# Patient Record
Sex: Male | Born: 1935 | Race: White | Hispanic: No | Marital: Married | Smoking: Former smoker
Health system: Southern US, Community
[De-identification: ages and names within clinical notes are randomized; demographics above are authoritative.]

## PROBLEM LIST (undated history)

## (undated) DIAGNOSIS — I509 Heart failure, unspecified: Secondary | ICD-10-CM

## (undated) DIAGNOSIS — Z860100 Personal history of colon polyps, unspecified: Secondary | ICD-10-CM

## (undated) DIAGNOSIS — Z87442 Personal history of urinary calculi: Secondary | ICD-10-CM

## (undated) DIAGNOSIS — C4490 Unspecified malignant neoplasm of skin, unspecified: Secondary | ICD-10-CM

## (undated) DIAGNOSIS — E785 Hyperlipidemia, unspecified: Secondary | ICD-10-CM

## (undated) DIAGNOSIS — N189 Chronic kidney disease, unspecified: Secondary | ICD-10-CM

## (undated) DIAGNOSIS — I10 Essential (primary) hypertension: Secondary | ICD-10-CM

## (undated) DIAGNOSIS — I639 Cerebral infarction, unspecified: Secondary | ICD-10-CM

## (undated) DIAGNOSIS — I5030 Unspecified diastolic (congestive) heart failure: Secondary | ICD-10-CM

## (undated) DIAGNOSIS — K219 Gastro-esophageal reflux disease without esophagitis: Secondary | ICD-10-CM

## (undated) DIAGNOSIS — Z8673 Personal history of transient ischemic attack (TIA), and cerebral infarction without residual deficits: Secondary | ICD-10-CM

## (undated) DIAGNOSIS — K5792 Diverticulitis of intestine, part unspecified, without perforation or abscess without bleeding: Secondary | ICD-10-CM

## (undated) DIAGNOSIS — C679 Malignant neoplasm of bladder, unspecified: Secondary | ICD-10-CM

## (undated) DIAGNOSIS — I739 Peripheral vascular disease, unspecified: Secondary | ICD-10-CM

## (undated) DIAGNOSIS — I2510 Atherosclerotic heart disease of native coronary artery without angina pectoris: Secondary | ICD-10-CM

## (undated) DIAGNOSIS — D649 Anemia, unspecified: Secondary | ICD-10-CM

## (undated) DIAGNOSIS — N529 Male erectile dysfunction, unspecified: Secondary | ICD-10-CM

## (undated) HISTORY — DX: Gastro-esophageal reflux disease without esophagitis: K21.9

## (undated) HISTORY — DX: Atherosclerotic heart disease of native coronary artery without angina pectoris: I25.10

## (undated) HISTORY — DX: Male erectile dysfunction, unspecified: N52.9

## (undated) HISTORY — PX: TRANSURETHRAL RESECTION OF PROSTATE: SHX73

## (undated) HISTORY — DX: Personal history of colon polyps, unspecified: Z86.0100

## (undated) HISTORY — DX: Chronic kidney disease, unspecified: N18.9

## (undated) HISTORY — DX: Personal history of colonic polyps: Z86.010

## (undated) HISTORY — DX: Essential (primary) hypertension: I10

## (undated) HISTORY — DX: Personal history of transient ischemic attack (TIA), and cerebral infarction without residual deficits: Z86.73

## (undated) HISTORY — PX: APPENDECTOMY: SHX54

## (undated) HISTORY — DX: Hyperlipidemia, unspecified: E78.5

## (undated) HISTORY — DX: Unspecified diastolic (congestive) heart failure: I50.30

## (undated) HISTORY — PX: OTHER SURGICAL HISTORY: SHX169

## (undated) HISTORY — DX: Peripheral vascular disease, unspecified: I73.9

## (undated) HISTORY — DX: Diverticulitis of intestine, part unspecified, without perforation or abscess without bleeding: K57.92

## (undated) HISTORY — PX: SKIN CANCER EXCISION: SHX779

---

## 1999-11-29 ENCOUNTER — Encounter: Admission: RE | Admit: 1999-11-29 | Discharge: 1999-11-29 | Payer: Self-pay | Admitting: *Deleted

## 1999-11-29 ENCOUNTER — Encounter: Payer: Self-pay | Admitting: *Deleted

## 2000-06-28 ENCOUNTER — Encounter: Admission: RE | Admit: 2000-06-28 | Discharge: 2000-06-28 | Payer: Self-pay | Admitting: *Deleted

## 2000-06-28 ENCOUNTER — Encounter: Payer: Self-pay | Admitting: *Deleted

## 2000-08-30 ENCOUNTER — Ambulatory Visit (HOSPITAL_COMMUNITY): Admission: RE | Admit: 2000-08-30 | Discharge: 2000-08-30 | Payer: Self-pay | Admitting: Gastroenterology

## 2001-11-19 ENCOUNTER — Encounter: Payer: Self-pay | Admitting: *Deleted

## 2001-11-19 ENCOUNTER — Encounter: Admission: RE | Admit: 2001-11-19 | Discharge: 2001-11-19 | Payer: Self-pay | Admitting: *Deleted

## 2003-09-25 ENCOUNTER — Emergency Department (HOSPITAL_COMMUNITY): Admission: EM | Admit: 2003-09-25 | Discharge: 2003-09-26 | Payer: Self-pay | Admitting: Emergency Medicine

## 2003-12-06 ENCOUNTER — Emergency Department (HOSPITAL_COMMUNITY): Admission: EM | Admit: 2003-12-06 | Discharge: 2003-12-06 | Payer: Self-pay | Admitting: Emergency Medicine

## 2003-12-15 ENCOUNTER — Inpatient Hospital Stay (HOSPITAL_COMMUNITY): Admission: RE | Admit: 2003-12-15 | Discharge: 2003-12-16 | Payer: Self-pay | Admitting: Urology

## 2005-10-06 ENCOUNTER — Emergency Department (HOSPITAL_COMMUNITY): Admission: EM | Admit: 2005-10-06 | Discharge: 2005-10-06 | Payer: Self-pay | Admitting: Emergency Medicine

## 2005-10-12 ENCOUNTER — Encounter: Admission: RE | Admit: 2005-10-12 | Discharge: 2005-10-12 | Payer: Self-pay | Admitting: Gastroenterology

## 2005-10-15 ENCOUNTER — Emergency Department (HOSPITAL_COMMUNITY): Admission: EM | Admit: 2005-10-15 | Discharge: 2005-10-15 | Payer: Self-pay | Admitting: Emergency Medicine

## 2005-12-28 ENCOUNTER — Encounter: Admission: RE | Admit: 2005-12-28 | Discharge: 2005-12-28 | Payer: Self-pay | Admitting: Internal Medicine

## 2010-07-31 ENCOUNTER — Encounter: Payer: Self-pay | Admitting: Gastroenterology

## 2013-08-08 DIAGNOSIS — Z6830 Body mass index (BMI) 30.0-30.9, adult: Secondary | ICD-10-CM | POA: Diagnosis not present

## 2013-08-08 DIAGNOSIS — Z23 Encounter for immunization: Secondary | ICD-10-CM | POA: Diagnosis not present

## 2013-08-08 DIAGNOSIS — F411 Generalized anxiety disorder: Secondary | ICD-10-CM | POA: Diagnosis not present

## 2013-08-08 DIAGNOSIS — I10 Essential (primary) hypertension: Secondary | ICD-10-CM | POA: Diagnosis not present

## 2013-08-29 DIAGNOSIS — Z789 Other specified health status: Secondary | ICD-10-CM | POA: Diagnosis not present

## 2013-08-29 DIAGNOSIS — I10 Essential (primary) hypertension: Secondary | ICD-10-CM | POA: Diagnosis not present

## 2013-08-29 DIAGNOSIS — Z6830 Body mass index (BMI) 30.0-30.9, adult: Secondary | ICD-10-CM | POA: Diagnosis not present

## 2013-10-09 DIAGNOSIS — H2510 Age-related nuclear cataract, unspecified eye: Secondary | ICD-10-CM | POA: Diagnosis not present

## 2013-10-09 DIAGNOSIS — H35349 Macular cyst, hole, or pseudohole, unspecified eye: Secondary | ICD-10-CM | POA: Diagnosis not present

## 2013-10-09 DIAGNOSIS — Z961 Presence of intraocular lens: Secondary | ICD-10-CM | POA: Diagnosis not present

## 2013-10-15 DIAGNOSIS — H2510 Age-related nuclear cataract, unspecified eye: Secondary | ICD-10-CM | POA: Diagnosis not present

## 2013-10-16 DIAGNOSIS — Z6830 Body mass index (BMI) 30.0-30.9, adult: Secondary | ICD-10-CM | POA: Diagnosis not present

## 2013-10-16 DIAGNOSIS — R109 Unspecified abdominal pain: Secondary | ICD-10-CM | POA: Diagnosis not present

## 2013-10-16 DIAGNOSIS — Z789 Other specified health status: Secondary | ICD-10-CM | POA: Diagnosis not present

## 2013-10-22 DIAGNOSIS — H2510 Age-related nuclear cataract, unspecified eye: Secondary | ICD-10-CM | POA: Diagnosis not present

## 2013-11-25 DIAGNOSIS — H6980 Other specified disorders of Eustachian tube, unspecified ear: Secondary | ICD-10-CM | POA: Diagnosis not present

## 2013-11-25 DIAGNOSIS — H908 Mixed conductive and sensorineural hearing loss, unspecified: Secondary | ICD-10-CM | POA: Diagnosis not present

## 2013-11-25 DIAGNOSIS — H902 Conductive hearing loss, unspecified: Secondary | ICD-10-CM | POA: Diagnosis not present

## 2013-11-25 DIAGNOSIS — H6120 Impacted cerumen, unspecified ear: Secondary | ICD-10-CM | POA: Diagnosis not present

## 2013-11-25 DIAGNOSIS — H9120 Sudden idiopathic hearing loss, unspecified ear: Secondary | ICD-10-CM | POA: Diagnosis not present

## 2013-11-25 DIAGNOSIS — H93299 Other abnormal auditory perceptions, unspecified ear: Secondary | ICD-10-CM | POA: Diagnosis not present

## 2013-11-25 DIAGNOSIS — H6500 Acute serous otitis media, unspecified ear: Secondary | ICD-10-CM | POA: Diagnosis not present

## 2013-11-28 ENCOUNTER — Emergency Department (HOSPITAL_COMMUNITY)
Admission: EM | Admit: 2013-11-28 | Discharge: 2013-11-28 | Disposition: A | Discharge status: Home / Self Care | Payer: Medicare Other | Attending: Emergency Medicine | Admitting: Emergency Medicine

## 2013-11-28 ENCOUNTER — Emergency Department (HOSPITAL_COMMUNITY): Payer: Medicare Other

## 2013-11-28 ENCOUNTER — Encounter (HOSPITAL_COMMUNITY): Payer: Self-pay | Admitting: Emergency Medicine

## 2013-11-28 DIAGNOSIS — Z8639 Personal history of other endocrine, nutritional and metabolic disease: Secondary | ICD-10-CM | POA: Insufficient documentation

## 2013-11-28 DIAGNOSIS — R109 Unspecified abdominal pain: Secondary | ICD-10-CM | POA: Diagnosis not present

## 2013-11-28 DIAGNOSIS — R52 Pain, unspecified: Secondary | ICD-10-CM | POA: Diagnosis not present

## 2013-11-28 DIAGNOSIS — F17200 Nicotine dependence, unspecified, uncomplicated: Secondary | ICD-10-CM | POA: Insufficient documentation

## 2013-11-28 DIAGNOSIS — G8929 Other chronic pain: Secondary | ICD-10-CM | POA: Diagnosis not present

## 2013-11-28 DIAGNOSIS — M549 Dorsalgia, unspecified: Secondary | ICD-10-CM

## 2013-11-28 DIAGNOSIS — Z87442 Personal history of urinary calculi: Secondary | ICD-10-CM | POA: Insufficient documentation

## 2013-11-28 DIAGNOSIS — Z8719 Personal history of other diseases of the digestive system: Secondary | ICD-10-CM | POA: Insufficient documentation

## 2013-11-28 DIAGNOSIS — N281 Cyst of kidney, acquired: Secondary | ICD-10-CM | POA: Diagnosis not present

## 2013-11-28 DIAGNOSIS — Z862 Personal history of diseases of the blood and blood-forming organs and certain disorders involving the immune mechanism: Secondary | ICD-10-CM | POA: Insufficient documentation

## 2013-11-28 LAB — URINALYSIS, ROUTINE W REFLEX MICROSCOPIC
Bilirubin Urine: NEGATIVE
Glucose, UA: NEGATIVE mg/dL
Hgb urine dipstick: NEGATIVE
Ketones, ur: NEGATIVE mg/dL
Leukocytes, UA: NEGATIVE
Nitrite: NEGATIVE
Protein, ur: 30 mg/dL — AB
Specific Gravity, Urine: 1.016 (ref 1.005–1.030)
Urobilinogen, UA: 0.2 mg/dL (ref 0.0–1.0)
pH: 5 (ref 5.0–8.0)

## 2013-11-28 LAB — COMPREHENSIVE METABOLIC PANEL
ALT: 18 U/L (ref 0–53)
AST: 27 U/L (ref 0–37)
Albumin: 4 g/dL (ref 3.5–5.2)
Alkaline Phosphatase: 55 U/L (ref 39–117)
BUN: 28 mg/dL — ABNORMAL HIGH (ref 6–23)
CO2: 23 mEq/L (ref 19–32)
Calcium: 9.4 mg/dL (ref 8.4–10.5)
Chloride: 98 mEq/L (ref 96–112)
Creatinine, Ser: 1.15 mg/dL (ref 0.50–1.35)
GFR calc Af Amer: 69 mL/min — ABNORMAL LOW (ref >90)
GFR calc non Af Amer: 60 mL/min — ABNORMAL LOW (ref >90)
Glucose, Bld: 157 mg/dL — ABNORMAL HIGH (ref 70–99)
Potassium: 4.4 mEq/L (ref 3.7–5.3)
Sodium: 137 mEq/L (ref 137–147)
Total Bilirubin: 0.3 mg/dL (ref 0.3–1.2)
Total Protein: 7.3 g/dL (ref 6.0–8.3)

## 2013-11-28 LAB — CBC WITH DIFFERENTIAL/PLATELET
Basophils Absolute: 0 10*3/uL (ref 0.0–0.1)
Basophils Relative: 0 % (ref 0–1)
Eosinophils Absolute: 0 10*3/uL (ref 0.0–0.7)
Eosinophils Relative: 0 % (ref 0–5)
HCT: 34.3 % — ABNORMAL LOW (ref 39.0–52.0)
Hemoglobin: 11.7 g/dL — ABNORMAL LOW (ref 13.0–17.0)
Lymphocytes Relative: 12 % (ref 12–46)
Lymphs Abs: 1.1 10*3/uL (ref 0.7–4.0)
MCH: 33.1 pg (ref 26.0–34.0)
MCHC: 34.1 g/dL (ref 30.0–36.0)
MCV: 96.9 fL (ref 78.0–100.0)
Monocytes Absolute: 0.2 10*3/uL (ref 0.1–1.0)
Monocytes Relative: 2 % — ABNORMAL LOW (ref 3–12)
Neutro Abs: 8.3 10*3/uL — ABNORMAL HIGH (ref 1.7–7.7)
Neutrophils Relative %: 86 % — ABNORMAL HIGH (ref 43–77)
Platelets: 224 10*3/uL (ref 150–400)
RBC: 3.54 MIL/uL — ABNORMAL LOW (ref 4.22–5.81)
RDW: 13.3 % (ref 11.5–15.5)
WBC: 9.7 10*3/uL (ref 4.0–10.5)

## 2013-11-28 LAB — URINE MICROSCOPIC-ADD ON

## 2013-11-28 LAB — I-STAT TROPONIN, ED: Troponin i, poc: 0.01 ng/mL (ref 0.00–0.08)

## 2013-11-28 MED ORDER — MORPHINE SULFATE 4 MG/ML IJ SOLN
6.0000 mg | INTRAMUSCULAR | Status: DC | PRN
Start: 2013-11-28 — End: 2013-11-29
  Administered 2013-11-28: 6 mg via INTRAVENOUS
  Filled 2013-11-28: qty 2

## 2013-11-28 NOTE — ED Notes (Signed)
Pt reports that he developed mid-back pain one hour before arrival. Pt also complaints of intermittent right sided abdominal pain that also developed after the back pain. Pt denies shortness of breath, nausea, emesis, dizziness, or lightheadedness. Pt is A/O x4 and in NAD.

## 2013-11-28 NOTE — ED Notes (Signed)
EKG given to EDP,Allen,MD., for review. 

## 2013-11-28 NOTE — ED Provider Notes (Signed)
CSN: [id number]     Arrival date & time 11/28/13  1932 History   First MD Initiated Contact with Patient 11/28/13 2006     Chief Complaint  Patient presents with  . Back Pain  . Abdominal Pain      HPI Patient reports developing acute mid left back pain approximately one hour before arrival.  His family reports that he stood up immediately from the table and became pale.  He also reported some intermittent left-sided abdominal pain that developed after the back pain.  Denies nausea vomiting.  No chest pain or shortness of breath.  No lightheadedness.  Patient reports the pain resolved on arrival of emergency department.  He does have a history of kidney stones.  No history of AAA   Past Medical History  Diagnosis Date  . HLD (hyperlipidemia)   . Acid reflux   . Hiatal hernia    Past Surgical History  Procedure Laterality Date  . Cataract surgery     Family History  Problem Relation Age of Onset  . Stroke Father 34  . Unexplained death Mother 22    Unknown causes   History  Substance Use Topics  . Smoking status: Current Some Day Smoker    Types: Cigars  . Smokeless tobacco: Not on file  . Alcohol Use: Yes     Comment: ocassionally    Review of Systems  All other systems reviewed and are negative.     Allergies  Review of patient's allergies indicates no known allergies.  Home Medications   Prior to Admission medications   Medication Sig Start Date End Date Taking? Authorizing Provider  atropine 1 % ophthalmic solution  10/19/13   Historical Provider, MD  clidinium-chlordiazePOXIDE (LIBRAX) 5-2.5 MG per capsule  11/18/13   Historical Provider, MD  lisinopril (PRINIVIL,ZESTRIL) 20 MG tablet  11/02/13   Historical Provider, MD  ofloxacin (OCUFLOX) 0.3 % ophthalmic solution  10/19/13   Historical Provider, MD  predniSONE (DELTASONE) 10 MG tablet  11/25/13   Historical Provider, MD  PROLENSA 0.07 % SOLN  10/20/13   Historical Provider, MD   BP 135/66  Pulse 57   Temp(Src) 97.9 F (36.6 C) (Oral)  Resp 13  SpO2 96% Physical Exam  Nursing note and vitals reviewed. Constitutional: He is oriented to person, place, and time. He appears well-developed and well-nourished.  HENT:  Head: Normocephalic and atraumatic.  Eyes: EOM are normal.  Neck: Normal range of motion.  Cardiovascular: Normal rate, regular rhythm, normal heart sounds and intact distal pulses.   Pulmonary/Chest: Effort normal and breath sounds normal. No respiratory distress.  Abdominal: Soft. He exhibits no distension. There is no tenderness.  Musculoskeletal: Normal range of motion.  Neurological: He is alert and oriented to person, place, and time.  Skin: Skin is warm and dry.  Psychiatric: He has a normal mood and affect. Judgment normal.    ED Course  Procedures (including critical care time)  EMERGENCY DEPARTMENT [place] ABD/AORTA EXAM Study: Limited Ultrasound of the Abdominal Aorta. INDICATIONS:Abdominal pain and Back pain Indication: Multiple views of the abdominal aorta are obtained from the diaphragmatic hiatus to the aortic bifurcation in transverse and sagittal planes with a multi- Frequency probe. PERFORMED BY: Myself IMAGES ARCHIVED?: Yes FINDINGS: Maximum aortic dimensions are 2.7cm LIMITATIONS:  none INTERPRETATION:  No abdominal aortic aneurysm COMMENT:    Labs Review Labs Reviewed  CBC WITH DIFFERENTIAL - Abnormal; Notable for the following:    RBC 3.54 (*)    Hemoglobin 11.7 (*)  HCT 34.3 (*)    Neutrophils Relative % 86 (*)    Neutro Abs 8.3 (*)    Monocytes Relative 2 (*)    All other components within normal limits  COMPREHENSIVE METABOLIC PANEL - Abnormal; Notable for the following:    Glucose, Bld 157 (*)    BUN 28 (*)    GFR calc non Af Amer 60 (*)    GFR calc Af Amer 69 (*)    All other components within normal limits  URINALYSIS, ROUTINE W REFLEX MICROSCOPIC - Abnormal; Notable for the following:    Protein, ur 30 (*)    All other  components within normal limits  URINE MICROSCOPIC-ADD ON  I-STAT TROPOININ, ED    Imaging Review Ct Abdomen Pelvis Wo Contrast  11/28/2013   CLINICAL DATA:  Back pain. Left flank pain. History of kidney stones.  EXAM: CT ABDOMEN AND PELVIS WITHOUT CONTRAST  TECHNIQUE: Multidetector CT imaging of the abdomen and pelvis was performed following the standard protocol without IV contrast.  COMPARISON:  CT abdomen and pelvis with contrast 10/12/2005  FINDINGS: Lung bases: Coronary artery atherosclerotic calcifications. Borderline cardiomegaly. Mild bibasilar atelectasis. No evidence for pleural or pericardial effusion.  There is mild atrophy of the right kidney. Negative for stone or hydronephrosis on the right. Mild nonspecific perinephric stranding on the right appears similar prior exam.  No evidence of atrophy of the left kidney. Small exophytic cyst midpole left kidney appears similar to prior exam. Peripherally calcified cyst extending eccentrically from the lower pole the left kidney has significantly decreased in size since the CT of 2007. Currently it measures 4.2 cm greatest diameter. Previously, in 2007, it measured 8.4 cm in greatest diameter. Negative forearm stone or hydronephrosis on the left.  Both ureters are normal in caliber. Negative for ureteral stone or periureteric stranding. The urinary bladder is not very distended at the time imaging and is normal. Normal size prostate gland.  Noncontrast appearance of the liver, gallbladder, spleen, adrenal glands, and pancreas is within normal limits.  Stomach and small bowel loops are normal. Moderate amount of stool in the colon. No evidence of ball thickening or obstruction.  Heavy atherosclerotic calcification of the abdominal aorta and iliac vasculature without aneurysm. There is atherosclerotic calcification scattered in the superior mesenteric artery. Atherosclerotic calcification of the origin of both renal arteries.  The mesenteries appear  normal. Negative for lymphadenopathy, free fluid, or free air.  There is disc height narrowing with posterior disc bulge at L5-S1. Vertebral bodies are normal in height and alignment. No acute or suspicious bony abnormality.  IMPRESSION: 1. Negative for urinary tract stone disease or  obstruction. 2. Left renal cyst. Calcified lower pole renal cyst has significantly decreased in size since prior study of 2007. 3. Extensive atherosclerosis without aneurysm. 4. No definite acute findings identified in the abdomen or pelvis. 5. Degenerative disc disease at L5-S1.   Electronically Signed   By: Curlene Dolphin M.D.   On: 11/28/2013 21:14  I personally reviewed the imaging tests through PACS system I reviewed available ER/hospitalization records through the EMR    EKG Interpretation   Date/Time:  Friday Nov 28 2013 19:40:35 EDT Ventricular Rate:  73 PR Interval:  175 QRS Duration: 103 QT Interval:  393 QTC Calculation: 433 R Axis:   38 Text Interpretation:  Sinus rhythm Atrial premature complex Minimal ST  elevation, inferior leads Baseline wander in lead(s) V2 No significant  change was found Confirmed by Ahnesti Townsend  MD, Thia Olesen ([id number]) on  11/28/2013  8:26:43 PM      MDM   Final diagnoses:  Acute back pain    10:07 PM Patient feels much better at this time. Discharge home in good condition.  His wife does report that he did a bunch of yard work today.  His may represented acute muscle spasm that resolved on arrival to emergency department.  Also may represented recently passed left ureteral stone prior to CT imaging.  Patient feels better.  No complaints at this time.  Repeat abdominal exam is benign.  Discharge home in good condition.    Hoy Morn, MD 11/28/13 2207

## 2013-12-16 DIAGNOSIS — H908 Mixed conductive and sensorineural hearing loss, unspecified: Secondary | ICD-10-CM | POA: Diagnosis not present

## 2013-12-16 DIAGNOSIS — H6500 Acute serous otitis media, unspecified ear: Secondary | ICD-10-CM | POA: Diagnosis not present

## 2013-12-25 DIAGNOSIS — H6520 Chronic serous otitis media, unspecified ear: Secondary | ICD-10-CM | POA: Diagnosis not present

## 2014-01-06 ENCOUNTER — Other Ambulatory Visit: Payer: Self-pay | Admitting: Dermatology

## 2014-01-06 DIAGNOSIS — L821 Other seborrheic keratosis: Secondary | ICD-10-CM | POA: Diagnosis not present

## 2014-01-06 DIAGNOSIS — Z85828 Personal history of other malignant neoplasm of skin: Secondary | ICD-10-CM | POA: Diagnosis not present

## 2014-01-06 DIAGNOSIS — D2330 Other benign neoplasm of skin of unspecified part of face: Secondary | ICD-10-CM | POA: Diagnosis not present

## 2014-01-06 DIAGNOSIS — L570 Actinic keratosis: Secondary | ICD-10-CM | POA: Diagnosis not present

## 2014-01-19 DIAGNOSIS — H9120 Sudden idiopathic hearing loss, unspecified ear: Secondary | ICD-10-CM | POA: Diagnosis not present

## 2014-01-19 DIAGNOSIS — H6520 Chronic serous otitis media, unspecified ear: Secondary | ICD-10-CM | POA: Diagnosis not present

## 2014-01-19 DIAGNOSIS — H905 Unspecified sensorineural hearing loss: Secondary | ICD-10-CM | POA: Diagnosis not present

## 2014-01-19 DIAGNOSIS — H903 Sensorineural hearing loss, bilateral: Secondary | ICD-10-CM | POA: Diagnosis not present

## 2014-01-19 DIAGNOSIS — H908 Mixed conductive and sensorineural hearing loss, unspecified: Secondary | ICD-10-CM | POA: Diagnosis not present

## 2014-02-23 DIAGNOSIS — Z961 Presence of intraocular lens: Secondary | ICD-10-CM | POA: Diagnosis not present

## 2014-04-01 DIAGNOSIS — Z23 Encounter for immunization: Secondary | ICD-10-CM | POA: Diagnosis not present

## 2014-07-07 DIAGNOSIS — L570 Actinic keratosis: Secondary | ICD-10-CM | POA: Diagnosis not present

## 2014-07-07 DIAGNOSIS — D1801 Hemangioma of skin and subcutaneous tissue: Secondary | ICD-10-CM | POA: Diagnosis not present

## 2014-07-07 DIAGNOSIS — L821 Other seborrheic keratosis: Secondary | ICD-10-CM | POA: Diagnosis not present

## 2014-07-07 DIAGNOSIS — D225 Melanocytic nevi of trunk: Secondary | ICD-10-CM | POA: Diagnosis not present

## 2014-07-07 DIAGNOSIS — Z85828 Personal history of other malignant neoplasm of skin: Secondary | ICD-10-CM | POA: Diagnosis not present

## 2014-07-14 DIAGNOSIS — H6981 Other specified disorders of Eustachian tube, right ear: Secondary | ICD-10-CM | POA: Diagnosis not present

## 2014-07-14 DIAGNOSIS — H905 Unspecified sensorineural hearing loss: Secondary | ICD-10-CM | POA: Diagnosis not present

## 2014-07-17 DIAGNOSIS — E785 Hyperlipidemia, unspecified: Secondary | ICD-10-CM | POA: Diagnosis not present

## 2014-07-17 DIAGNOSIS — Z6830 Body mass index (BMI) 30.0-30.9, adult: Secondary | ICD-10-CM | POA: Diagnosis not present

## 2014-07-17 DIAGNOSIS — I10 Essential (primary) hypertension: Secondary | ICD-10-CM | POA: Diagnosis not present

## 2014-11-19 DIAGNOSIS — H6121 Impacted cerumen, right ear: Secondary | ICD-10-CM | POA: Diagnosis not present

## 2014-11-19 DIAGNOSIS — H905 Unspecified sensorineural hearing loss: Secondary | ICD-10-CM | POA: Diagnosis not present

## 2015-03-22 DIAGNOSIS — Z961 Presence of intraocular lens: Secondary | ICD-10-CM | POA: Diagnosis not present

## 2015-03-22 DIAGNOSIS — H35342 Macular cyst, hole, or pseudohole, left eye: Secondary | ICD-10-CM | POA: Diagnosis not present

## 2015-04-20 ENCOUNTER — Ambulatory Visit (INDEPENDENT_AMBULATORY_CARE_PROVIDER_SITE_OTHER): Payer: Medicare Other | Admitting: Family

## 2015-04-20 ENCOUNTER — Other Ambulatory Visit (INDEPENDENT_AMBULATORY_CARE_PROVIDER_SITE_OTHER): Payer: Medicare Other

## 2015-04-20 ENCOUNTER — Encounter: Payer: Self-pay | Admitting: Family

## 2015-04-20 VITALS — BP 142/88 | HR 61 | Temp 97.6°F | Resp 18 | Ht 64.0 in | Wt 166.4 lb

## 2015-04-20 DIAGNOSIS — I10 Essential (primary) hypertension: Secondary | ICD-10-CM | POA: Diagnosis not present

## 2015-04-20 DIAGNOSIS — Z0000 Encounter for general adult medical examination without abnormal findings: Secondary | ICD-10-CM | POA: Diagnosis not present

## 2015-04-20 DIAGNOSIS — N529 Male erectile dysfunction, unspecified: Secondary | ICD-10-CM | POA: Diagnosis not present

## 2015-04-20 HISTORY — DX: Male erectile dysfunction, unspecified: N52.9

## 2015-04-20 HISTORY — DX: Essential (primary) hypertension: I10

## 2015-04-20 LAB — CBC
HCT: 39.3 % (ref 39.0–52.0)
Hemoglobin: 13.3 g/dL (ref 13.0–17.0)
MCHC: 33.7 g/dL (ref 30.0–36.0)
MCV: 97.8 fl (ref 78.0–100.0)
Platelets: 209 10*3/uL (ref 150.0–400.0)
RBC: 4.02 Mil/uL — ABNORMAL LOW (ref 4.22–5.81)
RDW: 14 % (ref 11.5–15.5)
WBC: 6.7 10*3/uL (ref 4.0–10.5)

## 2015-04-20 LAB — TSH: TSH: 1.96 u[IU]/mL (ref 0.35–4.50)

## 2015-04-20 MED ORDER — TADALAFIL 5 MG PO TABS: 5.0000 mg | ORAL_TABLET | Freq: Every day | ORAL | Status: DC | PRN

## 2015-04-20 NOTE — Assessment & Plan Note (Signed)
Previously diagnosed with erectile dysfunction and maintained on Cialis. Requests a refill of Cialis.

## 2015-04-20 NOTE — Progress Notes (Signed)
Pre visit review using our clinic review tool, if applicable. No additional management support is needed unless otherwise documented below in the visit note. 

## 2015-04-20 NOTE — Assessment & Plan Note (Addendum)
Reviewed and updated patient's medical, surgical, family and social history. Medications and allergies were also reviewed. Basic screenings for depression, activities of daily living, hearing, cognition and safety were performed. Provider list was updated and health plan was provided to the patient.   1) Anticipatory Guidance: Discussed importance of wearing a seatbelt while driving and not texting while driving; changing batteries in smoke detector at least once annually; wearing suntan lotion when outside; eating a balanced and moderate diet; getting physical activity at least 30 minutes per day.  2) Immunizations / Screenings / Labs:  Flu shot updated today. Due for shingles, Prevnar, and Pneumovax. Developed treatment plan to provide all immunizations per patient request. Start with Prevnar. All other immunizations are up-to-date per  recommendations. Obtain CBC, CMET, Lipid profile and TSH.   Overall well exam with cardiovascular risk ractors as identified. Recommend increasing physical activity to 30 minutes most days of the week. Hypertension is adequately controlled with current regimen and below goal of 150/90. Follow up prevention exam in 1 year. Follow up office visit pending blood work.

## 2015-04-20 NOTE — Patient Instructions (Signed)
Thank you for choosing [facility].  Summary/Instructions:  Please schedule an appointment for a nurse visit for your Prevnar (pneumonia) vaccination.  Your prescription(s) have been submitted to your pharmacy or been printed and provided for you. Please take as directed and contact our office if you believe you are having problem(s) with the medication(s) or have any questions.  Please stop by the lab on the basement level of the building for your blood work. Your results will be released to [place] (or called to you) after review, usually within 72 hours after test completion. If any changes need to be made, you will be notified at that same time.  Health Maintenance  Topic Date Due  . TETANUS/TDAP  04/26/1955  . ZOSTAVAX  04/25/1996  . PNA vac Low Risk Adult (1 of 2 - PCV13) 04/25/2001  . INFLUENZA VACCINE  02/08/2015    Health Maintenance, Male A healthy lifestyle and preventative care can promote health and wellness.  Maintain regular health, dental, and eye exams.  Eat a healthy diet. Foods like vegetables, fruits, whole grains, low-fat dairy products, and lean protein foods contain the nutrients you need and are low in calories. Decrease your intake of foods high in solid fats, added sugars, and salt. Get information about a proper diet from your health care provider, if necessary.  Regular physical exercise is one of the most important things you can do for your health. Most adults should get at least 150 minutes of moderate-intensity exercise (any activity that increases your heart rate and causes you to sweat) each week. In addition, most adults need muscle-strengthening exercises on 2 or more days a week.   Maintain a healthy weight. The body mass index (BMI) is a screening tool to identify possible weight problems. It provides an estimate of body fat based on height and weight. Your health care provider can find your BMI and can help you achieve or maintain a healthy  weight. For males 20 years and older:  A BMI below 18.5 is considered underweight.  A BMI of 18.5 to 24.9 is normal.  A BMI of 25 to 29.9 is considered overweight.  A BMI of 30 and above is considered obese.  Maintain normal blood lipids and cholesterol by exercising and minimizing your intake of saturated fat. Eat a balanced diet with plenty of fruits and vegetables. Blood tests for lipids and cholesterol should begin at age 63 and be repeated every 5 years. If your lipid or cholesterol levels are high, you are over age 70, or you are at high risk for heart disease, you may need your cholesterol levels checked more frequently.Ongoing high lipid and cholesterol levels should be treated with medicines if diet and exercise are not working.  If you smoke, find out from your health care provider how to quit. If you do not use tobacco, do not start.  Lung cancer screening is recommended for adults aged 48-80 years who are at high risk for developing lung cancer because of a history of smoking. A yearly low-dose CT scan of the lungs is recommended for people who have at least a 30-pack-year history of smoking and are current smokers or have quit within the past 15 years. A pack year of smoking is smoking an average of 1 pack of cigarettes a day for 1 year (for example, a 30-pack-year history of smoking could mean smoking 1 pack a day for 30 years or 2 packs a day for 15 years). Yearly screening should continue until the  smoker has stopped smoking for at least 15 years. Yearly screening should be stopped for people who develop a health problem that would prevent them from having lung cancer treatment.  If you choose to drink alcohol, do not have more than 2 drinks per day. One drink is considered to be 12 oz (360 mL) of beer, 5 oz (150 mL) of wine, or 1.5 oz (45 mL) of liquor.  Avoid the use of street drugs. Do not share needles with anyone. Ask for help if you need support or instructions about stopping  the use of drugs.  High blood pressure causes heart disease and increases the risk of stroke. High blood pressure is more likely to develop in:  People who have blood pressure in the end of the normal range (100-139/85-89 mm Hg).  People who are overweight or obese.  People who are African American.  If you are 93-76 years of age, have your blood pressure checked every 3-5 years. If you are 62 years of age or older, have your blood pressure checked every year. You should have your blood pressure measured twice--once when you are at a hospital or clinic, and once when you are not at a hospital or clinic. Record the average of the two measurements. To check your blood pressure when you are not at a hospital or clinic, you can use:  An automated blood pressure machine at a pharmacy.  A home blood pressure monitor.  If you are 58-75 years old, ask your health care provider if you should take aspirin to prevent heart disease.  Diabetes screening involves taking a blood sample to check your fasting blood sugar level. This should be done once every 3 years after age 13 if you are at a normal weight and without risk factors for diabetes. Testing should be considered at a younger age or be carried out more frequently if you are overweight and have at least 1 risk factor for diabetes.  Colorectal cancer can be detected and often prevented. Most routine colorectal cancer screening begins at the age of 14 and continues through age 20. However, your health care provider may recommend screening at an earlier age if you have risk factors for colon cancer. On a yearly basis, your health care provider may provide home test kits to check for hidden blood in the stool. A small camera at the end of a tube may be used to directly examine the colon (sigmoidoscopy or colonoscopy) to detect the earliest forms of colorectal cancer. Talk to your health care provider about this at age 45 when routine screening begins. A  direct exam of the colon should be repeated every 5-10 years through age 65, unless early forms of precancerous polyps or small growths are found.  People who are at an increased risk for hepatitis B should be screened for this virus. You are considered at high risk for hepatitis B if:  You were born in a country where hepatitis B occurs often. Talk with your health care provider about which countries are considered high risk.  Your parents were born in a high-risk country and you have not received a shot to protect against hepatitis B (hepatitis B vaccine).  You have HIV or AIDS.  You use needles to inject street drugs.  You live with, or have sex with, someone who has hepatitis B.  You are a man who has sex with other men (MSM).  You get hemodialysis treatment.  You take certain medicines for conditions like  cancer, organ transplantation, and autoimmune conditions.  Hepatitis C blood testing is recommended for all people born from [birth year] through 1963 and any individual with known risk factors for hepatitis C.  Healthy men should no longer receive prostate-specific antigen (PSA) blood tests as part of routine cancer screening. Talk to your health care provider about prostate cancer screening.  Testicular cancer screening is not recommended for adolescents or adult males who have no symptoms. Screening includes self-exam, a health care provider exam, and other screening tests. Consult with your health care provider about any symptoms you have or any concerns you have about testicular cancer.  Practice safe sex. Use condoms and avoid high-risk sexual practices to reduce the spread of sexually transmitted infections (STIs).  You should be screened for STIs, including gonorrhea and chlamydia if:  You are sexually active and are younger than 24 years.  You are older than 24 years, and your health care provider tells you that you are at risk for this type of infection.  Your sexual  activity has changed since you were last screened, and you are at an increased risk for chlamydia or gonorrhea. Ask your health care provider if you are at risk.  If you are at risk of being infected with HIV, it is recommended that you take a prescription medicine daily to prevent HIV infection. This is called pre-exposure prophylaxis (PrEP). You are considered at risk if:  You are a man who has sex with other men (MSM).  You are a heterosexual man who is sexually active with multiple partners.  You take drugs by injection.  You are sexually active with a partner who has HIV.  Talk with your health care provider about whether you are at high risk of being infected with HIV. If you choose to begin PrEP, you should first be tested for HIV. You should then be tested every 3 months for as long as you are taking PrEP.  Use sunscreen. Apply sunscreen liberally and repeatedly throughout the day. You should seek shade when your shadow is shorter than you. Protect yourself by wearing long sleeves, pants, a wide-brimmed hat, and sunglasses year round whenever you are outdoors.  Tell your health care provider of new moles or changes in moles, especially if there is a change in shape or color. Also, tell your health care provider if a mole is larger than the size of a pencil eraser.  A one-time screening for abdominal aortic aneurysm (AAA) and surgical repair of large AAAs by ultrasound is recommended for men aged 63-75 years who are current or former smokers.  Stay current with your vaccines (immunizations).   This information is not intended to replace advice given to you by your health care provider. Make sure you discuss any questions you have with your health care provider.   Document Released: 12/23/2007 Document Revised: 07/17/2014 Document Reviewed: 11/21/2010 Elsevier Interactive Patient Education [employer].

## 2015-04-20 NOTE — Progress Notes (Signed)
Subjective:    Patient ID: Danny [name], male    DOB: Nov 16, 1933, 81 y.o.   MRN: [id number]  Chief Complaint  Patient presents with  . Establish Care    CPE     HPI:  Danny [name] is a 81 y.o. male who presents today for an annual wellness visit.   1) Health Maintenance -   Diet - Averages about 1-3 meals per day consisting of fruits, vegetables, seldom red meat, chicken, [place], and occasional milk. 1-2 cups of caffeine daily.  Exercise - Yard work and house work. No structured exercise.   2) Preventative Exams / Immunizations:  Dental -- Dentures / about 10 years ago  Vision -- Up to date   Health Maintenance  Topic Date Due  . TETANUS/TDAP  04/26/1955  . ZOSTAVAX  04/25/1996  . PNA vac Low Risk Adult (1 of 2 - PCV13) 04/25/2001  . INFLUENZA VACCINE  02/08/2015  Will schedule Prevnar/Shingles    There is no immunization history on file for this patient.   RISK FACTORS  Tobacco History  Smoking status  . Former Smoker  . Types: Cigars  Smokeless tobacco  . Never Used     Cardiac risk factors: advanced age (older than 51 for men, 60 for women), hypertension, male gender and sedentary lifestyle.  Depression Screen  Q1: Over the past two weeks, have you felt down, depressed or hopeless? No  Q2: Over the past two weeks, have you felt little interest or pleasure in doing things? No  Have you lost interest or pleasure in daily life? No  Do you often feel hopeless? No  Do you cry easily over simple problems? No  Activities of Daily Living In your present state of health, do you have any difficulty performing the following activities?:  Driving? No Managing money?  No Feeding yourself? No Getting from bed to chair? No Climbing a flight of stairs? No Preparing food and eating?: No Bathing or showering? No Getting dressed: No Getting to the toilet? No Using the toilet: No Moving around from place to place: No In the past year have you fallen or  had a near fall?:No   Home Safety Has smoke detector and wears seat belts. No firearms. No excess sun exposure. Are there smokers in your home (other than you)?  No Do you feel safe at home?  Yes  Hearing Difficulties: No Do you often ask people to speak up or repeat themselves? No Do you experience ringing or noises in your ears? No  Do you have difficulty understanding soft or whispered voices? No    Cognitive Testing  Alert? Yes   Normal Appearance? Yes  Oriented to person? Yes  Place? Yes   Time? Yes  Recall of three objects?  Yes  Can perform simple calculations? Yes  Displays appropriate judgment? Yes  Can read the correct time from a watch face? Yes  Do you feel that you have a problem with memory? No  Do you often misplace items? No   Advanced Directives have been discussed with the patient? Yes  Current Physicians/Providers and Suppliers  1. Terri Piedra, FNP - Internal medicine  Indicate any recent Medical Services you may have received from other than [facility] providers in the past year (date may be approximate).  All answers were reviewed with the patient and necessary referrals were made:  Mauricio Po, [place]   04/20/2015    No Known Allergies   Outpatient Prescriptions Prior to Visit  Medication Sig Dispense Refill  . clidinium-chlordiazePOXIDE (LIBRAX) 5-2.5 MG per capsule Take 1 capsule by mouth 3 (three) times daily before meals.     Marland Kitchen lisinopril (PRINIVIL,ZESTRIL) 20 MG tablet Take 20 mg by mouth daily.     . predniSONE (DELTASONE) 10 MG tablet Take 10-60 mg by mouth See admin instructions. Prednisone dose pack 60 for five days then 50mg  for five days ; 40mg  for five days; 30mg  for five days ; 20mg  for five days; 10 mg for five days     No facility-administered medications prior to visit.     Past Medical History  Diagnosis Date  . HLD (hyperlipidemia)   . Hypertension   . Kidney stones   . History of colon polyps   . Diverticulitis   . Acid  reflux     hiatal hernia     Past Surgical History  Procedure Laterality Date  . Cataract surgery       Family History  Problem Relation Age of Onset  . Stroke Father 28  . Unexplained death Mother 39    Unknown causes     Social History   Social History  . Marital Status: Married    Spouse Name: N/A  . Number of Children: 1  . Years of Education: 12   Occupational History  . Retired    Social History Main Topics  . Smoking status: Former Smoker    Types: Cigars  . Smokeless tobacco: Never Used  . Alcohol Use: No  . Drug Use: No  . Sexual Activity: Not on file   Other Topics Concern  . Not on file   Social History Narrative     Review of Systems  Constitutional: Denies fever, chills, fatigue, or significant weight gain/loss. HENT: Head: Denies headache or neck pain Ears: Denies changes in hearing, ringing in ears, earache, drainage Nose: Denies discharge, stuffiness, itching, nosebleed, sinus pain Throat: Denies sore throat, hoarseness, dry mouth, sores, thrush Eyes: Denies loss/changes in vision, pain, redness, blurry/double vision, flashing lights Cardiovascular: Denies chest pain/discomfort, tightness, palpitations, shortness of breath with activity, difficulty lying down, swelling, sudden awakening with shortness of breath Respiratory: Denies shortness of breath, cough, sputum production, wheezing Gastrointestinal: Denies dysphasia, heartburn, change in appetite, nausea, change in bowel habits, rectal bleeding, constipation, diarrhea, yellow skin or eyes Genitourinary: Denies frequency, urgency, burning/pain, blood in urine, incontinence, change in urinary strength. Musculoskeletal: Denies muscle/joint pain, stiffness, back pain, redness or swelling of joints, trauma Skin: Denies rashes, lumps, itching, dryness, color changes, or hair/nail changes Neurological: Denies dizziness, fainting, seizures, weakness, numbness, tingling, tremor Psychiatric -  Denies nervousness, stress, depression or memory loss Endocrine: Denies heat or cold intolerance, sweating, frequent urination, excessive thirst, changes in appetite Hematologic: Denies ease of bruising or bleeding    Objective:     BP 142/88 mmHg  Pulse 61  Temp(Src) 97.6 F (36.4 C) (Oral)  Resp 18  Ht 5\' 4"  (1.626 m)  Wt 166 lb 6.4 oz (75.479 kg)  BMI 28.55 kg/m2  SpO2 97% Nursing note and vital signs reviewed.  Physical Exam  Constitutional: He is oriented to person, place, and time. He appears well-developed and well-nourished. No distress.  Cardiovascular: Normal rate, regular rhythm, normal heart sounds and intact distal pulses.   Pulmonary/Chest: Effort normal and breath sounds normal.  Neurological: He is alert and oriented to person, place, and time.  Skin: Skin is warm and dry.  Psychiatric: He has a normal mood and affect. His behavior is normal. Judgment  and thought content normal.       Assessment & Plan:   During the course of the visit the patient was educated and counseled about appropriate screening and preventive services including:    Pneumococcal vaccine   Influenza vaccine  Td vaccine  Prostate cancer screening  Colorectal cancer screening  Diabetes screening  Glaucoma screening  Nutrition counseling   Diet review for nutrition referral? Yes ____  Not Indicated _X___   Patient Instructions (the written plan) was given to the patient.  Medicare Attestation I have personally reviewed: The patient's medical and social history Their use of alcohol, tobacco or illicit drugs Their current medications and supplements The patient's functional ability including ADLs,fall risks, home safety risks, cognitive, and hearing and visual impairment Diet and physical activities Evidence for depression or mood disorders  The patient's weight, height, BMI,  have been recorded in the chart.  I have made referrals, counseling, and provided education to  the patient based on review of the above and I have provided the patient with a written personalized care plan for preventive services.     Mauricio Po, [place]   04/20/2015    Problem List Items Addressed This Visit      Genitourinary   Erectile dysfunction    Previously diagnosed with erectile dysfunction and maintained on Cialis. Requests a refill of Cialis.       Relevant Medications   tadalafil (CIALIS) 5 MG tablet     Other   Medicare annual wellness visit, subsequent - Primary    Reviewed and updated patient's medical, surgical, family and social history. Medications and allergies were also reviewed. Basic screenings for depression, activities of daily living, hearing, cognition and safety were performed. Provider list was updated and health plan was provided to the patient.   1) Anticipatory Guidance: Discussed importance of wearing a seatbelt while driving and not texting while driving; changing batteries in smoke detector at least once annually; wearing suntan lotion when outside; eating a balanced and moderate diet; getting physical activity at least 30 minutes per day.  2) Immunizations / Screenings / Labs:  Flu shot updated today. Due for shingles, Prevnar, and Pneumovax. Developed treatment plan to provide all immunizations per patient request. Start with Prevnar. All other immunizations are up-to-date per  recommendations. Obtain CBC, CMET, Lipid profile and TSH.   Overall well exam with cardiovascular risk ractors as identified. Recommend increasing physical activity to 30 minutes most days of the week. Hypertension is adequately controlled with current regimen and below goal of 150/90. Follow up prevention exam in 1 year. Follow up office visit pending blood work.         Other Visit Diagnoses    Essential hypertension        Relevant Medications    aspirin 81 MG tablet    tadalafil (CIALIS) 5 MG tablet    Other Relevant Orders    Comprehensive metabolic panel      CBC    Lipid panel    TSH

## 2015-04-21 ENCOUNTER — Telehealth: Payer: Self-pay | Admitting: Family

## 2015-04-21 LAB — LIPID PANEL
Cholesterol: 298 mg/dL — ABNORMAL HIGH (ref 0–200)
HDL: 47.5 mg/dL (ref >39.00)
LDL Cholesterol: 221 mg/dL — ABNORMAL HIGH (ref 0–99)
NonHDL: 250.99
Total CHOL/HDL Ratio: 6
Triglycerides: 149 mg/dL (ref 0.0–149.0)
VLDL: 29.8 mg/dL (ref 0.0–40.0)

## 2015-04-21 LAB — COMPREHENSIVE METABOLIC PANEL
ALT: 12 U/L (ref 0–53)
AST: 19 U/L (ref 0–37)
Albumin: 4.2 g/dL (ref 3.5–5.2)
Alkaline Phosphatase: 60 U/L (ref 39–117)
BUN: 15 mg/dL (ref 6–23)
CO2: 30 mEq/L (ref 19–32)
Calcium: 9.7 mg/dL (ref 8.4–10.5)
Chloride: 103 mEq/L (ref 96–112)
Creatinine, Ser: 0.91 mg/dL (ref 0.40–1.50)
GFR: 85.42 mL/min (ref >60.00)
Glucose, Bld: 95 mg/dL (ref 70–99)
Potassium: 4.9 mEq/L (ref 3.5–5.1)
Sodium: 139 mEq/L (ref 135–145)
Total Bilirubin: 0.6 mg/dL (ref 0.2–1.2)
Total Protein: 7.3 g/dL (ref 6.0–8.3)

## 2015-04-21 NOTE — Telephone Encounter (Signed)
Please inform patient that his blood work shows that his white/red blood cells, kidney function, liver function, electrolytes, and thyroid are all within the normal ranges. His cholesterol levels were significantly elevated and is recommended that he start cholesterol medication. With his agreement I will send in Lipitor to his pharmacy. If he experiences muscle pain with this medication then we will need to make changes.

## 2015-04-22 NOTE — Telephone Encounter (Signed)
Would like to be back on cholesterol medication can not take lipitor. Had muscle pain. Would like to see if you could send something reasonably priced. Please advise

## 2015-04-23 NOTE — Telephone Encounter (Signed)
Please have the patient check his drug formulary for options that are covered and approved by his insurance.

## 2015-05-04 ENCOUNTER — Ambulatory Visit: Payer: Medicare Other

## 2015-05-04 DIAGNOSIS — Z23 Encounter for immunization: Secondary | ICD-10-CM

## 2015-05-06 NOTE — Telephone Encounter (Signed)
There is a generic crestor now. Are you interested in the pt trying that?

## 2015-05-10 NOTE — Telephone Encounter (Signed)
What is covered on his formulary as he as had reactions to statins in the past.

## 2015-05-27 NOTE — Progress Notes (Signed)
Cardiology Office Note   Date:  05/28/2015   ID:  Danny [name], DOB [birth year], MRN [id number]  PCP:  Danny Po, FNP  Cardiologist:   Thayer Headings, MD   Chief Complaint  Patient presents with  . Hypertension   Problem List  1. HTN 2. Hyperlipidemia    History of Present Illness: Danny [name] is a 81 y.o. male who presents to re-establish care. I saw him many years ago . Has a hx of HTN and hyperlipidema.  Mows yards, does lots of yard work .    Past Medical History  Diagnosis Date  . HLD (hyperlipidemia)   . Hypertension   . Kidney stones   . History of colon polyps   . Diverticulitis   . Acid reflux     hiatal hernia    Past Surgical History  Procedure Laterality Date  . Cataract surgery       Current Outpatient Prescriptions  Medication Sig Dispense Refill  . aspirin 81 MG tablet Take 81 mg by mouth daily.    . clidinium-chlordiazePOXIDE (LIBRAX) 5-2.5 MG per capsule Take 1 capsule by mouth 3 (three) times daily before meals.     . L-ARGININE [name] Take 850 mg by mouth daily.     . Milk Thistle 500 MG CAPS Take 500 mg by mouth daily.     . tadalafil (CIALIS) 5 MG tablet Take 1 tablet (5 mg total) by mouth daily as needed for erectile dysfunction. 5 tablet 0  . Turmeric 450 MG CAPS Take 720 mg by mouth daily.     Marland Kitchen zoster vaccine live, PF, (ZOSTAVAX) 55374 UNT/0.65ML injection Inject as directed once.    Marland Kitchen losartan (COZAAR) 100 MG tablet Take 1 tablet (100 mg total) by mouth daily. 31 tablet 11  . rosuvastatin (CRESTOR) 10 MG tablet Take 1 tablet (10 mg total) by mouth daily. 31 tablet 11   No current facility-administered medications for this visit.    Allergies:   Lipitor    Social History:  The patient  reports that he has quit smoking. His smoking use included Cigars. He has never used smokeless tobacco. He reports that he does not drink alcohol or use illicit drugs.   Family History:  The patient's family history includes Stroke (age  of onset: 51) in his father; Unexplained death (age of onset: 51) in his mother.    ROS:  Please see the history of present illness.    Review of Systems: Constitutional:  denies fever, chills, diaphoresis, appetite change and fatigue.  HEENT: denies photophobia, eye pain, redness, hearing loss, ear pain, congestion, sore throat, rhinorrhea, sneezing, neck pain, neck stiffness and tinnitus.  Respiratory: denies SOB, DOE, cough, chest tightness, and wheezing.  Cardiovascular: denies chest pain, palpitations and leg swelling.  Gastrointestinal: denies nausea, vomiting, abdominal pain, diarrhea, constipation, blood in stool.  Genitourinary: denies dysuria, urgency, frequency, hematuria, flank pain and difficulty urinating.  Musculoskeletal: denies  myalgias, back pain, joint swelling, arthralgias and gait problem.   Skin: denies pallor, rash and wound.  Neurological: denies dizziness, seizures, syncope, weakness, light-headedness, numbness and headaches.   Hematological: denies adenopathy, easy bruising, personal or family bleeding history.  Psychiatric/ Behavioral: denies suicidal ideation, mood changes, confusion, nervousness, sleep disturbance and agitation.       All other systems are reviewed and negative.    PHYSICAL EXAM: VS:  BP 164/82 mmHg  Pulse 65  Ht 5' 4"  (1.626 m)  Wt 170 lb 9.6 oz (77.384 kg)  BMI 29.27 kg/m2 , BMI Body mass index is 29.27 kg/(m^2). GEN: Well nourished, well developed, in no acute distress HEENT: normal Neck: no JVD, carotid bruits, or masses Cardiac: RRR; no murmurs, rubs, or gallops,no edema  Respiratory:  clear to auscultation bilaterally, normal work of breathing GI: soft, nontender, nondistended, + BS MS: no deformity or atrophy Skin: warm and dry, no rash Neuro:  Strength and sensation are intact Psych: normal   EKG:  EKG is ordered today. The ekg ordered today demonstrates NSR at 65.  Normal ECG   Recent Labs: 04/20/2015: ALT 12; BUN  15; Creatinine, Ser 0.91; Hemoglobin 13.3; Platelets 209.0; Potassium 4.9; Sodium 139; TSH 1.96    Lipid Panel    Component Value Date/Time   CHOL 298* 04/20/2015 1529   TRIG 149.0 04/20/2015 1529   HDL 47.50 04/20/2015 1529   CHOLHDL 6 04/20/2015 1529   VLDL 29.8 04/20/2015 1529   LDLCALC 221* 04/20/2015 1529      Wt Readings from Last 3 Encounters:  05/28/15 170 lb 9.6 oz (77.384 kg)  04/20/15 166 lb 6.4 oz (75.479 kg)      Other studies Reviewed: Additional studies/ records that were reviewed today include: . Review of the above records demonstrates:    ASSESSMENT AND PLAN:  1.  Essential Hypertension:  BPis a little bit high. He's on lisinopril 20 g a day. We will stop the lisinopril and start him on losartan 100 mg a day. We will check a basic metabolic profile in 3 weeks. I've advised him to watch his salt intake.  2.   Hyperlipidemia:   Will restart Crestor .  We'll check fasting labs in 3 months.  I will see him again in 3 months.  Current medicines are reviewed at length with the patient today.  The patient does not have concerns regarding medicines.  The following changes have been made:  no change  Labs/ tests ordered today include:   Orders Placed This Encounter  Procedures  . Basic Metabolic Panel (BMET)  . Comp Met (CMET)  . Lipid Profile  . EKG 12-Lead     Disposition:   FU with me in 3 months      Nahser, Wonda Cheng, MD  05/28/2015 4:17 PM    [facility] [place], [place], [place]  [address] Phone: [phone]; Fax: [phone]   [facility]  [address] [place], [place]  [address] [phone]   Fax [phone]

## 2015-05-28 ENCOUNTER — Ambulatory Visit (INDEPENDENT_AMBULATORY_CARE_PROVIDER_SITE_OTHER): Payer: Medicare Other | Admitting: Cardiovascular Disease

## 2015-05-28 ENCOUNTER — Encounter: Payer: Self-pay | Admitting: Cardiovascular Disease

## 2015-05-28 VITALS — BP 164/82 | HR 65 | Ht 64.0 in | Wt 170.6 lb

## 2015-05-28 DIAGNOSIS — E785 Hyperlipidemia, unspecified: Secondary | ICD-10-CM | POA: Insufficient documentation

## 2015-05-28 DIAGNOSIS — I10 Essential (primary) hypertension: Secondary | ICD-10-CM

## 2015-05-28 HISTORY — DX: Hyperlipidemia, unspecified: E78.5

## 2015-05-28 MED ORDER — ROSUVASTATIN CALCIUM 10 MG PO TABS: 10.0000 mg | ORAL_TABLET | Freq: Every day | ORAL | Status: DC

## 2015-05-28 MED ORDER — LOSARTAN POTASSIUM 100 MG PO TABS: 100.0000 mg | ORAL_TABLET | Freq: Every day | ORAL | Status: DC

## 2015-05-28 NOTE — Patient Instructions (Signed)
Medication Instructions:  STOP Lisinopril  START Losartan 100 mg once daily START Crestor 10 mg once daily   Labwork: Your physician recommends that you return for lab work in: 3 weeks for basic metabolic panel  Your physician recommends that you return for lab work in: 3 months on the day of or a few days before your office visit with Dr. Acie Fredrickson.  You will need to FAST for this appointment - nothing to eat or drink after midnight the night before except water.    Testing/Procedures: None Ordered   Follow-Up: Your physician recommends that you schedule a follow-up appointment in: 3 months   If you need a refill on your cardiac medications before your next appointment, please call your pharmacy.   Thank you for choosing [facility]! Christen Bame, RN [phone]

## 2015-06-18 ENCOUNTER — Other Ambulatory Visit (INDEPENDENT_AMBULATORY_CARE_PROVIDER_SITE_OTHER): Payer: Medicare Other | Admitting: *Deleted

## 2015-06-18 DIAGNOSIS — I10 Essential (primary) hypertension: Secondary | ICD-10-CM

## 2015-06-18 DIAGNOSIS — E785 Hyperlipidemia, unspecified: Secondary | ICD-10-CM | POA: Diagnosis not present

## 2015-06-18 LAB — COMPREHENSIVE METABOLIC PANEL
ALT: 16 U/L (ref 9–46)
AST: 21 U/L (ref 10–35)
Albumin: 4.1 g/dL (ref 3.6–5.1)
Alkaline Phosphatase: 65 U/L (ref 40–115)
BUN: 16 mg/dL (ref 7–25)
CO2: 28 mmol/L (ref 20–31)
Calcium: 9.5 mg/dL (ref 8.6–10.3)
Chloride: 103 mmol/L (ref 98–110)
Creat: 0.93 mg/dL (ref 0.70–1.18)
Glucose, Bld: 67 mg/dL (ref 65–99)
Potassium: 4.7 mmol/L (ref 3.5–5.3)
Sodium: 140 mmol/L (ref 135–146)
Total Bilirubin: 0.5 mg/dL (ref 0.2–1.2)
Total Protein: 7.3 g/dL (ref 6.1–8.1)

## 2015-06-18 LAB — BASIC METABOLIC PANEL
BUN: 16 mg/dL (ref 7–25)
CO2: 28 mmol/L (ref 20–31)
Calcium: 9.5 mg/dL (ref 8.6–10.3)
Chloride: 103 mmol/L (ref 98–110)
Creat: 0.93 mg/dL (ref 0.70–1.18)
Glucose, Bld: 67 mg/dL (ref 65–99)
Potassium: 4.7 mmol/L (ref 3.5–5.3)
Sodium: 140 mmol/L (ref 135–146)

## 2015-06-18 LAB — LIPID PANEL
Cholesterol: 188 mg/dL (ref 125–200)
HDL: 57 mg/dL (ref >=40)
LDL Cholesterol: 115 mg/dL (ref <130)
Total CHOL/HDL Ratio: 3.3 Ratio (ref <=5.0)
Triglycerides: 79 mg/dL (ref <150)
VLDL: 16 mg/dL (ref <30)

## 2015-06-21 ENCOUNTER — Other Ambulatory Visit: Payer: Self-pay | Admitting: Nurse Practitioner

## 2015-06-21 DIAGNOSIS — E785 Hyperlipidemia, unspecified: Secondary | ICD-10-CM

## 2015-06-23 ENCOUNTER — Telehealth: Payer: Self-pay

## 2015-06-23 NOTE — Telephone Encounter (Signed)
Left Voice Mail for pt to call back.   RE: Flu Vaccine for 2016  

## 2015-09-02 ENCOUNTER — Other Ambulatory Visit: Payer: Medicare Other

## 2015-09-03 ENCOUNTER — Other Ambulatory Visit: Payer: Medicare Other

## 2015-09-06 ENCOUNTER — Ambulatory Visit: Payer: Medicare Other | Admitting: Cardiovascular Disease

## 2015-09-14 ENCOUNTER — Other Ambulatory Visit (INDEPENDENT_AMBULATORY_CARE_PROVIDER_SITE_OTHER): Payer: Medicare Other | Admitting: *Deleted

## 2015-09-14 DIAGNOSIS — E785 Hyperlipidemia, unspecified: Secondary | ICD-10-CM

## 2015-09-14 LAB — LIPID PANEL
Cholesterol: 295 mg/dL — ABNORMAL HIGH (ref 125–200)
HDL: 52 mg/dL (ref >=40)
LDL Cholesterol: 218 mg/dL — ABNORMAL HIGH (ref <130)
Total CHOL/HDL Ratio: 5.7 Ratio — ABNORMAL HIGH (ref <=5.0)
Triglycerides: 127 mg/dL (ref <150)
VLDL: 25 mg/dL (ref <30)

## 2015-09-14 LAB — COMPREHENSIVE METABOLIC PANEL
ALT: 15 U/L (ref 9–46)
AST: 19 U/L (ref 10–35)
Albumin: 3.9 g/dL (ref 3.6–5.1)
Alkaline Phosphatase: 63 U/L (ref 40–115)
BUN: 14 mg/dL (ref 7–25)
CO2: 29 mmol/L (ref 20–31)
Calcium: 9.2 mg/dL (ref 8.6–10.3)
Chloride: 104 mmol/L (ref 98–110)
Creat: 0.95 mg/dL (ref 0.70–1.18)
Glucose, Bld: 97 mg/dL (ref 65–99)
Potassium: 4.5 mmol/L (ref 3.5–5.3)
Sodium: 139 mmol/L (ref 135–146)
Total Bilirubin: 0.6 mg/dL (ref 0.2–1.2)
Total Protein: 6.3 g/dL (ref 6.1–8.1)

## 2015-09-17 ENCOUNTER — Ambulatory Visit (INDEPENDENT_AMBULATORY_CARE_PROVIDER_SITE_OTHER): Payer: Medicare Other | Admitting: Cardiovascular Disease

## 2015-09-17 ENCOUNTER — Encounter: Payer: Self-pay | Admitting: Cardiovascular Disease

## 2015-09-17 VITALS — BP 145/90 | HR 70 | Ht 64.0 in | Wt 176.6 lb

## 2015-09-17 DIAGNOSIS — E785 Hyperlipidemia, unspecified: Secondary | ICD-10-CM | POA: Diagnosis not present

## 2015-09-17 DIAGNOSIS — I10 Essential (primary) hypertension: Secondary | ICD-10-CM

## 2015-09-17 MED ORDER — HYDROCHLOROTHIAZIDE 25 MG PO TABS: 25.0000 mg | ORAL_TABLET | Freq: Every day | ORAL | Status: DC

## 2015-09-17 MED ORDER — POTASSIUM CHLORIDE ER 10 MEQ PO TBCR: 10.0000 meq | EXTENDED_RELEASE_TABLET | Freq: Every day | ORAL | Status: DC

## 2015-09-17 NOTE — Progress Notes (Signed)
Cardiology Office Note   Date:  09/17/2015   ID:  Danny [name], DOB 06/03/1934, MRN [id number]  PCP:  Mauricio Po, FNP  Cardiologist:   Thayer Headings, MD   Chief Complaint  Patient presents with  . Hypertension   Problem List  1. HTN 2. Hyperlipidemia    History of Present Illness: Danny [name] is a 82 y.o. male who presents to re-establish care. I saw him many years ago . Has a hx of HTN and hyperlipidema.  Mows yards, does lots of yard work .   September 17, 2015:  BP has been running high. Does not eat salt. Hb has been falling - was 12.8   Past Medical History  Diagnosis Date  . HLD (hyperlipidemia)   . Hypertension   . Kidney stones   . History of colon polyps   . Diverticulitis   . Acid reflux     hiatal hernia    Past Surgical History  Procedure Laterality Date  . Cataract surgery       Current Outpatient Prescriptions  Medication Sig Dispense Refill  . aspirin 81 MG tablet Take 81 mg by mouth daily.    . clidinium-chlordiazePOXIDE (LIBRAX) 5-2.5 MG per capsule Take 1 capsule by mouth 3 (three) times daily before meals.     . L-ARGININE PO Take 850 mg by mouth daily.     Marland Kitchen losartan (COZAAR) 100 MG tablet Take 1 tablet (100 mg total) by mouth daily. 31 tablet 11  . Milk Thistle 500 MG CAPS Take 500 mg by mouth daily.     . rosuvastatin (CRESTOR) 10 MG tablet Take 1 tablet (10 mg total) by mouth daily. 31 tablet 11  . tadalafil (CIALIS) 5 MG tablet Take 1 tablet (5 mg total) by mouth daily as needed for erectile dysfunction. 5 tablet 0  . Turmeric 450 MG CAPS Take 720 mg by mouth daily.     Marland Kitchen zoster vaccine live, PF, (ZOSTAVAX) 28413 UNT/0.65ML injection Inject as directed once.     No current facility-administered medications for this visit.    Allergies:   Lipitor    Social History:  The patient  reports that he has quit smoking. His smoking use included Cigars. He has never used smokeless tobacco. He reports that he does not drink  alcohol or use illicit drugs.   Family History:  The patient's family history includes Stroke (age of onset: 80) in his father; Unexplained death (age of onset: 73) in his mother.    ROS:  Please see the history of present illness.    Review of Systems: Constitutional:  denies fever, chills, diaphoresis, appetite change and fatigue.  HEENT: denies photophobia, eye pain, redness, hearing loss, ear pain, congestion, sore throat, rhinorrhea, sneezing, neck pain, neck stiffness and tinnitus.  Respiratory: denies SOB, DOE, cough, chest tightness, and wheezing.  Cardiovascular: denies chest pain, palpitations and leg swelling.  Gastrointestinal: denies nausea, vomiting, abdominal pain, diarrhea, constipation, blood in stool.  Genitourinary: denies dysuria, urgency, frequency, hematuria, flank pain and difficulty urinating.  Musculoskeletal: denies  myalgias, back pain, joint swelling, arthralgias and gait problem.   Skin: denies pallor, rash and wound.  Neurological: denies dizziness, seizures, syncope, weakness, light-headedness, numbness and headaches.   Hematological: denies adenopathy, easy bruising, personal or family bleeding history.  Psychiatric/ Behavioral: denies suicidal ideation, mood changes, confusion, nervousness, sleep disturbance and agitation.       All other systems are reviewed and negative.    PHYSICAL EXAM: VS:  BP 145/90 mmHg  Pulse 70  Ht 5\' 4"  (1.626 m)  Wt 176 lb 9.6 oz (80.105 kg)  BMI 30.30 kg/m2 , BMI Body mass index is 30.3 kg/(m^2). GEN: Well nourished, well developed, in no acute distress HEENT: normal Neck: no JVD, carotid bruits, or masses Cardiac: RRR; no murmurs, rubs, or gallops,no edema  Respiratory:  clear to auscultation bilaterally, normal work of breathing GI: soft, nontender, nondistended, + BS MS: no deformity or atrophy Skin: warm and dry, no rash Neuro:  Strength and sensation are intact Psych: normal   EKG:  EKG is ordered  today. The ekg ordered today demonstrates NSR at 65.  Normal ECG   Recent Labs: 04/20/2015: Hemoglobin 13.3; Platelets 209.0; TSH 1.96 09/14/2015: ALT 15; BUN 14; Creat 0.95; Potassium 4.5; Sodium 139    Lipid Panel    Component Value Date/Time   CHOL 295* 09/14/2015 1039   TRIG 127 09/14/2015 1039   HDL 52 09/14/2015 1039   CHOLHDL 5.7* 09/14/2015 1039   VLDL 25 09/14/2015 1039   LDLCALC 218* 09/14/2015 1039      Wt Readings from Last 3 Encounters:  09/17/15 176 lb 9.6 oz (80.105 kg)  05/28/15 170 lb 9.6 oz (77.384 kg)  04/20/15 166 lb 6.4 oz (75.479 kg)      Other studies Reviewed: Additional studies/ records that were reviewed today include: . Review of the above records demonstrates:    ASSESSMENT AND PLAN:  1.  Essential Hypertension:  BPis a little bit high.  Will add HCTZ 25 a day and Kdur 10 meq a day . Continue losartan for now   2.   Hyperlipidemia:   Will restart Crestor .  We'll check fasting labs in 3 months.  I will see him again in 6 months.  Current medicines are reviewed at length with the patient today.  The patient does not have concerns regarding medicines.  The following changes have been made:  no change  Labs/ tests ordered today include:   No orders of the defined types were placed in this encounter.     Disposition:   FU with me in 6 months      Dreama Kuna, Wonda Cheng, MD  09/17/2015 3:48 PM    [facility] [place], [place], [place]  [address] Phone: [phone]; Fax: [phone]   [facility]  [address] [place], [place]  [address] [phone]   Fax [phone]

## 2015-09-17 NOTE — Patient Instructions (Addendum)
Medication Instructions:  INCREASE Crestor to 10 mg once daily START Kdur (potassium) 10 meq once daily START HCTZ (hydrochlorothiazide) 25 mg once daily   Labwork: Your physician recommends that you return for lab work in: 3 weeks for basic metabolic panel  Your physician recommends that you return for lab work in: 3 months  You will need to FAST for this appointment - nothing to eat or drink after midnight the night before except water.   Testing/Procedures: None Ordered   Follow-Up: Your physician wants you to follow-up in: 6 months with Dr. Acie Fredrickson.  You will receive a reminder letter in the mail two months in advance. If you don't receive a letter, please call our office to schedule the follow-up appointment.    If you need a refill on your cardiac medications before your next appointment, please call your pharmacy.   Thank you for choosing [facility]! Christen Bame, RN [phone]

## 2015-09-24 ENCOUNTER — Telehealth: Payer: Self-pay

## 2015-09-24 NOTE — Telephone Encounter (Signed)
LVM for pt to call back as soon as possible.   RE: Flu Vaccine 2016-2017

## 2015-10-08 ENCOUNTER — Other Ambulatory Visit (INDEPENDENT_AMBULATORY_CARE_PROVIDER_SITE_OTHER): Payer: Medicare Other | Admitting: *Deleted

## 2015-10-08 DIAGNOSIS — I10 Essential (primary) hypertension: Secondary | ICD-10-CM | POA: Diagnosis not present

## 2015-10-08 LAB — BASIC METABOLIC PANEL
BUN: 23 mg/dL (ref 7–25)
CO2: 27 mmol/L (ref 20–31)
Calcium: 8.7 mg/dL (ref 8.6–10.3)
Chloride: 101 mmol/L (ref 98–110)
Creat: 1.25 mg/dL — ABNORMAL HIGH (ref 0.70–1.18)
Glucose, Bld: 114 mg/dL — ABNORMAL HIGH (ref 65–99)
Potassium: 4.1 mmol/L (ref 3.5–5.3)
Sodium: 137 mmol/L (ref 135–146)

## 2015-12-20 ENCOUNTER — Other Ambulatory Visit (INDEPENDENT_AMBULATORY_CARE_PROVIDER_SITE_OTHER): Payer: Medicare Other | Admitting: *Deleted

## 2015-12-20 DIAGNOSIS — E785 Hyperlipidemia, unspecified: Secondary | ICD-10-CM | POA: Diagnosis not present

## 2015-12-20 LAB — LIPID PANEL
Cholesterol: 186 mg/dL (ref 125–200)
HDL: 52 mg/dL (ref >=40)
LDL Cholesterol: 111 mg/dL (ref <130)
Total CHOL/HDL Ratio: 3.6 Ratio (ref <=5.0)
Triglycerides: 113 mg/dL (ref <150)
VLDL: 23 mg/dL (ref <30)

## 2015-12-20 LAB — COMPREHENSIVE METABOLIC PANEL
ALT: 12 U/L (ref 9–46)
AST: 18 U/L (ref 10–35)
Albumin: 4.2 g/dL (ref 3.6–5.1)
Alkaline Phosphatase: 59 U/L (ref 40–115)
BUN: 24 mg/dL (ref 7–25)
CO2: 24 mmol/L (ref 20–31)
Calcium: 9.4 mg/dL (ref 8.6–10.3)
Chloride: 104 mmol/L (ref 98–110)
Creat: 1.22 mg/dL — ABNORMAL HIGH (ref 0.70–1.18)
Glucose, Bld: 93 mg/dL (ref 65–99)
Potassium: 4.3 mmol/L (ref 3.5–5.3)
Sodium: 139 mmol/L (ref 135–146)
Total Bilirubin: 0.6 mg/dL (ref 0.2–1.2)
Total Protein: 6.7 g/dL (ref 6.1–8.1)

## 2016-03-14 ENCOUNTER — Encounter: Payer: Self-pay | Admitting: Cardiovascular Disease

## 2016-03-28 ENCOUNTER — Encounter: Payer: Self-pay | Admitting: Cardiovascular Disease

## 2016-03-28 ENCOUNTER — Ambulatory Visit (INDEPENDENT_AMBULATORY_CARE_PROVIDER_SITE_OTHER): Payer: Medicare Other | Admitting: Cardiovascular Disease

## 2016-03-28 VITALS — BP 130/70 | HR 66 | Ht 64.0 in | Wt 170.8 lb

## 2016-03-28 DIAGNOSIS — I10 Essential (primary) hypertension: Secondary | ICD-10-CM

## 2016-03-28 DIAGNOSIS — E785 Hyperlipidemia, unspecified: Secondary | ICD-10-CM | POA: Diagnosis not present

## 2016-03-28 NOTE — Progress Notes (Signed)
Cardiology Office Note   Date:  03/28/2016   ID:  Danny [name], DOB 07/25/1933, MRN [id number]  PCP:  Mauricio Po, FNP  Cardiologist:   Mertie Moores, MD   Chief Complaint  Patient presents with  . Hypertension   Problem List  1. HTN 2. Hyperlipidemia    History of Present Illness: Danny [name] is a 82 y.o. male who presents to re-establish care. I saw him many years ago . Has a hx of HTN and hyperlipidema.  Mows yards, does lots of yard work .   September 17, 2015:  BP has been running high. Does not eat salt. Hb has been falling - was 12.8  Sept. 19, 2017:  Staying active.  BP and HR are well controlled . Hx of HTN and hyperlipidemia .  Still anemic.     Past Medical History:  Diagnosis Date  . Acid reflux    hiatal hernia  . Diverticulitis   . History of colon polyps   . HLD (hyperlipidemia)   . Hypertension   . Kidney stones     Past Surgical History:  Procedure Laterality Date  . cataract surgery       Current Outpatient Prescriptions  Medication Sig Dispense Refill  . aspirin 81 MG tablet Take 81 mg by mouth daily.    . clidinium-chlordiazePOXIDE (LIBRAX) 5-2.5 MG per capsule Take 1 capsule by mouth 3 (three) times daily before meals.     . hydrochlorothiazide (HYDRODIURIL) 25 MG tablet Take 1 tablet (25 mg total) by mouth daily. 30 tablet 11  . L-ARGININE PO Take 850 mg by mouth daily.     Marland Kitchen losartan (COZAAR) 100 MG tablet Take 1 tablet (100 mg total) by mouth daily. 31 tablet 11  . Milk Thistle 500 MG CAPS Take 500 mg by mouth daily.     . potassium chloride (K-DUR) 10 MEQ tablet Take 1 tablet (10 mEq total) by mouth daily. 30 tablet 11  . rosuvastatin (CRESTOR) 10 MG tablet Take 1 tablet (10 mg total) by mouth daily. 31 tablet 11  . tadalafil (CIALIS) 5 MG tablet Take 1 tablet (5 mg total) by mouth daily as needed for erectile dysfunction. 5 tablet 0  . Turmeric 450 MG CAPS Take 720 mg by mouth daily.     Marland Kitchen zoster vaccine live, PF,  (ZOSTAVAX) 29562 UNT/0.65ML injection Inject as directed once.     No current facility-administered medications for this visit.     Allergies:   Lipitor [atorvastatin]    Social History:  The patient  reports that he has quit smoking. His smoking use included Cigars. He has never used smokeless tobacco. He reports that he does not drink alcohol or use drugs.   Family History:  The patient's family history includes Stroke (age of onset: 79) in his father; Unexplained death (age of onset: 55) in his mother.    ROS:  Please see the history of present illness.    Review of Systems: Constitutional:  denies fever, chills, diaphoresis, appetite change and fatigue.  HEENT: denies photophobia, eye pain, redness, hearing loss, ear pain, congestion, sore throat, rhinorrhea, sneezing, neck pain, neck stiffness and tinnitus.  Respiratory: denies SOB, DOE, cough, chest tightness, and wheezing.  Cardiovascular: denies chest pain, palpitations and leg swelling.  Gastrointestinal: denies nausea, vomiting, abdominal pain, diarrhea, constipation, blood in stool.  Genitourinary: denies dysuria, urgency, frequency, hematuria, flank pain and difficulty urinating.  Musculoskeletal: denies  myalgias, back pain, joint swelling, arthralgias and gait problem.  Skin: denies pallor, rash and wound.  Neurological: denies dizziness, seizures, syncope, weakness, light-headedness, numbness and headaches.   Hematological: denies adenopathy, easy bruising, personal or family bleeding history.  Psychiatric/ Behavioral: denies suicidal ideation, mood changes, confusion, nervousness, sleep disturbance and agitation.       All other systems are reviewed and negative.    PHYSICAL EXAM: VS:  BP 130/70 (BP Location: Left Arm, Patient Position: Sitting, Cuff Size: Normal)   Pulse 66   Ht 5\' 4"  (1.626 m)   Wt 170 lb 12.8 oz (77.5 kg)   BMI 29.32 kg/m  , BMI Body mass index is 29.32 kg/m. GEN: Well nourished, well  developed, in no acute distress  HEENT: normal  Neck: no JVD, carotid bruits, or masses Cardiac: RRR; no murmurs, rubs, or gallops,no edema  Respiratory:  clear to auscultation bilaterally, normal work of breathing GI: soft, nontender, nondistended, + BS MS: no deformity or atrophy  Skin: warm and dry, no rash Neuro:  Strength and sensation are intact Psych: normal   EKG:  EKG is ordered today. The ekg ordered today demonstrates NSR at 66.  PACs.  Otherwise  Normal ECG   Recent Labs: 04/20/2015: Hemoglobin 13.3; Platelets 209.0; TSH 1.96 12/20/2015: ALT 12; BUN 24; Creat 1.22; Potassium 4.3; Sodium 139    Lipid Panel    Component Value Date/Time   CHOL 186 12/20/2015 1008   TRIG 113 12/20/2015 1008   HDL 52 12/20/2015 1008   CHOLHDL 3.6 12/20/2015 1008   VLDL 23 12/20/2015 1008   LDLCALC 111 12/20/2015 1008      Wt Readings from Last 3 Encounters:  03/28/16 170 lb 12.8 oz (77.5 kg)  09/17/15 176 lb 9.6 oz (80.1 kg)  05/28/15 170 lb 9.6 oz (77.4 kg)      Other studies Reviewed: Additional studies/ records that were reviewed today include: . Review of the above records demonstrates:    ASSESSMENT AND PLAN:  1.  Essential Hypertension:  BP is well controlled Continue meds.   2.   Hyperlipidemia:   Much better.      I will see him again in 6 months.  Current medicines are reviewed at length with the patient today.  The patient does not have concerns regarding medicines.  The following changes have been made:  no change  Labs/ tests ordered today include:   No orders of the defined types were placed in this encounter.  Disposition:   FU with me in 6 months      Mertie Moores, MD  03/28/2016 2:16 PM    [facility] [place], [place], [place]  [address] Phone: [phone]; Fax: [phone]   [facility]  [address] [place], [place]  [address] [phone]   Fax [phone]

## 2016-03-28 NOTE — Patient Instructions (Addendum)
Medication Instructions:  Your physician recommends that you continue on your current medications as directed. Please refer to the Current Medication list given to you today.   Labwork: Your physician recommends that you return for lab work in: 1 year on the day of or a few days before your office visit with Dr. Nahser.  You will need to FAST for this appointment - nothing to eat or drink after midnight the night before except water.   Testing/Procedures: None Ordered   Follow-Up: Your physician wants you to follow-up in: 1 year with Dr. Nahser.  You will receive a reminder letter in the mail two months in advance. If you don't receive a letter, please call our office to schedule the follow-up appointment.   If you need a refill on your cardiac medications before your next appointment, please call your pharmacy.   Thank you for choosing [facility]! Kelyn Koskela, RN [phone]    

## 2016-04-04 ENCOUNTER — Ambulatory Visit: Payer: Medicare Other

## 2016-04-04 DIAGNOSIS — Z23 Encounter for immunization: Secondary | ICD-10-CM | POA: Diagnosis not present

## 2016-04-19 DIAGNOSIS — L821 Other seborrheic keratosis: Secondary | ICD-10-CM | POA: Diagnosis not present

## 2016-04-19 DIAGNOSIS — Z85828 Personal history of other malignant neoplasm of skin: Secondary | ICD-10-CM | POA: Diagnosis not present

## 2016-04-19 DIAGNOSIS — L570 Actinic keratosis: Secondary | ICD-10-CM | POA: Diagnosis not present

## 2016-04-19 DIAGNOSIS — D1801 Hemangioma of skin and subcutaneous tissue: Secondary | ICD-10-CM | POA: Diagnosis not present

## 2016-06-05 ENCOUNTER — Other Ambulatory Visit: Payer: Self-pay | Admitting: Cardiovascular Disease

## 2016-08-09 DIAGNOSIS — Z85828 Personal history of other malignant neoplasm of skin: Secondary | ICD-10-CM | POA: Diagnosis not present

## 2016-08-09 DIAGNOSIS — L570 Actinic keratosis: Secondary | ICD-10-CM | POA: Diagnosis not present

## 2016-08-09 DIAGNOSIS — C44329 Squamous cell carcinoma of skin of other parts of face: Secondary | ICD-10-CM | POA: Diagnosis not present

## 2016-08-09 DIAGNOSIS — D044 Carcinoma in situ of skin of scalp and neck: Secondary | ICD-10-CM | POA: Diagnosis not present

## 2016-08-09 DIAGNOSIS — L821 Other seborrheic keratosis: Secondary | ICD-10-CM | POA: Diagnosis not present

## 2016-10-19 ENCOUNTER — Encounter: Payer: Self-pay | Admitting: Family

## 2016-10-19 ENCOUNTER — Ambulatory Visit (INDEPENDENT_AMBULATORY_CARE_PROVIDER_SITE_OTHER): Payer: Medicare Other | Admitting: Family

## 2016-10-19 DIAGNOSIS — R1011 Right upper quadrant pain: Secondary | ICD-10-CM | POA: Insufficient documentation

## 2016-10-19 MED ORDER — CILIDINIUM-CHLORDIAZEPOXIDE 2.5-5 MG PO CAPS: 1.0000 | ORAL_CAPSULE | Freq: Three times a day (TID) | ORAL | 0 refills | Status: DC

## 2016-10-19 NOTE — Progress Notes (Signed)
Subjective:    Patient ID: Danny [name], male    DOB: [birth year], 83 y.o.   MRN: [id number]  Chief Complaint  Patient presents with  . stomach issues    thinks he had a gallbladder attack a few days ago, gets a pain in his side and it is still tender to the touch    HPI:  Danny [name] is a 83 y.o. male who  has a past medical history of Acid reflux; Diverticulitis; History of colon polyps; HLD (hyperlipidemia); Hypertension; and Kidney stones. and presents today for an acute office visit.  This is a new problem. Associated symptom of pain located in his right upper quadrant has has been going on for about 4 days. Previously maintained on Librax which he has not taken in a couple of years. Denies nausea, vomiting, constipation, diarrhea, or blood in stool. Described as sore. Has improved since initial onset. Eating and drinking well.  Allergies  Allergen Reactions  . Lipitor [Atorvastatin]     Muscle soreness      Outpatient Medications Prior to Visit  Medication Sig Dispense Refill  . aspirin 81 MG tablet Take 81 mg by mouth daily.    . hydrochlorothiazide (HYDRODIURIL) 25 MG tablet Take 1 tablet (25 mg total) by mouth daily. 30 tablet 11  . L-ARGININE PO Take 850 mg by mouth daily.     Marland Kitchen losartan (COZAAR) 100 MG tablet TAKE ONE TABLET BY MOUTH ONCE DAILY 90 tablet 3  . tadalafil (CIALIS) 5 MG tablet Take 1 tablet (5 mg total) by mouth daily as needed for erectile dysfunction. 5 tablet 0  . zoster vaccine live, PF, (ZOSTAVAX) 53299 UNT/0.65ML injection Inject as directed once.    . clidinium-chlordiazePOXIDE (LIBRAX) 5-2.5 MG per capsule Take 1 capsule by mouth 3 (three) times daily before meals.     . Milk Thistle 500 MG CAPS Take 500 mg by mouth daily.     . potassium chloride (K-DUR) 10 MEQ tablet Take 1 tablet (10 mEq total) by mouth daily. 30 tablet 11  . rosuvastatin (CRESTOR) 10 MG tablet Take 1 tablet (10 mg total) by mouth daily. 31 tablet 11  . Turmeric 450 MG  CAPS Take 720 mg by mouth daily.      No facility-administered medications prior to visit.       Past Surgical History:  Procedure Laterality Date  . cataract surgery        Past Medical History:  Diagnosis Date  . Acid reflux    hiatal hernia  . Diverticulitis   . History of colon polyps   . HLD (hyperlipidemia)   . Hypertension   . Kidney stones       Review of Systems  Constitutional: Negative for chills and fever.  Respiratory: Negative for chest tightness and shortness of breath.   Cardiovascular: Negative for chest pain.  Gastrointestinal: Positive for abdominal pain. Negative for abdominal distention, blood in stool, constipation, diarrhea, nausea, rectal pain and vomiting.      Objective:    BP (!) 142/80 (BP Location: Left Arm, Patient Position: Sitting, Cuff Size: Normal)   Pulse 73   Temp 98.3 F (36.8 C) (Oral)   Resp 16   Ht 5\' 4"  (1.626 m)   Wt 170 lb 12.8 oz (77.5 kg)   SpO2 98%   BMI 29.32 kg/m  Nursing note and vital signs reviewed.  Physical Exam  Constitutional: He is oriented to person, place, and time. He appears well-developed and well-nourished.  No distress.  Cardiovascular: Normal rate, regular rhythm, normal heart sounds and intact distal pulses.   Pulmonary/Chest: Effort normal and breath sounds normal.  Abdominal: Soft. Normal appearance and bowel sounds are normal. There is no hepatosplenomegaly. There is tenderness in the right upper quadrant and epigastric area. There is no rigidity, no rebound, no guarding, no tenderness at McBurney's point and negative Murphy's sign.  Neurological: He is alert and oriented to person, place, and time.  Skin: Skin is warm and dry.  Psychiatric: He has a normal mood and affect. His behavior is normal. Judgment and thought content normal.       Assessment & Plan:   Problem List Items Addressed This Visit      Other   RUQ pain    New onset right upper quadrant pain with concern for  cholecystitis. Obtain ultrasound. Continue current dosage of Librax pending ultrasound results. Additional treatment pending imaging results.       Relevant Orders   US Abdomen Limited RUQ       I have discontinued Mr. Brune Turmeric, Milk Thistle, rosuvastatin, and potassium chloride. I have also changed his clidinium-chlordiazePOXIDE. Additionally, I am having him maintain his aspirin, L-ARGININE PO, tadalafil, zoster vaccine live (PF), hydrochlorothiazide, losartan, vitamin E, and Probiotic Product (PROBIOTIC ADVANCED PO).   Meds ordered this encounter  Medications  . vitamin E (VITAMIN E) 400 UNIT capsule    Sig: Take 400 Units by mouth daily.  . Probiotic Product (PROBIOTIC ADVANCED PO)    Sig: Take by mouth.  . clidinium-chlordiazePOXIDE (LIBRAX) 5-2.5 MG capsule    Sig: Take 1 capsule by mouth 3 (three) times daily before meals.    Dispense:  60 capsule    Refill:  0    Order Specific Question:   Supervising Provider    Answer:   Pricilla Holm A [[id number]]     Follow-up: Return if symptoms worsen or fail to improve.  Mauricio Po, FNP

## 2016-10-19 NOTE — Assessment & Plan Note (Signed)
New onset right upper quadrant pain with concern for cholecystitis. Obtain ultrasound. Continue current dosage of Librax pending ultrasound results. Additional treatment pending imaging results.

## 2016-10-19 NOTE — Patient Instructions (Addendum)
Thank you for choosing [facility].  SUMMARY AND INSTRUCTIONS:  They will call to schedule your appointment for your ultrasound.  Start the Librax as needed.  Medication:  Your prescription(s) have been submitted to your pharmacy or been printed and provided for you. Please take as directed and contact our office if you believe you are having problem(s) with the medication(s) or have any questions.  Follow up:  If your symptoms worsen or fail to improve, please contact our office for further instruction, or in case of emergency go directly to the emergency room at the closest medical facility.     Cholecystitis Cholecystitis is swelling and irritation (inflammation) of the gallbladder. The gallbladder is an organ that is shaped like a pear. It is under the liver on the right side of the body. This condition is often caused by gallstones. You doctor may do tests to see how your gallbladder works. These tests may include:  Imaging tests, such as:  An ultrasound.  MRI.  Tests that check how your liver works. This condition needs treatment. Follow these instructions at home: Home care will depend on your treatment. In general:  Take over-the-counter and prescription medicines only as told by your doctor.  If you were prescribed an antibiotic medicine, take it as told by your doctor. Do not stop taking the antibiotic even if you start to feel better.  Follow instructions from your doctor about what to eat or drink. When you are allowed to eat, avoid eating or drinking anything that causes your symptoms to start.  Keep all follow-up visits as told by your doctor. This is important. Contact a doctor if:  You have pain and your medicine does not help.  You have a fever. Get help right away if:  Your pain moves to:  Another part of your belly (abdomen).  Your back.  Your symptoms do not go away.  You have new symptoms. This information is not intended to replace  advice given to you by your health care provider. Make sure you discuss any questions you have with your health care provider. Document Released: 06/15/2011 Document Revised: 12/02/2015 Document Reviewed: 10/07/2014 Elsevier Interactive Patient Education  2017 [employer].

## 2016-10-23 ENCOUNTER — Other Ambulatory Visit: Payer: Self-pay | Admitting: Cardiovascular Disease

## 2016-10-26 ENCOUNTER — Ambulatory Visit
Admission: RE | Admit: 2016-10-26 | Discharge: 2016-10-26 | Disposition: A | Discharge status: Home / Self Care | Payer: Medicare Other | Source: Ambulatory Visit | Attending: Family | Admitting: Family

## 2016-10-26 DIAGNOSIS — R1011 Right upper quadrant pain: Secondary | ICD-10-CM | POA: Diagnosis not present

## 2016-12-05 ENCOUNTER — Other Ambulatory Visit: Payer: Self-pay | Admitting: Family

## 2016-12-06 DIAGNOSIS — Z85828 Personal history of other malignant neoplasm of skin: Secondary | ICD-10-CM | POA: Diagnosis not present

## 2016-12-06 DIAGNOSIS — C44622 Squamous cell carcinoma of skin of right upper limb, including shoulder: Secondary | ICD-10-CM | POA: Diagnosis not present

## 2016-12-06 DIAGNOSIS — L820 Inflamed seborrheic keratosis: Secondary | ICD-10-CM | POA: Diagnosis not present

## 2016-12-06 DIAGNOSIS — L97811 Non-pressure chronic ulcer of other part of right lower leg limited to breakdown of skin: Secondary | ICD-10-CM | POA: Diagnosis not present

## 2017-01-26 ENCOUNTER — Other Ambulatory Visit: Payer: Self-pay

## 2017-03-28 DIAGNOSIS — Z23 Encounter for immunization: Secondary | ICD-10-CM | POA: Diagnosis not present

## 2017-04-06 ENCOUNTER — Other Ambulatory Visit: Payer: Self-pay | Admitting: Family

## 2017-05-07 ENCOUNTER — Encounter: Payer: Self-pay | Admitting: *Deleted

## 2017-05-21 ENCOUNTER — Ambulatory Visit (INDEPENDENT_AMBULATORY_CARE_PROVIDER_SITE_OTHER): Payer: Medicare Other | Admitting: Cardiovascular Disease

## 2017-05-21 ENCOUNTER — Encounter: Payer: Self-pay | Admitting: Cardiovascular Disease

## 2017-05-21 VITALS — BP 182/84 | HR 51 | Ht 64.0 in | Wt 171.0 lb

## 2017-05-21 DIAGNOSIS — I10 Essential (primary) hypertension: Secondary | ICD-10-CM

## 2017-05-21 MED ORDER — HYDROCHLOROTHIAZIDE 25 MG PO TABS: 25.0000 mg | ORAL_TABLET | Freq: Every day | ORAL | 3 refills | Status: DC

## 2017-05-21 MED ORDER — POTASSIUM CHLORIDE ER 10 MEQ PO TBCR: 10.0000 meq | EXTENDED_RELEASE_TABLET | Freq: Every day | ORAL | 3 refills | Status: DC

## 2017-05-21 NOTE — Progress Notes (Signed)
Cardiology Office Note   Date:  05/21/2017   ID:  Danny [name], DOB 06-06-1934, MRN [id number]  PCP:  Golden Circle, FNP  Cardiologist:   Mertie Moores, MD   Chief Complaint  Patient presents with  . Hyperlipidemia   Problem List  1. HTN 2. Hyperlipidemia    History of Present Illness: Danny [name] is a 83 y.o. male who presents to re-establish care. I saw him many years ago . Has a hx of HTN and hyperlipidema.  Mows yards, does lots of yard work .   September 17, 2015:  BP has been running high. Does not eat salt. Hb has been falling - was 12.8  Sept. 19, 2017:  Staying active.  BP and HR are well controlled . Hx of HTN and hyperlipidemia .  Still anemic.    March 21, 2017: BP is elevated .  Has not had his HCTZ for the past month or so  Still eating salty foods.    Past Medical History:  Diagnosis Date  . Acid reflux    hiatal hernia  . Diverticulitis   . History of colon polyps   . HLD (hyperlipidemia)   . Hypertension   . Kidney stones     Past Surgical History:  Procedure Laterality Date  . cataract surgery       Current Outpatient Medications  Medication Sig Dispense Refill  . aspirin 81 MG tablet Take 81 mg by mouth daily.    . clidinium-chlordiazePOXIDE (LIBRAX) 5-2.5 MG capsule Take 1 capsule as needed by mouth (FOR STOMACH CRAMPS).    Marland Kitchen L-ARGININE PO Take 850 mg by mouth daily.     Marland Kitchen losartan (COZAAR) 100 MG tablet TAKE ONE TABLET BY MOUTH ONCE DAILY 90 tablet 3   No current facility-administered medications for this visit.     Allergies:   Lipitor [atorvastatin]    Social History:  The patient  reports that he has quit smoking. His smoking use included cigars. he has never used smokeless tobacco. He reports that he does not drink alcohol or use drugs.   Family History:  The patient's family history includes Stroke (age of onset: 47) in his father; Sudden death (age of onset: 75) in his mother.    ROS:  Please see the  history of present illness.      All other systems are reviewed and negative.   Physical Exam: Blood pressure (!) 182/84, pulse (!) 51, height 5\' 4"  (1.626 m), weight 171 lb (77.6 kg), SpO2 97 %.  GEN:  Well nourished, well developed in no acute distress HEENT: Normal NECK: No JVD; No carotid bruits LYMPHATICS: No lymphadenopathy CARDIAC: RR, no murmurs, rubs, gallops RESPIRATORY:  Clear to auscultation without rales, wheezing or rhonchi  ABDOMEN: Soft, non-tender, non-distended MUSCULOSKELETAL:  No edema; No deformity  SKIN: Warm and dry NEUROLOGIC:  Alert and oriented x 3   EKG:  EKG is ordered today. May 21, 2017: Normal sinus rhythm with premature atrial contractions.  Possible anteroseptal myocardial infarction  Recent Labs: No results found for requested labs within last 8760 hours.    Lipid Panel    Component Value Date/Time   CHOL 186 12/20/2015 1008   TRIG 113 12/20/2015 1008   HDL 52 12/20/2015 1008   CHOLHDL 3.6 12/20/2015 1008   VLDL 23 12/20/2015 1008   LDLCALC 111 12/20/2015 1008      Wt Readings from Last 3 Encounters:  05/21/17 171 lb (77.6 kg)  10/19/16 170 lb  12.8 oz (77.5 kg)  03/28/16 170 lb 12.8 oz (77.5 kg)      Other studies Reviewed: Additional studies/ records that were reviewed today include: . Review of the above records demonstrates:    ASSESSMENT AND PLAN:  1.  Essential Hypertension:    BP is elevated .  He has been out of his HCTZ for months.  He also has been eating salty foods.  We will restart his HCTZ 25 mg a day and add potassium chloride 10 mg a day.  We will have him come back to the hypertension clinic in 3 weeks for a visit and basic metabolic profile.  2.   Hyperlipidemia:   Much better.      I will see him again in 6 months.  Current medicines are reviewed at length with the patient today.  The patient does not have concerns regarding medicines.  The following changes have been made:  no change  Labs/ tests  ordered today include:   No orders of the defined types were placed in this encounter.  Disposition:   FU with me in 6 months      Mertie Moores, MD  05/21/2017 3:53 PM    [facility] [address], [place], [place]  [address] Phone: [phone]; Fax: [phone]   [facility]  [address] [place], [place]  [address] [phone]   Fax [phone]

## 2017-05-21 NOTE — Patient Instructions (Signed)
Medication Instructions:  Your physician has recommended you make the following change in your medication:   RESTART HCTZ (hydrochlorothiazide) 25 mg once daily RESTART Kdur (potassium supplement) 10 meq once daily   Labwork: Your physician recommends that you return for lab work in: 3 weeks for basic metabolic panel   Testing/Procedures: None Ordered   Follow-Up: Your physician recommends that you schedule a follow-up appointment in: Hypertension Clinic for medication management   If you need a refill on your cardiac medications before your next appointment, please call your pharmacy.   Thank you for choosing [facility]! Christen Bame, RN [phone]

## 2017-05-29 ENCOUNTER — Other Ambulatory Visit: Payer: Self-pay | Admitting: Cardiovascular Disease

## 2017-06-11 ENCOUNTER — Ambulatory Visit (INDEPENDENT_AMBULATORY_CARE_PROVIDER_SITE_OTHER): Payer: Medicare Other | Admitting: Pharmacist

## 2017-06-11 ENCOUNTER — Other Ambulatory Visit: Payer: Medicare Other | Admitting: *Deleted

## 2017-06-11 VITALS — BP 142/78 | HR 66

## 2017-06-11 DIAGNOSIS — I10 Essential (primary) hypertension: Secondary | ICD-10-CM | POA: Diagnosis not present

## 2017-06-11 NOTE — Progress Notes (Signed)
I agree with the assessment and plan as documented above.   Thank you, Lelan Pons. [facility], [place]  [address], [place], [place] [address]  Phone: [phone]; Fax: [phone] 06/11/2017 4:19 PM

## 2017-06-11 NOTE — Progress Notes (Signed)
Patient ID: ARCHIBALD MARCHETTA                 DOB: Dec 20, 1933                      MRN: [id number]     HPI: Adar Rase Bubel is a 83 y.o. male referred by Dr. Acie Fredrickson to HTN clinic. PMH is significant for HTN, HLD, and diverticulitis. At last OV on 05/21/17, pt BP was elevated at 182/84, HR 51 and it was reported that he had been out of HCTZ for months and had dietary indiscretion. HCTZ 25mg  daily was refilled with K-DUR 29mEq daily, and losartan continued.  Patient arrives to clinic in good spirits today. Denies headaches, dizziness, lightheadedness, or falls. Reports no issues since restarting HCTZ. Their only daughter lives with them, she is going through a divorce and suffering from depression, his wife was also sick recently so he has had some additional stress. Patient would prefer trying to decrease salt and caffeine intake, increase walking, and monitor BP at home rather than adding an additional medication at this time.  Current HTN meds: losartan 100mg  daily, HCTZ 25mg  daily Previously tried: lisinopril 20mg  daily (cough)  BP goal: < 130/80 mmHg  Family History: Stroke (age 42) in father. Sudden death (age 59) and HTN in mother.   Social History: Quit smoking cigars, quit in 2015. Denies smokeless tobacco, alcohol, or other illicit drugs.  Diet:  Eats salmon and chicken, beans and broccoli, greens. Has decreased red meat. If they eat vegetables from a can they always get low sodium. Denies adding salt to food. Drinks: one 16oz cup of caffeinated coffee in the morning, 1-2 12oz glasses of tea per day  Exercise: Mows yards, does a lot of yard work, and [employer] daily.  Home BP readings: Doesn't check his BP at home.   Wt Readings from Last 3 Encounters:  05/21/17 171 lb (77.6 kg)  10/19/16 170 lb 12.8 oz (77.5 kg)  03/28/16 170 lb 12.8 oz (77.5 kg)   BP Readings from Last 3 Encounters:  05/21/17 (!) 182/84  10/19/16 (!) 142/80  03/28/16 130/70   Pulse Readings from Last 3  Encounters:  05/21/17 (!) 51  10/19/16 73  03/28/16 66    Renal function: CrCl cannot be calculated (Patient's most recent lab result is older than the maximum 21 days allowed.).  Past Medical History:  Diagnosis Date  . Acid reflux    hiatal hernia  . Diverticulitis   . History of colon polyps   . HLD (hyperlipidemia)   . Hypertension   . Kidney stones     Current Outpatient Medications on File Prior to Visit  Medication Sig Dispense Refill  . aspirin 81 MG tablet Take 81 mg by mouth daily.    . clidinium-chlordiazePOXIDE (LIBRAX) 5-2.5 MG capsule Take 1 capsule as needed by mouth (FOR STOMACH CRAMPS).    . hydrochlorothiazide (HYDRODIURIL) 25 MG tablet Take 1 tablet (25 mg total) daily by mouth. 90 tablet 3  . L-ARGININE PO Take 850 mg by mouth daily.     Marland Kitchen losartan (COZAAR) 100 MG tablet Take one (1) tablet (100 mg) by mouth daily. 90 tablet 3  . potassium chloride (K-DUR) 10 MEQ tablet Take 1 tablet (10 mEq total) daily by mouth. 90 tablet 3   No current facility-administered medications on file prior to visit.     Allergies  Allergen Reactions  . Lipitor [Atorvastatin]     Muscle  soreness     Assessment/Plan:  1. Hypertension - Patient's BP is 142/78 today in clinic, which is above his goal of < 130/80 mmHg. Patient would prefer to try diet and exercise rather than starting a new medication today. Encouraged decreasing salt and caffeine intake and walking more. Asked patient to check BP daily and bring in a log to next visit. Will consider adding amlodipine 5mg  daily if his BP isn't improved at that time. Recheck BMET today. Follow up in 4 weeks in HTN clinic.  Patient seen with Levonne Lapping, PharmD Candidate

## 2017-06-11 NOTE — Patient Instructions (Addendum)
It was great to see you today!  Continue taking losartan and HCTZ.  Start taking your blood pressure at home once a day and keep a log to bring in at your next visit.  Pick up some decaf coffee and tea, and try using Mrs. DASH to season your food.   Start walking more if possible.  Follow up in 4 weeks.

## 2017-06-12 ENCOUNTER — Ambulatory Visit: Payer: Medicare Other

## 2017-06-12 ENCOUNTER — Other Ambulatory Visit: Payer: Medicare Other

## 2017-06-12 LAB — BASIC METABOLIC PANEL
BUN/Creatinine Ratio: 25 — ABNORMAL HIGH (ref 10–24)
BUN: 26 mg/dL (ref 8–27)
CO2: 28 mmol/L (ref 20–29)
Calcium: 8.9 mg/dL (ref 8.6–10.2)
Chloride: 101 mmol/L (ref 96–106)
Creatinine, Ser: 1.04 mg/dL (ref 0.76–1.27)
GFR calc Af Amer: 77 mL/min/{1.73_m2} (ref >59)
GFR calc non Af Amer: 67 mL/min/{1.73_m2} (ref >59)
Glucose: 83 mg/dL (ref 65–99)
Potassium: 4.3 mmol/L (ref 3.5–5.2)
Sodium: 141 mmol/L (ref 134–144)

## 2017-07-12 ENCOUNTER — Encounter: Payer: Self-pay | Admitting: Pharmacist

## 2017-07-12 ENCOUNTER — Ambulatory Visit (INDEPENDENT_AMBULATORY_CARE_PROVIDER_SITE_OTHER): Payer: Medicare Other | Admitting: Pharmacist

## 2017-07-12 VITALS — BP 138/76 | HR 55

## 2017-07-12 DIAGNOSIS — I10 Essential (primary) hypertension: Secondary | ICD-10-CM

## 2017-07-12 NOTE — Progress Notes (Signed)
Patient ID: Danny [name]                 DOB: [birth year]                      MRN: [id number]     HPI: Danny [name] is a 84 y.o. male patient of Dr. Acie Fredrickson who presents today for hypertension follow up. PMH significant for HTN, HLD, and diverticulitis. Patient previously found to be noncompliant with HCTZ and this was restarted. At his last visit in HTN clinic he deferred additional medication changes and preferred to work on diet and exercise.   He presents today for follow up. He reports that his wife has been monitoring his diet and he has decreased his caffeine intake. His home pressures have improved since his most recent visit in HTN.    Current HTN meds: losartan 100mg  daily, HCTZ 25mg  daily Previously tried: lisinopril 20mg  daily (cough)  BP goal: < 130/80 mmHg  Family History: Stroke (age 76) in father. Sudden death (age 60) and HTN in mother.   Social History: Quit smoking cigars, quit in 2015. Denies smokeless tobacco, alcohol, or other illicit drugs.  Diet:  Eats salmon and chicken, beans and broccoli, greens. Has decreased red meat. If they eat vegetables from a can they always get low sodium. Denies adding salt to food. Drinks: one 16oz cup of caffeinated coffee in the morning, 1-2 12oz glasses of tea per day  Exercise: Mows yards, does a lot of yard work, and [employer] daily.  Home BP readings: 108/55, 133/71, 112/67, 124/66, 123/65, 111/60, 129/68, 122/62, 131/69  Wt Readings from Last 3 Encounters:  05/21/17 171 lb (77.6 kg)  10/19/16 170 lb 12.8 oz (77.5 kg)  03/28/16 170 lb 12.8 oz (77.5 kg)   BP Readings from Last 3 Encounters:  07/12/17 138/76  06/11/17 (!) 142/78  05/21/17 (!) 182/84   Pulse Readings from Last 3 Encounters:  07/12/17 (!) 55  06/11/17 66  05/21/17 (!) 51    Renal function: CrCl cannot be calculated (Patient's most recent lab result is older than the maximum 21 days allowed.).  Past Medical History:  Diagnosis Date  .  Acid reflux    hiatal hernia  . Diverticulitis   . History of colon polyps   . HLD (hyperlipidemia)   . Hypertension   . Kidney stones     Current Outpatient Medications on File Prior to Visit  Medication Sig Dispense Refill  . aspirin 81 MG tablet Take 81 mg by mouth daily.    . clidinium-chlordiazePOXIDE (LIBRAX) 5-2.5 MG capsule Take 1 capsule as needed by mouth (FOR STOMACH CRAMPS).    . hydrochlorothiazide (HYDRODIURIL) 25 MG tablet Take 1 tablet (25 mg total) daily by mouth. 90 tablet 3  . L-ARGININE PO Take 850 mg by mouth daily.     Marland Kitchen losartan (COZAAR) 100 MG tablet Take one (1) tablet (100 mg) by mouth daily. 90 tablet 3  . potassium chloride (K-DUR) 10 MEQ tablet Take 1 tablet (10 mEq total) daily by mouth. 90 tablet 3   No current facility-administered medications on file prior to visit.     Allergies  Allergen Reactions  . Lipitor [Atorvastatin]     Muscle soreness    Blood pressure 138/76, pulse (!) 55.   Assessment/Plan: Hypertension: BP is elevated today but measures appropriately with home cuff which shows pressures at goal. No changes with medications. Continue to monitor and  Follow up with  Dr. Acie Fredrickson as scheduled and HTN clinic as needed.    Thank you, Lelan Pons. Patterson Hammersmith, [facility]  07/12/2017 4:58 PM

## 2017-07-12 NOTE — Patient Instructions (Signed)
Thank you for coming in today! Congratulations on your diet and exercise!  Your blood pressure is controlled outside of the office. If you notice it running higher than 130/80 consistently call our office.   Follow up with Dr. Acie Fredrickson as scheduled.

## 2017-10-14 NOTE — Progress Notes (Signed)
Cardiology Office Note:    Date:  10/15/2017   ID:  Danny [name], DOB [birth year], MRN [id number]  PCP:  Patient, No Pcp Per  Cardiologist:  Mertie Moores, MD  Referring MD: Golden Circle, FNP   Chief Complaint  Patient presents with  . Follow-up    DISCUSS ABOUT POSSIBLE PAD    History of Present Illness:    Danny [name] is a 84 y.o. male with a past medical history significant for hypertension, hyperlipidemia, diverticulitis.  Danny [name] has been followed in our hypertension clinic.  The patient has been working on lifestyle changes including diet and exercise.  He is currently treated with losartan and hydrochlorothiazide.  He was previously on lisinopril but was discontinued due to cough.  Blood pressure goal is <130/80.   NP from his insurance company performed a home visit and noted some lower extremity varicosities and decreased pulses with some abnormal "circulation test" measurements. Danny [name] is here today for evaluation for possible PAD with his wife. The patient is very active working out in the yard with no exertional symptoms. He denies any leg pain with activity, altered sensation or tissue disturbances like sores. He has occ leg cramps especially at night. He does have thick dry toenails and says he has had toenail fungus for years. The NP recommended he see a podiatrist and he has plans to do so. His risk factors for PAD include smoking cigars, 1-2 per day for 40 years, having quit 3 years ago, exposure to second hand smoke from his wife and hyperlipidemia. His LDL has been over 200 in the past. Danny [name] has taken atorvastatin in the past but did not tolerate it due to muscle aches. He was on Crestor in 2016-2017, but stopped taking it because he was afraid of adverse effects. States has been taking garlic, but has been out for a couple of weeks. He denies having any problems on the Crestor 10 mg. On exam of the lower extremities there is no tissue loss, skin is pink and  warm, pedal and posterior tibial pulses are faint, sensation is intact.   Home BP's 120's/60's. Has stopping adding salt to his food. Drinks 1/2 caffeine coffee, 2 cups per day.   Has had costochondritis in the last week after some heavy lifting, is improving with ibuprofen. Was seen by GI MD. Currently not having any chest discomfort, dyspnea, orthopnea, lightheadedness or activity limiting.    Past Medical History:  Diagnosis Date  . Acid reflux    hiatal hernia  . Diverticulitis   . History of colon polyps   . HLD (hyperlipidemia)   . Hypertension   . Kidney stones     Past Surgical History:  Procedure Laterality Date  . cataract surgery      Current Medications: Current Meds  Medication Sig  . aspirin 81 MG tablet Take 81 mg by mouth daily.  . clidinium-chlordiazePOXIDE (LIBRAX) 5-2.5 MG capsule Take 1 capsule as needed by mouth (FOR STOMACH CRAMPS).  . hydrochlorothiazide (HYDRODIURIL) 25 MG tablet Take 1 tablet (25 mg total) daily by mouth.  . L-ARGININE PO Take 850 mg by mouth daily.   Marland Kitchen losartan (COZAAR) 100 MG tablet Take one (1) tablet (100 mg) by mouth daily.  . potassium chloride (K-DUR) 10 MEQ tablet Take 1 tablet (10 mEq total) daily by mouth.     Allergies:   Lipitor [atorvastatin]   Social History   Socioeconomic History  . Marital status: Married  Spouse name: Not on file  . Number of children: 1  . Years of education: 28  . Highest education level: Not on file  Occupational History  . Occupation: Retired  Scientific laboratory technician  . Financial resource strain: Not on file  . Food insecurity:    Worry: Not on file    Inability: Not on file  . Transportation needs:    Medical: Not on file    Non-medical: Not on file  Tobacco Use  . Smoking status: Former Smoker    Types: Cigars  . Smokeless tobacco: Never Used  Substance and Sexual Activity  . Alcohol use: No  . Drug use: No  . Sexual activity: Not on file  Lifestyle  . Physical activity:    Days  per week: Not on file    Minutes per session: Not on file  . Stress: Not on file  Relationships  . Social connections:    Talks on phone: Not on file    Gets together: Not on file    Attends religious service: Not on file    Active member of club or organization: Not on file    Attends meetings of clubs or organizations: Not on file    Relationship status: Not on file  Other Topics Concern  . Not on file  Social History Narrative  . Not on file     Family History: The patient's family history includes Stroke (age of onset: 13) in his father; Sudden death (age of onset: 26) in his mother. ROS:   Please see the history of present illness.     All other systems reviewed and are negative.  EKGs/Labs/Other Studies Reviewed:    The following studies were reviewed today:  Remote normal stress test in 2007    PAD Screen 10/15/2017  Previous PAD dx? No  Previous surgical procedure? No  Pain with walking? No  Feet/toe relief with dangling? Yes  Painful, non-healing ulcers? No  Extremities discolored? No     EKG:  EKG is not ordered today.    Recent Labs: 06/11/2017: BUN 26; Creatinine, Ser 1.04; Potassium 4.3; Sodium 141   Recent Lipid Panel    Component Value Date/Time   CHOL 186 12/20/2015 1008   TRIG 113 12/20/2015 1008   HDL 52 12/20/2015 1008   CHOLHDL 3.6 12/20/2015 1008   VLDL 23 12/20/2015 1008   LDLCALC 111 12/20/2015 1008    Physical Exam:    VS:  BP (!) 148/80   Pulse 72   Ht 5\' 4"  (1.626 m)   Wt 170 lb (77.1 kg)   BMI 29.18 kg/m     Wt Readings from Last 3 Encounters:  10/15/17 170 lb (77.1 kg)  05/21/17 171 lb (77.6 kg)  10/19/16 170 lb 12.8 oz (77.5 kg)     Physical Exam  Constitutional: He is oriented to person, place, and time. He appears well-developed and well-nourished. No distress.  HENT:  Head: Normocephalic and atraumatic.  Neck: Normal range of motion. Neck supple. No JVD present.  Cardiovascular: Normal rate, regular rhythm and  normal heart sounds. Exam reveals no gallop and no friction rub.  No murmur heard. Pulses:      Dorsalis pedis pulses are 1+ on the right side, and 1+ on the left side.       Posterior tibial pulses are 1+ on the right side, and 1+ on the left side.  Pulmonary/Chest: Effort normal. No respiratory distress. He has no wheezes. He has rales.  Abdominal:  Soft. Bowel sounds are normal. He exhibits no distension. There is no tenderness.  Musculoskeletal: Normal range of motion. He exhibits no edema, tenderness or deformity.  Neurological: He is alert and oriented to person, place, and time.  Skin: Skin is warm and dry.  Psychiatric: He has a normal mood and affect. His behavior is normal. Thought content normal.    ASSESSMENT:    1. PVD (peripheral vascular disease) ([place])   2. Essential hypertension   3. Mixed hyperlipidemia    PLAN:    In order of problems listed above:   PVD: noted some abnormal pressures in the lower legs by home NP. No claudication or tissue alteration. Risk factors include prior long history of smoking cigars, HTN and hyperlipidemia, untreated. Will check ABI's. Discussed with pt and his wife that recommended treatment is statin, low dose aspirin, exercise, heart healthy diet, BP and cholesterol treatment. He has been reluctant to take statin, but now agrees to go back on the dose that he tolerated last- Crestor 10 mg. .   Essential hypertension: Goal BP <130/80.  Treated with losartan and hydrochlorothiazide. BP mildly elevated today, has not taken his meds yet. Home BP average 120's/60's. Continue current therapy. Update renal function and electrolytes. Low sodium, heart healthy diet.  Advise low impact- moderate intensity exercise on most days of the week.   Hyperlipidemia: LDL has been over 200 in the past. Hx of muscle aches with atorvastatin. Took crestor in 2016-2017 and tolerated well.  Last LDL in 12/2015 was 111, near goal of <100. Currently not taking any statin  as he was wary of side effects. He was using garlic, but has not taken that in a few weeks. After discussion of effects of high LDL on vascular health he agrees to go back on the Crestor 10 mg daily that he tolerated in the past. Will check lipids today and in 8 weeks to assess therapy response.    Medication Adjustments/Labs and Tests Ordered: Current medicines are reviewed at length with the patient today.  Concerns regarding medicines are outlined above. Labs and tests ordered and medication changes are outlined in the patient instructions below:  Patient Instructions  Medication Instructions:  1. START CRESTOR 10 MG DAILY; RX HAS BEEN SENT IN   Labwork: 1. TODAY BMET, LIPIDS  2. IN 8 WEEKS December 10, 2017 YOU WILL NEED FASTING LIPID AND LIVER PANEL DUE TO NEW START OF CRESTOR ; NOTHING TO EAT AFTER MIDNIGHT THE NIGHT BEFORE LAB WORK; MORNING MEDICATIONS WITH WATER IS FINE   Testing/Procedures: Your physician has requested that you have a lower extremity arterial exercise duplex WITH ABI'S. During this test, exercise and ultrasound are used to evaluate arterial blood flow in the legs. Allow one hour for this exam. There are no restrictions or special instructions.   Follow-Up: Your physician wants you to follow-up in: [place].  You will receive a reminder letter in the mail two months in advance. If you don't receive a letter, please call our office to schedule the follow-up appointment.   Any Other Special Instructions Will Be Listed Below (If Applicable).     If you need a refill on your cardiac medications before your next appointment, please call your pharmacy.           DASH Eating Plan DASH stands for "Dietary Approaches to Stop Hypertension." The DASH eating plan is a healthy eating plan that has been shown to reduce high blood pressure (hypertension). It may  also reduce your risk for type 2 diabetes, heart disease, and stroke. The DASH eating plan may  also help with weight loss. What are tips for following this plan? General guidelines  Avoid eating more than 2,300 mg (milligrams) of salt (sodium) a day. If you have hypertension, you may need to reduce your sodium intake to 1,500 mg a day.  Limit alcohol intake to no more than 1 drink a day for nonpregnant women and 2 drinks a day for men. One drink equals 12 oz of beer, 5 oz of wine, or 1 oz of hard liquor.  Work with your health care provider to maintain a healthy body weight or to lose weight. Ask what an ideal weight is for you.  Get at least 30 minutes of exercise that causes your heart to beat faster (aerobic exercise) most days of the week. Activities may include walking, swimming, or biking.  Work with your health care provider or diet and nutrition specialist (dietitian) to adjust your eating plan to your individual calorie needs. Reading food labels  Check food labels for the amount of sodium per serving. Choose foods with less than 5 percent of the Daily Value of sodium. Generally, foods with less than 300 mg of sodium per serving fit into this eating plan.  To find whole grains, look for the word "whole" as the first word in the ingredient list. Shopping  Buy products labeled as "low-sodium" or "no salt added."  Buy fresh foods. Avoid canned foods and premade or frozen meals. Cooking  Avoid adding salt when cooking. Use salt-free seasonings or herbs instead of table salt or sea salt. Check with your health care provider or pharmacist before using salt substitutes.  Do not fry foods. Cook foods using healthy methods such as baking, boiling, grilling, and broiling instead.  Cook with heart-healthy oils, such as olive, canola, soybean, or sunflower oil. Meal planning   Eat a balanced diet that includes: ? 5 or more servings of fruits and vegetables each day. At each meal, try to fill half of your plate with fruits and vegetables. ? Up to 6-8 servings of whole grains  each day. ? Less than 6 oz of lean meat, poultry, or fish each day. A 3-oz serving of meat is about the same size as a deck of cards. One egg equals 1 oz. ? 2 servings of low-fat dairy each day. ? A serving of nuts, seeds, or beans 5 times each week. ? Heart-healthy fats. Healthy fats called Omega-3 fatty acids are found in foods such as flaxseeds and coldwater fish, like sardines, salmon, and mackerel.  Limit how much you eat of the following: ? Canned or prepackaged foods. ? Food that is high in trans fat, such as fried foods. ? Food that is high in saturated fat, such as fatty meat. ? Sweets, desserts, sugary drinks, and other foods with added sugar. ? Full-fat dairy products.  Do not salt foods before eating.  Try to eat at least 2 vegetarian meals each week.  Eat more home-cooked food and less restaurant, buffet, and fast food.  When eating at a restaurant, ask that your food be prepared with less salt or no salt, if possible. What foods are recommended? The items listed may not be a complete list. Talk with your dietitian about what dietary choices are best for you. Grains Whole-grain or whole-wheat bread. Whole-grain or whole-wheat pasta. Brown rice. [place]. Bulgur. Whole-grain and low-sodium cereals. Pita bread. Low-fat, low-sodium crackers. Whole-wheat flour  tortillas. Vegetables Fresh or frozen vegetables (raw, steamed, roasted, or grilled). Low-sodium or reduced-sodium tomato and vegetable juice. Low-sodium or reduced-sodium tomato sauce and tomato paste. Low-sodium or reduced-sodium canned vegetables. Fruits All fresh, dried, or frozen fruit. Canned fruit in natural juice (without added sugar). Meat and other protein foods Skinless chicken or [place]. Ground chicken or [place]. Pork with fat trimmed off. Fish and seafood. Egg whites. Dried beans, peas, or lentils. Unsalted nuts, nut butters, and seeds. Unsalted canned beans. Lean cuts of beef with fat trimmed off.  Low-sodium, lean deli meat. Dairy Low-fat (1%) or fat-free (skim) milk. Fat-free, low-fat, or reduced-fat cheeses. Nonfat, low-sodium ricotta or cottage cheese. Low-fat or nonfat yogurt. Low-fat, low-sodium cheese. Fats and oils Soft margarine without trans fats. Vegetable oil. Low-fat, reduced-fat, or light mayonnaise and salad dressings (reduced-sodium). Canola, safflower, olive, soybean, and sunflower oils. Avocado. Seasoning and other foods Herbs. Spices. Seasoning mixes without salt. Unsalted popcorn and pretzels. Fat-free sweets. What foods are not recommended? The items listed may not be a complete list. Talk with your dietitian about what dietary choices are best for you. Grains Baked goods made with fat, such as croissants, muffins, or some breads. Dry pasta or rice meal packs. Vegetables Creamed or fried vegetables. Vegetables in a cheese sauce. Regular canned vegetables (not low-sodium or reduced-sodium). Regular canned tomato sauce and paste (not low-sodium or reduced-sodium). Regular tomato and vegetable juice (not low-sodium or reduced-sodium). Angie Fava. Olives. Fruits Canned fruit in a light or heavy syrup. Fried fruit. Fruit in cream or butter sauce. Meat and other protein foods Fatty cuts of meat. Ribs. Fried meat. Berniece Salines. Sausage. Bologna and other processed lunch meats. Salami. Fatback. Hotdogs. Bratwurst. Salted nuts and seeds. Canned beans with added salt. Canned or smoked fish. Whole eggs or egg yolks. Chicken or [place] with skin. Dairy Whole or 2% milk, cream, and half-and-half. Whole or full-fat cream cheese. Whole-fat or sweetened yogurt. Full-fat cheese. Nondairy creamers. Whipped toppings. Processed cheese and cheese spreads. Fats and oils Butter. Stick margarine. Lard. Shortening. [name]. Bacon fat. Tropical oils, such as coconut, palm kernel, or palm oil. Seasoning and other foods Salted popcorn and pretzels. Onion salt, garlic salt, seasoned salt, table salt, and sea  salt. Worcestershire sauce. Tartar sauce. Barbecue sauce. Teriyaki sauce. Soy sauce, including reduced-sodium. Steak sauce. Canned and packaged gravies. Fish sauce. Oyster sauce. Cocktail sauce. Horseradish that you find on the shelf. Ketchup. Mustard. Meat flavorings and tenderizers. Bouillon cubes. Hot sauce and Tabasco sauce. Premade or packaged marinades. Premade or packaged taco seasonings. Relishes. Regular salad dressings. Where to find more information:  National Heart, Lung, and [place]: https://wilson-eaton.com/  American Heart Association: www.heart.org Summary  The DASH eating plan is a healthy eating plan that has been shown to reduce high blood pressure (hypertension). It may also reduce your risk for type 2 diabetes, heart disease, and stroke.  With the DASH eating plan, you should limit salt (sodium) intake to 2,300 mg a day. If you have hypertension, you may need to reduce your sodium intake to 1,500 mg a day.  When on the DASH eating plan, aim to eat more fresh fruits and vegetables, whole grains, lean proteins, low-fat dairy, and heart-healthy fats.  Work with your health care provider or diet and nutrition specialist (dietitian) to adjust your eating plan to your individual calorie needs. This information is not intended to replace advice given to you by your health care provider. Make sure you discuss any questions you have with your health care provider.  Document Released: 06/15/2011 Document Revised: 06/19/2016 Document Reviewed: 06/19/2016 Elsevier Interactive Patient Education  2018 [place].  Peripheral Vascular Disease Peripheral vascular disease (PVD) is a disease of the blood vessels that are not part of your heart and brain. A simple term for PVD is poor circulation. In most cases, PVD narrows the blood vessels that carry blood from your heart to the rest of your body. This can result in a decreased supply of blood to your arms, legs, and internal organs, like  your stomach or kidneys. However, it most often affects a person's lower legs and feet. There are two types of PVD.  Organic PVD. This is the more common type. It is caused by damage to the structure of blood vessels.  Functional PVD. This is caused by conditions that make blood vessels contract and tighten (spasm).  Without treatment, PVD tends to get worse over time. PVD can also lead to acute ischemic limb. This is when an arm or limb suddenly has trouble getting enough blood. This is a medical emergency. What are the causes? Each type of PVD has many different causes. The most common cause of PVD is buildup of a fatty material (plaque) inside of your arteries (atherosclerosis). Small amounts of plaque can break off from the walls of the blood vessels and become lodged in a smaller artery. This blocks blood flow and can cause acute ischemic limb. Other common causes of PVD include:  Blood clots that form inside of blood vessels.  Injuries to blood vessels.  Diseases that cause inflammation of blood vessels or cause blood vessel spasms.  Health behaviors and health history that increase your risk of developing PVD.  What increases the risk? You may have a greater risk of PVD if you:  Have a family history of PVD.  Have certain medical conditions, including: ? High cholesterol. ? Diabetes. ? High blood pressure (hypertension). ? Coronary heart disease. ? Past problems with blood clots. ? Past injury, such as burns or a broken bone. These may have damaged blood vessels in your limbs. ? Buerger disease. This is caused by inflamed blood vessels in your hands and feet. ? Some forms of arthritis. ? Rare birth defects that affect the arteries in your legs.  Use tobacco.  Do not get enough exercise.  Are obese.  Are age 26 or older.  What are the signs or symptoms? PVD may cause many different symptoms. Your symptoms depend on what part of your body is not getting enough  blood. Some common signs and symptoms include:  Cramps in your lower legs. This may be a symptom of poor leg circulation (claudication).  Pain and weakness in your legs while you are physically active that goes away when you rest (intermittent claudication).  Leg pain when at rest.  Leg numbness, tingling, or weakness.  Coldness in a leg or foot, especially when compared with the other leg.  Skin or hair changes. These can include: ? Hair loss. ? Shiny skin. ? Pale or bluish skin. ? Thick toenails.  Inability to get or maintain an erection (erectile dysfunction).  People with PVD are more prone to developing ulcers and sores on their toes, feet, or legs. These may take longer than normal to heal. How is this diagnosed? Your health care provider may diagnose PVD from your signs and symptoms. The health care provider will also do a physical exam. You may have tests to find out what is causing your PVD and determine its severity. Tests  may include:  Blood pressure recordings from your arms and legs and measurements of the strength of your pulses (pulse volume recordings).  Imaging studies using sound waves to take pictures of the blood flow through your blood vessels (Doppler ultrasound).  Injecting a dye into your blood vessels before having imaging studies using: ? X-rays (angiogram or arteriogram). ? Computer-generated X-rays (CT angiogram). ? A powerful electromagnetic field and a computer (magnetic resonance angiogram or MRA).  How is this treated? Treatment for PVD depends on the cause of your condition and the severity of your symptoms. It also depends on your age. Underlying causes need to be treated and controlled. These include long-lasting (chronic) conditions, such as diabetes, high cholesterol, and high blood pressure. You may need to first try making lifestyle changes and taking medicines. Surgery may be needed if these do not work. Lifestyle changes may  include:  Quitting smoking.  Exercising regularly.  Following a low-fat, low-cholesterol diet.  Medicines may include:  Blood thinners to prevent blood clots.  Medicines to improve blood flow.  Medicines to improve your blood cholesterol levels.  Surgical procedures may include:  A procedure that uses an inflated balloon to open a blocked artery and improve blood flow (angioplasty).  A procedure to put in a tube (stent) to keep a blocked artery open (stent implant).  Surgery to reroute blood flow around a blocked artery (peripheral bypass surgery).  Surgery to remove dead tissue from an infected wound on the affected limb.  Amputation. This is surgical removal of the affected limb. This may be necessary in cases of acute ischemic limb that are not improved through medical or surgical treatments.  Follow these instructions at home:  Take medicines only as directed by your health care provider.  Do not use any tobacco products, including cigarettes, chewing tobacco, or electronic cigarettes. If you need help quitting, ask your health care provider.  Lose weight if you are overweight, and maintain a healthy weight as directed by your health care provider.  Eat a diet that is low in fat and cholesterol. If you need help, ask your health care provider.  Exercise regularly. Ask your health care provider to suggest some good activities for you.  Use compression stockings or other mechanical devices as directed by your health care provider.  Take good care of your feet. ? Wear comfortable shoes that fit well. ? Check your feet often for any cuts or sores. Contact a health care provider if:  You have cramps in your legs while walking.  You have leg pain when you are at rest.  You have coldness in a leg or foot.  Your skin changes.  You have erectile dysfunction.  You have cuts or sores on your feet that are not healing. Get help right away if:  Your arm or leg turns  cold and blue.  Your arms or legs become red, warm, swollen, painful, or numb.  You have chest pain or trouble breathing.  You suddenly have weakness in your face, arm, or leg.  You become very confused or lose the ability to speak.  You suddenly have a very bad headache or lose your vision. This information is not intended to replace advice given to you by your health care provider. Make sure you discuss any questions you have with your health care provider. Document Released: 08/03/2004 Document Revised: 12/02/2015 Document Reviewed: 12/04/2013 Elsevier Interactive Patient Education  2017 [place], [place], [place]  10/15/2017  11:49 AM    [facility]

## 2017-10-15 ENCOUNTER — Ambulatory Visit (INDEPENDENT_AMBULATORY_CARE_PROVIDER_SITE_OTHER): Payer: Medicare Other | Admitting: Cardiology

## 2017-10-15 ENCOUNTER — Encounter: Payer: Self-pay | Admitting: Cardiology

## 2017-10-15 VITALS — BP 148/80 | HR 72 | Ht 64.0 in | Wt 170.0 lb

## 2017-10-15 DIAGNOSIS — I739 Peripheral vascular disease, unspecified: Secondary | ICD-10-CM

## 2017-10-15 DIAGNOSIS — I10 Essential (primary) hypertension: Secondary | ICD-10-CM

## 2017-10-15 DIAGNOSIS — E782 Mixed hyperlipidemia: Secondary | ICD-10-CM

## 2017-10-15 LAB — BASIC METABOLIC PANEL
BUN/Creatinine Ratio: 21 (ref 10–24)
BUN: 24 mg/dL (ref 8–27)
CO2: 24 mmol/L (ref 20–29)
Calcium: 9.4 mg/dL (ref 8.6–10.2)
Chloride: 102 mmol/L (ref 96–106)
Creatinine, Ser: 1.12 mg/dL (ref 0.76–1.27)
GFR calc Af Amer: 71 mL/min/{1.73_m2} (ref >59)
GFR calc non Af Amer: 61 mL/min/{1.73_m2} (ref >59)
Glucose: 82 mg/dL (ref 65–99)
Potassium: 4.2 mmol/L (ref 3.5–5.2)
Sodium: 138 mmol/L (ref 134–144)

## 2017-10-15 LAB — LIPID PANEL
Chol/HDL Ratio: 6.3 ratio — ABNORMAL HIGH (ref 0.0–5.0)
Cholesterol, Total: 279 mg/dL — ABNORMAL HIGH (ref 100–199)
HDL: 44 mg/dL (ref >39)
LDL Calculated: 208 mg/dL — ABNORMAL HIGH (ref 0–99)
Triglycerides: 136 mg/dL (ref 0–149)
VLDL Cholesterol Cal: 27 mg/dL (ref 5–40)

## 2017-10-15 MED ORDER — ROSUVASTATIN CALCIUM 10 MG PO TABS: 10.0000 mg | ORAL_TABLET | Freq: Every day | ORAL | 3 refills | Status: DC

## 2017-10-15 NOTE — Patient Instructions (Addendum)
Medication Instructions:  1. START CRESTOR 10 MG DAILY; RX HAS BEEN SENT IN   Labwork: 1. TODAY BMET, LIPIDS  2. IN 8 WEEKS December 10, 2017 YOU WILL NEED FASTING LIPID AND LIVER PANEL DUE TO NEW START OF CRESTOR ; NOTHING TO EAT AFTER MIDNIGHT THE NIGHT BEFORE LAB WORK; MORNING MEDICATIONS WITH WATER IS FINE   Testing/Procedures: Your physician has requested that you have a lower extremity arterial exercise duplex WITH ABI'S. During this test, exercise and ultrasound are used to evaluate arterial blood flow in the legs. Allow one hour for this exam. There are no restrictions or special instructions.   Follow-Up: Your physician wants you to follow-up in: [place] Ac[place] Fredrickson.  You will receive a reminder letter in the mail two months in advance. If you don't receive a letter, please call our office to schedule the follow-up appointment.   Any Other Special Instructions Will Be Listed Below (If Applicable).     If you need a refill on your cardiac medications before your next appointment, please call your pharmacy.           DASH Eating Plan DASH stands for "Dietary Approaches to Stop Hypertension." The DASH eating plan is a healthy eating plan that has been shown to reduce high blood pressure (hypertension). It may also reduce your risk for type 2 diabetes, heart disease, and stroke. The DASH eating plan may also help with weight loss. What are tips for following this plan? General guidelines  Avoid eating more than 2,300 mg (milligrams) of salt (sodium) a day. If you have hypertension, you may need to reduce your sodium intake to 1,500 mg a day.  Limit alcohol intake to no more than 1 drink a day for nonpregnant women and 2 drinks a day for men. One drink equals 12 oz of beer, 5 oz of wine, or 1 oz of hard liquor.  Work with your health care provider to maintain a healthy body weight or to lose weight. Ask what an ideal weight is for you.  Get at least 30 minutes of  exercise that causes your heart to beat faster (aerobic exercise) most days of the week. Activities may include walking, swimming, or biking.  Work with your health care provider or diet and nutrition specialist (dietitian) to adjust your eating plan to your individual calorie needs. Reading food labels  Check food labels for the amount of sodium per serving. Choose foods with less than 5 percent of the Daily Value of sodium. Generally, foods with less than 300 mg of sodium per serving fit into this eating plan.  To find whole grains, look for the word "whole" as the first word in the ingredient list. Shopping  Buy products labeled as "low-sodium" or "no salt added."  Buy fresh foods. Avoid canned foods and premade or frozen meals. Cooking  Avoid adding salt when cooking. Use salt-free seasonings or herbs instead of table salt or sea salt. Check with your health care provider or pharmacist before using salt substitutes.  Do not fry foods. Cook foods using healthy methods such as baking, boiling, grilling, and broiling instead.  Cook with heart-healthy oils, such as olive, canola, soybean, or sunflower oil. Meal planning   Eat a balanced diet that includes: ? 5 or more servings of fruits and vegetables each day. At each meal, try to fill half of your plate with fruits and vegetables. ? Up to 6-8 servings of whole grains each day. ? Less than 6 oz  of lean meat, poultry, or fish each day. A 3-oz serving of meat is about the same size as a deck of cards. One egg equals 1 oz. ? 2 servings of low-fat dairy each day. ? A serving of nuts, seeds, or beans 5 times each week. ? Heart-healthy fats. Healthy fats called Omega-3 fatty acids are found in foods such as flaxseeds and coldwater fish, like sardines, salmon, and mackerel.  Limit how much you eat of the following: ? Canned or prepackaged foods. ? Food that is high in trans fat, such as fried foods. ? Food that is high in saturated fat,  such as fatty meat. ? Sweets, desserts, sugary drinks, and other foods with added sugar. ? Full-fat dairy products.  Do not salt foods before eating.  Try to eat at least 2 vegetarian meals each week.  Eat more home-cooked food and less restaurant, buffet, and fast food.  When eating at a restaurant, ask that your food be prepared with less salt or no salt, if possible. What foods are recommended? The items listed may not be a complete list. Talk with your dietitian about what dietary choices are best for you. Grains Whole-grain or whole-wheat bread. Whole-grain or whole-wheat pasta. Brown rice. [place] Morrow. Bulgur. Whole-grain and low-sodium cereals. Pita bread. Low-fat, low-sodium crackers. Whole-wheat flour tortillas. Vegetables Fresh or frozen vegetables (raw, steamed, roasted, or grilled). Low-sodium or reduced-sodium tomato and vegetable juice. Low-sodium or reduced-sodium tomato sauce and tomato paste. Low-sodium or reduced-sodium canned vegetables. Fruits All fresh, dried, or frozen fruit. Canned fruit in natural juice (without added sugar). Meat and other protein foods Skinless chicken or [place]. Ground chicken or [place]. Pork with fat trimmed off. Fish and seafood. Egg whites. Dried beans, peas, or lentils. Unsalted nuts, nut butters, and seeds. Unsalted canned beans. Lean cuts of beef with fat trimmed off. Low-sodium, lean deli meat. Dairy Low-fat (1%) or fat-free (skim) milk. Fat-free, low-fat, or reduced-fat cheeses. Nonfat, low-sodium ricotta or cottage cheese. Low-fat or nonfat yogurt. Low-fat, low-sodium cheese. Fats and oils Soft margarine without trans fats. Vegetable oil. Low-fat, reduced-fat, or light mayonnaise and salad dressings (reduced-sodium). Canola, safflower, olive, soybean, and sunflower oils. Avocado. Seasoning and other foods Herbs. Spices. Seasoning mixes without salt. Unsalted popcorn and pretzels. Fat-free sweets. What foods are not recommended? The  items listed may not be a complete list. Talk with your dietitian about what dietary choices are best for you. Grains Baked goods made with fat, such as croissants, muffins, or some breads. Dry pasta or rice meal packs. Vegetables Creamed or fried vegetables. Vegetables in a cheese sauce. Regular canned vegetables (not low-sodium or reduced-sodium). Regular canned tomato sauce and paste (not low-sodium or reduced-sodium). Regular tomato and vegetable juice (not low-sodium or reduced-sodium). Angie Fava. Olives. Fruits Canned fruit in a light or heavy syrup. Fried fruit. Fruit in cream or butter sauce. Meat and other protein foods Fatty cuts of meat. Ribs. Fried meat. Berniece Salines. Sausage. Bologna and other processed lunch meats. Salami. Fatback. Hotdogs. Bratwurst. Salted nuts and seeds. Canned beans with added salt. Canned or smoked fish. Whole eggs or egg yolks. Chicken or [place] with skin. Dairy Whole or 2% milk, cream, and half-and-half. Whole or full-fat cream cheese. Whole-fat or sweetened yogurt. Full-fat cheese. Nondairy creamers. Whipped toppings. Processed cheese and cheese spreads. Fats and oils Butter. Stick margarine. Lard. Shortening. Ghee. Bacon fat. Tropical oils, such as coconut, palm kernel, or palm oil. Seasoning and other foods Salted popcorn and pretzels. Onion salt, garlic salt, seasoned salt,  table salt, and sea salt. Worcestershire sauce. Tartar sauce. Barbecue sauce. Teriyaki sauce. Soy sauce, including reduced-sodium. Steak sauce. Canned and packaged gravies. Fish sauce. Oyster sauce. Cocktail sauce. Horseradish that you find on the shelf. Ketchup. Mustard. Meat flavorings and tenderizers. Bouillon cubes. Hot sauce and Tabasco sauce. Premade or packaged marinades. Premade or packaged taco seasonings. Relishes. Regular salad dressings. Where to find more information:  National Heart, Lung, and [place]: https://wilson-eaton.com/  American Heart Association:  www.heart.org Summary  The DASH eating plan is a healthy eating plan that has been shown to reduce high blood pressure (hypertension). It may also reduce your risk for type 2 diabetes, heart disease, and stroke.  With the DASH eating plan, you should limit salt (sodium) intake to 2,300 mg a day. If you have hypertension, you may need to reduce your sodium intake to 1,500 mg a day.  When on the DASH eating plan, aim to eat more fresh fruits and vegetables, whole grains, lean proteins, low-fat dairy, and heart-healthy fats.  Work with your health care provider or diet and nutrition specialist (dietitian) to adjust your eating plan to your individual calorie needs. This information is not intended to replace advice given to you by your health care provider. Make sure you discuss any questions you have with your health care provider. Document Released: 06/15/2011 Document Revised: 06/19/2016 Document Reviewed: 06/19/2016 Elsevier Interactive Patient Education  2018 [place].  Peripheral Vascular Disease Peripheral vascular disease (PVD) is a disease of the blood vessels that are not part of your heart and brain. A simple term for PVD is poor circulation. In most cases, PVD narrows the blood vessels that carry blood from your heart to the rest of your body. This can result in a decreased supply of blood to your arms, legs, and internal organs, like your stomach or kidneys. However, it most often affects a person's lower legs and feet. There are two types of PVD.  Organic PVD. This is the more common type. It is caused by damage to the structure of blood vessels.  Functional PVD. This is caused by conditions that make blood vessels contract and tighten (spasm).  Without treatment, PVD tends to get worse over time. PVD can also lead to acute ischemic limb. This is when an arm or limb suddenly has trouble getting enough blood. This is a medical emergency. What are the causes? Each type of PVD  has many different causes. The most common cause of PVD is buildup of a fatty material (plaque) inside of your arteries (atherosclerosis). Small amounts of plaque can break off from the walls of the blood vessels and become lodged in a smaller artery. This blocks blood flow and can cause acute ischemic limb. Other common causes of PVD include:  Blood clots that form inside of blood vessels.  Injuries to blood vessels.  Diseases that cause inflammation of blood vessels or cause blood vessel spasms.  Health behaviors and health history that increase your risk of developing PVD.  What increases the risk? You may have a greater risk of PVD if you:  Have a family history of PVD.  Have certain medical conditions, including: ? High cholesterol. ? Diabetes. ? High blood pressure (hypertension). ? Coronary heart disease. ? Past problems with blood clots. ? Past injury, such as burns or a broken bone. These may have damaged blood vessels in your limbs. ? Buerger disease. This is caused by inflamed blood vessels in your hands and feet. ? Some forms of arthritis. ?  Rare birth defects that affect the arteries in your legs.  Use tobacco.  Do not get enough exercise.  Are obese.  Are age 12 or older.  What are the signs or symptoms? PVD may cause many different symptoms. Your symptoms depend on what part of your body is not getting enough blood. Some common signs and symptoms include:  Cramps in your lower legs. This may be a symptom of poor leg circulation (claudication).  Pain and weakness in your legs while you are physically active that goes away when you rest (intermittent claudication).  Leg pain when at rest.  Leg numbness, tingling, or weakness.  Coldness in a leg or foot, especially when compared with the other leg.  Skin or hair changes. These can include: ? Hair loss. ? Shiny skin. ? Pale or bluish skin. ? Thick toenails.  Inability to get or maintain an erection  (erectile dysfunction).  People with PVD are more prone to developing ulcers and sores on their toes, feet, or legs. These may take longer than normal to heal. How is this diagnosed? Your health care provider may diagnose PVD from your signs and symptoms. The health care provider will also do a physical exam. You may have tests to find out what is causing your PVD and determine its severity. Tests may include:  Blood pressure recordings from your arms and legs and measurements of the strength of your pulses (pulse volume recordings).  Imaging studies using sound waves to take pictures of the blood flow through your blood vessels (Doppler ultrasound).  Injecting a dye into your blood vessels before having imaging studies using: ? X-rays (angiogram or arteriogram). ? Computer-generated X-rays (CT angiogram). ? A powerful electromagnetic field and a computer (magnetic resonance angiogram or MRA).  How is this treated? Treatment for PVD depends on the cause of your condition and the severity of your symptoms. It also depends on your age. Underlying causes need to be treated and controlled. These include long-lasting (chronic) conditions, such as diabetes, high cholesterol, and high blood pressure. You may need to first try making lifestyle changes and taking medicines. Surgery may be needed if these do not work. Lifestyle changes may include:  Quitting smoking.  Exercising regularly.  Following a low-fat, low-cholesterol diet.  Medicines may include:  Blood thinners to prevent blood clots.  Medicines to improve blood flow.  Medicines to improve your blood cholesterol levels.  Surgical procedures may include:  A procedure that uses an inflated balloon to open a blocked artery and improve blood flow (angioplasty).  A procedure to put in a tube (stent) to keep a blocked artery open (stent implant).  Surgery to reroute blood flow around a blocked artery (peripheral bypass  surgery).  Surgery to remove dead tissue from an infected wound on the affected limb.  Amputation. This is surgical removal of the affected limb. This may be necessary in cases of acute ischemic limb that are not improved through medical or surgical treatments.  Follow these instructions at home:  Take medicines only as directed by your health care provider.  Do not use any tobacco products, including cigarettes, chewing tobacco, or electronic cigarettes. If you need help quitting, ask your health care provider.  Lose weight if you are overweight, and maintain a healthy weight as directed by your health care provider.  Eat a diet that is low in fat and cholesterol. If you need help, ask your health care provider.  Exercise regularly. Ask your health care provider to suggest  some good activities for you.  Use compression stockings or other mechanical devices as directed by your health care provider.  Take good care of your feet. ? Wear comfortable shoes that fit well. ? Check your feet often for any cuts or sores. Contact a health care provider if:  You have cramps in your legs while walking.  You have leg pain when you are at rest.  You have coldness in a leg or foot.  Your skin changes.  You have erectile dysfunction.  You have cuts or sores on your feet that are not healing. Get help right away if:  Your arm or leg turns cold and blue.  Your arms or legs become red, warm, swollen, painful, or numb.  You have chest pain or trouble breathing.  You suddenly have weakness in your face, arm, or leg.  You become very confused or lose the ability to speak.  You suddenly have a very bad headache or lose your vision. This information is not intended to replace advice given to you by your health care provider. Make sure you discuss any questions you have with your health care provider. Document Released: 08/03/2004 Document Revised: 12/02/2015 Document Reviewed:  12/04/2013 Elsevier Interactive Patient Education  2017 [employer].

## 2017-10-16 ENCOUNTER — Other Ambulatory Visit: Payer: Self-pay | Admitting: Cardiology

## 2017-10-16 DIAGNOSIS — I739 Peripheral vascular disease, unspecified: Secondary | ICD-10-CM

## 2017-10-19 ENCOUNTER — Other Ambulatory Visit: Payer: Self-pay | Admitting: Family

## 2017-10-25 ENCOUNTER — Ambulatory Visit (HOSPITAL_COMMUNITY)
Admission: RE | Admit: 2017-10-25 | Discharge: 2017-10-25 | Disposition: A | Discharge status: Home / Self Care | Payer: Medicare Other | Source: Ambulatory Visit | Attending: Cardiovascular Disease | Admitting: Cardiovascular Disease

## 2017-10-25 DIAGNOSIS — E785 Hyperlipidemia, unspecified: Secondary | ICD-10-CM | POA: Diagnosis not present

## 2017-10-25 DIAGNOSIS — I739 Peripheral vascular disease, unspecified: Secondary | ICD-10-CM | POA: Diagnosis present

## 2017-10-25 DIAGNOSIS — I10 Essential (primary) hypertension: Secondary | ICD-10-CM | POA: Diagnosis not present

## 2017-10-25 DIAGNOSIS — Z87891 Personal history of nicotine dependence: Secondary | ICD-10-CM | POA: Diagnosis not present

## 2017-12-10 ENCOUNTER — Other Ambulatory Visit: Payer: Medicare Other | Admitting: *Deleted

## 2017-12-10 ENCOUNTER — Encounter (INDEPENDENT_AMBULATORY_CARE_PROVIDER_SITE_OTHER): Payer: Self-pay

## 2017-12-10 DIAGNOSIS — I10 Essential (primary) hypertension: Secondary | ICD-10-CM

## 2017-12-10 DIAGNOSIS — E782 Mixed hyperlipidemia: Secondary | ICD-10-CM

## 2017-12-10 LAB — HEPATIC FUNCTION PANEL
ALT: 13 IU/L (ref 0–44)
AST: 17 IU/L (ref 0–40)
Albumin: 4.3 g/dL (ref 3.5–4.7)
Alkaline Phosphatase: 73 IU/L (ref 39–117)
Bilirubin Total: 0.3 mg/dL (ref 0.0–1.2)
Bilirubin, Direct: 0.1 mg/dL (ref 0.00–0.40)
Total Protein: 6.7 g/dL (ref 6.0–8.5)

## 2017-12-10 LAB — LIPID PANEL
Chol/HDL Ratio: 3.2 ratio (ref 0.0–5.0)
Cholesterol, Total: 155 mg/dL (ref 100–199)
HDL: 48 mg/dL (ref >39)
LDL Calculated: 92 mg/dL (ref 0–99)
Triglycerides: 77 mg/dL (ref 0–149)
VLDL Cholesterol Cal: 15 mg/dL (ref 5–40)

## 2018-03-19 ENCOUNTER — Encounter: Payer: Self-pay | Admitting: Cardiovascular Disease

## 2018-03-19 ENCOUNTER — Ambulatory Visit: Payer: Medicare Other | Admitting: Cardiovascular Disease

## 2018-03-19 VITALS — BP 136/74 | HR 63 | Ht 64.0 in | Wt 167.4 lb

## 2018-03-19 DIAGNOSIS — I10 Essential (primary) hypertension: Secondary | ICD-10-CM

## 2018-03-19 DIAGNOSIS — E782 Mixed hyperlipidemia: Secondary | ICD-10-CM | POA: Diagnosis not present

## 2018-03-19 NOTE — Patient Instructions (Signed)
Medication Instructions:  Your physician recommends that you continue on your current medications as directed. Please refer to the Current Medication list given to you today.   Labwork: Your physician recommends that you return for lab work in: 6 months on the day of or a few days before your office visit with Dr. Acie Fredrickson.  You will need to FAST for this appointment - nothing to eat or drink after midnight the night before except water.   Testing/Procedures: None Ordered   Follow-Up: Your physician wants you to follow-up in: 6 months with Pecolia Ades, NP or another member of Dr. Elmarie Shiley team. Dennis Bast will receive a reminder letter in the mail two months in advance. If you don't receive a letter, please call our office to schedule the follow-up appointment.   If you need a refill on your cardiac medications before your next appointment, please call your pharmacy.   Thank you for choosing [facility]! Christen Bame, RN [phone]

## 2018-03-19 NOTE — Progress Notes (Signed)
Cardiology Office Note   Date:  03/19/2018   ID:  Danny [name], DOB 07/01/1934, MRN [id number]  PCP:  Patient, No Pcp Per  Cardiologist:   Mertie Moores, MD   Chief Complaint  Patient presents with  . Hypertension   Problem List  1. HTN 2. Hyperlipidemia    Danny [name] is a 84 y.o. male who presents to re-establish care. I saw him many years ago . Has a hx of HTN and hyperlipidema.  Mows yards, does lots of yard work .   September 17, 2015:  BP has been running high. Does not eat salt. Hb has been falling - was 12.8  Sept. 19, 2017:  Staying active.  BP and HR are well controlled . Hx of HTN and hyperlipidemia .  Still anemic.    March 21, 2017: BP is elevated .  Has not had his HCTZ for the past month or so  Still eating salty foods.    March 19, 2018: Patient is doing much better.  He is taking all his medications.  He has been trying to avoid eating so much salt.  Past Medical History:  Diagnosis Date  . Acid reflux    hiatal hernia  . Diverticulitis   . Erectile dysfunction 04/20/2015  . Essential hypertension 04/20/2015  . History of colon polyps   . HLD (hyperlipidemia)   . Hyperlipidemia 05/28/2015  . Hypertension   . Kidney stones   . RUQ pain 10/19/2016    Past Surgical History:  Procedure Laterality Date  . cataract surgery       Current Outpatient Medications  Medication Sig Dispense Refill  . aspirin 81 MG tablet Take 81 mg by mouth daily.    . hydrochlorothiazide (HYDRODIURIL) 25 MG tablet Take 1 tablet (25 mg total) daily by mouth. 90 tablet 3  . L-ARGININE PO Take 900 mg by mouth daily.     Marland Kitchen losartan (COZAAR) 100 MG tablet Take one (1) tablet (100 mg) by mouth daily. 90 tablet 3  . potassium chloride (K-DUR) 10 MEQ tablet Take 1 tablet (10 mEq total) daily by mouth. 90 tablet 3  . rosuvastatin (CRESTOR) 10 MG tablet Take 1 tablet (10 mg total) by mouth daily. 90 tablet 3   No current facility-administered medications  for this visit.     Allergies:   Lipitor [atorvastatin]    Social History:  The patient  reports that he has quit smoking. His smoking use included cigars. He has never used smokeless tobacco. He reports that he does not drink alcohol or use drugs.   Family History:  The patient's family history includes Stroke (age of onset: 41) in his father; Sudden death (age of onset: 52) in his mother.    ROS:  Please see the history of present illness.      All other systems are reviewed and negative.   Physical Exam: Blood pressure 136/74, pulse 63, height 5\' 4"  (1.626 m), weight 167 lb 6.4 oz (75.9 kg), SpO2 96 %.  GEN:  Well nourished, well developed in no acute distress HEENT: Normal NECK: No JVD; No carotid bruits LYMPHATICS: No lymphadenopathy CARDIAC: RR, no murmurs, rubs, gallops RESPIRATORY:  Clear to auscultation without rales, wheezing or rhonchi  ABDOMEN: Soft, non-tender, non-distended MUSCULOSKELETAL:  No edema; No deformity  SKIN: Warm and dry NEUROLOGIC:  Alert and oriented x 3   EKG:  Sept. 10, 2019  :  NSR at 63.   Occasional PACs   Recent Labs:  10/15/2017: BUN 24; Creatinine, Ser 1.12; Potassium 4.2; Sodium 138 12/10/2017: ALT 13    Lipid Panel    Component Value Date/Time   CHOL 155 12/10/2017 0847   TRIG 77 12/10/2017 0847   HDL 48 12/10/2017 0847   CHOLHDL 3.2 12/10/2017 0847   CHOLHDL 3.6 12/20/2015 1008   VLDL 23 12/20/2015 1008   LDLCALC 92 12/10/2017 0847      Wt Readings from Last 3 Encounters:  03/19/18 167 lb 6.4 oz (75.9 kg)  10/15/17 170 lb (77.1 kg)  05/21/17 171 lb (77.6 kg)      Other studies Reviewed: Additional studies/ records that were reviewed today include: . Review of the above records demonstrates:    ASSESSMENT AND PLAN:  1.  Essential Hypertension:     BP is well controlled .  2.   Hyperlipidemia:    Last lipid levels look good    Current medicines are reviewed at length with the patient today.  The patient does not  have concerns regarding medicines.  The following changes have been made:  no change  Labs/ tests ordered today include:   No orders of the defined types were placed in this encounter.  Disposition:   FU with Danny [name] in 6 months .  I'll see him in 1 year      Mertie Moores, MD  03/19/2018 11:28 AM    [address], [place], [place]  [address] Phone: [phone]; Fax: [phone]   [facility]  [address] [place], [place]  [address] [phone]   Fax [phone]

## 2018-05-29 ENCOUNTER — Other Ambulatory Visit: Payer: Self-pay | Admitting: Cardiovascular Disease

## 2018-06-12 ENCOUNTER — Ambulatory Visit: Payer: Medicare Other | Admitting: Cardiovascular Disease

## 2018-07-18 ENCOUNTER — Encounter: Payer: Self-pay | Admitting: Family Medicine

## 2018-07-23 ENCOUNTER — Encounter: Payer: Self-pay | Admitting: Family Medicine

## 2018-07-23 ENCOUNTER — Ambulatory Visit: Payer: Medicare Other | Admitting: Family Medicine

## 2018-07-23 VITALS — BP 150/70 | HR 62 | Resp 17 | Ht 64.0 in | Wt 160.8 lb

## 2018-07-23 DIAGNOSIS — E782 Mixed hyperlipidemia: Secondary | ICD-10-CM

## 2018-07-23 DIAGNOSIS — I10 Essential (primary) hypertension: Secondary | ICD-10-CM

## 2018-07-23 DIAGNOSIS — D649 Anemia, unspecified: Secondary | ICD-10-CM

## 2018-07-23 DIAGNOSIS — R55 Syncope and collapse: Secondary | ICD-10-CM | POA: Diagnosis not present

## 2018-07-23 DIAGNOSIS — Z131 Encounter for screening for diabetes mellitus: Secondary | ICD-10-CM

## 2018-07-23 DIAGNOSIS — Z7689 Persons encountering health services in other specified circumstances: Secondary | ICD-10-CM

## 2018-07-23 NOTE — Progress Notes (Signed)
Danny [name], is a 85 y.o. male  DJT:[id number]  ZES:[id number]  DOB - [birth year]  CC:  Chief Complaint  Patient presents with  . Establish Care  . Hypertension  . Hyperlipidemia  . Medication Management    wants to discuss restarting Librix for IBS       HPI: Danny [name] is a 85 y.o. male is here today to establish care.   Danny [name] has Medicare annual wellness visit, subsequent; Erectile dysfunction; Essential hypertension; Hyperlipidemia; and RUQ pain on their problem list.   Patient is accompanied today by his wife.  Syncopal episode  Patient and wife report that patient sustained a syncopal episode over 1 month ago while at home and they did not seek medical attention. Patient reports that morning upon awakening and ambulating he felt a little off balance. He subsequently went to the restroom to have a bowel movement and passed out with loss of consciousness for approximately 2 minutes.  His wife was able to assist him in regaining consciousness and ambulating.  She reports that during the process of awakening patient he was mildly confused.  He remained on bed rest for approximately 3 to 4 days after this episode without seeking any medical care.  Patient has longstanding history hypertension he is a former smoker.  Has no known history of diabetes.  He has no prior episodes of syncope.  He has never had a stroke or MI in the past.  Considers himself overall healthy and very active.  He denies any acute onset of weakness or dizziness since this episode occurred.   Hypertension and  Hyperlipidemia Patient suffers from hyperlipidemia and hypertension.  He is closely followed by cardiologist, Dr. Acie Fredrickson for several years.  In review of cardiology notes patient's blood pressure has remained at goal of <130/80.  He is physically active. Compliant with medication and reports adherence to a low-sodium diet. Last lipid panel collected in June 2019 was completely normal with statin therapy.   He reports he is compliant with medications.  Denies any chest pain, lower extremity swelling or medication intolerance.  Overdue health maintenance   TDAP and Pneumonia, wife reported receipt at a local pharmacy.   Current medications: Current Outpatient Medications:  .  aspirin 81 MG tablet, Take 81 mg by mouth daily., Disp: , Rfl:  .  hydrochlorothiazide (HYDRODIURIL) 25 MG tablet, TAKE 1 TABLET BY MOUTH ONCE DAILY, Disp: 90 tablet, Rfl: 2 .  L-ARGININE PO, Take 900 mg by mouth daily. , Disp: , Rfl:  .  losartan (COZAAR) 100 MG tablet, TAKE 1 TABLET BY MOUTH ONCE DAILY, Disp: 90 tablet, Rfl: 2 .  potassium chloride (K-DUR) 10 MEQ tablet, TAKE 1 TABLET BY MOUTH ONCE DAILY, Disp: 90 tablet, Rfl: 2 .  rosuvastatin (CRESTOR) 10 MG tablet, Take 1 tablet (10 mg total) by mouth daily., Disp: 90 tablet, Rfl: 3   Pertinent family medical history: family history includes Stroke (age of onset: 57) in his father; Sudden death (age of onset: 74) in his mother.   Allergies  Allergen Reactions  . Lipitor [Atorvastatin]     Muscle soreness    Social History   Socioeconomic History  . Marital status: Married    Spouse name: Not on file  . Number of children: 1  . Years of education: 62  . Highest education level: Not on file  Occupational History  . Occupation: Retired  Scientific laboratory technician  . Financial resource strain: Not on file  . Food insecurity:  Worry: Not on file    Inability: Not on file  . Transportation needs:    Medical: Not on file    Non-medical: Not on file  Tobacco Use  . Smoking status: Former Smoker    Types: Cigars  . Smokeless tobacco: Never Used  Substance and Sexual Activity  . Alcohol use: No  . Drug use: No  . Sexual activity: Not on file  Lifestyle  . Physical activity:    Days per week: Not on file    Minutes per session: Not on file  . Stress: Not on file  Relationships  . Social connections:    Talks on phone: Not on file    Gets together: Not on file     Attends religious service: Not on file    Active member of club or organization: Not on file    Attends meetings of clubs or organizations: Not on file    Relationship status: Not on file  . Intimate partner violence:    Fear of current or ex partner: Not on file    Emotionally abused: Not on file    Physically abused: Not on file    Forced sexual activity: Not on file  Other Topics Concern  . Not on file  Social History Narrative  . Not on file    Review of Systems: Pertinent negatives listed in HPI Objective:   Vitals:   07/23/18 0902  BP: (!) 150/70  Pulse: 62  Resp: 17  SpO2: 98%    BP Readings from Last 3 Encounters:  07/23/18 (!) 150/70  03/19/18 136/74  10/15/17 (!) 148/80    Filed Weights   07/23/18 0902  Weight: 160 lb 12.8 oz (72.9 kg)      Physical Exam: Constitutional: Patient appears well-developed and well-nourished. No distress. HENT: Normocephalic, atraumatic, External right and left ear normal. Oropharynx is clear and moist.  Eyes: Conjunctivae and EOM are normal. PERRLA, no scleral icterus. Neck: Normal ROM. Neck supple. No JVD. No tracheal deviation. No thyromegaly. CVS: RRR, S1/S2 +, no murmurs, no gallops, no carotid bruit.  Pulmonary: Effort and breath sounds normal, no stridor, rhonchi, wheezes, rales.  Abdominal: Soft. BS +, no distension, tenderness, rebound or guarding.  Musculoskeletal: Normal range of motion. No edema and no tenderness.  Neuro: Alert. Normal muscle tone coordination. Normal gait. BUE and BLE strength 5/5. Bilateral hand grips symmetrical. No cranial nerve deficit. Skin: Skin is warm and dry. No rash noted. Not diaphoretic. No erythema. No pallor. Psychiatric: Normal mood and affect. Behavior, judgment, thought content normal.  Lab Results (prior encounters)  Lab Results  Component Value Date   WBC 6.7 04/20/2015   HGB 13.3 04/20/2015   HCT 39.3 04/20/2015   MCV 97.8 04/20/2015   PLT 209.0 04/20/2015   Lab  Results  Component Value Date   CREATININE 1.12 10/15/2017   BUN 24 10/15/2017   NA 138 10/15/2017   K 4.2 10/15/2017   CL 102 10/15/2017   CO2 24 10/15/2017    No results found for: HGBA1C     Component Value Date/Time   CHOL 155 12/10/2017 0847   TRIG 77 12/10/2017 0847   HDL 48 12/10/2017 0847   CHOLHDL 3.2 12/10/2017 0847   CHOLHDL 3.6 12/20/2015 1008   VLDL 23 12/20/2015 1008   LDLCALC 92 12/10/2017 0847        Assessment and plan:  1. Encounter to establish care 2. Essential hypertension, elevated today, however he is fasting and has not  taken medication today.  We have discussed target BP range and blood pressure goal. I have advised patient to check BP regularly and to call us back or report to clinic if the numbers are consistently higher than 140/90. We discussed the importance of compliance with medical therapy and DASH diet recommended, consequences of uncontrolled hypertension discussed.  No changes with current medications. Continue current BP medications Checking-TSH  3. Mixed hyperlipidemia Repeating fasting  Lipid panel  4. Screening for diabetes mellitus - Hemoglobin A1c  5. Low hemoglobin - CBC with Differential - Iron, TIBC and Ferritin Panel  6. Syncope, unspecified syncope type Uncertain of etiology of syncopal episode. Suspect possible TIA and or possible vasovagal episode. Given age and risks factors for cerebrovascular disease I will refer her emergently to neurology for further work-up and evaluation. - Ambulatory referral to Neurology, urgent    Orders Placed This Encounter  Procedures  . Lipid panel    Order Specific Question:   Has the patient fasted?    Answer:   No  . Comprehensive metabolic panel    Order Specific Question:   Has the patient fasted?    Answer:   No  . TSH  . Hemoglobin A1c  . CBC with Differential  . Iron, TIBC and Ferritin Panel  . Ambulatory referral to Neurology    Referral Priority:   Urgent    Referral  Type:   Consultation    Referral Reason:   Specialty Services Required    Requested Specialty:   Neurology    Number of Visits Requested:   1    A total of 35 minutes spent, greater than 50 % of this time was spent counseling, reviewing extensive medical history and notes from EMR and coordination of care.   Return in about 3 months (around 10/22/2018) for 6 months chronic conditions-sooner if needed .     The patient was given clear instructions to go to ER or return to medical center if symptoms don't improve, worsen or new problems develop. The patient verbalized understanding. The patient was advised  to call and obtain lab results if they haven't heard anything from out office within 7-10 business days.  Molli Barrows, FNP Primary Care at [facility] [address] [place] [address] [phone]: [phone]    This note has been created with Dragon speech recognition software and Engineer, materials. Any transcriptional errors are unintentional.

## 2018-07-23 NOTE — Patient Instructions (Addendum)
Thank you for choosing Primary Care at [facility] to be your medical home!    Danny [name] was seen by Molli Barrows, FNP today.   Joya Martyr Klingbeil's primary care provider is Scot Jun, FNP.   For the best care possible, you should try to see Molli Barrows, FNP-C whenever you come to the clinic.   We look forward to seeing you again soon!  If you have any questions about your visit today, please call us at [phone] or feel free to reach your primary care provider via [facility].     You are being referred to neurology for further work-up and evaluation of previous episode of fainting at home.  If this occurs again please call [phone] immediately and go immediately to the emergency department for further evaluation.  No changes to medications.  You will be notified of your lab results once they are received.    Syncope Syncope is when you pass out (faint) for a short time. It is caused by a sudden decrease in blood flow to the brain. Signs that you may be about to pass out include:  Feeling dizzy or light-headed.  Feeling sick to your stomach (nauseous).  Seeing all white or all black.  Having cold, clammy skin. If you pass out, get help right away. Call your local emergency services (911 in the [place] Do not drive yourself to the hospital. Follow these instructions at home: Watch for any changes in your symptoms. Take these actions to stay safe and help with your symptoms: Lifestyle  Do not drive, use machinery, or play sports until your doctor says it is okay.  Do not drink alcohol.  Do not use any products that contain nicotine or tobacco, such as cigarettes and e-cigarettes. If you need help quitting, ask your doctor.  Drink enough fluid to keep your pee (urine) pale yellow. General instructions  Take over-the-counter and prescription medicines only as told by your doctor.  If you are taking blood pressure or heart medicine, sit up and stand up slowly.  Spend a few minutes getting ready to sit and then stand. This can help you feel less dizzy.  Have someone stay with you until you feel stable.  If you start to feel like you might pass out, lie down right away and raise (elevate) your feet above the level of your heart. Breathe deeply and steadily. Wait until all of the symptoms are gone.  Keep all follow-up visits as told by your doctor. This is important. Get help right away if:  You have a very bad headache.  You pass out once or more than once.  You have pain in your chest, belly, or back.  You have a very fast or uneven heartbeat (palpitations).  It hurts to breathe.  You are bleeding from your mouth or your bottom (rectum).  You have black or tarry poop (stool).  You have jerky movements that you cannot control (seizure).  You are confused.  You have trouble walking.  You are very weak.  You have vision problems. These symptoms may be an emergency. Do not wait to see if the symptoms will go away. Get medical help right away. Call your local emergency services (911 in the [place] Do not drive yourself to the hospital. Summary  Syncope is when you pass out (faint) for a short time. It is caused by a sudden decrease in blood flow to the brain.  Signs that you may be about to faint include  feeling dizzy, light-headed, or sick to your stomach, seeing all white or all black, or having cold, clammy skin.  If you start to feel like you might pass out, lie down right away and raise (elevate) your feet above the level of your heart. Breathe deeply and steadily. Wait until all of the symptoms are gone. This information is not intended to replace advice given to you by your health care provider. Make sure you discuss any questions you have with your health care provider. Document Released: 12/13/2007 Document Revised: 08/08/2017 Document Reviewed: 08/08/2017 Elsevier Interactive Patient Education  2019 [employer].

## 2018-07-24 LAB — CBC WITH DIFFERENTIAL/PLATELET
Basophils Absolute: 0 10*3/uL (ref 0.0–0.2)
Basos: 1 %
EOS (ABSOLUTE): 0.1 10*3/uL (ref 0.0–0.4)
Eos: 3 %
Hematocrit: 32.1 % — ABNORMAL LOW (ref 37.5–51.0)
Hemoglobin: 11.4 g/dL — ABNORMAL LOW (ref 13.0–17.7)
Immature Grans (Abs): 0 10*3/uL (ref 0.0–0.1)
Immature Granulocytes: 0 %
Lymphocytes Absolute: 1.6 10*3/uL (ref 0.7–3.1)
Lymphs: 28 %
MCH: 34 pg — ABNORMAL HIGH (ref 26.6–33.0)
MCHC: 35.5 g/dL (ref 31.5–35.7)
MCV: 96 fL (ref 79–97)
Monocytes Absolute: 0.6 10*3/uL (ref 0.1–0.9)
Monocytes: 11 %
Neutrophils Absolute: 3.2 10*3/uL (ref 1.4–7.0)
Neutrophils: 57 %
Platelets: 184 10*3/uL (ref 150–450)
RBC: 3.35 x10E6/uL — ABNORMAL LOW (ref 4.14–5.80)
RDW: 12.7 % (ref 11.6–15.4)
WBC: 5.6 10*3/uL (ref 3.4–10.8)

## 2018-07-24 LAB — LIPID PANEL
Chol/HDL Ratio: 2.6 ratio (ref 0.0–5.0)
Cholesterol, Total: 156 mg/dL (ref 100–199)
HDL: 60 mg/dL (ref >39)
LDL Calculated: 80 mg/dL (ref 0–99)
Triglycerides: 82 mg/dL (ref 0–149)
VLDL Cholesterol Cal: 16 mg/dL (ref 5–40)

## 2018-07-24 LAB — COMPREHENSIVE METABOLIC PANEL
ALT: 13 IU/L (ref 0–44)
AST: 18 IU/L (ref 0–40)
Albumin/Globulin Ratio: 1.7 (ref 1.2–2.2)
Albumin: 4.2 g/dL (ref 3.5–4.7)
Alkaline Phosphatase: 66 IU/L (ref 39–117)
BUN/Creatinine Ratio: 17 (ref 10–24)
BUN: 21 mg/dL (ref 8–27)
Bilirubin Total: 0.4 mg/dL (ref 0.0–1.2)
CO2: 23 mmol/L (ref 20–29)
Calcium: 9.3 mg/dL (ref 8.6–10.2)
Chloride: 103 mmol/L (ref 96–106)
Creatinine, Ser: 1.22 mg/dL (ref 0.76–1.27)
GFR calc Af Amer: 63 mL/min/{1.73_m2} (ref >59)
GFR calc non Af Amer: 55 mL/min/{1.73_m2} — ABNORMAL LOW (ref >59)
Globulin, Total: 2.5 g/dL (ref 1.5–4.5)
Glucose: 82 mg/dL (ref 65–99)
Potassium: 4.6 mmol/L (ref 3.5–5.2)
Sodium: 140 mmol/L (ref 134–144)
Total Protein: 6.7 g/dL (ref 6.0–8.5)

## 2018-07-24 LAB — HEMOGLOBIN A1C
Est. average glucose Bld gHb Est-mCnc: 111 mg/dL
Hgb A1c MFr Bld: 5.5 % (ref 4.8–5.6)

## 2018-07-24 LAB — IRON,TIBC AND FERRITIN PANEL
Ferritin: 219 ng/mL (ref 30–400)
Iron Saturation: 33 % (ref 15–55)
Iron: 100 ug/dL (ref 38–169)
Total Iron Binding Capacity: 300 ug/dL (ref 250–450)
UIBC: 200 ug/dL (ref 111–343)

## 2018-07-24 LAB — TSH: TSH: 3.19 u[IU]/mL (ref 0.450–4.500)

## 2018-07-25 ENCOUNTER — Ambulatory Visit: Payer: Medicare Other | Admitting: Neurology

## 2018-07-25 ENCOUNTER — Encounter: Payer: Self-pay | Admitting: Neurology

## 2018-07-25 VITALS — Ht 64.0 in | Wt 161.5 lb

## 2018-07-25 DIAGNOSIS — R55 Syncope and collapse: Secondary | ICD-10-CM

## 2018-07-25 DIAGNOSIS — R404 Transient alteration of awareness: Secondary | ICD-10-CM

## 2018-07-25 DIAGNOSIS — I10 Essential (primary) hypertension: Secondary | ICD-10-CM | POA: Diagnosis not present

## 2018-07-25 NOTE — Progress Notes (Signed)
[facility]  PATIENT: Danny [name] DOB: Feb 28, 1934  REFERRING DOCTOR OR PCP: Molli Barrows, FNP SOURCE: Patient, wife, notes from primary care  _________________________________   HISTORICAL  CHIEF COMPLAINT:  Chief Complaint  Patient presents with  . New Patient (Initial Visit)    RM 39 with wife. Internal referral for syncope. Had one episode where he was going to the bathroom and he passed out. Does not remember anything. Wife found him laying on the floor. Denies straining to use the bathroom. Stool was loose per wife. Denies any dizziness with changes in positition. Has not had any more episodes.     HISTORY OF PRESENT ILLNESS:  I had the pleasure of seeing your patient, Danny [name], at [facility] for neurologic consultation.  He is an 85 year old man with an episode of unresponsiveness.  On Decemebr 21, 2019 he was watching TV and needed to use the bathroom.   He felt poor while walking to the bedroom and laid down. He was not lightheaded.    However, he needed to use the bathroom to defecate so he got up to go to the commode.   He sat down and had diarrhea.   Next thing he knows he was on the floor.Marland Kitchen   He does not know if he fell and then became unresponsive or if he became unresponsive and then fell.   He was found by his wife who heard the fall (but no preceding sound) on the floor and he had a contusion near the vertex of the head.    He was completely unresponsive about 5 minutes and then was groggy 15-20 minutes more.   She was able to get him cleaned off in the shower and he went to bed.     He felt better by that time and rested but did not sleep.      About 12 years ago, he had an episode of syncope where he was working.   Of note he was feeling sick and had taken Sudafed,  He lost consciousness when he stood and quickly got back to baseline,    He went to ED after he was told he was dehydrated and received IV fluids.    He has  no episodes of presyncope or syncope since until the current episode.    He feels completely baseline currently (after an additional day of feeling more tired).       He is fairly healthy with essential hypertension and elevated cholesterol.    He has never had an MI or other serious medical issue.     REVIEW OF SYSTEMS: Constitutional: No fevers, chills, sweats, or change in appetite Eyes: No visual changes, double vision, eye pain Ear, nose and throat: No hearing loss, ear pain, nasal congestion, sore throat Cardiovascular: No chest pain, palpitations Respiratory: No shortness of breath at rest or with exertion.   No wheezes GastrointestinaI: No nausea, vomiting, diarrhea, abdominal pain, fecal incontinence Genitourinary: No dysuria, urinary retention or frequency.  No nocturia. Musculoskeletal: No neck pain, back pain Integumentary: No rash, pruritus, skin lesions Neurological: as above Psychiatric: No depression at this time.  No anxiety Endocrine: No palpitations, diaphoresis, change in appetite, change in weigh or increased thirst Hematologic/Lymphatic: No anemia, purpura, petechiae. Allergic/Immunologic: No itchy/runny eyes, nasal congestion, recent allergic reactions, rashes  ALLERGIES: Allergies  Allergen Reactions  . Lipitor [Atorvastatin]     Muscle soreness    HOME MEDICATIONS:  Current Outpatient Medications:  .  aspirin 81  MG tablet, Take 81 mg by mouth daily., Disp: , Rfl:  .  hydrochlorothiazide (HYDRODIURIL) 25 MG tablet, TAKE 1 TABLET BY MOUTH ONCE DAILY, Disp: 90 tablet, Rfl: 2 .  L-ARGININE PO, Take 900 mg by mouth daily. , Disp: , Rfl:  .  losartan (COZAAR) 100 MG tablet, TAKE 1 TABLET BY MOUTH ONCE DAILY, Disp: 90 tablet, Rfl: 2 .  potassium chloride (K-DUR) 10 MEQ tablet, TAKE 1 TABLET BY MOUTH ONCE DAILY, Disp: 90 tablet, Rfl: 2 .  rosuvastatin (CRESTOR) 10 MG tablet, Take 1 tablet (10 mg total) by mouth daily., Disp: 90 tablet, Rfl: 3  PAST MEDICAL  HISTORY: Past Medical History:  Diagnosis Date  . Acid reflux    hiatal hernia  . Diverticulitis   . Erectile dysfunction 04/20/2015  . Essential hypertension 04/20/2015  . History of colon polyps   . Hyperlipidemia 05/28/2015    PAST SURGICAL HISTORY: Past Surgical History:  Procedure Laterality Date  . APPENDECTOMY    . cataract surgery      FAMILY HISTORY: Family History  Problem Relation Age of Onset  . Stroke Father 70  . Sudden death Mother 7       unknown cause    SOCIAL HISTORY:  Social History   Socioeconomic History  . Marital status: Married    Spouse name: Not on file  . Number of children: 1  . Years of education: 12  . Highest education level: Not on file  Occupational History  . Occupation: Retired  Scientific laboratory technician  . Financial resource strain: Not on file  . Food insecurity:    Worry: Not on file    Inability: Not on file  . Transportation needs:    Medical: Not on file    Non-medical: Not on file  Tobacco Use  . Smoking status: Former Smoker    Types: Cigars  . Smokeless tobacco: Never Used  Substance and Sexual Activity  . Alcohol use: No  . Drug use: No  . Sexual activity: Not on file  Lifestyle  . Physical activity:    Days per week: Not on file    Minutes per session: Not on file  . Stress: Not on file  Relationships  . Social connections:    Talks on phone: Not on file    Gets together: Not on file    Attends religious service: Not on file    Active member of club or organization: Not on file    Attends meetings of clubs or organizations: Not on file    Relationship status: Not on file  . Intimate partner violence:    Fear of current or ex partner: Not on file    Emotionally abused: Not on file    Physically abused: Not on file    Forced sexual activity: Not on file  Other Topics Concern  . Not on file  Social History Narrative   Lives with wife    Caffeine use: 1-2 cups coffee in the am and 1 glass tea qpm      PHYSICAL EXAM  Vitals:   07/25/18 1134  SpO2: 99%  Weight: 161 lb 8 oz (73.3 kg)  Height: 5\' 4"  (1.626 m)    Body mass index is 27.72 kg/m.   General: The patient is well-developed and well-nourished and in no acute distress  Eyes:  Funduscopic exam shows normal optic discs and retinal vessels.  Neck: The neck is supple, no carotid bruits are noted.  The neck is nontender.  Cardiovascular: The heart has a regular rate and rhythm with a normal S1 and S2. There were no murmurs, gallops or rubs. Lungs are clear to auscultation.  Skin: Extremities are without significant edema.  Musculoskeletal:  Back is nontender  Neurologic Exam  Mental status: The patient is alert and oriented x 3 at the time of the examination. The patient has apparent normal recent and remote memory, with an apparently normal attention span and concentration ability.   Speech is normal.  Cranial nerves: Extraocular movements are full. Pupils are equal, round, and reactive to light and accomodation.  Visual fields are full.  Facial symmetry is present. There is good facial sensation to soft touch bilaterally.Facial strength is normal.  Trapezius and sternocleidomastoid strength is normal. No dysarthria is noted.  The tongue is midline, and the patient has symmetric elevation of the soft palate. No obvious hearing deficits are noted.  Motor:  Muscle bulk is normal.   Tone is normal. Strength is  5 / 5 in all 4 extremities.   Sensory: Sensory testing is intact to pinprick, soft touch and vibration sensation in all 4 extremities.  Coordination: Cerebellar testing reveals good finger-nose-finger and heel-to-shin bilaterally.  Gait and station: Station is normal.   Gait is normal. Tandem gait is normal. Romberg is negative.   Reflexes: Deep tendon reflexes are symmetric and normal bilaterally.   Plantar responses are flexor.    DIAGNOSTIC DATA (LABS, IMAGING, TESTING) - I reviewed patient records, labs,  notes, testing and imaging myself where available.  Lab Results  Component Value Date   WBC 5.6 07/23/2018   HGB 11.4 (L) 07/23/2018   HCT 32.1 (L) 07/23/2018   MCV 96 07/23/2018   PLT 184 07/23/2018      Component Value Date/Time   NA 140 07/23/2018 1005   K 4.6 07/23/2018 1005   CL 103 07/23/2018 1005   CO2 23 07/23/2018 1005   GLUCOSE 82 07/23/2018 1005   GLUCOSE 93 12/20/2015 1008   BUN 21 07/23/2018 1005   CREATININE 1.22 07/23/2018 1005   CREATININE 1.22 (H) 12/20/2015 1008   CALCIUM 9.3 07/23/2018 1005   PROT 6.7 07/23/2018 1005   ALBUMIN 4.2 07/23/2018 1005   AST 18 07/23/2018 1005   ALT 13 07/23/2018 1005   ALKPHOS 66 07/23/2018 1005   BILITOT 0.4 07/23/2018 1005   GFRNONAA 55 (L) 07/23/2018 1005   GFRAA 63 07/23/2018 1005   Lab Results  Component Value Date   CHOL 156 07/23/2018   HDL 60 07/23/2018   LDLCALC 80 07/23/2018   TRIG 82 07/23/2018   CHOLHDL 2.6 07/23/2018   Lab Results  Component Value Date   HGBA1C 5.5 07/23/2018   No results found for: VITAMINB12 Lab Results  Component Value Date   TSH 3.190 07/23/2018       ASSESSMENT AND PLAN  Transient alteration of awareness - Plan: EEG adult  Syncope, unspecified syncope type - Plan: EEG adult  Essential hypertension   In summary, Mr. Paci is an 85 year old man with episode of unresponsiveness.  From the history, it is uncertain where there he fell first and then had unresponsiveness or if the unresponsiveness preceded his fall.  Of note, he had felt ill at the time and had diarrhea.  We will check an EEG to determine if there is any epileptiform activity and treat if abnormal.  Additionally we will check an MRI of the brain of abnormal.  If another episode occurs, consider MRI of the brain without contrast,  carotid ultrasound and echocardiogram to further evaluate.  He will return to see me as needed and contact us if another episode occurs.  We will let her know the results of the  EEG.  Thank you for asking me to see Mr. Skalski.  Please let me know if I can be of further assistance with him or other patients in the future.   Teren Franckowiak A. Felecia Shelling, MD, [facility] 12/30/6331, 54:56 AM Certified in Neurology, Clinical Neurophysiology, Sleep Medicine, Pain Medicine and Neuroimaging  [facility] [address], [place], [place] [address] [phone]

## 2018-07-26 NOTE — Progress Notes (Signed)
Patient notified of results & recommendations. Expressed understanding.  Made nurse visit for BP check on 08/06/2018. Will make the 3 month follow up appointment with PCP at checkout.

## 2018-08-06 ENCOUNTER — Ambulatory Visit: Payer: Medicare Other

## 2018-08-13 ENCOUNTER — Ambulatory Visit: Payer: Medicare Other

## 2018-09-02 ENCOUNTER — Other Ambulatory Visit: Payer: Medicare Other

## 2018-09-16 ENCOUNTER — Encounter: Payer: Self-pay | Admitting: Cardiovascular Disease

## 2018-09-16 ENCOUNTER — Ambulatory Visit (INDEPENDENT_AMBULATORY_CARE_PROVIDER_SITE_OTHER): Payer: Medicare Other | Admitting: Cardiovascular Disease

## 2018-09-16 VITALS — BP 124/68 | HR 68 | Ht 64.0 in | Wt 161.0 lb

## 2018-09-16 DIAGNOSIS — I10 Essential (primary) hypertension: Secondary | ICD-10-CM | POA: Diagnosis not present

## 2018-09-16 DIAGNOSIS — E782 Mixed hyperlipidemia: Secondary | ICD-10-CM

## 2018-09-16 DIAGNOSIS — R55 Syncope and collapse: Secondary | ICD-10-CM

## 2018-09-16 NOTE — Patient Instructions (Signed)
Medication Instructions:  Your physician recommends that you continue on your current medications as directed. Please refer to the Current Medication list given to you today.  If you need a refill on your cardiac medications before your next appointment, please call your pharmacy.    Lab work: None Ordered    Testing/Procedures: Your physician has requested that you have an echocardiogram. Echocardiography is a painless test that uses sound waves to create images of your heart. It provides your doctor with information about the size and shape of your heart and how well your heart's chambers and valves are working. This procedure takes approximately one hour. There are no restrictions for this procedure.    Follow-Up: At [facility], you and your health needs are our priority.  As part of our continuing mission to provide you with exceptional heart care, we have created designated Provider Care Teams.  These Care Teams include your primary Cardiologist (physician) and Advanced Practice Providers (APPs -  Physician Assistants and Nurse Practitioners) who all work together to provide you with the care you need, when you need it. You will need a follow up appointment in:  1 years.  Please call our office 2 months in advance to schedule this appointment.  You may see Philip Nahser, MD or one of the following Advanced Practice Providers on your designated Care Team: Scott Weaver, PA-C Vin Bhagat, PA-C . Janine Hammond, NP   

## 2018-09-16 NOTE — Progress Notes (Signed)
Cardiology Office Note   Date:  09/16/2018   ID:  JERAN HILTZ, DOB [birth year], MRN [id number]  PCP:  Scot Jun, FNP  Cardiologist:   Mertie Moores, MD   No chief complaint on file.  Problem List  1. HTN 2. Hyperlipidemia    Danny [name] is a 85 y.o. male who presents to re-establish care. I saw him many years ago . Has a hx of HTN and hyperlipidema.  Mows yards, does lots of yard work .   September 17, 2015:  BP has been running high. Does not eat salt. Hb has been falling - was 12.8  Sept. 19, 2017:  Staying active.  BP and HR are well controlled . Hx of HTN and hyperlipidemia .  Still anemic.    March 21, 2017: BP is elevated .  Has not had his HCTZ for the past month or so  Still eating salty foods.    March 19, 2018: Patient is doing much better.  He is taking all his medications.  He has been trying to avoid eating so much salt.  September 16, 2018: Danny [name] is seen again for follow up of his HTN and hyperlipidemia. Takes his meds regularly .  In Dec. 2019, he was sitting on the commode and had a presyncopal / syncope episode  The episode was associated with an episode of diarrhea .  Does not sound cardiac  Has been exercising regularly since that tmie  Labs from primary MD Chol = 156 HDL = 60 LDL = 82 Trigs = 82   Past Medical History:  Diagnosis Date  . Acid reflux    hiatal hernia  . Diverticulitis   . Erectile dysfunction 04/20/2015  . Essential hypertension 04/20/2015  . History of colon polyps   . Hyperlipidemia 05/28/2015    Past Surgical History:  Procedure Laterality Date  . APPENDECTOMY    . cataract surgery       Current Outpatient Medications  Medication Sig Dispense Refill  . aspirin 81 MG tablet Take 81 mg by mouth daily.    . hydrochlorothiazide (HYDRODIURIL) 25 MG tablet TAKE 1 TABLET BY MOUTH ONCE DAILY 90 tablet 2  . L-ARGININE PO Take 900 mg by mouth daily.     Marland Kitchen losartan (COZAAR) 100 MG tablet TAKE 1  TABLET BY MOUTH ONCE DAILY 90 tablet 2  . potassium chloride (K-DUR) 10 MEQ tablet TAKE 1 TABLET BY MOUTH ONCE DAILY 90 tablet 2  . rosuvastatin (CRESTOR) 10 MG tablet Take 1 tablet (10 mg total) by mouth daily. 90 tablet 3   No current facility-administered medications for this visit.     Allergies:   Lipitor [atorvastatin]    Social History:  The patient  reports that he has quit smoking. His smoking use included cigars. He has never used smokeless tobacco. He reports that he does not drink alcohol or use drugs.   Family History:  The patient's family history includes Stroke (age of onset: 60) in his father; Sudden death (age of onset: 53) in his mother.    ROS:  Please see the history of present illness.     Physical Exam: Blood pressure 124/68, pulse 68, height 5\' 4"  (1.626 m), weight 161 lb (73 kg), SpO2 97 %.  GEN:  Elderly man,  NAD  HEENT: Normal NECK: No JVD; No carotid bruits LYMPHATICS: No lymphadenopathy CARDIAC: RRR , no murmurs, rubs, gallops RESPIRATORY:  Clear to auscultation without rales, wheezing or rhonchi  ABDOMEN: Soft,  non-tender, non-distended MUSCULOSKELETAL:  No edema; No deformity  SKIN: Warm and dry NEUROLOGIC:  Alert and oriented x 3   EKG:     Recent Labs: 07/23/2018: ALT 13; BUN 21; Creatinine, Ser 1.22; Hemoglobin 11.4; Platelets 184; Potassium 4.6; Sodium 140; TSH 3.190    Lipid Panel    Component Value Date/Time   CHOL 156 07/23/2018 1005   TRIG 82 07/23/2018 1005   HDL 60 07/23/2018 1005   CHOLHDL 2.6 07/23/2018 1005   CHOLHDL 3.6 12/20/2015 1008   VLDL 23 12/20/2015 1008   LDLCALC 80 07/23/2018 1005      Wt Readings from Last 3 Encounters:  09/16/18 161 lb (73 kg)  07/25/18 161 lb 8 oz (73.3 kg)  07/23/18 160 lb 12.8 oz (72.9 kg)      Other studies Reviewed: Additional studies/ records that were reviewed today include: . Review of the above records demonstrates:    ASSESSMENT AND PLAN:  1.  Syncope:   His episode of  syncope sounds like it was related to his diarrhea and vagal stimulation.  He has had hypertension for years.  We do not have a recent echocardiogram.  We will get an echocardiogram for further assessment of his left ventricular function-especially in the setting of syncope. Will see him in 1 year,  Sooner if needed.   2.  Essential Hypertension:       Blood pressure is well controlled.  3.   Hyperlipidemia:       Lipids are well controlled.   Current medicines are reviewed at length with the patient today.  The patient does not have concerns regarding medicines.  The following changes have been made:  no change  Labs/ tests ordered today include:   Orders Placed This Encounter  Procedures  . ECHOCARDIOGRAM COMPLETE   Disposition:      Mertie Moores, MD  09/16/2018 3:00 PM    [address], [place], [place]  [address] Phone: [phone]; Fax: [phone]   [facility]  [address] [place], [place]  [address] [phone]   Fax [phone]

## 2018-09-24 ENCOUNTER — Other Ambulatory Visit: Payer: Self-pay

## 2018-09-24 ENCOUNTER — Ambulatory Visit (HOSPITAL_COMMUNITY): Payer: Medicare Other | Attending: Cardiovascular Disease

## 2018-09-24 DIAGNOSIS — R55 Syncope and collapse: Secondary | ICD-10-CM | POA: Diagnosis present

## 2018-10-16 ENCOUNTER — Other Ambulatory Visit: Payer: Self-pay

## 2018-10-16 ENCOUNTER — Ambulatory Visit: Payer: Medicare Other | Admitting: Neurology

## 2018-10-16 DIAGNOSIS — R55 Syncope and collapse: Secondary | ICD-10-CM

## 2018-10-16 DIAGNOSIS — R404 Transient alteration of awareness: Secondary | ICD-10-CM

## 2018-10-16 NOTE — Progress Notes (Signed)
   [facility]  EEG (ELECTROENCEPHALOGRAM) REPORT   STUDY DATE: 10/16/2018  PATIENT NAME: Danny [name] DOB: January 30, 1934 MRN: [id number]  ORDERING CLINICIAN: Danisa Kopec A. Felecia Shelling, MD. PhD   TECHNOLOGIST: Silas [place], RPSGT TECHNIQUE: Electroencephalogram was recorded utilizing standard 10-20 system of lead placement and reformatted into average and bipolar montages.  RECORDING TIME: 22 min 21 sec  CLINICAL INFORMATION: 85 yo man with syncopal episodes and transient alteration of awareness   FINDINGS: A digital EEG was performed while the patient was awake and drowsy. While awake and most alert there was a 10 hz posterior dominant rhythm. Voltages and frequencies were symmetric.  There were no focal, lateralizing, epileptiform activity or seizures seen.  Photic stimulation had a normal driving response. Hyperventilation and recovery did not change the underlying rhythms. EKG channel shows NSR.  He became drowsy and briefly entered stage 1 sleep.  IMPRESSION: This is a normal EEG while the patient was awake and asleep   INTERPRETING PHYSICIAN:   Lastacia Solum A. Felecia Shelling, MD, PhD, [facility] Certified in Neurology, Clinical Neurophysiology, Sleep Medicine, Pain Medicine and Neuroimaging  [facility] [address], [place], [place] [address] [phone]

## 2018-10-17 ENCOUNTER — Telehealth: Payer: Self-pay | Admitting: *Deleted

## 2018-10-17 ENCOUNTER — Encounter: Payer: Self-pay | Admitting: *Deleted

## 2018-10-17 NOTE — Telephone Encounter (Signed)
-----   Message from Britt Bottom, MD sent at 10/16/2018  6:01 PM EDT ----- Please let the patient know that the EEG is normal.

## 2018-10-17 NOTE — Telephone Encounter (Signed)
I tried calling pt but went to automated message stating they were not accepting calls at this time. Checked DPR, pt wrote "text only" on preferred contact. Unable to text pt. I tried daughter on [place] but automated message states number does not work. I will mail pt letter about results. I included how he can sign up for mychart as well.

## 2019-01-22 ENCOUNTER — Telehealth: Payer: Self-pay | Admitting: *Deleted

## 2019-01-22 NOTE — Telephone Encounter (Signed)
Error

## 2019-01-23 ENCOUNTER — Ambulatory Visit: Payer: Medicare Other | Admitting: Neurology

## 2019-01-23 ENCOUNTER — Ambulatory Visit (INDEPENDENT_AMBULATORY_CARE_PROVIDER_SITE_OTHER): Payer: Medicare Other | Admitting: Neurology

## 2019-01-23 ENCOUNTER — Other Ambulatory Visit: Payer: Self-pay

## 2019-01-23 VITALS — BP 162/76 | HR 61 | Temp 98.2°F | Ht 64.0 in | Wt 161.5 lb

## 2019-01-23 DIAGNOSIS — R55 Syncope and collapse: Secondary | ICD-10-CM

## 2019-01-23 NOTE — Progress Notes (Signed)
[facility]  PATIENT: Danny [name] DOB: [birth year]  REFERRING DOCTOR OR PCP: Molli Barrows, FNP SOURCE: Patient, wife, notes from primary care  _________________________________   HISTORICAL  CHIEF COMPLAINT:  Chief Complaint  Patient presents with  . Follow-up    RM 12, alone. Last seen 07/25/18. Here to f/u on previous episode of syncope. EEG was normal. No further episodes of syncope since last seen.     HISTORY OF PRESENT ILLNESS:  Since the last visit, he denies any more episodes of syncope.   He had one episode December 2019 and another many years ago.    Of note, the day he had the syncope last year, he was having GI issues with diarrhea and felt he was probably dehydrated.  The event occurred as she was standing up because of the need to defecate.  He had an EEG 10/16/2018 which was normal.  He denies postural lightheadedness upon standing.  He denies any difficulty with his gait.  He has not had any falls.  He denies any new medical issues.   From 07/25/2018: I had the pleasure of seeing your patient, Danny [name], at [facility] for neurologic consultation.  He is an 85 year old man with an episode of unresponsiveness.  On Decemebr 21, 2019 he was watching TV and needed to use the bathroom.   He felt poor while walking to the bedroom and laid down. He was not lightheaded.    However, he needed to use the bathroom to defecate so he got up to go to the commode.   He sat down and had diarrhea.   Next thing he knows he was on the floor.Marland Kitchen   He does not know if he fell and then became unresponsive or if he became unresponsive and then fell.   He was found by his wife who heard the fall (but no preceding sound) on the floor and he had a contusion near the vertex of the head.    He was completely unresponsive about 5 minutes and then was groggy 15-20 minutes more.   She was able to get him cleaned off in the shower and he went to bed.      He felt better by that time and rested but did not sleep.      About 12 years ago, he had an episode of syncope where he was working.   Of note he was feeling sick and had taken Sudafed,  He lost consciousness when he stood and quickly got back to baseline,    He went to ED after he was told he was dehydrated and received IV fluids.    He has no episodes of presyncope or syncope since until the current episode.    He feels completely baseline currently (after an additional day of feeling more tired).       He is fairly healthy with essential hypertension and elevated cholesterol.    He has never had an MI or other serious medical issue.     REVIEW OF SYSTEMS: Constitutional: No fevers, chills, sweats, or change in appetite Eyes: No visual changes, double vision, eye pain Ear, nose and throat: No hearing loss, ear pain, nasal congestion, sore throat Cardiovascular: No chest pain, palpitations Respiratory: No shortness of breath at rest or with exertion.   No wheezes GastrointestinaI: No nausea, vomiting, diarrhea, abdominal pain, fecal incontinence Genitourinary: No dysuria, urinary retention or frequency.  No nocturia. Musculoskeletal: No neck pain, back pain Integumentary: No rash, pruritus,  skin lesions Neurological: as above Psychiatric: No depression at this time.  No anxiety Endocrine: No palpitations, diaphoresis, change in appetite, change in weigh or increased thirst Hematologic/Lymphatic: No anemia, purpura, petechiae. Allergic/Immunologic: No itchy/runny eyes, nasal congestion, recent allergic reactions, rashes  ALLERGIES: Allergies  Allergen Reactions  . Lipitor [Atorvastatin]     Muscle soreness    HOME MEDICATIONS:  Current Outpatient Medications:  .  aspirin 81 MG tablet, Take 81 mg by mouth daily., Disp: , Rfl:  .  hydrochlorothiazide (HYDRODIURIL) 25 MG tablet, TAKE 1 TABLET BY MOUTH ONCE DAILY, Disp: 90 tablet, Rfl: 2 .  L-ARGININE PO, Take 900 mg by mouth  daily. , Disp: , Rfl:  .  losartan (COZAAR) 100 MG tablet, TAKE 1 TABLET BY MOUTH ONCE DAILY, Disp: 90 tablet, Rfl: 2 .  potassium chloride (K-DUR) 10 MEQ tablet, TAKE 1 TABLET BY MOUTH ONCE DAILY, Disp: 90 tablet, Rfl: 2 .  rosuvastatin (CRESTOR) 10 MG tablet, Take 1 tablet (10 mg total) by mouth daily., Disp: 90 tablet, Rfl: 3  PAST MEDICAL HISTORY: Past Medical History:  Diagnosis Date  . Acid reflux    hiatal hernia  . Diverticulitis   . Erectile dysfunction 04/20/2015  . Essential hypertension 04/20/2015  . History of colon polyps   . Hyperlipidemia 05/28/2015    PAST SURGICAL HISTORY: Past Surgical History:  Procedure Laterality Date  . APPENDECTOMY    . cataract surgery      FAMILY HISTORY: Family History  Problem Relation Age of Onset  . Stroke Father 62  . Sudden death Mother 50       unknown cause    SOCIAL HISTORY:  Social History   Socioeconomic History  . Marital status: Married    Spouse name: Not on file  . Number of children: 1  . Years of education: 54  . Highest education level: Not on file  Occupational History  . Occupation: Retired  Scientific laboratory technician  . Financial resource strain: Not on file  . Food insecurity    Worry: Not on file    Inability: Not on file  . Transportation needs    Medical: Not on file    Non-medical: Not on file  Tobacco Use  . Smoking status: Former Smoker    Types: Cigars  . Smokeless tobacco: Never Used  Substance and Sexual Activity  . Alcohol use: No  . Drug use: No  . Sexual activity: Not on file  Lifestyle  . Physical activity    Days per week: Not on file    Minutes per session: Not on file  . Stress: Not on file  Relationships  . Social Herbalist on phone: Not on file    Gets together: Not on file    Attends religious service: Not on file    Active member of club or organization: Not on file    Attends meetings of clubs or organizations: Not on file    Relationship status: Not on file  .  Intimate partner violence    Fear of current or ex partner: Not on file    Emotionally abused: Not on file    Physically abused: Not on file    Forced sexual activity: Not on file  Other Topics Concern  . Not on file  Social History Narrative   Lives with wife    Caffeine use: 1-2 cups coffee in the am and 1 glass tea qpm     PHYSICAL EXAM  Vitals:  01/23/19 1057  BP: (!) 162/76  Pulse: 61  Temp: 98.2 F (36.8 C)  Weight: 161 lb 8 oz (73.3 kg)  Height: 5\' 4"  (1.626 m)    Body mass index is 27.72 kg/m.   General: The patient is well-developed and well-nourished and in no acute distress  Neck: The neck is supple, no carotid bruits are noted.  The neck is nontender.  Cardiovascular: The heart has a regular rate and rhythm with a normal S1 and S2. There were no murmurs, gallops or rubs.   Neurologic Exam  Mental status: The patient is alert and oriented x 3 at the time of the examination. The patient has apparent normal recent and remote memory, with an apparently normal attention span and concentration ability.   Speech is normal.  Cranial nerves: Extraocular movements are full.  There is good facial sensation to soft touch bilaterally.Facial strength is normal.  Trapezius and sternocleidomastoid strength is normal. No dysarthria is noted.   No obvious hearing deficits are noted.  Motor:  Muscle bulk is normal.   Tone is normal. Strength is  5 / 5 in all 4 extremities.   Sensory: Sensory testing is intact to touch x 4  Coordination: Cerebellar testing reveals good finger-nose-finger and heel-to-shin bilaterally.  Gait and station: Station is normal.   Gait is normal for age. Tandem gait is normal for age. Romberg is negative.   Reflexes: Deep tendon reflexes are symmetric and normal bilaterally.   Plantar responses are flexor.    DIAGNOSTIC DATA (LABS, IMAGING, TESTING) - I reviewed patient records, labs, notes, testing and imaging myself where available.  Lab  Results  Component Value Date   WBC 5.6 07/23/2018   HGB 11.4 (L) 07/23/2018   HCT 32.1 (L) 07/23/2018   MCV 96 07/23/2018   PLT 184 07/23/2018      Component Value Date/Time   NA 140 07/23/2018 1005   K 4.6 07/23/2018 1005   CL 103 07/23/2018 1005   CO2 23 07/23/2018 1005   GLUCOSE 82 07/23/2018 1005   GLUCOSE 93 12/20/2015 1008   BUN 21 07/23/2018 1005   CREATININE 1.22 07/23/2018 1005   CREATININE 1.22 (H) 12/20/2015 1008   CALCIUM 9.3 07/23/2018 1005   PROT 6.7 07/23/2018 1005   ALBUMIN 4.2 07/23/2018 1005   AST 18 07/23/2018 1005   ALT 13 07/23/2018 1005   ALKPHOS 66 07/23/2018 1005   BILITOT 0.4 07/23/2018 1005   GFRNONAA 55 (L) 07/23/2018 1005   GFRAA 63 07/23/2018 1005   Lab Results  Component Value Date   CHOL 156 07/23/2018   HDL 60 07/23/2018   LDLCALC 80 07/23/2018   TRIG 82 07/23/2018   CHOLHDL 2.6 07/23/2018   Lab Results  Component Value Date   HGBA1C 5.5 07/23/2018   No results found for: [id number] Lab Results  Component Value Date   TSH 3.190 07/23/2018       ASSESSMENT AND PLAN    1. Syncope, unspecified syncope type     1.   He has not had any more symptoms and his EEG was normal.  Most likely the episode of syncope late last year was due to a vasovagal event combined with dehydration. 2.  He will follow-up as needed if he has new or worsening symptoms.  He is advised to call if he has another spell of syncope without a good explanation.  Eva Griffo A. Felecia Shelling, MD, [facility] 9/79/8921, 19:41 AM Certified in Neurology, Clinical Neurophysiology, Sleep Medicine, Pain Medicine and  Neuroimaging  [facility] [address], [place], [place] [address] [phone]

## 2019-03-08 ENCOUNTER — Other Ambulatory Visit: Payer: Self-pay | Admitting: Cardiovascular Disease

## 2019-07-18 ENCOUNTER — Telehealth: Payer: Self-pay

## 2019-07-18 NOTE — Telephone Encounter (Signed)
Copied from [place] [id number]. Topic: Appointment Scheduling - Scheduling Inquiry for Clinic >> Jul 18, 2019  4:37 PM Sheran Luz wrote: Patient's wife [name] Basta calling to inquire if patient could potentially see Dr. Sharlet Salina as a PCP. They are aware Dr. Sharlet Salina is not accepting new patients right now.

## 2019-07-22 ENCOUNTER — Other Ambulatory Visit: Payer: Self-pay

## 2019-07-22 ENCOUNTER — Ambulatory Visit
Admission: EM | Admit: 2019-07-22 | Discharge: 2019-07-22 | Disposition: A | Discharge status: Home / Self Care | Payer: Medicare PPO | Attending: Emergency Medicine | Admitting: Emergency Medicine

## 2019-07-22 ENCOUNTER — Encounter: Payer: Self-pay | Admitting: Emergency Medicine

## 2019-07-22 DIAGNOSIS — M545 Low back pain, unspecified: Secondary | ICD-10-CM

## 2019-07-22 DIAGNOSIS — I10 Essential (primary) hypertension: Secondary | ICD-10-CM | POA: Diagnosis not present

## 2019-07-22 LAB — POCT URINALYSIS DIP (MANUAL ENTRY)
Bilirubin, UA: NEGATIVE
Blood, UA: NEGATIVE
Glucose, UA: NEGATIVE mg/dL
Ketones, POC UA: NEGATIVE mg/dL
Leukocytes, UA: NEGATIVE
Nitrite, UA: NEGATIVE
Protein Ur, POC: 100 mg/dL — AB
Spec Grav, UA: 1.015 (ref 1.010–1.025)
Urobilinogen, UA: 0.2 E.U./dL
pH, UA: 6 (ref 5.0–8.0)

## 2019-07-22 MED ORDER — KETOROLAC TROMETHAMINE 30 MG/ML IJ SOLN
30.0000 mg | Freq: Once | INTRAMUSCULAR | Status: AC
  Administered 2019-07-22: 30 mg via INTRAMUSCULAR

## 2019-07-22 NOTE — ED Provider Notes (Signed)
[facility]    CSN: [id number] Arrival date & time: 07/22/19  1410      History   Chief Complaint Chief Complaint  Patient presents with  . Back Pain    HPI Danny [name] is a 86 y.o. male with history of hypertension, renal calculi presenting for bilateral low back pain for the last 3 to 4 days.  States is intermittent in nature: Unknown exacerbating, alleviating factors.  Patient denying active pain today.  Denies trauma, fall.  No fever, saddle anesthesia, change in bowel or bladder habit.  Wanting to make sure "it is not my kidneys ".  Tried Tylenol with minimal relief of pain.   Past Medical History:  Diagnosis Date  . Acid reflux    hiatal hernia  . Diverticulitis   . Erectile dysfunction 04/20/2015  . Essential hypertension 04/20/2015  . History of colon polyps   . Hyperlipidemia 05/28/2015    Patient Active Problem List   Diagnosis Date Noted  . Transient alteration of awareness 07/25/2018  . Syncope 07/25/2018  . RUQ pain 10/19/2016  . Hyperlipidemia 05/28/2015  . Medicare annual wellness visit, subsequent 04/20/2015  . Erectile dysfunction 04/20/2015  . Essential hypertension 04/20/2015    Past Surgical History:  Procedure Laterality Date  . APPENDECTOMY    . cataract surgery         Home Medications    Prior to Admission medications   Medication Sig Start Date End Date Taking? Authorizing Provider  aspirin 81 MG tablet Take 81 mg by mouth daily.    [provider]  hydrochlorothiazide (HYDRODIURIL) 25 MG tablet Take 1 tablet by mouth once daily 03/10/19   Nahser, Wonda Cheng, MD  L-ARGININE PO Take 900 mg by mouth daily.     [provider]  losartan (COZAAR) 100 MG tablet Take 1 tablet by mouth once daily 03/10/19   Nahser, Wonda Cheng, MD  potassium chloride (K-DUR) 10 MEQ tablet Take 1 tablet by mouth once daily 03/10/19   Nahser, Wonda Cheng, MD  rosuvastatin (CRESTOR) 10 MG tablet Take 1 tablet (10 mg total) by mouth  daily. 10/15/17 09/16/18  Daune Perch, NP    Family History Family History  Problem Relation Age of Onset  . Stroke Father 74  . Sudden death Mother 69       unknown cause    Social History Social History   Tobacco Use  . Smoking status: Former Smoker    Types: Cigars  . Smokeless tobacco: Never Used  Substance Use Topics  . Alcohol use: No  . Drug use: No     Allergies   Lipitor [atorvastatin]   Review of Systems As per HPI   Physical Exam Triage Vital Signs ED Triage Vitals [07/22/19 1427]  Enc Vitals Group     BP (!) 175/70     Pulse Rate 81     Resp 16     Temp 97.8 F (36.6 C)     Temp Source Temporal     SpO2 97 %     Weight      Height      Head Circumference      Peak Flow      Pain Score 0     Pain Loc      Pain Edu?      Excl. in [place]?    No data found.  Updated Vital Signs BP (!) 175/70 (BP Location: Left Arm)   Pulse 81   Temp 97.8  F (36.6 C) (Temporal)   Resp 16   SpO2 97%   Visual Acuity Right Eye Distance:   Left Eye Distance:   Bilateral Distance:    Right Eye Near:   Left Eye Near:    Bilateral Near:     Physical Exam Constitutional:      General: He is not in acute distress.    Appearance: He is not ill-appearing or diaphoretic.  HENT:     Head: Normocephalic and atraumatic.  Eyes:     General: No scleral icterus.    Conjunctiva/sclera: Conjunctivae normal.     Pupils: Pupils are equal, round, and reactive to light.  Cardiovascular:     Rate and Rhythm: Normal rate.  Pulmonary:     Effort: Pulmonary effort is normal. No respiratory distress.     Breath sounds: No wheezing.  Abdominal:     General: Bowel sounds are normal.     Tenderness: There is no abdominal tenderness. There is no right CVA tenderness or left CVA tenderness.  Musculoskeletal:        General: Normal range of motion.     Right lower leg: No edema.     Left lower leg: No edema.     Comments: Diffuse right sided low back pain sparing spinous  process, PSIS.  No fluctuance, mass, warmth appreciated.  Skin:    Capillary Refill: Capillary refill takes less than 2 seconds.     Coloration: Skin is not jaundiced or pale.     Findings: No rash.  Neurological:     General: No focal deficit present.     Mental Status: He is alert and oriented to person, place, and time.      UC Treatments / Results  Labs (all labs ordered are listed, but only abnormal results are displayed) Labs Reviewed  POCT URINALYSIS DIP (MANUAL ENTRY) - Abnormal; Notable for the following components:      Result Value   Protein Ur, POC =100 (*)    All other components within normal limits    EKG   Radiology No results found.  Procedures Procedures (including critical care time)  Medications Ordered in UC Medications  ketorolac (TORADOL) 30 MG/ML injection 30 mg (30 mg Intramuscular Given 07/22/19 1615)    Initial Impression / Assessment and Plan / UC Course  I have reviewed the triage vital signs and the nursing notes.  Pertinent labs & imaging results that were available during my care of the patient were reviewed by me and considered in my medical decision making (see chart for details).     Patient afebrile, appears well in office today.  No urinary symptoms, abdominal pain.  Only pain reproduced on exam is right low lumbar.  Patient denies sciatica.  Urine dipstick done in office that patient was aggressive showing protein: Discussed this could be related to renal calculi.  Given Toradol shot in office which he tolerated well.  Will increase oral hydration, monitor urine output and follow-up with PCP as needed.  Return precautions discussed, patient verbalized understanding and is agreeable to plan. Final Clinical Impressions(s) / UC Diagnoses   Final diagnoses:  Acute right-sided low back pain without sciatica     Discharge Instructions     We gave you a shot today for pain. Important drink plenty of water. No sign of infection in  your urine at this time. Important to follow-up with your PCP if symptoms worsen/persist.    ED Prescriptions    None  PDMP not reviewed this encounter.   Hall[place]Potvin, [place], [place] 07/22/19 2105

## 2019-07-22 NOTE — Discharge Instructions (Addendum)
We gave you a shot today for pain. Important drink plenty of water. No sign of infection in your urine at this time. Important to follow-up with your PCP if symptoms worsen/persist.

## 2019-07-22 NOTE — ED Triage Notes (Addendum)
Pt presents to [facility] for assessment of bilateral lower back pain x 3-4 days, intermittent in nature, denies pain at this time.

## 2019-07-22 NOTE — ED Notes (Signed)
Patient able to ambulate independently  

## 2019-08-04 DIAGNOSIS — L570 Actinic keratosis: Secondary | ICD-10-CM | POA: Diagnosis not present

## 2019-08-04 DIAGNOSIS — L814 Other melanin hyperpigmentation: Secondary | ICD-10-CM | POA: Diagnosis not present

## 2019-08-04 DIAGNOSIS — L821 Other seborrheic keratosis: Secondary | ICD-10-CM | POA: Diagnosis not present

## 2019-08-04 DIAGNOSIS — D1801 Hemangioma of skin and subcutaneous tissue: Secondary | ICD-10-CM | POA: Diagnosis not present

## 2019-08-04 DIAGNOSIS — D225 Melanocytic nevi of trunk: Secondary | ICD-10-CM | POA: Diagnosis not present

## 2019-08-04 DIAGNOSIS — Z85828 Personal history of other malignant neoplasm of skin: Secondary | ICD-10-CM | POA: Diagnosis not present

## 2019-08-04 DIAGNOSIS — D485 Neoplasm of uncertain behavior of skin: Secondary | ICD-10-CM | POA: Diagnosis not present

## 2019-09-12 ENCOUNTER — Other Ambulatory Visit: Payer: Self-pay | Admitting: Cardiovascular Disease

## 2019-09-15 ENCOUNTER — Encounter: Payer: Self-pay | Admitting: Cardiovascular Disease

## 2019-09-15 NOTE — Progress Notes (Signed)
Cardiology Office Note   Date:  09/16/2019   ID:  Danny [name], DOB 1934-06-19, MRN [id number]  PCP:  Scot Jun, FNP  Cardiologist:   Mertie Moores, MD   Chief Complaint  Patient presents with  . Loss of Consciousness  . Hyperlipidemia   Problem List  1. HTN 2. Hyperlipidemia    Danny [name] is a 86 y.o. male who presents to re-establish care. I saw him many years ago . Has a hx of HTN and hyperlipidema.  Mows yards, does lots of yard work .   September 17, 2015:  BP has been running high. Does not eat salt. Hb has been falling - was 12.8  Sept. 19, 2017:  Staying active.  BP and HR are well controlled . Hx of HTN and hyperlipidemia .  Still anemic.    March 21, 2017: BP is elevated .  Has not had his HCTZ for the past month or so  Still eating salty foods.    March 19, 2018: Patient is doing much better.  He is taking all his medications.  He has been trying to avoid eating so much salt.  September 16, 2018: Danny [name] is seen again for follow up of his HTN and hyperlipidemia. Takes his meds regularly .  In Dec. 2019, he was sitting on the commode and had a presyncopal / syncope episode  The episode was associated with an episode of diarrhea .  Does not sound cardiac  Has been exercising regularly since that tmie  Labs from primary MD Chol = 156 HDL = 60 LDL = 82 Trigs = 82   September 16, 2019 Danny [name] is seen for follow up of his syncope / vasovagal syncope  No cp or dyspnea.  No recent syncope . Stays active.    Working out daily ,  Exercises. , is currently paitnting the living room .  VS looks  Past Medical History:  Diagnosis Date  . Acid reflux    hiatal hernia  . Diverticulitis   . Erectile dysfunction 04/20/2015  . Essential hypertension 04/20/2015  . History of colon polyps   . Hyperlipidemia 05/28/2015    Past Surgical History:  Procedure Laterality Date  . APPENDECTOMY    . cataract surgery       Current Outpatient  Medications  Medication Sig Dispense Refill  . aspirin 81 MG tablet Take 81 mg by mouth daily.    . hydrochlorothiazide (HYDRODIURIL) 25 MG tablet Take 1 tablet by mouth once daily 90 tablet 0  . L-ARGININE PO Take 900 mg by mouth daily.     Marland Kitchen losartan (COZAAR) 100 MG tablet Take 1 tablet by mouth once daily 90 tablet 0  . potassium chloride (KLOR-CON) 10 MEQ tablet Take 1 tablet by mouth once daily 90 tablet 0  . rosuvastatin (CRESTOR) 10 MG tablet Take 1 tablet (10 mg total) by mouth daily. 90 tablet 3   No current facility-administered medications for this visit.    Allergies:   Lipitor [atorvastatin]    Social History:  The patient  reports that he has quit smoking. His smoking use included cigars. He has never used smokeless tobacco. He reports that he does not drink alcohol or use drugs.   Family History:  The patient's family history includes Stroke (age of onset: 65) in his father; Sudden death (age of onset: 39) in his mother.    ROS:  Please see the history of present illness.    Physical Exam: Blood  pressure 126/62, pulse 68, height 5\' 4"  (1.626 m), weight 161 lb (73 kg), SpO2 97 %.  GEN:   Elderly male,  NAD  HEENT: Normal NECK: No JVD; No carotid bruits LYMPHATICS: No lymphadenopathy CARDIAC: RRR , soft systolic murmur  RESPIRATORY:  Clear to auscultation without rales, wheezing or rhonchi  ABDOMEN: Soft, non-tender, non-distended MUSCULOSKELETAL:  No edema; No deformity  SKIN: Warm and dry NEUROLOGIC:  Alert and oriented x 3   EKG:   September 16, 2019: Normal sinus rhythm at 68.  No ST or T wave changes.  Recent Labs: No results found for requested labs within last 8760 hours.    Lipid Panel    Component Value Date/Time   CHOL 156 07/23/2018 1005   TRIG 82 07/23/2018 1005   HDL 60 07/23/2018 1005   CHOLHDL 2.6 07/23/2018 1005   CHOLHDL 3.6 12/20/2015 1008   VLDL 23 12/20/2015 1008   LDLCALC 80 07/23/2018 1005      Wt Readings from Last 3 Encounters:   09/16/19 161 lb (73 kg)  01/23/19 161 lb 8 oz (73.3 kg)  09/16/18 161 lb (73 kg)      Other studies Reviewed: Additional studies/ records that were reviewed today include: . Review of the above records demonstrates:    ASSESSMENT AND PLAN:  1.  Syncope:   He is not had any recurrent episodes of syncope.  We will continue to follow.  2.  Essential Hypertension:       Blood pressure looks great.  Continue current medications.  3.   Hyperlipidemia:       He is on rosuvastatin.  Will check lipids, liver enzymes, basic metabolic profile today.  Current medicines are reviewed at length with the patient today.  The patient does not have concerns regarding medicines.  The following changes have been made:  no change  Labs/ tests ordered today include:   Orders Placed This Encounter  Procedures  . Lipid Profile  . Basic Metabolic Panel (BMET)  . Hepatic function panel  . EKG 12-Lead   Disposition:   I will see him again in 1 year.   Mertie Moores, MD  09/16/2019 5:39 PM    [facility] [place], [place],   [address] Phone: [phone]; Fax: [phone]

## 2019-09-16 ENCOUNTER — Ambulatory Visit: Payer: Medicare PPO | Admitting: Cardiovascular Disease

## 2019-09-16 ENCOUNTER — Encounter (INDEPENDENT_AMBULATORY_CARE_PROVIDER_SITE_OTHER): Payer: Self-pay

## 2019-09-16 ENCOUNTER — Encounter: Payer: Self-pay | Admitting: Cardiovascular Disease

## 2019-09-16 ENCOUNTER — Other Ambulatory Visit: Payer: Self-pay

## 2019-09-16 VITALS — BP 126/62 | HR 68 | Ht 64.0 in | Wt 161.0 lb

## 2019-09-16 DIAGNOSIS — E782 Mixed hyperlipidemia: Secondary | ICD-10-CM | POA: Diagnosis not present

## 2019-09-16 DIAGNOSIS — I10 Essential (primary) hypertension: Secondary | ICD-10-CM

## 2019-09-16 DIAGNOSIS — I739 Peripheral vascular disease, unspecified: Secondary | ICD-10-CM | POA: Diagnosis not present

## 2019-09-16 NOTE — Patient Instructions (Signed)
Medication Instructions:  Your physician recommends that you continue on your current medications as directed. Please refer to the Current Medication list given to you today.  *If you need a refill on your cardiac medications before your next appointment, please call your pharmacy*   Lab Work: TODAY - cholesterol, liver panel, basic metabolic panel If you have labs (blood work) drawn today and your tests are completely normal, you will receive your results only by: . MyChart Message (if you have MyChart) OR . A paper copy in the mail If you have any lab test that is abnormal or we need to change your treatment, we will call you to review the results.    Testing/Procedures: None Ordered   Follow-Up: At [facility], you and your health needs are our priority.  As part of our continuing mission to provide you with exceptional heart care, we have created designated Provider Care Teams.  These Care Teams include your primary Cardiologist (physician) and Advanced Practice Providers (APPs -  Physician Assistants and Nurse Practitioners) who all work together to provide you with the care you need, when you need it.  We recommend signing up for the patient portal called "MyChart".  Sign up information is provided on this After Visit Summary.  MyChart is used to connect with patients for Virtual Visits (Telemedicine).  Patients are able to view lab/test results, encounter notes, upcoming appointments, etc.  Non-urgent messages can be sent to your provider as well.   To learn more about what you can do with MyChart, go to [email or web address].    Your next appointment:   1 year(s)  The format for your next appointment:   In Person  Provider:   You may see Philip Nahser, MD or one of the following Advanced Practice Providers on your designated Care Team:    Scott Weaver, PA-C  Vin Bhagat, PA-C  Janine Hammond, NP     

## 2019-09-17 LAB — BASIC METABOLIC PANEL
BUN/Creatinine Ratio: 17 (ref 10–24)
BUN: 22 mg/dL (ref 8–27)
CO2: 24 mmol/L (ref 20–29)
Calcium: 9 mg/dL (ref 8.6–10.2)
Chloride: 102 mmol/L (ref 96–106)
Creatinine, Ser: 1.32 mg/dL — ABNORMAL HIGH (ref 0.76–1.27)
GFR calc Af Amer: 57 mL/min/{1.73_m2} — ABNORMAL LOW (ref >59)
GFR calc non Af Amer: 50 mL/min/{1.73_m2} — ABNORMAL LOW (ref >59)
Glucose: 98 mg/dL (ref 65–99)
Potassium: 4.9 mmol/L (ref 3.5–5.2)
Sodium: 139 mmol/L (ref 134–144)

## 2019-09-17 LAB — LIPID PANEL
Chol/HDL Ratio: 5.2 ratio — ABNORMAL HIGH (ref 0.0–5.0)
Cholesterol, Total: 250 mg/dL — ABNORMAL HIGH (ref 100–199)
HDL: 48 mg/dL (ref >39)
LDL Chol Calc (NIH): 171 mg/dL — ABNORMAL HIGH (ref 0–99)
Triglycerides: 166 mg/dL — ABNORMAL HIGH (ref 0–149)
VLDL Cholesterol Cal: 31 mg/dL (ref 5–40)

## 2019-09-17 LAB — HEPATIC FUNCTION PANEL
ALT: 13 IU/L (ref 0–44)
AST: 20 IU/L (ref 0–40)
Albumin: 4.2 g/dL (ref 3.6–4.6)
Alkaline Phosphatase: 83 IU/L (ref 39–117)
Bilirubin Total: 0.4 mg/dL (ref 0.0–1.2)
Bilirubin, Direct: 0.11 mg/dL (ref 0.00–0.40)
Total Protein: 6.3 g/dL (ref 6.0–8.5)

## 2019-09-25 ENCOUNTER — Other Ambulatory Visit: Payer: Self-pay | Admitting: Nurse Practitioner

## 2019-09-25 MED ORDER — ROSUVASTATIN CALCIUM 10 MG PO TABS: 10.0000 mg | ORAL_TABLET | Freq: Every day | ORAL | 3 refills | Status: DC

## 2019-11-10 ENCOUNTER — Encounter: Payer: Self-pay | Admitting: Internal Medicine

## 2019-11-10 ENCOUNTER — Telehealth: Payer: Self-pay | Admitting: General Practice

## 2019-11-10 ENCOUNTER — Ambulatory Visit: Payer: Medicare PPO | Admitting: Internal Medicine

## 2019-11-10 VITALS — BP 118/62 | HR 70 | Temp 98.2°F | Ht 64.0 in | Wt 162.0 lb

## 2019-11-10 DIAGNOSIS — R1011 Right upper quadrant pain: Secondary | ICD-10-CM

## 2019-11-10 DIAGNOSIS — Z86010 Personal history of colonic polyps: Secondary | ICD-10-CM | POA: Diagnosis not present

## 2019-11-10 DIAGNOSIS — B029 Zoster without complications: Secondary | ICD-10-CM

## 2019-11-10 DIAGNOSIS — Z8719 Personal history of other diseases of the digestive system: Secondary | ICD-10-CM | POA: Diagnosis not present

## 2019-11-10 MED ORDER — VALACYCLOVIR HCL 1 G PO TABS: 1000.0000 mg | ORAL_TABLET | Freq: Two times a day (BID) | ORAL | 0 refills | Status: DC

## 2019-11-10 NOTE — Telephone Encounter (Signed)
   Daughter Linus Orn calling on behalf of patient. Patient would like to become a patient of Dr Sharlet Salina, his wife [name] Basta is a current patient  Please advise

## 2019-11-10 NOTE — Progress Notes (Signed)
Patient ID: Danny [name], male   DOB: Jan 26, 1934, 86 y.o.   MRN: [id number] HPI: Danny [name] is an 86 yo male with PMH of HTN, GERD, hx of colon polyps, hx of diverticulosis and prior diverticultitis who is seen to evaluate right upper quadrant pain which is now resolved but now left upper side pain.  He is here alone today.  He has GI history remote with Dr. Collene Mares prior to his living in [place].  He saw Dr. Juanell Fairly in [place] and had an upper endoscopy and colonoscopy last in 2014.  He recalls 1 or 2 prior colon polyps throughout the years.  He does not recall if he was told that he would need a surveillance colonoscopy.  Prior upper endoscopy revealed a small hiatal hernia.  He reports that late last week he developed right upper quadrant pain.  He was concerned that this may have been diverticulitis however he had 2 subsequent bowel movements on Friday and Saturday last week and the pain has completely resolved.  However in the last 24 to 36 hours he has developed severe pain in the left axillary line.  He reports this pain has been severe and it hurts when you touch his skin.  No rash.  His wife applied an over-the-counter cream which was very painful.  Does not hurt to take a deep breath.  No anterior chest pain.  No nausea or vomiting.  No fever or chills.  Bowel movements have been regular without blood or melena.  Good appetite.  Stable weight.  No dysphagia.  He has remote history of heartburn but none in many years.  He takes honey every morning which he has found to be helpful to prevent his reflux.  Past Medical History:  Diagnosis Date  . Acid reflux    hiatal hernia  . Diverticulitis   . Erectile dysfunction 04/20/2015  . Essential hypertension 04/20/2015  . History of colon polyps   . Hyperlipidemia 05/28/2015    Past Surgical History:  Procedure Laterality Date  . APPENDECTOMY    . cataract surgery      Outpatient Medications Prior to Visit  Medication Sig Dispense Refill  .  aspirin 81 MG tablet Take 81 mg by mouth daily.    . hydrochlorothiazide (HYDRODIURIL) 25 MG tablet Take 1 tablet by mouth once daily 90 tablet 0  . losartan (COZAAR) 100 MG tablet Take 1 tablet by mouth once daily 90 tablet 0  . potassium chloride (KLOR-CON) 10 MEQ tablet Take 1 tablet by mouth once daily 90 tablet 0  . rosuvastatin (CRESTOR) 10 MG tablet Take 1 tablet (10 mg total) by mouth daily. 90 tablet 3  . L-ARGININE PO Take 900 mg by mouth daily.      No facility-administered medications prior to visit.    Allergies  Allergen Reactions  . Lipitor [Atorvastatin]     Muscle soreness    Family History  Problem Relation Age of Onset  . Stroke Father 48  . Sudden death Mother 36       unknown cause    Social History   Tobacco Use  . Smoking status: Former Smoker    Types: Cigars  . Smokeless tobacco: Never Used  Substance Use Topics  . Alcohol use: No  . Drug use: No    ROS: As per history of present illness, otherwise negative  BP 118/62   Pulse 70   Temp 98.2 F (36.8 C)   Ht 5\' 4"  (1.626 m)  Wt 162 lb (73.5 kg)   BMI 27.81 kg/m  Gen: awake, alert, NAD HEENT: anicteric, op clear CV: RRR, no mrg Pulm: CTA b/l, in the left mid axillary line I did not not see a rash nor on the back, the skin is very tender to touch without obvious skin lesion Abd: soft, NT/ND, +BS throughout Ext: no c/c/e Neuro: nonfocal   RELEVANT LABS AND IMAGING: CBC    Component Value Date/Time   WBC 5.6 07/23/2018 1005   WBC 6.7 04/20/2015 1529   RBC 3.35 (L) 07/23/2018 1005   RBC 4.02 (L) 04/20/2015 1529   HGB 11.4 (L) 07/23/2018 1005   HCT 32.1 (L) 07/23/2018 1005   PLT 184 07/23/2018 1005   MCV 96 07/23/2018 1005   MCH 34.0 (H) 07/23/2018 1005   MCH 33.1 11/28/2013 1940   MCHC 35.5 07/23/2018 1005   MCHC 33.7 04/20/2015 1529   RDW 12.7 07/23/2018 1005   LYMPHSABS 1.6 07/23/2018 1005   MONOABS 0.2 11/28/2013 1940   EOSABS 0.1 07/23/2018 1005   BASOSABS 0.0  07/23/2018 1005    CMP     Component Value Date/Time   NA 139 09/16/2019 1614   K 4.9 09/16/2019 1614   CL 102 09/16/2019 1614   CO2 24 09/16/2019 1614   GLUCOSE 98 09/16/2019 1614   GLUCOSE 93 12/20/2015 1008   BUN 22 09/16/2019 1614   CREATININE 1.32 (H) 09/16/2019 1614   CREATININE 1.22 (H) 12/20/2015 1008   CALCIUM 9.0 09/16/2019 1614   PROT 6.3 09/16/2019 1614   ALBUMIN 4.2 09/16/2019 1614   AST 20 09/16/2019 1614   ALT 13 09/16/2019 1614   ALKPHOS 83 09/16/2019 1614   BILITOT 0.4 09/16/2019 1614   GFRNONAA 50 (L) 09/16/2019 1614   GFRAA 57 (L) 09/16/2019 1614   US ABDOMEN LIMITED - RIGHT UPPER QUADRANT   COMPARISON:  None.   FINDINGS: Gallbladder:   No gallstones or wall thickening visualized. No sonographic Murphy sign noted by sonographer. 1.9 mm, within normal limits   Common bile duct:   Diameter: 4.9 mm, within normal limits   Liver:   No focal lesion identified. Within normal limits in parenchymal echogenicity.   IMPRESSION: Negative right upper quadrant ultrasound.     Electronically Signed   By: San Morelle M.D.   On: 10/26/2016 14:23  ASSESSMENT/PLAN: 86 yo male with PMH of HTN, GERD, hx of colon polyps, hx of diverticulosis and prior diverticultitis who is seen to evaluate right upper quadrant pain which is now resolved but now left upper side pain.  1. RUQ pain --started last week and he was given an appointment in short order.  The right upper quadrant pain has completely resolved and he is not tender on exam.  However, if it recurs I asked that he notify me and we would proceed with a CT scan of the abdomen pelvis.  2.  Left chest wall pain in the mid axillary line without rash/shingles --most concerning for early onset shingles without rash at this point.  I am going to treat empirically with valacyclovir 1 g twice daily x7 days.  I have dose reduced from 3 g daily to 2 g daily based on GFR of approximately 50 on recent metabolic  panel.  Also asked that he contact his primary care, he reports he will be establishing with Dr. Sharlet Salina.  3.  GERD --history of without current GERD symptoms  4.  History of colon polyps --remote.  Last colonoscopy 2014.  He  will likely not require repeat colonoscopy for screening or surveillance.  He will follow-up as needed but let me know if he has recurrent abdominal pain      [id number], [address], [place],  [place] [address]

## 2019-11-10 NOTE — Patient Instructions (Signed)
We have sent the following medications to your pharmacy for you to pick up at your convenience: Valtrex 1000 mg twice daily x 7 days  Please contact your primary care physician for an appointment regarding your side pain; ? Shingles.  If your right upper quadrant pain comes back, let us know.  If you are age 84 or older, your body mass index should be between 23-30. Your Body mass index is 27.81 kg/m. If this is out of the aforementioned range listed, please consider follow up with your Primary Care Provider.  If you are age 50 or younger, your body mass index should be between 19-25. Your Body mass index is 27.81 kg/m. If this is out of the aformentioned range listed, please consider follow up with your Primary Care Provider.

## 2019-11-13 NOTE — Telephone Encounter (Signed)
Fine, no more than 1 new patient per day 

## 2019-11-20 ENCOUNTER — Ambulatory Visit: Payer: Medicare PPO | Admitting: Internal Medicine

## 2019-11-20 ENCOUNTER — Other Ambulatory Visit: Payer: Self-pay

## 2019-11-20 ENCOUNTER — Encounter: Payer: Self-pay | Admitting: Internal Medicine

## 2019-11-20 VITALS — BP 148/76 | HR 52 | Temp 98.2°F | Ht 64.0 in | Wt 161.2 lb

## 2019-11-20 DIAGNOSIS — E782 Mixed hyperlipidemia: Secondary | ICD-10-CM

## 2019-11-20 DIAGNOSIS — I10 Essential (primary) hypertension: Secondary | ICD-10-CM

## 2019-11-20 DIAGNOSIS — N1831 Chronic kidney disease, stage 3a: Secondary | ICD-10-CM

## 2019-11-20 NOTE — Progress Notes (Signed)
   Subjective:   Patient ID: Danny [name], male    DOB: Dec 08, 1933, 86 y.o.   MRN: [id number]  HPI The patient is an 86 YO man coming in new for continuation of care including high blood pressure (taking losartan and hctz, denies side effects, denies headaches or chest pains) and cholesterol (taking crestor, denies side effects, denies stroke or chest pain symptoms) and CKD stage 3 (with recent GFR 50, stable from last 2-3 years, has HTN but not DM, well controlled at this time). Very active and still working part time. Exercises daily.   PMH, [facility], social history reviewed and updated  Review of Systems  Constitutional: Negative.   HENT: Negative.   Eyes: Negative.   Respiratory: Negative for cough, chest tightness and shortness of breath.   Cardiovascular: Negative for chest pain, palpitations and leg swelling.  Gastrointestinal: Negative for abdominal distention, abdominal pain, constipation, diarrhea, nausea and vomiting.  Musculoskeletal: Negative.   Skin: Negative.   Neurological: Negative.   Psychiatric/Behavioral: Negative.     Objective:  Physical Exam Constitutional:      Appearance: He is well-developed.  HENT:     Head: Normocephalic and atraumatic.  Cardiovascular:     Rate and Rhythm: Normal rate and regular rhythm.  Pulmonary:     Effort: Pulmonary effort is normal. No respiratory distress.     Breath sounds: Normal breath sounds. No wheezing or rales.  Abdominal:     General: Bowel sounds are normal. There is no distension.     Palpations: Abdomen is soft.     Tenderness: There is no abdominal tenderness. There is no rebound.  Musculoskeletal:     Cervical back: Normal range of motion.  Skin:    General: Skin is warm and dry.  Neurological:     Mental Status: He is alert and oriented to person, place, and time.     Coordination: Coordination normal.     Vitals:   11/20/19 1458  BP: (!) 148/76  Pulse: (!) 52  Temp: 98.2 F (36.8 C)  SpO2: 100%    Weight: 161 lb 3.2 oz (73.1 kg)  Height: 5\' 4"  (1.626 m)    This visit occurred during the SARS-CoV-2 public health emergency.  Safety protocols were in place, including screening questions prior to the visit, additional usage of staff PPE, and extensive cleaning of exam room while observing appropriate contact time as indicated for disinfecting solutions.   Assessment & Plan:

## 2019-11-20 NOTE — Patient Instructions (Signed)
We do not needs labs today.

## 2019-11-21 DIAGNOSIS — N183 Chronic kidney disease, stage 3 unspecified: Secondary | ICD-10-CM | POA: Insufficient documentation

## 2019-11-21 DIAGNOSIS — N1831 Chronic kidney disease, stage 3a: Secondary | ICD-10-CM | POA: Insufficient documentation

## 2019-11-21 NOTE — Assessment & Plan Note (Signed)
Taking crestor and last lipids at goal. No change today.

## 2019-11-21 NOTE — Assessment & Plan Note (Signed)
BP at goal on losartan/hctz. Recent BMP consistent with prior.

## 2019-11-21 NOTE — Assessment & Plan Note (Signed)
GFR stable over the last 3-4 years of review. BP at goal and no DM. We did talk about the etiology of this likely and monitoring yearly for now. More often if changing.

## 2019-12-19 ENCOUNTER — Other Ambulatory Visit: Payer: Self-pay | Admitting: Cardiovascular Disease

## 2020-05-03 DIAGNOSIS — H524 Presbyopia: Secondary | ICD-10-CM | POA: Diagnosis not present

## 2020-05-03 DIAGNOSIS — Z961 Presence of intraocular lens: Secondary | ICD-10-CM | POA: Diagnosis not present

## 2020-07-30 DIAGNOSIS — D1801 Hemangioma of skin and subcutaneous tissue: Secondary | ICD-10-CM | POA: Diagnosis not present

## 2020-07-30 DIAGNOSIS — C4441 Basal cell carcinoma of skin of scalp and neck: Secondary | ICD-10-CM | POA: Diagnosis not present

## 2020-07-30 DIAGNOSIS — L821 Other seborrheic keratosis: Secondary | ICD-10-CM | POA: Diagnosis not present

## 2020-07-30 DIAGNOSIS — L814 Other melanin hyperpigmentation: Secondary | ICD-10-CM | POA: Diagnosis not present

## 2020-07-30 DIAGNOSIS — Z85828 Personal history of other malignant neoplasm of skin: Secondary | ICD-10-CM | POA: Diagnosis not present

## 2020-07-30 DIAGNOSIS — L570 Actinic keratosis: Secondary | ICD-10-CM | POA: Diagnosis not present

## 2020-09-25 ENCOUNTER — Other Ambulatory Visit: Payer: Self-pay | Admitting: Cardiovascular Disease

## 2020-09-29 ENCOUNTER — Telehealth: Payer: Self-pay | Admitting: Internal Medicine

## 2020-09-29 NOTE — Telephone Encounter (Signed)
Called pt to schedule AWV with [facility]. No answer and patients voice mail was not set up to LVM.

## 2020-09-30 ENCOUNTER — Other Ambulatory Visit (INDEPENDENT_AMBULATORY_CARE_PROVIDER_SITE_OTHER): Payer: Medicare PPO

## 2020-09-30 ENCOUNTER — Ambulatory Visit: Payer: Medicare PPO | Admitting: Gastroenterology

## 2020-09-30 ENCOUNTER — Encounter: Payer: Self-pay | Admitting: Gastroenterology

## 2020-09-30 VITALS — BP 136/70 | HR 72 | Ht 63.5 in | Wt 153.2 lb

## 2020-09-30 DIAGNOSIS — R634 Abnormal weight loss: Secondary | ICD-10-CM

## 2020-09-30 DIAGNOSIS — R1084 Generalized abdominal pain: Secondary | ICD-10-CM | POA: Diagnosis not present

## 2020-09-30 LAB — BASIC METABOLIC PANEL
BUN: 26 mg/dL — ABNORMAL HIGH (ref 6–23)
CO2: 28 mEq/L (ref 19–32)
Calcium: 9.8 mg/dL (ref 8.4–10.5)
Chloride: 100 mEq/L (ref 96–112)
Creatinine, Ser: 1.36 mg/dL (ref 0.40–1.50)
GFR: 47.81 mL/min — ABNORMAL LOW (ref >60.00)
Glucose, Bld: 90 mg/dL (ref 70–99)
Potassium: 4.5 mEq/L (ref 3.5–5.1)
Sodium: 136 mEq/L (ref 135–145)

## 2020-09-30 MED ORDER — HYOSCYAMINE SULFATE 0.125 MG SL SUBL: 0.1250 mg | SUBLINGUAL_TABLET | Freq: Three times a day (TID) | SUBLINGUAL | 1 refills | Status: DC | PRN

## 2020-09-30 NOTE — Patient Instructions (Signed)
If you are age 85 or older, your body mass index should be between 23-30. Your Body mass index is 26.72 kg/m. If this is out of the aforementioned range listed, please consider follow up with your Primary Care Provider.  Your provider has requested that you go to the basement level for lab work before leaving today. Press "B" on the elevator. The lab is located at the first door on the left as you exit the elevator.  You have been scheduled for a CT scan of the abdomen and pelvis at [facility] ([address]---this is in the same building as [employer]).   You are scheduled on _______ at ______. You should arrive 15 minutes prior to your appointment time for registration. Please follow the written instructions below on the day of your exam:  WARNING: IF YOU ARE ALLERGIC TO IODINE/X-RAY DYE, PLEASE NOTIFY RADIOLOGY IMMEDIATELY AT [phone]! YOU WILL BE GIVEN A 13 HOUR PREMEDICATION PREP.  1) Do not eat or drink anything after ________ (4 hours prior to your test) 2) You have been given 2 bottles of oral contrast to drink. The solution may taste better if refrigerated, but do NOT add ice or any other liquid to this solution. Shake well before drinking.    Drink 1 bottle of contrast @ _______ (2 hours prior to your exam)  Drink 1 bottle of contrast @ _______ (1 hour prior to your exam)  You may take any medications as prescribed with a small amount of water, if necessary. If you take any of the following medications: METFORMIN, GLUCOPHAGE, GLUCOVANCE, AVANDAMET, RIOMET, FORTAMET, [place] MET, JANUMET, GLUMETZA or METAGLIP, you MAY be asked to HOLD this medication 48 hours AFTER the exam.  The purpose of you drinking the oral contrast is to aid in the visualization of your intestinal tract. The contrast solution may cause some diarrhea. Depending on your individual set of symptoms, you may also receive an intravenous injection of x-ray contrast/dye. Plan on being at  [facility] for 30 minutes or longer, depending on the type of exam you are having performed.  This test typically takes 30-45 minutes to complete.  If you have any questions regarding your exam or if you need to reschedule, you may call the CT department at [phone] between the hours of 8:00 am and 5:00 pm, Monday-Friday.  ___________________________________________________________  Due to recent changes in healthcare laws, you may see the results of your imaging and laboratory studies on MyChart before your provider has had a chance to review them.  We understand that in some cases there may be results that are confusing or concerning to you. Not all laboratory results come back in the same time frame and the provider may be waiting for multiple results in order to interpret others.  Please give [place] 48 hours in order for your provider to thoroughly review all the results before contacting the office for clarification of your results.   We have sent the following medications to your pharmacy for you to pick up at your convenience:  START: Levsin 0.156m take one tablet every 8 hours as needed.  Thank you for entrusting me with your care and [facility]  JAlonza Bogus PA

## 2020-09-30 NOTE — Progress Notes (Signed)
09/30/2020 Danny [name] [id number] 1933/12/29   HISTORY OF PRESENT ILLNESS: This is an 87 year old male is a patient Dr. Vena Rua.  He was last seen here in May 2021 for complaints of upper abdominal pain.  At the time he had seen Dr. Hilarie Fredrickson he said his pains had resolved, but Dr. Hilarie Fredrickson stated if it recurred then we would plan for CT scan.  The patient states that he has been having pains intermittently in his upper abdomen.  Sometimes sharp at times.  He would like to get it checked out make sure everything is okay.  He says his wife is in the hospital right now after breaking her hip and will have to go to rehab so he needs to make sure that he is healthy.  He tells me that he was on Librax for years in the past.  Tells me he does not like to take medications unless necessary.  He says he moves his bowels regularly without issues.  Denies any rectal bleeding.  Maybe has occasional very minimal nausea, but no vomiting.  His weight is down 9 pounds since his last visit here in May 2021.   Past Medical History:  Diagnosis Date  . Acid reflux    hiatal hernia  . Diverticulitis   . Erectile dysfunction 04/20/2015  . Essential hypertension 04/20/2015  . History of colon polyps   . Hyperlipidemia 05/28/2015   Past Surgical History:  Procedure Laterality Date  . APPENDECTOMY    . cataract surgery      reports that he has quit smoking. His smoking use included cigars. He has never used smokeless tobacco. He reports that he does not drink alcohol and does not use drugs. family history includes Stroke (age of onset: 62) in his father; Sudden death (age of onset: 59) in his mother. Allergies  Allergen Reactions  . Lipitor [Atorvastatin]     Muscle soreness      Outpatient Encounter Medications as of 09/30/2020  Medication Sig  . hydrochlorothiazide (HYDRODIURIL) 25 MG tablet Take 1 tablet by mouth once daily  . losartan (COZAAR) 100 MG tablet Take 1 tablet by mouth once daily  .  potassium chloride (KLOR-CON) 10 MEQ tablet Take 1 tablet by mouth once daily  . [DISCONTINUED] aspirin 81 MG tablet Take 81 mg by mouth daily.  . [DISCONTINUED] rosuvastatin (CRESTOR) 10 MG tablet Take 1 tablet (10 mg total) by mouth daily.   No facility-administered encounter medications on file as of 09/30/2020.     REVIEW OF SYSTEMS  : All other systems reviewed and negative except where noted in the History of Present Illness.   PHYSICAL EXAM: BP 136/70 (BP Location: Left Arm, Patient Position: Sitting, Cuff Size: Normal)   Pulse 72   Ht 5' 3.5" (1.613 m) Comment: height measured without shoes  Wt 153 lb 4 oz (69.5 kg)   BMI 26.72 kg/m  General: Well developed white male in no acute distress Head: Normocephalic and atraumatic Eyes:  Sclerae anicteric, conjunctiva pink. Ears: Normal auditory acuity Lungs: Clear throughout to auscultation; no W/R/R. Heart: Regular rate and rhythm; no M/R/G. Abdomen: Soft, non-distended.  BS present.  Mild upper abdominal TTP. Musculoskeletal: Symmetrical with no gross deformities  Skin: No lesions on visible extremities Extremities: No edema  Neurological: Alert oriented x 4, grossly non-focal Psychological:  Alert and cooperative. Normal mood and affect  ASSESSMENT AND PLAN: *87 year old male with complaints of upper abdominal pain.  He had similar complaints in May 2021  and Dr. Hilarie Fredrickson suggested CT scan if symptoms continue.  It sounds like he has a history of IBS and had previously taken Librax in the past.  His pains are intermittent, but very sharp at times.  He has lost 9 pounds since his last visit here 10 months ago.  We will plan for CT scan of the abdomen and pelvis with contrast.  We will check a BMP today.  Instead of Librax we can try Levsin as needed.  Prescription sent to pharmacy.   CC:  Hoyt Koch, *

## 2020-10-01 NOTE — Progress Notes (Signed)
Addendum: Reviewed and agree with assessment and management plan. Amish Mintzer M, MD  

## 2020-10-07 ENCOUNTER — Other Ambulatory Visit: Payer: Self-pay

## 2020-10-07 ENCOUNTER — Ambulatory Visit (INDEPENDENT_AMBULATORY_CARE_PROVIDER_SITE_OTHER)
Admission: RE | Admit: 2020-10-07 | Discharge: 2020-10-07 | Disposition: A | Discharge status: Home / Self Care | Payer: Medicare PPO | Source: Ambulatory Visit | Attending: Gastroenterology | Admitting: Gastroenterology

## 2020-10-07 DIAGNOSIS — R634 Abnormal weight loss: Secondary | ICD-10-CM | POA: Diagnosis not present

## 2020-10-07 DIAGNOSIS — R1084 Generalized abdominal pain: Secondary | ICD-10-CM

## 2020-10-07 DIAGNOSIS — R109 Unspecified abdominal pain: Secondary | ICD-10-CM | POA: Diagnosis not present

## 2020-10-07 MED ORDER — IOHEXOL 300 MG/ML  SOLN
80.0000 mL | Freq: Once | INTRAMUSCULAR | Status: AC | PRN
  Administered 2020-10-07: 80 mL via INTRAVENOUS

## 2020-10-24 ENCOUNTER — Encounter: Payer: Self-pay | Admitting: Cardiovascular Disease

## 2020-10-24 NOTE — Progress Notes (Signed)
Cardiology Office Note   Date:  10/25/2020   ID:  Danny [name], DOB 01-17-1934, MRN [id number]  PCP:  Hoyt Koch, MD  Cardiologist:   Mertie Moores, MD   Chief Complaint  Patient presents with  . Hypertension  . Hyperlipidemia   Problem List  1. HTN 2. Hyperlipidemia    Danny [name] is a 87 y.o. male who presents to re-establish care. I saw him many years ago . Has a hx of HTN and hyperlipidema.  Mows yards, does lots of yard work .   September 17, 2015:  BP has been running high. Does not eat salt. Hb has been falling - was 12.8  Sept. 19, 2017:  Staying active.  BP and HR are well controlled . Hx of HTN and hyperlipidemia .  Still anemic.    March 21, 2017: BP is elevated .  Has not had his HCTZ for the past month or so  Still eating salty foods.    March 19, 2018: Patient is doing much better.  He is taking all his medications.  He has been trying to avoid eating so much salt.  September 16, 2018: Danny [name] is seen again for follow up of his HTN and hyperlipidemia. Takes his meds regularly .  In Dec. 2019, he was sitting on the commode and had a presyncopal / syncope episode  The episode was associated with an episode of diarrhea .  Does not sound cardiac  Has been exercising regularly since that tmie  Labs from primary MD Chol = 156 HDL = 60 LDL = 82 Trigs = 82   September 16, 2019 Danny [name] is seen for follow up of his syncope / vasovagal syncope  No cp or dyspnea.  No recent syncope . Stays active.    Working out daily ,  Exercises. , is currently paitnting the living room .  VS looks  October 25, 2020  Danny [name] is seen for follow up of his syncope / vasovagle syncope . Stressed out currently . Wife fell and broke her hip.   Spent some time at [employer] , is not at home  Has not been measuring his BP at home   Has mild AS    Past Medical History:  Diagnosis Date  . Acid reflux    hiatal hernia  . Diverticulitis   . Erectile  dysfunction 04/20/2015  . Essential hypertension 04/20/2015  . History of colon polyps   . Hyperlipidemia 05/28/2015    Past Surgical History:  Procedure Laterality Date  . APPENDECTOMY    . cataract surgery       Current Outpatient Medications  Medication Sig Dispense Refill  . hydrochlorothiazide (HYDRODIURIL) 25 MG tablet Take 1 tablet by mouth once daily 90 tablet 0  . hyoscyamine (LEVSIN SL) 0.125 MG SL tablet Place 1 tablet (0.125 mg total) under the tongue every 8 (eight) hours as needed for cramping. 30 tablet 1  . losartan (COZAAR) 100 MG tablet Take 1 tablet by mouth once daily 90 tablet 0  . potassium chloride (KLOR-CON) 10 MEQ tablet Take 1 tablet by mouth once daily 90 tablet 0   No current facility-administered medications for this visit.    Allergies:   Lipitor [atorvastatin]    Social History:  The patient  reports that he has quit smoking. His smoking use included cigars. He has never used smokeless tobacco. He reports that he does not drink alcohol and does not use drugs.   Family History:  The  patient's family history includes Stroke (age of onset: 51) in his father; Sudden death (age of onset: 1) in his mother.    ROS:  Please see the history of present illness.    Physical Exam: Blood pressure 120/80, pulse (!) 59, height 5\' 4"  (1.626 m), weight 155 lb 9.6 oz (70.6 kg), SpO2 98 %.  GEN:  Well nourished, well developed in no acute distress HEENT: Normal NECK: No JVD; No carotid bruits LYMPHATICS: No lymphadenopathy CARDIAC: RRR,  2/6 systolic murmur  RESPIRATORY:  Clear to auscultation without rales, wheezing or rhonchi  ABDOMEN: Soft, non-tender, non-distended MUSCULOSKELETAL:  No edema; No deformity  SKIN: Warm and dry NEUROLOGIC:  Alert and oriented x 3   EKG:    October 26, 2018: Sinus bradycardia at 59.  He has frequent premature atrial contractions.  No ST or T wave changes.   Recent Labs: 09/30/2020: BUN 26; Creatinine, Ser 1.36; Potassium  4.5; Sodium 136    Lipid Panel    Component Value Date/Time   CHOL 250 (H) 09/16/2019 1614   TRIG 166 (H) 09/16/2019 1614   HDL 48 09/16/2019 1614   CHOLHDL 5.2 (H) 09/16/2019 1614   CHOLHDL 3.6 12/20/2015 1008   VLDL 23 12/20/2015 1008   LDLCALC 171 (H) 09/16/2019 1614      Wt Readings from Last 3 Encounters:  10/25/20 155 lb 9.6 oz (70.6 kg)  09/30/20 153 lb 4 oz (69.5 kg)  11/20/19 161 lb 3.2 oz (73.1 kg)      Other studies Reviewed: Additional studies/ records that were reviewed today include: . Review of the above records demonstrates:    ASSESSMENT AND PLAN:  1.  Syncope:    No further episodes of syncope   2.  Essential Hypertension:        Bp is well controlled.   Cont current meds.  BMP today     3.   Hyperlipidemia:        Check lipids, ALT, BMP today   He developed muscle soreness with atorvastatin     Current medicines are reviewed at length with the patient today.  The patient does not have concerns regarding medicines.  The following changes have been made:  no change  Labs/ tests ordered today include:   Orders Placed This Encounter  Procedures  . Basic metabolic panel  . ALT  . Lipid panel   Disposition:   I will see him again in 1 year.   Mertie Moores, MD  10/25/2020 10:57 AM    [address], [place], [place]  [address] Phone: [phone]; Fax: [phone]

## 2020-10-25 ENCOUNTER — Ambulatory Visit: Payer: Medicare PPO | Admitting: Cardiovascular Disease

## 2020-10-25 ENCOUNTER — Other Ambulatory Visit: Payer: Self-pay

## 2020-10-25 ENCOUNTER — Encounter: Payer: Self-pay | Admitting: Cardiovascular Disease

## 2020-10-25 VITALS — BP 120/80 | HR 59 | Ht 64.0 in | Wt 155.6 lb

## 2020-10-25 DIAGNOSIS — I10 Essential (primary) hypertension: Secondary | ICD-10-CM

## 2020-10-25 LAB — LIPID PANEL
Chol/HDL Ratio: 4.2 ratio (ref 0.0–5.0)
Cholesterol, Total: 245 mg/dL — ABNORMAL HIGH (ref 100–199)
HDL: 58 mg/dL (ref >39)
LDL Chol Calc (NIH): 170 mg/dL — ABNORMAL HIGH (ref 0–99)
Triglycerides: 95 mg/dL (ref 0–149)
VLDL Cholesterol Cal: 17 mg/dL (ref 5–40)

## 2020-10-25 LAB — BASIC METABOLIC PANEL
BUN/Creatinine Ratio: 19 (ref 10–24)
BUN: 23 mg/dL (ref 8–27)
CO2: 24 mmol/L (ref 20–29)
Calcium: 9.7 mg/dL (ref 8.6–10.2)
Chloride: 101 mmol/L (ref 96–106)
Creatinine, Ser: 1.18 mg/dL (ref 0.76–1.27)
Glucose: 90 mg/dL (ref 65–99)
Potassium: 4.5 mmol/L (ref 3.5–5.2)
Sodium: 138 mmol/L (ref 134–144)
eGFR: 61 mL/min/{1.73_m2} (ref >59)

## 2020-10-25 LAB — ALT: ALT: 11 IU/L (ref 0–44)

## 2020-10-25 NOTE — Addendum Note (Signed)
Addended by: Carylon Perches on: 10/25/2020 02:37 PM   Modules accepted: Orders

## 2020-10-25 NOTE — Patient Instructions (Addendum)
Medication Instructions:  Your physician recommends that you continue on your current medications as directed. Please refer to the Current Medication list given to you today.  *If you need a refill on your cardiac medications before your next appointment, please call your pharmacy*   Lab Work: TODAY:BMET,Lipids, ALT If you have labs (blood work) drawn today and your tests are completely normal, you will receive your results only by: Marland Kitchen MyChart Message (if you have MyChart) OR . A paper copy in the mail If you have any lab test that is abnormal or we need to change your treatment, we will call you to review the results.   Testing/Procedures: none   Follow-Up: At [facility], you and your health needs are our priority.  As part of our continuing mission to provide you with exceptional heart care, we have created designated Provider Care Teams.  These Care Teams include your primary Cardiologist (physician) and Advanced Practice Providers (APPs -  Physician Assistants and Nurse Practitioners) who all work together to provide you with the care you need, when you need it.  We recommend signing up for the patient portal called "MyChart".  Sign up information is provided on this After Visit Summary.  MyChart is used to connect with patients for Virtual Visits (Telemedicine).  Patients are able to view lab/test results, encounter notes, upcoming appointments, etc.  Non-urgent messages can be sent to your provider as well.   To learn more about what you can do with MyChart, go to [email or web address].    Your next appointment:   1 year(s)  The format for your next appointment:   In Person  Provider:   You may see Mertie Moores, MD or one of the following Advanced Practice Providers on your designated Care Team:    [name], PA-C  [place], [place]

## 2020-10-27 ENCOUNTER — Telehealth: Payer: Self-pay

## 2020-10-27 DIAGNOSIS — E782 Mixed hyperlipidemia: Secondary | ICD-10-CM

## 2020-10-27 MED ORDER — EZETIMIBE 10 MG PO TABS: 10.0000 mg | ORAL_TABLET | Freq: Every day | ORAL | 3 refills | Status: DC

## 2020-10-27 NOTE — Telephone Encounter (Signed)
-----   Message from Thayer Headings, MD sent at 10/26/2020  3:14 PM EDT ----- He is intolerant to statins ( lipitor) Lets start Zetia 10 mg a day  Check lipids, ALT in 3 months

## 2020-10-27 NOTE — Telephone Encounter (Signed)
Orders entered for new start of Zetia. Lab appt scheduled for 7/20.

## 2020-12-29 ENCOUNTER — Other Ambulatory Visit: Payer: Self-pay | Admitting: Cardiovascular Disease

## 2021-01-26 ENCOUNTER — Other Ambulatory Visit: Payer: Medicare PPO

## 2021-01-26 ENCOUNTER — Other Ambulatory Visit: Payer: Self-pay

## 2021-01-26 ENCOUNTER — Encounter (INDEPENDENT_AMBULATORY_CARE_PROVIDER_SITE_OTHER): Payer: Self-pay

## 2021-01-26 DIAGNOSIS — E782 Mixed hyperlipidemia: Secondary | ICD-10-CM

## 2021-01-26 LAB — LIPID PANEL
Chol/HDL Ratio: 4 ratio (ref 0.0–5.0)
Cholesterol, Total: 219 mg/dL — ABNORMAL HIGH (ref 100–199)
HDL: 55 mg/dL (ref >39)
LDL Chol Calc (NIH): 150 mg/dL — ABNORMAL HIGH (ref 0–99)
Triglycerides: 78 mg/dL (ref 0–149)
VLDL Cholesterol Cal: 14 mg/dL (ref 5–40)

## 2021-01-26 LAB — ALT: ALT: 10 IU/L (ref 0–44)

## 2021-01-27 DIAGNOSIS — Z85828 Personal history of other malignant neoplasm of skin: Secondary | ICD-10-CM | POA: Diagnosis not present

## 2021-01-27 DIAGNOSIS — B353 Tinea pedis: Secondary | ICD-10-CM | POA: Diagnosis not present

## 2021-01-27 DIAGNOSIS — L570 Actinic keratosis: Secondary | ICD-10-CM | POA: Diagnosis not present

## 2021-01-27 DIAGNOSIS — L821 Other seborrheic keratosis: Secondary | ICD-10-CM | POA: Diagnosis not present

## 2021-01-27 DIAGNOSIS — L814 Other melanin hyperpigmentation: Secondary | ICD-10-CM | POA: Diagnosis not present

## 2021-01-27 DIAGNOSIS — D1801 Hemangioma of skin and subcutaneous tissue: Secondary | ICD-10-CM | POA: Diagnosis not present

## 2021-01-27 DIAGNOSIS — D044 Carcinoma in situ of skin of scalp and neck: Secondary | ICD-10-CM | POA: Diagnosis not present

## 2021-02-28 DIAGNOSIS — D044 Carcinoma in situ of skin of scalp and neck: Secondary | ICD-10-CM | POA: Diagnosis not present

## 2021-02-28 DIAGNOSIS — Z85828 Personal history of other malignant neoplasm of skin: Secondary | ICD-10-CM | POA: Diagnosis not present

## 2021-05-03 DIAGNOSIS — H524 Presbyopia: Secondary | ICD-10-CM | POA: Diagnosis not present

## 2021-05-03 DIAGNOSIS — Z961 Presence of intraocular lens: Secondary | ICD-10-CM | POA: Diagnosis not present

## 2021-07-26 DIAGNOSIS — D692 Other nonthrombocytopenic purpura: Secondary | ICD-10-CM | POA: Diagnosis not present

## 2021-07-26 DIAGNOSIS — L821 Other seborrheic keratosis: Secondary | ICD-10-CM | POA: Diagnosis not present

## 2021-07-26 DIAGNOSIS — L814 Other melanin hyperpigmentation: Secondary | ICD-10-CM | POA: Diagnosis not present

## 2021-07-26 DIAGNOSIS — R4020 Unspecified coma: Secondary | ICD-10-CM | POA: Diagnosis not present

## 2021-07-26 DIAGNOSIS — R569 Unspecified convulsions: Secondary | ICD-10-CM | POA: Diagnosis not present

## 2021-07-26 DIAGNOSIS — C44629 Squamous cell carcinoma of skin of left upper limb, including shoulder: Secondary | ICD-10-CM | POA: Diagnosis not present

## 2021-07-26 DIAGNOSIS — L570 Actinic keratosis: Secondary | ICD-10-CM | POA: Diagnosis not present

## 2021-07-26 DIAGNOSIS — I213 ST elevation (STEMI) myocardial infarction of unspecified site: Secondary | ICD-10-CM | POA: Diagnosis not present

## 2021-07-26 DIAGNOSIS — R410 Disorientation, unspecified: Secondary | ICD-10-CM | POA: Diagnosis not present

## 2021-07-26 DIAGNOSIS — D485 Neoplasm of uncertain behavior of skin: Secondary | ICD-10-CM | POA: Diagnosis not present

## 2021-07-26 DIAGNOSIS — Z85828 Personal history of other malignant neoplasm of skin: Secondary | ICD-10-CM | POA: Diagnosis not present

## 2021-07-26 DIAGNOSIS — R404 Transient alteration of awareness: Secondary | ICD-10-CM | POA: Diagnosis not present

## 2021-07-27 ENCOUNTER — Encounter (HOSPITAL_COMMUNITY)
Admission: EM | Disposition: A | Discharge status: Home Health | Payer: Self-pay | Source: Home / Self Care | Attending: Surgery

## 2021-07-27 ENCOUNTER — Inpatient Hospital Stay (HOSPITAL_COMMUNITY)
Admission: EM | Admit: 2021-07-27 | Discharge: 2021-08-10 | DRG: 234 | Disposition: A | Discharge status: Home Health | Payer: Medicare PPO | Attending: Surgery | Admitting: Surgery

## 2021-07-27 ENCOUNTER — Other Ambulatory Visit: Payer: Self-pay

## 2021-07-27 ENCOUNTER — Emergency Department (HOSPITAL_COMMUNITY): Payer: Medicare PPO

## 2021-07-27 ENCOUNTER — Encounter (HOSPITAL_COMMUNITY): Payer: Self-pay | Admitting: *Deleted

## 2021-07-27 DIAGNOSIS — K219 Gastro-esophageal reflux disease without esophagitis: Secondary | ICD-10-CM | POA: Diagnosis present

## 2021-07-27 DIAGNOSIS — I2510 Atherosclerotic heart disease of native coronary artery without angina pectoris: Secondary | ICD-10-CM | POA: Diagnosis not present

## 2021-07-27 DIAGNOSIS — I350 Nonrheumatic aortic (valve) stenosis: Secondary | ICD-10-CM | POA: Diagnosis not present

## 2021-07-27 DIAGNOSIS — J9811 Atelectasis: Secondary | ICD-10-CM | POA: Diagnosis not present

## 2021-07-27 DIAGNOSIS — Z09 Encounter for follow-up examination after completed treatment for conditions other than malignant neoplasm: Secondary | ICD-10-CM

## 2021-07-27 DIAGNOSIS — I249 Acute ischemic heart disease, unspecified: Secondary | ICD-10-CM | POA: Diagnosis not present

## 2021-07-27 DIAGNOSIS — S90822A Blister (nonthermal), left foot, initial encounter: Secondary | ICD-10-CM | POA: Diagnosis present

## 2021-07-27 DIAGNOSIS — Z823 Family history of stroke: Secondary | ICD-10-CM | POA: Diagnosis not present

## 2021-07-27 DIAGNOSIS — N1831 Chronic kidney disease, stage 3a: Secondary | ICD-10-CM | POA: Diagnosis present

## 2021-07-27 DIAGNOSIS — I10 Essential (primary) hypertension: Secondary | ICD-10-CM | POA: Diagnosis not present

## 2021-07-27 DIAGNOSIS — E538 Deficiency of other specified B group vitamins: Secondary | ICD-10-CM | POA: Diagnosis present

## 2021-07-27 DIAGNOSIS — E162 Hypoglycemia, unspecified: Secondary | ICD-10-CM | POA: Diagnosis not present

## 2021-07-27 DIAGNOSIS — I252 Old myocardial infarction: Secondary | ICD-10-CM | POA: Diagnosis not present

## 2021-07-27 DIAGNOSIS — D539 Nutritional anemia, unspecified: Secondary | ICD-10-CM | POA: Diagnosis present

## 2021-07-27 DIAGNOSIS — N183 Chronic kidney disease, stage 3 unspecified: Secondary | ICD-10-CM | POA: Diagnosis present

## 2021-07-27 DIAGNOSIS — D62 Acute posthemorrhagic anemia: Secondary | ICD-10-CM | POA: Diagnosis not present

## 2021-07-27 DIAGNOSIS — E785 Hyperlipidemia, unspecified: Secondary | ICD-10-CM | POA: Diagnosis present

## 2021-07-27 DIAGNOSIS — I2119 ST elevation (STEMI) myocardial infarction involving other coronary artery of inferior wall: Secondary | ICD-10-CM

## 2021-07-27 DIAGNOSIS — Z888 Allergy status to other drugs, medicaments and biological substances status: Secondary | ICD-10-CM

## 2021-07-27 DIAGNOSIS — R0602 Shortness of breath: Secondary | ICD-10-CM | POA: Diagnosis not present

## 2021-07-27 DIAGNOSIS — Z8719 Personal history of other diseases of the digestive system: Secondary | ICD-10-CM

## 2021-07-27 DIAGNOSIS — J939 Pneumothorax, unspecified: Secondary | ICD-10-CM | POA: Diagnosis not present

## 2021-07-27 DIAGNOSIS — R4182 Altered mental status, unspecified: Secondary | ICD-10-CM | POA: Diagnosis not present

## 2021-07-27 DIAGNOSIS — K30 Functional dyspepsia: Secondary | ICD-10-CM | POA: Diagnosis present

## 2021-07-27 DIAGNOSIS — R079 Chest pain, unspecified: Secondary | ICD-10-CM | POA: Diagnosis not present

## 2021-07-27 DIAGNOSIS — Z951 Presence of aortocoronary bypass graft: Secondary | ICD-10-CM

## 2021-07-27 DIAGNOSIS — I6501 Occlusion and stenosis of right vertebral artery: Secondary | ICD-10-CM | POA: Diagnosis not present

## 2021-07-27 DIAGNOSIS — D509 Iron deficiency anemia, unspecified: Secondary | ICD-10-CM | POA: Diagnosis present

## 2021-07-27 DIAGNOSIS — Z20822 Contact with and (suspected) exposure to covid-19: Secondary | ICD-10-CM | POA: Diagnosis present

## 2021-07-27 DIAGNOSIS — R569 Unspecified convulsions: Secondary | ICD-10-CM | POA: Diagnosis not present

## 2021-07-27 DIAGNOSIS — I214 Non-ST elevation (NSTEMI) myocardial infarction: Principal | ICD-10-CM | POA: Diagnosis present

## 2021-07-27 DIAGNOSIS — E871 Hypo-osmolality and hyponatremia: Secondary | ICD-10-CM | POA: Diagnosis not present

## 2021-07-27 DIAGNOSIS — Z87891 Personal history of nicotine dependence: Secondary | ICD-10-CM | POA: Diagnosis not present

## 2021-07-27 DIAGNOSIS — D531 Other megaloblastic anemias, not elsewhere classified: Secondary | ICD-10-CM | POA: Diagnosis not present

## 2021-07-27 DIAGNOSIS — I352 Nonrheumatic aortic (valve) stenosis with insufficiency: Secondary | ICD-10-CM | POA: Diagnosis not present

## 2021-07-27 DIAGNOSIS — I083 Combined rheumatic disorders of mitral, aortic and tricuspid valves: Secondary | ICD-10-CM | POA: Diagnosis present

## 2021-07-27 DIAGNOSIS — Z79899 Other long term (current) drug therapy: Secondary | ICD-10-CM

## 2021-07-27 DIAGNOSIS — Z7982 Long term (current) use of aspirin: Secondary | ICD-10-CM

## 2021-07-27 DIAGNOSIS — M7989 Other specified soft tissue disorders: Secondary | ICD-10-CM | POA: Diagnosis not present

## 2021-07-27 DIAGNOSIS — I213 ST elevation (STEMI) myocardial infarction of unspecified site: Secondary | ICD-10-CM | POA: Insufficient documentation

## 2021-07-27 DIAGNOSIS — I309 Acute pericarditis, unspecified: Secondary | ICD-10-CM | POA: Diagnosis not present

## 2021-07-27 DIAGNOSIS — I959 Hypotension, unspecified: Secondary | ICD-10-CM | POA: Diagnosis present

## 2021-07-27 DIAGNOSIS — I517 Cardiomegaly: Secondary | ICD-10-CM | POA: Diagnosis not present

## 2021-07-27 DIAGNOSIS — Z01810 Encounter for preprocedural cardiovascular examination: Secondary | ICD-10-CM | POA: Diagnosis not present

## 2021-07-27 DIAGNOSIS — I6523 Occlusion and stenosis of bilateral carotid arteries: Secondary | ICD-10-CM | POA: Diagnosis not present

## 2021-07-27 DIAGNOSIS — J90 Pleural effusion, not elsewhere classified: Secondary | ICD-10-CM | POA: Diagnosis not present

## 2021-07-27 DIAGNOSIS — I129 Hypertensive chronic kidney disease with stage 1 through stage 4 chronic kidney disease, or unspecified chronic kidney disease: Secondary | ICD-10-CM | POA: Diagnosis present

## 2021-07-27 DIAGNOSIS — I454 Nonspecific intraventricular block: Secondary | ICD-10-CM | POA: Diagnosis present

## 2021-07-27 DIAGNOSIS — Z8673 Personal history of transient ischemic attack (TIA), and cerebral infarction without residual deficits: Secondary | ICD-10-CM | POA: Diagnosis not present

## 2021-07-27 DIAGNOSIS — D469 Myelodysplastic syndrome, unspecified: Secondary | ICD-10-CM | POA: Clinically undetermined

## 2021-07-27 DIAGNOSIS — D696 Thrombocytopenia, unspecified: Secondary | ICD-10-CM | POA: Clinically undetermined

## 2021-07-27 DIAGNOSIS — R451 Restlessness and agitation: Secondary | ICD-10-CM | POA: Diagnosis present

## 2021-07-27 DIAGNOSIS — I6622 Occlusion and stenosis of left posterior cerebral artery: Secondary | ICD-10-CM | POA: Diagnosis not present

## 2021-07-27 HISTORY — PX: LEFT HEART CATH AND CORONARY ANGIOGRAPHY: CATH118249

## 2021-07-27 LAB — COMPREHENSIVE METABOLIC PANEL
ALT: 17 U/L (ref 0–44)
AST: 22 U/L (ref 15–41)
Albumin: 3.2 g/dL — ABNORMAL LOW (ref 3.5–5.0)
Alkaline Phosphatase: 50 U/L (ref 38–126)
Anion gap: 9 (ref 5–15)
BUN: 28 mg/dL — ABNORMAL HIGH (ref 8–23)
CO2: 19 mmol/L — ABNORMAL LOW (ref 22–32)
Calcium: 8 mg/dL — ABNORMAL LOW (ref 8.9–10.3)
Chloride: 107 mmol/L (ref 98–111)
Creatinine, Ser: 1.29 mg/dL — ABNORMAL HIGH (ref 0.61–1.24)
GFR, Estimated: 54 mL/min — ABNORMAL LOW (ref >60)
Glucose, Bld: 117 mg/dL — ABNORMAL HIGH (ref 70–99)
Potassium: 3.8 mmol/L (ref 3.5–5.1)
Sodium: 135 mmol/L (ref 135–145)
Total Bilirubin: 0.3 mg/dL (ref 0.3–1.2)
Total Protein: 5.6 g/dL — ABNORMAL LOW (ref 6.5–8.1)

## 2021-07-27 LAB — TSH: TSH: 1.029 u[IU]/mL (ref 0.350–4.500)

## 2021-07-27 LAB — I-STAT CHEM 8, ED
BUN: 26 mg/dL — ABNORMAL HIGH (ref 8–23)
Calcium, Ion: 1.08 mmol/L — ABNORMAL LOW (ref 1.15–1.40)
Chloride: 105 mmol/L (ref 98–111)
Creatinine, Ser: 1.2 mg/dL (ref 0.61–1.24)
Glucose, Bld: 114 mg/dL — ABNORMAL HIGH (ref 70–99)
HCT: 29 % — ABNORMAL LOW (ref 39.0–52.0)
Hemoglobin: 9.9 g/dL — ABNORMAL LOW (ref 13.0–17.0)
Potassium: 3.8 mmol/L (ref 3.5–5.1)
Sodium: 138 mmol/L (ref 135–145)
TCO2: 20 mmol/L — ABNORMAL LOW (ref 22–32)

## 2021-07-27 LAB — IRON AND TIBC
Iron: 109 ug/dL (ref 45–182)
Saturation Ratios: 38 % (ref 17.9–39.5)
TIBC: 287 ug/dL (ref 250–450)
UIBC: 178 ug/dL

## 2021-07-27 LAB — URINALYSIS, ROUTINE W REFLEX MICROSCOPIC
Bilirubin Urine: NEGATIVE
Glucose, UA: NEGATIVE mg/dL
Ketones, ur: NEGATIVE mg/dL
Leukocytes,Ua: NEGATIVE
Nitrite: NEGATIVE
Protein, ur: NEGATIVE mg/dL
Specific Gravity, Urine: 1.01 (ref 1.005–1.030)
pH: 6.5 (ref 5.0–8.0)

## 2021-07-27 LAB — CBC WITH DIFFERENTIAL/PLATELET
Abs Immature Granulocytes: 0.03 10*3/uL (ref 0.00–0.07)
Basophils Absolute: 0 10*3/uL (ref 0.0–0.1)
Basophils Relative: 0 %
Eosinophils Absolute: 0.2 10*3/uL (ref 0.0–0.5)
Eosinophils Relative: 3 %
HCT: 28.9 % — ABNORMAL LOW (ref 39.0–52.0)
Hemoglobin: 9.8 g/dL — ABNORMAL LOW (ref 13.0–17.0)
Immature Granulocytes: 1 %
Lymphocytes Relative: 31 %
Lymphs Abs: 1.7 10*3/uL (ref 0.7–4.0)
MCH: 34.1 pg — ABNORMAL HIGH (ref 26.0–34.0)
MCHC: 33.9 g/dL (ref 30.0–36.0)
MCV: 100.7 fL — ABNORMAL HIGH (ref 80.0–100.0)
Monocytes Absolute: 0.6 10*3/uL (ref 0.1–1.0)
Monocytes Relative: 10 %
Neutro Abs: 3 10*3/uL (ref 1.7–7.7)
Neutrophils Relative %: 55 %
Platelets: 144 10*3/uL — ABNORMAL LOW (ref 150–400)
RBC: 2.87 MIL/uL — ABNORMAL LOW (ref 4.22–5.81)
RDW: 13.6 % (ref 11.5–15.5)
WBC: 5.4 10*3/uL (ref 4.0–10.5)
nRBC: 0 % (ref 0.0–0.2)

## 2021-07-27 LAB — URINALYSIS, MICROSCOPIC (REFLEX): Squamous Epithelial / HPF: NONE SEEN (ref 0–5)

## 2021-07-27 LAB — LIPID PANEL
Cholesterol: 174 mg/dL (ref 0–200)
HDL: 58 mg/dL (ref >40)
LDL Cholesterol: 105 mg/dL — ABNORMAL HIGH (ref 0–99)
Total CHOL/HDL Ratio: 3 RATIO
Triglycerides: 57 mg/dL (ref <150)
VLDL: 11 mg/dL (ref 0–40)

## 2021-07-27 LAB — CARDIAC CATHETERIZATION: Cath EF Quantitative: 40 %

## 2021-07-27 LAB — VITAMIN B12: Vitamin B-12: 201 pg/mL (ref 180–914)

## 2021-07-27 LAB — PROTIME-INR
INR: 1 (ref 0.8–1.2)
Prothrombin Time: 13.5 seconds (ref 11.4–15.2)

## 2021-07-27 LAB — RETICULOCYTES
Immature Retic Fract: 14.7 % (ref 2.3–15.9)
RBC.: 2.73 MIL/uL — ABNORMAL LOW (ref 4.22–5.81)
Retic Count, Absolute: 36 10*3/uL (ref 19.0–186.0)
Retic Ct Pct: 1.3 % (ref 0.4–3.1)

## 2021-07-27 LAB — HEMOGLOBIN A1C
Hgb A1c MFr Bld: 5.5 % (ref 4.8–5.6)
Mean Plasma Glucose: 111.15 mg/dL

## 2021-07-27 LAB — FOLATE: Folate: 16.9 ng/mL (ref >5.9)

## 2021-07-27 LAB — FERRITIN: Ferritin: 135 ng/mL (ref 24–336)

## 2021-07-27 LAB — BRAIN NATRIURETIC PEPTIDE: B Natriuretic Peptide: 51.3 pg/mL (ref 0.0–100.0)

## 2021-07-27 LAB — POC OCCULT BLOOD, ED: Fecal Occult Bld: NEGATIVE

## 2021-07-27 LAB — MAGNESIUM: Magnesium: 1.6 mg/dL — ABNORMAL LOW (ref 1.7–2.4)

## 2021-07-27 LAB — APTT: aPTT: 26 seconds (ref 24–36)

## 2021-07-27 LAB — TROPONIN I (HIGH SENSITIVITY)
Troponin I (High Sensitivity): 9 ng/L (ref <18)
Troponin I (High Sensitivity): 61 ng/L — ABNORMAL HIGH (ref <18)
Troponin I (High Sensitivity): 993 ng/L (ref <18)
Troponin I (High Sensitivity): 5799 ng/L (ref <18)

## 2021-07-27 LAB — RESP PANEL BY RT-PCR (FLU A&B, COVID) ARPGX2
Influenza A by PCR: NEGATIVE
Influenza B by PCR: NEGATIVE
SARS Coronavirus 2 by RT PCR: NEGATIVE

## 2021-07-27 SURGERY — LEFT HEART CATH AND CORONARY ANGIOGRAPHY
Anesthesia: LOCAL

## 2021-07-27 MED ORDER — SODIUM CHLORIDE 0.9 % IV SOLN: 250.0000 mL | INTRAVENOUS | Status: DC | PRN

## 2021-07-27 MED ORDER — VERAPAMIL HCL 2.5 MG/ML IV SOLN: INTRAVENOUS | Status: DC | PRN

## 2021-07-27 MED ORDER — ASPIRIN EC 81 MG PO TBEC
81.0000 mg | DELAYED_RELEASE_TABLET | Freq: Every day | ORAL | Status: DC
  Administered 2021-07-28 – 2021-07-31 (×4): 81 mg via ORAL
  Filled 2021-07-27 (×4): qty 1

## 2021-07-27 MED ORDER — SODIUM CHLORIDE 0.9 % IV SOLN: INTRAVENOUS | Status: DC

## 2021-07-27 MED ORDER — ASPIRIN 81 MG PO CHEW: 324.0000 mg | CHEWABLE_TABLET | ORAL | Status: DC

## 2021-07-27 MED ORDER — MAGNESIUM SULFATE 2 GM/50ML IV SOLN
2.0000 g | Freq: Once | INTRAVENOUS | Status: AC
  Administered 2021-07-27: 2 g via INTRAVENOUS
  Filled 2021-07-27: qty 50

## 2021-07-27 MED ORDER — SODIUM CHLORIDE 0.9% FLUSH: 3.0000 mL | Freq: Two times a day (BID) | INTRAVENOUS | Status: DC

## 2021-07-27 MED ORDER — IOHEXOL 350 MG/ML SOLN
75.0000 mL | Freq: Once | INTRAVENOUS | Status: AC | PRN
  Administered 2021-07-27: 75 mL via INTRAVENOUS

## 2021-07-27 MED ORDER — ROSUVASTATIN CALCIUM 20 MG PO TABS
20.0000 mg | ORAL_TABLET | Freq: Every day | ORAL | Status: DC
  Administered 2021-07-27 – 2021-08-10 (×14): 20 mg via ORAL
  Filled 2021-07-27 (×14): qty 1

## 2021-07-27 MED ORDER — ONDANSETRON HCL 4 MG/2ML IJ SOLN: 4.0000 mg | Freq: Four times a day (QID) | INTRAMUSCULAR | Status: DC | PRN

## 2021-07-27 MED ORDER — LIDOCAINE HCL (PF) 1 % IJ SOLN
INTRAMUSCULAR | Status: AC
  Filled 2021-07-27: qty 30

## 2021-07-27 MED ORDER — NITROGLYCERIN 1 MG/10 ML FOR IR/CATH LAB
INTRA_ARTERIAL | Status: AC
  Filled 2021-07-27: qty 10

## 2021-07-27 MED ORDER — SODIUM CHLORIDE 0.9 % IV SOLN: INTRAVENOUS | Status: AC

## 2021-07-27 MED ORDER — LACTATED RINGERS IV BOLUS
1000.0000 mL | Freq: Once | INTRAVENOUS | Status: AC
  Administered 2021-07-27: 1000 mL via INTRAVENOUS

## 2021-07-27 MED ORDER — SODIUM CHLORIDE 0.9 % IV BOLUS (SEPSIS)
1000.0000 mL | Freq: Once | INTRAVENOUS | Status: AC
  Administered 2021-07-27: 1000 mL via INTRAVENOUS

## 2021-07-27 MED ORDER — SODIUM CHLORIDE 0.9 % IV SOLN: 1000.0000 mL | INTRAVENOUS | Status: DC

## 2021-07-27 MED ORDER — ACETAMINOPHEN 325 MG PO TABS: 650.0000 mg | ORAL_TABLET | ORAL | Status: DC | PRN

## 2021-07-27 MED ORDER — HEPARIN BOLUS VIA INFUSION
3500.0000 [IU] | Freq: Once | INTRAVENOUS | Status: AC
Start: 2021-07-27 — End: 2021-07-27
  Administered 2021-07-27: 3500 [IU] via INTRAVENOUS
  Filled 2021-07-27: qty 3500

## 2021-07-27 MED ORDER — NITROGLYCERIN 0.4 MG SL SUBL: 0.4000 mg | SUBLINGUAL_TABLET | SUBLINGUAL | Status: DC | PRN

## 2021-07-27 MED ORDER — ASPIRIN 300 MG RE SUPP: 300.0000 mg | RECTAL | Status: DC

## 2021-07-27 MED ORDER — HEPARIN (PORCINE) 25000 UT/250ML-% IV SOLN
800.0000 [IU]/h | INTRAVENOUS | Status: DC
  Administered 2021-07-27: 800 [IU]/h via INTRAVENOUS
  Filled 2021-07-27: qty 250

## 2021-07-27 MED ORDER — HEPARIN SODIUM (PORCINE) 1000 UNIT/ML IJ SOLN
INTRAMUSCULAR | Status: AC
  Filled 2021-07-27: qty 10

## 2021-07-27 MED ORDER — HEPARIN SODIUM (PORCINE) 1000 UNIT/ML IJ SOLN
INTRAMUSCULAR | Status: DC | PRN
  Administered 2021-07-27: 4000 [IU] via INTRAVENOUS

## 2021-07-27 MED ORDER — HEPARIN (PORCINE) IN NACL 1000-0.9 UT/500ML-% IV SOLN
INTRAVENOUS | Status: AC
  Filled 2021-07-27: qty 1000

## 2021-07-27 MED ORDER — ASPIRIN 81 MG PO CHEW
324.0000 mg | CHEWABLE_TABLET | Freq: Once | ORAL | Status: AC
  Administered 2021-07-27: 324 mg via ORAL
  Filled 2021-07-27: qty 4

## 2021-07-27 MED ORDER — SODIUM CHLORIDE 0.9% FLUSH: 3.0000 mL | INTRAVENOUS | Status: DC | PRN

## 2021-07-27 MED ORDER — LIDOCAINE HCL (PF) 1 % IJ SOLN
INTRAMUSCULAR | Status: DC | PRN
  Administered 2021-07-27: 2 mL

## 2021-07-27 MED ORDER — IOHEXOL 350 MG/ML SOLN
INTRAVENOUS | Status: DC | PRN
  Administered 2021-07-27: 60 mL

## 2021-07-27 MED ORDER — HEPARIN (PORCINE) IN NACL 1000-0.9 UT/500ML-% IV SOLN
INTRAVENOUS | Status: DC | PRN
  Administered 2021-07-27 (×2): 500 mL

## 2021-07-27 MED ORDER — VERAPAMIL HCL 2.5 MG/ML IV SOLN
INTRAVENOUS | Status: AC
  Filled 2021-07-27: qty 2

## 2021-07-27 MED ORDER — SODIUM CHLORIDE 0.9% FLUSH
3.0000 mL | Freq: Two times a day (BID) | INTRAVENOUS | Status: DC
  Administered 2021-07-27 – 2021-07-31 (×9): 3 mL via INTRAVENOUS

## 2021-07-27 MED ORDER — HEPARIN (PORCINE) 25000 UT/250ML-% IV SOLN
1150.0000 [IU]/h | INTRAVENOUS | Status: DC
  Administered 2021-07-27: 20:00:00 800 [IU]/h via INTRAVENOUS
  Administered 2021-07-29: 850 [IU]/h via INTRAVENOUS
  Administered 2021-07-30: 950 [IU]/h via INTRAVENOUS
  Administered 2021-07-31: 1150 [IU]/h via INTRAVENOUS
  Filled 2021-07-27 (×4): qty 250

## 2021-07-27 SURGICAL SUPPLY — 12 items
CATH INFINITI 5 FR JL3.5 (CATHETERS) ×1 IMPLANT
CATH INFINITI 5FR ANG PIGTAIL (CATHETERS) ×1 IMPLANT
CATH INFINITI 5FR JK (CATHETERS) ×1 IMPLANT
CATH INFINITI JR4 5F (CATHETERS) ×1 IMPLANT
DEVICE RAD COMP TR BAND LRG (VASCULAR PRODUCTS) ×1 IMPLANT
GLIDESHEATH SLEND SS 6F .021 (SHEATH) ×1 IMPLANT
GUIDEWIRE INQWIRE 1.5J.035X260 (WIRE) IMPLANT
INQWIRE 1.5J .035X260CM (WIRE) ×2
KIT HEART LEFT (KITS) ×2 IMPLANT
PACK CARDIAC CATHETERIZATION (CUSTOM PROCEDURE TRAY) ×2 IMPLANT
TRANSDUCER W/STOPCOCK (MISCELLANEOUS) ×2 IMPLANT
TUBING CIL FLEX 10 FLL-RA (TUBING) ×2 IMPLANT

## 2021-07-27 NOTE — H&P (Signed)
History and Physical    [name] [name]:[id number] DOB: 1934/04/07 DOA: 07/27/2021  PCP: Hoyt Koch, MD  Patient coming from: Home  Chief Complaint: Danny [name]  HPI: Danny [name] is a 88 y.o. male with medical history significant of hypertension comes in after being found unresponsive and shaking by his wife.  Patient came in via EMS and was called in as a code STEMI.  Patient was noted to be hypotensive with systolics in the 03J and 00X and bradycardic.  He became combative in the ambulance was given Versed and IV fluids.  There was a period of time in the emergency department where he was not to his baseline.  All the history pertaining to arrival is obtained from medical records in the ED.  At this time he is alert and oriented and back to his normal self.  He does not recall the events that occurred leading up to his emergency room visit.  He does report he has not had any recent illnesses.  He denies any chest pain prior to the event or now.  He denies any shortness of breath.  He denies any recent illnesses no fevers no chills no dyspnea on exertion no lower extremity swelling no nausea vomiting or diarrhea.  No recent COVID or influenza infections.  He does report he has been under a lot of stress recently due to his wife having illness.  Code STEMI was canceled by cardiology service as per patient did not have chest pain and initial troponin was negative.  His repeat troponin is over 900 cardiology has been consulted.  He has no current focal neurological symptoms.  There is no other etiology explanation of his symptoms.  He denies any current weakness or focal neurological deficits.  He is never had a stroke or seizure in the past.   Review of Systems: As per HPI otherwise 10 point review of systems negative.   Past Medical History:  Diagnosis Date   Acid reflux    hiatal hernia   Diverticulitis    Erectile dysfunction 04/20/2015   Essential hypertension 04/20/2015    History of colon polyps    Hyperlipidemia 05/28/2015    Past Surgical History:  Procedure Laterality Date   APPENDECTOMY     cataract surgery       reports that he has quit smoking. His smoking use included cigars. He has never used smokeless tobacco. He reports that he does not drink alcohol and does not use drugs.  Allergies  Allergen Reactions   Lipitor [Atorvastatin]     Muscle soreness    Family History  Problem Relation Age of Onset   Stroke Father 69   Sudden death Mother 10       unknown cause    Prior to Admission medications   Medication Sig Start Date End Date Taking? Authorizing Provider  aspirin EC 81 MG tablet Take 81 mg by mouth daily. Swallow whole.   Yes [provider]  hydrochlorothiazide (HYDRODIURIL) 25 MG tablet Take 1 tablet by mouth once daily 12/29/20  Yes Nahser, Wonda Cheng, MD  losartan (COZAAR) 100 MG tablet Take 1 tablet by mouth once daily 12/29/20  Yes Nahser, Wonda Cheng, MD  potassium chloride (KLOR-CON) 10 MEQ tablet Take 1 tablet by mouth once daily 12/29/20  Yes Nahser, Wonda Cheng, MD  triamcinolone ointment (KENALOG) 0.1 % Apply 1 application topically 2 (two) times daily.   Yes [provider]  ezetimibe (ZETIA) 10 MG tablet Take 1 tablet (  10 mg total) by mouth daily. Patient not taking: Reported on 07/27/2021 10/27/20   Nahser, Wonda Cheng, MD  hyoscyamine (LEVSIN SL) 0.125 MG SL tablet Place 1 tablet (0.125 mg total) under the tongue every 8 (eight) hours as needed for cramping. Patient not taking: Reported on 07/27/2021 09/30/20   Loralie Champagne, PA-C    Physical Exam: Vitals:   07/27/21 0605 07/27/21 0630 07/27/21 0645 07/27/21 0700  BP: (!) 81/44 98/60 (!) 104/56   Pulse: (!) 41 75 67   Resp: 13 14 12    Temp:      TempSrc:      SpO2: 99% 97% 96%   Weight:    68 kg  Height:    5\' 4"  (1.626 m)      Constitutional: NAD, calm, comfortable Vitals:   07/27/21 0605 07/27/21 0630 07/27/21 0645 07/27/21 0700  BP: (!) 81/44  98/60 (!) 104/56   Pulse: (!) 41 75 67   Resp: 13 14 12    Temp:      TempSrc:      SpO2: 99% 97% 96%   Weight:    68 kg  Height:    5\' 4"  (1.626 m)   Eyes: PERRL, lids and conjunctivae normal ENMT: Mucous membranes are moist. Posterior pharynx clear of any exudate or lesions.Normal dentition.  Neck: normal, supple, no masses, no thyromegaly Respiratory: clear to auscultation bilaterally, no wheezing, no crackles. Normal respiratory effort. No accessory muscle use.  Cardiovascular: Regular rate and rhythm, no murmurs / rubs / gallops. No extremity edema. 2+ pedal pulses. No carotid bruits.  Abdomen: no tenderness, no masses palpated. No hepatosplenomegaly. Bowel sounds positive.  Musculoskeletal: no clubbing / cyanosis. No joint deformity upper and lower extremities. Good ROM, no contractures. Normal muscle tone.  Skin: no rashes, lesions, ulcers. No induration Neurologic: CN 2-12 grossly intact. Sensation intact, DTR normal. Strength 5/5 in all 4.  Psychiatric: Normal judgment and insight. Alert and oriented x 3. Normal mood.    Labs on Admission: I have personally reviewed following labs and imaging studies  CBC: Recent Labs  Lab 07/27/21 0025 07/27/21 0028  WBC 5.4  --   NEUTROABS 3.0  --   HGB 9.8* 9.9*  HCT 28.9* 29.0*  MCV 100.7*  --   PLT 144*  --    Basic Metabolic Panel: Recent Labs  Lab 07/27/21 0025 07/27/21 0028  NA 135 138  K 3.8 3.8  CL 107 105  CO2 19*  --   GLUCOSE 117* 114*  BUN 28* 26*  CREATININE 1.29* 1.20  CALCIUM 8.0*  --    GFR: Estimated Creatinine Clearance: 37.7 mL/min (by C-G formula based on SCr of 1.2 mg/dL). Liver Function Tests: Recent Labs  Lab 07/27/21 0025  AST 22  ALT 17  ALKPHOS 50  BILITOT 0.3  PROT 5.6*  ALBUMIN 3.2*   No results for input(s): LIPASE, AMYLASE in the last 168 hours. No results for input(s): AMMONIA in the last 168 hours. Coagulation Profile: Recent Labs  Lab 07/27/21 0025  INR 1.0   Cardiac  Enzymes: No results for input(s): CKTOTAL, CKMB, CKMBINDEX, TROPONINI in the last 168 hours. BNP (last 3 results) No results for input(s): PROBNP in the last 8760 hours. HbA1C: Recent Labs    07/27/21 0025  HGBA1C 5.5   CBG: No results for input(s): GLUCAP in the last 168 hours. Lipid Profile: Recent Labs    07/27/21 0025  CHOL 174  HDL 58  LDLCALC 105*  TRIG 57  CHOLHDL 3.0   Thyroid Function Tests: No results for input(s): TSH, T4TOTAL, FREET4, T3FREE, THYROIDAB in the last 72 hours. Anemia Panel: No results for input(s): VITAMINB12, FOLATE, FERRITIN, TIBC, IRON, RETICCTPCT in the last 72 hours. Urine analysis:    Component Value Date/Time   COLORURINE YELLOW 07/27/2021 0436   APPEARANCEUR CLEAR 07/27/2021 0436   LABSPEC 1.010 07/27/2021 0436   PHURINE 6.5 07/27/2021 0436   GLUCOSEU NEGATIVE 07/27/2021 0436   HGBUR TRACE (A) 07/27/2021 0436   BILIRUBINUR NEGATIVE 07/27/2021 0436   BILIRUBINUR negative 07/22/2019 1602   KETONESUR NEGATIVE 07/27/2021 0436   PROTEINUR NEGATIVE 07/27/2021 0436   UROBILINOGEN 0.2 07/22/2019 1602   UROBILINOGEN 0.2 11/28/2013 2032   NITRITE NEGATIVE 07/27/2021 0436   LEUKOCYTESUR NEGATIVE 07/27/2021 0436   Sepsis Labs: !!!!!!!!!!!!!!!!!!!!!!!!!!!!!!!!!!!!!!!!!!!! @LABRCNTIP (procalcitonin:4,lacticidven:4) ) Recent Results (from the past 240 hour(s))  Resp Panel by RT-PCR (Flu A&B, Covid) Nasopharyngeal Swab     Status: None   Collection Time: 07/27/21 12:25 AM   Specimen: Nasopharyngeal Swab; Nasopharyngeal(NP) swabs in vial transport medium  Result Value Ref Range Status   SARS Coronavirus 2 by RT PCR NEGATIVE NEGATIVE Final    Comment: (NOTE) SARS-CoV-2 target nucleic acids are NOT DETECTED.  The SARS-CoV-2 RNA is generally detectable in upper respiratory specimens during the acute phase of infection. The lowest concentration of SARS-CoV-2 viral copies this assay can detect is 138 copies/mL. A negative result does not preclude  SARS-Cov-2 infection and should not be used as the sole basis for treatment or other patient management decisions. A negative result may occur with  improper specimen collection/handling, submission of specimen other than nasopharyngeal swab, presence of viral mutation(s) within the areas targeted by this assay, and inadequate number of viral copies(<138 copies/mL). A negative result must be combined with clinical observations, patient history, and epidemiological information. The expected result is Negative.  Fact Sheet for Patients:  EntrepreneurPulse.com.au  Fact Sheet for Healthcare Providers:  IncredibleEmployment.be  This test is no t yet approved or cleared by the [place] FDA and  has been authorized for detection and/or diagnosis of SARS-CoV-2 by FDA under an Emergency Use Authorization (EUA). This EUA will remain  in effect (meaning this test can be used) for the duration of the COVID-19 declaration under Section 564(b)(1) of the Act, 21 U.S.C.section 360bbb-3(b)(1), unless the authorization is terminated  or revoked sooner.       Influenza A by PCR NEGATIVE NEGATIVE Final   Influenza B by PCR NEGATIVE NEGATIVE Final    Comment: (NOTE) The Xpert Xpress SARS-CoV-2/FLU/RSV plus assay is intended as an aid in the diagnosis of influenza from Nasopharyngeal swab specimens and should not be used as a sole basis for treatment. Nasal washings and aspirates are unacceptable for Xpert Xpress SARS-CoV-2/FLU/RSV testing.  Fact Sheet for Patients: EntrepreneurPulse.com.au  Fact Sheet for Healthcare Providers: IncredibleEmployment.be  This test is not yet approved or cleared by the [place] FDA and has been authorized for detection and/or diagnosis of SARS-CoV-2 by FDA under an Emergency Use Authorization (EUA). This EUA will remain in effect (meaning this test can be used) for the duration of  the COVID-19 declaration under Section 564(b)(1) of the Act, 21 U.S.C. section 360bbb-3(b)(1), unless the authorization is terminated or revoked.  Performed at [facility], [address] [place], [place] [address]      Radiological Exams on Admission: CT ANGIO HEAD NECK W WO CM  Result Date: 07/27/2021 CLINICAL DATA:  Initial evaluation for acute altered mental status.  EXAM: CT ANGIOGRAPHY HEAD AND NECK TECHNIQUE: Multidetector CT imaging of the head and neck was performed using the standard protocol during bolus administration of intravenous contrast. Multiplanar CT image reconstructions and MIPs were obtained to evaluate the vascular anatomy. Carotid stenosis measurements (when applicable) are obtained utilizing NASCET criteria, using the distal internal carotid diameter as the denominator. RADIATION DOSE REDUCTION: This exam was performed according to the departmental dose-optimization program which includes automated exposure control, adjustment of the mA and/or kV according to patient size and/or use of iterative reconstruction technique. CONTRAST:  23mL OMNIPAQUE IOHEXOL 350 MG/ML SOLN COMPARISON:  Head CT from earlier the same day. FINDINGS: CTA NECK FINDINGS Aortic arch: Visualized aortic arch normal caliber with normal branch pattern. Moderate atheromatous change about the arch itself. No hemodynamically significant stenosis about the origin the great vessels. Right carotid system: Right CCA patent from its origin to the bifurcation without stenosis. Scattered mixed plaque about the right carotid bulb/proximal right ICA without hemodynamically significant stenosis. Right ICA patent distally without stenosis or dissection. Left carotid system: Left CCA patent from its origin to the bifurcation without stenosis. Bulky calcified plaque about the left carotid bulb/proximal left ICA without hemodynamically significant stenosis. Left ICA patent distally without stenosis or dissection.  Vertebral arteries: Both vertebral arteries arise from the subclavian arteries. Atheromatous change at the origin of the right vertebral artery with no more than mild stenosis. Vertebral arteries otherwise patent distally without stenosis or dissection. Skeleton: No discrete or worrisome osseous lesions. Mild-to-moderate spondylosis at C3-4 through C6-7. Patient is edentulous. Other neck: No other acute soft tissue abnormality within the neck. Upper chest: Visualized upper chest demonstrates no acute finding. Review of the MIP images confirms the above findings CTA HEAD FINDINGS Anterior circulation: Petrous segments patent bilaterally. Scattered atheromatous change within the carotid siphons without hemodynamically significant stenosis. A1 segments patent bilaterally. Normal anterior communicating artery complex. Anterior cerebral arteries patent without stenosis. No M1 stenosis or occlusion. Normal MCA bifurcations. Distal MCA branches well perfused and symmetric. Posterior circulation: Both V4 segments patent without stenosis. Both PICA origins patent and normal. Basilar patent to its distal aspect without stenosis. Superior cerebellar arteries patent bilaterally. Both PCAs primarily supplied via the basilar. Atheromatous change throughout the PCAs bilaterally with associated moderate left P2 stenosis (series 12, image 25). PCAs remain patent to their distal aspects. Venous sinuses: Grossly patent allowing for timing the contrast bolus. Anatomic variants: None significant.  No aneurysm. Review of the MIP images confirms the above findings IMPRESSION: 1. Negative CTA for large vessel occlusion. 2. Moderate atheromatous change about the carotid bifurcations and carotid siphons without hemodynamically significant stenosis. 3. Moderate left P2 stenosis. Electronically Signed   By: Jeannine Boga M.D.   On: 07/27/2021 02:36   CT Head Wo Contrast  Result Date: 07/27/2021 CLINICAL DATA:  Mental status change,  unknown cause EXAM: CT HEAD WITHOUT CONTRAST TECHNIQUE: Contiguous axial images were obtained from the base of the skull through the vertex without intravenous contrast. RADIATION DOSE REDUCTION: This exam was performed according to the departmental dose-optimization program which includes automated exposure control, adjustment of the mA and/or kV according to patient size and/or use of iterative reconstruction technique. COMPARISON:  None. BRAIN: BRAIN Cerebral ventricle sizes are concordant with the degree of cerebral volume loss. Patchy and confluent areas of decreased attenuation are noted throughout the deep and periventricular white matter of the cerebral hemispheres bilaterally, compatible with chronic microvascular ischemic disease. No evidence of large-territorial acute infarction. No parenchymal hemorrhage. No  mass lesion. No extra-axial collection. No mass effect or midline shift. No hydrocephalus. Basilar cisterns are patent. Vascular: No hyperdense vessel. Atherosclerotic calcifications are present within the cavernous internal carotid and vertebral arteries. Skull: No acute fracture or focal lesion. Sinuses/Orbits: Paranasal sinuses and mastoid air cells are clear. Bilateral lens replacement. Otherwise the orbits are unremarkable. Other: None. IMPRESSION: No acute intracranial abnormality in a patient with diffuse atrophy and chronic microvascular ischemic changes. Electronically Signed   By: Iven Finn M.D.   On: 07/27/2021 01:40   MR BRAIN WO CONTRAST  Result Date: 07/27/2021 CLINICAL DATA:  88 year old male with unexplained altered mental status. EXAM: MRI HEAD WITHOUT CONTRAST TECHNIQUE: Multiplanar, multiecho pulse sequences of the brain and surrounding structures were obtained without intravenous contrast. COMPARISON:  CT head, CTA head and neck earlier today. FINDINGS: Brain: No restricted diffusion to suggest acute infarction. No midline shift, mass effect, evidence of mass lesion,  ventriculomegaly, extra-axial collection or acute intracranial hemorrhage. Cervicomedullary junction and pituitary are within normal limits. Patchy and confluent widespread bilateral cerebral white matter T2 and FLAIR hyperintensity. No cortical encephalomalacia. No chronic cerebral blood products identified. Chronic lacunar infarct lateral left thalamus. But the other deep gray matter nuclei are within normal limits for age. Brainstem and cerebellum within normal limits for age. Vascular: Major intracranial vascular flow voids are preserved. Skull and upper cervical spine: Negative. Sinuses/Orbits: Postoperative changes to both globes. Otherwise negative orbits. Trace paranasal sinus mucosal thickening. Other: Mastoids are clear. Visible internal auditory structures appear normal. Scalp and face appear negative. IMPRESSION: 1. No acute intracranial abnormality. 2. 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 Electronically Signed   By: Genevie Ann M.D.   On: 07/27/2021 05:29   DG Chest Port 1 View  Result Date: 07/27/2021 CLINICAL DATA:  Chest pain. EXAM: PORTABLE CHEST 1 VIEW COMPARISON:  Chest radiograph dated 10/06/2005 FINDINGS: No focal consolidation, pleural effusion, or pneumothorax. Top-normal cardiac size. Atherosclerotic calcification of the aorta. No acute osseous pathology. IMPRESSION: No active disease. Electronically Signed   By: Anner Crete M.D.   On: 07/27/2021 00:59    EKG: Independently reviewed.  Initial EKG normal sinus rhythm with ST segment elevation in inferior leads.  Repeat EKG pending  Assessment/Plan  88 year old male with an unresponsive episode with "shaking" appearance of unclear etiology with possible STEMI  Principal Problem:    Seizure-like activity (HCC)-unclear if cardiac or etiology of seizure like activity.  MRI shows no acute intracranial  abnormality but a chronic lacunar infarct in the left thalamus.  Second troponin has bumped over 900.  His EKG changes however did resolve after IV fluid resuscitation.  Suspect his episode was likely cardiac in origin.  Cardiology has been formally consulted.  Patient currently on heparin drip.  Active Problems:    Essential hypertension-hold all blood pressure medication at this time in the setting of hypotension on arrival and bradycardia.    CKD (chronic kidney disease) stage 3, GFR 30-59 ml/min (HCC)-stable at baseline  Possible STEMI (ST elevation myocardial infarction) (HCC)-repeat EKG right now.  Cardiology has been consulted placed on heparin drip.  Will likely go for left heart cath.  Blood pressure much improved now.  Hypotensive and bradycardic on arrival.  Check fasting lipid panel.  Check hemoglobin A1c.  Asymptomatic with no chest pain and no cardiac symptoms at all.  Second troponin over 900.  Await final cardiology recommendations.  Aspirin ordered.    Further recommendations pending overall hospital course   DVT prophylaxis: On heparin drip Code Status: Full Family Communication: Pastor at bedside Disposition Plan: 1 to 3 days Consults called: Cardiology and neurology Admission status: Observation   Elon Eoff A MD [facility]  If 7PM-7AM, please contact night-coverage [email or web address] Password [facility]  07/27/2021, 10:17 AM

## 2021-07-27 NOTE — ED Notes (Signed)
Patient transported to MRI 

## 2021-07-27 NOTE — ED Provider Notes (Signed)
[facility] EMERGENCY DEPARTMENT Provider Note  CSN: [id number] Arrival date & time: 07/27/21 0018  Chief Complaint(s) Code STEMI  HPI Danny [name] is a 88 y.o. male with a past medical history listed below including hypertension who presents to the emergency department by EMS as a code STEMI noted to have inferior ST segment elevation with reciprocal changes.  EMS was called out for episode of unresponsiveness where patient had generalized shaking for less than a minute and prolonged unresponsiveness.  Upon EMS arrival patient was unresponsive.  Noted to be hypotensive with systolics in the 37C and 58I.  Patient also bradycardic.  In route patient became agitated and combative requiring Versed.  He was given IV fluids.  Activated in route.  Remainder of history, ROS, and physical exam limited due to patient's condition (unresponsiveness). Additional information was obtained from EMS and family.   Level V Caveat.  I spoke with family who reports patient has not been complaining of any chest pain or shortness of breath.  Denies any recent fevers or infections.  No coughing or congestion.  No nausea or vomiting.  No diarrhea. HPI  Past Medical History Past Medical History:  Diagnosis Date   Acid reflux    hiatal hernia   Diverticulitis    Erectile dysfunction 04/20/2015   Essential hypertension 04/20/2015   History of colon polyps    Hyperlipidemia 05/28/2015   Patient Active Problem List   Diagnosis Date Noted   Seizure-like activity ([place]) 07/27/2021   Generalized abdominal pain 09/30/2020   Loss of weight 09/30/2020   CKD (chronic kidney disease) stage 3, GFR 30-59 ml/min (HCC) 11/21/2019   Transient alteration of awareness 07/25/2018   Syncope 07/25/2018   Hyperlipidemia 05/28/2015   Medicare annual wellness visit, subsequent 04/20/2015   Erectile dysfunction 04/20/2015   Essential hypertension 04/20/2015   Home Medication(s) Prior to Admission  medications   Medication Sig Start Date End Date Taking? Authorizing Provider  aspirin EC 81 MG tablet Take 81 mg by mouth daily. Swallow whole.   Yes [provider]  hydrochlorothiazide (HYDRODIURIL) 25 MG tablet Take 1 tablet by mouth once daily 12/29/20  Yes Nahser, Wonda Cheng, MD  losartan (COZAAR) 100 MG tablet Take 1 tablet by mouth once daily 12/29/20  Yes Nahser, Wonda Cheng, MD  potassium chloride (KLOR-CON) 10 MEQ tablet Take 1 tablet by mouth once daily 12/29/20  Yes Nahser, Wonda Cheng, MD  triamcinolone ointment (KENALOG) 0.1 % Apply 1 application topically 2 (two) times daily.   Yes [provider]  ezetimibe (ZETIA) 10 MG tablet Take 1 tablet (10 mg total) by mouth daily. Patient not taking: Reported on 07/27/2021 10/27/20   Nahser, Wonda Cheng, MD  hyoscyamine (LEVSIN SL) 0.125 MG SL tablet Place 1 tablet (0.125 mg total) under the tongue every 8 (eight) hours as needed for cramping. Patient not taking: Reported on 07/27/2021 09/30/20   Loralie Champagne, PA-C  Allergies Lipitor [atorvastatin]  Review of Systems Review of Systems As noted in HPI  Physical Exam Vital Signs  I have reviewed the triage vital signs BP (!) 104/56    Pulse 67    Temp (!) 96.5 F (35.8 C) (Axillary)    Resp 12    Ht 5\' 4"  (1.626 m)    Wt 68 kg    SpO2 96%    BMI 25.75 kg/m   Physical Exam Vitals reviewed.  Constitutional:      General: He is not in acute distress.    Appearance: He is well-developed. He is not diaphoretic.  HENT:     Head: Normocephalic and atraumatic.     Nose: Nose normal.  Eyes:     General: No scleral icterus.       Right eye: No discharge.        Left eye: No discharge.     Conjunctiva/sclera: Conjunctivae normal.     Pupils: Pupils are equal, round, and reactive to light.  Cardiovascular:     Rate and Rhythm: Normal rate and regular  rhythm.     Heart sounds: No murmur heard.   No friction rub. No gallop.  Pulmonary:     Effort: Pulmonary effort is normal. No respiratory distress.     Breath sounds: Normal breath sounds. No stridor. No rales.  Abdominal:     General: There is no distension.     Palpations: Abdomen is soft.     Tenderness: There is no abdominal tenderness.  Musculoskeletal:        General: No tenderness.     Cervical back: Normal range of motion and neck supple.  Skin:    General: Skin is warm and dry.     Findings: No erythema or rash.  Neurological:     Mental Status: He is lethargic and disoriented.     Comments: Intermittently follows commands and moving all extremities with good strength    ED Results and Treatments Labs (all labs ordered are listed, but only abnormal results are displayed) Labs Reviewed  CBC WITH DIFFERENTIAL/PLATELET - Abnormal; Notable for the following components:      Result Value   RBC 2.87 (*)    Hemoglobin 9.8 (*)    HCT 28.9 (*)    MCV 100.7 (*)    MCH 34.1 (*)    Platelets 144 (*)    All other components within normal limits  COMPREHENSIVE METABOLIC PANEL - Abnormal; Notable for the following components:   CO2 19 (*)    Glucose, Bld 117 (*)    BUN 28 (*)    Creatinine, Ser 1.29 (*)    Calcium 8.0 (*)    Total Protein 5.6 (*)    Albumin 3.2 (*)    GFR, Estimated 54 (*)    All other components within normal limits  LIPID PANEL - Abnormal; Notable for the following components:   LDL Cholesterol 105 (*)    All other components within normal limits  URINALYSIS, ROUTINE W REFLEX MICROSCOPIC - Abnormal; Notable for the following components:   Hgb urine dipstick TRACE (*)    All other components within normal limits  URINALYSIS, MICROSCOPIC (REFLEX) - Abnormal; Notable for the following components:   Bacteria, UA RARE (*)    All other components within normal limits  I-STAT CHEM 8, ED - Abnormal; Notable for the following components:   BUN 26 (*)     Glucose, Bld 114 (*)    Calcium, Ion 1.08 (*)  TCO2 20 (*)    Hemoglobin 9.9 (*)    HCT 29.0 (*)    All other components within normal limits  TROPONIN I (HIGH SENSITIVITY) - Abnormal; Notable for the following components:   Troponin I (High Sensitivity) 61 (*)    All other components within normal limits  TROPONIN I (HIGH SENSITIVITY) - Abnormal; Notable for the following components:   Troponin I (High Sensitivity) 993 (*)    All other components within normal limits  RESP PANEL BY RT-PCR (FLU A&B, COVID) ARPGX2  HEMOGLOBIN A1C  PROTIME-INR  APTT  BRAIN NATRIURETIC PEPTIDE  HEPARIN LEVEL (UNFRACTIONATED)  POC OCCULT BLOOD, ED  TROPONIN I (HIGH SENSITIVITY)  TROPONIN I (HIGH SENSITIVITY)                                                                                                                         EKG  EKG Interpretation  Date/Time:  Wednesday July 27 2021 00:24:59 EST Ventricular Rate:  63 PR Interval:  211 QRS Duration: 103 QT Interval:  439 QTC Calculation: 432 R Axis:   42 Text Interpretation: Sinus rhythm Supraventricular bigeminy Minimal ST elevation, inferior leads Confirmed by Addison Lank [phone]) on 07/27/2021 3:09:20 AM       Radiology CT ANGIO HEAD NECK W WO CM  Result Date: 07/27/2021 CLINICAL DATA:  Initial evaluation for acute altered mental status. EXAM: CT ANGIOGRAPHY HEAD AND NECK TECHNIQUE: Multidetector CT imaging of the head and neck was performed using the standard protocol during bolus administration of intravenous contrast. Multiplanar CT image reconstructions and MIPs were obtained to evaluate the vascular anatomy. Carotid stenosis measurements (when applicable) are obtained utilizing NASCET criteria, using the distal internal carotid diameter as the denominator. RADIATION DOSE REDUCTION: This exam was performed according to the departmental dose-optimization program which includes automated exposure control, adjustment of the mA and/or kV  according to patient size and/or use of iterative reconstruction technique. CONTRAST:  2mL OMNIPAQUE IOHEXOL 350 MG/ML SOLN COMPARISON:  Head CT from earlier the same day. FINDINGS: CTA NECK FINDINGS Aortic arch: Visualized aortic arch normal caliber with normal branch pattern. Moderate atheromatous change about the arch itself. No hemodynamically significant stenosis about the origin the great vessels. Right carotid system: Right CCA patent from its origin to the bifurcation without stenosis. Scattered mixed plaque about the right carotid bulb/proximal right ICA without hemodynamically significant stenosis. Right ICA patent distally without stenosis or dissection. Left carotid system: Left CCA patent from its origin to the bifurcation without stenosis. Bulky calcified plaque about the left carotid bulb/proximal left ICA without hemodynamically significant stenosis. Left ICA patent distally without stenosis or dissection. Vertebral arteries: Both vertebral arteries arise from the subclavian arteries. Atheromatous change at the origin of the right vertebral artery with no more than mild stenosis. Vertebral arteries otherwise patent distally without stenosis or dissection. Skeleton: No discrete or worrisome osseous lesions. Mild-to-moderate spondylosis at C3-4 through C6-7. Patient is edentulous. Other neck: No other acute soft tissue abnormality within  the neck. Upper chest: Visualized upper chest demonstrates no acute finding. Review of the MIP images confirms the above findings CTA HEAD FINDINGS Anterior circulation: Petrous segments patent bilaterally. Scattered atheromatous change within the carotid siphons without hemodynamically significant stenosis. A1 segments patent bilaterally. Normal anterior communicating artery complex. Anterior cerebral arteries patent without stenosis. No M1 stenosis or occlusion. Normal MCA bifurcations. Distal MCA branches well perfused and symmetric. Posterior circulation: Both V4  segments patent without stenosis. Both PICA origins patent and normal. Basilar patent to its distal aspect without stenosis. Superior cerebellar arteries patent bilaterally. Both PCAs primarily supplied via the basilar. Atheromatous change throughout the PCAs bilaterally with associated moderate left P2 stenosis (series 12, image 25). PCAs remain patent to their distal aspects. Venous sinuses: Grossly patent allowing for timing the contrast bolus. Anatomic variants: None significant.  No aneurysm. Review of the MIP images confirms the above findings IMPRESSION: 1. Negative CTA for large vessel occlusion. 2. Moderate atheromatous change about the carotid bifurcations and carotid siphons without hemodynamically significant stenosis. 3. Moderate left P2 stenosis. Electronically Signed   By: Jeannine Boga M.D.   On: 07/27/2021 02:36   CT Head Wo Contrast  Result Date: 07/27/2021 CLINICAL DATA:  Mental status change, unknown cause EXAM: CT HEAD WITHOUT CONTRAST TECHNIQUE: Contiguous axial images were obtained from the base of the skull through the vertex without intravenous contrast. RADIATION DOSE REDUCTION: This exam was performed according to the departmental dose-optimization program which includes automated exposure control, adjustment of the mA and/or kV according to patient size and/or use of iterative reconstruction technique. COMPARISON:  None. BRAIN: BRAIN Cerebral ventricle sizes are concordant with the degree of cerebral volume loss. Patchy and confluent areas of decreased attenuation are noted throughout the deep and periventricular white matter of the cerebral hemispheres bilaterally, compatible with chronic microvascular ischemic disease. No evidence of large-territorial acute infarction. No parenchymal hemorrhage. No mass lesion. No extra-axial collection. No mass effect or midline shift. No hydrocephalus. Basilar cisterns are patent. Vascular: No hyperdense vessel. Atherosclerotic  calcifications are present within the cavernous internal carotid and vertebral arteries. Skull: No acute fracture or focal lesion. Sinuses/Orbits: Paranasal sinuses and mastoid air cells are clear. Bilateral lens replacement. Otherwise the orbits are unremarkable. Other: None. IMPRESSION: No acute intracranial abnormality in a patient with diffuse atrophy and chronic microvascular ischemic changes. Electronically Signed   By: Iven Finn M.D.   On: 07/27/2021 01:40   MR BRAIN WO CONTRAST  Result Date: 07/27/2021 CLINICAL DATA:  88 year old male with unexplained altered mental status. EXAM: MRI HEAD WITHOUT CONTRAST TECHNIQUE: Multiplanar, multiecho pulse sequences of the brain and surrounding structures were obtained without intravenous contrast. COMPARISON:  CT head, CTA head and neck earlier today. FINDINGS: Brain: No restricted diffusion to suggest acute infarction. No midline shift, mass effect, evidence of mass lesion, ventriculomegaly, extra-axial collection or acute intracranial hemorrhage. Cervicomedullary junction and pituitary are within normal limits. Patchy and confluent widespread bilateral cerebral white matter T2 and FLAIR hyperintensity. No cortical encephalomalacia. No chronic cerebral blood products identified. Chronic lacunar infarct lateral left thalamus. But the other deep gray matter nuclei are within normal limits for age. Brainstem and cerebellum within normal limits for age. Vascular: Major intracranial vascular flow voids are preserved. Skull and upper cervical spine: Negative. Sinuses/Orbits: Postoperative changes to both globes. Otherwise negative orbits. Trace paranasal sinus mucosal thickening. Other: Mastoids are clear. Visible internal auditory structures appear normal. Scalp and face appear negative. IMPRESSION: 1. No acute intracranial abnormality. 2. Chronic lacunar infarct  in the left thalamus, and advanced cerebral white matter signal changes which are nonspecific but  most likely also small vessel disease related. Electronically Signed   By: Genevie Ann M.D.   On: 07/27/2021 05:29   DG Chest Port 1 View  Result Date: 07/27/2021 CLINICAL DATA:  Chest pain. EXAM: PORTABLE CHEST 1 VIEW COMPARISON:  Chest radiograph dated 10/06/2005 FINDINGS: No focal consolidation, pleural effusion, or pneumothorax. Top-normal cardiac size. Atherosclerotic calcification of the aorta. No acute osseous pathology. IMPRESSION: No active disease. Electronically Signed   By: Anner Crete M.D.   On: 07/27/2021 00:59    Pertinent labs & imaging results that were available during my care of the patient were reviewed by me and considered in my medical decision making (see MDM for details).  Medications Ordered in ED Medications  0.9 %  sodium chloride infusion ( Intravenous New Bag/Given 07/27/21 0745)  sodium chloride 0.9 % bolus 1,000 mL (0 mLs Intravenous Stopped 07/27/21 0559)    Followed by  0.9 %  sodium chloride infusion (1,000 mLs Intravenous Not Given 07/27/21 0602)  heparin ADULT infusion 100 units/mL (25000 units/291mL) (800 Units/hr Intravenous New Bag/Given 07/27/21 0758)  iohexol (OMNIPAQUE) 350 MG/ML injection 75 mL (75 mLs Intravenous Contrast Given 07/27/21 0127)  lactated ringers bolus 1,000 mL (0 mLs Intravenous Stopped 07/27/21 0700)  aspirin chewable tablet 324 mg (324 mg Oral Given 07/27/21 0754)  heparin bolus via infusion 3,500 Units (3,500 Units Intravenous Bolus from Bag 07/27/21 0758)                                                                                                                                     Procedures .1-3 Lead EKG Interpretation Performed by: Fatima Blank, MD Authorized by: Fatima Blank, MD     Interpretation: normal     ECG rate:  62   ECG rate assessment: normal     Rhythm: sinus rhythm     Ectopy: none     Conduction: normal   .Critical Care Performed by: Fatima Blank, MD Authorized by: Fatima Blank, MD   Critical care provider statement:    Critical care time (minutes):  80   Critical care time was exclusive of:  Separately billable procedures and treating other patients   Critical care was necessary to treat or prevent imminent or life-threatening deterioration of the following conditions:  Cardiac failure   Critical care was time spent personally by me on the following activities:  Development of treatment plan with patient or surrogate, discussions with consultants, evaluation of patient's response to treatment, examination of patient, obtaining history from patient or surrogate, review of old charts, re-evaluation of patient's condition, pulse oximetry, ordering and review of radiographic studies, ordering and review of laboratory studies and ordering and performing treatments and interventions   Care discussed with: admitting provider    (including critical care time)  Medical Decision Making /  ED Course        Unresponsiveness  Activated as a code STEMI with inferior changes History is concerning for syncope versus seizure versus stroke. Will need to assess for the above as well as aortic dissection. Cardiology at bedside and evaluated the patient.  Code STEMI was canceled based on history.  We will still follow along to rule out ACS.  Work-up ordered to assess concerns above.  Labs and imaging independently interpreted by me and noted below: EKG showed improved ST changes.  Still slightly elevated. Initial troponin was negative.  Second troponin was slightly elevated and felt to be related to demand ischemia from hypotensive episode. Third troponin elevated after patient has been admitted to the hospitalist service and cardiology has signed off. CBC without leukocytosis.  Notable for anemia with a hemoglobin of 9.8.  Hemoccult was negative. No significant electrolyte derangements. Mild AKI. No evidence of infection. On my read of the chest x-ray, there was no  evidence suggestive of pneumonia, pneumothorax, pneumomediastinum, pulmonary edema concerning for new or exacerbation of heart failure, abnormal contour of the mediastinum to suggest dissection, and no evidence of acute injuries. CT head negative for ICH. CTA of the head and neck negative for any evidence of aortic dissection or large vessel occlusion.  Radiology confirmed. Patient sent for MRI which revealed no evidence of acute stroke with a revealed remote lacunar stroke seen by radiology. No source of infection noted  Management: Patient provided with IV fluids. He was monitored and returned to baseline mental status. After troponins were elevated patient was given aspirin and started on heparin drip. Cardiology was reconsulted. Patient was admitted to the hospital service  Reassessment: Patient returned to baseline mental status denying any acute complaints specifically denying of any chest pain or shortness of breath.   Final Clinical Impression(s) / ED Diagnoses Final diagnoses:  Acute coronary syndrome with high troponin ([place])           This chart was dictated using voice recognition software.  Despite best efforts to proofread,  errors can occur which can change the documentation meaning.    Fatima Blank, MD 07/27/21 [id number]

## 2021-07-27 NOTE — H&P (View-Only) (Signed)
Cardiology Consultation:   Patient ID: Danny [name] MRN: [id number]; DOB: 28-Jul-1933  Admit date: 07/27/2021 Date of Consult: 07/27/2021  PCP:  Hoyt Koch, MD   [facility] Providers Cardiologist:  Mertie Moores, MD        Patient Profile:   Danny [name] is a 88 y.o. male with a hx of HTN, HLD, no hx of CAD  who is being seen 07/27/2021 for the evaluation of elevated troponins at the request of Dr. Myna Hidalgo.  History of Present Illness:   Danny [name] with no prior CAD and neg nuc for ischemia, and echo 09/2018 with normal LV function at 50-55% and mild AS.  Had been doing well.  Today presented to ER by EMS after becoming unresponsive and with shaking while sitting and EMS called.  His wife tells me it was a lot of shaking and he would not respond.  On EMS  arrival he was confused and combative.   EKG with ST elevation in III and Code STEMI was called but at that time neg troponin and no chest pain so was cancelled.   Now with increase of hs troponin to 5000.  Pt has been under a lot of stress, no hx of seizures. No recent infections.   Denies any blood in stools. Unaware of anemia.  Has not seen PCP in over 1 year.  He denis chest pain at anytime.  None leading up to event today.   Na 138, K+ 3.8 glucose 114, BUN 26 Cr 1.20 inoized ca+ 1.20 BNP 51 Hs troponin 9, 61, 993, 5,799 Tchol 174, HDL 58, LDL 105 TG 57  Hgb 9.8  Hct 28.9 plts 144 A1c 5.5 Flu neg.   EKG:  The EKG was personally reviewed and demonstrates:  SR at 63 with ST elevation inf leads.  Telemetry:  Telemetry was personally reviewed and demonstrates:  SR with PVC  CT of head no hemorrhage  MRI of brainwith no acute intracranial abnormality, Chronic lacunar infarct in the left thalamus, and advanced cerebral white matter signal changes which are nonspecific but most likely also small vessel disease related.  CT angio of head IMPRESSION: 1. Negative CTA for large vessel occlusion. 2. Moderate atheromatous  change about the carotid bifurcations and carotid siphons without hemodynamically significant stenosis. 3. Moderate left P2 stenosis.  PCXR no active disease.   BP 101/54 P 69 R 7 to 14 afebrile Resting comfortably no pain.     Past Medical History:  Diagnosis Date   Acid reflux    hiatal hernia   Diverticulitis    Erectile dysfunction 04/20/2015   Essential hypertension 04/20/2015   History of colon polyps    Hyperlipidemia 05/28/2015    Past Surgical History:  Procedure Laterality Date   APPENDECTOMY     cataract surgery       Home Medications:  Prior to Admission medications   Medication Sig Start Date End Date Taking? Authorizing Provider  aspirin EC 81 MG tablet Take 81 mg by mouth daily. Swallow whole.   Yes [provider]  hydrochlorothiazide (HYDRODIURIL) 25 MG tablet Take 1 tablet by mouth once daily 12/29/20  Yes Nahser, Wonda Cheng, MD  losartan (COZAAR) 100 MG tablet Take 1 tablet by mouth once daily 12/29/20  Yes Nahser, Wonda Cheng, MD  potassium chloride (KLOR-CON) 10 MEQ tablet Take 1 tablet by mouth once daily 12/29/20  Yes Nahser, Wonda Cheng, MD  triamcinolone ointment (KENALOG) 0.1 % Apply 1 application topically 2 (two) times daily.  Yes [provider]  ezetimibe (ZETIA) 10 MG tablet Take 1 tablet (10 mg total) by mouth daily. Patient not taking: Reported on 07/27/2021 10/27/20   Nahser, Wonda Cheng, MD  hyoscyamine (LEVSIN SL) 0.125 MG SL tablet Place 1 tablet (0.125 mg total) under the tongue every 8 (eight) hours as needed for cramping. Patient not taking: Reported on 07/27/2021 09/30/20   Loralie Champagne, PA-C    Inpatient Medications: Scheduled Meds:  [START ON 07/28/2021] aspirin EC  81 mg Oral Daily   Continuous Infusions:  sodium chloride 20 mL/hr at 07/27/21 0745   heparin 800 Units/hr (07/27/21 0758)   magnesium sulfate bolus IVPB     PRN Meds: acetaminophen, nitroGLYCERIN, ondansetron (ZOFRAN) IV  Allergies:    Allergies  Allergen  Reactions   Lipitor [Atorvastatin]     Muscle soreness    Social History:   Social History   Socioeconomic History   Marital status: Married    Spouse name: Not on file   Number of children: 1   Years of education: 12   Highest education level: Not on file  Occupational History   Occupation: Retired  Tobacco Use   Smoking status: Former    Types: Cigars   Smokeless tobacco: Never  Scientific laboratory technician Use: Never used  Substance and Sexual Activity   Alcohol use: No   Drug use: No   Sexual activity: Not on file  Other Topics Concern   Not on file  Social History Narrative   Lives with wife    Caffeine use: 1-2 cups coffee in the am and 1 glass tea qpm   Social Determinants of Health   Financial Resource Strain: Not on file  Food Insecurity: Not on file  Transportation Needs: Not on file  Physical Activity: Not on file  Stress: Not on file  Social Connections: Not on file  Intimate Partner Violence: Not on file    Family History:    Family History  Problem Relation Age of Onset   Stroke Father 46   Sudden death Mother 40       unknown cause     ROS:  Please see the history of present illness.  General:no colds or fevers, no weight changes Skin:no rashes or ulcers HEENT:no blurred vision, no congestion CV:see HPI PUL:see HPI GI:no diarrhea constipation or melena, no indigestion GU:no hematuria, no dysuria MS:no joint pain, no claudication Neuro:no syncope, no lightheadedness Endo:no diabetes, no thyroid disease  All other ROS reviewed and negative.     Physical Exam/Data:   Vitals:   07/27/21 0700 07/27/21 0800 07/27/21 0900 07/27/21 1000  BP: (!) 99/59 (!) 96/50 (!) 95/54 (!) 103/57  Pulse: 66 61 71 69  Resp: 14 16 12 14   Temp:      TempSrc:      SpO2: 97% 99% 93% 94%  Weight: 68 kg     Height: 5\' 4"  (1.626 m)       Intake/Output Summary (Last 24 hours) at 07/27/2021 1233 Last data filed at 07/27/2021 0700 Gross per 24 hour  Intake 965.7  ml  Output --  Net 965.7 ml   Last 3 Weights 07/27/2021 10/25/2020 09/30/2020  Weight (lbs) 150 lb 155 lb 9.6 oz 153 lb 4 oz  Weight (kg) 68.04 kg 70.58 kg 69.514 kg     Body mass index is 25.75 kg/m.  General:  Well nourished, well developed, in no acute distress HEENT: normal Neck: no JVD Vascular: No carotid bruits; Distal  pulses 2+ bilaterally Cardiac:  normal S1, S2; RRR; no murmur gallup rub or click  Lungs:  clear to auscultation bilaterally, no wheezing, rhonchi or rales  Abd: soft, nontender, no hepatomegaly  Ext: no edema Musculoskeletal:  No deformities, BUE and BLE strength normal and equal Skin: warm and dry  Neuro:  alert and oriented X 3  no focal abnormalities noted Psych:  Normal affect    Relevant CV Studies: TTE 09/24/18  IMPRESSIONS     1. The left ventricle has low normal systolic function, with an ejection  fraction of 50-55%. The cavity size was normal. Left ventricular diastolic  parameters were normal.   2. Distal septal hypokinesis.   3. The right ventricle has normal systolic function. The cavity was  normal. There is no increase in right ventricular wall thickness.   4. Left atrial size was mildly dilated.   5. Moderate thickening of the mitral valve leaflet. Moderate  calcification of the mitral valve leaflet. There is mild mitral annular  calcification present.   6. The aortic valve is tricuspid Moderate thickening of the aortic valve  Moderate calcification of the aortic valve. Aortic valve regurgitation is  mild by color flow Doppler.   7. Peak gradient 10 mmHg AVA 2.3 cm2.   FINDINGS   Left Ventricle: The left ventricle has low normal systolic function, with  an ejection fraction of 50-55%. The cavity size was normal. There is no  increase in left ventricular wall thickness. Left ventricular diastolic  parameters were normal. Distal  septal hypokinesis.  Right Ventricle: The right ventricle has normal systolic function. The  cavity was  normal. There is no increase in right ventricular wall  thickness.  Left Atrium: left atrial size was mildly dilated  Right Atrium: right atrial size was normal in size. Right atrial pressure  is estimated at 3 mmHg.  Interatrial Septum: No atrial level shunt detected by color flow Doppler.  Pericardium: There is no evidence of pericardial effusion.  Mitral Valve: The mitral valve is normal in structure. Moderate thickening  of the mitral valve leaflet. Moderate calcification of the mitral valve  leaflet. There is mild mitral annular calcification present. Mitral valve  regurgitation is trivial by color   flow Doppler.  Tricuspid Valve: The tricuspid valve is normal in structure. Tricuspid  valve regurgitation is mild by color flow Doppler.  Aortic Valve: The aortic valve is tricuspid Moderate thickening of the  aortic valve Moderate calcification of the aortic valve. Aortic valve  regurgitation is mild by color flow Doppler. There is no evidence of  aortic valve stenosis. Peak gradient 10 mmHg   AVA 2.3 cm2.  Pulmonic Valve: The pulmonic valve was grossly normal. Pulmonic valve  regurgitation is mild by color flow Doppler.  Venous: The inferior vena cava is normal in size with greater than 50%  respiratory variability.  Laboratory Data:  High Sensitivity Troponin:   Recent Labs  Lab 07/27/21 0025 07/27/21 0225 07/27/21 0530 07/27/21 0928  TROPONINIHS 9 61* 993* 5,799*     Chemistry Recent Labs  Lab 07/27/21 0025 07/27/21 0028 07/27/21 1004  NA 135 138  --   K 3.8 3.8  --   CL 107 105  --   CO2 19*  --   --   GLUCOSE 117* 114*  --   BUN 28* 26*  --   CREATININE 1.29* 1.20  --   CALCIUM 8.0*  --   --   MG  --   --  1.6*  GFRNONAA 54*  --   --   ANIONGAP 9  --   --     Recent Labs  Lab 07/27/21 0025  PROT 5.6*  ALBUMIN 3.2*  AST 22  ALT 17  ALKPHOS 50  BILITOT 0.3   Lipids  Recent Labs  Lab 07/27/21 0025  CHOL 174  TRIG 57  HDL 58  LDLCALC 105*   CHOLHDL 3.0    Hematology Recent Labs  Lab 07/27/21 0025 07/27/21 0028  WBC 5.4  --   RBC 2.87*  --   HGB 9.8* 9.9*  HCT 28.9* 29.0*  MCV 100.7*  --   MCH 34.1*  --   MCHC 33.9  --   RDW 13.6  --   PLT 144*  --    Thyroid No results for input(s): TSH, FREET4 in the last 168 hours.  BNP Recent Labs  Lab 07/27/21 0026  BNP 51.3    DDimer No results for input(s): DDIMER in the last 168 hours.   Radiology/Studies:  CT ANGIO HEAD NECK W WO CM  Result Date: 07/27/2021 CLINICAL DATA:  Initial evaluation for acute altered mental status. EXAM: CT ANGIOGRAPHY HEAD AND NECK TECHNIQUE: Multidetector CT imaging of the head and neck was performed using the standard protocol during bolus administration of intravenous contrast. Multiplanar CT image reconstructions and MIPs were obtained to evaluate the vascular anatomy. Carotid stenosis measurements (when applicable) are obtained utilizing NASCET criteria, using the distal internal carotid diameter as the denominator. RADIATION DOSE REDUCTION: This exam was performed according to the departmental dose-optimization program which includes automated exposure control, adjustment of the mA and/or kV according to patient size and/or use of iterative reconstruction technique. CONTRAST:  59mL OMNIPAQUE IOHEXOL 350 MG/ML SOLN COMPARISON:  Head CT from earlier the same day. FINDINGS: CTA NECK FINDINGS Aortic arch: Visualized aortic arch normal caliber with normal branch pattern. Moderate atheromatous change about the arch itself. No hemodynamically significant stenosis about the origin the great vessels. Right carotid system: Right CCA patent from its origin to the bifurcation without stenosis. Scattered mixed plaque about the right carotid bulb/proximal right ICA without hemodynamically significant stenosis. Right ICA patent distally without stenosis or dissection. Left carotid system: Left CCA patent from its origin to the bifurcation without stenosis. Bulky  calcified plaque about the left carotid bulb/proximal left ICA without hemodynamically significant stenosis. Left ICA patent distally without stenosis or dissection. Vertebral arteries: Both vertebral arteries arise from the subclavian arteries. Atheromatous change at the origin of the right vertebral artery with no more than mild stenosis. Vertebral arteries otherwise patent distally without stenosis or dissection. Skeleton: No discrete or worrisome osseous lesions. Mild-to-moderate spondylosis at C3-4 through C6-7. Patient is edentulous. Other neck: No other acute soft tissue abnormality within the neck. Upper chest: Visualized upper chest demonstrates no acute finding. Review of the MIP images confirms the above findings CTA HEAD FINDINGS Anterior circulation: Petrous segments patent bilaterally. Scattered atheromatous change within the carotid siphons without hemodynamically significant stenosis. A1 segments patent bilaterally. Normal anterior communicating artery complex. Anterior cerebral arteries patent without stenosis. No M1 stenosis or occlusion. Normal MCA bifurcations. Distal MCA branches well perfused and symmetric. Posterior circulation: Both V4 segments patent without stenosis. Both PICA origins patent and normal. Basilar patent to its distal aspect without stenosis. Superior cerebellar arteries patent bilaterally. Both PCAs primarily supplied via the basilar. Atheromatous change throughout the PCAs bilaterally with associated moderate left P2 stenosis (series 12, image 25). PCAs remain patent to their distal aspects.  Venous sinuses: Grossly patent allowing for timing the contrast bolus. Anatomic variants: None significant.  No aneurysm. Review of the MIP images confirms the above findings IMPRESSION: 1. Negative CTA for large vessel occlusion. 2. Moderate atheromatous change about the carotid bifurcations and carotid siphons without hemodynamically significant stenosis. 3. Moderate left P2 stenosis.  Electronically Signed   By: Jeannine Boga M.D.   On: 07/27/2021 02:36   CT Head Wo Contrast  Result Date: 07/27/2021 CLINICAL DATA:  Mental status change, unknown cause EXAM: CT HEAD WITHOUT CONTRAST TECHNIQUE: Contiguous axial images were obtained from the base of the skull through the vertex without intravenous contrast. RADIATION DOSE REDUCTION: This exam was performed according to the departmental dose-optimization program which includes automated exposure control, adjustment of the mA and/or kV according to patient size and/or use of iterative reconstruction technique. COMPARISON:  None. BRAIN: BRAIN Cerebral ventricle sizes are concordant with the degree of cerebral volume loss. Patchy and confluent areas of decreased attenuation are noted throughout the deep and periventricular white matter of the cerebral hemispheres bilaterally, compatible with chronic microvascular ischemic disease. No evidence of large-territorial acute infarction. No parenchymal hemorrhage. No mass lesion. No extra-axial collection. No mass effect or midline shift. No hydrocephalus. Basilar cisterns are patent. Vascular: No hyperdense vessel. Atherosclerotic calcifications are present within the cavernous internal carotid and vertebral arteries. Skull: No acute fracture or focal lesion. Sinuses/Orbits: Paranasal sinuses and mastoid air cells are clear. Bilateral lens replacement. Otherwise the orbits are unremarkable. Other: None. IMPRESSION: No acute intracranial abnormality in a patient with diffuse atrophy and chronic microvascular ischemic changes. Electronically Signed   By: Iven Finn M.D.   On: 07/27/2021 01:40   MR BRAIN WO CONTRAST  Result Date: 07/27/2021 CLINICAL DATA:  88 year old male with unexplained altered mental status. EXAM: MRI HEAD WITHOUT CONTRAST TECHNIQUE: Multiplanar, multiecho pulse sequences of the brain and surrounding structures were obtained without intravenous contrast. COMPARISON:  CT  head, CTA head and neck earlier today. FINDINGS: Brain: No restricted diffusion to suggest acute infarction. No midline shift, mass effect, evidence of mass lesion, ventriculomegaly, extra-axial collection or acute intracranial hemorrhage. Cervicomedullary junction and pituitary are within normal limits. Patchy and confluent widespread bilateral cerebral white matter T2 and FLAIR hyperintensity. No cortical encephalomalacia. No chronic cerebral blood products identified. Chronic lacunar infarct lateral left thalamus. But the other deep gray matter nuclei are within normal limits for age. Brainstem and cerebellum within normal limits for age. Vascular: Major intracranial vascular flow voids are preserved. Skull and upper cervical spine: Negative. Sinuses/Orbits: Postoperative changes to both globes. Otherwise negative orbits. Trace paranasal sinus mucosal thickening. Other: Mastoids are clear. Visible internal auditory structures appear normal. Scalp and face appear negative. IMPRESSION: 1. No acute intracranial abnormality. 2. 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 Electronically Signed   By: Genevie Ann M.D.   On: 07/27/2021 05:29   DG Chest Port 1 View  Result Date: 07/27/2021 CLINICAL DATA:  Chest pain. EXAM: PORTABLE CHEST 1 VIEW COMPARISON:  Chest radiograph dated 10/06/2005 FINDINGS: No focal consolidation, pleural effusion, or pneumothorax. Top-normal cardiac size. Atherosclerotic calcification of the aorta. No acute osseous pathology. IMPRESSION: No active disease. Electronically Signed   By: Anner Crete M.D.   On: 07/27/2021 00:59     Assessment and Plan:   NSTEMI on IV heparin.  Will need cardiac cath to eval with abnormal EKG as well.  Continue ASA -rec'd dose here possible BB though HR low at times. Will defer to MD  will check TSH as well. A1C is normal.  The  patient understands that risks included but are not limited to stroke (1 in 1000), death (1 in 1000), kidney failure [usually temporary] (1 in 500), bleeding (1 in 200), allergic reaction [possibly serious] (1 in 200).  Possible seizure vs syncope alone CT of head and MRI without acute issues - no CVA will need EEG and Neuro consult.  Wife notes he was shaking pretty hard with episode HTN on lower end of spectrum currently  holding HCTZ and home cozaar.  HLD on zetia alone did not tolerate lipitor, will add crestor for now, LDL 105 Anemia in 2020 hgb  was 11.4,  stool heme neg. Would recommend anemia panel.  No active bleeding per pt Hypomagnesium will give 2 gm IV    Risk Assessment/Risk Scores:     TIMI Risk Score for Unstable Angina or Non-ST Elevation MI:   The patient's TIMI risk score is 5, which indicates a 26% risk of all cause mortality, new or recurrent myocardial infarction or need for urgent revascularization in the next 14 days.          For questions or updates, please contact [place] Please consult [email or web address] for contact info under    Signed, Cecilie Kicks, NP  07/27/2021 12:33 PM  Danny [name] is an 88 year old delightful Caucasian male patient of Dr. Elmarie Shiley with history of hypertension hyperlipidemia.  He was admitted last night after a shaking episode that appeared to be a neurologic event although his MRI of his head was negative.  An EKG was performed that showed mild inferior ST segment elevation and a STEMI was initially activated but later called off.  Patient was asymptomatic.  He is had no chest pain.  His troponins however did go up to 5000.  He is on IV heparin.  His exam is benign.  Given his EKG changes and positive troponin (currently non-STEMI) I favor an invasive approach and have discussed coronary angiography with him which he agrees to. The patient understands that risks included but are not limited to stroke (1 in 1000), death (1 in 58), kidney  failure [usually temporary] (1 in 500), bleeding (1 in 200), allergic reaction [possibly serious] (1 in 200).  The patient understands and agrees to proceed     Agree with note written by Cecilie Kicks RNP Quay Burow 07/27/2021 1:35 PM

## 2021-07-27 NOTE — Progress Notes (Signed)
°  Transition of Care (TOC) Screening Note   Patient Details  Name: Danny [name] Date of Birth: 08/10/1933   [facility]) CM/SW Contact:    Milas Gain, [place] Phone Number: 07/27/2021, 5:19 PM    Transition of Care Department [facility]) has reviewed patient and no TOC needs have been identified at this time. We will continue to monitor patient advancement through interdisciplinary progression rounds. If new patient transition needs arise, please place a TOC consult.

## 2021-07-27 NOTE — ED Triage Notes (Signed)
Pt from home by EMS for seizures. EMS called by wife for shaking, pt unresponsive on arrival then combative, 5mg  IM versed given for agitation. Bilateral 18g IVs placed. 649ml NS given for Bp 90/40

## 2021-07-27 NOTE — Interval H&P Note (Signed)
Cath Lab Visit (complete for each Cath Lab visit)  Clinical Evaluation Leading to the Procedure:   ACS: Yes.   ( NSTEMI)  Non-ACS:  n/a     History and Physical Interval Note:  07/27/2021 2:15 PM  Danny [name]  has presented today for surgery, with the diagnosis of elevated trop, abnormal EKG.  The various methods of treatment have been discussed with the patient and family. After consideration of risks, benefits and other options for treatment, the patient has consented to  Procedure(s): LEFT HEART CATH AND CORONARY ANGIOGRAPHY (N/A) as a surgical intervention.  The patient's history has been reviewed, patient examined, no change in status, stable for surgery.  I have reviewed the patient's chart and labs.  Questions were answered to the patient's satisfaction.     Kathlyn [place]

## 2021-07-27 NOTE — Consult Note (Addendum)
[address]anny [name] [address]       [place] [address]             [phone]        Danny [name] [place] Medical Record #[id number] Date of Birth: 1934-04-20  Referring: No ref. provider found Primary Care: Danny Koch, [name] Primary Cardiologist:Danny Nahser, [name]  Chief Complaint:    Chief Complaint  Patient presents with   Code STEMI    History of Present Illness:    The patient is an 88 year old male who presented to the ER today as [name] code STEMI.  He has [name] history of hypertension, hyperlipidemia but no previous history of CAD.  He had [name] previous negative nuclear scan for ischemia and echocardiogram in March 2020 with normal LV function and mild aortic stenosis.  He has been doing well up until today when EMS was called as he became unresponsive with possible seizure-like activity.  The shaking lasted less than [name] minute but the unresponsiveness lasted longer.  Upon arrival of EMS he was confused and combative.  He was given Versed which calmed him.  He was initially noted also to be hypotensive and bradycardic.  ST segment elevation was noted on EKG and [name] code STEMI was called.  He had no chest pain and initial troponin was negative so it was canceled.  Increased high-sensitivity's troponin's continued and peak thus far has been 5799.  EKG in the ER did show inferior lead ischemic changes.  He has subsequently been taken to the Cath Lab where he was found to have severe three-vessel coronary artery disease with the culprit lesion probably RCA.  His EF is noted to be 40 to 45% with normal left ventricular end-diastolic pressures.  There was evidence of basal to mid inferior wall hypokinesis.  The patient does state that he has been under [name] lot of stress recently.  He has no previous history of seizures or CVA.    [name] CT scan of the head showed no hemorrhage.  An MRI of the brain showed no acute intracranial abnormality.  There was [name] chronic lacunar infarct in the left thalamus  and advanced cerebral white matter signal changes which are nonspecific but most likely also are small vessel disease related.  [name] CT angio of the head was negative for large vessel occlusion.  Please see full reports below.  Cardiology feels best revascularization option is surgical revascularization and we are asked to see the patient for consideration of CABG.  If deemed too high risk PCI is an option.  His mental status and neurological exam has normalized upon medical stabilization.  He does not recall the events leading up to the emergency room visit.  He has not had any recent illnesses.  He has not had any recent COVID or influenza infections.He did note he had some dark stool this am and has been found to be moderately anemic.  He is fairly active in general and says he walks [name] lot as well as lifts weights.  He also takes care of his wife who is ill and has significant physical impairments.    Current Activity/ Functional Status: Patient is independent with mobility/ambulation, transfers, ADL's, IADL's.   Zubrod Score: At the time of surgery this patients most appropriate activity status/level should be described as: [x]     0    Normal activity, no symptoms []     1    Restricted in physical strenuous activity but ambulatory, able  to do out light work []     2    Ambulatory and capable of self care, unable to do work activities, up and about                 more than 50%  Of the time                            []     3    Only limited self care, in bed greater than 50% of waking hours []     4    Completely disabled, no self care, confined to bed or chair []     5    Moribund  Past Medical History:  Diagnosis Date   Acid reflux    hiatal hernia   Diverticulitis    Erectile dysfunction 04/20/2015   Essential hypertension 04/20/2015   History of colon polyps    Hyperlipidemia 05/28/2015    Past Surgical History:  Procedure Laterality Date   APPENDECTOMY     cataract surgery       Social History   Tobacco Use  Smoking Status Former   Types: Cigars  Smokeless Tobacco Never    Social History   Substance and Sexual Activity  Alcohol Use No     Allergies  Allergen Reactions   Lipitor [Atorvastatin]     Muscle soreness    Current Facility-Administered Medications  Medication Dose Route Frequency Provider Last Rate Last Admin   0.9 %  sodium chloride infusion   Intravenous Continuous Danny [name], Danny Sessions, [name] 20 mL/hr at 07/27/21 0745 New Bag at 07/27/21 0745   [MAR Hold] acetaminophen (TYLENOL) tablet 650 mg  650 mg Oral Q4H PRN Danny [name]       [MAR Hold] aspirin EC tablet 81 mg  81 mg Oral Daily Danny [name]       Heparin (Porcine) in NaCl 1000-0.9 UT/500ML-% SOLN    PRN Danny [name]   500 mL at 07/27/21 1421   heparin ADULT infusion 100 units/mL (25000 units/253mL)  800 Units/hr Intravenous Continuous Danny [name] 8 mL/hr at 07/27/21 0758 800 Units/hr at 07/27/21 0758   heparin sodium (porcine) injection    PRN Danny [name]   4,000 Units at 07/27/21 1431   iohexol (OMNIPAQUE) 350 MG/ML injection    PRN Danny [name]   60 mL at 07/27/21 1454   lidocaine (PF) (XYLOCAINE) 1 % injection    PRN Danny [name]   2 mL at 07/27/21 1430   [MAR Hold] magnesium sulfate IVPB 2 g 50 mL  2 g Intravenous Once Danny [name]       [MAR Hold] nitroGLYCERIN (NITROSTAT) SL tablet 0.4 mg  0.4 mg Sublingual Q5 Min x 3 PRN Danny [name]       [MAR Hold] ondansetron Danny [name]) injection 4 mg  4 mg Intravenous Q6H PRN Danny [name]       Radial Cocktail/Verapamil only    PRN Danny [name]   Given at 07/27/21 1430   [MAR Hold] rosuvastatin (CRESTOR) tablet 20 mg  20 mg Oral Daily Danny [name]       [facility] Hold] sodium chloride flush (NS) 0.9 % injection 3 mL  3 mL Intravenous Q12H Danny [name]        Medications Prior to Admission  Medication Sig Dispense Refill Last  Dose   aspirin  EC 81 MG tablet Take 81 mg by mouth daily. Swallow whole.   07/26/2021   hydrochlorothiazide (HYDRODIURIL) 25 MG tablet Take 1 tablet by mouth once daily 90 tablet 3 07/26/2021   losartan (COZAAR) 100 MG tablet Take 1 tablet by mouth once daily 90 tablet 3 07/26/2021   potassium chloride (KLOR-CON) 10 MEQ tablet Take 1 tablet by mouth once daily 90 tablet 3 07/26/2021   triamcinolone ointment (KENALOG) 0.1 % Apply 1 application topically 2 (two) times daily.   07/26/2021   ezetimibe (ZETIA) 10 MG tablet Take 1 tablet (10 mg total) by mouth daily. (Patient not taking: Reported on 07/27/2021) 90 tablet 3 Not Taking   hyoscyamine (LEVSIN SL) 0.125 MG SL tablet Place 1 tablet (0.125 mg total) under the tongue every 8 (eight) hours as needed for cramping. (Patient not taking: Reported on 07/27/2021) 30 tablet 1 Not Taking    Family History  Problem Relation Age of Onset   Stroke Father 69   Sudden death Mother 15       unknown cause     Review of Systems:   Review of Systems  Constitutional:  Positive for malaise/fatigue and weight loss.  HENT: Negative.    Respiratory: Negative.    Cardiovascular: Negative.   Gastrointestinal:  Positive for melena. Negative for abdominal pain, blood in stool, constipation, diarrhea, nausea and vomiting.       + hiatal hernia and diverticulitis  Genitourinary:  Positive for frequency and urgency.       Previous TURP  Musculoskeletal: Negative.  Negative for falls.  Skin:        Multiple previous skin cancers  Neurological:  Positive for focal weakness, loss of consciousness and weakness. Negative for dizziness, tingling, tremors, sensory change and headaches.       Difficulty with left hand coordination and strength, for some time now Uncertain if todays event was [name] seizure  Endo/Heme/Allergies:  Negative for environmental allergies and polydipsia. Bruises/bleeds easily.  Psychiatric/Behavioral:  Negative for depression, hallucinations, memory loss, substance  abuse and suicidal ideas. The patient has insomnia. The patient is not nervous/anxious.          Physical Exam: BP (!) 94/49    Pulse 62    Temp (!) 96.5 F (35.8 C) (Axillary)    Resp 16    Ht 5\' 4"  (1.626 m)    Wt 68 kg    SpO2 98%    BMI 25.75 kg/m    General appearance: alert, cooperative, and no distress Head: Normocephalic, without obvious abnormality, atraumatic Neck: no adenopathy, no JVD, supple, symmetrical, trachea midline, thyroid not enlarged, symmetric, no tenderness/mass/nodules, and soft right transmitted murmur or bruit Lymph nodes: Cervical, supraclavicular, and axillary nodes normal. Resp: clear to auscultation bilaterally Back: symmetric, no curvature. ROM normal. No CVA tenderness. Cardio: regular rate and rhythm, S1, S2 normal, and soft systolic murmur  GI: soft, non-tender; bowel sounds normal; no masses,  no organomegaly Extremities: extremities normal, atraumatic, no cyanosis or edema Neurologic: Grossly normal except decreased strength and coordination with left hand DP/PT pulses absent   Diagnostic Studies & Laboratory data:     Recent Radiology Findings:   CT ANGIO HEAD NECK W WO CM  Result Date: 07/27/2021 CLINICAL DATA:  Initial evaluation for acute altered mental status. EXAM: CT ANGIOGRAPHY HEAD AND NECK TECHNIQUE: Multidetector CT imaging of the head and neck was performed using the standard protocol during bolus administration of intravenous contrast. Multiplanar CT image reconstructions  and MIPs were obtained to evaluate the vascular anatomy. Carotid stenosis measurements (when applicable) are obtained utilizing NASCET criteria, using the distal internal carotid diameter as the denominator. RADIATION DOSE REDUCTION: This exam was performed according to the departmental dose-optimization program which includes automated exposure control, adjustment of the mA and/or kV according to patient size and/or use of iterative reconstruction technique. CONTRAST:   31mL OMNIPAQUE IOHEXOL 350 MG/ML SOLN COMPARISON:  Head CT from earlier the same day. FINDINGS: CTA NECK FINDINGS Aortic arch: Visualized aortic arch normal caliber with normal branch pattern. Moderate atheromatous change about the arch itself. No hemodynamically significant stenosis about the origin the great vessels. Right carotid system: Right CCA patent from its origin to the bifurcation without stenosis. Scattered mixed plaque about the right carotid bulb/proximal right ICA without hemodynamically significant stenosis. Right ICA patent distally without stenosis or dissection. Left carotid system: Left CCA patent from its origin to the bifurcation without stenosis. Bulky calcified plaque about the left carotid bulb/proximal left ICA without hemodynamically significant stenosis. Left ICA patent distally without stenosis or dissection. Vertebral arteries: Both vertebral arteries arise from the subclavian arteries. Atheromatous change at the origin of the right vertebral artery with no more than mild stenosis. Vertebral arteries otherwise patent distally without stenosis or dissection. Skeleton: No discrete or worrisome osseous lesions. Mild-to-moderate spondylosis at C3-4 through C6-7. Patient is edentulous. Other neck: No other acute soft tissue abnormality within the neck. Upper chest: Visualized upper chest demonstrates no acute finding. Review of the MIP images confirms the above findings CTA HEAD FINDINGS Anterior circulation: Petrous segments patent bilaterally. Scattered atheromatous change within the carotid siphons without hemodynamically significant stenosis. A1 segments patent bilaterally. Normal anterior communicating artery complex. Anterior cerebral arteries patent without stenosis. No M1 stenosis or occlusion. Normal MCA bifurcations. Distal MCA branches well perfused and symmetric. Posterior circulation: Both V4 segments patent without stenosis. Both PICA origins patent and normal. Basilar patent  to its distal aspect without stenosis. Superior cerebellar arteries patent bilaterally. Both PCAs primarily supplied via the basilar. Atheromatous change throughout the PCAs bilaterally with associated moderate left P2 stenosis (series 12, image 25). PCAs remain patent to their distal aspects. Venous sinuses: Grossly patent allowing for timing the contrast bolus. Anatomic variants: None significant.  No aneurysm. Review of the MIP images confirms the above findings IMPRESSION: 1. Negative CTA for large vessel occlusion. 2. Moderate atheromatous change about the carotid bifurcations and carotid siphons without hemodynamically significant stenosis. 3. Moderate left P2 stenosis. Electronically Signed   By: Jeannine Boga M.D.   On: 07/27/2021 02:36   CT Head Wo Contrast  Result Date: 07/27/2021 CLINICAL DATA:  Mental status change, unknown cause EXAM: CT HEAD WITHOUT CONTRAST TECHNIQUE: Contiguous axial images were obtained from the base of the skull through the vertex without intravenous contrast. RADIATION DOSE REDUCTION: This exam was performed according to the departmental dose-optimization program which includes automated exposure control, adjustment of the mA and/or kV according to patient size and/or use of iterative reconstruction technique. COMPARISON:  None. BRAIN: BRAIN Cerebral ventricle sizes are concordant with the degree of cerebral volume loss. Patchy and confluent areas of decreased attenuation are noted throughout the deep and periventricular white matter of the cerebral hemispheres bilaterally, compatible with chronic microvascular ischemic disease. No evidence of large-territorial acute infarction. No parenchymal hemorrhage. No mass lesion. No extra-axial collection. No mass effect or midline shift. No hydrocephalus. Basilar cisterns are patent. Vascular: No hyperdense vessel. Atherosclerotic calcifications are present within the cavernous internal carotid and  vertebral arteries. Skull: No  acute fracture or focal lesion. Sinuses/Orbits: Paranasal sinuses and mastoid air cells are clear. Bilateral lens replacement. Otherwise the orbits are unremarkable. Other: None. IMPRESSION: No acute intracranial abnormality in [name] patient with diffuse atrophy and chronic microvascular ischemic changes. Electronically Signed   By: Iven Finn M.D.   On: 07/27/2021 01:40   MR BRAIN WO CONTRAST  Result Date: 07/27/2021 CLINICAL DATA:  88 year old male with unexplained altered mental status. EXAM: MRI HEAD WITHOUT CONTRAST TECHNIQUE: Multiplanar, multiecho pulse sequences of the brain and surrounding structures were obtained without intravenous contrast. COMPARISON:  CT head, CTA head and neck earlier today. FINDINGS: Brain: No restricted diffusion to suggest acute infarction. No midline shift, mass effect, evidence of mass lesion, ventriculomegaly, extra-axial collection or acute intracranial hemorrhage. Cervicomedullary junction and pituitary are within normal limits. Patchy and confluent widespread bilateral cerebral white matter T2 and FLAIR hyperintensity. No cortical encephalomalacia. No chronic cerebral blood products identified. Chronic lacunar infarct lateral left thalamus. But the other deep gray matter nuclei are within normal limits for age. Brainstem and cerebellum within normal limits for age. Vascular: Major intracranial vascular flow voids are preserved. Skull and upper cervical spine: Negative. Sinuses/Orbits: Postoperative changes to both globes. Otherwise negative orbits. Trace paranasal sinus mucosal thickening. Other: Mastoids are clear. Visible internal auditory structures appear normal. Scalp and face appear negative. IMPRESSION: 1. No acute intracranial abnormality. 2. 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 Electronically Signed   By: Genevie Ann M.D.   On:  07/27/2021 05:29   CARDIAC CATHETERIZATION  Result Date: 07/27/2021   Prox RCA lesion is 100% stenosed.   1st Mrg lesion is 90% stenosed.   Mid Cx to Dist Cx lesion is 90% stenosed.   1st Diag lesion is 95% stenosed.   2nd Diag-1 lesion is 80% stenosed.   2nd Diag-2 lesion is 85% stenosed.   Mid LAD lesion is 85% stenosed.   There is mild left ventricular systolic dysfunction.   LV end diastolic pressure is normal. 1.  Moderately calcified coronary arteries with severe three-vessel coronary artery disease.  The culprit for myocardial infarction is likely occluded proximal right coronary artery.  The onset of occlusion occlusion is likely more than 12 hours with no chest pain. 2.  Mildly reduced LV systolic function with an EF of 40 to 45% with normal left ventricular end-diastolic pressure.  There is evidence of basal to mid inferior wall hypokinesis. Recommendations: Given diffuse three-vessel coronary artery disease, I think the best option for revascularization is CABG. Resume heparin in 4 hours at 7 PM.   DG Chest Port 1 View  Result Date: 07/27/2021 CLINICAL DATA:  Chest pain. EXAM: PORTABLE CHEST 1 VIEW COMPARISON:  Chest radiograph dated 10/06/2005 FINDINGS: No focal consolidation, pleural effusion, or pneumothorax. Top-normal cardiac size. Atherosclerotic calcification of the aorta. No acute osseous pathology. IMPRESSION: No active disease. Electronically Signed   By: Anner Crete M.D.   On: 07/27/2021 00:59     I have independently reviewed the above radiologic studies and discussed with the patient   Recent Lab Findings: Lab Results  Component Value Date   WBC 5.4 07/27/2021   HGB 9.9 (L) 07/27/2021   HCT 29.0 (L) 07/27/2021   PLT 144 (L) 07/27/2021   GLUCOSE 114 (H) 07/27/2021   CHOL 174 07/27/2021   TRIG 57 07/27/2021   HDL 58 07/27/2021  [place] 105 (H) 07/27/2021   ALT 17 07/27/2021   AST 22 07/27/2021   NA 138 07/27/2021   K 3.8 07/27/2021   CL 105 07/27/2021    CREATININE 1.20 07/27/2021   BUN 26 (H) 07/27/2021   CO2 19 (L) 07/27/2021   TSH 3.190 07/23/2018   INR 1.0 07/27/2021   HGBA1C 5.5 07/27/2021      Assessment / Plan: Severe multivessel coronary artery disease with reduced left ventricular function in the setting of non-STEMI. GERD Diverticulitis Hypertension Hyperlipidemia History of colon polyps  The patient described melenic stools that he noticed this morning.  Is also noted to have an macrocytic anemia.  This could be [name] B12 or folate deficiency.  Previous iron studies done in January 2020 were in the normal range.  He has had some degree of anemia in the past.  With history of colon polyps may need to consider GI evaluation prior to proceeding with cardiac surgery.     Plan; surgeon to review patient and studies.  He would certainly benefit from CABG but age is somewhat of [name] risk factor in this setting.   I  spent 30 minutes counseling the patient face to face   John Giovanni, PA-C  07/27/2021 3:29 PM   Chart reviewed, patient examined, agree with above. This 88 year old gentleman was admitted with [name] non-ST segment elevation MI.  His initial troponin was negative but subsequent high-sensitivity troponin was [phone].  He never had any chest pain or shortness of breath but presented with [name] severe shaking episode that appeared to be of neurologic origin since he did not remember what happened.  MRI of the head showed no acute changes.  Cardiac catheterization shows severe three-vessel coronary disease with high-grade stenoses and every single-vessel.  Left ventricular systolic function appears normal.  2D echo is pending.  He does have [name] history of mild aortic stenosis by prior echo in 2020.  His mental status appears to be back to baseline.  The etiology of his symptoms at presentation are unclear but he certainly could have had an arrhythmic event with hypotension and seizure-like activity.  He denies any prior history of chest  pain, shortness of breath, or fatigue.  I agree that coronary bypass graft surgery is the best treatment for his coronary disease.  Despite being 88 years old he is in overall good condition and has been active up to this point.  He reported having some dark stools this morning and had [name] hemoglobin of 9.8 on admission.  His last hemoglobin in the computer records was 11.4 in January 2020.  This is probably another good reason not to have him on dual antiplatelet therapy. I discussed the operative procedure with the patient including alternatives, benefits and risks; including but not limited to bleeding, blood transfusion, infection, stroke, myocardial infarction, graft failure, heart block requiring [name] permanent pacemaker, organ dysfunction, and death.  Braydyn Schultes Mcavoy understands and agrees to proceed.  We will schedule surgery for Monday next week.  Gaye Pollack, [name]

## 2021-07-27 NOTE — Plan of Care (Addendum)
Arrived as field-activated CODE STEMI after becoming unconscious at home and exhibiting 30 seconds of possible seizure-like activity. Given Ativan 5mg  on their arrival due to agitation subsequently and sedated on arrival to the ED. He became hypotensive, and during that time ECG performed by EMS showed borderline inferior ST elevation with reciprocal changes. Hypotension improved with IVF and inferior ST segment changes subsequently resolved. Some residual nonspecific changes remain in aVL. Prior to this even he was in his normal state of health and has not experienced any chest pain. Initial troponin was negative, confirming suspicion that transient ECG changes do not represent true myocardial injury. ECGs and case review by Dr. Tamala Julian, interventional cardiology, who agrees. Would not treat as ACS for now. Can repeat troponin and consult if elevated or the patient begins to have chest pain. Evaluate for underlying etiology of presenting symptoms.

## 2021-07-27 NOTE — Progress Notes (Addendum)
[place] for heparin Indication: chest pain/ACS  Heparin Dosing Weight: 68 kg  Labs: Recent Labs    07/27/21 0025 07/27/21 0028 07/27/21 0225 07/27/21 0530  HGB 9.8* 9.9*  --   --   HCT 28.9* 29.0*  --   --   PLT 144*  --   --   --   APTT 26  --   --   --   LABPROT 13.5  --   --   --   INR 1.0  --   --   --   CREATININE 1.29* 1.20  --   --   TROPONINIHS 9  --  61* 993*    CrCl cannot be calculated (Unknown ideal weight.).  Assessment: 85 yom presenting with CP, elevated and increasing high-sensitivity troponin. Pharmacy consulted to dose heparin. Patient is not on anticoagulation PTA. Hg 9.9, plt 144. No active bleed issues documented.  Goal of Therapy:  Heparin level 0.3-0.7 units/ml Monitor platelets by anticoagulation protocol: Yes   Plan:  Heparin 3500 unit bolus Start heparin at 800 units/hr 6hr heparin level Monitor daily CBC, s/sx bleeding F/u Cardiology plans   Arturo Morton, PharmD, BCPS Please check AMION for all [place] contact numbers Clinical Pharmacist 07/27/2021 7:39 AM

## 2021-07-27 NOTE — ED Notes (Signed)
Assisted to bed pan to have BM. Denies chest pain. Denies shortness of breath. VSS

## 2021-07-27 NOTE — Consult Note (Addendum)
Cardiology Consultation:   Patient ID: Danny [name] MRN: [id number]; DOB: 19-Mar-1934  Admit date: 07/27/2021 Date of Consult: 07/27/2021  PCP:  Hoyt Koch, MD   [facility] Providers Cardiologist:  Mertie Moores, MD        Patient Profile:   Danny [name] is a 88 y.o. male with a hx of HTN, HLD, no hx of CAD  who is being seen 07/27/2021 for the evaluation of elevated troponins at the request of Dr. Myna Hidalgo.  History of Present Illness:   Danny [name] with no prior CAD and neg nuc for ischemia, and echo 09/2018 with normal LV function at 50-55% and mild AS.  Had been doing well.  Today presented to ER by EMS after becoming unresponsive and with shaking while sitting and EMS called.  His wife tells me it was a lot of shaking and he would not respond.  On EMS  arrival he was confused and combative.   EKG with ST elevation in III and Code STEMI was called but at that time neg troponin and no chest pain so was cancelled.   Now with increase of hs troponin to 5000.  Pt has been under a lot of stress, no hx of seizures. No recent infections.   Denies any blood in stools. Unaware of anemia.  Has not seen PCP in over 1 year.  He denis chest pain at anytime.  None leading up to event today.   Na 138, K+ 3.8 glucose 114, BUN 26 Cr 1.20 inoized ca+ 1.20 BNP 51 Hs troponin 9, 61, 993, 5,799 Tchol 174, HDL 58, LDL 105 TG 57  Hgb 9.8  Hct 28.9 plts 144 A1c 5.5 Flu neg.   EKG:  The EKG was personally reviewed and demonstrates:  SR at 63 with ST elevation inf leads.  Telemetry:  Telemetry was personally reviewed and demonstrates:  SR with PVC  CT of head no hemorrhage  MRI of brainwith no acute intracranial abnormality, Chronic lacunar infarct in the left thalamus, and advanced cerebral white matter signal changes which are nonspecific but most likely also small vessel disease related.  CT angio of head IMPRESSION: 1. Negative CTA for large vessel occlusion. 2. Moderate atheromatous  change about the carotid bifurcations and carotid siphons without hemodynamically significant stenosis. 3. Moderate left P2 stenosis.  PCXR no active disease.   BP 101/54 P 69 R 7 to 14 afebrile Resting comfortably no pain.     Past Medical History:  Diagnosis Date   Acid reflux    hiatal hernia   Diverticulitis    Erectile dysfunction 04/20/2015   Essential hypertension 04/20/2015   History of colon polyps    Hyperlipidemia 05/28/2015    Past Surgical History:  Procedure Laterality Date   APPENDECTOMY     cataract surgery       Home Medications:  Prior to Admission medications   Medication Sig Start Date End Date Taking? Authorizing Provider  aspirin EC 81 MG tablet Take 81 mg by mouth daily. Swallow whole.   Yes [provider]  hydrochlorothiazide (HYDRODIURIL) 25 MG tablet Take 1 tablet by mouth once daily 12/29/20  Yes Nahser, Wonda Cheng, MD  losartan (COZAAR) 100 MG tablet Take 1 tablet by mouth once daily 12/29/20  Yes Nahser, Wonda Cheng, MD  potassium chloride (KLOR-CON) 10 MEQ tablet Take 1 tablet by mouth once daily 12/29/20  Yes Nahser, Wonda Cheng, MD  triamcinolone ointment (KENALOG) 0.1 % Apply 1 application topically 2 (two) times daily.  Yes [provider]  ezetimibe (ZETIA) 10 MG tablet Take 1 tablet (10 mg total) by mouth daily. Patient not taking: Reported on 07/27/2021 10/27/20   Nahser, Wonda Cheng, MD  hyoscyamine (LEVSIN SL) 0.125 MG SL tablet Place 1 tablet (0.125 mg total) under the tongue every 8 (eight) hours as needed for cramping. Patient not taking: Reported on 07/27/2021 09/30/20   Loralie Champagne, PA-C    Inpatient Medications: Scheduled Meds:  [START ON 07/28/2021] aspirin EC  81 mg Oral Daily   Continuous Infusions:  sodium chloride 20 mL/hr at 07/27/21 0745   heparin 800 Units/hr (07/27/21 0758)   magnesium sulfate bolus IVPB     PRN Meds: acetaminophen, nitroGLYCERIN, ondansetron (ZOFRAN) IV  Allergies:    Allergies  Allergen  Reactions   Lipitor [Atorvastatin]     Muscle soreness    Social History:   Social History   Socioeconomic History   Marital status: Married    Spouse name: Not on file   Number of children: 1   Years of education: 12   Highest education level: Not on file  Occupational History   Occupation: Retired  Tobacco Use   Smoking status: Former    Types: Cigars   Smokeless tobacco: Never  Scientific laboratory technician Use: Never used  Substance and Sexual Activity   Alcohol use: No   Drug use: No   Sexual activity: Not on file  Other Topics Concern   Not on file  Social History Narrative   Lives with wife    Caffeine use: 1-2 cups coffee in the am and 1 glass tea qpm   Social Determinants of Health   Financial Resource Strain: Not on file  Food Insecurity: Not on file  Transportation Needs: Not on file  Physical Activity: Not on file  Stress: Not on file  Social Connections: Not on file  Intimate Partner Violence: Not on file    Family History:    Family History  Problem Relation Age of Onset   Stroke Father 60   Sudden death Mother 22       unknown cause     ROS:  Please see the history of present illness.  General:no colds or fevers, no weight changes Skin:no rashes or ulcers HEENT:no blurred vision, no congestion CV:see HPI PUL:see HPI GI:no diarrhea constipation or melena, no indigestion GU:no hematuria, no dysuria MS:no joint pain, no claudication Neuro:no syncope, no lightheadedness Endo:no diabetes, no thyroid disease  All other ROS reviewed and negative.     Physical Exam/Data:   Vitals:   07/27/21 0700 07/27/21 0800 07/27/21 0900 07/27/21 1000  BP: (!) 99/59 (!) 96/50 (!) 95/54 (!) 103/57  Pulse: 66 61 71 69  Resp: 14 16 12 14   Temp:      TempSrc:      SpO2: 97% 99% 93% 94%  Weight: 68 kg     Height: 5\' 4"  (1.626 m)       Intake/Output Summary (Last 24 hours) at 07/27/2021 1233 Last data filed at 07/27/2021 0700 Gross per 24 hour  Intake 965.7  ml  Output --  Net 965.7 ml   Last 3 Weights 07/27/2021 10/25/2020 09/30/2020  Weight (lbs) 150 lb 155 lb 9.6 oz 153 lb 4 oz  Weight (kg) 68.04 kg 70.58 kg 69.514 kg     Body mass index is 25.75 kg/m.  General:  Well nourished, well developed, in no acute distress HEENT: normal Neck: no JVD Vascular: No carotid bruits; Distal  pulses 2+ bilaterally Cardiac:  normal S1, S2; RRR; no murmur gallup rub or click  Lungs:  clear to auscultation bilaterally, no wheezing, rhonchi or rales  Abd: soft, nontender, no hepatomegaly  Ext: no edema Musculoskeletal:  No deformities, BUE and BLE strength normal and equal Skin: warm and dry  Neuro:  alert and oriented X 3  no focal abnormalities noted Psych:  Normal affect    Relevant CV Studies: TTE 09/24/18  IMPRESSIONS     1. The left ventricle has low normal systolic function, with an ejection  fraction of 50-55%. The cavity size was normal. Left ventricular diastolic  parameters were normal.   2. Distal septal hypokinesis.   3. The right ventricle has normal systolic function. The cavity was  normal. There is no increase in right ventricular wall thickness.   4. Left atrial size was mildly dilated.   5. Moderate thickening of the mitral valve leaflet. Moderate  calcification of the mitral valve leaflet. There is mild mitral annular  calcification present.   6. The aortic valve is tricuspid Moderate thickening of the aortic valve  Moderate calcification of the aortic valve. Aortic valve regurgitation is  mild by color flow Doppler.   7. Peak gradient 10 mmHg AVA 2.3 cm2.   FINDINGS   Left Ventricle: The left ventricle has low normal systolic function, with  an ejection fraction of 50-55%. The cavity size was normal. There is no  increase in left ventricular wall thickness. Left ventricular diastolic  parameters were normal. Distal  septal hypokinesis.  Right Ventricle: The right ventricle has normal systolic function. The  cavity was  normal. There is no increase in right ventricular wall  thickness.  Left Atrium: left atrial size was mildly dilated  Right Atrium: right atrial size was normal in size. Right atrial pressure  is estimated at 3 mmHg.  Interatrial Septum: No atrial level shunt detected by color flow Doppler.  Pericardium: There is no evidence of pericardial effusion.  Mitral Valve: The mitral valve is normal in structure. Moderate thickening  of the mitral valve leaflet. Moderate calcification of the mitral valve  leaflet. There is mild mitral annular calcification present. Mitral valve  regurgitation is trivial by color   flow Doppler.  Tricuspid Valve: The tricuspid valve is normal in structure. Tricuspid  valve regurgitation is mild by color flow Doppler.  Aortic Valve: The aortic valve is tricuspid Moderate thickening of the  aortic valve Moderate calcification of the aortic valve. Aortic valve  regurgitation is mild by color flow Doppler. There is no evidence of  aortic valve stenosis. Peak gradient 10 mmHg   AVA 2.3 cm2.  Pulmonic Valve: The pulmonic valve was grossly normal. Pulmonic valve  regurgitation is mild by color flow Doppler.  Venous: The inferior vena cava is normal in size with greater than 50%  respiratory variability.  Laboratory Data:  High Sensitivity Troponin:   Recent Labs  Lab 07/27/21 0025 07/27/21 0225 07/27/21 0530 07/27/21 0928  TROPONINIHS 9 61* 993* 5,799*     Chemistry Recent Labs  Lab 07/27/21 0025 07/27/21 0028 07/27/21 1004  NA 135 138  --   K 3.8 3.8  --   CL 107 105  --   CO2 19*  --   --   GLUCOSE 117* 114*  --   BUN 28* 26*  --   CREATININE 1.29* 1.20  --   CALCIUM 8.0*  --   --   MG  --   --  1.6*  GFRNONAA 54*  --   --   ANIONGAP 9  --   --     Recent Labs  Lab 07/27/21 0025  PROT 5.6*  ALBUMIN 3.2*  AST 22  ALT 17  ALKPHOS 50  BILITOT 0.3   Lipids  Recent Labs  Lab 07/27/21 0025  CHOL 174  TRIG 57  HDL 58  LDLCALC 105*   CHOLHDL 3.0    Hematology Recent Labs  Lab 07/27/21 0025 07/27/21 0028  WBC 5.4  --   RBC 2.87*  --   HGB 9.8* 9.9*  HCT 28.9* 29.0*  MCV 100.7*  --   MCH 34.1*  --   MCHC 33.9  --   RDW 13.6  --   PLT 144*  --    Thyroid No results for input(s): TSH, FREET4 in the last 168 hours.  BNP Recent Labs  Lab 07/27/21 0026  BNP 51.3    DDimer No results for input(s): DDIMER in the last 168 hours.   Radiology/Studies:  CT ANGIO HEAD NECK W WO CM  Result Date: 07/27/2021 CLINICAL DATA:  Initial evaluation for acute altered mental status. EXAM: CT ANGIOGRAPHY HEAD AND NECK TECHNIQUE: Multidetector CT imaging of the head and neck was performed using the standard protocol during bolus administration of intravenous contrast. Multiplanar CT image reconstructions and MIPs were obtained to evaluate the vascular anatomy. Carotid stenosis measurements (when applicable) are obtained utilizing NASCET criteria, using the distal internal carotid diameter as the denominator. RADIATION DOSE REDUCTION: This exam was performed according to the departmental dose-optimization program which includes automated exposure control, adjustment of the mA and/or kV according to patient size and/or use of iterative reconstruction technique. CONTRAST:  81mL OMNIPAQUE IOHEXOL 350 MG/ML SOLN COMPARISON:  Head CT from earlier the same day. FINDINGS: CTA NECK FINDINGS Aortic arch: Visualized aortic arch normal caliber with normal branch pattern. Moderate atheromatous change about the arch itself. No hemodynamically significant stenosis about the origin the great vessels. Right carotid system: Right CCA patent from its origin to the bifurcation without stenosis. Scattered mixed plaque about the right carotid bulb/proximal right ICA without hemodynamically significant stenosis. Right ICA patent distally without stenosis or dissection. Left carotid system: Left CCA patent from its origin to the bifurcation without stenosis. Bulky  calcified plaque about the left carotid bulb/proximal left ICA without hemodynamically significant stenosis. Left ICA patent distally without stenosis or dissection. Vertebral arteries: Both vertebral arteries arise from the subclavian arteries. Atheromatous change at the origin of the right vertebral artery with no more than mild stenosis. Vertebral arteries otherwise patent distally without stenosis or dissection. Skeleton: No discrete or worrisome osseous lesions. Mild-to-moderate spondylosis at C3-4 through C6-7. Patient is edentulous. Other neck: No other acute soft tissue abnormality within the neck. Upper chest: Visualized upper chest demonstrates no acute finding. Review of the MIP images confirms the above findings CTA HEAD FINDINGS Anterior circulation: Petrous segments patent bilaterally. Scattered atheromatous change within the carotid siphons without hemodynamically significant stenosis. A1 segments patent bilaterally. Normal anterior communicating artery complex. Anterior cerebral arteries patent without stenosis. No M1 stenosis or occlusion. Normal MCA bifurcations. Distal MCA branches well perfused and symmetric. Posterior circulation: Both V4 segments patent without stenosis. Both PICA origins patent and normal. Basilar patent to its distal aspect without stenosis. Superior cerebellar arteries patent bilaterally. Both PCAs primarily supplied via the basilar. Atheromatous change throughout the PCAs bilaterally with associated moderate left P2 stenosis (series 12, image 25). PCAs remain patent to their distal aspects.  Venous sinuses: Grossly patent allowing for timing the contrast bolus. Anatomic variants: None significant.  No aneurysm. Review of the MIP images confirms the above findings IMPRESSION: 1. Negative CTA for large vessel occlusion. 2. Moderate atheromatous change about the carotid bifurcations and carotid siphons without hemodynamically significant stenosis. 3. Moderate left P2 stenosis.  Electronically Signed   By: Jeannine Boga M.D.   On: 07/27/2021 02:36   CT Head Wo Contrast  Result Date: 07/27/2021 CLINICAL DATA:  Mental status change, unknown cause EXAM: CT HEAD WITHOUT CONTRAST TECHNIQUE: Contiguous axial images were obtained from the base of the skull through the vertex without intravenous contrast. RADIATION DOSE REDUCTION: This exam was performed according to the departmental dose-optimization program which includes automated exposure control, adjustment of the mA and/or kV according to patient size and/or use of iterative reconstruction technique. COMPARISON:  None. BRAIN: BRAIN Cerebral ventricle sizes are concordant with the degree of cerebral volume loss. Patchy and confluent areas of decreased attenuation are noted throughout the deep and periventricular white matter of the cerebral hemispheres bilaterally, compatible with chronic microvascular ischemic disease. No evidence of large-territorial acute infarction. No parenchymal hemorrhage. No mass lesion. No extra-axial collection. No mass effect or midline shift. No hydrocephalus. Basilar cisterns are patent. Vascular: No hyperdense vessel. Atherosclerotic calcifications are present within the cavernous internal carotid and vertebral arteries. Skull: No acute fracture or focal lesion. Sinuses/Orbits: Paranasal sinuses and mastoid air cells are clear. Bilateral lens replacement. Otherwise the orbits are unremarkable. Other: None. IMPRESSION: No acute intracranial abnormality in a patient with diffuse atrophy and chronic microvascular ischemic changes. Electronically Signed   By: Iven Finn M.D.   On: 07/27/2021 01:40   MR BRAIN WO CONTRAST  Result Date: 07/27/2021 CLINICAL DATA:  88 year old male with unexplained altered mental status. EXAM: MRI HEAD WITHOUT CONTRAST TECHNIQUE: Multiplanar, multiecho pulse sequences of the brain and surrounding structures were obtained without intravenous contrast. COMPARISON:  CT  head, CTA head and neck earlier today. FINDINGS: Brain: No restricted diffusion to suggest acute infarction. No midline shift, mass effect, evidence of mass lesion, ventriculomegaly, extra-axial collection or acute intracranial hemorrhage. Cervicomedullary junction and pituitary are within normal limits. Patchy and confluent widespread bilateral cerebral white matter T2 and FLAIR hyperintensity. No cortical encephalomalacia. No chronic cerebral blood products identified. Chronic lacunar infarct lateral left thalamus. But the other deep gray matter nuclei are within normal limits for age. Brainstem and cerebellum within normal limits for age. Vascular: Major intracranial vascular flow voids are preserved. Skull and upper cervical spine: Negative. Sinuses/Orbits: Postoperative changes to both globes. Otherwise negative orbits. Trace paranasal sinus mucosal thickening. Other: Mastoids are clear. Visible internal auditory structures appear normal. Scalp and face appear negative. IMPRESSION: 1. No acute intracranial abnormality. 2. 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 Electronically Signed   By: Genevie Ann M.D.   On: 07/27/2021 05:29   DG Chest Port 1 View  Result Date: 07/27/2021 CLINICAL DATA:  Chest pain. EXAM: PORTABLE CHEST 1 VIEW COMPARISON:  Chest radiograph dated 10/06/2005 FINDINGS: No focal consolidation, pleural effusion, or pneumothorax. Top-normal cardiac size. Atherosclerotic calcification of the aorta. No acute osseous pathology. IMPRESSION: No active disease. Electronically Signed   By: Anner Crete M.D.   On: 07/27/2021 00:59     Assessment and Plan:   NSTEMI on IV heparin.  Will need cardiac cath to eval with abnormal EKG as well.  Continue ASA -rec'd dose here possible BB though HR low at times. Will defer to MD  will check TSH as well. A1C is normal.  The  patient understands that risks included but are not limited to stroke (1 in 1000), death (1 in 1000), kidney failure [usually temporary] (1 in 500), bleeding (1 in 200), allergic reaction [possibly serious] (1 in 200).  Possible seizure vs syncope alone CT of head and MRI without acute issues - no CVA will need EEG and Neuro consult.  Wife notes he was shaking pretty hard with episode HTN on lower end of spectrum currently  holding HCTZ and home cozaar.  HLD on zetia alone did not tolerate lipitor, will add crestor for now, LDL 105 Anemia in 2020 hgb  was 11.4,  stool heme neg. Would recommend anemia panel.  No active bleeding per pt Hypomagnesium will give 2 gm IV    Risk Assessment/Risk Scores:     TIMI Risk Score for Unstable Angina or Non-ST Elevation MI:   The patient's TIMI risk score is 5, which indicates a 26% risk of all cause mortality, new or recurrent myocardial infarction or need for urgent revascularization in the next 14 days.          For questions or updates, please contact [place] Please consult [email or web address] for contact info under    Signed, Cecilie Kicks, NP  07/27/2021 12:33 PM  Danny [name] is an 88 year old delightful Caucasian male patient of Dr. Elmarie Shiley with history of hypertension hyperlipidemia.  He was admitted last night after a shaking episode that appeared to be a neurologic event although his MRI of his head was negative.  An EKG was performed that showed mild inferior ST segment elevation and a STEMI was initially activated but later called off.  Patient was asymptomatic.  He is had no chest pain.  His troponins however did go up to 5000.  He is on IV heparin.  His exam is benign.  Given his EKG changes and positive troponin (currently non-STEMI) I favor an invasive approach and have discussed coronary angiography with him which he agrees to. The patient understands that risks included but are not limited to stroke (1 in 1000), death (1 in 41), kidney  failure [usually temporary] (1 in 500), bleeding (1 in 200), allergic reaction [possibly serious] (1 in 200).  The patient understands and agrees to proceed     Agree with note written by Cecilie Kicks RNP Danny [name] 07/27/2021 1:35 PM

## 2021-07-27 NOTE — Progress Notes (Addendum)
ANTICOAGULATION CONSULT NOTE  Pharmacy Consult for heparin Indication: chest pain/ACS  Heparin Dosing Weight: 68 kg  Labs: Recent Labs    07/27/21 0025 07/27/21 0028 07/27/21 0225 07/27/21 0530 07/27/21 0928  HGB 9.8* 9.9*  --   --   --   HCT 28.9* 29.0*  --   --   --   PLT 144*  --   --   --   --   APTT 26  --   --   --   --   LABPROT 13.5  --   --   --   --   INR 1.0  --   --   --   --   CREATININE 1.29* 1.20  --   --   --   TROPONINIHS 9  --  61* 993* 5,799*     Estimated Creatinine Clearance: 37.7 mL/min (by C-G formula based on SCr of 1.2 mg/dL).  Assessment: 35 yom presenting with CP, elevated and increasing high-sensitivity troponin. Pharmacy consulted to dose heparin. Patient is not on anticoagulation PTA. Hg 9.9, plt 144.   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 Okay for pharmacy to restart heparin 4 hours after cath. No s/sx of bleeding.   Goal of Therapy:  Heparin level 0.3-0.7 units/ml Monitor platelets by anticoagulation protocol: Yes   Plan:  Restart heparin infusion at 800 units/hr at 1900 Order 8 hr heparin level after restart  Monitor daily CBC, s/sx bleeding F/u CABG evaluation  Antonietta Jewel, PharmD, [place] Clinical Pharmacist  Phone: [phone] 07/27/2021 4:57 PM  Please check AMION for all [place] phone numbers After 10:00 PM, call [place] [phone]

## 2021-07-28 ENCOUNTER — Encounter (HOSPITAL_COMMUNITY): Payer: Self-pay | Admitting: Cardiovascular Disease

## 2021-07-28 ENCOUNTER — Inpatient Hospital Stay (HOSPITAL_COMMUNITY): Payer: Medicare PPO

## 2021-07-28 DIAGNOSIS — D696 Thrombocytopenia, unspecified: Secondary | ICD-10-CM | POA: Clinically undetermined

## 2021-07-28 DIAGNOSIS — Z01810 Encounter for preprocedural cardiovascular examination: Secondary | ICD-10-CM | POA: Diagnosis not present

## 2021-07-28 DIAGNOSIS — N1831 Chronic kidney disease, stage 3a: Secondary | ICD-10-CM

## 2021-07-28 DIAGNOSIS — I214 Non-ST elevation (NSTEMI) myocardial infarction: Secondary | ICD-10-CM

## 2021-07-28 DIAGNOSIS — I249 Acute ischemic heart disease, unspecified: Secondary | ICD-10-CM

## 2021-07-28 DIAGNOSIS — D539 Nutritional anemia, unspecified: Secondary | ICD-10-CM | POA: Diagnosis present

## 2021-07-28 DIAGNOSIS — D509 Iron deficiency anemia, unspecified: Secondary | ICD-10-CM | POA: Diagnosis present

## 2021-07-28 DIAGNOSIS — D469 Myelodysplastic syndrome, unspecified: Secondary | ICD-10-CM | POA: Clinically undetermined

## 2021-07-28 LAB — ECHOCARDIOGRAM COMPLETE
AR max vel: 2.13 cm2
AV Area VTI: 2.2 cm2
AV Area mean vel: 2.14 cm2
AV Mean grad: 9 mmHg
AV Peak grad: 17.7 mmHg
Ao pk vel: 2.11 m/s
Area-P 1/2: 4.04 cm2
Height: 64 in
P 1/2 time: 697 msec
S' Lateral: 2.8 cm
Weight: 2440 oz

## 2021-07-28 LAB — BASIC METABOLIC PANEL
Anion gap: 7 (ref 5–15)
BUN: 28 mg/dL — ABNORMAL HIGH (ref 8–23)
CO2: 21 mmol/L — ABNORMAL LOW (ref 22–32)
Calcium: 8.5 mg/dL — ABNORMAL LOW (ref 8.9–10.3)
Chloride: 108 mmol/L (ref 98–111)
Creatinine, Ser: 1.3 mg/dL — ABNORMAL HIGH (ref 0.61–1.24)
GFR, Estimated: 54 mL/min — ABNORMAL LOW (ref >60)
Glucose, Bld: 115 mg/dL — ABNORMAL HIGH (ref 70–99)
Potassium: 4 mmol/L (ref 3.5–5.1)
Sodium: 136 mmol/L (ref 135–145)

## 2021-07-28 LAB — CBC
HCT: 27.5 % — ABNORMAL LOW (ref 39.0–52.0)
Hemoglobin: 9.3 g/dL — ABNORMAL LOW (ref 13.0–17.0)
MCH: 34.2 pg — ABNORMAL HIGH (ref 26.0–34.0)
MCHC: 33.8 g/dL (ref 30.0–36.0)
MCV: 101.1 fL — ABNORMAL HIGH (ref 80.0–100.0)
Platelets: 121 10*3/uL — ABNORMAL LOW (ref 150–400)
RBC: 2.72 MIL/uL — ABNORMAL LOW (ref 4.22–5.81)
RDW: 14 % (ref 11.5–15.5)
WBC: 7.1 10*3/uL (ref 4.0–10.5)
nRBC: 0.3 % — ABNORMAL HIGH (ref 0.0–0.2)

## 2021-07-28 LAB — LIPID PANEL
Cholesterol: 162 mg/dL (ref 0–200)
HDL: 57 mg/dL (ref >40)
LDL Cholesterol: 94 mg/dL (ref 0–99)
Total CHOL/HDL Ratio: 2.8 RATIO
Triglycerides: 54 mg/dL (ref <150)
VLDL: 11 mg/dL (ref 0–40)

## 2021-07-28 LAB — HEPARIN LEVEL (UNFRACTIONATED)
Heparin Unfractionated: 0.28 IU/mL — ABNORMAL LOW (ref 0.30–0.70)
Heparin Unfractionated: 0.38 IU/mL (ref 0.30–0.70)

## 2021-07-28 LAB — GLUCOSE, CAPILLARY: Glucose-Capillary: 162 mg/dL — ABNORMAL HIGH (ref 70–99)

## 2021-07-28 MED ORDER — VITAMIN B-12 1000 MCG PO TABS
1000.0000 ug | ORAL_TABLET | Freq: Every day | ORAL | Status: DC
  Administered 2021-07-28 – 2021-07-31 (×4): 1000 ug via ORAL
  Filled 2021-07-28 (×4): qty 1

## 2021-07-28 NOTE — Progress Notes (Signed)
CARDIAC REHAB PHASE I   PRE:  Rate/Rhythm: 46 SR  BP:  Supine:   Sitting: 104/57  Standing:    SaO2:   MODE:  Ambulation: 340 ft   POST:  Rate/Rhythm: 82 SR  BP:  Supine:   Sitting: 141/64  Standing:    SaO2:  Per-operative education completed in preparation for CABG 08/07/2021.  Patient able to demonstrate IS usage able to reach 1041ml.  Also completed sternal precautions and "staying in the tube".  Able to demonstrate getting OOB and chair demonstrating not using his arms to push or pull.  Ambulated with minimal assistance x 1, he is slightly unsteady when first getting up and used the IV pump to balance the first few steps.  Encouraged to ambulate 3 times daily with staff prior to CABG to prevent deconditioning.   1010-1100 Liliane Channel RN, BSN 07/28/2021 10:51 AM

## 2021-07-28 NOTE — Progress Notes (Signed)
ANTICOAGULATION CONSULT NOTE  Pharmacy Consult for heparin Indication: chest pain/ACS  Heparin Dosing Weight: 68 kg  Labs: Recent Labs    07/27/21 0025 07/27/21 0028 07/27/21 0225 07/27/21 0530 07/27/21 0928 07/28/21 0301 07/28/21 1219  HGB 9.8* 9.9*  --   --   --  9.3*  --   HCT 28.9* 29.0*  --   --   --  27.5*  --   PLT 144*  --   --   --   --  121*  --   APTT 26  --   --   --   --   --   --   LABPROT 13.5  --   --   --   --   --   --   INR 1.0  --   --   --   --   --   --   HEPARINUNFRC  --   --   --   --   --  0.28* 0.38  CREATININE 1.29* 1.20  --   --   --  1.30*  --   TROPONINIHS 9  --  61* 993* 5,799*  --   --     Estimated Creatinine Clearance: 34.8 mL/min (A) (by C-G formula based on SCr of 1.3 mg/dL (H)).  Assessment: 63 yom presenting with CP, elevated and increasing high-sensitivity troponin. Pharmacy consulted to dose heparin. Patient is not on anticoagulation PTA.   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 Restarted IV heparin post cath. Currently on heparin infusion at 850 units/hr. Heparin level is therapeutic at 0.38. No s/s of bleeding noted per RN.  Goal of Therapy:  Heparin level 0.3-0.7 units/ml Monitor platelets by anticoagulation protocol: Yes   Plan:  Continue heparin infusion at 850 units/hr  Monitor daily CBC, s/sx bleeding  Erin Hearing PharmD., BCPS Clinical Pharmacist 07/28/2021 1:25 PM

## 2021-07-28 NOTE — Assessment & Plan Note (Addendum)
Initially there was concern for STEMI.  Cardiology was consulted.  Not thought to be a STEMI.  However patient did have significant rise in troponin.  Subsequently thought to be a non-STEMI.   Underwent cardiac catheterization which showed triple-vessel disease. Cardiothoracic surgery was consulted.  Plan is for CABG on Monday.   Patient is on heparin infusion.  Patient is on aspirin and statin.   Defer further cardiac medications to cardiology who is also following.  Has not been placed on beta-blockers or ACE inhibitor due to low blood pressures. LDL noted to be 94.   Echocardiogram shows normal systolic function without regional wall motion abnormalities. Cardiac status is stable.  Denies any chest pain

## 2021-07-28 NOTE — Progress Notes (Signed)
Echocardiogram 2D Echocardiogram has been performed.  Jefferey Pica 07/28/2021, 10:24 AM

## 2021-07-28 NOTE — Progress Notes (Addendum)
[facility] PROGRESS NOTE   Danny [name] WGN:[id number] DOB: Oct 19, 1933 DOA: 07/27/2021  1 DOS: the patient was seen and examined on 07/28/2021  PCP: Hoyt Koch, MD  Brief History and Hospital Course:  88 y.o. male with medical history significant of hypertension comes in after being found unresponsive and shaking by his wife.  Patient came in via EMS and was called in as a code STEMI.  Patient was noted to be hypotensive with systolics in the 78I and 69G and bradycardic.  He became combative in the ambulance was given Versed and IV fluids.  Subsequently seen by cardiology.  Code STEMI was canceled.  Repeat troponin was noted to be significantly elevated.  Underwent cardiac catheterization which revealed triple-vessel disease.  Plan is for CABG on Monday.   Consultants: Cardiology.  Cardiothoracic surgery  Procedures:  Cardiac catheterization.   Transthoracic echocardiogram.    Subjective: Patient denies any chest pain shortness of breath this morning.  Anxious about his upcoming bypass surgery.    Assessment/Plan:  * Seizure-like activity [facility]) Patient apparently had some kind of shaking episode in the morning.  No focal neurological deficits noted.  MRI did not show any acute stroke.  Symptomatology not consistent with seizure activity.  Could have had perhaps a syncopal or near syncopal episode since he mentioned that it happened once he got out of bed.  Could have had a orthostatic drop in his blood pressure. Monitor for recurrence.  Seems to be stable currently.  NSTEMI (non-ST elevated myocardial infarction) ([place])- (present on admission) Initially there was concern for STEMI.  Cardiology was consulted.  Not thought to be a STEMI.  However patient did have significant rise in troponin.  Subsequently thought to be a non-STEMI.  Underwent cardiac catheterization which showed triple-vessel disease. Cardiothoracic surgery was consulted.  Plan is for CABG on  Monday.  Patient is on heparin infusion.  Patient is on aspirin and statin.  Defer further cardiac medications to cardiology who is also following.  Patient remains chest pain-free currently. LDL noted to be 94.   Echocardiogram has been ordered for today.  Essential hypertension- (present on admission) Blood pressure is borderline low.  Continue to monitor.  He remains asymptomatic.  Noted to be on HCTZ and losartan at home which is currently on hold.  CKD (chronic kidney disease) stage 3, GFR 30-59 ml/min (HCC)- (present on admission) Renal function close to baseline.  Avoid nephrotoxic agents.  Monitor urine output.  Macrocytic anemia- (present on admission) Hemoglobin noted to be low.  Stool for occult blood was negative. Anemia panel does not show any clear-cut deficiency but B12 level is noted to be on the low side.  Start supplementing.  No other reason for anemia is determined at this time.  TSH is normal.  Thrombocytopenia (HCC) Continue to monitor while on heparin.       DVT Prophylaxis: On IV heparin Code Status: Full code Family Communication: Discussed with the patient Disposition Plan: To be determined  Status is: Inpatient  Remains inpatient appropriate because: NSTEMI.  Need for bypass surgery         Medications: Scheduled:  aspirin EC  81 mg Oral Daily   rosuvastatin  20 mg Oral Daily   sodium chloride flush  3 mL Intravenous Q12H   Continuous:  sodium chloride 20 mL/hr at 07/27/21 1304   sodium chloride     heparin 850 Units/hr (07/28/21 0440)   EXB:MWUXLK chloride, acetaminophen, nitroGLYCERIN, ondansetron (ZOFRAN) IV, sodium chloride  flush  Antibiotics: Anti-infectives (From admission, onward)    None       Objective:  Vital Signs  Vitals:   07/27/21 1929 07/28/21 0118 07/28/21 0433 07/28/21 0438  BP: (!) 107/57 (!) 104/57  (!) 103/58  Pulse: 67 67  65  Resp: 16 16  16   Temp: 98.7 F (37.1 C) 98.5 F (36.9 C)  98.3 F (36.8 C)   TempSrc: Oral Oral  Oral  SpO2: 97% 99%  97%  Weight:   69.2 kg   Height:        Intake/Output Summary (Last 24 hours) at 07/28/2021 0942 Last data filed at 07/28/2021 0422 Gross per 24 hour  Intake 280.67 ml  Output --  Net 280.67 ml   Filed Weights   07/27/21 0700 07/28/21 0433  Weight: 68 kg 69.2 kg    General appearance: Awake alert.  In no distress Resp: Clear to auscultation bilaterally.  Normal effort Cardio: S1-S2 is normal regular.  No S3-S4.  No rubs murmurs or bruit GI: Abdomen is soft.  Nontender nondistended.  Bowel sounds are present normal.  No masses organomegaly Extremities: No edema.  Full range of motion of lower extremities. Neurologic: Alert and oriented x3.  No focal neurological deficits.    Lab Results:  Data Reviewed: I have personally reviewed labs and imaging study reports  CBC: Recent Labs  Lab 07/27/21 0025 07/27/21 0028 07/28/21 0301  WBC 5.4  --  7.1  NEUTROABS 3.0  --   --   HGB 9.8* 9.9* 9.3*  HCT 28.9* 29.0* 27.5*  MCV 100.7*  --  101.1*  PLT 144*  --  121*    Basic Metabolic Panel: Recent Labs  Lab 07/27/21 0025 07/27/21 0028 07/27/21 1004 07/28/21 0301  NA 135 138  --  136  K 3.8 3.8  --  4.0  CL 107 105  --  108  CO2 19*  --   --  21*  GLUCOSE 117* 114*  --  115*  BUN 28* 26*  --  28*  CREATININE 1.29* 1.20  --  1.30*  CALCIUM 8.0*  --   --  8.5*  MG  --   --  1.6*  --     GFR: Estimated Creatinine Clearance: 34.8 mL/min (A) (by C-G formula based on SCr of 1.3 mg/dL (H)).  Liver Function Tests: Recent Labs  Lab 07/27/21 0025  AST 22  ALT 17  ALKPHOS 50  BILITOT 0.3  PROT 5.6*  ALBUMIN 3.2*     Coagulation Profile: Recent Labs  Lab 07/27/21 0025  INR 1.0     HbA1C: Recent Labs    07/27/21 0025  HGBA1C 5.5      Lipid Profile: Recent Labs    07/27/21 0025 07/28/21 0301  CHOL 174 162  HDL 58 57  LDLCALC 105* 94  TRIG 57 54  CHOLHDL 3.0 2.8    Thyroid Function Tests: Recent Labs     07/27/21 1824  TSH 1.029    Anemia Panel: Recent Labs    07/27/21 1824  VITAMINB12 201  FOLATE 16.9  FERRITIN 135  TIBC 287  IRON 109  RETICCTPCT 1.3    Recent Results (from the past 240 hour(s))  Resp Panel by RT-PCR (Flu A&B, Covid) Nasopharyngeal Swab     Status: None   Collection Time: 07/27/21 12:25 AM   Specimen: Nasopharyngeal Swab; Nasopharyngeal(NP) swabs in vial transport medium  Result Value Ref Range Status   SARS Coronavirus 2 by RT PCR  NEGATIVE NEGATIVE Final    Comment: (NOTE) SARS-CoV-2 target nucleic acids are NOT DETECTED.  The SARS-CoV-2 RNA is generally detectable in upper respiratory specimens during the acute phase of infection. The lowest concentration of SARS-CoV-2 viral copies this assay can detect is 138 copies/mL. A negative result does not preclude SARS-Cov-2 infection and should not be used as the sole basis for treatment or other patient management decisions. A negative result may occur with  improper specimen collection/handling, submission of specimen other than nasopharyngeal swab, presence of viral mutation(s) within the areas targeted by this assay, and inadequate number of viral copies(<138 copies/mL). A negative result must be combined with clinical observations, patient history, and epidemiological information. The expected result is Negative.  Fact Sheet for Patients:  EntrepreneurPulse.com.au  Fact Sheet for Healthcare Providers:  IncredibleEmployment.be  This test is no t yet approved or cleared by the [place] FDA and  has been authorized for detection and/or diagnosis of SARS-CoV-2 by FDA under an Emergency Use Authorization (EUA). This EUA will remain  in effect (meaning this test can be used) for the duration of the COVID-19 declaration under Section 564(b)(1) of the Act, 21 U.S.C.section 360bbb-3(b)(1), unless the authorization is terminated  or revoked sooner.        Influenza A by PCR NEGATIVE NEGATIVE Final   Influenza B by PCR NEGATIVE NEGATIVE Final    Comment: (NOTE) The Xpert Xpress SARS-CoV-2/FLU/RSV plus assay is intended as an aid in the diagnosis of influenza from Nasopharyngeal swab specimens and should not be used as a sole basis for treatment. Nasal washings and aspirates are unacceptable for Xpert Xpress SARS-CoV-2/FLU/RSV testing.  Fact Sheet for Patients: EntrepreneurPulse.com.au  Fact Sheet for Healthcare Providers: IncredibleEmployment.be  This test is not yet approved or cleared by the [place] FDA and has been authorized for detection and/or diagnosis of SARS-CoV-2 by FDA under an Emergency Use Authorization (EUA). This EUA will remain in effect (meaning this test can be used) for the duration of the COVID-19 declaration under Section 564(b)(1) of the Act, 21 U.S.C. section 360bbb-3(b)(1), unless the authorization is terminated or revoked.  Performed at [facility], [place] [address], [place], [place] [address]       Radiology Studies: CT ANGIO HEAD NECK W WO CM  Result Date: 07/27/2021 CLINICAL DATA:  Initial evaluation for acute altered mental status. EXAM: CT ANGIOGRAPHY HEAD AND NECK TECHNIQUE: Multidetector CT imaging of the head and neck was performed using the standard protocol during bolus administration of intravenous contrast. Multiplanar CT image reconstructions and MIPs were obtained to evaluate the vascular anatomy. Carotid stenosis measurements (when applicable) are obtained utilizing NASCET criteria, using the distal internal carotid diameter as the denominator. RADIATION DOSE REDUCTION: This exam was performed according to the departmental dose-optimization program which includes automated exposure control, adjustment of the mA and/or kV according to patient size and/or use of iterative reconstruction technique. CONTRAST:  56mL OMNIPAQUE IOHEXOL 350 MG/ML SOLN  COMPARISON:  Head CT from earlier the same day. FINDINGS: CTA NECK FINDINGS Aortic arch: Visualized aortic arch normal caliber with normal branch pattern. Moderate atheromatous change about the arch itself. No hemodynamically significant stenosis about the origin the great vessels. Right carotid system: Right CCA patent from its origin to the bifurcation without stenosis. Scattered mixed plaque about the right carotid bulb/proximal right ICA without hemodynamically significant stenosis. Right ICA patent distally without stenosis or dissection. Left carotid system: Left CCA patent from its origin to the bifurcation without stenosis. Bulky calcified  plaque about the left carotid bulb/proximal left ICA without hemodynamically significant stenosis. Left ICA patent distally without stenosis or dissection. Vertebral arteries: Both vertebral arteries arise from the subclavian arteries. Atheromatous change at the origin of the right vertebral artery with no more than mild stenosis. Vertebral arteries otherwise patent distally without stenosis or dissection. Skeleton: No discrete or worrisome osseous lesions. Mild-to-moderate spondylosis at C3-4 through C6-7. Patient is edentulous. Other neck: No other acute soft tissue abnormality within the neck. Upper chest: Visualized upper chest demonstrates no acute finding. Review of the MIP images confirms the above findings CTA HEAD FINDINGS Anterior circulation: Petrous segments patent bilaterally. Scattered atheromatous change within the carotid siphons without hemodynamically significant stenosis. A1 segments patent bilaterally. Normal anterior communicating artery complex. Anterior cerebral arteries patent without stenosis. No M1 stenosis or occlusion. Normal MCA bifurcations. Distal MCA branches well perfused and symmetric. Posterior circulation: Both V4 segments patent without stenosis. Both PICA origins patent and normal. Basilar patent to its distal aspect without stenosis.  Superior cerebellar arteries patent bilaterally. Both PCAs primarily supplied via the basilar. Atheromatous change throughout the PCAs bilaterally with associated moderate left P2 stenosis (series 12, image 25). PCAs remain patent to their distal aspects. Venous sinuses: Grossly patent allowing for timing the contrast bolus. Anatomic variants: None significant.  No aneurysm. Review of the MIP images confirms the above findings IMPRESSION: 1. Negative CTA for large vessel occlusion. 2. Moderate atheromatous change about the carotid bifurcations and carotid siphons without hemodynamically significant stenosis. 3. Moderate left P2 stenosis. Electronically Signed   By: Jeannine Boga M.D.   On: 07/27/2021 02:36   CT Head Wo Contrast  Result Date: 07/27/2021 CLINICAL DATA:  Mental status change, unknown cause EXAM: CT HEAD WITHOUT CONTRAST TECHNIQUE: Contiguous axial images were obtained from the base of the skull through the vertex without intravenous contrast. RADIATION DOSE REDUCTION: This exam was performed according to the departmental dose-optimization program which includes automated exposure control, adjustment of the mA and/or kV according to patient size and/or use of iterative reconstruction technique. COMPARISON:  None. BRAIN: BRAIN Cerebral ventricle sizes are concordant with the degree of cerebral volume loss. Patchy and confluent areas of decreased attenuation are noted throughout the deep and periventricular white matter of the cerebral hemispheres bilaterally, compatible with chronic microvascular ischemic disease. No evidence of large-territorial acute infarction. No parenchymal hemorrhage. No mass lesion. No extra-axial collection. No mass effect or midline shift. No hydrocephalus. Basilar cisterns are patent. Vascular: No hyperdense vessel. Atherosclerotic calcifications are present within the cavernous internal carotid and vertebral arteries. Skull: No acute fracture or focal lesion.  Sinuses/Orbits: Paranasal sinuses and mastoid air cells are clear. Bilateral lens replacement. Otherwise the orbits are unremarkable. Other: None. IMPRESSION: No acute intracranial abnormality in a patient with diffuse atrophy and chronic microvascular ischemic changes. Electronically Signed   By: Iven Finn M.D.   On: 07/27/2021 01:40   MR BRAIN WO CONTRAST  Result Date: 07/27/2021 CLINICAL DATA:  88 year old male with unexplained altered mental status. EXAM: MRI HEAD WITHOUT CONTRAST TECHNIQUE: Multiplanar, multiecho pulse sequences of the brain and surrounding structures were obtained without intravenous contrast. COMPARISON:  CT head, CTA head and neck earlier today. FINDINGS: Brain: No restricted diffusion to suggest acute infarction. No midline shift, mass effect, evidence of mass lesion, ventriculomegaly, extra-axial collection or acute intracranial hemorrhage. Cervicomedullary junction and pituitary are within normal limits. Patchy and confluent widespread bilateral cerebral white matter T2 and FLAIR hyperintensity. No cortical encephalomalacia. No chronic cerebral blood products identified.  Chronic lacunar infarct lateral left thalamus. But the other deep gray matter nuclei are within normal limits for age. Brainstem and cerebellum within normal limits for age. Vascular: Major intracranial vascular flow voids are preserved. Skull and upper cervical spine: Negative. Sinuses/Orbits: Postoperative changes to both globes. Otherwise negative orbits. Trace paranasal sinus mucosal thickening. Other: Mastoids are clear. Visible internal auditory structures appear normal. Scalp and face appear negative. IMPRESSION: 1. No acute intracranial abnormality. 2. 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 Electronically Signed   By: Genevie Ann M.D.   On: 07/27/2021 05:29   CARDIAC  CATHETERIZATION  Addendum Date: 07/27/2021     Prox RCA lesion is 100% stenosed.   1st Mrg lesion is 90% stenosed.   Mid Cx to Dist Cx lesion is 90% stenosed.   1st Diag lesion is 95% stenosed.   2nd Diag-1 lesion is 80% stenosed.   2nd Diag-2 lesion is 85% stenosed.   Mid LAD lesion is 85% stenosed.   There is mild left ventricular systolic dysfunction.   LV end diastolic pressure is normal. 1.  Moderately calcified coronary arteries with severe three-vessel coronary artery disease.  The culprit for myocardial infarction is likely occluded proximal right coronary artery.  The onset of occlusion occlusion is likely more than 12 hours with no chest pain. 2.  Mildly reduced LV systolic function with an EF of 40 to 45% with normal left ventricular end-diastolic pressure.  There is evidence of basal to mid inferior wall hypokinesis. Recommendations: Given diffuse three-vessel coronary artery disease, I think the best option for revascularization is CABG. Resume heparin in 4 hours at 7 PM. Multivessel PCI to the LAD, OM1 and left circumflex is also possible if the patient is deemed to be too high risk for CABG.  Result Date: 07/27/2021   Prox RCA lesion is 100% stenosed.   1st Mrg lesion is 90% stenosed.   Mid Cx to Dist Cx lesion is 90% stenosed.   1st Diag lesion is 95% stenosed.   2nd Diag-1 lesion is 80% stenosed.   2nd Diag-2 lesion is 85% stenosed.   Mid LAD lesion is 85% stenosed.   There is mild left ventricular systolic dysfunction.   LV end diastolic pressure is normal. 1.  Moderately calcified coronary arteries with severe three-vessel coronary artery disease.  The culprit for myocardial infarction is likely occluded proximal right coronary artery.  The onset of occlusion occlusion is likely more than 12 hours with no chest pain. 2.  Mildly reduced LV systolic function with an EF of 40 to 45% with normal left ventricular end-diastolic pressure.  There is evidence of basal to mid inferior wall hypokinesis.  Recommendations: Given diffuse three-vessel coronary artery disease, I think the best option for revascularization is CABG. Resume heparin in 4 hours at 7 PM.   DG Chest Port 1 View  Result Date: 07/27/2021 CLINICAL DATA:  Chest pain. EXAM: PORTABLE CHEST 1 VIEW COMPARISON:  Chest radiograph dated 10/06/2005 FINDINGS: No focal consolidation, pleural effusion, or pneumothorax. Top-normal cardiac size. Atherosclerotic calcification of the aorta. No acute osseous pathology. IMPRESSION: No active disease. Electronically Signed   By: Anner Crete M.D.   On: 07/27/2021 00:59       LOS: 1 day   [facility] Pager on [email or web address]  07/28/2021, 9:42 AM

## 2021-07-28 NOTE — Assessment & Plan Note (Signed)
Hemoglobin noted to be low.  Stool for occult blood was negative. Anemia panel does not show any clear-cut deficiency but B12 level is noted to be on the low side.  Start supplementing.  No other reason for anemia is determined at this time.  TSH is normal.

## 2021-07-28 NOTE — Assessment & Plan Note (Addendum)
Continue to monitor while on heparin.  Counts are stable.

## 2021-07-28 NOTE — Assessment & Plan Note (Addendum)
Hemoglobin noted to be low.  Stool for occult blood was negative. Anemia panel does not show any clear-cut deficiency but B12 level is noted to be on the low side.  Started on B12 supplementation.   No other reason for anemia is determined at this time.  TSH is normal. Recommend following CBC in the outpatient setting.  May benefit from referral to hematology at some point in the future. Hemoglobin noted to be slightly lower today compared to yesterday.  No overt bleeding has been noted.  Likely he will need blood transfusion perioperatively.  Will defer this to the surgeons.  Monitor for signs of bleeding.

## 2021-07-28 NOTE — Progress Notes (Signed)
Pre-CABG study completed.  ° °Please see CV Proc for preliminary results.  ° °Jemar Paulsen, RDMS, RVT ° °

## 2021-07-28 NOTE — Assessment & Plan Note (Addendum)
Blood pressure has been borderline low.  Holding his antihypertensives.   Was on HCTZ and losartan prior to admission.

## 2021-07-28 NOTE — Progress Notes (Signed)
Progress Note  Patient Name: Danny [name] Date of Encounter: 07/28/2021  [facility] Cardiologist: Mertie Moores, MD   Subjective   Postop day #1 cardiac catheterization revealing three-vessel disease requiring CABG.  He is pain-free on IV heparin this morning.  Inpatient Medications    Scheduled Meds:  aspirin EC  81 mg Oral Daily   rosuvastatin  20 mg Oral Daily   sodium chloride flush  3 mL Intravenous Q12H   Continuous Infusions:  sodium chloride 20 mL/hr at 07/27/21 1304   sodium chloride     heparin 850 Units/hr (07/28/21 0440)   PRN Meds: sodium chloride, acetaminophen, nitroGLYCERIN, ondansetron (ZOFRAN) IV, sodium chloride flush   Vital Signs    Vitals:   07/27/21 1929 07/28/21 0118 07/28/21 0433 07/28/21 0438  BP: (!) 107/57 (!) 104/57  (!) 103/58  Pulse: 67 67  65  Resp: 16 16  16   Temp: 98.7 F (37.1 C) 98.5 F (36.9 C)  98.3 F (36.8 C)  TempSrc: Oral Oral  Oral  SpO2: 97% 99%  97%  Weight:   69.2 kg   Height:        Intake/Output Summary (Last 24 hours) at 07/28/2021 0857 Last data filed at 07/28/2021 0422 Gross per 24 hour  Intake 280.67 ml  Output --  Net 280.67 ml   Last 3 Weights 07/28/2021 07/27/2021 10/25/2020  Weight (lbs) 152 lb 8 oz 150 lb 155 lb 9.6 oz  Weight (kg) 69.174 kg 68.04 kg 70.58 kg      Telemetry    Normal sinus rhythm- Personally Reviewed  ECG    Normal sinus rhythm at 67 with septal Q waves and subtle inferior ST segment elevation.- Personally Reviewed  Physical Exam   GEN: No acute distress.   Neck: No JVD Cardiac: RRR, no murmurs, rubs, or gallops.  Respiratory: Clear to auscultation bilaterally. GI: Soft, nontender, non-distended  MS: No edema; No deformity. Neuro:  Nonfocal  Psych: Normal affect   Labs    High Sensitivity Troponin:   Recent Labs  Lab 07/27/21 0025 07/27/21 0225 07/27/21 0530 07/27/21 0928  TROPONINIHS 9 61* 993* 5,799*     Chemistry Recent Labs  Lab 07/27/21 0025  07/27/21 0028 07/27/21 1004 07/28/21 0301  NA 135 138  --  136  K 3.8 3.8  --  4.0  CL 107 105  --  108  CO2 19*  --   --  21*  GLUCOSE 117* 114*  --  115*  BUN 28* 26*  --  28*  CREATININE 1.29* 1.20  --  1.30*  CALCIUM 8.0*  --   --  8.5*  MG  --   --  1.6*  --   PROT 5.6*  --   --   --   ALBUMIN 3.2*  --   --   --   AST 22  --   --   --   ALT 17  --   --   --   ALKPHOS 50  --   --   --   BILITOT 0.3  --   --   --   GFRNONAA 54*  --   --  54*  ANIONGAP 9  --   --  7    Lipids  Recent Labs  Lab 07/28/21 0301  CHOL 162  TRIG 54  HDL 57  LDLCALC 94  CHOLHDL 2.8    Hematology Recent Labs  Lab 07/27/21 0025 07/27/21 0028 07/27/21 1824 07/28/21 0301  WBC 5.4  --   --  7.1  RBC 2.87*  --  2.73* 2.72*  HGB 9.8* 9.9*  --  9.3*  HCT 28.9* 29.0*  --  27.5*  MCV 100.7*  --   --  101.1*  MCH 34.1*  --   --  34.2*  MCHC 33.9  --   --  33.8  RDW 13.6  --   --  14.0  PLT 144*  --   --  121*   Thyroid  Recent Labs  Lab 07/27/21 1824  TSH 1.029    BNP Recent Labs  Lab 07/27/21 0026  BNP 51.3    DDimer No results for input(s): DDIMER in the last 168 hours.   Radiology    CT ANGIO HEAD NECK W WO CM  Result Date: 07/27/2021 CLINICAL DATA:  Initial evaluation for acute altered mental status. EXAM: CT ANGIOGRAPHY HEAD AND NECK TECHNIQUE: Multidetector CT imaging of the head and neck was performed using the standard protocol during bolus administration of intravenous contrast. Multiplanar CT image reconstructions and MIPs were obtained to evaluate the vascular anatomy. Carotid stenosis measurements (when applicable) are obtained utilizing NASCET criteria, using the distal internal carotid diameter as the denominator. RADIATION DOSE REDUCTION: This exam was performed according to the departmental dose-optimization program which includes automated exposure control, adjustment of the mA and/or kV according to patient size and/or use of iterative reconstruction technique.  CONTRAST:  41mL OMNIPAQUE IOHEXOL 350 MG/ML SOLN COMPARISON:  Head CT from earlier the same day. FINDINGS: CTA NECK FINDINGS Aortic arch: Visualized aortic arch normal caliber with normal branch pattern. Moderate atheromatous change about the arch itself. No hemodynamically significant stenosis about the origin the great vessels. Right carotid system: Right CCA patent from its origin to the bifurcation without stenosis. Scattered mixed plaque about the right carotid bulb/proximal right ICA without hemodynamically significant stenosis. Right ICA patent distally without stenosis or dissection. Left carotid system: Left CCA patent from its origin to the bifurcation without stenosis. Bulky calcified plaque about the left carotid bulb/proximal left ICA without hemodynamically significant stenosis. Left ICA patent distally without stenosis or dissection. Vertebral arteries: Both vertebral arteries arise from the subclavian arteries. Atheromatous change at the origin of the right vertebral artery with no more than mild stenosis. Vertebral arteries otherwise patent distally without stenosis or dissection. Skeleton: No discrete or worrisome osseous lesions. Mild-to-moderate spondylosis at C3-4 through C6-7. Patient is edentulous. Other neck: No other acute soft tissue abnormality within the neck. Upper chest: Visualized upper chest demonstrates no acute finding. Review of the MIP images confirms the above findings CTA HEAD FINDINGS Anterior circulation: Petrous segments patent bilaterally. Scattered atheromatous change within the carotid siphons without hemodynamically significant stenosis. A1 segments patent bilaterally. Normal anterior communicating artery complex. Anterior cerebral arteries patent without stenosis. No M1 stenosis or occlusion. Normal MCA bifurcations. Distal MCA branches well perfused and symmetric. Posterior circulation: Both V4 segments patent without stenosis. Both PICA origins patent and normal.  Basilar patent to its distal aspect without stenosis. Superior cerebellar arteries patent bilaterally. Both PCAs primarily supplied via the basilar. Atheromatous change throughout the PCAs bilaterally with associated moderate left P2 stenosis (series 12, image 25). PCAs remain patent to their distal aspects. Venous sinuses: Grossly patent allowing for timing the contrast bolus. Anatomic variants: None significant.  No aneurysm. Review of the MIP images confirms the above findings IMPRESSION: 1. Negative CTA for large vessel occlusion. 2. Moderate atheromatous change about the carotid bifurcations and carotid siphons without hemodynamically  significant stenosis. 3. Moderate left P2 stenosis. Electronically Signed   By: Jeannine Boga M.D.   On: 07/27/2021 02:36   CT Head Wo Contrast  Result Date: 07/27/2021 CLINICAL DATA:  Mental status change, unknown cause EXAM: CT HEAD WITHOUT CONTRAST TECHNIQUE: Contiguous axial images were obtained from the base of the skull through the vertex without intravenous contrast. RADIATION DOSE REDUCTION: This exam was performed according to the departmental dose-optimization program which includes automated exposure control, adjustment of the mA and/or kV according to patient size and/or use of iterative reconstruction technique. COMPARISON:  None. BRAIN: BRAIN Cerebral ventricle sizes are concordant with the degree of cerebral volume loss. Patchy and confluent areas of decreased attenuation are noted throughout the deep and periventricular white matter of the cerebral hemispheres bilaterally, compatible with chronic microvascular ischemic disease. No evidence of large-territorial acute infarction. No parenchymal hemorrhage. No mass lesion. No extra-axial collection. No mass effect or midline shift. No hydrocephalus. Basilar cisterns are patent. Vascular: No hyperdense vessel. Atherosclerotic calcifications are present within the cavernous internal carotid and vertebral  arteries. Skull: No acute fracture or focal lesion. Sinuses/Orbits: Paranasal sinuses and mastoid air cells are clear. Bilateral lens replacement. Otherwise the orbits are unremarkable. Other: None. IMPRESSION: No acute intracranial abnormality in a patient with diffuse atrophy and chronic microvascular ischemic changes. Electronically Signed   By: Iven Finn M.D.   On: 07/27/2021 01:40   MR BRAIN WO CONTRAST  Result Date: 07/27/2021 CLINICAL DATA:  88 year old male with unexplained altered mental status. EXAM: MRI HEAD WITHOUT CONTRAST TECHNIQUE: Multiplanar, multiecho pulse sequences of the brain and surrounding structures were obtained without intravenous contrast. COMPARISON:  CT head, CTA head and neck earlier today. FINDINGS: Brain: No restricted diffusion to suggest acute infarction. No midline shift, mass effect, evidence of mass lesion, ventriculomegaly, extra-axial collection or acute intracranial hemorrhage. Cervicomedullary junction and pituitary are within normal limits. Patchy and confluent widespread bilateral cerebral white matter T2 and FLAIR hyperintensity. No cortical encephalomalacia. No chronic cerebral blood products identified. Chronic lacunar infarct lateral left thalamus. But the other deep gray matter nuclei are within normal limits for age. Brainstem and cerebellum within normal limits for age. Vascular: Major intracranial vascular flow voids are preserved. Skull and upper cervical spine: Negative. Sinuses/Orbits: Postoperative changes to both globes. Otherwise negative orbits. Trace paranasal sinus mucosal thickening. Other: Mastoids are clear. Visible internal auditory structures appear normal. Scalp and face appear negative. IMPRESSION: 1. No acute intracranial abnormality. 2. 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 Electronically Signed   By: Genevie Ann  M.D.   On: 07/27/2021 05:29   CARDIAC CATHETERIZATION  Addendum Date: 07/27/2021     Prox RCA lesion is 100% stenosed.   1st Mrg lesion is 90% stenosed.   Mid Cx to Dist Cx lesion is 90% stenosed.   1st Diag lesion is 95% stenosed.   2nd Diag-1 lesion is 80% stenosed.   2nd Diag-2 lesion is 85% stenosed.   Mid LAD lesion is 85% stenosed.   There is mild left ventricular systolic dysfunction.   LV end diastolic pressure is normal. 1.  Moderately calcified coronary arteries with severe three-vessel coronary artery disease.  The culprit for myocardial infarction is likely occluded proximal right coronary artery.  The onset of occlusion occlusion is likely more than 12 hours with no chest pain. 2.  Mildly reduced LV systolic function with an EF of  40 to 45% with normal left ventricular end-diastolic pressure.  There is evidence of basal to mid inferior wall hypokinesis. Recommendations: Given diffuse three-vessel coronary artery disease, I think the best option for revascularization is CABG. Resume heparin in 4 hours at 7 PM. Multivessel PCI to the LAD, OM1 and left circumflex is also possible if the patient is deemed to be too high risk for CABG.  Result Date: 07/27/2021   Prox RCA lesion is 100% stenosed.   1st Mrg lesion is 90% stenosed.   Mid Cx to Dist Cx lesion is 90% stenosed.   1st Diag lesion is 95% stenosed.   2nd Diag-1 lesion is 80% stenosed.   2nd Diag-2 lesion is 85% stenosed.   Mid LAD lesion is 85% stenosed.   There is mild left ventricular systolic dysfunction.   LV end diastolic pressure is normal. 1.  Moderately calcified coronary arteries with severe three-vessel coronary artery disease.  The culprit for myocardial infarction is likely occluded proximal right coronary artery.  The onset of occlusion occlusion is likely more than 12 hours with no chest pain. 2.  Mildly reduced LV systolic function with an EF of 40 to 45% with normal left ventricular end-diastolic pressure.  There is evidence of  basal to mid inferior wall hypokinesis. Recommendations: Given diffuse three-vessel coronary artery disease, I think the best option for revascularization is CABG. Resume heparin in 4 hours at 7 PM.   DG Chest Port 1 View  Result Date: 07/27/2021 CLINICAL DATA:  Chest pain. EXAM: PORTABLE CHEST 1 VIEW COMPARISON:  Chest radiograph dated 10/06/2005 FINDINGS: No focal consolidation, pleural effusion, or pneumothorax. Top-normal cardiac size. Atherosclerotic calcification of the aorta. No acute osseous pathology. IMPRESSION: No active disease. Electronically Signed   By: Anner Crete M.D.   On: 07/27/2021 00:59    Cardiac Studies   Cardiac catheterization (07/27/2021)    Prox RCA lesion is 100% stenosed.   1st Mrg lesion is 90% stenosed.   Mid Cx to Dist Cx lesion is 90% stenosed.   1st Diag lesion is 95% stenosed.   2nd Diag-1 lesion is 80% stenosed.   2nd Diag-2 lesion is 85% stenosed.   Mid LAD lesion is 85% stenosed.   There is mild left ventricular systolic dysfunction.   LV end diastolic pressure is normal.   1.  Moderately calcified coronary arteries with severe three-vessel coronary artery disease.  The culprit for myocardial infarction is likely occluded proximal right coronary artery.  The onset of occlusion occlusion is likely more than 12 hours with no chest pain. 2.  Mildly reduced LV systolic function with an EF of 40 to 45% with normal left ventricular end-diastolic pressure.  There is evidence of basal to mid inferior wall hypokinesis.   Recommendations: Given diffuse three-vessel coronary artery disease, I think the best option for revascularization is CABG. Resume heparin in 4 hours at 7 PM.  Coronary Diagrams  Diagnostic Dominance: Right Intervention   Patient Profile     Danny [name] is a 88 y.o. male with a hx of HTN, HLD, no hx of CAD  who is being seen 07/27/2021 for the evaluation of elevated troponins at the request of Dr. Myna Hidalgo.   Assessment & Plan     1: Non-STEMI-troponins went up to 6000.  Cardiac catheterization yesterday revealed an occluded RCA with left-to-right collaterals and high-grade circumflex, LAD and diagonal branch disease.  Best option for revascularization is CABG.  He was seen by Dr. Cyndia Bent, from [place],  who tentatively scheduled him for bypass surgery on Monday.  He remains on IV heparin pain-free.  2: Essential hypertension-currently not on any antihypertensive medications with blood pressures that are fairly soft.  3: Hyperlipidemia-on rosuvastatin      For questions or updates, please contact [place] Please consult [email or web address] for contact info under        Signed, Quay Burow, MD  07/28/2021, 8:57 AM

## 2021-07-28 NOTE — Assessment & Plan Note (Addendum)
Patient apparently had some kind of shaking episode on the morning of admission. No focal neurological deficits noted.  MRI did not show any acute stroke.  Symptomatology not consistent with seizure activity.   Could have had perhaps a syncopal or near syncopal episode since he mentioned that it happened once he got out of bed.  Could have had a orthostatic drop in his blood pressure. No further recurrence.  Continue to monitor.

## 2021-07-28 NOTE — Assessment & Plan Note (Signed)
Renal function close to baseline.  Avoid nephrotoxic agents.  Monitor urine output.

## 2021-07-28 NOTE — Hospital Course (Addendum)
Referring: No ref. provider found Primary Care: Hoyt Koch, MD Primary Cardiologist:Philip Nahser, MD   Chief Complaint:       Chief Complaint  Patient presents with   Code STEMI      History of Present Illness: At time of consultation   The patient is an 88 year old male who presented to the ER today as a code STEMI.  He has a history of hypertension, hyperlipidemia but no previous history of CAD.  He had a previous negative nuclear scan for ischemia and echocardiogram in March 2020 with normal LV function and mild aortic stenosis.  He has been doing well up until today when EMS was called as he became unresponsive with possible seizure-like activity.  The shaking lasted less than a minute but the unresponsiveness lasted longer.  Upon arrival of EMS he was confused and combative.  He was given Versed which calmed him.  He was initially noted also to be hypotensive and bradycardic.  ST segment elevation was noted on EKG and a code STEMI was called.  He had no chest pain and initial troponin was negative so it was canceled.  Increased high-sensitivity's troponin's continued and peak thus far has been 5799.  EKG in the ER did show inferior lead ischemic changes.  He has subsequently been taken to the Cath Lab where he was found to have severe three-vessel coronary artery disease with the culprit lesion probably RCA.  His EF is noted to be 40 to 45% with normal left ventricular end-diastolic pressures.  There was evidence of basal to mid inferior wall hypokinesis.  The patient does state that he has been under a lot of stress recently.  He has no previous history of seizures or CVA.    A CT scan of the head showed no hemorrhage.  An MRI of the brain showed no acute intracranial abnormality.  There was a chronic lacunar infarct in the left thalamus and advanced cerebral white matter signal changes which are nonspecific but most likely also are small vessel disease related.  A CT angio of the head  was negative for large vessel occlusion.  Please see full reports below.  Cardiology feels best revascularization option is surgical revascularization and we are asked to see the patient for consideration of CABG.  If deemed too high risk PCI is an option.  His mental status and neurological exam has normalized upon medical stabilization.  He does not recall the events leading up to the emergency room visit.  He has not had any recent illnesses.  He has not had any recent COVID or influenza infections.He did note he had some dark stool this am and has been found to be moderately anemic.  He is fairly active in general and says he walks a lot as well as lifts weights.  He also takes care of his wife who is ill and has significant physical impairments.  The patient and all relevant studies were reviewed by Dr. Cyndia Bent who agreed with recommendations to proceed with coronary artery surgical revascularization.  Hospital course: Following ongoing medical stabilization the patient was felt able to proceed, and on 08/01/2021 he was taken to the operating room and underwent coronary artery bypass grafting x3.  Surgery was performed by Dr. Cyndia Bent, tolerated well, and the patient was taken to the surgical intensive care unit in stable condition.   Postoperative hospital course:  The patient was extubated using standard post cardiac surgical protocols without difficulty on the evening of surgery.  He has  remained neurologically intact.  He is remained hemodynamically stable but initially does require some pressor support which was weaned over time.  He does have an expected acute blood loss anemia which is being monitored clinically.  Is not in the transfusion threshold.  Blood sugars have been under good control using standard measures. It is noted he did have some acute renal insufficiency felt most likely to be acute kidney injury due to perioperative hypotension.  He was started on renal dose dopamine for this for a  short time.  He had significant volume overload and peripheral edema but did respond well to diuretics over time with stabilization of his creatinine.   He was evaluated by PT/OT who recommended CIR at time of discharge.  However the patient declined this and requested home health services.  The patient was maintaining NSR with a moderate amount of ventricular ectopy His pacing wires were removed without difficulty.  He developed some left arm swelling and edema and venous duplex was obtained.  It was negative for DVT.  He did have some clot in a superficial cephalic vein.  He was placed on Keflex prophylactically and showed overall good improvement.  His incisions are all healing well without evidence of infection.  He was weaned off oxygen and maintaining good saturations.  He was tolerating diet.  He was tolerating routine cardiac rehab.  At the time of discharge he was felt to be quite stable.  Home health care arrangements have been made.

## 2021-07-28 NOTE — Progress Notes (Signed)
ANTICOAGULATION CONSULT NOTE  Pharmacy Consult for heparin Indication: chest pain/ACS  Heparin Dosing Weight: 68 kg  Labs: Recent Labs    07/27/21 0025 07/27/21 0028 07/27/21 0225 07/27/21 0530 07/27/21 0928 07/28/21 0301  HGB 9.8* 9.9*  --   --   --  9.3*  HCT 28.9* 29.0*  --   --   --  27.5*  PLT 144*  --   --   --   --  121*  APTT 26  --   --   --   --   --   LABPROT 13.5  --   --   --   --   --   INR 1.0  --   --   --   --   --   HEPARINUNFRC  --   --   --   --   --  0.28*  CREATININE 1.29* 1.20  --   --   --  1.30*  TROPONINIHS 9  --  61* 993* 5,799*  --      Estimated Creatinine Clearance: 34.8 mL/min (A) (by C-G formula based on SCr of 1.3 mg/dL (H)).  Assessment: 88 yom presenting with CP, elevated and increasing high-sensitivity troponin. Pharmacy consulted to dose heparin. Patient is not on anticoagulation PTA.   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 Restarted IV heparin post cath. Currently on heparin infusion at 800 units/hr. Initial HL is slightly subtherapeutic at 0.28. No s/s of bleeding noted per RN   Goal of Therapy:  Heparin level 0.3-0.7 units/ml Monitor platelets by anticoagulation protocol: Yes   Plan:  Increase heparin infusion to 850 units/hr  Order 8 hr heparin level  Monitor daily CBC, s/sx bleeding F/u CABG evaluation  Albertina Parr, PharmD., BCPS, BCCCP Clinical Pharmacist Please refer to [facility] for unit-specific pharmacist

## 2021-07-29 DIAGNOSIS — I2510 Atherosclerotic heart disease of native coronary artery without angina pectoris: Secondary | ICD-10-CM | POA: Diagnosis not present

## 2021-07-29 DIAGNOSIS — E538 Deficiency of other specified B group vitamins: Secondary | ICD-10-CM | POA: Diagnosis present

## 2021-07-29 DIAGNOSIS — I214 Non-ST elevation (NSTEMI) myocardial infarction: Secondary | ICD-10-CM | POA: Diagnosis not present

## 2021-07-29 LAB — BASIC METABOLIC PANEL
Anion gap: 7 (ref 5–15)
BUN: 33 mg/dL — ABNORMAL HIGH (ref 8–23)
CO2: 24 mmol/L (ref 22–32)
Calcium: 8.9 mg/dL (ref 8.9–10.3)
Chloride: 106 mmol/L (ref 98–111)
Creatinine, Ser: 1.22 mg/dL (ref 0.61–1.24)
GFR, Estimated: 58 mL/min — ABNORMAL LOW (ref >60)
Glucose, Bld: 115 mg/dL — ABNORMAL HIGH (ref 70–99)
Potassium: 4.9 mmol/L (ref 3.5–5.1)
Sodium: 137 mmol/L (ref 135–145)

## 2021-07-29 LAB — CBC
HCT: 27.8 % — ABNORMAL LOW (ref 39.0–52.0)
Hemoglobin: 9.4 g/dL — ABNORMAL LOW (ref 13.0–17.0)
MCH: 33.8 pg (ref 26.0–34.0)
MCHC: 33.8 g/dL (ref 30.0–36.0)
MCV: 100 fL (ref 80.0–100.0)
Platelets: 130 10*3/uL — ABNORMAL LOW (ref 150–400)
RBC: 2.78 MIL/uL — ABNORMAL LOW (ref 4.22–5.81)
RDW: 13.9 % (ref 11.5–15.5)
WBC: 9.9 10*3/uL (ref 4.0–10.5)
nRBC: 0 % (ref 0.0–0.2)

## 2021-07-29 LAB — HEPARIN LEVEL (UNFRACTIONATED): Heparin Unfractionated: 0.33 IU/mL (ref 0.30–0.70)

## 2021-07-29 LAB — MAGNESIUM: Magnesium: 1.9 mg/dL (ref 1.7–2.4)

## 2021-07-29 MED ORDER — INSULIN REGULAR(HUMAN) IN NACL 100-0.9 UT/100ML-% IV SOLN
INTRAVENOUS | Status: AC
  Administered 2021-08-01: .7 [IU]/h via INTRAVENOUS
  Filled 2021-07-29: qty 100

## 2021-07-29 MED ORDER — MILRINONE LACTATE IN DEXTROSE 20-5 MG/100ML-% IV SOLN
0.3000 ug/kg/min | INTRAVENOUS | Status: AC
  Administered 2021-08-01: .25 ug/kg/min via INTRAVENOUS
  Filled 2021-07-29: qty 100

## 2021-07-29 MED ORDER — POTASSIUM CHLORIDE 2 MEQ/ML IV SOLN
80.0000 meq | INTRAVENOUS | Status: DC
  Filled 2021-07-29: qty 40

## 2021-07-29 MED ORDER — TRANEXAMIC ACID (OHS) PUMP PRIME SOLUTION
2.0000 mg/kg | INTRAVENOUS | Status: DC
  Filled 2021-07-29: qty 1.38

## 2021-07-29 MED ORDER — PLASMA-LYTE A IV SOLN
INTRAVENOUS | Status: DC
  Filled 2021-07-29: qty 5

## 2021-07-29 MED ORDER — HEPARIN 30,000 UNITS/1000 ML (OHS) CELLSAVER SOLUTION
Status: DC
  Filled 2021-07-29: qty 1000

## 2021-07-29 MED ORDER — TRANEXAMIC ACID 1000 MG/10ML IV SOLN
1.5000 mg/kg/h | INTRAVENOUS | Status: AC
  Administered 2021-08-01: 1.5 mg/kg/h via INTRAVENOUS
  Filled 2021-07-29: qty 25

## 2021-07-29 MED ORDER — CEFAZOLIN SODIUM-DEXTROSE 2-4 GM/100ML-% IV SOLN
2.0000 g | INTRAVENOUS | Status: DC
  Filled 2021-07-29: qty 100

## 2021-07-29 MED ORDER — CEFAZOLIN SODIUM-DEXTROSE 2-4 GM/100ML-% IV SOLN
2.0000 g | INTRAVENOUS | Status: AC
  Administered 2021-08-01 (×2): 2 g via INTRAVENOUS
  Filled 2021-07-29: qty 100

## 2021-07-29 MED ORDER — TRANEXAMIC ACID (OHS) BOLUS VIA INFUSION
15.0000 mg/kg | INTRAVENOUS | Status: AC
  Administered 2021-08-01: 1035 mg via INTRAVENOUS
  Filled 2021-07-29: qty 1035

## 2021-07-29 MED ORDER — VANCOMYCIN HCL 1250 MG/250ML IV SOLN
1250.0000 mg | INTRAVENOUS | Status: AC
  Administered 2021-08-01: 1250 mg via INTRAVENOUS
  Filled 2021-07-29: qty 250

## 2021-07-29 MED ORDER — PHENYLEPHRINE HCL-NACL 20-0.9 MG/250ML-% IV SOLN
30.0000 ug/min | INTRAVENOUS | Status: AC
  Administered 2021-08-01: 75 ug/min via INTRAVENOUS
  Administered 2021-08-01: 60 ug/min via INTRAVENOUS
  Filled 2021-07-29: qty 250

## 2021-07-29 MED ORDER — DEXMEDETOMIDINE HCL IN NACL 400 MCG/100ML IV SOLN
0.1000 ug/kg/h | INTRAVENOUS | Status: AC
  Administered 2021-08-01: .2 ug/kg/h via INTRAVENOUS
  Filled 2021-07-29: qty 100

## 2021-07-29 MED ORDER — EPINEPHRINE HCL 5 MG/250ML IV SOLN IN NS
0.0000 ug/min | INTRAVENOUS | Status: AC
  Administered 2021-08-01: 5 ug/min via INTRAVENOUS
  Filled 2021-07-29: qty 250

## 2021-07-29 MED ORDER — NOREPINEPHRINE 4 MG/250ML-% IV SOLN
0.0000 ug/min | INTRAVENOUS | Status: AC
  Administered 2021-08-01: 2 ug/min via INTRAVENOUS
  Filled 2021-07-29: qty 250

## 2021-07-29 MED ORDER — NITROGLYCERIN IN D5W 200-5 MCG/ML-% IV SOLN
2.0000 ug/min | INTRAVENOUS | Status: DC
  Filled 2021-07-29: qty 250

## 2021-07-29 MED ORDER — MAGNESIUM SULFATE 50 % IJ SOLN
40.0000 meq | INTRAMUSCULAR | Status: DC
  Filled 2021-07-29: qty 9.85

## 2021-07-29 NOTE — TOC Progression Note (Signed)
Transition of Care [facility]) - Progression Note    Patient Details  Name: Danny [name] MRN: [id number] Date of Birth: May 01, 1934  [facility] CM/SW Contact  Zenon Mayo, RN Phone Number: 07/29/2021, 10:26 AM  Clinical Narrative:    Patient is from home with wife who is retired and will be there 24/7, she had hip surgery last year.  His daughter lives with them as well she 88 yo she is able to ast them at home she does not work.  He states he has walker, cane , and w/chair. Daughter takes him to [facility] apt[facility] sometimes and sometimes he drives himself.  Uses [facility] on [address] for medications.  He states he would like the [place] here to fill his medications if they can.If plan is to go home he will have transportation home via car with daughter.  Also NCM offered choice for [facility] services if needed he and wife did not have a preference but said to [facility].  NCM made referral to [facility] with [facility] for HHPT for after surgery.  TOC will continue to monitor patient needs.     Expected Discharge Plan: [place] Barriers to Discharge: Continued Medical Work up  Expected Discharge Plan and Services Expected Discharge Plan: [place] In-house Referral: NA Discharge Planning Services: CM Consult Post Acute Care Choice: [place] arrangements for the past 2 months: Single Family Home                   DME Agency: NA       HH Arranged: PT HH Agency: [place] Date Roy: 07/29/21 Time HH Agency Contacted: 1026 Representative spoke with at [place]: [place] ([place]) Interventions    Readmission Risk Interventions No flowsheet data found.

## 2021-07-29 NOTE — Progress Notes (Signed)
Mobility Specialist: Progress Note   07/29/21 1114  Mobility  Bed Position Chair  Activity Ambulated with assistance in hallway  Level of Assistance Contact guard assist, steadying assist  Assistive Device  (IV Pole)  Distance Ambulated (ft) 180 ft  Activity Response Tolerated well  $Mobility charge 1 Mobility   Post-Mobility: 83 HR  Pt presents with some unsteadiness upon standing but progressed better with distance, no c/o throughout. Pt back to recliner after walk with call bell and phone in reach.   [facility] Taira Knabe Mobility Specialist Mobility Specialist 4 [place]: [phone] Mobility Specialist 2 [place] and [place]: [phone]

## 2021-07-29 NOTE — Progress Notes (Addendum)
[facility] PROGRESS NOTE   Danny [name] LNL:[id number] DOB: Sep 09, 1933 DOA: 07/27/2021  2 DOS: the patient was seen and examined on 07/29/2021  PCP: Hoyt Koch, MD  Brief History and Hospital Course:  88 y.o. male with medical history significant of hypertension comes in after being found unresponsive and shaking by his wife.  Patient came in via EMS and was called in as a code STEMI.  Patient was noted to be hypotensive with systolics in the 40C and 14G and bradycardic.  He became combative in the ambulance was given Versed and IV fluids.  Subsequently seen by cardiology.  Code STEMI was canceled.  Repeat troponin was noted to be significantly elevated.  Underwent cardiac catheterization which revealed triple-vessel disease.  Plan is for CABG on Monday.   Consultants: Cardiology.  Cardiothoracic surgery  Procedures:  Cardiac catheterization.   Transthoracic echocardiogram.    Subjective: Patient denies any chest pain shortness of breath.  Slightly anxious about surgery.    Assessment/Plan:  * NSTEMI (non-ST elevated myocardial infarction) ([place])- (present on admission) Initially there was concern for STEMI.  Cardiology was consulted.  Not thought to be a STEMI.  However patient did have significant rise in troponin.  Subsequently thought to be a non-STEMI.   Underwent cardiac catheterization which showed triple-vessel disease. Cardiothoracic surgery was consulted.  Plan is for CABG on Monday.   Patient is on heparin infusion.  Patient is on aspirin and statin.   Defer further cardiac medications to cardiology who is also following.  Has not been placed on beta-blockers or ACE inhibitor due to low blood pressures. LDL noted to be 94.   Echocardiogram shows normal systolic function without regional wall motion abnormalities. Cardiac status is stable.  Denies any chest pain this morning.  Essential hypertension- (present on admission) Blood pressure has been  borderline low.  Holding his antihypertensives.  Was on HCTZ and losartan prior to admission.    CKD (chronic kidney disease) stage 3, GFR 30-59 ml/min (HCC)- (present on admission) Renal function close to baseline.  Avoid nephrotoxic agents.  Monitor urine output.  Macrocytic anemia- (present on admission) Hemoglobin noted to be low.  Stool for occult blood was negative. Anemia panel does not show any clear-cut deficiency but B12 level is noted to be on the low side.  Start supplementing.  No other reason for anemia is determined at this time.  TSH is normal.  Thrombocytopenia (HCC) Continue to monitor while on heparin.  Counts are stable.  Seizure-like activity [facility]) Patient apparently had some kind of shaking episode on the morning of admission. No focal neurological deficits noted.  MRI did not show any acute stroke.  Symptomatology not consistent with seizure activity.   Could have had perhaps a syncopal or near syncopal episode since he mentioned that it happened once he got out of bed.  Could have had a orthostatic drop in his blood pressure. No further recurrence.  Continue to monitor.       DVT Prophylaxis: On IV heparin Code Status: Full code Family Communication: Discussed with the patient Disposition Plan: To be determined  Status is: Inpatient  Remains inpatient appropriate because: NSTEMI.  Need for bypass surgery         Medications: Scheduled:  aspirin EC  81 mg Oral Daily   rosuvastatin  20 mg Oral Daily   sodium chloride flush  3 mL Intravenous Q12H   vitamin B-12  1,000 mcg Oral Daily   Continuous:  sodium chloride  20 mL/hr at 07/27/21 1304   sodium chloride     heparin 900 Units/hr (07/29/21 0853)   ZCH:YIFOYD chloride, acetaminophen, nitroGLYCERIN, ondansetron (ZOFRAN) IV, sodium chloride flush  Antibiotics: Anti-infectives (From admission, onward)    None       Objective:  Vital Signs  Vitals:   07/28/21 0438 07/28/21 1156 07/28/21  1947 07/29/21 0451  BP: (!) 103/58 117/60 (!) 108/57 (!) 151/64  Pulse: 65 61 67 73  Resp: 16 16 16    Temp: 98.3 F (36.8 C) 97.9 F (36.6 C) 98.5 F (36.9 C) 98.5 F (36.9 C)  TempSrc: Oral Oral Oral Oral  SpO2: 97%  100% 95%  Weight:    69 kg  Height:        Intake/Output Summary (Last 24 hours) at 07/29/2021 0939 Last data filed at 07/28/2021 1838 Gross per 24 hour  Intake 330 ml  Output 400 ml  Net -70 ml   Filed Weights   07/27/21 0700 07/28/21 0433 07/29/21 0451  Weight: 68 kg 69.2 kg 69 kg    General appearance: Awake alert.  In no distress Resp: Clear to auscultation bilaterally.  Normal effort Cardio: S1-S2 is normal regular.  No S3-S4.  No rubs murmurs or bruit GI: Abdomen is soft.  Nontender nondistended.  Bowel sounds are present normal.  No masses organomegaly Extremities: No edema.  Full range of motion of lower extremities. Neurologic: Alert and oriented x3.  No focal neurological deficits.     Lab Results:  Data Reviewed: I have personally reviewed labs and imaging study reports  CBC: Recent Labs  Lab 07/27/21 0025 07/27/21 0028 07/28/21 0301 07/29/21 0254  WBC 5.4  --  7.1 9.9  NEUTROABS 3.0  --   --   --   HGB 9.8* 9.9* 9.3* 9.4*  HCT 28.9* 29.0* 27.5* 27.8*  MCV 100.7*  --  101.1* 100.0  PLT 144*  --  121* 130*    Basic Metabolic Panel: Recent Labs  Lab 07/27/21 0025 07/27/21 0028 07/27/21 1004 07/28/21 0301 07/29/21 0254  NA 135 138  --  136 137  K 3.8 3.8  --  4.0 4.9  CL 107 105  --  108 106  CO2 19*  --   --  21* 24  GLUCOSE 117* 114*  --  115* 115*  BUN 28* 26*  --  28* 33*  CREATININE 1.29* 1.20  --  1.30* 1.22  CALCIUM 8.0*  --   --  8.5* 8.9  MG  --   --  1.6*  --  1.9    GFR: Estimated Creatinine Clearance: 37.1 mL/min (by C-G formula based on SCr of 1.22 mg/dL).  Liver Function Tests: Recent Labs  Lab 07/27/21 0025  AST 22  ALT 17  ALKPHOS 50  BILITOT 0.3  PROT 5.6*  ALBUMIN 3.2*     Coagulation  Profile: Recent Labs  Lab 07/27/21 0025  INR 1.0     HbA1C: Recent Labs    07/27/21 0025  HGBA1C 5.5      Lipid Profile: Recent Labs    07/27/21 0025 07/28/21 0301  CHOL 174 162  HDL 58 57  LDLCALC 105* 94  TRIG 57 54  CHOLHDL 3.0 2.8    Thyroid Function Tests: Recent Labs    07/27/21 1824  TSH 1.029    Anemia Panel: Recent Labs    07/27/21 1824  VITAMINB12 201  FOLATE 16.9  FERRITIN 135  TIBC 287  IRON 109  RETICCTPCT 1.3  Recent Results (from the past 240 hour(s))  Resp Panel by RT-PCR (Flu A&B, Covid) Nasopharyngeal Swab     Status: None   Collection Time: 07/27/21 12:25 AM   Specimen: Nasopharyngeal Swab; Nasopharyngeal(NP) swabs in vial transport medium  Result Value Ref Range Status   SARS Coronavirus 2 by RT PCR NEGATIVE NEGATIVE Final    Comment: (NOTE) SARS-CoV-2 target nucleic acids are NOT DETECTED.  The SARS-CoV-2 RNA is generally detectable in upper respiratory specimens during the acute phase of infection. The lowest concentration of SARS-CoV-2 viral copies this assay can detect is 138 copies/mL. A negative result does not preclude SARS-Cov-2 infection and should not be used as the sole basis for treatment or other patient management decisions. A negative result may occur with  improper specimen collection/handling, submission of specimen other than nasopharyngeal swab, presence of viral mutation(s) within the areas targeted by this assay, and inadequate number of viral copies(<138 copies/mL). A negative result must be combined with clinical observations, patient history, and epidemiological information. The expected result is Negative.  Fact Sheet for Patients:  EntrepreneurPulse.com.au  Fact Sheet for Healthcare Providers:  IncredibleEmployment.be  This test is no t yet approved or cleared by the [place] FDA and  has been authorized for detection and/or diagnosis of SARS-CoV-2  by FDA under an Emergency Use Authorization (EUA). This EUA will remain  in effect (meaning this test can be used) for the duration of the COVID-19 declaration under Section 564(b)(1) of the Act, 21 U.S.C.section 360bbb-3(b)(1), unless the authorization is terminated  or revoked sooner.       Influenza A by PCR NEGATIVE NEGATIVE Final   Influenza B by PCR NEGATIVE NEGATIVE Final    Comment: (NOTE) The Xpert Xpress SARS-CoV-2/FLU/RSV plus assay is intended as an aid in the diagnosis of influenza from Nasopharyngeal swab specimens and should not be used as a sole basis for treatment. Nasal washings and aspirates are unacceptable for Xpert Xpress SARS-CoV-2/FLU/RSV testing.  Fact Sheet for Patients: EntrepreneurPulse.com.au  Fact Sheet for Healthcare Providers: IncredibleEmployment.be  This test is not yet approved or cleared by the [place] FDA and has been authorized for detection and/or diagnosis of SARS-CoV-2 by FDA under an Emergency Use Authorization (EUA). This EUA will remain in effect (meaning this test can be used) for the duration of the COVID-19 declaration under Section 564(b)(1) of the Act, 21 U.S.C. section 360bbb-3(b)(1), unless the authorization is terminated or revoked.  Performed at [facility], [place] [address] [place], [place] [address]       Radiology Studies: CARDIAC CATHETERIZATION  Addendum Date: 07/27/2021     Prox RCA lesion is 100% stenosed.   1st Mrg lesion is 90% stenosed.   Mid Cx to Dist Cx lesion is 90% stenosed.   1st Diag lesion is 95% stenosed.   2nd Diag-1 lesion is 80% stenosed.   2nd Diag-2 lesion is 85% stenosed.   Mid LAD lesion is 85% stenosed.   There is mild left ventricular systolic dysfunction.   LV end diastolic pressure is normal. 1.  Moderately calcified coronary arteries with severe three-vessel coronary artery disease.  The culprit for myocardial infarction is likely occluded  proximal right coronary artery.  The onset of occlusion occlusion is likely more than 12 hours with no chest pain. 2.  Mildly reduced LV systolic function with an EF of 40 to 45% with normal left ventricular end-diastolic pressure.  There is evidence of basal to mid inferior wall hypokinesis. Recommendations: Given diffuse three-vessel  coronary artery disease, I think the best option for revascularization is CABG. Resume heparin in 4 hours at 7 PM. Multivessel PCI to the LAD, OM1 and left circumflex is also possible if the patient is deemed to be too high risk for CABG.  Result Date: 07/27/2021   Prox RCA lesion is 100% stenosed.   1st Mrg lesion is 90% stenosed.   Mid Cx to Dist Cx lesion is 90% stenosed.   1st Diag lesion is 95% stenosed.   2nd Diag-1 lesion is 80% stenosed.   2nd Diag-2 lesion is 85% stenosed.   Mid LAD lesion is 85% stenosed.   There is mild left ventricular systolic dysfunction.   LV end diastolic pressure is normal. 1.  Moderately calcified coronary arteries with severe three-vessel coronary artery disease.  The culprit for myocardial infarction is likely occluded proximal right coronary artery.  The onset of occlusion occlusion is likely more than 12 hours with no chest pain. 2.  Mildly reduced LV systolic function with an EF of 40 to 45% with normal left ventricular end-diastolic pressure.  There is evidence of basal to mid inferior wall hypokinesis. Recommendations: Given diffuse three-vessel coronary artery disease, I think the best option for revascularization is CABG. Resume heparin in 4 hours at 7 PM.   ECHOCARDIOGRAM COMPLETE  Result Date: 07/28/2021    ECHOCARDIOGRAM REPORT   Patient Name:   Danny [name] Date of Exam: 07/28/2021 Medical Rec #:  [id number]      Height:       64.0 in Accession #:    [id number]     Weight:       152.5 lb Date of Birth:  1934-06-25     BSA:          1.743 m Patient Age:    63 years       BP:           103/58 mmHg Patient Gender: M              HR:            66 bpm. Exam Location:  Inpatient Procedure: 2D Echo Indications:    Nstemi  History:        Patient has prior history of Echocardiogram examinations, most                 recent 09/24/2018. Risk Factors:Hypertension.  Sonographer:    Jefferey Pica Referring Phys: [id number] WNIOEVOJ A ARIDA  Sonographer Comments: Image acquisition challenging due to respiratory motion. IMPRESSIONS  1. Akinesis of the basal inferior wall with overall preserved LV function; calcified aortic valve with very mild AS (mean gradient 9 mmHg).  2. Left ventricular ejection fraction, by estimation, is 55 to 60%. The left ventricle has normal function. The left ventricle demonstrates regional wall motion abnormalities (see scoring diagram/findings for description). Left ventricular diastolic parameters are consistent with Grade I diastolic dysfunction (impaired relaxation).  3. Right ventricular systolic function is normal. The right ventricular size is normal. There is normal pulmonary artery systolic pressure.  4. The mitral valve is normal in structure. Trivial mitral valve regurgitation. No evidence of mitral stenosis.  5. The aortic valve is calcified. Aortic valve regurgitation is mild. No aortic stenosis is present.  6. Aortic dilatation noted. There is borderline dilatation of the aortic root, measuring 39 mm.  7. The inferior vena cava is normal in size with greater than 50% respiratory variability, suggesting right atrial pressure of 3 mmHg. Comparison(s): Prior images  unable to be directly viewed, comparison made by report only. FINDINGS  Left Ventricle: Left ventricular ejection fraction, by estimation, is 55 to 60%. The left ventricle has normal function. The left ventricle demonstrates regional wall motion abnormalities. The left ventricular internal cavity size was normal in size. There is no left ventricular hypertrophy. Left ventricular diastolic parameters are consistent with Grade I diastolic dysfunction (impaired  relaxation). Right Ventricle: The right ventricular size is normal. Right ventricular systolic function is normal. There is normal pulmonary artery systolic pressure. The tricuspid regurgitant velocity is 1.89 m/s, and with an assumed right atrial pressure of 15 mmHg, the estimated right ventricular systolic pressure is 57.3 mmHg. Left Atrium: Left atrial size was normal in size. Right Atrium: Right atrial size was normal in size. Pericardium: There is no evidence of pericardial effusion. Mitral Valve: The mitral valve is normal in structure. Mild mitral annular calcification. Trivial mitral valve regurgitation. No evidence of mitral valve stenosis. Tricuspid Valve: The tricuspid valve is normal in structure. Tricuspid valve regurgitation is mild . No evidence of tricuspid stenosis. Aortic Valve: The aortic valve is calcified. Aortic valve regurgitation is mild. Aortic regurgitation PHT measures 697 msec. No aortic stenosis is present. Aortic valve mean gradient measures 9.0 mmHg. Aortic valve peak gradient measures 17.7 mmHg. Aortic valve area, by VTI measures 2.20 cm. Pulmonic Valve: The pulmonic valve was normal in structure. Pulmonic valve regurgitation is not visualized. No evidence of pulmonic stenosis. Aorta: Aortic dilatation noted. There is borderline dilatation of the aortic root, measuring 39 mm. Venous: The inferior vena cava is normal in size with greater than 50% respiratory variability, suggesting right atrial pressure of 3 mmHg. IAS/Shunts: The interatrial septum is aneurysmal. No atrial level shunt detected by color flow Doppler. Additional Comments: Akinesis of the basal inferior wall with overall preserved LV function; calcified aortic valve with very mild AS (mean gradient 9 mmHg).  LEFT VENTRICLE PLAX 2D LVIDd:         4.50 cm   Diastology LVIDs:         2.80 cm   LV e' medial:    5.74 cm/s LV PW:         0.90 cm   LV E/e' medial:  8.9 LV IVS:        1.00 cm   LV e' lateral:   8.59 cm/s LVOT  diam:     2.00 cm   LV E/e' lateral: 5.9 LV SV:         102 LV SV Index:   59 LVOT Area:     3.14 cm  RIGHT VENTRICLE             IVC RV Basal diam:  2.50 cm     IVC diam: 2.20 cm RV S prime:     10.30 cm/s TAPSE (M-mode): 1.3 cm LEFT ATRIUM             Index        RIGHT ATRIUM           Index LA diam:        3.40 cm 1.95 cm/m   RA Area:     12.90 cm LA Vol (A2C):   48.8 ml 27.99 ml/m  RA Volume:   30.60 ml  17.55 ml/m LA Vol (A4C):   37.1 ml 21.28 ml/m LA Biplane Vol: 43.0 ml 24.66 ml/m  AORTIC VALVE  PULMONIC VALVE AV Area (Vmax):    2.13 cm      PV Vmax:       0.57 m/s AV Area (Vmean):   2.14 cm      PV Peak grad:  1.3 mmHg AV Area (VTI):     2.20 cm AV Vmax:           210.50 cm/s AV Vmean:          140.500 cm/s AV VTI:            0.465 m AV Peak Grad:      17.7 mmHg AV Mean Grad:      9.0 mmHg LVOT Vmax:         143.00 cm/s LVOT Vmean:        95.600 cm/s LVOT VTI:          0.325 m LVOT/AV VTI ratio: 0.70 AI PHT:            697 msec  AORTA Ao Root diam: 3.90 cm Ao Asc diam:  3.40 cm MITRAL VALVE                TRICUSPID VALVE MV Area (PHT): 4.04 cm     TR Peak grad:   14.3 mmHg MV Decel Time: 188 msec     TR Vmax:        189.00 cm/s MV E velocity: 51.00 cm/s MV A velocity: 102.00 cm/s  SHUNTS MV E/A ratio:  0.50         Systemic VTI:  0.32 m                             Systemic Diam: 2.00 cm Kirk Ruths MD Electronically signed by Kirk Ruths MD Signature Date/Time: 07/28/2021/1:07:43 PM    Final    VAS US DOPPLER PRE CABG  Result Date: 07/28/2021 PREOPERATIVE VASCULAR EVALUATION Patient Name:  Danny [name]  Date of Exam:   07/28/2021 Medical Rec #: [id number]       Accession #:    [id number] Date of Birth: 1934-05-08      Patient Gender: M Patient Age:   39 years Exam Location:  [facility] Procedure:      VAS US DOPPLER PRE CABG Referring Phys: Gilford Raid --------------------------------------------------------------------------------  Indications:       Pre-CABG. Risk Factors:     Hypertension, hyperlipidemia, past history of smoking, prior                   MI. Limitations:      Involuntary patient movement of both hands and feet Comparison Study: 10-25-2017 Lower extremity arterial studies showed mild                   arterial disease bilaterally. ABI 0.92/0.46 RT and 0.94/0.48                   LT. Performing Technologist: Darlin Coco RDMS RVT  Examination Guidelines: A complete evaluation includes B-mode imaging, spectral Doppler, color Doppler, and power Doppler as needed of all accessible portions of each vessel. Bilateral testing is considered an integral part of a complete examination. Limited examinations for reoccurring indications may be performed as noted.  Right Carotid Findings: +----------+--------+--------+--------+---------------------+------------------+             PSV cm/s EDV cm/s Stenosis Describe              Comments            +----------+--------+--------+--------+---------------------+------------------+  CCA Prox   60       14                                                          +----------+--------+--------+--------+---------------------+------------------+  CCA Distal 61       18                                      intimal thickening  +----------+--------+--------+--------+---------------------+------------------+  ICA Prox   130      47       40-59%   heterogenous,                                                                    hypoechoic and smooth                     +----------+--------+--------+--------+---------------------+------------------+  ICA Mid    129      50                                      tortuous            +----------+--------+--------+--------+---------------------+------------------+  ICA Distal 63       21                                      tortuous            +----------+--------+--------+--------+---------------------+------------------+  ECA        90       10                                                           +----------+--------+--------+--------+---------------------+------------------+ +----------+--------+-------+----------------+------------+             PSV cm/s EDV cms Describe         Arm Pressure  +----------+--------+-------+----------------+------------+  Subclavian 71               Multiphasic, WNL               +----------+--------+-------+----------------+------------+ +---------+--------+--+--------+--+---------+  Vertebral PSV cm/s 59 EDV cm/s 16 Antegrade  +---------+--------+--+--------+--+---------+ Left Carotid Findings: +----------+--------+--------+--------+--------+--------+             PSV cm/s EDV cm/s Stenosis Describe Comments  +----------+--------+--------+--------+--------+--------+  CCA Prox   73       21                                   +----------+--------+--------+--------+--------+--------+  CCA Distal 71       25                                   +----------+--------+--------+--------+--------+--------+  ICA Prox   94       33       1-39%    calcific           +----------+--------+--------+--------+--------+--------+  ICA Distal 76       26                                   +----------+--------+--------+--------+--------+--------+  ECA        65       11                                   +----------+--------+--------+--------+--------+--------+ +----------+--------+--------+----------------+------------+  Subclavian PSV cm/s EDV cm/s Describe         Arm Pressure  +----------+--------+--------+----------------+------------+             104               Multiphasic, WNL               +----------+--------+--------+----------------+------------+ +---------+--------+--+--------+--+---------+  Vertebral PSV cm/s 53 EDV cm/s 22 Antegrade  +---------+--------+--+--------+--+---------+  ABI Findings: +---------+------------------+-----+----------+--------+  Right     Rt Pressure (mmHg) Index Waveform   Comment   +---------+------------------+-----+----------+--------+   Brachial  124                      triphasic            +---------+------------------+-----+----------+--------+  PTA       66                 0.53  monophasic           +---------+------------------+-----+----------+--------+  DP        81                 0.65  biphasic             +---------+------------------+-----+----------+--------+  Great Toe 50                 0.40  Abnormal             +---------+------------------+-----+----------+--------+ +---------+------------------+-----+---------+-------+  Left      Lt Pressure (mmHg) Index Waveform  Comment  +---------+------------------+-----+---------+-------+  Brachial  124                      triphasic          +---------+------------------+-----+---------+-------+  PTA       92                 0.74  biphasic           +---------+------------------+-----+---------+-------+  DP        102                0.82  biphasic           +---------+------------------+-----+---------+-------+  Great Toe 80                 0.65  Abnormal           +---------+------------------+-----+---------+-------+ +-------+---------------+----------------+  ABI/TBI Today's ABI/TBI Previous ABI/TBI  +-------+---------------+----------------+  Right   0.65/0.40       0.92/0.46         +-------+---------------+----------------+  Left    0.82/0.65       0.94/0.48         +-------+---------------+----------------+  Right Doppler Findings: +--------+--------+-----+---------+--------+  Site     Pressure Index Doppler   Comments  +--------+--------+-----+---------+--------+  Brachial 124            triphasic           +--------+--------+-----+---------+--------+  Radial                  triphasic           +--------+--------+-----+---------+--------+  Ulnar                   triphasic           +--------+--------+-----+---------+--------+  Left Doppler Findings: +--------+--------+-----+---------+--------+  Site     Pressure Index Doppler   Comments   +--------+--------+-----+---------+--------+  Brachial 124            triphasic           +--------+--------+-----+---------+--------+  Radial                  triphasic           +--------+--------+-----+---------+--------+  Ulnar                   triphasic           +--------+--------+-----+---------+--------+  Summary: Right Carotid: Velocities in the right ICA are consistent with a 40-59%                stenosis. Left Carotid: Velocities in the left ICA are consistent with a 1-39% stenosis. Vertebrals:  Bilateral vertebral arteries demonstrate antegrade flow. Subclavians: Normal flow hemodynamics were seen in bilateral subclavian              arteries. Right ABI: Resting right ankle-brachial index indicates moderate right lower extremity arterial disease. The right toe-brachial index is abnormal. Right ABIs appear decreased as compared to previous examination on 10-25-2017. Left ABI: Resting left ankle-brachial index indicates mild left lower extremity arterial disease. The left toe-brachial index is abnormal. Left ABIs appear essentially unchanged as compared to previous examination on 10-25-2017. Right Upper Extremity: Doppler waveform obliterate with right radial compression. Doppler waveforms remain within normal limits with right ulnar compression. Left Upper Extremity: Doppler waveform obliterate with left radial compression. Doppler waveforms remain within normal limits with left ulnar compression.  Electronically signed by Orlie Pollen on 07/28/2021 at 6:59:27 PM.    Final        LOS: 2 days   [facility] Pager on [email or web address]  07/29/2021, 9:39 AM

## 2021-07-29 NOTE — Progress Notes (Signed)
ANTICOAGULATION CONSULT NOTE  Pharmacy Consult for heparin Indication: chest pain/ACS  Heparin Dosing Weight: 68 kg  Labs: Recent Labs    07/27/21 0025 07/27/21 0028 07/27/21 0225 07/27/21 0530 07/27/21 0928 07/28/21 0301 07/28/21 1219 07/29/21 0254  HGB 9.8* 9.9*  --   --   --  9.3*  --  9.4*  HCT 28.9* 29.0*  --   --   --  27.5*  --  27.8*  PLT 144*  --   --   --   --  121*  --  130*  APTT 26  --   --   --   --   --   --   --   LABPROT 13.5  --   --   --   --   --   --   --   INR 1.0  --   --   --   --   --   --   --   HEPARINUNFRC  --   --   --   --   --  0.28* 0.38 0.33  CREATININE 1.29* 1.20  --   --   --  1.30*  --  1.22  TROPONINIHS 9  --  61* 993* 5,799*  --   --   --      Estimated Creatinine Clearance: 37.1 mL/min (by C-G formula based on SCr of 1.22 mg/dL).  Assessment: Danny [name] presenting with CP, elevated and increasing high-sensitivity troponin. Pharmacy consulted to dose heparin. Patient is not on anticoagulation PTA.   Underwent cardiac cath, finding diffuse 3 vessel CAD, plan for CABG next week. Restarted IV heparin post cath. Currently on heparin at goal on 850 units/hr. Heparin level is therapeutic at 0.33. No s/s of bleeding noted per RN.  Goal of Therapy:  Heparin level 0.3-0.7 units/ml Monitor platelets by anticoagulation protocol: Yes   Plan:  Continue heparin infusion at 900 units/hr to keep within range Monitor daily CBC, s/sx bleeding  Erin Hearing PharmD., BCPS Clinical Pharmacist 07/29/2021 7:42 AM

## 2021-07-29 NOTE — H&P (View-Only) (Signed)
2 Days Post-Op Procedure(s) (LRB): LEFT HEART CATH AND CORONARY ANGIOGRAPHY (N/A) Subjective: No chest pain or shortness of breath. He has been ambulating.  Objective: Vital signs in last 24 hours: Temp:  [98.5 F (36.9 C)] 98.5 F (36.9 C) (01/20 0451) Pulse Rate:  [67-73] 73 (01/20 0451) Cardiac Rhythm: Normal sinus rhythm;Bundle branch block (01/20 0740) Resp:  [16] 16 (01/19 1947) BP: (108-151)/(57-64) 151/64 (01/20 0451) SpO2:  [95 %-100 %] 95 % (01/20 0451) Weight:  [72 kg] 69 kg (01/20 0451)  Hemodynamic parameters for last 24 hours:    Intake/Output from previous day: 01/19 0701 - 01/20 0700 In: 330 [P.O.:330] Out: 400 [Urine:400] Intake/Output this shift: No intake/output data recorded.  General appearance: alert and cooperative Neurologic: intact Heart: regular rate and rhythm, S1, S2 normal, 2/6 systolic murmur RSB. Lungs: clear to auscultation bilaterally Extremities: no edema  Lab Results: Recent Labs    07/28/21 0301 07/29/21 0254  WBC 7.1 9.9  HGB 9.3* 9.4*  HCT 27.5* 27.8*  PLT 121* 130*   BMET:  Recent Labs    07/28/21 0301 07/29/21 0254  NA 136 137  K 4.0 4.9  CL 108 106  CO2 21* 24  GLUCOSE 115* 115*  BUN 28* 33*  CREATININE 1.30* 1.22  CALCIUM 8.5* 8.9    PT/INR:  Recent Labs    07/27/21 0025  LABPROT 13.5  INR 1.0   ABG    Component Value Date/Time   TCO2 20 (L) 07/27/2021 0028   CBG (last 3)  Recent Labs    07/28/21 1608  GLUCAP 162*    Assessment/Plan:  Severe 3 vessel CAD. 2D echo shows mild AS with mean gradient 9 mm Hg and AVA 2 cm2, normal LVEF. Aortic valve does not require replacement at this time but will require continued follow up. Plan CABG on Monday am. He has no further questions.  LOS: 2 days    Gaye Pollack 07/29/2021

## 2021-07-29 NOTE — Care Management Important Message (Signed)
Important Message  Patient Details  Name: Danny [name] MRN: [id number] Date of Birth: 1934/01/08   Medicare Important Message Given:  Yes     Shelda Altes 07/29/2021, 10:30 AM

## 2021-07-29 NOTE — Progress Notes (Signed)
2 Days Post-Op Procedure(s) (LRB): LEFT HEART CATH AND CORONARY ANGIOGRAPHY (N/A) Subjective: No chest pain or shortness of breath. He has been ambulating.  Objective: Vital signs in last 24 hours: Temp:  [98.5 F (36.9 C)] 98.5 F (36.9 C) (01/20 0451) Pulse Rate:  [67-73] 73 (01/20 0451) Cardiac Rhythm: Normal sinus rhythm;Bundle branch block (01/20 0740) Resp:  [16] 16 (01/19 1947) BP: (108-151)/(57-64) 151/64 (01/20 0451) SpO2:  [95 %-100 %] 95 % (01/20 0451) Weight:  [50 kg] 69 kg (01/20 0451)  Hemodynamic parameters for last 24 hours:    Intake/Output from previous day: 01/19 0701 - 01/20 0700 In: 330 [P.O.:330] Out: 400 [Urine:400] Intake/Output this shift: No intake/output data recorded.  General appearance: alert and cooperative Neurologic: intact Heart: regular rate and rhythm, S1, S2 normal, 2/6 systolic murmur RSB. Lungs: clear to auscultation bilaterally Extremities: no edema  Lab Results: Recent Labs    07/28/21 0301 07/29/21 0254  WBC 7.1 9.9  HGB 9.3* 9.4*  HCT 27.5* 27.8*  PLT 121* 130*   BMET:  Recent Labs    07/28/21 0301 07/29/21 0254  NA 136 137  K 4.0 4.9  CL 108 106  CO2 21* 24  GLUCOSE 115* 115*  BUN 28* 33*  CREATININE 1.30* 1.22  CALCIUM 8.5* 8.9    PT/INR:  Recent Labs    07/27/21 0025  LABPROT 13.5  INR 1.0   ABG    Component Value Date/Time   TCO2 20 (L) 07/27/2021 0028   CBG (last 3)  Recent Labs    07/28/21 1608  GLUCAP 162*    Assessment/Plan:  Severe 3 vessel CAD. 2D echo shows mild AS with mean gradient 9 mm Hg and AVA 2 cm2, normal LVEF. Aortic valve does not require replacement at this time but will require continued follow up. Plan CABG on Monday am. He has no further questions.  LOS: 2 days    Gaye Pollack 07/29/2021

## 2021-07-29 NOTE — Progress Notes (Addendum)
Progress Note  Patient Name: Danny [name] Date of Encounter: 07/29/2021  [facility] Cardiologist: Mertie Moores, MD   Subjective   No complaints.   Inpatient Medications    Scheduled Meds:  aspirin EC  81 mg Oral Daily   rosuvastatin  20 mg Oral Daily   sodium chloride flush  3 mL Intravenous Q12H   vitamin B-12  1,000 mcg Oral Daily   Continuous Infusions:  sodium chloride 20 mL/hr at 07/27/21 1304   sodium chloride     heparin 900 Units/hr (07/29/21 0853)   PRN Meds: sodium chloride, acetaminophen, nitroGLYCERIN, ondansetron (ZOFRAN) IV, sodium chloride flush   Vital Signs    Vitals:   07/28/21 0438 07/28/21 1156 07/28/21 1947 07/29/21 0451  BP: (!) 103/58 117/60 (!) 108/57 (!) 151/64  Pulse: 65 61 67 73  Resp: 16 16 16    Temp: 98.3 F (36.8 C) 97.9 F (36.6 C) 98.5 F (36.9 C) 98.5 F (36.9 C)  TempSrc: Oral Oral Oral Oral  SpO2: 97%  100% 95%  Weight:    69 kg  Height:        Intake/Output Summary (Last 24 hours) at 07/29/2021 0926 Last data filed at 07/28/2021 1838 Gross per 24 hour  Intake 330 ml  Output 400 ml  Net -70 ml   Last 3 Weights 07/29/2021 07/28/2021 07/27/2021  Weight (lbs) 152 lb 3.2 oz 152 lb 8 oz 150 lb  Weight (kg) 69.037 kg 69.174 kg 68.04 kg      Telemetry    SR - Personally Reviewed  ECG    No new tracing  Physical Exam   GEN: No acute distress.   Neck: No JVD Cardiac: RRR, no murmurs, rubs, or gallops.  Respiratory: Clear to auscultation bilaterally. GI: Soft, nontender, non-distended  MS: No edema; No deformity. Neuro:  Nonfocal  Psych: Normal affect   Labs    High Sensitivity Troponin:   Recent Labs  Lab 07/27/21 0025 07/27/21 0225 07/27/21 0530 07/27/21 0928  TROPONINIHS 9 61* 993* 5,799*     Chemistry Recent Labs  Lab 07/27/21 0025 07/27/21 0028 07/27/21 1004 07/28/21 0301 07/29/21 0254  NA 135 138  --  136 137  K 3.8 3.8  --  4.0 4.9  CL 107 105  --  108 106  CO2 19*  --   --  21* 24   GLUCOSE 117* 114*  --  115* 115*  BUN 28* 26*  --  28* 33*  CREATININE 1.29* 1.20  --  1.30* 1.22  CALCIUM 8.0*  --   --  8.5* 8.9  MG  --   --  1.6*  --  1.9  PROT 5.6*  --   --   --   --   ALBUMIN 3.2*  --   --   --   --   AST 22  --   --   --   --   ALT 17  --   --   --   --   ALKPHOS 50  --   --   --   --   BILITOT 0.3  --   --   --   --   GFRNONAA 54*  --   --  54* 58*  ANIONGAP 9  --   --  7 7    Lipids  Recent Labs  Lab 07/28/21 0301  CHOL 162  TRIG 54  HDL 57  LDLCALC 94  CHOLHDL 2.8    Hematology  Recent Labs  Lab 07/27/21 0025 07/27/21 0028 07/27/21 1824 07/28/21 0301 07/29/21 0254  WBC 5.4  --   --  7.1 9.9  RBC 2.87*  --  2.73* 2.72* 2.78*  HGB 9.8* 9.9*  --  9.3* 9.4*  HCT 28.9* 29.0*  --  27.5* 27.8*  MCV 100.7*  --   --  101.1* 100.0  MCH 34.1*  --   --  34.2* 33.8  MCHC 33.9  --   --  33.8 33.8  RDW 13.6  --   --  14.0 13.9  PLT 144*  --   --  121* 130*   Thyroid  Recent Labs  Lab 07/27/21 1824  TSH 1.029    BNP Recent Labs  Lab 07/27/21 0026  BNP 51.3    DDimer No results for input(s): DDIMER in the last 168 hours.   Radiology    CARDIAC CATHETERIZATION  Addendum Date: 07/27/2021     Prox RCA lesion is 100% stenosed.   1st Mrg lesion is 90% stenosed.   Mid Cx to Dist Cx lesion is 90% stenosed.   1st Diag lesion is 95% stenosed.   2nd Diag-1 lesion is 80% stenosed.   2nd Diag-2 lesion is 85% stenosed.   Mid LAD lesion is 85% stenosed.   There is mild left ventricular systolic dysfunction.   LV end diastolic pressure is normal. 1.  Moderately calcified coronary arteries with severe three-vessel coronary artery disease.  The culprit for myocardial infarction is likely occluded proximal right coronary artery.  The onset of occlusion occlusion is likely more than 12 hours with no chest pain. 2.  Mildly reduced LV systolic function with an EF of 40 to 45% with normal left ventricular end-diastolic pressure.  There is evidence of basal to mid  inferior wall hypokinesis. Recommendations: Given diffuse three-vessel coronary artery disease, I think the best option for revascularization is CABG. Resume heparin in 4 hours at 7 PM. Multivessel PCI to the LAD, OM1 and left circumflex is also possible if the patient is deemed to be too high risk for CABG.  Result Date: 07/27/2021   Prox RCA lesion is 100% stenosed.   1st Mrg lesion is 90% stenosed.   Mid Cx to Dist Cx lesion is 90% stenosed.   1st Diag lesion is 95% stenosed.   2nd Diag-1 lesion is 80% stenosed.   2nd Diag-2 lesion is 85% stenosed.   Mid LAD lesion is 85% stenosed.   There is mild left ventricular systolic dysfunction.   LV end diastolic pressure is normal. 1.  Moderately calcified coronary arteries with severe three-vessel coronary artery disease.  The culprit for myocardial infarction is likely occluded proximal right coronary artery.  The onset of occlusion occlusion is likely more than 12 hours with no chest pain. 2.  Mildly reduced LV systolic function with an EF of 40 to 45% with normal left ventricular end-diastolic pressure.  There is evidence of basal to mid inferior wall hypokinesis. Recommendations: Given diffuse three-vessel coronary artery disease, I think the best option for revascularization is CABG. Resume heparin in 4 hours at 7 PM.   ECHOCARDIOGRAM COMPLETE  Result Date: 07/28/2021    ECHOCARDIOGRAM REPORT   Patient Name:   Danny [name] Date of Exam: 07/28/2021 Medical Rec #:  [id number]      Height:       64.0 in Accession #:    [id number]     Weight:       152.5 lb Date of  Birth:  February 10, 1934     BSA:          1.743 m Patient Age:    88 years       BP:           103/58 mmHg Patient Gender: M              HR:           66 bpm. Exam Location:  Inpatient Procedure: 2D Echo Indications:    Nstemi  History:        Patient has prior history of Echocardiogram examinations, most                 recent 09/24/2018. Risk Factors:Hypertension.  Sonographer:    Jefferey Pica  Referring Phys: [id number] KDTOIZTI A ARIDA  Sonographer Comments: Image acquisition challenging due to respiratory motion. IMPRESSIONS  1. Akinesis of the basal inferior wall with overall preserved LV function; calcified aortic valve with very mild AS (mean gradient 9 mmHg).  2. Left ventricular ejection fraction, by estimation, is 55 to 60%. The left ventricle has normal function. The left ventricle demonstrates regional wall motion abnormalities (see scoring diagram/findings for description). Left ventricular diastolic parameters are consistent with Grade I diastolic dysfunction (impaired relaxation).  3. Right ventricular systolic function is normal. The right ventricular size is normal. There is normal pulmonary artery systolic pressure.  4. The mitral valve is normal in structure. Trivial mitral valve regurgitation. No evidence of mitral stenosis.  5. The aortic valve is calcified. Aortic valve regurgitation is mild. No aortic stenosis is present.  6. Aortic dilatation noted. There is borderline dilatation of the aortic root, measuring 39 mm.  7. The inferior vena cava is normal in size with greater than 50% respiratory variability, suggesting right atrial pressure of 3 mmHg. Comparison(s): Prior images unable to be directly viewed, comparison made by report only. FINDINGS  Left Ventricle: Left ventricular ejection fraction, by estimation, is 55 to 60%. The left ventricle has normal function. The left ventricle demonstrates regional wall motion abnormalities. The left ventricular internal cavity size was normal in size. There is no left ventricular hypertrophy. Left ventricular diastolic parameters are consistent with Grade I diastolic dysfunction (impaired relaxation). Right Ventricle: The right ventricular size is normal. Right ventricular systolic function is normal. There is normal pulmonary artery systolic pressure. The tricuspid regurgitant velocity is 1.89 m/s, and with an assumed right atrial pressure of 15  mmHg, the estimated right ventricular systolic pressure is 45.8 mmHg. Left Atrium: Left atrial size was normal in size. Right Atrium: Right atrial size was normal in size. Pericardium: There is no evidence of pericardial effusion. Mitral Valve: The mitral valve is normal in structure. Mild mitral annular calcification. Trivial mitral valve regurgitation. No evidence of mitral valve stenosis. Tricuspid Valve: The tricuspid valve is normal in structure. Tricuspid valve regurgitation is mild . No evidence of tricuspid stenosis. Aortic Valve: The aortic valve is calcified. Aortic valve regurgitation is mild. Aortic regurgitation PHT measures 697 msec. No aortic stenosis is present. Aortic valve mean gradient measures 9.0 mmHg. Aortic valve peak gradient measures 17.7 mmHg. Aortic valve area, by VTI measures 2.20 cm. Pulmonic Valve: The pulmonic valve was normal in structure. Pulmonic valve regurgitation is not visualized. No evidence of pulmonic stenosis. Aorta: Aortic dilatation noted. There is borderline dilatation of the aortic root, measuring 39 mm. Venous: The inferior vena cava is normal in size with greater than 50% respiratory variability, suggesting right atrial pressure of 3 mmHg.  IAS/Shunts: The interatrial septum is aneurysmal. No atrial level shunt detected by color flow Doppler. Additional Comments: Akinesis of the basal inferior wall with overall preserved LV function; calcified aortic valve with very mild AS (mean gradient 9 mmHg).  LEFT VENTRICLE PLAX 2D LVIDd:         4.50 cm   Diastology LVIDs:         2.80 cm   LV e' medial:    5.74 cm/s LV PW:         0.90 cm   LV E/e' medial:  8.9 LV IVS:        1.00 cm   LV e' lateral:   8.59 cm/s LVOT diam:     2.00 cm   LV E/e' lateral: 5.9 LV SV:         102 LV SV Index:   59 LVOT Area:     3.14 cm  RIGHT VENTRICLE             IVC RV Basal diam:  2.50 cm     IVC diam: 2.20 cm RV S prime:     10.30 cm/s TAPSE (M-mode): 1.3 cm LEFT ATRIUM             Index         RIGHT ATRIUM           Index LA diam:        3.40 cm 1.95 cm/m   RA Area:     12.90 cm LA Vol (A2C):   48.8 ml 27.99 ml/m  RA Volume:   30.60 ml  17.55 ml/m LA Vol (A4C):   37.1 ml 21.28 ml/m LA Biplane Vol: 43.0 ml 24.66 ml/m  AORTIC VALVE                     PULMONIC VALVE AV Area (Vmax):    2.13 cm      PV Vmax:       0.57 m/s AV Area (Vmean):   2.14 cm      PV Peak grad:  1.3 mmHg AV Area (VTI):     2.20 cm AV Vmax:           210.50 cm/s AV Vmean:          140.500 cm/s AV VTI:            0.465 m AV Peak Grad:      17.7 mmHg AV Mean Grad:      9.0 mmHg LVOT Vmax:         143.00 cm/s LVOT Vmean:        95.600 cm/s LVOT VTI:          0.325 m LVOT/AV VTI ratio: 0.70 AI PHT:            697 msec  AORTA Ao Root diam: 3.90 cm Ao Asc diam:  3.40 cm MITRAL VALVE                TRICUSPID VALVE MV Area (PHT): 4.04 cm     TR Peak grad:   14.3 mmHg MV Decel Time: 188 msec     TR Vmax:        189.00 cm/s MV E velocity: 51.00 cm/s MV A velocity: 102.00 cm/s  SHUNTS MV E/A ratio:  0.50         Systemic VTI:  0.32 m  Systemic Diam: 2.00 cm Kirk Ruths MD Electronically signed by Kirk Ruths MD Signature Date/Time: 07/28/2021/1:07:43 PM    Final    VAS US DOPPLER PRE CABG  Result Date: 07/28/2021 PREOPERATIVE VASCULAR EVALUATION Patient Name:  Danny [name]  Date of Exam:   07/28/2021 Medical Rec #: [id number]       Accession #:    [id number] Date of Birth: [birth year]      Patient Gender: M Patient Age:   53 years Exam Location:  [facility] Procedure:      VAS US DOPPLER PRE CABG Referring Phys: Gilford Raid --------------------------------------------------------------------------------  Indications:      Pre-CABG. Risk Factors:     Hypertension, hyperlipidemia, past history of smoking, prior                   MI. Limitations:      Involuntary patient movement of both hands and feet Comparison Study: 10-25-2017 Lower extremity arterial studies showed mild                    arterial disease bilaterally. ABI 0.92/0.46 RT and 0.94/0.48                   LT. Performing Technologist: Darlin Coco RDMS RVT  Examination Guidelines: A complete evaluation includes B-mode imaging, spectral Doppler, color Doppler, and power Doppler as needed of all accessible portions of each vessel. Bilateral testing is considered an integral part of a complete examination. Limited examinations for reoccurring indications may be performed as noted.  Right Carotid Findings: +----------+--------+--------+--------+---------------------+------------------+             PSV cm/s EDV cm/s Stenosis Describe              Comments            +----------+--------+--------+--------+---------------------+------------------+  CCA Prox   60       14                                                          +----------+--------+--------+--------+---------------------+------------------+  CCA Distal 61       18                                      intimal thickening  +----------+--------+--------+--------+---------------------+------------------+  ICA Prox   130      47       40-59%   heterogenous,                                                                    hypoechoic and smooth                     +----------+--------+--------+--------+---------------------+------------------+  ICA Mid    129      50  tortuous            +----------+--------+--------+--------+---------------------+------------------+  ICA Distal 63       21                                      tortuous            +----------+--------+--------+--------+---------------------+------------------+  ECA        90       10                                                          +----------+--------+--------+--------+---------------------+------------------+ +----------+--------+-------+----------------+------------+             PSV cm/s EDV cms Describe         Arm Pressure   +----------+--------+-------+----------------+------------+  Subclavian 71               Multiphasic, WNL               +----------+--------+-------+----------------+------------+ +---------+--------+--+--------+--+---------+  Vertebral PSV cm/s 59 EDV cm/s 16 Antegrade  +---------+--------+--+--------+--+---------+ Left Carotid Findings: +----------+--------+--------+--------+--------+--------+             PSV cm/s EDV cm/s Stenosis Describe Comments  +----------+--------+--------+--------+--------+--------+  CCA Prox   73       21                                   +----------+--------+--------+--------+--------+--------+  CCA Distal 71       25                                   +----------+--------+--------+--------+--------+--------+  ICA Prox   94       33       1-39%    calcific           +----------+--------+--------+--------+--------+--------+  ICA Distal 76       26                                   +----------+--------+--------+--------+--------+--------+  ECA        65       11                                   +----------+--------+--------+--------+--------+--------+ +----------+--------+--------+----------------+------------+  Subclavian PSV cm/s EDV cm/s Describe         Arm Pressure  +----------+--------+--------+----------------+------------+             104               Multiphasic, WNL               +----------+--------+--------+----------------+------------+ +---------+--------+--+--------+--+---------+  Vertebral PSV cm/s 53 EDV cm/s 22 Antegrade  +---------+--------+--+--------+--+---------+  ABI Findings: +---------+------------------+-----+----------+--------+  Right     Rt Pressure (mmHg) Index Waveform   Comment   +---------+------------------+-----+----------+--------+  Brachial  124  triphasic            +---------+------------------+-----+----------+--------+  PTA       66                 0.53  monophasic            +---------+------------------+-----+----------+--------+  DP        81                 0.65  biphasic             +---------+------------------+-----+----------+--------+  Great Toe 50                 0.40  Abnormal             +---------+------------------+-----+----------+--------+ +---------+------------------+-----+---------+-------+  Left      Lt Pressure (mmHg) Index Waveform  Comment  +---------+------------------+-----+---------+-------+  Brachial  124                      triphasic          +---------+------------------+-----+---------+-------+  PTA       92                 0.74  biphasic           +---------+------------------+-----+---------+-------+  DP        102                0.82  biphasic           +---------+------------------+-----+---------+-------+  Great Toe 80                 0.65  Abnormal           +---------+------------------+-----+---------+-------+ +-------+---------------+----------------+  ABI/TBI Today's ABI/TBI Previous ABI/TBI  +-------+---------------+----------------+  Right   0.65/0.40       0.92/0.46         +-------+---------------+----------------+  Left    0.82/0.65       0.94/0.48         +-------+---------------+----------------+  Right Doppler Findings: +--------+--------+-----+---------+--------+  Site     Pressure Index Doppler   Comments  +--------+--------+-----+---------+--------+  Brachial 124            triphasic           +--------+--------+-----+---------+--------+  Radial                  triphasic           +--------+--------+-----+---------+--------+  Ulnar                   triphasic           +--------+--------+-----+---------+--------+  Left Doppler Findings: +--------+--------+-----+---------+--------+  Site     Pressure Index Doppler   Comments  +--------+--------+-----+---------+--------+  Brachial 124            triphasic           +--------+--------+-----+---------+--------+  Radial                  triphasic            +--------+--------+-----+---------+--------+  Ulnar                   triphasic           +--------+--------+-----+---------+--------+  Summary: Right Carotid: Velocities in the right ICA are consistent with a 40-59%                stenosis. Left Carotid: Velocities in the  left ICA are consistent with a 1-39% stenosis. Vertebrals:  Bilateral vertebral arteries demonstrate antegrade flow. Subclavians: Normal flow hemodynamics were seen in bilateral subclavian              arteries. Right ABI: Resting right ankle-brachial index indicates moderate right lower extremity arterial disease. The right toe-brachial index is abnormal. Right ABIs appear decreased as compared to previous examination on 10-25-2017. Left ABI: Resting left ankle-brachial index indicates mild left lower extremity arterial disease. The left toe-brachial index is abnormal. Left ABIs appear essentially unchanged as compared to previous examination on 10-25-2017. Right Upper Extremity: Doppler waveform obliterate with right radial compression. Doppler waveforms remain within normal limits with right ulnar compression. Left Upper Extremity: Doppler waveform obliterate with left radial compression. Doppler waveforms remain within normal limits with left ulnar compression.  Electronically signed by Orlie Pollen on 07/28/2021 at 6:59:27 PM.    Final     Cardiac Studies   Cardiac catheterization (07/27/2021)     Prox RCA lesion is 100% stenosed.   1st Mrg lesion is 90% stenosed.   Mid Cx to Dist Cx lesion is 90% stenosed.   1st Diag lesion is 95% stenosed.   2nd Diag-1 lesion is 80% stenosed.   2nd Diag-2 lesion is 85% stenosed.   Mid LAD lesion is 85% stenosed.   There is mild left ventricular systolic dysfunction.   LV end diastolic pressure is normal.   1.  Moderately calcified coronary arteries with severe three-vessel coronary artery disease.  The culprit for myocardial infarction is likely occluded proximal right coronary artery.  The  onset of occlusion occlusion is likely more than 12 hours with no chest pain. 2.  Mildly reduced LV systolic function with an EF of 40 to 45% with normal left ventricular end-diastolic pressure.  There is evidence of basal to mid inferior wall hypokinesis.   Recommendations: Given diffuse three-vessel coronary artery disease, I think the best option for revascularization is CABG. Resume heparin in 4 hours at 7 PM.   Coronary Diagrams   Diagnostic Dominance: Right   Echo: 07/28/2021  IMPRESSIONS     1. Akinesis of the basal inferior wall with overall preserved LV  function; calcified aortic valve with very mild AS (mean gradient 9 mmHg).   2. Left ventricular ejection fraction, by estimation, is 55 to 60%. The  left ventricle has normal function. The left ventricle demonstrates  regional wall motion abnormalities (see scoring diagram/findings for  description). Left ventricular diastolic  parameters are consistent with Grade I diastolic dysfunction (impaired  relaxation).   3. Right ventricular systolic function is normal. The right ventricular  size is normal. There is normal pulmonary artery systolic pressure.   4. The mitral valve is normal in structure. Trivial mitral valve  regurgitation. No evidence of mitral stenosis.   5. The aortic valve is calcified. Aortic valve regurgitation is mild. No  aortic stenosis is present.   6. Aortic dilatation noted. There is borderline dilatation of the aortic  root, measuring 39 mm.   7. The inferior vena cava is normal in size with greater than 50%  respiratory variability, suggesting right atrial pressure of 3 mmHg.   Comparison(s): Prior images unable to be directly viewed, comparison made  by report only.   FINDINGS   Left Ventricle: Left ventricular ejection fraction, by estimation, is 55  to 60%. The left ventricle has normal function. The left ventricle  demonstrates regional wall motion abnormalities. The left ventricular   internal cavity size  was normal in size.  There is no left ventricular hypertrophy. Left ventricular diastolic  parameters are consistent with Grade I diastolic dysfunction (impaired  relaxation).   Right Ventricle: The right ventricular size is normal. Right ventricular  systolic function is normal. There is normal pulmonary artery systolic  pressure. The tricuspid regurgitant velocity is 1.89 m/s, and with an  assumed right atrial pressure of 15  mmHg, the estimated right ventricular systolic pressure is 71.2 mmHg.   Left Atrium: Left atrial size was normal in size.   Right Atrium: Right atrial size was normal in size.   Pericardium: There is no evidence of pericardial effusion.   Mitral Valve: The mitral valve is normal in structure. Mild mitral annular  calcification. Trivial mitral valve regurgitation. No evidence of mitral  valve stenosis.   Tricuspid Valve: The tricuspid valve is normal in structure. Tricuspid  valve regurgitation is mild . No evidence of tricuspid stenosis.   Aortic Valve: The aortic valve is calcified. Aortic valve regurgitation is  mild. Aortic regurgitation PHT measures 697 msec. No aortic stenosis is  present. Aortic valve mean gradient measures 9.0 mmHg. Aortic valve peak  gradient measures 17.7 mmHg.  Aortic valve area, by VTI measures 2.20 cm.   Pulmonic Valve: The pulmonic valve was normal in structure. Pulmonic valve  regurgitation is not visualized. No evidence of pulmonic stenosis.   Aorta: Aortic dilatation noted. There is borderline dilatation of the  aortic root, measuring 39 mm.   Venous: The inferior vena cava is normal in size with greater than 50%  respiratory variability, suggesting right atrial pressure of 3 mmHg.   IAS/Shunts: The interatrial septum is aneurysmal. No atrial level shunt  detected by color flow Doppler.   Additional Comments: Akinesis of the basal inferior wall with overall  preserved LV function; calcified  aortic valve with very mild AS (mean  gradient 9 mmHg).   Patient Profile     88 y.o. male with a hx of HTN, HLD, no hx of CAD  who is being seen 07/27/2021 for the evaluation of elevated troponins at the request of Dr. Myna Hidalgo. Found to have multivessel CAD, now with plans for CABG.   Assessment & Plan    NSTEMI: hsTn peaked at 5799. Underwent cardiac cath noted above with 3v CAD. Seen by [facility] with plans for CABG on Monday. No recurrent chest pain.  -- remains on IV heparin, ASA, statin   HTN: blood pressures stable but soft at times.  -- ideally add low dose BB therapy, maybe later today pending readings   HLD: LDL 94 -- on Crestor 20mg  daily, hx of statin intolerance   Anemia: Hgb noted in the 9 range. FOBT negative. No reports of bleeding -- management per primary  Thrombocytopenia: platelets 121>>130 -- follow while on IV heparin  Seizure Activity: initially presented with shaking episode. MRI did not show acute CVA. Etiology unclear. No further episodes.    For questions or updates, please contact [place] Please consult [email or web address] for contact info under        Signed, Reino Bellis, NP  07/29/2021, 9:26 AM    Agree with note by Reino Bellis NP-C  Mr. Boudoin  was admitted with a non-STEMI.  Catheterization showed surgical disease.  He has been asymptomatic on IV heparin.  Plan is CABG on Monday.  Lorretta Harp, M.D., [place], [facility], [place] [address]. [place], [place]  [address]  [phone] 07/29/2021 12:58 PM

## 2021-07-30 DIAGNOSIS — I350 Nonrheumatic aortic (valve) stenosis: Secondary | ICD-10-CM

## 2021-07-30 LAB — HEPARIN LEVEL (UNFRACTIONATED)
Heparin Unfractionated: 0.25 IU/mL — ABNORMAL LOW (ref 0.30–0.70)
Heparin Unfractionated: 0.19 IU/mL — ABNORMAL LOW (ref 0.30–0.70)
Heparin Unfractionated: 0.2 IU/mL — ABNORMAL LOW (ref 0.30–0.70)

## 2021-07-30 LAB — CBC
HCT: 29 % — ABNORMAL LOW (ref 39.0–52.0)
Hemoglobin: 9.5 g/dL — ABNORMAL LOW (ref 13.0–17.0)
MCH: 32.8 pg (ref 26.0–34.0)
MCHC: 32.8 g/dL (ref 30.0–36.0)
MCV: 100 fL (ref 80.0–100.0)
Platelets: 128 10*3/uL — ABNORMAL LOW (ref 150–400)
RBC: 2.9 MIL/uL — ABNORMAL LOW (ref 4.22–5.81)
RDW: 13.8 % (ref 11.5–15.5)
WBC: 9 10*3/uL (ref 4.0–10.5)
nRBC: 0 % (ref 0.0–0.2)

## 2021-07-30 MED ORDER — DOCUSATE SODIUM 100 MG PO CAPS
100.0000 mg | ORAL_CAPSULE | Freq: Every day | ORAL | Status: DC | PRN
  Administered 2021-07-30: 100 mg via ORAL
  Filled 2021-07-30: qty 1

## 2021-07-30 NOTE — Progress Notes (Signed)
Progress Note  Patient Name: Danny [name] Date of Encounter: 07/30/2021  [facility] Cardiologist: Mertie Moores, MD   Subjective   No angina   Inpatient Medications    Scheduled Meds:  aspirin EC  81 mg Oral Daily   [START ON 08/01/2021] epinephrine  0-10 mcg/min Intravenous To OR   [START ON 08/01/2021] heparin-papaverine-plasmalyte irrigation   Irrigation To OR   [START ON 08/01/2021] insulin   Intravenous To OR   [START ON 08/01/2021] magnesium sulfate  40 mEq Other To OR   [START ON 08/01/2021] phenylephrine  30-200 mcg/min Intravenous To OR   [START ON 08/01/2021] potassium chloride  80 mEq Other To OR   rosuvastatin  20 mg Oral Daily   sodium chloride flush  3 mL Intravenous Q12H   [START ON 08/01/2021] tranexamic acid  15 mg/kg Intravenous To OR   [START ON 08/01/2021] tranexamic acid  2 mg/kg Intracatheter To OR   vitamin B-12  1,000 mcg Oral Daily   Continuous Infusions:  sodium chloride 20 mL/hr at 07/27/21 1304   sodium chloride     [START ON 08/01/2021]  ceFAZolin (ANCEF) IV     [START ON 08/01/2021]  ceFAZolin (ANCEF) IV     [START ON 08/01/2021] dexmedetomidine     [START ON 08/01/2021] heparin 30,000 units/NS 1000 mL solution for CELLSAVER     heparin 950 Units/hr (07/30/21 0935)   [START ON 08/01/2021] milrinone     [START ON 08/01/2021] nitroGLYCERIN     [START ON 08/01/2021] norepinephrine     [START ON 08/01/2021] tranexamic acid (CYKLOKAPRON) infusion ([facility])     [START ON 08/01/2021] vancomycin     PRN Meds: sodium chloride, acetaminophen, nitroGLYCERIN, ondansetron (ZOFRAN) IV, sodium chloride flush   Vital Signs    Vitals:   07/29/21 0451 07/29/21 1330 07/29/21 2040 07/30/21 0611  BP: (!) 151/64 (!) 103/57 116/69 125/70  Pulse: 73 69 74 72  Resp:  17 18 18   Temp: 98.5 F (36.9 C) 98.1 F (36.7 C) 97.9 F (36.6 C) 98.4 F (36.9 C)  TempSrc: Oral Oral Oral Oral  SpO2: 95% 97% 95% 97%  Weight: 69 kg   69.7 kg  Height:        Intake/Output  Summary (Last 24 hours) at 07/30/2021 0940 Last data filed at 07/30/2021 0900 Gross per 24 hour  Intake 849.72 ml  Output 325 ml  Net 524.72 ml   Last 3 Weights 07/30/2021 07/29/2021 07/28/2021  Weight (lbs) 153 lb 10.6 oz 152 lb 3.2 oz 152 lb 8 oz  Weight (kg) 69.7 kg 69.037 kg 69.174 kg      Telemetry    SR - Personally Reviewed  ECG    No new tracing  Physical Exam   Affect appropriate Healthy:  appears stated age HEENT: normal Neck supple with no adenopathy JVP normal no bruits no thyromegaly Lungs clear with no wheezing and good diaphragmatic motion Heart:  S1/S2 mild AS  murmur, no rub, gallop or click PMI normal Abdomen: benighn, BS positve, no tenderness, no AAA no bruit.  No HSM or HJR Distal pulses intact with no bruits No edema Neuro non-focal Skin warm and dry No muscular weakness   Labs    High Sensitivity Troponin:   Recent Labs  Lab 07/27/21 0025 07/27/21 0225 07/27/21 0530 07/27/21 0928  TROPONINIHS 9 61* 993* 5,799*     Chemistry Recent Labs  Lab 07/27/21 0025 07/27/21 0028 07/27/21 1004 07/28/21 0301 07/29/21 0254  NA 135 138  --  136 137  K 3.8 3.8  --  4.0 4.9  CL 107 105  --  108 106  CO2 19*  --   --  21* 24  GLUCOSE 117* 114*  --  115* 115*  BUN 28* 26*  --  28* 33*  CREATININE 1.29* 1.20  --  1.30* 1.22  CALCIUM 8.0*  --   --  8.5* 8.9  MG  --   --  1.6*  --  1.9  PROT 5.6*  --   --   --   --   ALBUMIN 3.2*  --   --   --   --   AST 22  --   --   --   --   ALT 17  --   --   --   --   ALKPHOS 50  --   --   --   --   BILITOT 0.3  --   --   --   --   GFRNONAA 54*  --   --  54* 58*  ANIONGAP 9  --   --  7 7    Lipids  Recent Labs  Lab 07/28/21 0301  CHOL 162  TRIG 54  HDL 57  LDLCALC 94  CHOLHDL 2.8    Hematology Recent Labs  Lab 07/28/21 0301 07/29/21 0254 07/30/21 0251  WBC 7.1 9.9 9.0  RBC 2.72* 2.78* 2.90*  HGB 9.3* 9.4* 9.5*  HCT 27.5* 27.8* 29.0*  MCV 101.1* 100.0 100.0  MCH 34.2* 33.8 32.8  MCHC  33.8 33.8 32.8  RDW 14.0 13.9 13.8  PLT 121* 130* 128*   Thyroid  Recent Labs  Lab 07/27/21 1824  TSH 1.029    BNP Recent Labs  Lab 07/27/21 0026  BNP 51.3    DDimer No results for input(s): DDIMER in the last 168 hours.   Radiology    ECHOCARDIOGRAM COMPLETE  Result Date: 07/28/2021    ECHOCARDIOGRAM REPORT   Patient Name:   HRISTOPHER MISSILDINE Date of Exam: 07/28/2021 Medical Rec #:  [id number]      Height:       64.0 in Accession #:    [id number]     Weight:       152.5 lb Date of Birth:  [birth year]     BSA:          1.743 m Patient Age:    88 years       BP:           103/58 mmHg Patient Gender: M              HR:           66 bpm. Exam Location:  Inpatient Procedure: 2D Echo Indications:    Nstemi  History:        Patient has prior history of Echocardiogram examinations, most                 recent 09/24/2018. Risk Factors:Hypertension.  Sonographer:    Jefferey Pica Referring Phys: [id number] IRSWNIOE A ARIDA  Sonographer Comments: Image acquisition challenging due to respiratory motion. IMPRESSIONS  1. Akinesis of the basal inferior wall with overall preserved LV function; calcified aortic valve with very mild AS (mean gradient 9 mmHg).  2. Left ventricular ejection fraction, by estimation, is 55 to 60%. The left ventricle has normal function. The left ventricle demonstrates regional wall motion abnormalities (see scoring diagram/findings for description). Left ventricular diastolic parameters are consistent with Grade  I diastolic dysfunction (impaired relaxation).  3. Right ventricular systolic function is normal. The right ventricular size is normal. There is normal pulmonary artery systolic pressure.  4. The mitral valve is normal in structure. Trivial mitral valve regurgitation. No evidence of mitral stenosis.  5. The aortic valve is calcified. Aortic valve regurgitation is mild. No aortic stenosis is present.  6. Aortic dilatation noted. There is borderline dilatation of the aortic root,  measuring 39 mm.  7. The inferior vena cava is normal in size with greater than 50% respiratory variability, suggesting right atrial pressure of 3 mmHg. Comparison(s): Prior images unable to be directly viewed, comparison made by report only. FINDINGS  Left Ventricle: Left ventricular ejection fraction, by estimation, is 55 to 60%. The left ventricle has normal function. The left ventricle demonstrates regional wall motion abnormalities. The left ventricular internal cavity size was normal in size. There is no left ventricular hypertrophy. Left ventricular diastolic parameters are consistent with Grade I diastolic dysfunction (impaired relaxation). Right Ventricle: The right ventricular size is normal. Right ventricular systolic function is normal. There is normal pulmonary artery systolic pressure. The tricuspid regurgitant velocity is 1.89 m/s, and with an assumed right atrial pressure of 15 mmHg, the estimated right ventricular systolic pressure is 10.2 mmHg. Left Atrium: Left atrial size was normal in size. Right Atrium: Right atrial size was normal in size. Pericardium: There is no evidence of pericardial effusion. Mitral Valve: The mitral valve is normal in structure. Mild mitral annular calcification. Trivial mitral valve regurgitation. No evidence of mitral valve stenosis. Tricuspid Valve: The tricuspid valve is normal in structure. Tricuspid valve regurgitation is mild . No evidence of tricuspid stenosis. Aortic Valve: The aortic valve is calcified. Aortic valve regurgitation is mild. Aortic regurgitation PHT measures 697 msec. No aortic stenosis is present. Aortic valve mean gradient measures 9.0 mmHg. Aortic valve peak gradient measures 17.7 mmHg. Aortic valve area, by VTI measures 2.20 cm. Pulmonic Valve: The pulmonic valve was normal in structure. Pulmonic valve regurgitation is not visualized. No evidence of pulmonic stenosis. Aorta: Aortic dilatation noted. There is borderline dilatation of the  aortic root, measuring 39 mm. Venous: The inferior vena cava is normal in size with greater than 50% respiratory variability, suggesting right atrial pressure of 3 mmHg. IAS/Shunts: The interatrial septum is aneurysmal. No atrial level shunt detected by color flow Doppler. Additional Comments: Akinesis of the basal inferior wall with overall preserved LV function; calcified aortic valve with very mild AS (mean gradient 9 mmHg).  LEFT VENTRICLE PLAX 2D LVIDd:         4.50 cm   Diastology LVIDs:         2.80 cm   LV e' medial:    5.74 cm/s LV PW:         0.90 cm   LV E/e' medial:  8.9 LV IVS:        1.00 cm   LV e' lateral:   8.59 cm/s LVOT diam:     2.00 cm   LV E/e' lateral: 5.9 LV SV:         102 LV SV Index:   59 LVOT Area:     3.14 cm  RIGHT VENTRICLE             IVC RV Basal diam:  2.50 cm     IVC diam: 2.20 cm RV S prime:     10.30 cm/s TAPSE (M-mode): 1.3 cm LEFT ATRIUM  Index        RIGHT ATRIUM           Index LA diam:        3.40 cm 1.95 cm/m   RA Area:     12.90 cm LA Vol (A2C):   48.8 ml 27.99 ml/m  RA Volume:   30.60 ml  17.55 ml/m LA Vol (A4C):   37.1 ml 21.28 ml/m LA Biplane Vol: 43.0 ml 24.66 ml/m  AORTIC VALVE                     PULMONIC VALVE AV Area (Vmax):    2.13 cm      PV Vmax:       0.57 m/s AV Area (Vmean):   2.14 cm      PV Peak grad:  1.3 mmHg AV Area (VTI):     2.20 cm AV Vmax:           210.50 cm/s AV Vmean:          140.500 cm/s AV VTI:            0.465 m AV Peak Grad:      17.7 mmHg AV Mean Grad:      9.0 mmHg LVOT Vmax:         143.00 cm/s LVOT Vmean:        95.600 cm/s LVOT VTI:          0.325 m LVOT/AV VTI ratio: 0.70 AI PHT:            697 msec  AORTA Ao Root diam: 3.90 cm Ao Asc diam:  3.40 cm MITRAL VALVE                TRICUSPID VALVE MV Area (PHT): 4.04 cm     TR Peak grad:   14.3 mmHg MV Decel Time: 188 msec     TR Vmax:        189.00 cm/s MV E velocity: 51.00 cm/s MV A velocity: 102.00 cm/s  SHUNTS MV E/A ratio:  0.50         Systemic VTI:  0.32 m                              Systemic Diam: 2.00 cm Kirk Ruths MD Electronically signed by Kirk Ruths MD Signature Date/Time: 07/28/2021/1:07:43 PM    Final    VAS US DOPPLER PRE CABG  Result Date: 07/28/2021 PREOPERATIVE VASCULAR EVALUATION Patient Name:  CASTEN FLOREN  Date of Exam:   07/28/2021 Medical Rec #: [id number]       Accession #:    [id number] Date of Birth: 1933-08-24      Patient Gender: M Patient Age:   15 years Exam Location:  [facility] Procedure:      VAS US DOPPLER PRE CABG Referring Phys: Gilford Raid --------------------------------------------------------------------------------  Indications:      Pre-CABG. Risk Factors:     Hypertension, hyperlipidemia, past history of smoking, prior                   MI. Limitations:      Involuntary patient movement of both hands and feet Comparison Study: 10-25-2017 Lower extremity arterial studies showed mild                   arterial disease bilaterally. ABI 0.92/0.46 RT and 0.94/0.48  LT. Performing Technologist: Darlin Coco RDMS RVT  Examination Guidelines: A complete evaluation includes B-mode imaging, spectral Doppler, color Doppler, and power Doppler as needed of all accessible portions of each vessel. Bilateral testing is considered an integral part of a complete examination. Limited examinations for reoccurring indications may be performed as noted.  Right Carotid Findings: +----------+--------+--------+--------+---------------------+------------------+             PSV cm/s EDV cm/s Stenosis Describe              Comments            +----------+--------+--------+--------+---------------------+------------------+  CCA Prox   60       14                                                          +----------+--------+--------+--------+---------------------+------------------+  CCA Distal 61       18                                      intimal thickening   +----------+--------+--------+--------+---------------------+------------------+  ICA Prox   130      47       40-59%   heterogenous,                                                                    hypoechoic and smooth                     +----------+--------+--------+--------+---------------------+------------------+  ICA Mid    129      50                                      tortuous            +----------+--------+--------+--------+---------------------+------------------+  ICA Distal 63       21                                      tortuous            +----------+--------+--------+--------+---------------------+------------------+  ECA        90       10                                                          +----------+--------+--------+--------+---------------------+------------------+ +----------+--------+-------+----------------+------------+             PSV cm/s EDV cms Describe         Arm Pressure  +----------+--------+-------+----------------+------------+  Subclavian 71               Multiphasic, WNL               +----------+--------+-------+----------------+------------+ +---------+--------+--+--------+--+---------+  Vertebral PSV cm/s 59 EDV cm/s 16 Antegrade  +---------+--------+--+--------+--+---------+ Left Carotid Findings: +----------+--------+--------+--------+--------+--------+             PSV cm/s EDV cm/s Stenosis Describe Comments  +----------+--------+--------+--------+--------+--------+  CCA Prox   73       21                                   +----------+--------+--------+--------+--------+--------+  CCA Distal 71       25                                   +----------+--------+--------+--------+--------+--------+  ICA Prox   94       33       1-39%    calcific           +----------+--------+--------+--------+--------+--------+  ICA Distal 76       26                                   +----------+--------+--------+--------+--------+--------+  ECA        65       11                                    +----------+--------+--------+--------+--------+--------+ +----------+--------+--------+----------------+------------+  Subclavian PSV cm/s EDV cm/s Describe         Arm Pressure  +----------+--------+--------+----------------+------------+             104               Multiphasic, WNL               +----------+--------+--------+----------------+------------+ +---------+--------+--+--------+--+---------+  Vertebral PSV cm/s 53 EDV cm/s 22 Antegrade  +---------+--------+--+--------+--+---------+  ABI Findings: +---------+------------------+-----+----------+--------+  Right     Rt Pressure (mmHg) Index Waveform   Comment   +---------+------------------+-----+----------+--------+  Brachial  124                      triphasic            +---------+------------------+-----+----------+--------+  PTA       66                 0.53  monophasic           +---------+------------------+-----+----------+--------+  DP        81                 0.65  biphasic             +---------+------------------+-----+----------+--------+  Great Toe 50                 0.40  Abnormal             +---------+------------------+-----+----------+--------+ +---------+------------------+-----+---------+-------+  Left      Lt Pressure (mmHg) Index Waveform  Comment  +---------+------------------+-----+---------+-------+  Brachial  124                      triphasic          +---------+------------------+-----+---------+-------+  PTA       92                 0.74  biphasic           +---------+------------------+-----+---------+-------+  DP        102                0.82  biphasic           +---------+------------------+-----+---------+-------+  Great Toe 80                 0.65  Abnormal           +---------+------------------+-----+---------+-------+ +-------+---------------+----------------+  ABI/TBI Today's ABI/TBI Previous ABI/TBI  +-------+---------------+----------------+  Right   0.65/0.40       0.92/0.46          +-------+---------------+----------------+  Left    0.82/0.65       0.94/0.48         +-------+---------------+----------------+  Right Doppler Findings: +--------+--------+-----+---------+--------+  Site     Pressure Index Doppler   Comments  +--------+--------+-----+---------+--------+  Brachial 124            triphasic           +--------+--------+-----+---------+--------+  Radial                  triphasic           +--------+--------+-----+---------+--------+  Ulnar                   triphasic           +--------+--------+-----+---------+--------+  Left Doppler Findings: +--------+--------+-----+---------+--------+  Site     Pressure Index Doppler   Comments  +--------+--------+-----+---------+--------+  Brachial 124            triphasic           +--------+--------+-----+---------+--------+  Radial                  triphasic           +--------+--------+-----+---------+--------+  Ulnar                   triphasic           +--------+--------+-----+---------+--------+  Summary: Right Carotid: Velocities in the right ICA are consistent with a 40-59%                stenosis. Left Carotid: Velocities in the left ICA are consistent with a 1-39% stenosis. Vertebrals:  Bilateral vertebral arteries demonstrate antegrade flow. Subclavians: Normal flow hemodynamics were seen in bilateral subclavian              arteries. Right ABI: Resting right ankle-brachial index indicates moderate right lower extremity arterial disease. The right toe-brachial index is abnormal. Right ABIs appear decreased as compared to previous examination on 10-25-2017. Left ABI: Resting left ankle-brachial index indicates mild left lower extremity arterial disease. The left toe-brachial index is abnormal. Left ABIs appear essentially unchanged as compared to previous examination on 10-25-2017. Right Upper Extremity: Doppler waveform obliterate with right radial compression. Doppler waveforms remain within normal limits with right ulnar  compression. Left Upper Extremity: Doppler waveform obliterate with left radial compression. Doppler waveforms remain within normal limits with left ulnar compression.  Electronically signed by Orlie Pollen on 07/28/2021 at 6:59:27 PM.    Final     Cardiac Studies   Cardiac catheterization (07/27/2021)     Prox RCA lesion is 100% stenosed.   1st Mrg lesion is 90% stenosed.   Mid Cx to Dist Cx lesion is 90% stenosed.   1st Diag lesion is 95% stenosed.   2nd Diag-1 lesion is 80% stenosed.   2nd Diag-2 lesion is 85% stenosed.   Mid LAD  lesion is 85% stenosed.   There is mild left ventricular systolic dysfunction.   LV end diastolic pressure is normal.   1.  Moderately calcified coronary arteries with severe three-vessel coronary artery disease.  The culprit for myocardial infarction is likely occluded proximal right coronary artery.  The onset of occlusion occlusion is likely more than 12 hours with no chest pain. 2.  Mildly reduced LV systolic function with an EF of 40 to 45% with normal left ventricular end-diastolic pressure.  There is evidence of basal to mid inferior wall hypokinesis.   Recommendations: Given diffuse three-vessel coronary artery disease, I think the best option for revascularization is CABG. Resume heparin in 4 hours at 7 PM.   Coronary Diagrams   Diagnostic Dominance: Right   Echo: 07/28/2021  IMPRESSIONS     1. Akinesis of the basal inferior wall with overall preserved LV  function; calcified aortic valve with very mild AS (mean gradient 9 mmHg).   2. Left ventricular ejection fraction, by estimation, is 55 to 60%. The  left ventricle has normal function. The left ventricle demonstrates  regional wall motion abnormalities (see scoring diagram/findings for  description). Left ventricular diastolic  parameters are consistent with Grade I diastolic dysfunction (impaired  relaxation).   3. Right ventricular systolic function is normal. The right ventricular   size is normal. There is normal pulmonary artery systolic pressure.   4. The mitral valve is normal in structure. Trivial mitral valve  regurgitation. No evidence of mitral stenosis.   5. The aortic valve is calcified. Aortic valve regurgitation is mild. No  aortic stenosis is present.   6. Aortic dilatation noted. There is borderline dilatation of the aortic  root, measuring 39 mm.   7. The inferior vena cava is normal in size with greater than 50%  respiratory variability, suggesting right atrial pressure of 3 mmHg.   Comparison(s): Prior images unable to be directly viewed, comparison made  by report only.   FINDINGS   Left Ventricle: Left ventricular ejection fraction, by estimation, is 55  to 60%. The left ventricle has normal function. The left ventricle  demonstrates regional wall motion abnormalities. The left ventricular  internal cavity size was normal in size.  There is no left ventricular hypertrophy. Left ventricular diastolic  parameters are consistent with Grade I diastolic dysfunction (impaired  relaxation).   Right Ventricle: The right ventricular size is normal. Right ventricular  systolic function is normal. There is normal pulmonary artery systolic  pressure. The tricuspid regurgitant velocity is 1.89 m/s, and with an  assumed right atrial pressure of 15  mmHg, the estimated right ventricular systolic pressure is 51.7 mmHg.   Left Atrium: Left atrial size was normal in size.   Right Atrium: Right atrial size was normal in size.   Pericardium: There is no evidence of pericardial effusion.   Mitral Valve: The mitral valve is normal in structure. Mild mitral annular  calcification. Trivial mitral valve regurgitation. No evidence of mitral  valve stenosis.   Tricuspid Valve: The tricuspid valve is normal in structure. Tricuspid  valve regurgitation is mild . No evidence of tricuspid stenosis.   Aortic Valve: The aortic valve is calcified. Aortic valve  regurgitation is  mild. Aortic regurgitation PHT measures 697 msec. No aortic stenosis is  present. Aortic valve mean gradient measures 9.0 mmHg. Aortic valve peak  gradient measures 17.7 mmHg.  Aortic valve area, by VTI measures 2.20 cm.   Pulmonic Valve: The pulmonic valve was normal in structure.  Pulmonic valve  regurgitation is not visualized. No evidence of pulmonic stenosis.   Aorta: Aortic dilatation noted. There is borderline dilatation of the  aortic root, measuring 39 mm.   Venous: The inferior vena cava is normal in size with greater than 50%  respiratory variability, suggesting right atrial pressure of 3 mmHg.   IAS/Shunts: The interatrial septum is aneurysmal. No atrial level shunt  detected by color flow Doppler.   Additional Comments: Akinesis of the basal inferior wall with overall  preserved LV function; calcified aortic valve with very mild AS (mean  gradient 9 mmHg).   Patient Profile     88 y.o. male with a hx of HTN, HLD, no hx of CAD  who is being seen 07/27/2021 for the evaluation of elevated troponins at the request of Dr. Myna Hidalgo. Found to have multivessel CAD, now with plans for CABG.   Assessment & Plan    NSTEMI: hsTn peaked at 5799. Underwent cardiac cath noted above with 3v CAD. CABG with Dr Cyndia Bent on Monday  HTN:  BP in normal range    HLD: LDL 94 -- on Crestor 20mg  daily   Anemia: Hct 29 FOB negative stable post cath will likely need transfusion post CABG   Thrombocytopenia: platelets 121>>128 -- follow while on IV heparin  Seizure Activity: initially presented with shaking episode. MRI did not show acute CVA. Etiology unclear. No further episodes.   Aortic Stenosis :  mild no plans for AVR given mean gradient 9 mmHg , age and TAVR option in future    For questions or updates, please contact [facility] Please consult [email or web address] for contact info under      Signed, Jenkins Rouge, MD  07/30/2021, 9:40 AM     07/30/2021 9:40 AM

## 2021-07-30 NOTE — Progress Notes (Signed)
ANTICOAGULATION CONSULT NOTE  Pharmacy Consult for heparin Indication: chest pain/ACS  Heparin Dosing Weight: 68 kg  Labs: Recent Labs    07/27/21 0530 07/27/21 0928 07/28/21 0301 07/28/21 0301 07/28/21 1219 07/29/21 0254 07/30/21 0251  HGB  --   --  9.3*   < >  --  9.4* 9.5*  HCT  --   --  27.5*  --   --  27.8* 29.0*  PLT  --   --  121*  --   --  130* 128*  HEPARINUNFRC  --   --  0.28*   < > 0.38 0.33 0.25*  CREATININE  --   --  1.30*  --   --  1.22  --   TROPONINIHS 993* 5,799*  --   --   --   --   --    < > = values in this interval not displayed.     Estimated Creatinine Clearance: 37.1 mL/min (by C-G formula based on SCr of 1.22 mg/dL).  Assessment: 57 yom presenting with CP, elevated and increasing high-sensitivity troponin. Pharmacy consulted to dose heparin. Patient is not on anticoagulation PTA.   Underwent cardiac cath, finding diffuse 3 vessel CAD, plan for CABG next week. Restarted IV heparin post cath. Heparin level his AM is subtherapeutic on heparin infusion at 900 units/hr. No s/s of bleeding noted per RN.  Goal of Therapy:  Heparin level 0.3-0.7 units/ml Monitor platelets by anticoagulation protocol: Yes   Plan:  Increase heparin infusion to 950 units/hr to keep within range F/u 8 hr HL  Monitor daily CBC, s/sx bleeding  Albertina Parr, PharmD., BCPS, BCCCP Clinical Pharmacist Please refer to [facility] for unit-specific pharmacist

## 2021-07-30 NOTE — Progress Notes (Addendum)
[facility] PROGRESS NOTE   Karen Kinnard Puzio MIW:[id number] DOB: [birth year] DOA: 07/27/2021  3 DOS: the patient was seen and examined on 07/30/2021  PCP: Hoyt Koch, MD  Brief History and Hospital Course:  88 y.o. male with medical history significant of hypertension comes in after being found unresponsive and shaking by his wife.  Patient came in via EMS and was called in as a code STEMI.  Patient was noted to be hypotensive with systolics in the 25O and 03B and bradycardic.  He became combative in the ambulance was given Versed and IV fluids.  Subsequently seen by cardiology.  Code STEMI was canceled.  Repeat troponin was noted to be significantly elevated.  Underwent cardiac catheterization which revealed triple-vessel disease.  Plan is for CABG on Monday.   Consultants: Cardiology.  Cardiothoracic surgery  Procedures:  Cardiac catheterization.   Transthoracic echocardiogram.    Subjective: Patient feels well.  Denies any chest pain shortness of breath.  No complaints offered.    Assessment/Plan:  * NSTEMI (non-ST elevated myocardial infarction) ([place])- (present on admission) Initially there was concern for STEMI.  Cardiology was consulted.  Not thought to be a STEMI.  However patient did have significant rise in troponin.  Subsequently thought to be a non-STEMI.   Underwent cardiac catheterization which showed triple-vessel disease. Cardiothoracic surgery was consulted.  Plan is for CABG on Monday.   Patient is on heparin infusion.  Patient is on aspirin and statin.   Defer further cardiac medications to cardiology who is also following.  Has not been placed on beta-blockers or ACE inhibitor due to low blood pressures. LDL noted to be 94.   Echocardiogram shows normal systolic function without regional wall motion abnormalities. Cardiac status is stable.  Essential hypertension- (present on admission) Blood pressure has been borderline low.  Holding his  antihypertensives.   Was on HCTZ and losartan prior to admission.    CKD (chronic kidney disease) stage 3, GFR 30-59 ml/min (HCC)- (present on admission) Renal function close to baseline.  Avoid nephrotoxic agents.  Monitor urine output.  B12 deficiency- (present on admission) Started on B12 supplementation as discussed under anemia  Macrocytic anemia- (present on admission) Hemoglobin noted to be low.  Stool for occult blood was negative. Anemia panel does not show any clear-cut deficiency but B12 level is noted to be on the low side.  Started on B12 supplementation.   No other reason for anemia is determined at this time.  TSH is normal. Recommend rechecking CBC in a few weeks.  May benefit from referral to hematology at some point in the future.  Thrombocytopenia ([place]) Continue to monitor while on heparin.  Counts are stable.  Seizure-like activity [facility]) Patient apparently had some kind of shaking episode on the morning of admission. No focal neurological deficits noted.  MRI did not show any acute stroke.  Symptomatology not consistent with seizure activity.   Could have had perhaps a syncopal or near syncopal episode since he mentioned that it happened once he got out of bed.  Could have had a orthostatic drop in his blood pressure. No further recurrence.  Continue to monitor.  Aortic stenosis No plans for aortic valve replacement at this time.       DVT Prophylaxis: On IV heparin Code Status: Full code Family Communication: Discussed with the patient Disposition Plan: To be determined  Status is: Inpatient  Remains inpatient appropriate because: NSTEMI.  Need for bypass surgery  Medications: Scheduled:  aspirin EC  81 mg Oral Daily   [START ON 08/01/2021] epinephrine  0-10 mcg/min Intravenous To OR   [START ON 08/01/2021] heparin-papaverine-plasmalyte irrigation   Irrigation To OR   [START ON 08/01/2021] insulin   Intravenous To OR   [START ON 08/01/2021]  magnesium sulfate  40 mEq Other To OR   [START ON 08/01/2021] phenylephrine  30-200 mcg/min Intravenous To OR   [START ON 08/01/2021] potassium chloride  80 mEq Other To OR   rosuvastatin  20 mg Oral Daily   sodium chloride flush  3 mL Intravenous Q12H   [START ON 08/01/2021] tranexamic acid  15 mg/kg Intravenous To OR   [START ON 08/01/2021] tranexamic acid  2 mg/kg Intracatheter To OR   vitamin B-12  1,000 mcg Oral Daily   Continuous:  sodium chloride 20 mL/hr at 07/27/21 1304   sodium chloride     [START ON 08/01/2021]  ceFAZolin (ANCEF) IV     [START ON 08/01/2021]  ceFAZolin (ANCEF) IV     [START ON 08/01/2021] dexmedetomidine     [START ON 08/01/2021] heparin 30,000 units/NS 1000 mL solution for CELLSAVER     heparin 950 Units/hr (07/30/21 0935)   [START ON 08/01/2021] milrinone     [START ON 08/01/2021] nitroGLYCERIN     [START ON 08/01/2021] norepinephrine     [START ON 08/01/2021] tranexamic acid (CYKLOKAPRON) infusion ([facility])     [START ON 08/01/2021] vancomycin     TFT:DDUKGU chloride, acetaminophen, nitroGLYCERIN, ondansetron (ZOFRAN) IV, sodium chloride flush  Antibiotics: Anti-infectives (From admission, onward)    Start     Dose/Rate Route Frequency Ordered Stop   08/01/21 0600  vancomycin (VANCOREADY) IVPB 1250 mg/250 mL        1,250 mg 166.7 mL/hr over 90 Minutes Intravenous To Surgery 07/29/21 1152 08/02/21 0600   08/01/21 0600  ceFAZolin (ANCEF) IVPB 2g/100 mL premix        2 g 200 mL/hr over 30 Minutes Intravenous To Surgery 07/29/21 1152 08/02/21 0600   08/01/21 0600  ceFAZolin (ANCEF) IVPB 2g/100 mL premix        2 g 200 mL/hr over 30 Minutes Intravenous To Surgery 07/29/21 1152 08/02/21 0600       Objective:  Vital Signs  Vitals:   07/29/21 0451 07/29/21 1330 07/29/21 2040 07/30/21 0611  BP: (!) 151/64 (!) 103/57 116/69 125/70  Pulse: 73 69 74 72  Resp:  17 18 18   Temp: 98.5 F (36.9 C) 98.1 F (36.7 C) 97.9 F (36.6 C) 98.4 F (36.9 C)  TempSrc: Oral  Oral Oral Oral  SpO2: 95% 97% 95% 97%  Weight: 69 kg   69.7 kg  Height:        Intake/Output Summary (Last 24 hours) at 07/30/2021 1021 Last data filed at 07/30/2021 0900 Gross per 24 hour  Intake 849.72 ml  Output 325 ml  Net 524.72 ml   Filed Weights   07/28/21 0433 07/29/21 0451 07/30/21 0611  Weight: 69.2 kg 69 kg 69.7 kg    General appearance: Awake alert.  In no distress Resp: Clear to auscultation bilaterally.  Normal effort Cardio: S1-S2 is normal regular.  No S3-S4.   GI: Abdomen is soft.  Nontender nondistended.  Bowel sounds are present normal.  No masses organomegaly Extremities: No edema.  Full range of motion of lower extremities. Neurologic: Alert and oriented x3.  No focal neurological deficits.     Lab Results:  Data Reviewed: I have personally reviewed labs and imaging  study reports  CBC: Recent Labs  Lab 07/27/21 0025 07/27/21 0028 07/28/21 0301 07/29/21 0254 07/30/21 0251  WBC 5.4  --  7.1 9.9 9.0  NEUTROABS 3.0  --   --   --   --   HGB 9.8* 9.9* 9.3* 9.4* 9.5*  HCT 28.9* 29.0* 27.5* 27.8* 29.0*  MCV 100.7*  --  101.1* 100.0 100.0  PLT 144*  --  121* 130* 128*    Basic Metabolic Panel: Recent Labs  Lab 07/27/21 0025 07/27/21 0028 07/27/21 1004 07/28/21 0301 07/29/21 0254  NA 135 138  --  136 137  K 3.8 3.8  --  4.0 4.9  CL 107 105  --  108 106  CO2 19*  --   --  21* 24  GLUCOSE 117* 114*  --  115* 115*  BUN 28* 26*  --  28* 33*  CREATININE 1.29* 1.20  --  1.30* 1.22  CALCIUM 8.0*  --   --  8.5* 8.9  MG  --   --  1.6*  --  1.9    GFR: Estimated Creatinine Clearance: 37.1 mL/min (by C-G formula based on SCr of 1.22 mg/dL).  Liver Function Tests: Recent Labs  Lab 07/27/21 0025  AST 22  ALT 17  ALKPHOS 50  BILITOT 0.3  PROT 5.6*  ALBUMIN 3.2*     Coagulation Profile: Recent Labs  Lab 07/27/21 0025  INR 1.0     HbA1C: No results for input(s): HGBA1C in the last 72 hours.     Lipid Profile: Recent Labs     07/28/21 0301  CHOL 162  HDL 57  LDLCALC 94  TRIG 54  CHOLHDL 2.8    Thyroid Function Tests: Recent Labs    07/27/21 1824  TSH 1.029    Anemia Panel: Recent Labs    07/27/21 1824  VITAMINB12 201  FOLATE 16.9  FERRITIN 135  TIBC 287  IRON 109  RETICCTPCT 1.3    Recent Results (from the past 240 hour(s))  Resp Panel by RT-PCR (Flu A&B, Covid) Nasopharyngeal Swab     Status: None   Collection Time: 07/27/21 12:25 AM   Specimen: Nasopharyngeal Swab; Nasopharyngeal(NP) swabs in vial transport medium  Result Value Ref Range Status   SARS Coronavirus 2 by RT PCR NEGATIVE NEGATIVE Final    Comment: (NOTE) SARS-CoV-2 target nucleic acids are NOT DETECTED.  The SARS-CoV-2 RNA is generally detectable in upper respiratory specimens during the acute phase of infection. The lowest concentration of SARS-CoV-2 viral copies this assay can detect is 138 copies/mL. A negative result does not preclude SARS-Cov-2 infection and should not be used as the sole basis for treatment or other patient management decisions. A negative result may occur with  improper specimen collection/handling, submission of specimen other than nasopharyngeal swab, presence of viral mutation(s) within the areas targeted by this assay, and inadequate number of viral copies(<138 copies/mL). A negative result must be combined with clinical observations, patient history, and epidemiological information. The expected result is Negative.  Fact Sheet for Patients:  EntrepreneurPulse.com.au  Fact Sheet for Healthcare Providers:  IncredibleEmployment.be  This test is no t yet approved or cleared by the [place] FDA and  has been authorized for detection and/or diagnosis of SARS-CoV-2 by FDA under an Emergency Use Authorization (EUA). This EUA will remain  in effect (meaning this test can be used) for the duration of the COVID-19 declaration under Section 564(b)(1) of the  Act, 21 U.S.C.section 360bbb-3(b)(1), unless the authorization is terminated  or revoked sooner.       Influenza A by PCR NEGATIVE NEGATIVE Final   Influenza B by PCR NEGATIVE NEGATIVE Final    Comment: (NOTE) The Xpert Xpress SARS-CoV-2/FLU/RSV plus assay is intended as an aid in the diagnosis of influenza from Nasopharyngeal swab specimens and should not be used as a sole basis for treatment. Nasal washings and aspirates are unacceptable for Xpert Xpress SARS-CoV-2/FLU/RSV testing.  Fact Sheet for Patients: EntrepreneurPulse.com.au  Fact Sheet for Healthcare Providers: IncredibleEmployment.be  This test is not yet approved or cleared by the [place] FDA and has been authorized for detection and/or diagnosis of SARS-CoV-2 by FDA under an Emergency Use Authorization (EUA). This EUA will remain in effect (meaning this test can be used) for the duration of the COVID-19 declaration under Section 564(b)(1) of the Act, 21 U.S.C. section 360bbb-3(b)(1), unless the authorization is terminated or revoked.  Performed at [facility], [place] [address] [place], [place] [address]       Radiology Studies: ECHOCARDIOGRAM COMPLETE  Result Date: 07/28/2021    ECHOCARDIOGRAM REPORT   Patient Name:   IFEANYICHUKWU WICKHAM Date of Exam: 07/28/2021 Medical Rec #:  [id number]      Height:       64.0 in Accession #:    [id number]     Weight:       152.5 lb Date of Birth:  11/27/1933     BSA:          1.743 m Patient Age:    69 years       BP:           103/58 mmHg Patient Gender: M              HR:           66 bpm. Exam Location:  Inpatient Procedure: 2D Echo Indications:    Nstemi  History:        Patient has prior history of Echocardiogram examinations, most                 recent 09/24/2018. Risk Factors:Hypertension.  Sonographer:    Jefferey Pica Referring Phys: [id number] A ARIDA  Sonographer Comments: Image acquisition challenging due to respiratory  motion. IMPRESSIONS  1. Akinesis of the basal inferior wall with overall preserved LV function; calcified aortic valve with very mild AS (mean gradient 9 mmHg).  2. Left ventricular ejection fraction, by estimation, is 55 to 60%. The left ventricle has normal function. The left ventricle demonstrates regional wall motion abnormalities (see scoring diagram/findings for description). Left ventricular diastolic parameters are consistent with Grade I diastolic dysfunction (impaired relaxation).  3. Right ventricular systolic function is normal. The right ventricular size is normal. There is normal pulmonary artery systolic pressure.  4. The mitral valve is normal in structure. Trivial mitral valve regurgitation. No evidence of mitral stenosis.  5. The aortic valve is calcified. Aortic valve regurgitation is mild. No aortic stenosis is present.  6. Aortic dilatation noted. There is borderline dilatation of the aortic root, measuring 39 mm.  7. The inferior vena cava is normal in size with greater than 50% respiratory variability, suggesting right atrial pressure of 3 mmHg. Comparison(s): Prior images unable to be directly viewed, comparison made by report only. FINDINGS  Left Ventricle: Left ventricular ejection fraction, by estimation, is 55 to 60%. The left ventricle has normal function. The left ventricle demonstrates regional wall motion abnormalities. The left ventricular internal cavity size was normal in  size. There is no left ventricular hypertrophy. Left ventricular diastolic parameters are consistent with Grade I diastolic dysfunction (impaired relaxation). Right Ventricle: The right ventricular size is normal. Right ventricular systolic function is normal. There is normal pulmonary artery systolic pressure. The tricuspid regurgitant velocity is 1.89 m/s, and with an assumed right atrial pressure of 15 mmHg, the estimated right ventricular systolic pressure is 62.2 mmHg. Left Atrium: Left atrial size was normal  in size. Right Atrium: Right atrial size was normal in size. Pericardium: There is no evidence of pericardial effusion. Mitral Valve: The mitral valve is normal in structure. Mild mitral annular calcification. Trivial mitral valve regurgitation. No evidence of mitral valve stenosis. Tricuspid Valve: The tricuspid valve is normal in structure. Tricuspid valve regurgitation is mild . No evidence of tricuspid stenosis. Aortic Valve: The aortic valve is calcified. Aortic valve regurgitation is mild. Aortic regurgitation PHT measures 697 msec. No aortic stenosis is present. Aortic valve mean gradient measures 9.0 mmHg. Aortic valve peak gradient measures 17.7 mmHg. Aortic valve area, by VTI measures 2.20 cm. Pulmonic Valve: The pulmonic valve was normal in structure. Pulmonic valve regurgitation is not visualized. No evidence of pulmonic stenosis. Aorta: Aortic dilatation noted. There is borderline dilatation of the aortic root, measuring 39 mm. Venous: The inferior vena cava is normal in size with greater than 50% respiratory variability, suggesting right atrial pressure of 3 mmHg. IAS/Shunts: The interatrial septum is aneurysmal. No atrial level shunt detected by color flow Doppler. Additional Comments: Akinesis of the basal inferior wall with overall preserved LV function; calcified aortic valve with very mild AS (mean gradient 9 mmHg).  LEFT VENTRICLE PLAX 2D LVIDd:         4.50 cm   Diastology LVIDs:         2.80 cm   LV e' medial:    5.74 cm/s LV PW:         0.90 cm   LV E/e' medial:  8.9 LV IVS:        1.00 cm   LV e' lateral:   8.59 cm/s LVOT diam:     2.00 cm   LV E/e' lateral: 5.9 LV SV:         102 LV SV Index:   59 LVOT Area:     3.14 cm  RIGHT VENTRICLE             IVC RV Basal diam:  2.50 cm     IVC diam: 2.20 cm RV S prime:     10.30 cm/s TAPSE (M-mode): 1.3 cm LEFT ATRIUM             Index        RIGHT ATRIUM           Index LA diam:        3.40 cm 1.95 cm/m   RA Area:     12.90 cm LA Vol (A2C):    48.8 ml 27.99 ml/m  RA Volume:   30.60 ml  17.55 ml/m LA Vol (A4C):   37.1 ml 21.28 ml/m LA Biplane Vol: 43.0 ml 24.66 ml/m  AORTIC VALVE                     PULMONIC VALVE AV Area (Vmax):    2.13 cm      PV Vmax:       0.57 m/s AV Area (Vmean):   2.14 cm      PV Peak grad:  1.3 mmHg AV  Area (VTI):     2.20 cm AV Vmax:           210.50 cm/s AV Vmean:          140.500 cm/s AV VTI:            0.465 m AV Peak Grad:      17.7 mmHg AV Mean Grad:      9.0 mmHg LVOT Vmax:         143.00 cm/s LVOT Vmean:        95.600 cm/s LVOT VTI:          0.325 m LVOT/AV VTI ratio: 0.70 AI PHT:            697 msec  AORTA Ao Root diam: 3.90 cm Ao Asc diam:  3.40 cm MITRAL VALVE                TRICUSPID VALVE MV Area (PHT): 4.04 cm     TR Peak grad:   14.3 mmHg MV Decel Time: 188 msec     TR Vmax:        189.00 cm/s MV E velocity: 51.00 cm/s MV A velocity: 102.00 cm/s  SHUNTS MV E/A ratio:  0.50         Systemic VTI:  0.32 m                             Systemic Diam: 2.00 cm Kirk Ruths MD Electronically signed by Kirk Ruths MD Signature Date/Time: 07/28/2021/1:07:43 PM    Final    VAS US DOPPLER PRE CABG  Result Date: 07/28/2021 PREOPERATIVE VASCULAR EVALUATION Patient Name:  Danny [name]  Date of Exam:   07/28/2021 Medical Rec #: [id number]       Accession #:    [id number] Date of Birth: 08-17-1933      Patient Gender: M Patient Age:   9 years Exam Location:  [facility] Procedure:      VAS US DOPPLER PRE CABG Referring Phys: Gilford Raid --------------------------------------------------------------------------------  Indications:      Pre-CABG. Risk Factors:     Hypertension, hyperlipidemia, past history of smoking, prior                   MI. Limitations:      Involuntary patient movement of both hands and feet Comparison Study: 10-25-2017 Lower extremity arterial studies showed mild                   arterial disease bilaterally. ABI 0.92/0.46 RT and 0.94/0.48                   LT. Performing Technologist:  Darlin Coco RDMS RVT  Examination Guidelines: A complete evaluation includes B-mode imaging, spectral Doppler, color Doppler, and power Doppler as needed of all accessible portions of each vessel. Bilateral testing is considered an integral part of a complete examination. Limited examinations for reoccurring indications may be performed as noted.  Right Carotid Findings: +----------+--------+--------+--------+---------------------+------------------+             PSV cm/s EDV cm/s Stenosis Describe              Comments            +----------+--------+--------+--------+---------------------+------------------+  CCA Prox   60       14                                                          +----------+--------+--------+--------+---------------------+------------------+  CCA Distal 61       18                                      intimal thickening  +----------+--------+--------+--------+---------------------+------------------+  ICA Prox   130      47       40-59%   heterogenous,                                                                    hypoechoic and smooth                     +----------+--------+--------+--------+---------------------+------------------+  ICA Mid    129      50                                      tortuous            +----------+--------+--------+--------+---------------------+------------------+  ICA Distal 63       21                                      tortuous            +----------+--------+--------+--------+---------------------+------------------+  ECA        90       10                                                          +----------+--------+--------+--------+---------------------+------------------+ +----------+--------+-------+----------------+------------+             PSV cm/s EDV cms Describe         Arm Pressure  +----------+--------+-------+----------------+------------+  Subclavian 71               Multiphasic, WNL                +----------+--------+-------+----------------+------------+ +---------+--------+--+--------+--+---------+  Vertebral PSV cm/s 59 EDV cm/s 16 Antegrade  +---------+--------+--+--------+--+---------+ Left Carotid Findings: +----------+--------+--------+--------+--------+--------+             PSV cm/s EDV cm/s Stenosis Describe Comments  +----------+--------+--------+--------+--------+--------+  CCA Prox   73       21                                   +----------+--------+--------+--------+--------+--------+  CCA Distal 71       25                                   +----------+--------+--------+--------+--------+--------+  ICA Prox   94       33       1-39%    calcific           +----------+--------+--------+--------+--------+--------+  ICA Distal 76  26                                   +----------+--------+--------+--------+--------+--------+  ECA        65       11                                   +----------+--------+--------+--------+--------+--------+ +----------+--------+--------+----------------+------------+  Subclavian PSV cm/s EDV cm/s Describe         Arm Pressure  +----------+--------+--------+----------------+------------+             104               Multiphasic, WNL               +----------+--------+--------+----------------+------------+ +---------+--------+--+--------+--+---------+  Vertebral PSV cm/s 53 EDV cm/s 22 Antegrade  +---------+--------+--+--------+--+---------+  ABI Findings: +---------+------------------+-----+----------+--------+  Right     Rt Pressure (mmHg) Index Waveform   Comment   +---------+------------------+-----+----------+--------+  Brachial  124                      triphasic            +---------+------------------+-----+----------+--------+  PTA       66                 0.53  monophasic           +---------+------------------+-----+----------+--------+  DP        81                 0.65  biphasic             +---------+------------------+-----+----------+--------+   Great Toe 50                 0.40  Abnormal             +---------+------------------+-----+----------+--------+ +---------+------------------+-----+---------+-------+  Left      Lt Pressure (mmHg) Index Waveform  Comment  +---------+------------------+-----+---------+-------+  Brachial  124                      triphasic          +---------+------------------+-----+---------+-------+  PTA       92                 0.74  biphasic           +---------+------------------+-----+---------+-------+  DP        102                0.82  biphasic           +---------+------------------+-----+---------+-------+  Great Toe 80                 0.65  Abnormal           +---------+------------------+-----+---------+-------+ +-------+---------------+----------------+  ABI/TBI Today's ABI/TBI Previous ABI/TBI  +-------+---------------+----------------+  Right   0.65/0.40       0.92/0.46         +-------+---------------+----------------+  Left    0.82/0.65       0.94/0.48         +-------+---------------+----------------+  Right Doppler Findings: +--------+--------+-----+---------+--------+  Site     Pressure Index Doppler   Comments  +--------+--------+-----+---------+--------+  Brachial 124            triphasic           +--------+--------+-----+---------+--------+  Radial                  triphasic           +--------+--------+-----+---------+--------+  Ulnar                   triphasic           +--------+--------+-----+---------+--------+  Left Doppler Findings: +--------+--------+-----+---------+--------+  Site     Pressure Index Doppler   Comments  +--------+--------+-----+---------+--------+  Brachial 124            triphasic           +--------+--------+-----+---------+--------+  Radial                  triphasic           +--------+--------+-----+---------+--------+  Ulnar                   triphasic           +--------+--------+-----+---------+--------+  Summary: Right Carotid: Velocities in the right ICA are consistent  with a 40-59%                stenosis. Left Carotid: Velocities in the left ICA are consistent with a 1-39% stenosis. Vertebrals:  Bilateral vertebral arteries demonstrate antegrade flow. Subclavians: Normal flow hemodynamics were seen in bilateral subclavian              arteries. Right ABI: Resting right ankle-brachial index indicates moderate right lower extremity arterial disease. The right toe-brachial index is abnormal. Right ABIs appear decreased as compared to previous examination on 10-25-2017. Left ABI: Resting left ankle-brachial index indicates mild left lower extremity arterial disease. The left toe-brachial index is abnormal. Left ABIs appear essentially unchanged as compared to previous examination on 10-25-2017. Right Upper Extremity: Doppler waveform obliterate with right radial compression. Doppler waveforms remain within normal limits with right ulnar compression. Left Upper Extremity: Doppler waveform obliterate with left radial compression. Doppler waveforms remain within normal limits with left ulnar compression.  Electronically signed by Orlie Pollen on 07/28/2021 at 6:59:27 PM.    Final        LOS: 3 days   [facility] Pager on [email or web address]  07/30/2021, 10:21 AM

## 2021-07-30 NOTE — Progress Notes (Signed)
CARDIAC REHAB PHASE I   PRE:  Rate/Rhythm: 75 SR  BP:  Sitting: 113/60      SaO2: 96 RA  MODE:  Ambulation: 200 ft   POST:  Rate/Rhythm: 99 SR  BP:  Sitting: 114/100      SaO2: 95 RA  Pt tolerated exercise well and amb 200 ft steadying himself with IV pole, and contact guard steadying assist. Pt denies CP, SOB, or dizziness throughout walk. Discussed sternal precautions and IS use with pt. Will continue to follow.  [place], MS, ACSM-CEP 07/30/2021 9:16 AM

## 2021-07-30 NOTE — Assessment & Plan Note (Signed)
No plans for aortic valve replacement at this time.

## 2021-07-30 NOTE — Progress Notes (Signed)
Mobility Specialist: Progress Note   07/30/21 1635  Mobility  Activity Ambulated with assistance in hallway  Level of Assistance Contact guard assist, steadying assist  Assistive Device  (IV Pole)  Distance Ambulated (ft) 300 ft  Activity Response Tolerated well  $Mobility charge 1 Mobility   Pre-Mobility: 80 HR Post-Mobility: 94 HR  Pt stiff/unsteady upon standing but progressed better with distance. Pt back to bed after walk with call bell and phone at his side. Bed alarm on.   [facility] Deniese Oberry Mobility Specialist Mobility Specialist [address] [place]: [phone] Mobility Specialist 2 [place]: [phone]

## 2021-07-30 NOTE — Progress Notes (Addendum)
ANTICOAGULATION CONSULT NOTE - Follow Up Consult  Pharmacy Consult for heparin Indication:  CAD awaiting CABG  Labs: Recent Labs    07/28/21 0301 07/28/21 1219 07/29/21 0254 07/30/21 0251 07/30/21 1131 07/30/21 2147  HGB 9.3*  --  9.4* 9.5*  --   --   HCT 27.5*  --  27.8* 29.0*  --   --   PLT 121*  --  130* 128*  --   --   HEPARINUNFRC 0.28*   < > 0.33 0.25* 0.19* 0.20*  CREATININE 1.30*  --  1.22  --   --   --    < > = values in this interval not displayed.    Assessment: 88yo male subtherapeutic on heparin with no change in level despite rate change; no infusion issues or signs of bleeding per RN.  Goal of Therapy:  Heparin level 0.3-0.7 units/ml   Plan:  Will increase heparin infusion by 2-3 units/kg/hr to 1150 units/hr and check level in 8 hours.    Wynona Neat, PharmD, BCPS  07/30/2021,11:34 PM

## 2021-07-30 NOTE — Plan of Care (Signed)
  Problem: Cardiovascular: Goal: Ability to achieve and maintain adequate cardiovascular perfusion will improve Outcome: Progressing Goal: Vascular access site(s) Level 0-1 will be maintained Outcome: Progressing   Problem: Clinical Measurements: Goal: Ability to maintain clinical measurements within normal limits will improve Outcome: Progressing Goal: Will remain free from infection Outcome: Progressing Goal: Diagnostic test results will improve Outcome: Progressing Goal: Respiratory complications will improve Outcome: Progressing Goal: Cardiovascular complication will be avoided Outcome: Progressing   

## 2021-07-30 NOTE — Progress Notes (Addendum)
ANTICOAGULATION CONSULT NOTE - Follow Up Consult  Pharmacy Consult for Heparin Indication: chest pain/ACS  Allergies  Allergen Reactions   Lipitor [Atorvastatin]     Muscle soreness    Patient Measurements: Height: 5\' 4"  (162.6 cm) Weight: 69.7 kg (153 lb 10.6 oz) IBW/kg (Calculated) : 59.2 Heparin Dosing Weight: 68 kg  Vital Signs: Temp: 98.4 F (36.9 C) (01/21 0611) Temp Source: Oral (01/21 0611) BP: 125/70 (01/21 0611) Pulse Rate: 72 (01/21 0611)  Labs: Recent Labs    07/28/21 0301 07/28/21 1219 07/29/21 0254 07/30/21 0251 07/30/21 1131  HGB 9.3*  --  9.4* 9.5*  --   HCT 27.5*  --  27.8* 29.0*  --   PLT 121*  --  130* 128*  --   HEPARINUNFRC 0.28*   < > 0.33 0.25* 0.19*  CREATININE 1.30*  --  1.22  --   --    < > = values in this interval not displayed.    Estimated Creatinine Clearance: 37.1 mL/min (by C-G formula based on SCr of 1.22 mg/dL).   Medications:  Scheduled:   aspirin EC  81 mg Oral Daily   [START ON 08/01/2021] epinephrine  0-10 mcg/min Intravenous To OR   [START ON 08/01/2021] heparin-papaverine-plasmalyte irrigation   Irrigation To OR   [START ON 08/01/2021] insulin   Intravenous To OR   [START ON 08/01/2021] magnesium sulfate  40 mEq Other To OR   [START ON 08/01/2021] phenylephrine  30-200 mcg/min Intravenous To OR   [START ON 08/01/2021] potassium chloride  80 mEq Other To OR   rosuvastatin  20 mg Oral Daily   sodium chloride flush  3 mL Intravenous Q12H   [START ON 08/01/2021] tranexamic acid  15 mg/kg Intravenous To OR   [START ON 08/01/2021] tranexamic acid  2 mg/kg Intracatheter To OR   vitamin B-12  1,000 mcg Oral Daily   Infusions:   sodium chloride 20 mL/hr at 07/27/21 1304   sodium chloride     [START ON 08/01/2021]  ceFAZolin (ANCEF) IV     [START ON 08/01/2021]  ceFAZolin (ANCEF) IV     [START ON 08/01/2021] dexmedetomidine     [START ON 08/01/2021] heparin 30,000 units/NS 1000 mL solution for CELLSAVER     heparin 950 Units/hr  (07/30/21 0935)   [START ON 08/01/2021] milrinone     [START ON 08/01/2021] nitroGLYCERIN     [START ON 08/01/2021] norepinephrine     [START ON 08/01/2021] tranexamic acid (CYKLOKAPRON) infusion ([facility])     [START ON 08/01/2021] vancomycin      Assessment: 88 yo M presenting with CP, elevated and increasing high-sensitivity troponin. Patient was not on anticoagulation PTA. Patient underwent cardiac cath on 1/18, finding diffuse 3-vessel CAD, plans noted for CABG next week. Pharmacy consulted for heparin dosing. Heparin restarted 4 hours post-cath.   Heparin level today is subtherapeutic at 0.19, on 950 units/hr. Hgb 9.5, plt 128. Per RN, patient's heparin line has beeped a few times when the patient is bending his arm, but has been infusing. No signs/symptoms of bleeding noted.  Given patient was previously therapeutic at a lower gtt rate and RN reported issues with line - no bolus, will increase gtt rate slightly and check another level in ~8 hrs.   Goal of Therapy:  Heparin level 0.3-0.7 units/ml Monitor platelets by anticoagulation protocol: Yes   Plan:  Increase heparin gtt to 1000 units/hr. Check ~8hr heparin level.  Daily CBC, heparin level. Monitor for signs/symptoms of bleeding.   Estill Bamberg  Maylene Roes, PharmD PGY1 Pharmacy Resident Phone [phone] 07/30/2021 12:47 PM   Please check AMION for all [place] phone numbers After 10:00 PM, call [place] [phone]

## 2021-07-30 NOTE — Assessment & Plan Note (Signed)
Started on B12 supplementation as discussed under anemia

## 2021-07-31 DIAGNOSIS — I350 Nonrheumatic aortic (valve) stenosis: Secondary | ICD-10-CM | POA: Diagnosis not present

## 2021-07-31 LAB — CBC
HCT: 25.1 % — ABNORMAL LOW (ref 39.0–52.0)
Hemoglobin: 8.5 g/dL — ABNORMAL LOW (ref 13.0–17.0)
MCH: 33.6 pg (ref 26.0–34.0)
MCHC: 33.9 g/dL (ref 30.0–36.0)
MCV: 99.2 fL (ref 80.0–100.0)
Platelets: 131 10*3/uL — ABNORMAL LOW (ref 150–400)
RBC: 2.53 MIL/uL — ABNORMAL LOW (ref 4.22–5.81)
RDW: 13.6 % (ref 11.5–15.5)
WBC: 8.3 10*3/uL (ref 4.0–10.5)
nRBC: 0 % (ref 0.0–0.2)

## 2021-07-31 LAB — MAGNESIUM: Magnesium: 1.7 mg/dL (ref 1.7–2.4)

## 2021-07-31 LAB — BASIC METABOLIC PANEL
Anion gap: 6 (ref 5–15)
BUN: 36 mg/dL — ABNORMAL HIGH (ref 8–23)
CO2: 20 mmol/L — ABNORMAL LOW (ref 22–32)
Calcium: 8.1 mg/dL — ABNORMAL LOW (ref 8.9–10.3)
Chloride: 106 mmol/L (ref 98–111)
Creatinine, Ser: 1.24 mg/dL (ref 0.61–1.24)
GFR, Estimated: 57 mL/min — ABNORMAL LOW (ref >60)
Glucose, Bld: 95 mg/dL (ref 70–99)
Potassium: 4 mmol/L (ref 3.5–5.1)
Sodium: 132 mmol/L — ABNORMAL LOW (ref 135–145)

## 2021-07-31 LAB — SURGICAL PCR SCREEN
MRSA, PCR: NEGATIVE
Staphylococcus aureus: NEGATIVE

## 2021-07-31 LAB — ABO/RH: ABO/RH(D): A POS

## 2021-07-31 LAB — HEPARIN LEVEL (UNFRACTIONATED): Heparin Unfractionated: 0.35 IU/mL (ref 0.30–0.70)

## 2021-07-31 MED ORDER — BISACODYL 5 MG PO TBEC: 5.0000 mg | DELAYED_RELEASE_TABLET | Freq: Once | ORAL | Status: DC

## 2021-07-31 MED ORDER — DIAZEPAM 2 MG PO TABS
2.0000 mg | ORAL_TABLET | Freq: Once | ORAL | Status: AC
  Administered 2021-08-01: 2 mg via ORAL
  Filled 2021-07-31: qty 1

## 2021-07-31 MED ORDER — BACITRACIN-NEOMYCIN-POLYMYXIN 400-5-5000 EX OINT
TOPICAL_OINTMENT | Freq: Every day | CUTANEOUS | Status: DC
  Administered 2021-08-01: 2 via TOPICAL
  Filled 2021-07-31: qty 1

## 2021-07-31 MED ORDER — CHLORHEXIDINE GLUCONATE CLOTH 2 % EX PADS
6.0000 | MEDICATED_PAD | Freq: Once | CUTANEOUS | Status: AC
  Administered 2021-08-01: 6 via TOPICAL

## 2021-07-31 MED ORDER — TEMAZEPAM 7.5 MG PO CAPS: 15.0000 mg | ORAL_CAPSULE | Freq: Once | ORAL | Status: DC | PRN

## 2021-07-31 MED ORDER — CHLORHEXIDINE GLUCONATE 0.12 % MT SOLN
15.0000 mL | Freq: Once | OROMUCOSAL | Status: AC
  Administered 2021-08-01: 15 mL via OROMUCOSAL
  Filled 2021-07-31: qty 15

## 2021-07-31 MED ORDER — CHLORHEXIDINE GLUCONATE CLOTH 2 % EX PADS
6.0000 | MEDICATED_PAD | Freq: Every day | CUTANEOUS | Status: DC
  Administered 2021-07-31: 6 via TOPICAL

## 2021-07-31 MED ORDER — BACITRACIN-NEOMYCIN-POLYMYXIN 400-5-5000 EX OINT: TOPICAL_OINTMENT | Freq: Every day | CUTANEOUS | Status: DC

## 2021-07-31 MED ORDER — MAGNESIUM SULFATE 2 GM/50ML IV SOLN
2.0000 g | Freq: Once | INTRAVENOUS | Status: AC
  Administered 2021-07-31: 2 g via INTRAVENOUS
  Filled 2021-07-31: qty 50

## 2021-07-31 MED ORDER — METOPROLOL TARTRATE 12.5 MG HALF TABLET
12.5000 mg | ORAL_TABLET | Freq: Once | ORAL | Status: AC
  Administered 2021-08-01: 12.5 mg via ORAL
  Filled 2021-07-31: qty 1

## 2021-07-31 MED ORDER — CHLORHEXIDINE GLUCONATE CLOTH 2 % EX PADS
6.0000 | MEDICATED_PAD | Freq: Once | CUTANEOUS | Status: AC
Start: 2021-07-31 — End: 2021-07-31
  Administered 2021-07-31: 6 via TOPICAL

## 2021-07-31 NOTE — Progress Notes (Addendum)
While on floor telemetry noted 4 second pause Review shows some sinus exit block brief Patient asymptomatic Now in NSR rates 70's as usual He is not on beta blocker or AV nodal drug  Not having chest pain Repeat BMET pending Last K was 4.9 and need to r/o higher level  While talking to nurse he had another 7 second pause Again asymptomatic and now NSR 70's  He has had some belching and given some OJ/prune juice  Nothing else He is not having chest pain   Place external pacer  Transfer to unit Stat ECG and BMET  Have notified Dr Roxan Hockey that patient Is more unstable today waiting on CABG  Jenkins Rouge MD [facility]

## 2021-07-31 NOTE — Progress Notes (Signed)
[facility] PROGRESS NOTE   Danny [name] XBM:[id number] DOB: January 02, 1934 DOA: 07/27/2021  4 DOS: the patient was seen and examined on 07/31/2021  PCP: Hoyt Koch, MD  Brief History and Hospital Course:  88 y.o. male with medical history significant of hypertension comes in after being found unresponsive and shaking by his wife.  Patient came in via EMS and was called in as a code STEMI.  Patient was noted to be hypotensive with systolics in the 02V and 25D and bradycardic.  He became combative in the ambulance was given Versed and IV fluids.  Subsequently seen by cardiology.  Code STEMI was canceled.  Repeat troponin was noted to be significantly elevated.  Underwent cardiac catheterization which revealed triple-vessel disease.  Plan is for CABG on Monday.   Consultants: Cardiology.  Cardiothoracic surgery  Procedures:  Cardiac catheterization.   Transthoracic echocardiogram.    Subjective: Patient feels well.  Has not had a bowel movement in a few days.  Denies any chest pain shortness of breath.  No bleeding episodes.      Assessment/Plan:  * NSTEMI (non-ST elevated myocardial infarction) ([place])- (present on admission) Initially there was concern for STEMI.  Cardiology was consulted.  Not thought to be a STEMI.  However patient did have significant rise in troponin.  Subsequently thought to be a non-STEMI.   Underwent cardiac catheterization which showed triple-vessel disease. Cardiothoracic surgery was consulted.  Plan is for CABG on Monday.   Patient is on heparin infusion.  Patient is on aspirin and statin.   Defer further cardiac medications to cardiology who is also following.  Has not been placed on beta-blockers or ACE inhibitor due to low blood pressures. LDL noted to be 94.   Echocardiogram shows normal systolic function without regional wall motion abnormalities. Cardiac status is stable.  Denies any chest pain  Essential hypertension- (present on  admission) Blood pressure has been borderline low.  Holding his antihypertensives.   Was on HCTZ and losartan prior to admission.    CKD (chronic kidney disease) stage 3, GFR 30-59 ml/min (HCC)- (present on admission) Renal function close to baseline.  Avoid nephrotoxic agents.  Monitor urine output.  B12 deficiency- (present on admission) Started on B12 supplementation as discussed under anemia  Macrocytic anemia- (present on admission) Hemoglobin noted to be low.  Stool for occult blood was negative. Anemia panel does not show any clear-cut deficiency but B12 level is noted to be on the low side.  Started on B12 supplementation.   No other reason for anemia is determined at this time.  TSH is normal. Recommend following CBC in the outpatient setting.  May benefit from referral to hematology at some point in the future. Hemoglobin noted to be slightly lower today compared to yesterday.  No overt bleeding has been noted.  Likely he will need blood transfusion perioperatively.  Will defer this to the surgeons.  Monitor for signs of bleeding.  Thrombocytopenia ([place]) Continue to monitor while on heparin.  Counts are stable.  Seizure-like activity [facility]) Patient apparently had some kind of shaking episode on the morning of admission. No focal neurological deficits noted.  MRI did not show any acute stroke.  Symptomatology not consistent with seizure activity.   Could have had perhaps a syncopal or near syncopal episode since he mentioned that it happened once he got out of bed.  Could have had a orthostatic drop in his blood pressure. No further recurrence.  Continue to monitor.  Aortic stenosis  No plans for aortic valve replacement at this time.       DVT Prophylaxis: On IV heparin Code Status: Full code Family Communication: Discussed with the patient Disposition Plan: To be determined  Status is: Inpatient  Remains inpatient appropriate because: NSTEMI.  Need for bypass  surgery         Medications: Scheduled:  aspirin EC  81 mg Oral Daily   [START ON 08/01/2021] epinephrine  0-10 mcg/min Intravenous To OR   [START ON 08/01/2021] heparin-papaverine-plasmalyte irrigation   Irrigation To OR   [START ON 08/01/2021] insulin   Intravenous To OR   [START ON 08/01/2021] magnesium sulfate  40 mEq Other To OR   [START ON 08/01/2021] phenylephrine  30-200 mcg/min Intravenous To OR   [START ON 08/01/2021] potassium chloride  80 mEq Other To OR   rosuvastatin  20 mg Oral Daily   sodium chloride flush  3 mL Intravenous Q12H   [START ON 08/01/2021] tranexamic acid  15 mg/kg Intravenous To OR   [START ON 08/01/2021] tranexamic acid  2 mg/kg Intracatheter To OR   vitamin B-12  1,000 mcg Oral Daily   Continuous:  sodium chloride 20 mL/hr at 07/27/21 1304   sodium chloride     [START ON 08/01/2021]  ceFAZolin (ANCEF) IV     [START ON 08/01/2021]  ceFAZolin (ANCEF) IV     [START ON 08/01/2021] dexmedetomidine     [START ON 08/01/2021] heparin 30,000 units/NS 1000 mL solution for CELLSAVER     heparin 1,150 Units/hr (07/31/21 0747)   [START ON 08/01/2021] milrinone     [START ON 08/01/2021] nitroGLYCERIN     [START ON 08/01/2021] norepinephrine     [START ON 08/01/2021] tranexamic acid (CYKLOKAPRON) infusion ([facility])     [START ON 08/01/2021] vancomycin     ZOX:WRUEAV chloride, acetaminophen, docusate sodium, nitroGLYCERIN, ondansetron (ZOFRAN) IV, sodium chloride flush  Antibiotics: Anti-infectives (From admission, onward)    Start     Dose/Rate Route Frequency Ordered Stop   08/01/21 0600  vancomycin (VANCOREADY) IVPB 1250 mg/250 mL        1,250 mg 166.7 mL/hr over 90 Minutes Intravenous To Surgery 07/29/21 1152 08/02/21 0600   08/01/21 0600  ceFAZolin (ANCEF) IVPB 2g/100 mL premix        2 g 200 mL/hr over 30 Minutes Intravenous To Surgery 07/29/21 1152 08/02/21 0600   08/01/21 0600  ceFAZolin (ANCEF) IVPB 2g/100 mL premix        2 g 200 mL/hr over 30 Minutes  Intravenous To Surgery 07/29/21 1152 08/02/21 0600       Objective:  Vital Signs  Vitals:   07/30/21 0611 07/30/21 1309 07/30/21 2111 07/31/21 0500  BP: 125/70 (!) 111/59 (!) 110/59 101/87  Pulse: 72 72 78 68  Resp: 18 17 16 17   Temp: 98.4 F (36.9 C) 98.4 F (36.9 C) 98.3 F (36.8 C) 99.1 F (37.3 C)  TempSrc: Oral Oral Oral Oral  SpO2: 97%  95% 100%  Weight: 69.7 kg   70.8 kg  Height:        Intake/Output Summary (Last 24 hours) at 07/31/2021 1007 Last data filed at 07/30/2021 2113 Gross per 24 hour  Intake 243 ml  Output 800 ml  Net -557 ml    Filed Weights   07/29/21 0451 07/30/21 0611 07/31/21 0500  Weight: 69 kg 69.7 kg 70.8 kg    General appearance: Awake alert.  In no distress Resp: Clear to auscultation bilaterally.  Normal effort Cardio: S1-S2 is  normal regular.  No S3-S4.  No rubs murmurs or bruit GI: Abdomen is soft.  Nontender nondistended.  Bowel sounds are present normal.  No masses organomegaly Extremities: No edema.  Full range of motion of lower extremities. Neurologic: Alert and oriented x3.  No focal neurological deficits.      Lab Results:  Data Reviewed: I have personally reviewed labs and imaging study reports  CBC: Recent Labs  Lab 07/27/21 0025 07/27/21 0028 07/28/21 0301 07/29/21 0254 07/30/21 0251 07/31/21 0259  WBC 5.4  --  7.1 9.9 9.0 8.3  NEUTROABS 3.0  --   --   --   --   --   HGB 9.8* 9.9* 9.3* 9.4* 9.5* 8.5*  HCT 28.9* 29.0* 27.5* 27.8* 29.0* 25.1*  MCV 100.7*  --  101.1* 100.0 100.0 99.2  PLT 144*  --  121* 130* 128* 131*     Basic Metabolic Panel: Recent Labs  Lab 07/27/21 0025 07/27/21 0028 07/27/21 1004 07/28/21 0301 07/29/21 0254  NA 135 138  --  136 137  K 3.8 3.8  --  4.0 4.9  CL 107 105  --  108 106  CO2 19*  --   --  21* 24  GLUCOSE 117* 114*  --  115* 115*  BUN 28* 26*  --  28* 33*  CREATININE 1.29* 1.20  --  1.30* 1.22  CALCIUM 8.0*  --   --  8.5* 8.9  MG  --   --  1.6*  --  1.9      GFR: Estimated Creatinine Clearance: 37.1 mL/min (by C-G formula based on SCr of 1.22 mg/dL).  Liver Function Tests: Recent Labs  Lab 07/27/21 0025  AST 22  ALT 17  ALKPHOS 50  BILITOT 0.3  PROT 5.6*  ALBUMIN 3.2*      Coagulation Profile: Recent Labs  Lab 07/27/21 0025  INR 1.0      Recent Results (from the past 240 hour(s))  Resp Panel by RT-PCR (Flu A&B, Covid) Nasopharyngeal Swab     Status: None   Collection Time: 07/27/21 12:25 AM   Specimen: Nasopharyngeal Swab; Nasopharyngeal(NP) swabs in vial transport medium  Result Value Ref Range Status   SARS Coronavirus 2 by RT PCR NEGATIVE NEGATIVE Final    Comment: (NOTE) SARS-CoV-2 target nucleic acids are NOT DETECTED.  The SARS-CoV-2 RNA is generally detectable in upper respiratory specimens during the acute phase of infection. The lowest concentration of SARS-CoV-2 viral copies this assay can detect is 138 copies/mL. A negative result does not preclude SARS-Cov-2 infection and should not be used as the sole basis for treatment or other patient management decisions. A negative result may occur with  improper specimen collection/handling, submission of specimen other than nasopharyngeal swab, presence of viral mutation(s) within the areas targeted by this assay, and inadequate number of viral copies(<138 copies/mL). A negative result must be combined with clinical observations, patient history, and epidemiological information. The expected result is Negative.  Fact Sheet for Patients:  EntrepreneurPulse.com.au  Fact Sheet for Healthcare Providers:  IncredibleEmployment.be  This test is no t yet approved or cleared by the [place] FDA and  has been authorized for detection and/or diagnosis of SARS-CoV-2 by FDA under an Emergency Use Authorization (EUA). This EUA will remain  in effect (meaning this test can be used) for the duration of the COVID-19 declaration  under Section 564(b)(1) of the Act, 21 U.S.C.section 360bbb-3(b)(1), unless the authorization is terminated  or revoked sooner.  Influenza A by PCR NEGATIVE NEGATIVE Final   Influenza B by PCR NEGATIVE NEGATIVE Final    Comment: (NOTE) The Xpert Xpress SARS-CoV-2/FLU/RSV plus assay is intended as an aid in the diagnosis of influenza from Nasopharyngeal swab specimens and should not be used as a sole basis for treatment. Nasal washings and aspirates are unacceptable for Xpert Xpress SARS-CoV-2/FLU/RSV testing.  Fact Sheet for Patients: EntrepreneurPulse.com.au  Fact Sheet for Healthcare Providers: IncredibleEmployment.be  This test is not yet approved or cleared by the [place] FDA and has been authorized for detection and/or diagnosis of SARS-CoV-2 by FDA under an Emergency Use Authorization (EUA). This EUA will remain in effect (meaning this test can be used) for the duration of the COVID-19 declaration under Section 564(b)(1) of the Act, 21 U.S.C. section 360bbb-3(b)(1), unless the authorization is terminated or revoked.  Performed at [facility], [place] [address] [place], [place] [address]        Radiology Studies: No results found.     LOS: 4 days   Zakary Kimura [employer] on [email or web address]  07/31/2021, 10:07 AM

## 2021-07-31 NOTE — Progress Notes (Signed)
Progress Note  Patient Name: Danny [name] Date of Encounter: 07/31/2021  [facility] Cardiologist: Mertie Moores, MD   Subjective   No angina ready for surgery in am   Inpatient Medications    Scheduled Meds:  aspirin EC  81 mg Oral Daily   [START ON 08/01/2021] epinephrine  0-10 mcg/min Intravenous To OR   [START ON 08/01/2021] heparin-papaverine-plasmalyte irrigation   Irrigation To OR   [START ON 08/01/2021] insulin   Intravenous To OR   [START ON 08/01/2021] magnesium sulfate  40 mEq Other To OR   [START ON 08/01/2021] phenylephrine  30-200 mcg/min Intravenous To OR   [START ON 08/01/2021] potassium chloride  80 mEq Other To OR   rosuvastatin  20 mg Oral Daily   sodium chloride flush  3 mL Intravenous Q12H   [START ON 08/01/2021] tranexamic acid  15 mg/kg Intravenous To OR   [START ON 08/01/2021] tranexamic acid  2 mg/kg Intracatheter To OR   vitamin B-12  1,000 mcg Oral Daily   Continuous Infusions:  sodium chloride 20 mL/hr at 07/27/21 1304   sodium chloride     [START ON 08/01/2021]  ceFAZolin (ANCEF) IV     [START ON 08/01/2021]  ceFAZolin (ANCEF) IV     [START ON 08/01/2021] dexmedetomidine     [START ON 08/01/2021] heparin 30,000 units/NS 1000 mL solution for CELLSAVER     heparin 1,150 Units/hr (07/31/21 0747)   [START ON 08/01/2021] milrinone     [START ON 08/01/2021] nitroGLYCERIN     [START ON 08/01/2021] norepinephrine     [START ON 08/01/2021] tranexamic acid (CYKLOKAPRON) infusion ([facility])     [START ON 08/01/2021] vancomycin     PRN Meds: sodium chloride, acetaminophen, docusate sodium, nitroGLYCERIN, ondansetron (ZOFRAN) IV, sodium chloride flush   Vital Signs    Vitals:   07/30/21 0611 07/30/21 1309 07/30/21 2111 07/31/21 0500  BP: 125/70 (!) 111/59 (!) 110/59 101/87  Pulse: 72 72 78 68  Resp: 18 17 16 17   Temp: 98.4 F (36.9 C) 98.4 F (36.9 C) 98.3 F (36.8 C) 99.1 F (37.3 C)  TempSrc: Oral Oral Oral Oral  SpO2: 97%  95% 100%  Weight: 69.7 kg    70.8 kg  Height:        Intake/Output Summary (Last 24 hours) at 07/31/2021 0912 Last data filed at 07/30/2021 2113 Gross per 24 hour  Intake 243 ml  Output 800 ml  Net -557 ml   Last 3 Weights 07/31/2021 07/30/2021 07/29/2021  Weight (lbs) 156 lb 1.4 oz 153 lb 10.6 oz 152 lb 3.2 oz  Weight (kg) 70.8 kg 69.7 kg 69.037 kg      Telemetry    SR - Personally Reviewed  ECG    No new tracing  Physical Exam   Affect appropriate Healthy:  appears stated age HEENT: normal Neck supple with no adenopathy JVP normal no bruits no thyromegaly Lungs clear with no wheezing and good diaphragmatic motion Heart:  S1/S2 mild AS  murmur, no rub, gallop or click PMI normal Abdomen: benighn, BS positve, no tenderness, no AAA no bruit.  No HSM or HJR Distal pulses intact with no bruits No edema Neuro non-focal Skin warm and dry No muscular weakness   Labs    High Sensitivity Troponin:   Recent Labs  Lab 07/27/21 0025 07/27/21 0225 07/27/21 0530 07/27/21 0928  TROPONINIHS 9 61* 993* 5,799*     Chemistry Recent Labs  Lab 07/27/21 0025 07/27/21 0028 07/27/21 1004 07/28/21 0301  07/29/21 0254  NA 135 138  --  136 137  K 3.8 3.8  --  4.0 4.9  CL 107 105  --  108 106  CO2 19*  --   --  21* 24  GLUCOSE 117* 114*  --  115* 115*  BUN 28* 26*  --  28* 33*  CREATININE 1.29* 1.20  --  1.30* 1.22  CALCIUM 8.0*  --   --  8.5* 8.9  MG  --   --  1.6*  --  1.9  PROT 5.6*  --   --   --   --   ALBUMIN 3.2*  --   --   --   --   AST 22  --   --   --   --   ALT 17  --   --   --   --   ALKPHOS 50  --   --   --   --   BILITOT 0.3  --   --   --   --   GFRNONAA 54*  --   --  54* 58*  ANIONGAP 9  --   --  7 7    Lipids  Recent Labs  Lab 07/28/21 0301  CHOL 162  TRIG 54  HDL 57  LDLCALC 94  CHOLHDL 2.8    Hematology Recent Labs  Lab 07/29/21 0254 07/30/21 0251 07/31/21 0259  WBC 9.9 9.0 8.3  RBC 2.78* 2.90* 2.53*  HGB 9.4* 9.5* 8.5*  HCT 27.8* 29.0* 25.1*  MCV 100.0 100.0  99.2  MCH 33.8 32.8 33.6  MCHC 33.8 32.8 33.9  RDW 13.9 13.8 13.6  PLT 130* 128* 131*   Thyroid  Recent Labs  Lab 07/27/21 1824  TSH 1.029    BNP Recent Labs  Lab 07/27/21 0026  BNP 51.3    DDimer No results for input(s): DDIMER in the last 168 hours.   Radiology    No results found.  Cardiac Studies   Cardiac catheterization (07/27/2021)     Prox RCA lesion is 100% stenosed.   1st Mrg lesion is 90% stenosed.   Mid Cx to Dist Cx lesion is 90% stenosed.   1st Diag lesion is 95% stenosed.   2nd Diag-1 lesion is 80% stenosed.   2nd Diag-2 lesion is 85% stenosed.   Mid LAD lesion is 85% stenosed.   There is mild left ventricular systolic dysfunction.   LV end diastolic pressure is normal.   1.  Moderately calcified coronary arteries with severe three-vessel coronary artery disease.  The culprit for myocardial infarction is likely occluded proximal right coronary artery.  The onset of occlusion occlusion is likely more than 12 hours with no chest pain. 2.  Mildly reduced LV systolic function with an EF of 40 to 45% with normal left ventricular end-diastolic pressure.  There is evidence of basal to mid inferior wall hypokinesis.   Recommendations: Given diffuse three-vessel coronary artery disease, I think the best option for revascularization is CABG. Resume heparin in 4 hours at 7 PM.   Coronary Diagrams   Diagnostic Dominance: Right   Echo: 07/28/2021  IMPRESSIONS     1. Akinesis of the basal inferior wall with overall preserved LV  function; calcified aortic valve with very mild AS (mean gradient 9 mmHg).   2. Left ventricular ejection fraction, by estimation, is 55 to 60%. The  left ventricle has normal function. The left ventricle demonstrates  regional wall motion abnormalities (see scoring diagram/findings for  description). Left ventricular diastolic  parameters are consistent with Grade I diastolic dysfunction (impaired  relaxation).   3. Right  ventricular systolic function is normal. The right ventricular  size is normal. There is normal pulmonary artery systolic pressure.   4. The mitral valve is normal in structure. Trivial mitral valve  regurgitation. No evidence of mitral stenosis.   5. The aortic valve is calcified. Aortic valve regurgitation is mild. No  aortic stenosis is present.   6. Aortic dilatation noted. There is borderline dilatation of the aortic  root, measuring 39 mm.   7. The inferior vena cava is normal in size with greater than 50%  respiratory variability, suggesting right atrial pressure of 3 mmHg.   Comparison(s): Prior images unable to be directly viewed, comparison made  by report only.   FINDINGS   Left Ventricle: Left ventricular ejection fraction, by estimation, is 55  to 60%. The left ventricle has normal function. The left ventricle  demonstrates regional wall motion abnormalities. The left ventricular  internal cavity size was normal in size.  There is no left ventricular hypertrophy. Left ventricular diastolic  parameters are consistent with Grade I diastolic dysfunction (impaired  relaxation).   Right Ventricle: The right ventricular size is normal. Right ventricular  systolic function is normal. There is normal pulmonary artery systolic  pressure. The tricuspid regurgitant velocity is 1.89 m/s, and with an  assumed right atrial pressure of 15  mmHg, the estimated right ventricular systolic pressure is 76.2 mmHg.   Left Atrium: Left atrial size was normal in size.   Right Atrium: Right atrial size was normal in size.   Pericardium: There is no evidence of pericardial effusion.   Mitral Valve: The mitral valve is normal in structure. Mild mitral annular  calcification. Trivial mitral valve regurgitation. No evidence of mitral  valve stenosis.   Tricuspid Valve: The tricuspid valve is normal in structure. Tricuspid  valve regurgitation is mild . No evidence of tricuspid stenosis.    Aortic Valve: The aortic valve is calcified. Aortic valve regurgitation is  mild. Aortic regurgitation PHT measures 697 msec. No aortic stenosis is  present. Aortic valve mean gradient measures 9.0 mmHg. Aortic valve peak  gradient measures 17.7 mmHg.  Aortic valve area, by VTI measures 2.20 cm.   Pulmonic Valve: The pulmonic valve was normal in structure. Pulmonic valve  regurgitation is not visualized. No evidence of pulmonic stenosis.   Aorta: Aortic dilatation noted. There is borderline dilatation of the  aortic root, measuring 39 mm.   Venous: The inferior vena cava is normal in size with greater than 50%  respiratory variability, suggesting right atrial pressure of 3 mmHg.   IAS/Shunts: The interatrial septum is aneurysmal. No atrial level shunt  detected by color flow Doppler.   Additional Comments: Akinesis of the basal inferior wall with overall  preserved LV function; calcified aortic valve with very mild AS (mean  gradient 9 mmHg).   Patient Profile     88 y.o. male with a hx of HTN, HLD, no hx of CAD  who is being seen 07/27/2021 for the evaluation of elevated troponins at the request of Dr. Myna Hidalgo. Found to have multivessel CAD, now with plans for CABG.   Assessment & Plan    NSTEMI: hsTn peaked at 5799. Underwent cardiac cath noted above with 3v CAD. CABG with Dr Cyndia Bent on Monday  HTN:  BP in normal range    HLD: LDL 94 -- on Crestor 20mg  daily   Anemia:  Hct 29 FOB negative stable post cath will likely need transfusion post CABG   Thrombocytopenia: platelets 121>>128 -- follow while on IV heparin  Seizure Activity: initially presented with shaking episode. MRI did not show acute CVA. Etiology unclear. No further episodes.   Aortic Stenosis :  mild no plans for AVR given mean gradient 9 mmHg , age and TAVR option in future    For questions or updates, please contact [facility] Please consult [email or web address] for contact info under      Signed, Jenkins Rouge, MD  07/31/2021, 9:12 AM     07/31/2021 9:12 AM

## 2021-07-31 NOTE — Anesthesia Preprocedure Evaluation (Addendum)
Anesthesia Evaluation  Patient identified by MRN, date of birth, ID band Patient awake    Reviewed: Allergy & Precautions, NPO status , Patient's Chart, lab work & pertinent test results  Airway Mallampati: II  TM Distance: >3 FB     Dental   Pulmonary neg pulmonary ROS, former smoker,    breath sounds clear to auscultation       Cardiovascular hypertension, + Past MI   Rhythm:Regular Rate:Normal     Neuro/Psych    GI/Hepatic Neg liver ROS, GERD  ,  Endo/Other    Renal/GU Renal disease     Musculoskeletal   Abdominal   Peds  Hematology   Anesthesia Other Findings   Reproductive/Obstetrics                            Anesthesia Physical Anesthesia Plan  ASA: 3  Anesthesia Plan: General   Post-op Pain Management:    Induction: Intravenous  PONV Risk Score and Plan: 3 and Treatment may vary due to age or medical condition, Ondansetron and Midazolam  Airway Management Planned:   Additional Equipment: Arterial line, PA Cath, Ultrasound Guidance Line Placement and TEE  Intra-op Plan:   Post-operative Plan: Post-operative intubation/ventilation  Informed Consent: I have reviewed the patients History and Physical, chart, labs and discussed the procedure including the risks, benefits and alternatives for the proposed anesthesia with the patient or authorized representative who has indicated his/her understanding and acceptance.     Dental advisory given  Plan Discussed with: CRNA, Anesthesiologist and Surgeon  Anesthesia Plan Comments:        Anesthesia Quick Evaluation

## 2021-07-31 NOTE — Progress Notes (Signed)
° °   °  [address]       [place] [address]             [phone]      No complaints  Visiting with pastor  BP 126/90    Pulse 82    Temp 98.6 F (37 C) (Oral)    Resp 19    Ht 5\' 4"  (1.626 m)    Wt 70.8 kg    SpO2 91%    BMI 26.79 kg/m   Intake/Output Summary (Last 24 hours) at 07/31/2021 1726 Last data filed at 07/31/2021 1512 Gross per 24 hour  Intake 483 ml  Output 600 ml  Net -117 ml   No further episodes of brady/ pauses  Remo Lipps C. Roxan Hockey, MD [facility] [phone]

## 2021-07-31 NOTE — Progress Notes (Signed)
Patient had 3.95 second pause per CMT (Peggy).  Patients BP= 125/53 HR 71.  Patient denies chest pain or dizziness.  Will notify Dr Johnsie Cancel.

## 2021-07-31 NOTE — Plan of Care (Signed)
  Problem: Education: Goal: Understanding of CV disease, CV risk reduction, and recovery process will improve Outcome: Progressing Goal: Individualized Educational Video(s) Outcome: Progressing   Problem: Activity: Goal: Ability to return to baseline activity level will improve Outcome: Progressing   Problem: Cardiovascular: Goal: Ability to achieve and maintain adequate cardiovascular perfusion will improve Outcome: Progressing Goal: Vascular access site(s) Level 0-1 will be maintained Outcome: Progressing   Problem: Health Behavior/Discharge Planning: Goal: Ability to safely manage health-related needs after discharge will improve Outcome: Progressing   Problem: Education: Goal: Knowledge of General Education information will improve Description: Including pain rating scale, medication(s)/side effects and non-pharmacologic comfort measures Outcome: Progressing   Problem: Health Behavior/Discharge Planning: Goal: Ability to manage health-related needs will improve Outcome: Progressing   Problem: Clinical Measurements: Goal: Ability to maintain clinical measurements within normal limits will improve Outcome: Progressing Goal: Will remain free from infection Outcome: Progressing Goal: Diagnostic test results will improve Outcome: Progressing Goal: Respiratory complications will improve Outcome: Progressing Goal: Cardiovascular complication will be avoided Outcome: Progressing   Problem: Activity: Goal: Risk for activity intolerance will decrease Outcome: Progressing   Problem: Nutrition: Goal: Adequate nutrition will be maintained Outcome: Progressing   Problem: Coping: Goal: Level of anxiety will decrease Outcome: Progressing   Problem: Elimination: Goal: Will not experience complications related to bowel motility Outcome: Progressing Goal: Will not experience complications related to urinary retention Outcome: Progressing   Problem: Pain Managment: Goal:  General experience of comfort will improve Outcome: Progressing   Problem: Safety: Goal: Ability to remain free from injury will improve Outcome: Progressing   Problem: Skin Integrity: Goal: Risk for impaired skin integrity will decrease Outcome: Progressing   Problem: Education: Goal: Will demonstrate proper wound care and an understanding of methods to prevent future damage Outcome: Progressing Goal: Knowledge of disease or condition will improve Outcome: Progressing Goal: Knowledge of the prescribed therapeutic regimen will improve Outcome: Progressing Goal: Individualized Educational Video(s) Outcome: Progressing   Problem: Activity: Goal: Risk for activity intolerance will decrease Outcome: Progressing   Problem: Cardiac: Goal: Will achieve and/or maintain hemodynamic stability Outcome: Progressing   Problem: Clinical Measurements: Goal: Postoperative complications will be avoided or minimized Outcome: Progressing   Problem: Respiratory: Goal: Respiratory status will improve Outcome: Progressing   Problem: Skin Integrity: Goal: Wound healing without signs and symptoms of infection Outcome: Progressing Goal: Risk for impaired skin integrity will decrease Outcome: Progressing   Problem: Urinary Elimination: Goal: Ability to achieve and maintain adequate renal perfusion and functioning will improve Outcome: Progressing   

## 2021-07-31 NOTE — Progress Notes (Signed)
4 Days Post-Op Procedure(s) (LRB): LEFT HEART CATH AND CORONARY ANGIOGRAPHY (N/A) Subjective: Events of this morning noted Currently denies CP, SOB or any ill feeling  Objective: Vital signs in last 24 hours: Temp:  [98.3 F (36.8 C)-99.1 F (37.3 C)] 99.1 F (37.3 C) (01/22 0500) Pulse Rate:  [68-78] 68 (01/22 0500) Cardiac Rhythm: Other (Comment) (01/22 1027) Resp:  [16-17] 17 (01/22 0500) BP: (101-111)/(59-87) 101/87 (01/22 0500) SpO2:  [95 %-100 %] 100 % (01/22 0500) Weight:  [70.8 kg] 70.8 kg (01/22 0500)  Hemodynamic parameters for last 24 hours:    Intake/Output from previous day: 01/21 0701 - 01/22 0700 In: 423 [P.O.:420; I.V.:3] Out: 800 [Urine:800] Intake/Output this shift: Total I/O In: 240 [P.O.:240] Out: -   General appearance: alert, cooperative, and no distress Neurologic: intact Heart: regular rate and rhythm  Lab Results: Recent Labs    07/30/21 0251 07/31/21 0259  WBC 9.0 8.3  HGB 9.5* 8.5*  HCT 29.0* 25.1*  PLT 128* 131*   BMET:  Recent Labs    07/29/21 0254  NA 137  K 4.9  CL 106  CO2 24  GLUCOSE 115*  BUN 33*  CREATININE 1.22  CALCIUM 8.9    PT/INR: No results for input(s): LABPROT, INR in the last 72 hours. ABG    Component Value Date/Time   TCO2 20 (L) 07/27/2021 0028   CBG (last 3)  Recent Labs    07/28/21 1608  GLUCAP 162*    Assessment/Plan: S/P Procedure(s) (LRB): LEFT HEART CATH AND CORONARY ANGIOGRAPHY (N/A) 3 vessel CAD. On schedule for CABG tomorrow He had some issues with sinus pauses earlier. Also some belching. Could be vagal, but given his presentation caution is warranted. He currently looks and feels well so I doubt even silent angina at present Will follow closely If he becomes unstable will proceed with urgent CABG today   LOS: 4 days    Melrose Nakayama 07/31/2021

## 2021-07-31 NOTE — Progress Notes (Signed)
ANTICOAGULATION CONSULT NOTE - Follow Up Consult  Pharmacy Consult for Heparin Indication: chest pain/ACS  Allergies  Allergen Reactions   Lipitor [Atorvastatin]     Muscle soreness    Patient Measurements: Height: 5\' 4"  (162.6 cm) Weight: 70.8 kg (156 lb 1.4 oz) IBW/kg (Calculated) : 59.2 Heparin Dosing Weight: 68 kg  Vital Signs: Temp: 99.1 F (37.3 C) (01/22 0500) Temp Source: Oral (01/22 0500) BP: 101/87 (01/22 0500) Pulse Rate: 68 (01/22 0500)  Labs: Recent Labs    07/29/21 0254 07/30/21 0251 07/30/21 1131 07/30/21 2147 07/31/21 0259 07/31/21 0813  HGB 9.4* 9.5*  --   --  8.5*  --   HCT 27.8* 29.0*  --   --  25.1*  --   PLT 130* 128*  --   --  131*  --   HEPARINUNFRC 0.33 0.25* 0.19* 0.20*  --  0.35  CREATININE 1.22  --   --   --   --   --     Estimated Creatinine Clearance: 37.1 mL/min (by C-G formula based on SCr of 1.22 mg/dL).   Medications:  Scheduled:   aspirin EC  81 mg Oral Daily   [START ON 08/01/2021] epinephrine  0-10 mcg/min Intravenous To OR   [START ON 08/01/2021] heparin-papaverine-plasmalyte irrigation   Irrigation To OR   [START ON 08/01/2021] insulin   Intravenous To OR   [START ON 08/01/2021] magnesium sulfate  40 mEq Other To OR   [START ON 08/01/2021] phenylephrine  30-200 mcg/min Intravenous To OR   [START ON 08/01/2021] potassium chloride  80 mEq Other To OR   rosuvastatin  20 mg Oral Daily   sodium chloride flush  3 mL Intravenous Q12H   [START ON 08/01/2021] tranexamic acid  15 mg/kg Intravenous To OR   [START ON 08/01/2021] tranexamic acid  2 mg/kg Intracatheter To OR   vitamin B-12  1,000 mcg Oral Daily   Infusions:   sodium chloride 20 mL/hr at 07/27/21 1304   sodium chloride     [START ON 08/01/2021]  ceFAZolin (ANCEF) IV     [START ON 08/01/2021]  ceFAZolin (ANCEF) IV     [START ON 08/01/2021] dexmedetomidine     [START ON 08/01/2021] heparin 30,000 units/NS 1000 mL solution for CELLSAVER     heparin 1,150 Units/hr (07/31/21  0747)   [START ON 08/01/2021] milrinone     [START ON 08/01/2021] nitroGLYCERIN     [START ON 08/01/2021] norepinephrine     [START ON 08/01/2021] tranexamic acid (CYKLOKAPRON) infusion ([facility])     [START ON 08/01/2021] vancomycin      Assessment: 88 yo M presenting with CP, elevated and increasing high-sensitivity troponin. Patient was not on anticoagulation PTA. Patient underwent cardiac cath on 1/18, finding diffuse 3-vessel CAD, plans noted for CABG next week. Pharmacy consulted for heparin dosing. Heparin restarted 4 hours post-cath.   Heparin level today is therapeutic at 0.35, on 1150 units/hr. Hgb 8.5, plt 131. No line issues or signs/symptoms of bleeding noted per RN.  Goal of Therapy:  Heparin level 0.3-0.7 units/ml Monitor platelets by anticoagulation protocol: Yes   Plan:  Continue heparin gtt at 1150 units/hr. Check ~8 hr heparin level.  Daily CBC, heparin level. Monitor for signs/symptoms of bleeding. Plans noted for CABG on 1/23.    Vance Peper, PharmD PGY1 Pharmacy Resident Phone [phone] 07/31/2021 9:20 AM   Please check AMION for all [place] phone numbers After 10:00 PM, call [place] [phone]

## 2021-08-01 ENCOUNTER — Other Ambulatory Visit: Payer: Self-pay

## 2021-08-01 ENCOUNTER — Inpatient Hospital Stay (HOSPITAL_COMMUNITY): Payer: Medicare PPO | Admitting: Certified Registered"

## 2021-08-01 ENCOUNTER — Inpatient Hospital Stay (HOSPITAL_COMMUNITY): Payer: Medicare PPO

## 2021-08-01 ENCOUNTER — Inpatient Hospital Stay (HOSPITAL_COMMUNITY)
Admission: EM | Disposition: A | Discharge status: Home Health | Payer: Self-pay | Source: Home / Self Care | Attending: Surgery

## 2021-08-01 DIAGNOSIS — I214 Non-ST elevation (NSTEMI) myocardial infarction: Secondary | ICD-10-CM | POA: Diagnosis not present

## 2021-08-01 DIAGNOSIS — Z951 Presence of aortocoronary bypass graft: Secondary | ICD-10-CM

## 2021-08-01 DIAGNOSIS — I2510 Atherosclerotic heart disease of native coronary artery without angina pectoris: Secondary | ICD-10-CM | POA: Diagnosis not present

## 2021-08-01 HISTORY — PX: CORONARY ARTERY BYPASS GRAFT: SHX141

## 2021-08-01 HISTORY — PX: ENDOVEIN HARVEST OF GREATER SAPHENOUS VEIN: SHX5059

## 2021-08-01 HISTORY — PX: TEE WITHOUT CARDIOVERSION: SHX5443

## 2021-08-01 LAB — HEMOGLOBIN AND HEMATOCRIT, BLOOD
HCT: 28.4 % — ABNORMAL LOW (ref 39.0–52.0)
Hemoglobin: 9.9 g/dL — ABNORMAL LOW (ref 13.0–17.0)

## 2021-08-01 LAB — POCT I-STAT 7, (LYTES, BLD GAS, ICA,H+H)
Acid-Base Excess: 0 mmol/L (ref 0.0–2.0)
Acid-base deficit: 3 mmol/L — ABNORMAL HIGH (ref 0.0–2.0)
Acid-base deficit: 4 mmol/L — ABNORMAL HIGH (ref 0.0–2.0)
Acid-base deficit: 3 mmol/L — ABNORMAL HIGH (ref 0.0–2.0)
Acid-base deficit: 2 mmol/L (ref 0.0–2.0)
Acid-base deficit: 6 mmol/L — ABNORMAL HIGH (ref 0.0–2.0)
Acid-base deficit: 7 mmol/L — ABNORMAL HIGH (ref 0.0–2.0)
Acid-base deficit: 4 mmol/L — ABNORMAL HIGH (ref 0.0–2.0)
Acid-base deficit: 2 mmol/L (ref 0.0–2.0)
Acid-base deficit: 4 mmol/L — ABNORMAL HIGH (ref 0.0–2.0)
Acid-base deficit: 3 mmol/L — ABNORMAL HIGH (ref 0.0–2.0)
Bicarbonate: 21.3 mmol/L (ref 20.0–28.0)
Bicarbonate: 20.4 mmol/L (ref 20.0–28.0)
Bicarbonate: 23.3 mmol/L (ref 20.0–28.0)
Bicarbonate: 25 mmol/L (ref 20.0–28.0)
Bicarbonate: 19 mmol/L — ABNORMAL LOW (ref 20.0–28.0)
Bicarbonate: 22.9 mmol/L (ref 20.0–28.0)
Bicarbonate: 19.7 mmol/L — ABNORMAL LOW (ref 20.0–28.0)
Bicarbonate: 23.1 mmol/L (ref 20.0–28.0)
Bicarbonate: 23.9 mmol/L (ref 20.0–28.0)
Bicarbonate: 22.3 mmol/L (ref 20.0–28.0)
Bicarbonate: 23.2 mmol/L (ref 20.0–28.0)
Calcium, Ion: 1.23 mmol/L (ref 1.15–1.40)
Calcium, Ion: 1.2 mmol/L (ref 1.15–1.40)
Calcium, Ion: 1.05 mmol/L — ABNORMAL LOW (ref 1.15–1.40)
Calcium, Ion: 1.12 mmol/L — ABNORMAL LOW (ref 1.15–1.40)
Calcium, Ion: 1.12 mmol/L — ABNORMAL LOW (ref 1.15–1.40)
Calcium, Ion: 1.12 mmol/L — ABNORMAL LOW (ref 1.15–1.40)
Calcium, Ion: 1.16 mmol/L (ref 1.15–1.40)
Calcium, Ion: 1.13 mmol/L — ABNORMAL LOW (ref 1.15–1.40)
Calcium, Ion: 1.12 mmol/L — ABNORMAL LOW (ref 1.15–1.40)
Calcium, Ion: 1.12 mmol/L — ABNORMAL LOW (ref 1.15–1.40)
Calcium, Ion: 1.11 mmol/L — ABNORMAL LOW (ref 1.15–1.40)
HCT: 28 % — ABNORMAL LOW (ref 39.0–52.0)
HCT: 19 % — ABNORMAL LOW (ref 39.0–52.0)
HCT: 26 % — ABNORMAL LOW (ref 39.0–52.0)
HCT: 24 % — ABNORMAL LOW (ref 39.0–52.0)
HCT: 24 % — ABNORMAL LOW (ref 39.0–52.0)
HCT: 25 % — ABNORMAL LOW (ref 39.0–52.0)
HCT: 32 % — ABNORMAL LOW (ref 39.0–52.0)
HCT: 32 % — ABNORMAL LOW (ref 39.0–52.0)
HCT: 32 % — ABNORMAL LOW (ref 39.0–52.0)
HCT: 30 % — ABNORMAL LOW (ref 39.0–52.0)
HCT: 29 % — ABNORMAL LOW (ref 39.0–52.0)
Hemoglobin: 9.5 g/dL — ABNORMAL LOW (ref 13.0–17.0)
Hemoglobin: 6.5 g/dL — CL (ref 13.0–17.0)
Hemoglobin: 8.8 g/dL — ABNORMAL LOW (ref 13.0–17.0)
Hemoglobin: 8.2 g/dL — ABNORMAL LOW (ref 13.0–17.0)
Hemoglobin: 8.2 g/dL — ABNORMAL LOW (ref 13.0–17.0)
Hemoglobin: 8.5 g/dL — ABNORMAL LOW (ref 13.0–17.0)
Hemoglobin: 10.9 g/dL — ABNORMAL LOW (ref 13.0–17.0)
Hemoglobin: 10.9 g/dL — ABNORMAL LOW (ref 13.0–17.0)
Hemoglobin: 10.9 g/dL — ABNORMAL LOW (ref 13.0–17.0)
Hemoglobin: 10.2 g/dL — ABNORMAL LOW (ref 13.0–17.0)
Hemoglobin: 9.9 g/dL — ABNORMAL LOW (ref 13.0–17.0)
O2 Saturation: 88 %
O2 Saturation: 100 %
O2 Saturation: 100 %
O2 Saturation: 100 %
O2 Saturation: 100 %
O2 Saturation: 100 %
O2 Saturation: 94 %
O2 Saturation: 97 %
O2 Saturation: 96 %
O2 Saturation: 95 %
O2 Saturation: 96 %
Patient temperature: 98.6
Patient temperature: 35.2
Patient temperature: 35.9
Patient temperature: 37
Patient temperature: 36.6
Patient temperature: 36.5
Potassium: 4.1 mmol/L (ref 3.5–5.1)
Potassium: 4 mmol/L (ref 3.5–5.1)
Potassium: 4.6 mmol/L (ref 3.5–5.1)
Potassium: 5.1 mmol/L (ref 3.5–5.1)
Potassium: 4.5 mmol/L (ref 3.5–5.1)
Potassium: 5.5 mmol/L — ABNORMAL HIGH (ref 3.5–5.1)
Potassium: 4.2 mmol/L (ref 3.5–5.1)
Potassium: 4.2 mmol/L (ref 3.5–5.1)
Potassium: 4.3 mmol/L (ref 3.5–5.1)
Potassium: 4.1 mmol/L (ref 3.5–5.1)
Potassium: 4.1 mmol/L (ref 3.5–5.1)
Sodium: 134 mmol/L — ABNORMAL LOW (ref 135–145)
Sodium: 136 mmol/L (ref 135–145)
Sodium: 136 mmol/L (ref 135–145)
Sodium: 135 mmol/L (ref 135–145)
Sodium: 134 mmol/L — ABNORMAL LOW (ref 135–145)
Sodium: 135 mmol/L (ref 135–145)
Sodium: 135 mmol/L (ref 135–145)
Sodium: 138 mmol/L (ref 135–145)
Sodium: 138 mmol/L (ref 135–145)
Sodium: 138 mmol/L (ref 135–145)
Sodium: 137 mmol/L (ref 135–145)
TCO2: 22 mmol/L (ref 22–32)
TCO2: 21 mmol/L — ABNORMAL LOW (ref 22–32)
TCO2: 25 mmol/L (ref 22–32)
TCO2: 26 mmol/L (ref 22–32)
TCO2: 20 mmol/L — ABNORMAL LOW (ref 22–32)
TCO2: 24 mmol/L (ref 22–32)
TCO2: 21 mmol/L — ABNORMAL LOW (ref 22–32)
TCO2: 24 mmol/L (ref 22–32)
TCO2: 25 mmol/L (ref 22–32)
TCO2: 24 mmol/L (ref 22–32)
TCO2: 24 mmol/L (ref 22–32)
pCO2 arterial: 32.1 mmHg (ref 32.0–48.0)
pCO2 arterial: 32.7 mmHg (ref 32.0–48.0)
pCO2 arterial: 45.5 mmHg (ref 32.0–48.0)
pCO2 arterial: 43.5 mmHg (ref 32.0–48.0)
pCO2 arterial: 35 mmHg (ref 32.0–48.0)
pCO2 arterial: 40.5 mmHg (ref 32.0–48.0)
pCO2 arterial: 38.5 mmHg (ref 32.0–48.0)
pCO2 arterial: 45 mmHg (ref 32.0–48.0)
pCO2 arterial: 45.1 mmHg (ref 32.0–48.0)
pCO2 arterial: 43.5 mmHg (ref 32.0–48.0)
pCO2 arterial: 43.5 mmHg (ref 32.0–48.0)
pH, Arterial: 7.429 (ref 7.350–7.450)
pH, Arterial: 7.402 (ref 7.350–7.450)
pH, Arterial: 7.317 — ABNORMAL LOW (ref 7.350–7.450)
pH, Arterial: 7.367 (ref 7.350–7.450)
pH, Arterial: 7.344 — ABNORMAL LOW (ref 7.350–7.450)
pH, Arterial: 7.359 (ref 7.350–7.450)
pH, Arterial: 7.308 — ABNORMAL LOW (ref 7.350–7.450)
pH, Arterial: 7.312 — ABNORMAL LOW (ref 7.350–7.450)
pH, Arterial: 7.332 — ABNORMAL LOW (ref 7.350–7.450)
pH, Arterial: 7.316 — ABNORMAL LOW (ref 7.350–7.450)
pH, Arterial: 7.332 — ABNORMAL LOW (ref 7.350–7.450)
pO2, Arterial: 52 mmHg — ABNORMAL LOW (ref 83.0–108.0)
pO2, Arterial: 261 mmHg — ABNORMAL HIGH (ref 83.0–108.0)
pO2, Arterial: 389 mmHg — ABNORMAL HIGH (ref 83.0–108.0)
pO2, Arterial: 399 mmHg — ABNORMAL HIGH (ref 83.0–108.0)
pO2, Arterial: 198 mmHg — ABNORMAL HIGH (ref 83.0–108.0)
pO2, Arterial: 340 mmHg — ABNORMAL HIGH (ref 83.0–108.0)
pO2, Arterial: 73 mmHg — ABNORMAL LOW (ref 83.0–108.0)
pO2, Arterial: 89 mmHg (ref 83.0–108.0)
pO2, Arterial: 87 mmHg (ref 83.0–108.0)
pO2, Arterial: 82 mmHg — ABNORMAL LOW (ref 83.0–108.0)
pO2, Arterial: 87 mmHg (ref 83.0–108.0)

## 2021-08-01 LAB — POCT I-STAT, CHEM 8
BUN: 29 mg/dL — ABNORMAL HIGH (ref 8–23)
BUN: 31 mg/dL — ABNORMAL HIGH (ref 8–23)
BUN: 31 mg/dL — ABNORMAL HIGH (ref 8–23)
BUN: 29 mg/dL — ABNORMAL HIGH (ref 8–23)
BUN: 29 mg/dL — ABNORMAL HIGH (ref 8–23)
Calcium, Ion: 1.23 mmol/L (ref 1.15–1.40)
Calcium, Ion: 1.16 mmol/L (ref 1.15–1.40)
Calcium, Ion: 1.08 mmol/L — ABNORMAL LOW (ref 1.15–1.40)
Calcium, Ion: 1.11 mmol/L — ABNORMAL LOW (ref 1.15–1.40)
Calcium, Ion: 1.11 mmol/L — ABNORMAL LOW (ref 1.15–1.40)
Chloride: 104 mmol/L (ref 98–111)
Chloride: 103 mmol/L (ref 98–111)
Chloride: 102 mmol/L (ref 98–111)
Chloride: 103 mmol/L (ref 98–111)
Chloride: 102 mmol/L (ref 98–111)
Creatinine, Ser: 1 mg/dL (ref 0.61–1.24)
Creatinine, Ser: 1.1 mg/dL (ref 0.61–1.24)
Creatinine, Ser: 0.9 mg/dL (ref 0.61–1.24)
Creatinine, Ser: 0.9 mg/dL (ref 0.61–1.24)
Creatinine, Ser: 1 mg/dL (ref 0.61–1.24)
Glucose, Bld: 97 mg/dL (ref 70–99)
Glucose, Bld: 115 mg/dL — ABNORMAL HIGH (ref 70–99)
Glucose, Bld: 132 mg/dL — ABNORMAL HIGH (ref 70–99)
Glucose, Bld: 130 mg/dL — ABNORMAL HIGH (ref 70–99)
Glucose, Bld: 150 mg/dL — ABNORMAL HIGH (ref 70–99)
HCT: 22 % — ABNORMAL LOW (ref 39.0–52.0)
HCT: 22 % — ABNORMAL LOW (ref 39.0–52.0)
HCT: 25 % — ABNORMAL LOW (ref 39.0–52.0)
HCT: 26 % — ABNORMAL LOW (ref 39.0–52.0)
HCT: 27 % — ABNORMAL LOW (ref 39.0–52.0)
Hemoglobin: 7.5 g/dL — ABNORMAL LOW (ref 13.0–17.0)
Hemoglobin: 7.5 g/dL — ABNORMAL LOW (ref 13.0–17.0)
Hemoglobin: 8.5 g/dL — ABNORMAL LOW (ref 13.0–17.0)
Hemoglobin: 8.8 g/dL — ABNORMAL LOW (ref 13.0–17.0)
Hemoglobin: 9.2 g/dL — ABNORMAL LOW (ref 13.0–17.0)
Potassium: 4 mmol/L (ref 3.5–5.1)
Potassium: 4.2 mmol/L (ref 3.5–5.1)
Potassium: 5.3 mmol/L — ABNORMAL HIGH (ref 3.5–5.1)
Potassium: 5.4 mmol/L — ABNORMAL HIGH (ref 3.5–5.1)
Potassium: 4.5 mmol/L (ref 3.5–5.1)
Sodium: 135 mmol/L (ref 135–145)
Sodium: 134 mmol/L — ABNORMAL LOW (ref 135–145)
Sodium: 135 mmol/L (ref 135–145)
Sodium: 134 mmol/L — ABNORMAL LOW (ref 135–145)
Sodium: 135 mmol/L (ref 135–145)
TCO2: 22 mmol/L (ref 22–32)
TCO2: 22 mmol/L (ref 22–32)
TCO2: 24 mmol/L (ref 22–32)
TCO2: 24 mmol/L (ref 22–32)
TCO2: 22 mmol/L (ref 22–32)

## 2021-08-01 LAB — CBC
HCT: 24.6 % — ABNORMAL LOW (ref 39.0–52.0)
HCT: 32.1 % — ABNORMAL LOW (ref 39.0–52.0)
HCT: 30.9 % — ABNORMAL LOW (ref 39.0–52.0)
Hemoglobin: 8.6 g/dL — ABNORMAL LOW (ref 13.0–17.0)
Hemoglobin: 10.7 g/dL — ABNORMAL LOW (ref 13.0–17.0)
Hemoglobin: 10.4 g/dL — ABNORMAL LOW (ref 13.0–17.0)
MCH: 34.4 pg — ABNORMAL HIGH (ref 26.0–34.0)
MCH: 31.7 pg (ref 26.0–34.0)
MCH: 31.5 pg (ref 26.0–34.0)
MCHC: 35 g/dL (ref 30.0–36.0)
MCHC: 33.3 g/dL (ref 30.0–36.0)
MCHC: 33.7 g/dL (ref 30.0–36.0)
MCV: 98.4 fL (ref 80.0–100.0)
MCV: 95 fL (ref 80.0–100.0)
MCV: 93.6 fL (ref 80.0–100.0)
Platelets: 151 10*3/uL (ref 150–400)
Platelets: 125 10*3/uL — ABNORMAL LOW (ref 150–400)
Platelets: 146 10*3/uL — ABNORMAL LOW (ref 150–400)
RBC: 2.5 MIL/uL — ABNORMAL LOW (ref 4.22–5.81)
RBC: 3.38 MIL/uL — ABNORMAL LOW (ref 4.22–5.81)
RBC: 3.3 MIL/uL — ABNORMAL LOW (ref 4.22–5.81)
RDW: 13.6 % (ref 11.5–15.5)
RDW: 15.9 % — ABNORMAL HIGH (ref 11.5–15.5)
RDW: 15.9 % — ABNORMAL HIGH (ref 11.5–15.5)
WBC: 7.8 10*3/uL (ref 4.0–10.5)
WBC: 10.8 10*3/uL — ABNORMAL HIGH (ref 4.0–10.5)
WBC: 9.3 10*3/uL (ref 4.0–10.5)
nRBC: 0.3 % — ABNORMAL HIGH (ref 0.0–0.2)
nRBC: 0.2 % (ref 0.0–0.2)
nRBC: 0.2 % (ref 0.0–0.2)

## 2021-08-01 LAB — POCT I-STAT EG7
Acid-base deficit: 4 mmol/L — ABNORMAL HIGH (ref 0.0–2.0)
Bicarbonate: 21.9 mmol/L (ref 20.0–28.0)
Calcium, Ion: 1.1 mmol/L — ABNORMAL LOW (ref 1.15–1.40)
HCT: 26 % — ABNORMAL LOW (ref 39.0–52.0)
Hemoglobin: 8.8 g/dL — ABNORMAL LOW (ref 13.0–17.0)
O2 Saturation: 81 %
Potassium: 4.5 mmol/L (ref 3.5–5.1)
Sodium: 136 mmol/L (ref 135–145)
TCO2: 23 mmol/L (ref 22–32)
pCO2, Ven: 44.6 mmHg (ref 44.0–60.0)
pH, Ven: 7.299 (ref 7.250–7.430)
pO2, Ven: 51 mmHg — ABNORMAL HIGH (ref 32.0–45.0)

## 2021-08-01 LAB — BASIC METABOLIC PANEL
Anion gap: 8 (ref 5–15)
Anion gap: 10 (ref 5–15)
BUN: 38 mg/dL — ABNORMAL HIGH (ref 8–23)
BUN: 30 mg/dL — ABNORMAL HIGH (ref 8–23)
CO2: 20 mmol/L — ABNORMAL LOW (ref 22–32)
CO2: 21 mmol/L — ABNORMAL LOW (ref 22–32)
Calcium: 8.3 mg/dL — ABNORMAL LOW (ref 8.9–10.3)
Calcium: 7.5 mg/dL — ABNORMAL LOW (ref 8.9–10.3)
Chloride: 104 mmol/L (ref 98–111)
Chloride: 104 mmol/L (ref 98–111)
Creatinine, Ser: 1.14 mg/dL (ref 0.61–1.24)
Creatinine, Ser: 1.36 mg/dL — ABNORMAL HIGH (ref 0.61–1.24)
GFR, Estimated: >60 mL/min (ref >60)
GFR, Estimated: 51 mL/min — ABNORMAL LOW (ref >60)
Glucose, Bld: 109 mg/dL — ABNORMAL HIGH (ref 70–99)
Glucose, Bld: 138 mg/dL — ABNORMAL HIGH (ref 70–99)
Potassium: 4.2 mmol/L (ref 3.5–5.1)
Potassium: 4.1 mmol/L (ref 3.5–5.1)
Sodium: 132 mmol/L — ABNORMAL LOW (ref 135–145)
Sodium: 135 mmol/L (ref 135–145)

## 2021-08-01 LAB — MAGNESIUM: Magnesium: 3.1 mg/dL — ABNORMAL HIGH (ref 1.7–2.4)

## 2021-08-01 LAB — PLATELET COUNT: Platelets: 126 10*3/uL — ABNORMAL LOW (ref 150–400)

## 2021-08-01 LAB — GLUCOSE, CAPILLARY
Glucose-Capillary: 133 mg/dL — ABNORMAL HIGH (ref 70–99)
Glucose-Capillary: 135 mg/dL — ABNORMAL HIGH (ref 70–99)
Glucose-Capillary: 137 mg/dL — ABNORMAL HIGH (ref 70–99)
Glucose-Capillary: 139 mg/dL — ABNORMAL HIGH (ref 70–99)
Glucose-Capillary: 142 mg/dL — ABNORMAL HIGH (ref 70–99)
Glucose-Capillary: 148 mg/dL — ABNORMAL HIGH (ref 70–99)
Glucose-Capillary: 152 mg/dL — ABNORMAL HIGH (ref 70–99)
Glucose-Capillary: 183 mg/dL — ABNORMAL HIGH (ref 70–99)
Glucose-Capillary: 183 mg/dL — ABNORMAL HIGH (ref 70–99)
Glucose-Capillary: 194 mg/dL — ABNORMAL HIGH (ref 70–99)

## 2021-08-01 LAB — PREPARE RBC (CROSSMATCH)

## 2021-08-01 LAB — APTT: aPTT: 35 seconds (ref 24–36)

## 2021-08-01 LAB — HEPARIN LEVEL (UNFRACTIONATED): Heparin Unfractionated: 0.27 IU/mL — ABNORMAL LOW (ref 0.30–0.70)

## 2021-08-01 LAB — PROTIME-INR
INR: 1.5 — ABNORMAL HIGH (ref 0.8–1.2)
Prothrombin Time: 18.2 seconds — ABNORMAL HIGH (ref 11.4–15.2)

## 2021-08-01 SURGERY — CORONARY ARTERY BYPASS GRAFTING (CABG)
Anesthesia: General | Site: Chest | Laterality: Right

## 2021-08-01 MED ORDER — SODIUM CHLORIDE 0.9% FLUSH
3.0000 mL | Freq: Two times a day (BID) | INTRAVENOUS | Status: DC
  Administered 2021-08-02 – 2021-08-10 (×13): 3 mL via INTRAVENOUS

## 2021-08-01 MED ORDER — HEPARIN SODIUM (PORCINE) 1000 UNIT/ML IJ SOLN
INTRAMUSCULAR | Status: DC | PRN
Start: 2021-08-01 — End: 2021-08-01
  Administered 2021-08-01: 25000 [IU] via INTRAVENOUS

## 2021-08-01 MED ORDER — THROMBIN 20000 UNITS EX SOLR
CUTANEOUS | Status: DC | PRN
  Administered 2021-08-01: 20000 [IU] via TOPICAL

## 2021-08-01 MED ORDER — METOPROLOL TARTRATE 12.5 MG HALF TABLET: 12.5000 mg | ORAL_TABLET | Freq: Two times a day (BID) | ORAL | Status: DC

## 2021-08-01 MED ORDER — MIDAZOLAM HCL (PF) 10 MG/2ML IJ SOLN
INTRAMUSCULAR | Status: AC
  Filled 2021-08-01: qty 2

## 2021-08-01 MED ORDER — 0.9 % SODIUM CHLORIDE (POUR BTL) OPTIME
TOPICAL | Status: DC | PRN
Start: 2021-08-01 — End: 2021-08-01
  Administered 2021-08-01: 5000 mL

## 2021-08-01 MED ORDER — METOPROLOL TARTRATE 5 MG/5ML IV SOLN: 2.5000 mg | INTRAVENOUS | Status: DC | PRN

## 2021-08-01 MED ORDER — BISACODYL 10 MG RE SUPP: 10.0000 mg | Freq: Every day | RECTAL | Status: DC

## 2021-08-01 MED ORDER — MORPHINE SULFATE (PF) 2 MG/ML IV SOLN
1.0000 mg | INTRAVENOUS | Status: DC | PRN
  Administered 2021-08-02: 06:00:00 4 mg via INTRAVENOUS
  Administered 2021-08-02 (×2): 2 mg via INTRAVENOUS
  Filled 2021-08-01 (×3): qty 1
  Filled 2021-08-01: qty 2

## 2021-08-01 MED ORDER — FENTANYL CITRATE (PF) 250 MCG/5ML IJ SOLN
INTRAMUSCULAR | Status: AC
  Filled 2021-08-01: qty 5

## 2021-08-01 MED ORDER — SODIUM BICARBONATE 8.4 % IV SOLN
INTRAVENOUS | Status: AC
  Administered 2021-08-01: 50 meq via INTRAVENOUS
  Filled 2021-08-01: qty 50

## 2021-08-01 MED ORDER — CHLORHEXIDINE GLUCONATE 0.12% ORAL RINSE (MEDLINE KIT)
15.0000 mL | Freq: Two times a day (BID) | OROMUCOSAL | Status: DC
  Administered 2021-08-01: 15 mL via OROMUCOSAL

## 2021-08-01 MED ORDER — LACTATED RINGERS IV SOLN: 500.0000 mL | Freq: Once | INTRAVENOUS | Status: DC | PRN

## 2021-08-01 MED ORDER — VANCOMYCIN HCL IN DEXTROSE 1-5 GM/200ML-% IV SOLN
1000.0000 mg | Freq: Once | INTRAVENOUS | Status: AC
  Administered 2021-08-01: 1000 mg via INTRAVENOUS
  Filled 2021-08-01: qty 200

## 2021-08-01 MED ORDER — ALBUMIN HUMAN 5 % IV SOLN
250.0000 mL | INTRAVENOUS | Status: DC | PRN
  Administered 2021-08-01 (×3): 12.5 g via INTRAVENOUS
  Filled 2021-08-01: qty 250

## 2021-08-01 MED ORDER — FENTANYL CITRATE (PF) 250 MCG/5ML IJ SOLN
INTRAMUSCULAR | Status: DC | PRN
Start: 2021-08-01 — End: 2021-08-01
  Administered 2021-08-01: 100 ug via INTRAVENOUS
  Administered 2021-08-01: 50 ug via INTRAVENOUS
  Administered 2021-08-01: 150 ug via INTRAVENOUS
  Administered 2021-08-01: 250 ug via INTRAVENOUS
  Administered 2021-08-01: 200 ug via INTRAVENOUS
  Administered 2021-08-01 (×2): 25 ug via INTRAVENOUS
  Administered 2021-08-01: 150 ug via INTRAVENOUS

## 2021-08-01 MED ORDER — MIDAZOLAM HCL (PF) 5 MG/ML IJ SOLN
INTRAMUSCULAR | Status: DC | PRN
  Administered 2021-08-01: .5 mg via INTRAVENOUS
  Administered 2021-08-01: 1.5 mg via INTRAVENOUS
  Administered 2021-08-01: 3 mg via INTRAVENOUS

## 2021-08-01 MED ORDER — POTASSIUM CHLORIDE 10 MEQ/50ML IV SOLN: 10.0000 meq | INTRAVENOUS | Status: AC

## 2021-08-01 MED ORDER — SODIUM CHLORIDE 0.9% FLUSH
10.0000 mL | Freq: Two times a day (BID) | INTRAVENOUS | Status: DC
  Administered 2021-08-02 – 2021-08-08 (×7): 10 mL
  Administered 2021-08-09: 20 mL

## 2021-08-01 MED ORDER — CHLORHEXIDINE GLUCONATE 0.12 % MT SOLN
15.0000 mL | OROMUCOSAL | Status: AC
  Administered 2021-08-01: 15 mL via OROMUCOSAL

## 2021-08-01 MED ORDER — FAMOTIDINE IN NACL 20-0.9 MG/50ML-% IV SOLN
20.0000 mg | Freq: Two times a day (BID) | INTRAVENOUS | Status: AC
  Administered 2021-08-01: 20 mg via INTRAVENOUS
  Filled 2021-08-01: qty 50

## 2021-08-01 MED ORDER — THROMBIN 20000 UNITS EX SOLR
CUTANEOUS | Status: DC | PRN
  Administered 2021-08-01: 4 mL via TOPICAL

## 2021-08-01 MED ORDER — PROTAMINE SULFATE 10 MG/ML IV SOLN
INTRAVENOUS | Status: DC | PRN
Start: 2021-08-01 — End: 2021-08-01
  Administered 2021-08-01: 250 mg via INTRAVENOUS

## 2021-08-01 MED ORDER — DEXTROSE 50 % IV SOLN: 0.0000 mL | INTRAVENOUS | Status: DC | PRN

## 2021-08-01 MED ORDER — MILRINONE LACTATE IN DEXTROSE 20-5 MG/100ML-% IV SOLN
0.2500 ug/kg/min | INTRAVENOUS | Status: DC
  Administered 2021-08-02: 04:00:00 0.25 ug/kg/min via INTRAVENOUS
  Filled 2021-08-01: qty 100

## 2021-08-01 MED ORDER — ONDANSETRON HCL 4 MG/2ML IJ SOLN
4.0000 mg | Freq: Four times a day (QID) | INTRAMUSCULAR | Status: DC | PRN
  Administered 2021-08-02 – 2021-08-08 (×6): 4 mg via INTRAVENOUS
  Filled 2021-08-01 (×7): qty 2

## 2021-08-01 MED ORDER — EPINEPHRINE 1 MG/10ML IJ SOSY
PREFILLED_SYRINGE | INTRAMUSCULAR | Status: DC | PRN
Start: 2021-08-01 — End: 2021-08-01
  Administered 2021-08-01: .1 mg via INTRAVENOUS
  Administered 2021-08-01: .2 mg via INTRAVENOUS

## 2021-08-01 MED ORDER — ASPIRIN EC 325 MG PO TBEC
325.0000 mg | DELAYED_RELEASE_TABLET | Freq: Every day | ORAL | Status: DC
  Administered 2021-08-02 – 2021-08-10 (×8): 325 mg via ORAL
  Filled 2021-08-01 (×9): qty 1

## 2021-08-01 MED ORDER — ACETAMINOPHEN 650 MG RE SUPP
650.0000 mg | Freq: Once | RECTAL | Status: AC
  Administered 2021-08-01: 650 mg via RECTAL

## 2021-08-01 MED ORDER — METOPROLOL TARTRATE 25 MG/10 ML ORAL SUSPENSION: 12.5000 mg | Freq: Two times a day (BID) | ORAL | Status: DC

## 2021-08-01 MED ORDER — SODIUM CHLORIDE 0.9% FLUSH: 3.0000 mL | INTRAVENOUS | Status: DC | PRN

## 2021-08-01 MED ORDER — HEPARIN SODIUM (PORCINE) 1000 UNIT/ML IJ SOLN
INTRAMUSCULAR | Status: AC
  Filled 2021-08-01: qty 1

## 2021-08-01 MED ORDER — ACETAMINOPHEN 160 MG/5ML PO SOLN
650.0000 mg | Freq: Once | ORAL | Status: AC
Start: 2021-08-01 — End: 2021-08-01

## 2021-08-01 MED ORDER — PROPOFOL 10 MG/ML IV BOLUS
INTRAVENOUS | Status: AC
  Filled 2021-08-01: qty 20

## 2021-08-01 MED ORDER — ORAL CARE MOUTH RINSE
15.0000 mL | Freq: Two times a day (BID) | OROMUCOSAL | Status: DC
  Administered 2021-08-01 – 2021-08-09 (×16): 15 mL via OROMUCOSAL

## 2021-08-01 MED ORDER — CHLORHEXIDINE GLUCONATE CLOTH 2 % EX PADS
6.0000 | MEDICATED_PAD | Freq: Every day | CUTANEOUS | Status: DC
  Administered 2021-08-01 – 2021-08-09 (×9): 6 via TOPICAL

## 2021-08-01 MED ORDER — PROPOFOL 10 MG/ML IV BOLUS
INTRAVENOUS | Status: DC | PRN
  Administered 2021-08-01: 150 mg via INTRAVENOUS
  Administered 2021-08-01: 25 mg via INTRAVENOUS

## 2021-08-01 MED ORDER — ACETAMINOPHEN 160 MG/5ML PO SOLN
1000.0000 mg | Freq: Four times a day (QID) | ORAL | Status: AC
  Administered 2021-08-04 (×2): 1000 mg
  Filled 2021-08-01 (×2): qty 40.6

## 2021-08-01 MED ORDER — SODIUM CHLORIDE 0.9% FLUSH: 10.0000 mL | INTRAVENOUS | Status: DC | PRN

## 2021-08-01 MED ORDER — MIDAZOLAM HCL 2 MG/2ML IJ SOLN: 2.0000 mg | INTRAMUSCULAR | Status: DC | PRN

## 2021-08-01 MED ORDER — PHENYLEPHRINE 40 MCG/ML (10ML) SYRINGE FOR IV PUSH (FOR BLOOD PRESSURE SUPPORT)
PREFILLED_SYRINGE | INTRAVENOUS | Status: DC | PRN
  Administered 2021-08-01: 200 ug via INTRAVENOUS
  Administered 2021-08-01: 120 ug via INTRAVENOUS
  Administered 2021-08-01: 80 ug via INTRAVENOUS

## 2021-08-01 MED ORDER — ROCURONIUM BROMIDE 10 MG/ML (PF) SYRINGE
PREFILLED_SYRINGE | INTRAVENOUS | Status: DC | PRN
  Administered 2021-08-01: 50 mg via INTRAVENOUS
  Administered 2021-08-01: 20 mg via INTRAVENOUS
  Administered 2021-08-01: 50 mg via INTRAVENOUS

## 2021-08-01 MED ORDER — LIDOCAINE 2% (20 MG/ML) 5 ML SYRINGE
INTRAMUSCULAR | Status: AC
  Filled 2021-08-01: qty 5

## 2021-08-01 MED ORDER — THROMBIN (RECOMBINANT) 20000 UNITS EX SOLR
CUTANEOUS | Status: AC
  Filled 2021-08-01: qty 20000

## 2021-08-01 MED ORDER — DEXMEDETOMIDINE HCL IN NACL 400 MCG/100ML IV SOLN: 0.0000 ug/kg/h | INTRAVENOUS | Status: DC

## 2021-08-01 MED ORDER — CEFAZOLIN SODIUM-DEXTROSE 2-4 GM/100ML-% IV SOLN
2.0000 g | Freq: Three times a day (TID) | INTRAVENOUS | Status: AC
  Administered 2021-08-01 – 2021-08-03 (×6): 2 g via INTRAVENOUS
  Filled 2021-08-01 (×5): qty 100

## 2021-08-01 MED ORDER — PHENYLEPHRINE 40 MCG/ML (10ML) SYRINGE FOR IV PUSH (FOR BLOOD PRESSURE SUPPORT)
PREFILLED_SYRINGE | INTRAVENOUS | Status: AC
  Filled 2021-08-01: qty 20

## 2021-08-01 MED ORDER — LACTATED RINGERS IV SOLN: INTRAVENOUS | Status: DC

## 2021-08-01 MED ORDER — SODIUM BICARBONATE 8.4 % IV SOLN: 50.0000 meq | Freq: Once | INTRAVENOUS | Status: AC

## 2021-08-01 MED ORDER — INSULIN REGULAR(HUMAN) IN NACL 100-0.9 UT/100ML-% IV SOLN: INTRAVENOUS | Status: DC

## 2021-08-01 MED ORDER — ALBUMIN HUMAN 5 % IV SOLN: INTRAVENOUS | Status: DC | PRN

## 2021-08-01 MED ORDER — LACTATED RINGERS IV SOLN: INTRAVENOUS | Status: DC | PRN

## 2021-08-01 MED ORDER — HEMOSTATIC AGENTS (NO CHARGE) OPTIME
TOPICAL | Status: DC | PRN
  Administered 2021-08-01: 1 via TOPICAL

## 2021-08-01 MED ORDER — SODIUM CHLORIDE 0.9 % IV SOLN: INTRAVENOUS | Status: DC

## 2021-08-01 MED ORDER — BISACODYL 5 MG PO TBEC
10.0000 mg | DELAYED_RELEASE_TABLET | Freq: Every day | ORAL | Status: DC
  Administered 2021-08-02 – 2021-08-10 (×7): 10 mg via ORAL
  Filled 2021-08-01 (×8): qty 2

## 2021-08-01 MED ORDER — SODIUM BICARBONATE 8.4 % IV SOLN
100.0000 meq | Freq: Once | INTRAVENOUS | Status: AC
  Administered 2021-08-01: 100 meq via INTRAVENOUS

## 2021-08-01 MED ORDER — DIPHENHYDRAMINE HCL 50 MG/ML IJ SOLN
INTRAMUSCULAR | Status: DC | PRN
  Administered 2021-08-01: 25 mg via INTRAVENOUS

## 2021-08-01 MED ORDER — PHENYLEPHRINE HCL-NACL 20-0.9 MG/250ML-% IV SOLN
0.0000 ug/min | INTRAVENOUS | Status: DC
  Administered 2021-08-01: 30 ug/min via INTRAVENOUS
  Administered 2021-08-02: 09:00:00 90 ug/min via INTRAVENOUS
  Administered 2021-08-02: 14:00:00 60 ug/min via INTRAVENOUS
  Filled 2021-08-01 (×3): qty 250

## 2021-08-01 MED ORDER — SODIUM CHLORIDE 0.9 % IV SOLN: 250.0000 mL | INTRAVENOUS | Status: DC

## 2021-08-01 MED ORDER — EPHEDRINE 5 MG/ML INJ
INTRAVENOUS | Status: AC
  Filled 2021-08-01: qty 10

## 2021-08-01 MED ORDER — TRAMADOL HCL 50 MG PO TABS: 50.0000 mg | ORAL_TABLET | ORAL | Status: DC | PRN

## 2021-08-01 MED ORDER — EPHEDRINE SULFATE-NACL 50-0.9 MG/10ML-% IV SOSY
PREFILLED_SYRINGE | INTRAVENOUS | Status: DC | PRN
  Administered 2021-08-01: 10 mg via INTRAVENOUS
  Administered 2021-08-01: 5 mg via INTRAVENOUS
  Administered 2021-08-01: 10 mg via INTRAVENOUS
  Administered 2021-08-01 (×2): 5 mg via INTRAVENOUS

## 2021-08-01 MED ORDER — LACTATED RINGERS IV SOLN
INTRAVENOUS | Status: DC | PRN
Start: 2021-08-01 — End: 2021-08-01

## 2021-08-01 MED ORDER — EPINEPHRINE HCL 5 MG/250ML IV SOLN IN NS
1.0000 ug/min | INTRAVENOUS | Status: DC
  Administered 2021-08-02: 10 ug/min via INTRAVENOUS
  Filled 2021-08-01: qty 250

## 2021-08-01 MED ORDER — MAGNESIUM SULFATE 4 GM/100ML IV SOLN
4.0000 g | Freq: Once | INTRAVENOUS | Status: AC
  Administered 2021-08-01: 4 g via INTRAVENOUS
  Filled 2021-08-01: qty 100

## 2021-08-01 MED ORDER — NITROGLYCERIN IN D5W 200-5 MCG/ML-% IV SOLN: 0.0000 ug/min | INTRAVENOUS | Status: DC

## 2021-08-01 MED ORDER — PLASMA-LYTE A IV SOLN
INTRAVENOUS | Status: DC | PRN
  Administered 2021-08-01: 1000 mL

## 2021-08-01 MED ORDER — ACETAMINOPHEN 500 MG PO TABS
1000.0000 mg | ORAL_TABLET | Freq: Four times a day (QID) | ORAL | Status: AC
  Administered 2021-08-01 – 2021-08-06 (×14): 1000 mg via ORAL
  Filled 2021-08-01 (×14): qty 2

## 2021-08-01 MED ORDER — ASPIRIN 81 MG PO CHEW
324.0000 mg | CHEWABLE_TABLET | Freq: Every day | ORAL | Status: DC
  Administered 2021-08-03: 10:00:00 324 mg
  Filled 2021-08-01 (×3): qty 4

## 2021-08-01 MED ORDER — PROTAMINE SULFATE 10 MG/ML IV SOLN
INTRAVENOUS | Status: AC
  Filled 2021-08-01: qty 25

## 2021-08-01 MED ORDER — SODIUM CHLORIDE 0.45 % IV SOLN: INTRAVENOUS | Status: DC | PRN

## 2021-08-01 MED ORDER — EPINEPHRINE 1 MG/10ML IJ SOSY
PREFILLED_SYRINGE | INTRAMUSCULAR | Status: AC
  Filled 2021-08-01: qty 10

## 2021-08-01 MED ORDER — OXYCODONE HCL 5 MG PO TABS
5.0000 mg | ORAL_TABLET | ORAL | Status: DC | PRN
  Administered 2021-08-02 – 2021-08-09 (×7): 10 mg via ORAL
  Filled 2021-08-01 (×7): qty 2

## 2021-08-01 MED ORDER — DOCUSATE SODIUM 100 MG PO CAPS
200.0000 mg | ORAL_CAPSULE | Freq: Every day | ORAL | Status: DC
  Administered 2021-08-02 – 2021-08-10 (×8): 200 mg via ORAL
  Filled 2021-08-01 (×9): qty 2

## 2021-08-01 MED ORDER — PANTOPRAZOLE SODIUM 40 MG PO TBEC
40.0000 mg | DELAYED_RELEASE_TABLET | Freq: Every day | ORAL | Status: DC
  Administered 2021-08-03 – 2021-08-10 (×8): 40 mg via ORAL
  Filled 2021-08-01 (×8): qty 1

## 2021-08-01 MED ORDER — ROCURONIUM BROMIDE 10 MG/ML (PF) SYRINGE
PREFILLED_SYRINGE | INTRAVENOUS | Status: AC
  Filled 2021-08-01: qty 20

## 2021-08-01 MED ORDER — ORAL CARE MOUTH RINSE
15.0000 mL | OROMUCOSAL | Status: DC
  Administered 2021-08-01: 15 mL via OROMUCOSAL

## 2021-08-01 MED ORDER — THROMBIN 20000 UNITS EX SOLR
CUTANEOUS | Status: DC | PRN
  Administered 2021-08-01 (×2): 4 mL via TOPICAL

## 2021-08-01 SURGICAL SUPPLY — 105 items
ADH SKN CLS APL DERMABOND .7 (GAUZE/BANDAGES/DRESSINGS) ×12
BAG DECANTER FOR FLEXI CONT (MISCELLANEOUS) ×5 IMPLANT
BLADE CLIPPER SURG (BLADE) ×5 IMPLANT
BLADE STERNUM SYSTEM 6 (BLADE) ×6 IMPLANT
BNDG ELASTIC 4X5.8 VLCR STR LF (GAUZE/BANDAGES/DRESSINGS) ×6 IMPLANT
BNDG ELASTIC 6X5.8 VLCR STR LF (GAUZE/BANDAGES/DRESSINGS) ×6 IMPLANT
BNDG GAUZE ELAST 4 BULKY (GAUZE/BANDAGES/DRESSINGS) ×6 IMPLANT
CANISTER SUCT 3000ML PPV (MISCELLANEOUS) ×5 IMPLANT
CATH ROBINSON RED A/P 18FR (CATHETERS) ×10 IMPLANT
CATH THORACIC 28FR (CATHETERS) ×6 IMPLANT
CATH THORACIC 36FR (CATHETERS) ×5 IMPLANT
CATH THORACIC 36FR RT ANG (CATHETERS) ×5 IMPLANT
CLIP TI WIDE RED SMALL 24 (CLIP) ×4 IMPLANT
CLIP VESOCCLUDE MED 24/CT (CLIP) IMPLANT
CLIP VESOCCLUDE SM WIDE 24/CT (CLIP) IMPLANT
CONTAINER PROTECT SURGISLUSH (MISCELLANEOUS) ×10 IMPLANT
DERMABOND ADVANCED (GAUZE/BANDAGES/DRESSINGS) ×3
DERMABOND ADVANCED .7 DNX12 (GAUZE/BANDAGES/DRESSINGS) IMPLANT
DRAPE CARDIOVASCULAR INCISE (DRAPES) ×5
DRAPE SRG 135X102X78XABS (DRAPES) ×4 IMPLANT
DRAPE WARM FLUID 44X44 (DRAPES) ×6 IMPLANT
DRSG COVADERM 4X14 (GAUZE/BANDAGES/DRESSINGS) ×5 IMPLANT
ELECT CAUTERY BLADE 6.4 (BLADE) ×6 IMPLANT
ELECT REM PT RETURN 9FT ADLT (ELECTROSURGICAL) ×10
ELECTRODE REM PT RTRN 9FT ADLT (ELECTROSURGICAL) ×10 IMPLANT
FELT TEFLON 1X6 (MISCELLANEOUS) ×9 IMPLANT
GAUZE 4X4 16PLY ~~LOC~~+RFID DBL (SPONGE) ×6 IMPLANT
GAUZE SPONGE 4X4 12PLY STRL (GAUZE/BANDAGES/DRESSINGS) ×10 IMPLANT
GAUZE SPONGE 4X4 12PLY STRL LF (GAUZE/BANDAGES/DRESSINGS) ×3 IMPLANT
GLOVE SURG ENC MOIS LTX SZ6 (GLOVE) IMPLANT
GLOVE SURG ENC MOIS LTX SZ6.5 (GLOVE) IMPLANT
GLOVE SURG ENC MOIS LTX SZ7 (GLOVE) IMPLANT
GLOVE SURG ENC MOIS LTX SZ7.5 (GLOVE) IMPLANT
GLOVE SURG MICRO LTX SZ6 (GLOVE) ×3 IMPLANT
GLOVE SURG MICRO LTX SZ6.5 (GLOVE) ×2 IMPLANT
GLOVE SURG MICRO LTX SZ7 (GLOVE) ×10 IMPLANT
GLOVE SURG MICRO LTX SZ7.5 (GLOVE) ×1 IMPLANT
GLOVE SURG ORTHO LTX SZ7.5 (GLOVE) IMPLANT
GLOVE SURG UNDER POLY LF SZ6 (GLOVE) ×1 IMPLANT
GLOVE SURG UNDER POLY LF SZ6.5 (GLOVE) IMPLANT
GLOVE SURG UNDER POLY LF SZ7 (GLOVE) IMPLANT
GOWN STRL REUS W/ TWL LRG LVL3 (GOWN DISPOSABLE) ×16 IMPLANT
GOWN STRL REUS W/ TWL XL LVL3 (GOWN DISPOSABLE) ×5 IMPLANT
GOWN STRL REUS W/TWL LRG LVL3 (GOWN DISPOSABLE) ×50
GOWN STRL REUS W/TWL XL LVL3 (GOWN DISPOSABLE) ×5
HEMOSTAT POWDER SURGIFOAM 1G (HEMOSTASIS) ×15 IMPLANT
HEMOSTAT SURGICEL 2X14 (HEMOSTASIS) ×5 IMPLANT
INSERT FOGARTY 61MM (MISCELLANEOUS) IMPLANT
INSERT FOGARTY XLG (MISCELLANEOUS) IMPLANT
KIT BASIN OR (CUSTOM PROCEDURE TRAY) ×5 IMPLANT
KIT CATH CPB BARTLE (MISCELLANEOUS) ×6 IMPLANT
KIT SUCTION CATH 14FR (SUCTIONS) ×6 IMPLANT
KIT TURNOVER KIT B (KITS) ×5 IMPLANT
KIT VASOVIEW HEMOPRO 2 VH 4000 (KITS) ×5 IMPLANT
NS IRRIG 1000ML POUR BTL (IV SOLUTION) ×25 IMPLANT
PACK E OPEN HEART (SUTURE) ×5 IMPLANT
PACK OPEN HEART (CUSTOM PROCEDURE TRAY) ×5 IMPLANT
PAD ARMBOARD 7.5X6 YLW CONV (MISCELLANEOUS) ×10 IMPLANT
PAD ELECT DEFIB RADIOL ZOLL (MISCELLANEOUS) ×5 IMPLANT
PENCIL BUTTON HOLSTER BLD 10FT (ELECTRODE) ×5 IMPLANT
POSITIONER HEAD DONUT 9IN (MISCELLANEOUS) ×6 IMPLANT
PUNCH AORTIC ROTATE 4.0MM (MISCELLANEOUS) IMPLANT
PUNCH AORTIC ROTATE 4.5MM 8IN (MISCELLANEOUS) ×6 IMPLANT
PUNCH AORTIC ROTATE 5MM 8IN (MISCELLANEOUS) IMPLANT
SET MPS 3-ND DEL (MISCELLANEOUS) ×1 IMPLANT
SPONGE INTESTINAL PEANUT (DISPOSABLE) IMPLANT
SPONGE T-LAP 18X18 ~~LOC~~+RFID (SPONGE) ×20 IMPLANT
SPONGE T-LAP 4X18 ~~LOC~~+RFID (SPONGE) ×12 IMPLANT
SUPPORT HEART JANKE-BARRON (MISCELLANEOUS) ×5 IMPLANT
SUT BONE WAX W31G (SUTURE) ×6 IMPLANT
SUT MNCRL AB 4-0 PS2 18 (SUTURE) IMPLANT
SUT PROLENE 3 0 SH DA (SUTURE) IMPLANT
SUT PROLENE 3 0 SH1 36 (SUTURE) ×5 IMPLANT
SUT PROLENE 4 0 RB 1 (SUTURE)
SUT PROLENE 4 0 SH DA (SUTURE) IMPLANT
SUT PROLENE 4-0 RB1 .5 CRCL 36 (SUTURE) IMPLANT
SUT PROLENE 5 0 C 1 36 (SUTURE) IMPLANT
SUT PROLENE 6 0 C 1 30 (SUTURE) IMPLANT
SUT PROLENE 7 0 BV 1 (SUTURE) IMPLANT
SUT PROLENE 7 0 BV1 MDA (SUTURE) ×6 IMPLANT
SUT PROLENE 8 0 BV175 6 (SUTURE) IMPLANT
SUT SILK  1 MH (SUTURE) ×5
SUT SILK 1 MH (SUTURE) IMPLANT
SUT SILK 2 0 SH (SUTURE) ×1 IMPLANT
SUT STEEL 6MS V (SUTURE) ×2 IMPLANT
SUT STEEL STERNAL CCS#1 18IN (SUTURE) IMPLANT
SUT STEEL SZ 6 DBL 3X14 BALL (SUTURE) IMPLANT
SUT VIC AB 1 CTX 36 (SUTURE) ×10
SUT VIC AB 1 CTX36XBRD ANBCTR (SUTURE) ×10 IMPLANT
SUT VIC AB 2-0 CT1 27 (SUTURE) ×10
SUT VIC AB 2-0 CT1 TAPERPNT 27 (SUTURE) IMPLANT
SUT VIC AB 2-0 CTX 27 (SUTURE) IMPLANT
SUT VIC AB 3-0 SH 27 (SUTURE)
SUT VIC AB 3-0 SH 27X BRD (SUTURE) IMPLANT
SUT VIC AB 3-0 X1 27 (SUTURE) ×2 IMPLANT
SUT VICRYL 4-0 PS2 18IN ABS (SUTURE) IMPLANT
SYSTEM SAHARA CHEST DRAIN ATS (WOUND CARE) ×6 IMPLANT
TAPE PAPER 2X10 WHT MICROPORE (GAUZE/BANDAGES/DRESSINGS) ×1 IMPLANT
TAPE PAPER 3X10 WHT MICROPORE (GAUZE/BANDAGES/DRESSINGS) ×1 IMPLANT
TOWEL GREEN STERILE (TOWEL DISPOSABLE) ×5 IMPLANT
TOWEL GREEN STERILE FF (TOWEL DISPOSABLE) ×5 IMPLANT
TRAY FOLEY SLVR 16FR TEMP STAT (SET/KITS/TRAYS/PACK) ×6 IMPLANT
TUBING LAP HI FLOW INSUFFLATIO (TUBING) ×6 IMPLANT
UNDERPAD 30X36 HEAVY ABSORB (UNDERPADS AND DIAPERS) ×5 IMPLANT
WATER STERILE IRR 1000ML POUR (IV SOLUTION) ×10 IMPLANT

## 2021-08-01 NOTE — Brief Op Note (Signed)
07/27/2021 - 08/01/2021  7:35 AM  PATIENT:  Danny [name]  88 y.o. male  PRE-OPERATIVE DIAGNOSIS:  CAD  POST-OPERATIVE DIAGNOSIS:  CAD  PROCEDURE:  Procedure(s): CORONARY ARTERY BYPASS GRAFTING (CABG) X 3  ,ON PUMP, USING LEFT AND RIGHT INTERNAL MAMMARY ARTERIES, RIGHT AND LEFT ENDOSCOPIC GREATER SAPHENOUS VEIN CONDUITS (N/A) TRANSESOPHAGEAL ECHOCARDIOGRAM (TEE) (N/A) APPLICATION OF CELL SAVER ENDOVEIN HARVEST OF GREATER SAPHENOUS VEIN (Right) LIMA-OM1 RIMA-LAD SVG-OM2  EVH 75/25  SURGEON:  Surgeon(s) and Role:    * Bartle, Fernande Boyden, MD - Primary  PHYSICIAN ASSISTANT: Oseas Detty PA-C  ASSISTANTS: STAFF  ANESTHESIA:   general  EBL:  500 mL   BLOOD ADMINISTERED:2 UNITS PRBC'S  DRAINS:  BILAT PLEURAL AND MEDIASTINAL CHEST TUBES    LOCAL MEDICATIONS USED:  NONE  SPECIMEN:  No Specimen  DISPOSITION OF SPECIMEN:  N/A  COUNTS:  YES  TOURNIQUET:  * No tourniquets in log *  DICTATION: .Dragon Dictation  PLAN OF CARE: Admit to inpatient   PATIENT DISPOSITION:  ICU - intubated and hemodynamically stable.   Delay start of Pharmacological VTE agent (>24hrs) due to surgical blood loss or risk of bleeding: yes  COMPLICATIONS: NO KNOWN

## 2021-08-01 NOTE — Anesthesia Procedure Notes (Signed)
Arterial Line Insertion Start/End1/23/2023 7:05 AM, 08/01/2021 7:15 AM Performed by: Gaylene Brooks, CRNA, CRNA  Patient location: Pre-op. Preanesthetic checklist: patient identified, IV checked, site marked, risks and benefits discussed, surgical consent, monitors and equipment checked, pre-op evaluation, timeout performed and anesthesia consent Lidocaine 1% used for infiltration and patient sedated Left, radial was placed Catheter size: 20 G Hand hygiene performed  and maximum sterile barriers used  Allen's test indicative of satisfactory collateral circulation Attempts: 1 Procedure performed without using ultrasound guided technique. Following insertion, Biopatch and dressing applied. Post procedure assessment: normal  Patient tolerated the procedure well with no immediate complications.

## 2021-08-01 NOTE — Progress Notes (Signed)
°  Echocardiogram Echocardiogram Transesophageal has been performed.  Johny Chess 08/01/2021, 9:16 AM

## 2021-08-01 NOTE — Anesthesia Procedure Notes (Addendum)
Central Venous Catheter Insertion Performed by: Belinda Block, MD, anesthesiologist Start/End1/23/2023 7:00 AM, 08/01/2021 7:20 AM Patient location: Pre-op. Preanesthetic checklist: patient identified, IV checked, site marked, risks and benefits discussed, surgical consent, monitors and equipment checked, pre-op evaluation, timeout performed and anesthesia consent Position: Trendelenburg Lidocaine 1% used for infiltration Hand hygiene performed  and maximum sterile barriers used  PA cath was placed.Swan type:thermodilution Procedure performed without using ultrasound guided technique. Attempts: 1 Patient tolerated the procedure well with no immediate complications.

## 2021-08-01 NOTE — Procedures (Signed)
Extubation Procedure Note  Patient Details:   Name: Danny [name] DOB: [birth year] MRN: [id number]   Airway Documentation:    Vent end date: (not recorded) Vent end time: (not recorded)   Evaluation  O2 sats: stable throughout Complications: No apparent complications Patient did tolerate procedure well. Bilateral Breath Sounds: Clear, Diminished   Yes Patient had a VC of 1.3 and a NIF of -32.  Extubated patient to 4L nasal canula. Patient oriented to time and place. Patient used IS times four with 250 mL. RN will continue to encourage.  Dimple Nanas 08/01/2021, 8:06 PM

## 2021-08-01 NOTE — Op Note (Signed)
CARDIOVASCULAR SURGERY OPERATIVE NOTE  08/01/2021  Surgeon:  Gaye Pollack, MD  First Assistant: Jadene Pierini,  PA-C:   An experienced assistant was required given the complexity of this surgery and the standard of surgical care. The assistant was needed for exposure, dissection, suctioning, retraction of delicate tissues and sutures, instrument exchange and for overall help during this procedure.   Preoperative Diagnosis:  Severe multi-vessel coronary artery disease   Postoperative Diagnosis:  Same   Procedure:  Median Sternotomy Extracorporeal circulation 3.   Coronary artery bypass grafting x 3  Right internal mammary artery graft to the LAD Left internal mammary artery graft to the OM1 SVG to OM2  4.   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    Anesthesia:  General Endotracheal   Clinical History/Surgical Indication:  This 88 year old gentleman was admitted with a non-ST segment elevation MI.  His initial troponin was negative but subsequent high-sensitivity troponin was [phone].  He never had any chest pain or shortness of breath but presented with a severe shaking episode that appeared to be of neurologic origin since he did not remember what happened.  MRI of the head showed no acute changes.  Cardiac catheterization shows severe three-vessel coronary disease with high-grade stenoses and every single-vessel.  Left ventricular systolic function appears normal.  2D echo is pending.  He does have a history of mild aortic stenosis by prior echo in 2020.  His mental status appears to be back to baseline.  The etiology of his symptoms at presentation are unclear but he certainly could have had an arrhythmic event with hypotension and seizure-like activity.  He denies any  prior history of chest pain, shortness of breath, or fatigue.  I agree that coronary bypass graft surgery is the best treatment for his coronary disease.  Despite being 88 years old he is in overall good condition and has been active up to this point.  He reported having some dark stools this morning and had a hemoglobin of 9.8 on admission.  His last hemoglobin in the computer records was 11.4 in January 2020.  This is probably another good reason not to have him on dual antiplatelet therapy. I discussed the operative procedure with the patient including alternatives, benefits and risks; including but not limited to bleeding, blood transfusion, infection, stroke, myocardial infarction, graft failure, heart block requiring a permanent pacemaker, organ dysfunction, and death.  Webber Michiels Fatzinger understands and agrees to proceed.  Preparation:  The patient was seen in the preoperative holding area and the correct patient, correct operation were confirmed with the patient after reviewing the medical record and catheterization. The consent was signed by me. Preoperative antibiotics were given. A pulmonary arterial line and radial arterial line were placed by the anesthesia team. The patient was taken back to the operating room and positioned supine on the operating room table. After being placed under general endotracheal anesthesia by the anesthesia team a foley catheter was placed. The neck, chest, abdomen, and both legs were prepped with betadine soap and solution and draped in the usual sterile manner. A surgical time-out was taken and the correct patient and operative procedure were confirmed with the nursing and anesthesia staff.   Cardiopulmonary Bypass:  A median sternotomy was performed. The pericardium was opened in the midline.There was fibrinous material in the pericardium with inflammation of the epicardium consistent with pericarditis. Right ventricular function  appeared moderately depressed.  The  ascending aorta was of normal size and had no palpable plaque. There were no contraindications to aortic cannulation or cross-clamping. The patient was fully systemically heparinized and the ACT was maintained > 400 sec. The proximal aortic arch was cannulated with a 48F aortic cannula for arterial inflow. Venous cannulation was performed via the right atrial appendage using a two-staged venous cannula. An antegrade cardioplegia/vent cannula was inserted into the mid-ascending aorta. Aortic occlusion was performed with a single cross-clamp. Systemic cooling to 32 degrees Centigrade and topical cooling of the heart with iced saline were used. Hyperkalemic antegrade cold blood cardioplegia was used to induce diastolic arrest and was then given at about 20 minute intervals throughout the period of arrest to maintain myocardial temperature at or below 10 degrees centigrade. A temperature probe was inserted into the interventricular septum and an insulating pad was placed in the pericardium.   Left internal mammary artery harvest:  The left side of the sternum was retracted using the Rultract retractor. The left internal mammary artery was harvested as a pedicle graft. All side branches were clipped. It was a large-sized vessel of good quality with excellent blood flow. It was ligated distally and divided. It was sprayed with topical papaverine solution to prevent vasospasm.  Right internal mammary artery harvest:  Since the vein in the right leg was not suitable to use and we had to harvest the left leg vein I decided to harvest the right IMA. The right side of the sternum was retracted using the Rultract retractor. The right internal mammary artery was harvested as a pedicle graft. All side branches were clipped. It was a large-sized vessel of good quality with excellent blood flow. It was ligated distally and divided. It was sprayed with topical papaverine solution to prevent vasospasm.   Endoscopic vein  harvest:  The right greater saphenous vein was harvested endoscopically through a 2 cm incision medial to the right knee. It was harvested from the upper thigh to below the knee. It was a small-sized vein of poor quality with thickened walls and was not distensible. It was scrapped.  Then the left greater saphenous vein was harvested endoscopically through a 2 cm incision medial to the left knee. It was harvested from the upper thigh to below the knee. It was small but the thigh portion was suitable for bypass conduit and enough to give one scissor length of vein. The side branches were ligated with 4-0 silk ties.    Coronary arteries:  The coronary arteries were examined.  LAD:  large vessel with no distal disease. The diagonal was small and diffusely diseased LCX:  OM1 was intramyocardial but visible proximally and was a medium caliber vessel. OM2 was also intramyocardial but visible proximally and was a medium caliber vessel. RCA:  diffusely diseased and occluded. Not graftable. Inferior wall was edematous and the RV was moderately dilated and hypokinetic on TEE consistent with recent RV infarct from RCA occlusion.   Grafts:  RIMA to the LAD: 2.5 mm. It was brought across the aorta and sewn end to side using 8-0 prolene continuous suture. LIMA to OM1:  1.75 mm. It was sewn end to side using 8-0 prolene continuous suture. SVG to OM2:  1.6 mm. It was sewn end to side using 7-0 prolene continuous suture.   The proximal vein graft anastomosis was performed to the mid-ascending aorta using continuous 6-0 prolene suture. A graft marker was placed around the proximal anastomosis.   Completion:  The patient was rewarmed to 37 degrees Centigrade. The clamp was removed from both IMA pedicles and there was rapid warming of the septum and return of sinus rhythm. The crossclamp was removed with a time of 79 minutes. There was spontaneous return of sinus rhythm. The distal and proximal anastomoses  were checked for hemostasis. The position of the grafts was satisfactory. Two temporary epicardial pacing wires were placed on the right atrium and two on the right ventricle. The patient was weaned from CPB without difficulty on milrinone and epi for RV dysfunction. CPB time was 109 minutes. Cardiac output was 4.5 LPM. TEE showed normal LV systolic function. There was moderate RV systolic dysfunction with a dilated RV which improved with time and inotropes. Heparin was fully reversed with protamine and the aortic and venous cannulas removed. Hemostasis was achieved. Mediastinal and bilateral pleural drainage tubes were placed. The sternum was closed with  #6 stainless steel wires. The fascia was closed with continuous # 1 vicryl suture. The subcutaneous tissue was closed with 2-0 vicryl continuous suture. The skin was closed with 3-0 vicryl subcuticular suture. All sponge, needle, and instrument counts were reported correct at the end of the case. Dry sterile dressings were placed over the incisions and around the chest tubes which were connected to pleurevac suction. The patient was then transported to the surgical intensive care unit in stable condition.

## 2021-08-01 NOTE — Anesthesia Procedure Notes (Signed)
Procedure Name: Intubation Date/Time: 08/01/2021 7:57 AM Performed by: Gaylene Brooks, CRNA Pre-anesthesia Checklist: Patient identified, Emergency Drugs available, Suction available and Patient being monitored Patient Re-evaluated:Patient Re-evaluated prior to induction Oxygen Delivery Method: Circle System Utilized Preoxygenation: Pre-oxygenation with 100% oxygen Induction Type: IV induction Ventilation: Mask ventilation without difficulty and Oral airway inserted - appropriate to patient size Laryngoscope Size: Sabra Heck and 2 Grade View: Grade I Tube type: Oral Number of attempts: 1 Airway Equipment and Method: Stylet and Oral airway Placement Confirmation: ETT inserted through vocal cords under direct vision, positive ETCO2 and breath sounds checked- equal and bilateral Secured at: 22 cm Tube secured with: Tape Dental Injury: Teeth and Oropharynx as per pre-operative assessment

## 2021-08-01 NOTE — Interval H&P Note (Signed)
History and Physical Interval Note:  08/01/2021 7:01 AM  Danny [name]  has presented today for surgery, with the diagnosis of CAD.  The various methods of treatment have been discussed with the patient and family. After consideration of risks, benefits and other options for treatment, the patient has consented to  Procedure(s): CORONARY ARTERY BYPASS GRAFTING (CABG) (N/A) TRANSESOPHAGEAL ECHOCARDIOGRAM (TEE) (N/A) as a surgical intervention.  The patient's history has been reviewed, patient examined, no change in status, stable for surgery.  I have reviewed the patient's chart and labs.  Questions were answered to the patient's satisfaction.     Gaye Pollack

## 2021-08-01 NOTE — Transfer of Care (Signed)
Immediate Anesthesia Transfer of Care Note  Patient: Danny [name]  Procedure(s) Performed: CORONARY ARTERY BYPASS GRAFTING (CABG) X 3  ,ON PUMP, USING LEFT AND RIGHT INTERNAL MAMMARY ARTERIES, RIGHT AND LEFT ENDOSCOPIC GREATER SAPHENOUS VEIN CONDUITS (Chest) TRANSESOPHAGEAL ECHOCARDIOGRAM (TEE) APPLICATION OF CELL SAVER ENDOVEIN HARVEST OF GREATER SAPHENOUS VEIN (Right)  Patient Location: SICU  Anesthesia Type:General  Level of Consciousness: sedated and Patient remains intubated per anesthesia plan  Airway & Oxygen Therapy: Patient remains intubated per anesthesia plan and Patient placed on Ventilator (see vital sign flow sheet for setting)  Post-op Assessment: Report given to RN and Post -op Vital signs reviewed and stable  Post vital signs: Reviewed and stable  Last Vitals:  Vitals Value Taken Time  BP 118/55 08/01/21 1431  Temp    Pulse 81 08/01/21 1437  Resp 16 08/01/21 1438  SpO2 97 % 08/01/21 1437  Vitals shown include unvalidated device data.  Last Pain:  Vitals:   08/01/21 0000  TempSrc:   PainSc: 0-No pain      Patients Stated Pain Goal: 0 (98/92/11 9417)  Complications: No notable events documented.

## 2021-08-01 NOTE — Progress Notes (Signed)
Patient ID: Danny [name], male   DOB: 06/05/1934, 88 y.o.   MRN: [id number]  TCTS Evening Rounds:   Hemodynamically stable, AV paced 80. CI = 2.3 on milrinone 0.25 and epi 1.5.  Has started to wake up on vent.   Urine output good  CT output low  CBC    Component Value Date/Time   WBC 10.8 (H) 08/01/2021 1458   RBC 3.38 (L) 08/01/2021 1458   HGB 10.7 (L) 08/01/2021 1458   HGB 11.4 (L) 07/23/2018 1005   HCT 32.1 (L) 08/01/2021 1458   HCT 32.1 (L) 07/23/2018 1005   PLT 125 (L) 08/01/2021 1458   PLT 184 07/23/2018 1005   MCV 95.0 08/01/2021 1458   MCV 96 07/23/2018 1005   MCH 31.7 08/01/2021 1458   MCHC 33.3 08/01/2021 1458   RDW 15.9 (H) 08/01/2021 1458   RDW 12.7 07/23/2018 1005   LYMPHSABS 1.7 07/27/2021 0025   LYMPHSABS 1.6 07/23/2018 1005   MONOABS 0.6 07/27/2021 0025   EOSABS 0.2 07/27/2021 0025   EOSABS 0.1 07/23/2018 1005   BASOSABS 0.0 07/27/2021 0025   BASOSABS 0.0 07/23/2018 1005     BMET    Component Value Date/Time   NA 135 08/01/2021 1322   NA 138 10/25/2020 1055   K 4.5 08/01/2021 1322   CL 102 08/01/2021 1322   CO2 20 (L) 08/01/2021 0113   GLUCOSE 150 (H) 08/01/2021 1322   BUN 29 (H) 08/01/2021 1322   BUN 23 10/25/2020 1055   CREATININE 1.00 08/01/2021 1322   CREATININE 1.22 (H) 12/20/2015 1008   CALCIUM 8.3 (L) 08/01/2021 0113   EGFR 61 10/25/2020 1055   GFRNONAA >60 08/01/2021 0113     A/P:  Stable postop course. Continue current plans. Wean vent when fully awake.

## 2021-08-01 NOTE — Anesthesia Postprocedure Evaluation (Signed)
Anesthesia Post Note  Patient: Danny [name]  Procedure(s) Performed: CORONARY ARTERY BYPASS GRAFTING (CABG) X 3  ,ON PUMP, USING LEFT AND RIGHT INTERNAL MAMMARY ARTERIES, RIGHT AND LEFT ENDOSCOPIC GREATER SAPHENOUS VEIN CONDUITS (Chest) TRANSESOPHAGEAL ECHOCARDIOGRAM (TEE) APPLICATION OF CELL SAVER ENDOVEIN HARVEST OF GREATER SAPHENOUS VEIN (Right)     Patient location during evaluation: SICU Anesthesia Type: General Level of consciousness: patient remains intubated per anesthesia plan Pain management: pain level controlled Vital Signs Assessment: post-procedure vital signs reviewed and stable Respiratory status: patient remains intubated per anesthesia plan Cardiovascular status: stable Postop Assessment: no apparent nausea or vomiting Anesthetic complications: no   No notable events documented.  Last Vitals:  Vitals:   08/01/21 0600 08/01/21 1431  BP: 123/65 (!) 118/55  Pulse: 64 87  Resp: (!) 9 15  Temp:    SpO2: 98% 97%    Last Pain:  Vitals:   08/01/21 0000  TempSrc:   PainSc: 0-No pain                 Shaia Porath

## 2021-08-01 NOTE — Anesthesia Procedure Notes (Signed)
Central Venous Catheter Insertion Performed by: Belinda Block, MD, anesthesiologist Start/End1/23/2023 7:00 AM, 08/01/2021 7:20 AM Patient location: Pre-op. Preanesthetic checklist: patient identified, IV checked, site marked, risks and benefits discussed, surgical consent, monitors and equipment checked, pre-op evaluation, timeout performed and anesthesia consent Lidocaine 1% used for infiltration and patient sedated Hand hygiene performed  and maximum sterile barriers used  Catheter size: 8.5 Fr Sheath introducer Procedure performed using ultrasound guided technique. Ultrasound Notes:anatomy identified, needle tip was noted to be adjacent to the nerve/plexus identified, no ultrasound evidence of intravascular and/or intraneural injection and image(s) printed for medical record Attempts: 1 Following insertion, line sutured, dressing applied and Biopatch. Post procedure assessment: blood return through all ports, free fluid flow and no air  Patient tolerated the procedure well with no immediate complications.

## 2021-08-01 NOTE — Progress Notes (Signed)
Time changed to 5 minute call given at 1419.

## 2021-08-02 ENCOUNTER — Encounter (HOSPITAL_COMMUNITY): Payer: Self-pay | Admitting: Surgery

## 2021-08-02 ENCOUNTER — Inpatient Hospital Stay (HOSPITAL_COMMUNITY): Payer: Medicare PPO

## 2021-08-02 LAB — CBC
HCT: 28.9 % — ABNORMAL LOW (ref 39.0–52.0)
HCT: 27.8 % — ABNORMAL LOW (ref 39.0–52.0)
Hemoglobin: 10.2 g/dL — ABNORMAL LOW (ref 13.0–17.0)
Hemoglobin: 9.5 g/dL — ABNORMAL LOW (ref 13.0–17.0)
MCH: 32.3 pg (ref 26.0–34.0)
MCH: 32.3 pg (ref 26.0–34.0)
MCHC: 35.3 g/dL (ref 30.0–36.0)
MCHC: 34.2 g/dL (ref 30.0–36.0)
MCV: 91.5 fL (ref 80.0–100.0)
MCV: 94.6 fL (ref 80.0–100.0)
Platelets: 152 10*3/uL (ref 150–400)
Platelets: 143 10*3/uL — ABNORMAL LOW (ref 150–400)
RBC: 3.16 MIL/uL — ABNORMAL LOW (ref 4.22–5.81)
RBC: 2.94 MIL/uL — ABNORMAL LOW (ref 4.22–5.81)
RDW: 16 % — ABNORMAL HIGH (ref 11.5–15.5)
RDW: 16.2 % — ABNORMAL HIGH (ref 11.5–15.5)
WBC: 10.3 10*3/uL (ref 4.0–10.5)
WBC: 9.6 10*3/uL (ref 4.0–10.5)
nRBC: 0.2 % (ref 0.0–0.2)
nRBC: 0.3 % — ABNORMAL HIGH (ref 0.0–0.2)

## 2021-08-02 LAB — BASIC METABOLIC PANEL
Anion gap: 15 (ref 5–15)
Anion gap: 10 (ref 5–15)
BUN: 31 mg/dL — ABNORMAL HIGH (ref 8–23)
BUN: 38 mg/dL — ABNORMAL HIGH (ref 8–23)
CO2: 19 mmol/L — ABNORMAL LOW (ref 22–32)
CO2: 23 mmol/L (ref 22–32)
Calcium: 7.7 mg/dL — ABNORMAL LOW (ref 8.9–10.3)
Calcium: 8.1 mg/dL — ABNORMAL LOW (ref 8.9–10.3)
Chloride: 104 mmol/L (ref 98–111)
Chloride: 106 mmol/L (ref 98–111)
Creatinine, Ser: 1.64 mg/dL — ABNORMAL HIGH (ref 0.61–1.24)
Creatinine, Ser: 1.62 mg/dL — ABNORMAL HIGH (ref 0.61–1.24)
GFR, Estimated: 41 mL/min — ABNORMAL LOW (ref >60)
GFR, Estimated: 41 mL/min — ABNORMAL LOW (ref >60)
Glucose, Bld: 158 mg/dL — ABNORMAL HIGH (ref 70–99)
Glucose, Bld: 95 mg/dL (ref 70–99)
Potassium: 4 mmol/L (ref 3.5–5.1)
Potassium: 4.5 mmol/L (ref 3.5–5.1)
Sodium: 138 mmol/L (ref 135–145)
Sodium: 139 mmol/L (ref 135–145)

## 2021-08-02 LAB — GLUCOSE, CAPILLARY
Glucose-Capillary: 183 mg/dL — ABNORMAL HIGH (ref 70–99)
Glucose-Capillary: 173 mg/dL — ABNORMAL HIGH (ref 70–99)
Glucose-Capillary: 160 mg/dL — ABNORMAL HIGH (ref 70–99)
Glucose-Capillary: 149 mg/dL — ABNORMAL HIGH (ref 70–99)
Glucose-Capillary: 160 mg/dL — ABNORMAL HIGH (ref 70–99)
Glucose-Capillary: 115 mg/dL — ABNORMAL HIGH (ref 70–99)
Glucose-Capillary: 95 mg/dL (ref 70–99)
Glucose-Capillary: 88 mg/dL (ref 70–99)
Glucose-Capillary: 109 mg/dL — ABNORMAL HIGH (ref 70–99)
Glucose-Capillary: 75 mg/dL (ref 70–99)

## 2021-08-02 LAB — MAGNESIUM
Magnesium: 2.8 mg/dL — ABNORMAL HIGH (ref 1.7–2.4)
Magnesium: 2.7 mg/dL — ABNORMAL HIGH (ref 1.7–2.4)

## 2021-08-02 MED ORDER — INSULIN ASPART 100 UNIT/ML IJ SOLN: 0.0000 [IU] | INTRAMUSCULAR | Status: DC

## 2021-08-02 MED ORDER — MILRINONE LACTATE IN DEXTROSE 20-5 MG/100ML-% IV SOLN: 0.1250 ug/kg/min | INTRAVENOUS | Status: DC

## 2021-08-02 MED ORDER — TRAMADOL HCL 50 MG PO TABS
50.0000 mg | ORAL_TABLET | ORAL | Status: DC | PRN
  Administered 2021-08-04 – 2021-08-10 (×2): 50 mg via ORAL
  Filled 2021-08-02 (×3): qty 1

## 2021-08-02 MED ORDER — INSULIN DETEMIR 100 UNIT/ML ~~LOC~~ SOLN
30.0000 [IU] | Freq: Every day | SUBCUTANEOUS | Status: DC
  Administered 2021-08-02: 09:00:00 30 [IU] via SUBCUTANEOUS
  Filled 2021-08-02 (×2): qty 0.3

## 2021-08-02 MED FILL — Thrombin (Recombinant) For Soln 20000 Unit: CUTANEOUS | Qty: 1 | Status: AC

## 2021-08-02 NOTE — Progress Notes (Signed)
Awake.Neuro intact. A paced. No episodes of AF.

## 2021-08-02 NOTE — TOC Progression Note (Signed)
Transition of Care [facility]) - Progression Note    Patient Details  Name: Danny [name] MRN: [id number] Date of Birth: 09/06/1933  [facility]) CM/SW Contact  Graves-Bigelow, Ocie Cornfield, RN Phone Number: 08/02/2021, 4:07 PM  Clinical Narrative: POD-1 CABG, patient is on Milrinone. Prior to arrival patient was from home with spouse. Case Manager will continue to follow for disposition needs.   Expected Discharge Plan: [place] Barriers to Discharge: Continued Medical Work up  Expected Discharge Plan and Services Expected Discharge Plan: [place] In-house Referral: NA Discharge Planning Services: CM Consult Post Acute Care Choice: [place] arrangements for the past 2 months: Single Family Home                   DME Agency: NA       HH Arranged: PT HH Agency: [place] Date [place]: 07/29/21 Time HH Agency Contacted: 1026 Representative spoke with at [place]: Tommi Rumps  Readmission Risk Interventions No flowsheet data found.

## 2021-08-02 NOTE — Progress Notes (Signed)
EVENING ROUNDS NOTE :     [address]       [place] [address]             [phone]                 1 Day Post-Op Procedure(s) (LRB): CORONARY ARTERY BYPASS GRAFTING (CABG) X 3  ,ON PUMP, USING LEFT AND RIGHT INTERNAL MAMMARY ARTERIES, RIGHT AND LEFT ENDOSCOPIC GREATER SAPHENOUS VEIN CONDUITS (N/A) TRANSESOPHAGEAL ECHOCARDIOGRAM (TEE) (N/A) APPLICATION OF CELL SAVER ENDOVEIN HARVEST OF GREATER SAPHENOUS VEIN (Right)   Total Length of Stay:  LOS: 6 days  Events:   No events Visiting with family Low dose neo    BP 94/60    Pulse 88    Temp 98.2 F (36.8 C)    Resp 17    Ht 5\' 4"  (1.626 m)    Wt 73.5 kg    SpO2 92%    BMI 27.81 kg/m   PAP: (15-34)/(6-20) 33/18 CO:  [3.7 L/min-5.8 L/min] 4.4 L/min CI:  [2.1 L/min/m2-3.3 L/min/m2] 2.5 L/min/m2  Vent Mode: CPAP;PSV FiO2 (%):  [40 %] 40 % Set Rate:  [4 bmp] 4 bmp Vt Set:  [470 mL] 470 mL PEEP:  [5 cmH20] 5 cmH20 Pressure Support:  [10 cmH20] 10 cmH20   sodium chloride Stopped (08/01/21 2025)   sodium chloride Stopped (08/02/21 0817)   sodium chloride 20 mL/hr at 08/01/21 1606    ceFAZolin (ANCEF) IV Stopped (08/02/21 1435)   epinephrine 1 mcg/min (08/02/21 1700)   lactated ringers Stopped (08/01/21 1549)   lactated ringers 20 mL/hr at 08/02/21 1700   milrinone 0.125 mcg/kg/min (08/02/21 1156)   nitroGLYCERIN     phenylephrine (NEO-SYNEPHRINE) Adult infusion 30 mcg/min (08/02/21 1700)    I/O last 3 completed shifts: In: 6970.5 [P.O.:220; I.V.:5048.7; Blood:555; IV Piggyback:1146.8] Out: 2673 [Urine:1696; Blood:500; Chest Tube:477]   CBC Latest Ref Rng & Units 08/02/2021 08/01/2021 08/01/2021  WBC 4.0 - 10.5 K/uL 10.3 - 9.3  Hemoglobin 13.0 - 17.0 g/dL 10.2(L) 9.9(L) 10.4(L)  Hematocrit 39.0 - 52.0 % 28.9(L) 29.0(L) 30.9(L)  Platelets 150 - 400 K/uL 152 - 146(L)    BMP Latest Ref Rng & Units 08/02/2021 08/01/2021 08/01/2021  Glucose 70 - 99 mg/dL 158(H) - 138(H)  BUN 8 - 23 mg/dL 31(H) - 30(H)   Creatinine 0.61 - 1.24 mg/dL 1.64(H) - 1.36(H)  BUN/Creat Ratio 10 - 24 - - -  Sodium 135 - 145 mmol/L 138 137 135  Potassium 3.5 - 5.1 mmol/L 4.0 4.1 4.1  Chloride 98 - 111 mmol/L 104 - 104  CO2 22 - 32 mmol/L 19(L) - 21(L)  Calcium 8.9 - 10.3 mg/dL 7.7(L) - 7.5(L)    ABG    Component Value Date/Time   PHART 7.332 (L) 08/01/2021 2054   PCO2ART 43.5 08/01/2021 2054   PO2ART 87 08/01/2021 2054   HCO3 23.2 08/01/2021 2054   TCO2 24 08/01/2021 2054   ACIDBASEDEF 3.0 (H) 08/01/2021 2054   O2SAT 96.0 08/01/2021 2054       Melodie Bouillon, MD 08/02/2021 5:49 PM

## 2021-08-02 NOTE — Progress Notes (Addendum)
0700 - Report received from PM RN. All questions answered.  Safety checks performed.  All lines and drips verified. Hand hygiene performed before/after each pt contact. 4356 -Dr. Cyndia Bent rounded. [place], TCTS PA rounded. 0800 - Assessment & Rx. 0850 - Dr. Tamala Julian rounded. 1000 - Dr. Cyndia Bent rounded.  Advised to change milrinone dose to 0.125 mcg/kg/min. 1400 - Pt bathed with soap and CHG.  Linens and gown changed.  Socks changed. 1500 - Dr. Cyndia Bent rounded.  Advised to D/C Swan-Ganz Catheter.  Swan-Ganz catheter D/C'd. 1600 - Pt's wife and daughter arrived to visit. 68 - Pt's pastor arrived to visit. 61 - Dr. Kipp Brood rounded on pt. 1815 - Pt's pastor left. 18 - Pt's wife and daughter left. 1900 - Report given to PM RN.  All questions answered.

## 2021-08-02 NOTE — Progress Notes (Addendum)
[facility] DAILY ICU PROGRESS NOTE                   [name]            [place] [address]          [phone]   1 Day Post-Op Procedure(s) (LRB): CORONARY ARTERY BYPASS GRAFTING (CABG) X 3  ,ON PUMP, USING LEFT AND RIGHT INTERNAL MAMMARY ARTERIES, RIGHT AND LEFT ENDOSCOPIC GREATER SAPHENOUS VEIN CONDUITS (N/A) TRANSESOPHAGEAL ECHOCARDIOGRAM (TEE) (N/A) APPLICATION OF CELL SAVER ENDOVEIN HARVEST OF GREATER SAPHENOUS VEIN (Right)  Total Length of Stay:  LOS: 6 days   Subjective: Some pain - currently controlled  Objective: Vital signs in last 24 hours: Temp:  [95.2 F (35.1 C)-99 F (37.2 C)] 99 F (37.2 C) (01/24 0617) Pulse Rate:  [80-99] 97 (01/24 0617) Cardiac Rhythm: Normal sinus rhythm;Atrial paced (01/24 0400) Resp:  [12-24] 16 (01/24 0617) BP: (94-131)/(46-108) 110/53 (01/24 0600) SpO2:  [90 %-98 %] 95 % (01/24 0617) Arterial Line BP: (89-151)/(37-64) 115/41 (01/24 0617) FiO2 (%):  [40 %-50 %] 40 % (01/23 1921) Weight:  [73.5 kg] 73.5 kg (01/24 0500)  Filed Weights   07/31/21 0500 08/01/21 0600 08/02/21 0500  Weight: 70.8 kg 70.7 kg 73.5 kg    Weight change: 2.8 kg   Hemodynamic parameters for last 24 hours: PAP: (17-33)/(6-20) 24/8 CO:  [3.7 L/min-5.8 L/min] 5.8 L/min CI:  [2.1 L/min/m2-3.3 L/min/m2] 3.3 L/min/m2  Intake/Output from previous day: 01/23 0701 - 01/24 0700 In: 6253.5 [I.V.:4651.7; Blood:555; IV Piggyback:1046.8] Out: 1540 [Urine:746; Blood:500; Chest Tube:477]  Intake/Output this shift: No intake/output data recorded.  Current Meds: Scheduled Meds:  acetaminophen  1,000 mg Oral Q6H   Or   acetaminophen (TYLENOL) oral liquid 160 mg/5 mL  1,000 mg Per Tube Q6H   aspirin EC  325 mg Oral Daily   Or   aspirin  324 mg Per Tube Daily   bisacodyl  10 mg Oral Daily   Or   bisacodyl  10 mg Rectal Daily   Chlorhexidine Gluconate Cloth  6 each Topical Daily   docusate sodium  200 mg Oral Daily   insulin aspart  0-15 Units  Subcutaneous Q4H   insulin detemir  30 Units Subcutaneous Daily   mouth rinse  15 mL Mouth Rinse BID   [START ON 08/03/2021] pantoprazole  40 mg Oral Daily   rosuvastatin  20 mg Oral Daily   sodium chloride flush  10-40 mL Intracatheter Q12H   sodium chloride flush  3 mL Intravenous Q12H   Continuous Infusions:  sodium chloride Stopped (08/01/21 2025)   sodium chloride     sodium chloride 20 mL/hr at 08/01/21 1606    ceFAZolin (ANCEF) IV 2 g (08/02/21 0534)   epinephrine 10 mcg/min (08/02/21 0530)   famotidine (PEPCID) IV Stopped (08/01/21 1606)   lactated ringers Stopped (08/01/21 1549)   lactated ringers 20 mL/hr at 08/02/21 0530   milrinone 0.25 mcg/kg/min (08/02/21 0400)   nitroGLYCERIN     phenylephrine (NEO-SYNEPHRINE) Adult infusion 30 mcg/min (08/01/21 2000)   PRN Meds:.sodium chloride, morphine injection, ondansetron (ZOFRAN) IV, oxyCODONE, sodium chloride flush, sodium chloride flush, traMADol  General appearance: alert, cooperative, and no distress Neurologic: intact Heart: regular rate and rhythm Lungs: clear anteriorly Abdomen: soft, non tender Extremities: no edema Wound: dressings CDI  Lab Results: CBC: Recent Labs    08/01/21 1946 08/01/21 2054 08/02/21 0504  WBC 9.3  --  10.3  HGB 10.4* 9.9* 10.2*  HCT  30.9* 29.0* 28.9*  PLT 146*  --  152   BMET:  Recent Labs    08/01/21 1946 08/01/21 2054 08/02/21 0504  NA 135 137 138  K 4.1 4.1 4.0  CL 104  --  104  CO2 21*  --  19*  GLUCOSE 138*  --  158*  BUN 30*  --  31*  CREATININE 1.36*  --  1.64*  CALCIUM 7.5*  --  7.7*    CMET: Lab Results  Component Value Date   WBC 10.3 08/02/2021   HGB 10.2 (L) 08/02/2021   HCT 28.9 (L) 08/02/2021   PLT 152 08/02/2021   GLUCOSE 158 (H) 08/02/2021   CHOL 162 07/28/2021   TRIG 54 07/28/2021   HDL 57 07/28/2021   LDLCALC 94 07/28/2021   ALT 17 07/27/2021   AST 22 07/27/2021   NA 138 08/02/2021   K 4.0 08/02/2021   CL 104 08/02/2021   CREATININE 1.64  (H) 08/02/2021   BUN 31 (H) 08/02/2021   CO2 19 (L) 08/02/2021   TSH 1.029 07/27/2021   INR 1.5 (H) 08/01/2021   HGBA1C 5.5 07/27/2021      PT/INR:  Recent Labs    08/01/21 1458  LABPROT 18.2*  INR 1.5*   Radiology: [facility] Chest Port 1 View  Result Date: 08/01/2021 CLINICAL DATA:  Postop CABG. EXAM: PORTABLE CHEST 1 VIEW COMPARISON:  Radiographs 07/27/2021 and 10/06/2005. FINDINGS: 1502 hours. Tip of the endotracheal tube is in the mid trachea. Right IJ Swan-Ganz catheter projects to the right ventricular outflow tract or main pulmonary artery. Enteric tube projects below the diaphragm, tip not visualized. Mediastinal drain and bilateral chest tubes are in place. The heart size and mediastinal contours are stable. There is a minimal left apical pneumothorax. Mild atelectasis is present at both lung bases. No edema or significant pleural effusion. The bones appear unremarkable. IMPRESSION: Minimal left apical pneumothorax following CABG. Support system positioned as above. Electronically Signed   By: Richardean Sale M.D.   On: 08/01/2021 15:31     Assessment/Plan: S/P Procedure(s) (LRB): CORONARY ARTERY BYPASS GRAFTING (CABG) X 3  ,ON PUMP, USING LEFT AND RIGHT INTERNAL MAMMARY ARTERIES, RIGHT AND LEFT ENDOSCOPIC GREATER SAPHENOUS VEIN CONDUITS (N/A) TRANSESOPHAGEAL ECHOCARDIOGRAM (TEE) (N/A) APPLICATION OF CELL SAVER ENDOVEIN HARVEST OF GREATER SAPHENOUS VEIN (Right) POD#1  1 afeb, hemodyn stable on milrinone, epi and neo- wean epi as able 2 extubated, sats good on 4 liters 3 fair UOP, weight about 5 kg over preop, will need diuresis , creat with acute rise, 1.64 4 expected ABLA , monitor clinically 5 BS adeq controlled - transition off insulin gtt 6 CXR - basilar atx- routine pul hygiene 7 CT -477 cc , keep for now       Danny Giovanni PA-C Pager [phone] 08/02/2021 7:35 AM     Chart reviewed, patient examined, agree with above. He looks good overall. Hemodynamics  stable. Wean inotropes as tolerated. Creat bumped to 1.64 probably related to periop hypotension. Will hold off on diuresis today. Maintain adequate MAP.

## 2021-08-03 ENCOUNTER — Inpatient Hospital Stay (HOSPITAL_COMMUNITY): Payer: Medicare PPO

## 2021-08-03 LAB — COOXEMETRY PANEL
Carboxyhemoglobin: 1.2 % (ref 0.5–1.5)
Methemoglobin: 1 % (ref 0.0–1.5)
O2 Saturation: 53.8 %
Total hemoglobin: 9.2 g/dL — ABNORMAL LOW (ref 12.0–16.0)

## 2021-08-03 LAB — GLUCOSE, CAPILLARY
Glucose-Capillary: 100 mg/dL — ABNORMAL HIGH (ref 70–99)
Glucose-Capillary: 114 mg/dL — ABNORMAL HIGH (ref 70–99)
Glucose-Capillary: 136 mg/dL — ABNORMAL HIGH (ref 70–99)
Glucose-Capillary: 64 mg/dL — ABNORMAL LOW (ref 70–99)
Glucose-Capillary: 66 mg/dL — ABNORMAL LOW (ref 70–99)
Glucose-Capillary: 70 mg/dL (ref 70–99)
Glucose-Capillary: 78 mg/dL (ref 70–99)
Glucose-Capillary: 84 mg/dL (ref 70–99)
Glucose-Capillary: 95 mg/dL (ref 70–99)
Glucose-Capillary: 98 mg/dL (ref 70–99)

## 2021-08-03 LAB — BASIC METABOLIC PANEL
Anion gap: 8 (ref 5–15)
BUN: 41 mg/dL — ABNORMAL HIGH (ref 8–23)
CO2: 24 mmol/L (ref 22–32)
Calcium: 8.2 mg/dL — ABNORMAL LOW (ref 8.9–10.3)
Chloride: 103 mmol/L (ref 98–111)
Creatinine, Ser: 1.66 mg/dL — ABNORMAL HIGH (ref 0.61–1.24)
GFR, Estimated: 40 mL/min — ABNORMAL LOW (ref >60)
Glucose, Bld: 87 mg/dL (ref 70–99)
Potassium: 4.4 mmol/L (ref 3.5–5.1)
Sodium: 135 mmol/L (ref 135–145)

## 2021-08-03 LAB — CBC
HCT: 28.6 % — ABNORMAL LOW (ref 39.0–52.0)
Hemoglobin: 9.5 g/dL — ABNORMAL LOW (ref 13.0–17.0)
MCH: 32.1 pg (ref 26.0–34.0)
MCHC: 33.2 g/dL (ref 30.0–36.0)
MCV: 96.6 fL (ref 80.0–100.0)
Platelets: 134 10*3/uL — ABNORMAL LOW (ref 150–400)
RBC: 2.96 MIL/uL — ABNORMAL LOW (ref 4.22–5.81)
RDW: 16.2 % — ABNORMAL HIGH (ref 11.5–15.5)
WBC: 9.4 10*3/uL (ref 4.0–10.5)
nRBC: 0 % (ref 0.0–0.2)

## 2021-08-03 MED ORDER — DEXTROSE 50 % IV SOLN: 12.5000 g | INTRAVENOUS | Status: AC

## 2021-08-03 MED ORDER — DEXTROSE 50 % IV SOLN
INTRAVENOUS | Status: AC
  Administered 2021-08-03: 01:00:00 12.5 g via INTRAVENOUS
  Filled 2021-08-03: qty 50

## 2021-08-03 MED ORDER — ENOXAPARIN SODIUM 40 MG/0.4ML IJ SOSY
40.0000 mg | PREFILLED_SYRINGE | Freq: Every day | INTRAMUSCULAR | Status: DC
  Administered 2021-08-03 – 2021-08-06 (×4): 40 mg via SUBCUTANEOUS
  Filled 2021-08-03 (×4): qty 0.4

## 2021-08-03 MED ORDER — ALBUMIN HUMAN 5 % IV SOLN: 12.5000 g | Freq: Once | INTRAVENOUS | Status: AC

## 2021-08-03 MED ORDER — DOPAMINE-DEXTROSE 3.2-5 MG/ML-% IV SOLN
2.0000 ug/kg/min | INTRAVENOUS | Status: DC
  Administered 2021-08-03: 3 ug/kg/min via INTRAVENOUS
  Filled 2021-08-03: qty 250

## 2021-08-03 MED ORDER — ALBUMIN HUMAN 5 % IV SOLN
INTRAVENOUS | Status: AC
  Administered 2021-08-03: 20:00:00 12.5 g via INTRAVENOUS
  Filled 2021-08-03: qty 250

## 2021-08-03 MED FILL — Heparin Sodium (Porcine) Inj 1000 Unit/ML: INTRAMUSCULAR | Qty: 10 | Status: AC

## 2021-08-03 MED FILL — Sodium Bicarbonate IV Soln 8.4%: INTRAVENOUS | Qty: 100 | Status: AC

## 2021-08-03 MED FILL — Magnesium Sulfate Inj 50%: INTRAMUSCULAR | Qty: 10 | Status: AC

## 2021-08-03 MED FILL — Mannitol IV Soln 20%: INTRAVENOUS | Qty: 500 | Status: AC

## 2021-08-03 MED FILL — Electrolyte-R (PH 7.4) Solution: INTRAVENOUS | Qty: 3000 | Status: AC

## 2021-08-03 MED FILL — Heparin Sodium (Porcine) Inj 1000 Unit/ML: Qty: 1000 | Status: AC

## 2021-08-03 MED FILL — Potassium Chloride Inj 2 mEq/ML: INTRAVENOUS | Qty: 40 | Status: AC

## 2021-08-03 MED FILL — Lidocaine HCl (Cardiac) IV PF Soln 100 MG/5ML (2%): INTRAVENOUS | Qty: 5 | Status: AC

## 2021-08-03 MED FILL — Sodium Chloride IV Soln 0.9%: INTRAVENOUS | Qty: 2000 | Status: AC

## 2021-08-03 NOTE — Progress Notes (Addendum)
Pt struggling to intake oral juice for treatment of hypoglycemia. RN gave 12.5 g D50 per protocol.

## 2021-08-03 NOTE — Discharge Summary (Signed)
[address]       [place] [address]             [phone]    Physician Discharge Summary  Patient ID: Danny [name] MRN: [id number] DOB/AGE: 01/05/1934 88 y.o.   Referring: No ref. 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   Aortic stenosis 07/30/2021   B12 deficiency 07/29/2021   Thrombocytopenia ([place]) 07/28/2021   Macrocytic anemia 07/28/2021   Seizure-like activity ([place]) 07/27/2021   STEMI (ST elevation myocardial infarction) ([place]) 07/27/2021   NSTEMI (non-ST elevated myocardial infarction) ([place]) 07/27/2021   Generalized abdominal pain 09/30/2020   Loss of weight 09/30/2020   CKD (chronic kidney disease) stage 3, GFR 30-59 ml/min (HCC) 11/21/2019   Transient alteration of awareness 07/25/2018   Syncope 07/25/2018   Hyperlipidemia 05/28/2015   Medicare annual wellness visit, subsequent 04/20/2015   Erectile dysfunction 04/20/2015   Essential hypertension 04/20/2015     Discharge Diagnoses:  Patient Active Problem List   Diagnosis Date Noted   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/2021   Aortic stenosis 07/30/2021   B12 deficiency 07/29/2021   Thrombocytopenia ([place]) 07/28/2021   Macrocytic anemia 07/28/2021   Seizure-like activity ([place]) 07/27/2021   STEMI (ST elevation myocardial infarction) ([place]) 07/27/2021   NSTEMI (non-ST elevated myocardial infarction) ([place]) 07/27/2021   Generalized abdominal pain 09/30/2020   Loss of weight 09/30/2020   CKD (chronic kidney disease) stage 3, GFR 30-59 ml/min (HCC) 11/21/2019   Transient alteration of awareness 07/25/2018   Syncope 07/25/2018   Hyperlipidemia 05/28/2015   Medicare annual wellness visit, subsequent 04/20/2015   Erectile dysfunction 04/20/2015   Essential hypertension 04/20/2015      Discharged Condition: good  Referring: No ref. provider found Primary Care: Hoyt Koch, MD Primary Cardiologist:Philip Nahser, MD   Chief Complaint:       Chief Complaint  Patient presents with   Code STEMI      History of Present Illness: At time of consultation   The patient is an 88 year old male who presented to the ER today as a code STEMI.  He has a history of hypertension, hyperlipidemia but no previous history of CAD.  He had a previous negative nuclear scan for ischemia and echocardiogram in March 2020 with normal LV function and mild aortic stenosis.  He has been doing well up until today when EMS was called as he became unresponsive with possible seizure-like activity.  The shaking lasted less than a minute but the unresponsiveness lasted longer.  Upon arrival of EMS he was confused and combative.  He was given Versed which calmed him.  He was initially noted also to be hypotensive and bradycardic.  ST segment elevation was noted on EKG and a code STEMI was called.  He had no chest pain and initial troponin was negative so it was canceled.  Increased high-sensitivity's troponin's continued and peak thus far has been 5799.  EKG in the ER did show inferior lead ischemic changes.  He has subsequently been taken to the Cath Lab where he was found to have severe three-vessel coronary artery disease with the culprit lesion probably RCA.  His EF is noted to be 40 to 45% with normal left ventricular end-diastolic pressures.  There was evidence of basal to mid inferior wall  hypokinesis.  The patient does state that he has been under a lot of stress recently.  He has no previous history of seizures or CVA.    A CT scan of the head showed no hemorrhage.  An MRI of the brain showed no acute intracranial abnormality.  There was a chronic lacunar infarct in the left thalamus and advanced cerebral white matter signal changes which are nonspecific but most likely also are small vessel  disease related.  A CT angio of the head was negative for large vessel occlusion.  Please see full reports below.  Cardiology feels best revascularization option is surgical revascularization and we are asked to see the patient for consideration of CABG.  If deemed too high risk PCI is an option.  His mental status and neurological exam has normalized upon medical stabilization.  He does not recall the events leading up to the emergency room visit.  He has not had any recent illnesses.  He has not had any recent COVID or influenza infections.He did note he had some dark stool this am and has been found to be moderately anemic.  He is fairly active in general and says he walks a lot as well as lifts weights.  He also takes care of his wife who is ill and has significant physical impairments.  The patient and all relevant studies were reviewed by Dr. Cyndia Bent who agreed with recommendations to proceed with coronary artery surgical revascularization.  Hospital course: Following ongoing medical stabilization the patient was felt able to proceed, and on 08/01/2021 he was taken to the operating room and underwent coronary artery bypass grafting x3.  Surgery was performed by Dr. Cyndia Bent, tolerated well, and the patient was taken to the surgical intensive care unit in stable condition.   Postoperative hospital course:  The patient was extubated using standard post cardiac surgical protocols without difficulty on the evening of surgery.  He has remained neurologically intact.  He is remained hemodynamically stable but initially does require some pressor support which was weaned over time.  He does have an expected acute blood loss anemia which is being monitored clinically.  Is not in the transfusion threshold.  Blood sugars have been under good control using standard measures. It is noted he did have some acute renal insufficiency felt most likely to be acute kidney injury due to perioperative hypotension.  He was started  on renal dose dopamine for this for a short time.  He had significant volume overload and peripheral edema but did respond well to diuretics over time with stabilization of his creatinine.   He was evaluated by PT/OT who recommended CIR at time of discharge.  However the patient declined this and requested home health services.  The patient was maintaining NSR with a moderate amount of ventricular ectopy His pacing wires were removed without difficulty.  He developed some left arm swelling and edema and venous duplex was obtained.  It was negative for DVT.  He did have some clot in a superficial cephalic vein.  He was placed on Keflex prophylactically and showed overall good improvement.  His incisions are all healing well without evidence of infection.  He was weaned off oxygen and maintaining good saturations.  He was tolerating diet.  He was tolerating routine cardiac rehab.  At the time of discharge he was felt to be quite stable.  Home health care arrangements have been made.   Consults: cardiology  Significant Diagnostic Studies:  CT ANGIO HEAD NECK W WO CM  Result Date: 07/27/2021 CLINICAL DATA:  Initial evaluation for acute altered mental status. EXAM: CT ANGIOGRAPHY HEAD AND NECK TECHNIQUE: Multidetector CT imaging of the head and neck was performed using the standard protocol during bolus administration of intravenous contrast. Multiplanar CT image reconstructions and MIPs were obtained to evaluate the vascular anatomy. Carotid stenosis measurements (when applicable) are obtained utilizing NASCET criteria, using the distal internal carotid diameter as the denominator. RADIATION DOSE REDUCTION: This exam was performed according to the departmental dose-optimization program which includes automated exposure control, adjustment of the mA and/or kV according to patient size and/or use of iterative reconstruction technique. CONTRAST:  72mL OMNIPAQUE IOHEXOL 350 MG/ML SOLN COMPARISON:  Head CT from  earlier the same day. FINDINGS: CTA NECK FINDINGS Aortic arch: Visualized aortic arch normal caliber with normal branch pattern. Moderate atheromatous change about the arch itself. No hemodynamically significant stenosis about the origin the great vessels. Right carotid system: Right CCA patent from its origin to the bifurcation without stenosis. Scattered mixed plaque about the right carotid bulb/proximal right ICA without hemodynamically significant stenosis. Right ICA patent distally without stenosis or dissection. Left carotid system: Left CCA patent from its origin to the bifurcation without stenosis. Bulky calcified plaque about the left carotid bulb/proximal left ICA without hemodynamically significant stenosis. Left ICA patent distally without stenosis or dissection. Vertebral arteries: Both vertebral arteries arise from the subclavian arteries. Atheromatous change at the origin of the right vertebral artery with no more than mild stenosis. Vertebral arteries otherwise patent distally without stenosis or dissection. Skeleton: No discrete or worrisome osseous lesions. Mild-to-moderate spondylosis at C3-4 through C6-7. Patient is edentulous. Other neck: No other acute soft tissue abnormality within the neck. Upper chest: Visualized upper chest demonstrates no acute finding. Review of the MIP images confirms the above findings CTA HEAD FINDINGS Anterior circulation: Petrous segments patent bilaterally. Scattered atheromatous change within the carotid siphons without hemodynamically significant stenosis. A1 segments patent bilaterally. Normal anterior communicating artery complex. Anterior cerebral arteries patent without stenosis. No M1 stenosis or occlusion. Normal MCA bifurcations. Distal MCA branches well perfused and symmetric. Posterior circulation: Both V4 segments patent without stenosis. Both PICA origins patent and normal. Basilar patent to its distal aspect without stenosis. Superior cerebellar  arteries patent bilaterally. Both PCAs primarily supplied via the basilar. Atheromatous change throughout the PCAs bilaterally with associated moderate left P2 stenosis (series 12, image 25). PCAs remain patent to their distal aspects. Venous sinuses: Grossly patent allowing for timing the contrast bolus. Anatomic variants: None significant.  No aneurysm. Review of the MIP images confirms the above findings IMPRESSION: 1. Negative CTA for large vessel occlusion. 2. Moderate atheromatous change about the carotid bifurcations and carotid siphons without hemodynamically significant stenosis. 3. Moderate left P2 stenosis. Electronically Signed   By: Jeannine Boga M.D.   On: 07/27/2021 02:36   CT Head Wo Contrast  Result Date: 07/27/2021 CLINICAL DATA:  Mental status change, unknown cause EXAM: CT HEAD WITHOUT CONTRAST TECHNIQUE: Contiguous axial images were obtained from the base of the skull through the vertex without intravenous contrast. RADIATION DOSE REDUCTION: This exam was performed according to the departmental dose-optimization program which includes automated exposure control, adjustment of the mA and/or kV according to patient size and/or use of iterative reconstruction technique. COMPARISON:  None. BRAIN: BRAIN Cerebral ventricle sizes are concordant with the degree of cerebral volume loss. Patchy and confluent areas of decreased attenuation are noted throughout the deep and periventricular white matter of the cerebral hemispheres bilaterally, compatible with chronic  microvascular ischemic disease. No evidence of large-territorial acute infarction. No parenchymal hemorrhage. No mass lesion. No extra-axial collection. No mass effect or midline shift. No hydrocephalus. Basilar cisterns are patent. Vascular: No hyperdense vessel. Atherosclerotic calcifications are present within the cavernous internal carotid and vertebral arteries. Skull: No acute fracture or focal lesion. Sinuses/Orbits: Paranasal  sinuses and mastoid air cells are clear. Bilateral lens replacement. Otherwise the orbits are unremarkable. Other: None. IMPRESSION: No acute intracranial abnormality in a patient with diffuse atrophy and chronic microvascular ischemic changes. Electronically Signed   By: Iven Finn M.D.   On: 07/27/2021 01:40   MR BRAIN WO CONTRAST  Result Date: 07/27/2021 CLINICAL DATA:  88 year old male with unexplained altered mental status. EXAM: MRI HEAD WITHOUT CONTRAST TECHNIQUE: Multiplanar, multiecho pulse sequences of the brain and surrounding structures were obtained without intravenous contrast. COMPARISON:  CT head, CTA head and neck earlier today. FINDINGS: Brain: No restricted diffusion to suggest acute infarction. No midline shift, mass effect, evidence of mass lesion, ventriculomegaly, extra-axial collection or acute intracranial hemorrhage. Cervicomedullary junction and pituitary are within normal limits. Patchy and confluent widespread bilateral cerebral white matter T2 and FLAIR hyperintensity. No cortical encephalomalacia. No chronic cerebral blood products identified. Chronic lacunar infarct lateral left thalamus. But the other deep gray matter nuclei are within normal limits for age. Brainstem and cerebellum within normal limits for age. Vascular: Major intracranial vascular flow voids are preserved. Skull and upper cervical spine: Negative. Sinuses/Orbits: Postoperative changes to both globes. Otherwise negative orbits. Trace paranasal sinus mucosal thickening. Other: Mastoids are clear. Visible internal auditory structures appear normal. Scalp and face appear negative. IMPRESSION: 1. No acute intracranial abnormality. 2. 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 Electronically Signed   By: Genevie Ann M.D.   On: 07/27/2021 05:29   CARDIAC CATHETERIZATION  Addendum Date:  07/27/2021     Prox RCA lesion is 100% stenosed.   1st Mrg lesion is 90% stenosed.   Mid Cx to Dist Cx lesion is 90% stenosed.   1st Diag lesion is 95% stenosed.   2nd Diag-1 lesion is 80% stenosed.   2nd Diag-2 lesion is 85% stenosed.   Mid LAD lesion is 85% stenosed.   There is mild left ventricular systolic dysfunction.   LV end diastolic pressure is normal. 1.  Moderately calcified coronary arteries with severe three-vessel coronary artery disease.  The culprit for myocardial infarction is likely occluded proximal right coronary artery.  The onset of occlusion occlusion is likely more than 12 hours with no chest pain. 2.  Mildly reduced LV systolic function with an EF of 40 to 45% with normal left ventricular end-diastolic pressure.  There is evidence of basal to mid inferior wall hypokinesis. Recommendations: Given diffuse three-vessel coronary artery disease, I think the best option for revascularization is CABG. Resume heparin in 4 hours at 7 PM. Multivessel PCI to the LAD, OM1 and left circumflex is also possible if the patient is deemed to be too high risk for CABG.  Result Date: 07/27/2021   Prox RCA lesion is 100% stenosed.   1st Mrg lesion is 90% stenosed.   Mid Cx to Dist Cx lesion is 90% stenosed.   1st Diag lesion is 95% stenosed.   2nd Diag-1 lesion is 80% stenosed.   2nd Diag-2 lesion is 85% stenosed.   Mid LAD lesion is 85% stenosed.   There is mild left  ventricular systolic dysfunction.   LV end diastolic pressure is normal. 1.  Moderately calcified coronary arteries with severe three-vessel coronary artery disease.  The culprit for myocardial infarction is likely occluded proximal right coronary artery.  The onset of occlusion occlusion is likely more than 12 hours with no chest pain. 2.  Mildly reduced LV systolic function with an EF of 40 to 45% with normal left ventricular end-diastolic pressure.  There is evidence of basal to mid inferior wall hypokinesis. Recommendations: Given diffuse  three-vessel coronary artery disease, I think the best option for revascularization is CABG. Resume heparin in 4 hours at 7 PM.   DG Chest Port 1 View  Result Date: 08/06/2021 CLINICAL DATA:  Coronary bypass surgery EXAM: PORTABLE CHEST 1 VIEW COMPARISON:  Previous studies including the examination of 08/05/2021 FINDINGS: Transverse diameter of heart is increased. Tip of right IJ central venous catheter is seen in the superior vena cava. Metallic sutures seen in the sternum. Increased density in the left lower lung fields suggests pleural effusion and possibly underlying infiltrate. There is faint haziness in the right lower lung fields suggesting layering of small pleural effusion. There is no pneumothorax. IMPRESSION: There are no signs of pulmonary edema. Bilateral pleural effusions, more so on the left side. There is no pneumothorax. Electronically Signed   By: Elmer Picker M.D.   On: 08/06/2021 12:25   DG CHEST PORT 1 VIEW  Result Date: 08/05/2021 CLINICAL DATA:  Status post CABG EXAM: PORTABLE CHEST 1 VIEW COMPARISON:  Yesterday FINDINGS: Right IJ Cordis sheath is unchanged. Prior median sternotomy. Midline trachea. Mild cardiomegaly. Atherosclerosis in the transverse aorta. No pleural effusion or pneumothorax. No congestive failure. Similar left and decreased right base atelectasis. IMPRESSION: Slightly improved right base aeration with persistent bibasilar atelectasis. Cardiomegaly without congestive failure. Aortic Atherosclerosis (ICD10-I70.0). Electronically Signed   By: Abigail Miyamoto M.D.   On: 08/05/2021 08:35   DG Chest Port 1 View  Result Date: 08/04/2021 CLINICAL DATA:  Status post cardiac surgery. EXAM: PORTABLE CHEST 1 VIEW COMPARISON:  August 03, 2021. FINDINGS: Stable cardiomegaly. Right internal jugular venous sheath is unchanged. No pneumothorax is noted. Increased bibasilar atelectasis is noted with probable small pleural effusions. Bony thorax is unremarkable. IMPRESSION:  Increased bibasilar atelectasis is noted with probable small pleural effusions. Electronically Signed   By: Marijo Conception M.D.   On: 08/04/2021 09:47   DG CHEST PORT 1 VIEW  Result Date: 08/03/2021 CLINICAL DATA:  Shortness of breath EXAM: PORTABLE CHEST 1 VIEW COMPARISON:  08/02/2021 FINDINGS: Interval removal of bilateral chest tubes. No pneumothorax. Interval removal of Swan-Ganz catheter. Changes of CABG. Bibasilar atelectasis, increasing on the left since prior study. Suspect small effusions. IMPRESSION: Interval removal of bilateral chest tubes without pneumothorax. Increasing left base atelectasis.  Stable right base atelectasis. Suspect small effusions. Electronically Signed   By: Rolm Baptise M.D.   On: 08/03/2021 22:52   DG Chest Port 1 View  Result Date: 08/02/2021 CLINICAL DATA:  Status post CABG. EXAM: PORTABLE CHEST 1 VIEW COMPARISON:  Yesterday FINDINGS: Extubation.  Removal of nasogastric tube. Right internal jugular Swan-Ganz catheter tip at pulmonary outflow tract. Bilateral chest tubes. Median sternotomy for CABG. Midline trachea. Mild cardiomegaly. Atherosclerosis in the transverse aorta. No pleural fluid. Improved left apical pneumothorax with suspicion of residual 5% apical pleural air. Slight increase in bibasilar atelectasis. Slightly diminished lung volumes. IMPRESSION: Extubation with slightly diminished lung volumes and increased bibasilar atelectasis. Bilateral chest tubes remaining in place. Improved tiny left  apical pneumothorax. Aortic Atherosclerosis (ICD10-I70.0). Electronically Signed   By: Abigail Miyamoto M.D.   On: 08/02/2021 08:49   DG Chest Port 1 View  Result Date: 08/01/2021 CLINICAL DATA:  Postop CABG. EXAM: PORTABLE CHEST 1 VIEW COMPARISON:  Radiographs 07/27/2021 and 10/06/2005. FINDINGS: 1502 hours. Tip of the endotracheal tube is in the mid trachea. Right IJ Swan-Ganz catheter projects to the right ventricular outflow tract or main pulmonary artery. Enteric  tube projects below the diaphragm, tip not visualized. Mediastinal drain and bilateral chest tubes are in place. The heart size and mediastinal contours are stable. There is a minimal left apical pneumothorax. Mild atelectasis is present at both lung bases. No edema or significant pleural effusion. The bones appear unremarkable. IMPRESSION: Minimal left apical pneumothorax following CABG. Support system positioned as above. Electronically Signed   By: Richardean Sale M.D.   On: 08/01/2021 15:31   DG Chest Port 1 View  Result Date: 07/27/2021 CLINICAL DATA:  Chest pain. EXAM: PORTABLE CHEST 1 VIEW COMPARISON:  Chest radiograph dated 10/06/2005 FINDINGS: No focal consolidation, pleural effusion, or pneumothorax. Top-normal cardiac size. Atherosclerotic calcification of the aorta. No acute osseous pathology. IMPRESSION: No active disease. Electronically Signed   By: Anner Crete M.D.   On: 07/27/2021 00:59   ECHOCARDIOGRAM COMPLETE  Result Date: 07/28/2021    ECHOCARDIOGRAM REPORT   Patient Name:   GILLIAM HAWKES Date of Exam: 07/28/2021 Medical Rec #:  [id number]      Height:       64.0 in Accession #:    [id number]     Weight:       152.5 lb Date of Birth:  19-Dec-1933     BSA:          1.743 m Patient Age:    78 years       BP:           103/58 mmHg Patient Gender: M              HR:           66 bpm. Exam Location:  Inpatient Procedure: 2D Echo Indications:    Nstemi  History:        Patient has prior history of Echocardiogram examinations, most                 recent 09/24/2018. Risk Factors:Hypertension.  Sonographer:    Jefferey Pica Referring Phys: [id number] URKYHCWC A ARIDA  Sonographer Comments: Image acquisition challenging due to respiratory motion. IMPRESSIONS  1. Akinesis of the basal inferior wall with overall preserved LV function; calcified aortic valve with very mild AS (mean gradient 9 mmHg).  2. Left ventricular ejection fraction, by estimation, is 55 to 60%. The left ventricle has normal  function. The left ventricle demonstrates regional wall motion abnormalities (see scoring diagram/findings for description). Left ventricular diastolic parameters are consistent with Grade I diastolic dysfunction (impaired relaxation).  3. Right ventricular systolic function is normal. The right ventricular size is normal. There is normal pulmonary artery systolic pressure.  4. The mitral valve is normal in structure. Trivial mitral valve regurgitation. No evidence of mitral stenosis.  5. The aortic valve is calcified. Aortic valve regurgitation is mild. No aortic stenosis is present.  6. Aortic dilatation noted. There is borderline dilatation of the aortic root, measuring 39 mm.  7. The inferior vena cava is normal in size with greater than 50% respiratory variability, suggesting right atrial pressure of 3 mmHg. Comparison(s): Prior images unable to be  directly viewed, comparison made by report only. FINDINGS  Left Ventricle: Left ventricular ejection fraction, by estimation, is 55 to 60%. The left ventricle has normal function. The left ventricle demonstrates regional wall motion abnormalities. The left ventricular internal cavity size was normal in size. There is no left ventricular hypertrophy. Left ventricular diastolic parameters are consistent with Grade I diastolic dysfunction (impaired relaxation). Right Ventricle: The right ventricular size is normal. Right ventricular systolic function is normal. There is normal pulmonary artery systolic pressure. The tricuspid regurgitant velocity is 1.89 m/s, and with an assumed right atrial pressure of 15 mmHg, the estimated right ventricular systolic pressure is 32.9 mmHg. Left Atrium: Left atrial size was normal in size. Right Atrium: Right atrial size was normal in size. Pericardium: There is no evidence of pericardial effusion. Mitral Valve: The mitral valve is normal in structure. Mild mitral annular calcification. Trivial mitral valve regurgitation. No evidence  of mitral valve stenosis. Tricuspid Valve: The tricuspid valve is normal in structure. Tricuspid valve regurgitation is mild . No evidence of tricuspid stenosis. Aortic Valve: The aortic valve is calcified. Aortic valve regurgitation is mild. Aortic regurgitation PHT measures 697 msec. No aortic stenosis is present. Aortic valve mean gradient measures 9.0 mmHg. Aortic valve peak gradient measures 17.7 mmHg. Aortic valve area, by VTI measures 2.20 cm. Pulmonic Valve: The pulmonic valve was normal in structure. Pulmonic valve regurgitation is not visualized. No evidence of pulmonic stenosis. Aorta: Aortic dilatation noted. There is borderline dilatation of the aortic root, measuring 39 mm. Venous: The inferior vena cava is normal in size with greater than 50% respiratory variability, suggesting right atrial pressure of 3 mmHg. IAS/Shunts: The interatrial septum is aneurysmal. No atrial level shunt detected by color flow Doppler. Additional Comments: Akinesis of the basal inferior wall with overall preserved LV function; calcified aortic valve with very mild AS (mean gradient 9 mmHg).  LEFT VENTRICLE PLAX 2D LVIDd:         4.50 cm   Diastology LVIDs:         2.80 cm   LV e' medial:    5.74 cm/s LV PW:         0.90 cm   LV E/e' medial:  8.9 LV IVS:        1.00 cm   LV e' lateral:   8.59 cm/s LVOT diam:     2.00 cm   LV E/e' lateral: 5.9 LV SV:         102 LV SV Index:   59 LVOT Area:     3.14 cm  RIGHT VENTRICLE             IVC RV Basal diam:  2.50 cm     IVC diam: 2.20 cm RV S prime:     10.30 cm/s TAPSE (M-mode): 1.3 cm LEFT ATRIUM             Index        RIGHT ATRIUM           Index LA diam:        3.40 cm 1.95 cm/m   RA Area:     12.90 cm LA Vol (A2C):   48.8 ml 27.99 ml/m  RA Volume:   30.60 ml  17.55 ml/m LA Vol (A4C):   37.1 ml 21.28 ml/m LA Biplane Vol: 43.0 ml 24.66 ml/m  AORTIC VALVE                     PULMONIC  VALVE AV Area (Vmax):    2.13 cm      PV Vmax:       0.57 m/s AV Area (Vmean):   2.14 cm       PV Peak grad:  1.3 mmHg AV Area (VTI):     2.20 cm AV Vmax:           210.50 cm/s AV Vmean:          140.500 cm/s AV VTI:            0.465 m AV Peak Grad:      17.7 mmHg AV Mean Grad:      9.0 mmHg LVOT Vmax:         143.00 cm/s LVOT Vmean:        95.600 cm/s LVOT VTI:          0.325 m LVOT/AV VTI ratio: 0.70 AI PHT:            697 msec  AORTA Ao Root diam: 3.90 cm Ao Asc diam:  3.40 cm MITRAL VALVE                TRICUSPID VALVE MV Area (PHT): 4.04 cm     TR Peak grad:   14.3 mmHg MV Decel Time: 188 msec     TR Vmax:        189.00 cm/s MV E velocity: 51.00 cm/s MV A velocity: 102.00 cm/s  SHUNTS MV E/A ratio:  0.50         Systemic VTI:  0.32 m                             Systemic Diam: 2.00 cm Kirk Ruths MD Electronically signed by Kirk Ruths MD Signature Date/Time: 07/28/2021/1:07:43 PM    Final    ECHO INTRAOPERATIVE TEE  Result Date: 08/04/2021  *INTRAOPERATIVE TRANSESOPHAGEAL REPORT *  Patient Name:   TYDARIUS YAWN Date of Exam: 08/01/2021 Medical Rec #:  [id number]      Height:       64.0 in Accession #:    [id number]     Weight:       155.9 lb Date of Birth:  [birth year]     BSA:          1.76 m Patient Age:    70 years       BP:           130/89 mmHg Patient Gender: M              HR:           85 bpm. Exam Location:  Inpatient Transesophogeal exam was perform intraoperatively during surgical procedure. Patient was closely monitored under general anesthesia during the entirety of examination. Indications:     coronary artery bypass surgery Performing Phys: [id number] Gaye Pollack Diagnosing Phys: Belinda Block MD POST-OP IMPRESSIONS _ Left Ventricle: Post Bypass: The patient came off bypass with moderate right ventricle systolic dysfunction with dilated right ventricle with inotrophe support. The TEE that was placed after induction without difficulty was removed at the end of the case uneventfully. The patient was later taken to the SICU in stable condition. _ Right Ventricle: moderately  reduced function. Moderate right ventricular systolic dysfunction that improved with inotropes. PRE-OP FINDINGS  Left Ventricle: The left ventricle has normal systolic function, with an ejection fraction of 60-65%. The cavity size was left ventricular size was not assessed. Mitral  Valve: The mitral valve is normal in structure. Tricuspid Valve: The tricuspid valve was normal in structure. Aortic Valve: The aortic valve is tricuspid Aortic valve regurgitation is mild by color flow Doppler. There is moderate calcification present. +--------------+--------++  LEFT VENTRICLE            +--------------+--------++  PLAX 2D                   +--------------+--------++  LVOT diam:     2.00 cm    +--------------+--------++  LVOT Area:     3.14 cm   +--------------+--------++                            +--------------+--------++ +-------------+------------++  AORTIC VALVE                 +-------------+------------++  AV Vmax:      203.00 cm/s    +-------------+------------++  AV Vmean:     133.000 cm/s   +-------------+------------++  AV VTI:       0.381 m        +-------------+------------++  AV Peak Grad: 16.5 mmHg      +-------------+------------++  AV Mean Grad: 8.0 mmHg       +-------------+------------++  AR PHT:       293 msec       +-------------+------------++  +--------------+-------+  SHUNTS                  +--------------+-------+  Systemic Diam: 2.00 cm  +--------------+-------+  Belinda Block MD Electronically signed by Belinda Block MD Signature Date/Time: 08/04/2021/8:34:08 PM    Final    VAS US DOPPLER PRE CABG  Result Date: 07/28/2021 PREOPERATIVE VASCULAR EVALUATION Patient Name:  RAYDEL HOSICK  Date of Exam:   07/28/2021 Medical Rec #: [id number]       Accession #:    [id number] Date of Birth: December 09, 1933      Patient Gender: M Patient Age:   89 years Exam Location:  [facility] Procedure:      VAS US DOPPLER PRE CABG Referring Phys: Gilford Raid  --------------------------------------------------------------------------------  Indications:      Pre-CABG. Risk Factors:     Hypertension, hyperlipidemia, past history of smoking, prior                   MI. Limitations:      Involuntary patient movement of both hands and feet Comparison Study: 10-25-2017 Lower extremity arterial studies showed mild                   arterial disease bilaterally. ABI 0.92/0.46 RT and 0.94/0.48                   LT. Performing Technologist: Darlin Coco RDMS RVT  Examination Guidelines: A complete evaluation includes B-mode imaging, spectral Doppler, color Doppler, and power Doppler as needed of all accessible portions of each vessel. Bilateral testing is considered an integral part of a complete examination. Limited examinations for reoccurring indications may be performed as noted.  Right Carotid Findings: +----------+--------+--------+--------+---------------------+------------------+             PSV cm/s EDV cm/s Stenosis Describe              Comments            +----------+--------+--------+--------+---------------------+------------------+  CCA Prox   60       14                                                          +----------+--------+--------+--------+---------------------+------------------+  CCA Distal 61       18                                      intimal thickening  +----------+--------+--------+--------+---------------------+------------------+  ICA Prox   130      47       40-59%   heterogenous,                                                                    hypoechoic and smooth                     +----------+--------+--------+--------+---------------------+------------------+  ICA Mid    129      50                                      tortuous            +----------+--------+--------+--------+---------------------+------------------+  ICA Distal 63       21                                      tortuous             +----------+--------+--------+--------+---------------------+------------------+  ECA        90       10                                                          +----------+--------+--------+--------+---------------------+------------------+ +----------+--------+-------+----------------+------------+             PSV cm/s EDV cms Describe         Arm Pressure  +----------+--------+-------+----------------+------------+  Subclavian 71               Multiphasic, WNL               +----------+--------+-------+----------------+------------+ +---------+--------+--+--------+--+---------+  Vertebral PSV cm/s 59 EDV cm/s 16 Antegrade  +---------+--------+--+--------+--+---------+ Left Carotid Findings: +----------+--------+--------+--------+--------+--------+             PSV cm/s EDV cm/s Stenosis Describe Comments  +----------+--------+--------+--------+--------+--------+  CCA Prox   73       21                                   +----------+--------+--------+--------+--------+--------+  CCA Distal 71       25                                   +----------+--------+--------+--------+--------+--------+  ICA Prox   94       33       1-39%    calcific           +----------+--------+--------+--------+--------+--------+  ICA Distal 76  26                                   +----------+--------+--------+--------+--------+--------+  ECA        65       11                                   +----------+--------+--------+--------+--------+--------+ +----------+--------+--------+----------------+------------+  Subclavian PSV cm/s EDV cm/s Describe         Arm Pressure  +----------+--------+--------+----------------+------------+             104               Multiphasic, WNL               +----------+--------+--------+----------------+------------+ +---------+--------+--+--------+--+---------+  Vertebral PSV cm/s 53 EDV cm/s 22 Antegrade  +---------+--------+--+--------+--+---------+  ABI Findings:  +---------+------------------+-----+----------+--------+  Right     Rt Pressure (mmHg) Index Waveform   Comment   +---------+------------------+-----+----------+--------+  Brachial  124                      triphasic            +---------+------------------+-----+----------+--------+  PTA       66                 0.53  monophasic           +---------+------------------+-----+----------+--------+  DP        81                 0.65  biphasic             +---------+------------------+-----+----------+--------+  Great Toe 50                 0.40  Abnormal             +---------+------------------+-----+----------+--------+ +---------+------------------+-----+---------+-------+  Left      Lt Pressure (mmHg) Index Waveform  Comment  +---------+------------------+-----+---------+-------+  Brachial  124                      triphasic          +---------+------------------+-----+---------+-------+  PTA       92                 0.74  biphasic           +---------+------------------+-----+---------+-------+  DP        102                0.82  biphasic           +---------+------------------+-----+---------+-------+  Great Toe 80                 0.65  Abnormal           +---------+------------------+-----+---------+-------+ +-------+---------------+----------------+  ABI/TBI Today's ABI/TBI Previous ABI/TBI  +-------+---------------+----------------+  Right   0.65/0.40       0.92/0.46         +-------+---------------+----------------+  Left    0.82/0.65       0.94/0.48         +-------+---------------+----------------+  Right Doppler Findings: +--------+--------+-----+---------+--------+  Site     Pressure Index Doppler   Comments  +--------+--------+-----+---------+--------+  Brachial 124            triphasic           +--------+--------+-----+---------+--------+  Radial                  triphasic           +--------+--------+-----+---------+--------+  Ulnar                   triphasic            +--------+--------+-----+---------+--------+  Left Doppler Findings: +--------+--------+-----+---------+--------+  Site     Pressure Index Doppler   Comments  +--------+--------+-----+---------+--------+  Brachial 124            triphasic           +--------+--------+-----+---------+--------+  Radial                  triphasic           +--------+--------+-----+---------+--------+  Ulnar                   triphasic           +--------+--------+-----+---------+--------+  Summary: Right Carotid: Velocities in the right ICA are consistent with a 40-59%                stenosis. Left Carotid: Velocities in the left ICA are consistent with a 1-39% stenosis. Vertebrals:  Bilateral vertebral arteries demonstrate antegrade flow. Subclavians: Normal flow hemodynamics were seen in bilateral subclavian              arteries. Right ABI: Resting right ankle-brachial index indicates moderate right lower extremity arterial disease. The right toe-brachial index is abnormal. Right ABIs appear decreased as compared to previous examination on 10-25-2017. Left ABI: Resting left ankle-brachial index indicates mild left lower extremity arterial disease. The left toe-brachial index is abnormal. Left ABIs appear essentially unchanged as compared to previous examination on 10-25-2017. Right Upper Extremity: Doppler waveform obliterate with right radial compression. Doppler waveforms remain within normal limits with right ulnar compression. Left Upper Extremity: Doppler waveform obliterate with left radial compression. Doppler waveforms remain within normal limits with left ulnar compression.  Electronically signed by Orlie Pollen on 07/28/2021 at 6:59:27 PM.    Final    VAS [place] UPPER EXTREMITY VENOUS DUPLEX  Result Date: 08/08/2021 UPPER VENOUS STUDY  Patient Name:  CHAS AXEL  Date of Exam:   08/08/2021 Medical Rec #: [id number]       Accession #:    [id number] Date of Birth: 27-Sep-1933      Patient Gender: M Patient Age:   50 years  Exam Location:  [facility] Procedure:      VAS [place] UPPER EXTREMITY VENOUS DUPLEX Referring Phys: Patrick Jupiter Jarae Panas --------------------------------------------------------------------------------  Indications: Swelling Comparison Study: No previous exam Performing Technologist: Jody Hill RVT, RDMS  Examination Guidelines: A complete evaluation includes B-mode imaging, spectral Doppler, color Doppler, and power Doppler as needed of all accessible portions of each vessel. Bilateral testing is considered an integral part of a complete examination. Limited examinations for reoccurring indications may be performed as noted.  Right Findings: +----------+------------+---------+-----------+----------+-------+  RIGHT      Compressible Phasicity Spontaneous Properties Summary  +----------+------------+---------+-----------+----------+-------+  Subclavian     Full        Yes        Yes                         +----------+------------+---------+-----------+----------+-------+  Left Findings: +----------+------------+---------+-----------+----------+--------------------+  LEFT       Compressible Phasicity Spontaneous Properties       Summary         +----------+------------+---------+-----------+----------+--------------------+  IJV            Full        Yes        Yes                                      +----------+------------+---------+-----------+----------+--------------------+  Subclavian     Full        Yes        Yes                                      +----------+------------+---------+-----------+----------+--------------------+  Axillary       Full        Yes        Yes                                      +----------+------------+---------+-----------+----------+--------------------+  Brachial       Full        Yes        Yes                                      +----------+------------+---------+-----------+----------+--------------------+  Radial         Full                                                             +----------+------------+---------+-----------+----------+--------------------+  Ulnar          Full                                                            +----------+------------+---------+-----------+----------+--------------------+  Cephalic       None        No         No                  Acute - area of AC                                                                    fossa          +----------+------------+---------+-----------+----------+--------------------+  Basilic        Full        Yes        Yes                                      +----------+------------+---------+-----------+----------+--------------------+  Summary:  Right: No evidence of thrombosis in the subclavian.  Left: No evidence of deep vein thrombosis in the upper extremity. Findings consistent with acute superficial vein thrombosis involving the left cephalic vein.  *See table(s) above for measurements and observations.  Diagnosing physician: Monica Martinez MD Electronically signed by Monica Martinez MD on 08/08/2021 at 1:48:43 PM.    Final      Treatments: surgery:  08/01/2021   Surgeon:  Gaye Pollack, MD   First Assistant: Jadene Pierini,  PA-C:     Preoperative Diagnosis:  Severe multi-vessel coronary artery disease    Postoperative Diagnosis:  Same    Procedure:   Median Sternotomy Extracorporeal circulation 3.   Coronary artery bypass grafting x 3   Right internal mammary artery graft to the LAD Left internal mammary artery graft to the OM1 SVG to OM2   4.   Endoscopic vein harvest from both legs    Operative findings:   Acute pericarditis from recent IMI with RV infarct and moderate RV dilation and hypokinesis.   Anesthesia:  General Endotracheal  Discharge Exam: Blood pressure (!) 105/53, pulse 65, temperature (!) 97.5 F (36.4 C), temperature source Oral, resp. rate 16, height 5\' 4"  (1.626 m), weight 72 kg, SpO2 96 %.   General appearance: alert, cooperative, and no  distress Heart: regular rate and rhythm and frequent extrasystoles Lungs: mildl dim in bases Abdomen: benign Extremities: + LE edema, left arm swelling improved Wound: incis healing well   Discharge Medications:  The patient has been discharged on:   1.Beta Blocker:  Yes [  y ]                              No   [   ]                              If No, reason:  2.Ace Inhibitor/ARB: Yes [   ]                                     No  [ n   ]                                     If No, reason:renal insuff  3.Statin:   Yes [ y  ]                  No  [   ]                  If No, reason:  4.Ecasa:  Yes  [ y  ]                  No   [   ]                  If No, reason:  Patient had ACS upon admission:NSTEMI  Plavix/P2Y12 inhibitor: Yes [   ]                                      No  [ n  ]     Discharge Instructions     Amb Referral to Cardiac Rehabilitation  Complete by: As directed    Diagnosis: CABG   CABG X ___: 3   After initial evaluation and assessments completed: Virtual Based Care may be provided alone or in conjunction with Phase 2 Cardiac Rehab based on patient barriers.: Yes   Discharge patient   Complete by: As directed    When has med from [place]   Discharge disposition: 01-Home or Self Care   Discharge patient date: 08/10/2021      Allergies as of 08/10/2021       Reactions   Lipitor [atorvastatin]    Muscle soreness        Medication List     STOP taking these medications    ezetimibe 10 MG tablet Commonly known as: ZETIA   hydrochlorothiazide 25 MG tablet Commonly known as: HYDRODIURIL   hyoscyamine 0.125 MG SL tablet Commonly known as: LEVSIN SL   losartan 100 MG tablet Commonly known as: COZAAR   potassium chloride 10 MEQ tablet Commonly known as: KLOR-CON   triamcinolone ointment 0.1 % Commonly known as: KENALOG       TAKE these medications    aspirin 325 MG EC tablet Take 1 tablet (325 mg total) by mouth  daily. What changed:  medication strength how much to take additional instructions   cephALEXin 500 MG capsule Commonly known as: KEFLEX Take 1 capsule (500 mg total) by mouth every 8 (eight) hours.   furosemide 40 MG tablet Commonly known as: Lasix Take 1 tablet (40 mg total) by mouth daily.   melatonin 3 MG Tabs tablet Take 1 tablet (3 mg total) by mouth at bedtime.   metoprolol tartrate 25 MG tablet Commonly known as: LOPRESSOR Take 1 tablet (25 mg total) by mouth 2 (two) times daily.   potassium chloride SA 20 MEQ tablet Commonly known as: KLOR-CON M Take 1 tablet (20 mEq total) by mouth daily.   rosuvastatin 20 MG tablet Commonly known as: CRESTOR Take 1 tablet (20 mg total) by mouth daily.   traMADol 50 MG tablet Commonly known as: ULTRAM Take 1 tablet (50 mg total) by mouth every 12 (twelve) hours as needed for up to 7 days for moderate pain.               Durable Medical Equipment  (From admission, onward)           Start     Ordered   08/09/21 1107  For home use only DME 3 n 1  Once        08/09/21 1106   08/06/21 1229  For home use only DME Walker rolling  Once       Question Answer Comment  Walker: With 5 Inch Wheels   Patient needs a walker to treat with the following condition S/P CABG (coronary artery bypass graft)   Patient needs a walker to treat with the following condition Physical deconditioning      08/06/21 1228            Follow-up Information     [facility] Follow up.   Specialty: Home Health Services Why: HHPT Contact information: [place] [address] [phone]         Nahser, Wonda Cheng, MD Follow up.   Specialty: Cardiology Why: Please see discharge paperwork for details of follow-up appointment with cardiology. Contact information: [place] [place] [address] [phone]         Gaye Pollack, MD Follow  up.   Specialty: Cardiothoracic  Surgery Why: Please see discharge paperwork for follow-up appointment with your surgeon.  You will also have a appointment for suture removal with the nurse at surgeon office.  On the date you see Dr. Mohammed Kindle please obtain a chest x-ray at [facility] 1/2-hour prior to appointment.  It is located in the same office complex on the first floor. Contact information: [place] [address] [place] [address] [facility] Follow up.   Why: ([employer])- Rolling walker and 3n1 arranged- to be delivered to room prior to discharge Contact information: [place] [address] [phone]                 Signed: John Giovanni, PA-C  08/10/2021, 11:15 AM

## 2021-08-03 NOTE — Progress Notes (Signed)
CV okay.

## 2021-08-03 NOTE — Progress Notes (Signed)
RN paged Dr Cyndia Bent in regard to patients acute change in oxygen demands. RN made Dr Cyndia Bent aware of recent Chest Xray results. Patient remains on 25L o2 at this time. No new orders given, will reassess and attempt to wean o2 as patient Spo2 tolerates.

## 2021-08-03 NOTE — Progress Notes (Incomplete)
0700 - Report received from PM RN.  All questions answered.  Safety checks performed.  All lines and drips verified. Hand hygiene performed before/after each pt contact. [place], TCTS PA rounded

## 2021-08-03 NOTE — Progress Notes (Addendum)
[address]       [place] [address]             [phone]      TCTS DAILY ICU PROGRESS NOTE                   [address]            [place] [address]          [phone]   2 Days Post-Op Procedure(s) (LRB): CORONARY ARTERY BYPASS GRAFTING (CABG) X 3  ,ON PUMP, USING LEFT AND RIGHT INTERNAL MAMMARY ARTERIES, RIGHT AND LEFT ENDOSCOPIC GREATER SAPHENOUS VEIN CONDUITS (N/A) TRANSESOPHAGEAL ECHOCARDIOGRAM (TEE) (N/A) APPLICATION OF CELL SAVER ENDOVEIN HARVEST OF GREATER SAPHENOUS VEIN (Right)  Total Length of Stay:  LOS: 7 days   Subjective: Not hungry , pain controlled  Objective: Vital signs in last 24 hours: Temp:  [98 F (36.7 C)-99.3 F (37.4 C)] 98 F (36.7 C) (01/25 0700) Pulse Rate:  [86-96] 90 (01/25 0600) Cardiac Rhythm: Atrial paced (01/25 0400) Resp:  [10-22] 14 (01/25 0600) BP: (91-131)/(36-118) 99/65 (01/25 0600) SpO2:  [69 %-100 %] 93 % (01/25 0600) Arterial Line BP: (91-187)/(40-127) 137/52 (01/25 0600) Weight:  [78.5 kg] 78.5 kg (01/25 0448)  Filed Weights   08/01/21 0600 08/02/21 0500 08/03/21 0448  Weight: 70.7 kg 73.5 kg 78.5 kg    Weight change: 5 kg   Hemodynamic parameters for last 24 hours: PAP: (20-34)/(9-19) 33/18 CO:  [4.4 L/min-5.2 L/min] 4.4 L/min CI:  [2.5 L/min/m2-3 L/min/m2] 2.5 L/min/m2  Intake/Output from previous day: 01/24 0701 - 01/25 0700 In: 1707.3 [P.O.:480; I.V.:1017.3; IV Piggyback:200] Out: 940 [Urine:630; Chest Tube:310]  Intake/Output this shift: No intake/output data recorded.  Current Meds: Scheduled Meds:  acetaminophen  1,000 mg Oral Q6H   Or   acetaminophen (TYLENOL) oral liquid 160 mg/5 mL  1,000 mg Per Tube Q6H   aspirin EC  325 mg Oral Daily   Or   aspirin  324 mg Per Tube Daily   bisacodyl  10 mg Oral Daily   Or   bisacodyl  10 mg Rectal Daily   Chlorhexidine Gluconate Cloth  6 each Topical Daily   docusate sodium  200 mg Oral Daily   insulin aspart   0-15 Units Subcutaneous Q4H   insulin detemir  30 Units Subcutaneous Daily   mouth rinse  15 mL Mouth Rinse BID   pantoprazole  40 mg Oral Daily   rosuvastatin  20 mg Oral Daily   sodium chloride flush  10-40 mL Intracatheter Q12H   sodium chloride flush  3 mL Intravenous Q12H   Continuous Infusions:  sodium chloride Stopped (08/01/21 2025)   sodium chloride Stopped (08/02/21 0817)   sodium chloride 20 mL/hr at 08/01/21 1606   epinephrine 1 mcg/min (08/03/21 0400)   lactated ringers Stopped (08/01/21 1549)   lactated ringers 20 mL/hr at 08/03/21 0400   milrinone 0.125 mcg/kg/min (08/02/21 1156)   nitroGLYCERIN     phenylephrine (NEO-SYNEPHRINE) Adult infusion Stopped (08/03/21 0019)   PRN Meds:.sodium chloride, morphine injection, ondansetron (ZOFRAN) IV, oxyCODONE, sodium chloride flush, sodium chloride flush, traMADol  General appearance: alert, cooperative, and no distress Heart: regular rate and rhythm Lungs: dim in bases Abdomen: soft, + BS Extremities: minor edema Wound: EVH sites ok, chest dressing in place  Lab Results: CBC: Recent Labs    08/02/21 1716 08/03/21 0430  WBC 9.6 9.4  HGB 9.5* 9.5*  HCT 27.8*  28.6*  PLT 143* 134*   BMET:  Recent Labs    08/02/21 1716 08/03/21 0430  NA 139 135  K 4.5 4.4  CL 106 103  CO2 23 24  GLUCOSE 95 87  BUN 38* 41*  CREATININE 1.62* 1.66*  CALCIUM 8.1* 8.2*    CMET: Lab Results  Component Value Date   WBC 9.4 08/03/2021   HGB 9.5 (L) 08/03/2021   HCT 28.6 (L) 08/03/2021   PLT 134 (L) 08/03/2021   GLUCOSE 87 08/03/2021   CHOL 162 07/28/2021   TRIG 54 07/28/2021   HDL 57 07/28/2021   LDLCALC 94 07/28/2021   ALT 17 07/27/2021   AST 22 07/27/2021   NA 135 08/03/2021   K 4.4 08/03/2021   CL 103 08/03/2021   CREATININE 1.66 (H) 08/03/2021   BUN 41 (H) 08/03/2021   CO2 24 08/03/2021   TSH 1.029 07/27/2021   INR 1.5 (H) 08/01/2021   HGBA1C 5.5 07/27/2021      PT/INR:  Recent Labs    08/01/21 1458   LABPROT 18.2*  INR 1.5*   Radiology: No results found.   Assessment/Plan: S/P Procedure(s) (LRB): CORONARY ARTERY BYPASS GRAFTING (CABG) X 3  ,ON PUMP, USING LEFT AND RIGHT INTERNAL MAMMARY ARTERIES, RIGHT AND LEFT ENDOSCOPIC GREATER SAPHENOUS VEIN CONDUITS (N/A) TRANSESOPHAGEAL ECHOCARDIOGRAM (TEE) (N/A) APPLICATION OF CELL SAVER ENDOVEIN HARVEST OF GREATER SAPHENOUS VEIN (Right)  POD#2 1 Tmax 99.3, VSS, on epi and milrinone, off neo, episode of hypoglycemia- will stop levemir and cont SSI , not a diabetic A paced - sinus underneath 2 fair UOP creat  fairly stable at 1.66, weight up 10 kg from preop if accurate- will need some diuretic prob when off pressors 3 expected ABLA is pretty stable 4 minor thrombocytopenia- monitor 5 sats ok on 2 l 6 CT 310 cc, sero sang- prob d/c soon 7 routine rehab and pulm hygiene    John Giovanni PA-C Pager [phone] 08/03/2021 7:33 AM    Chart reviewed, patient examined, agree with above. He is doing fairly well overall.  Acute kidney injury likely related to periop hypotension. Creat stable at 1.66. Hold off on diuresis for now. DC chest tubes, arterial line. DC milrinone, then epi.  IS, ambulation.

## 2021-08-03 NOTE — Progress Notes (Signed)
Patient ID: Danny [name], male   DOB: 1934-02-01, 88 y.o.   MRN: [id number] TCTS evening rounds:  Hemodynamically stable. Atrial paced 90 to maximize CO and BP. Milrinone and epi off.  Has not passed urine since foley removed. Bladder scanned and 175 cc present. With increased creat postop will give him a unit of albumin and start low dose dopamine for renal perfusion.  Awake and alert. No complaints.

## 2021-08-03 NOTE — Progress Notes (Signed)
Pt moved from chair to bed with no complaints. Once patient was in bed RN noticed SpO2 decreasing in the 80's with increased o2 demands. Pt is not SHOB and NAD noted at this time. Pt went form 1LNC to 25L  NRB and [place]. Ordered STAT Chest X-ray and will await results.

## 2021-08-04 ENCOUNTER — Inpatient Hospital Stay (HOSPITAL_COMMUNITY): Payer: Medicare PPO

## 2021-08-04 DIAGNOSIS — Z951 Presence of aortocoronary bypass graft: Secondary | ICD-10-CM

## 2021-08-04 LAB — TYPE AND SCREEN
ABO/RH(D): A POS
Antibody Screen: NEGATIVE
Unit division: 0
Unit division: 0
Unit division: 0
Unit division: 0

## 2021-08-04 LAB — BPAM RBC
Blood Product Expiration Date: 202302212359
Blood Product Expiration Date: 202302212359
Blood Product Expiration Date: 202302212359
Blood Product Expiration Date: 202302212359
ISSUE DATE / TIME: 202301230812
ISSUE DATE / TIME: 202301230812
ISSUE DATE / TIME: 202301230812
ISSUE DATE / TIME: 202301230812
Unit Type and Rh: 6200
Unit Type and Rh: 6200
Unit Type and Rh: 6200
Unit Type and Rh: 6200

## 2021-08-04 LAB — BASIC METABOLIC PANEL
Anion gap: 7 (ref 5–15)
BUN: 49 mg/dL — ABNORMAL HIGH (ref 8–23)
CO2: 25 mmol/L (ref 22–32)
Calcium: 8.4 mg/dL — ABNORMAL LOW (ref 8.9–10.3)
Chloride: 104 mmol/L (ref 98–111)
Creatinine, Ser: 1.6 mg/dL — ABNORMAL HIGH (ref 0.61–1.24)
GFR, Estimated: 42 mL/min — ABNORMAL LOW (ref >60)
Glucose, Bld: 105 mg/dL — ABNORMAL HIGH (ref 70–99)
Potassium: 4.5 mmol/L (ref 3.5–5.1)
Sodium: 136 mmol/L (ref 135–145)

## 2021-08-04 LAB — ECHO INTRAOPERATIVE TEE
AV Mean grad: 8 mmHg
AV Peak grad: 16.5 mmHg
Ao pk vel: 2.03 m/s
Height: 64 in
P 1/2 time: 293 msec
Weight: 2493.84 oz

## 2021-08-04 LAB — CBC
HCT: 30.7 % — ABNORMAL LOW (ref 39.0–52.0)
Hemoglobin: 10.2 g/dL — ABNORMAL LOW (ref 13.0–17.0)
MCH: 32.4 pg (ref 26.0–34.0)
MCHC: 33.2 g/dL (ref 30.0–36.0)
MCV: 97.5 fL (ref 80.0–100.0)
Platelets: 166 10*3/uL (ref 150–400)
RBC: 3.15 MIL/uL — ABNORMAL LOW (ref 4.22–5.81)
RDW: 15.5 % (ref 11.5–15.5)
WBC: 9.2 10*3/uL (ref 4.0–10.5)
nRBC: 0 % (ref 0.0–0.2)

## 2021-08-04 LAB — GLUCOSE, CAPILLARY
Glucose-Capillary: 97 mg/dL (ref 70–99)
Glucose-Capillary: 88 mg/dL (ref 70–99)
Glucose-Capillary: 53 mg/dL — ABNORMAL LOW (ref 70–99)
Glucose-Capillary: 74 mg/dL (ref 70–99)
Glucose-Capillary: 74 mg/dL (ref 70–99)
Glucose-Capillary: 128 mg/dL — ABNORMAL HIGH (ref 70–99)
Glucose-Capillary: 80 mg/dL (ref 70–99)

## 2021-08-04 LAB — COOXEMETRY PANEL
Carboxyhemoglobin: 0.6 % (ref 0.5–1.5)
Methemoglobin: 1 % (ref 0.0–1.5)
O2 Saturation: 59 %
Total hemoglobin: 10.3 g/dL — ABNORMAL LOW (ref 12.0–16.0)

## 2021-08-04 MED ORDER — FUROSEMIDE 10 MG/ML IJ SOLN
80.0000 mg | Freq: Once | INTRAMUSCULAR | Status: AC
  Administered 2021-08-04: 80 mg via INTRAVENOUS
  Filled 2021-08-04: qty 8

## 2021-08-04 NOTE — Progress Notes (Signed)
3 Days Post-Op Procedure(s) (LRB): CORONARY ARTERY BYPASS GRAFTING (CABG) X 3  ,ON PUMP, USING LEFT AND RIGHT INTERNAL MAMMARY ARTERIES, RIGHT AND LEFT ENDOSCOPIC GREATER SAPHENOUS VEIN CONDUITS (N/A) TRANSESOPHAGEAL ECHOCARDIOGRAM (TEE) (N/A) APPLICATION OF CELL SAVER ENDOVEIN HARVEST OF GREATER SAPHENOUS VEIN (Right) Subjective:  No complaints. Ambulated this am.  Increased oxygen requirement overnight. CXR overnight showed small pleural effusions and bibasilar atelectasis.  Objective: Vital signs in last 24 hours: Temp:  [97.8 F (36.6 C)-98.4 F (36.9 C)] 98.2 F (36.8 C) (01/26 0400) Pulse Rate:  [89-96] 89 (01/26 0630) Cardiac Rhythm: Atrial paced (01/26 0400) Resp:  [9-30] 12 (01/26 0630) BP: (90-150)/(58-85) 98/85 (01/26 0600) SpO2:  [79 %-98 %] 96 % (01/26 0630) Arterial Line BP: (105-131)/(49-53) 105/50 (01/25 1000) Weight:  [78.4 kg] 78.4 kg (01/26 0443)  Hemodynamic parameters for last 24 hours:    Intake/Output from previous day: 01/25 0701 - 01/26 0700 In: 654.2 [P.O.:600; I.V.:51.9; IV Piggyback:2.3] Out: 830 [Urine:810; Chest Tube:20] Intake/Output this shift: No intake/output data recorded.  General appearance: alert and cooperative Neurologic: intact Heart: regular rate and rhythm Lungs: diminished breath sounds bibasilar Extremities: edema moderate Wound: dressing dry  Lab Results: Recent Labs    08/03/21 0430 08/04/21 0451  WBC 9.4 9.2  HGB 9.5* 10.2*  HCT 28.6* 30.7*  PLT 134* 166   BMET:  Recent Labs    08/03/21 0430 08/04/21 0451  NA 135 136  K 4.4 4.5  CL 103 104  CO2 24 25  GLUCOSE 87 105*  BUN 41* 49*  CREATININE 1.66* 1.60*  CALCIUM 8.2* 8.4*    PT/INR:  Recent Labs    08/01/21 1458  LABPROT 18.2*  INR 1.5*   ABG    Component Value Date/Time   PHART 7.332 (L) 08/01/2021 2054   HCO3 23.2 08/01/2021 2054   TCO2 24 08/01/2021 2054   ACIDBASEDEF 3.0 (H) 08/01/2021 2054   O2SAT 59.0 08/04/2021 0451   CBG (last 3)   Recent Labs    08/03/21 1942 08/03/21 2339 08/04/21 0351  GLUCAP 136* 98 97    Assessment/Plan: S/P Procedure(s) (LRB): CORONARY ARTERY BYPASS GRAFTING (CABG) X 3  ,ON PUMP, USING LEFT AND RIGHT INTERNAL MAMMARY ARTERIES, RIGHT AND LEFT ENDOSCOPIC GREATER SAPHENOUS VEIN CONDUITS (N/A) TRANSESOPHAGEAL ECHOCARDIOGRAM (TEE) (N/A) APPLICATION OF CELL SAVER ENDOVEIN HARVEST OF GREATER SAPHENOUS VEIN (Right)  POD 3  Hemodynamically stable in sinus rhythm. Atrial paced at 90 to maximize CO. 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 Co-ox 59% this am on low dose dop 3 for renal perfusion. Hold off on beta blocker for now.  Acute kidney injury due to preop and periop hypotension likely due to RV infarct with some bradycardia and pauses preop and marked hypotension after anesthetic induction. Creat is stable at 1.6.  Volume excess with increased oxygen requirement. Wt is about 18 lbs over preop so will give a dose of lasix this am. Continue low dose dopamine.  IS, ambulation.    LOS: 8 days    Gaye Pollack 08/04/2021

## 2021-08-04 NOTE — Progress Notes (Signed)
Noon CBG came back at 15, gave 8oz Grape Juice and rechecked 15 minutes later, up to 74. Will continue to monitor.

## 2021-08-04 NOTE — Progress Notes (Signed)
° °   °  [address]       [place] [address]             [phone]      POD # 3 CABG x 3  Up in chair  BP 115/72    Pulse 89    Temp 98 F (36.7 C) (Oral)    Resp 12    Ht 5\' 4"  (1.626 m)    Wt 78.4 kg    SpO2 96%    BMI 29.67 kg/m  5L  Dopamine @ 3   Intake/Output Summary (Last 24 hours) at 08/04/2021 1756 Last data filed at 08/04/2021 1730 Gross per 24 hour  Intake 338.09 ml  Output 2845 ml  Net -2506.91 ml   No evening labs CBG well controlled  Good UO. Monitor renal function  Revonda Standard. Roxan Hockey, MD [facility] [phone]

## 2021-08-04 NOTE — Evaluation (Signed)
Occupational Therapy Evaluation Patient Details Name: Danny [name] MRN: [id number] DOB: 18-Jul-1933 Today's Date: 08/04/2021   History of Present Illness Pt is an 88 y.o. male who presented 07/27/21 after being found unresponsive and shaking. Pt admitted with NSTEMI. S/p cardiac cath 1/18 which revealed triple-vessel disease. 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 MRI of brain negative. PMH: diverticulitis, HTN, HLD   Clinical Impression   PTA, pt was living with his wife and daughter; daughter assisting with IADLs. Pt currently requiring Mod A for UB ADLs, Max A for LB ADLs, and Min-Mod A for functional mobility with use of eva walker. VSS on 4L. Pt presenting with decreased balance, strength, cognition, and activity tolerance. Pt would benefit from further acute OT to facilitate safe dc. Recommend dc to AIR for further OT to optimize safety, independence with ADLs, and return to PLOF.       Recommendations for follow up therapy are one component of a multi-disciplinary discharge planning process, led by the attending physician.  Recommendations may be updated based on patient status, additional functional criteria and insurance authorization.   Follow Up Recommendations  Acute inpatient rehab (3hours/day)    Assistance Recommended at Discharge Frequent or constant Supervision/Assistance  Patient can return home with the following A lot of help with walking and/or transfers;A lot of help with bathing/dressing/bathroom;Assistance with cooking/housework    Functional Status Assessment  Patient has had a recent decline in their functional status and demonstrates the ability to make significant improvements in function in a reasonable and predictable amount of time.  Equipment Recommendations  Other (comment) (Defer to next venue)    Recommendations for Other Services       Precautions / Restrictions Precautions Precautions: Fall;Other (comment);Sternal Precaution Booklet Issued: Yes (comment) Precaution  Comments: monitor SpO2; temp pacer; reviewed sternal precautions handout Restrictions Other Position/Activity Restrictions: sternal precautions      Mobility Bed Mobility               General bed mobility comments: Up in chair upon arrival.    Transfers Overall transfer level: Needs assistance Equipment used: Rolling walker (2 wheels) Transfers: Sit to/from Stand Sit to Stand: Mod assist           General transfer comment: Mod A for power up and weight shift forward      Balance Overall balance assessment: Needs assistance Sitting-balance support: No upper extremity supported, Feet supported Sitting balance-Leahy Scale: Fair     Standing balance support: Bilateral upper extremity supported, During functional activity, Reliant on assistive device for balance Standing balance-Leahy Scale: Poor Standing balance comment: Reliant on UE support and minA.                           ADL either performed or assessed with clinical judgement   ADL Overall ADL's : Needs assistance/impaired Eating/Feeding: Set up;Sitting   Grooming: Supervision/safety;Set up;Sitting   Upper Body Bathing: Moderate assistance;Sitting   Lower Body Bathing: Maximal assistance;Sit to/from stand   Upper Body Dressing : Moderate assistance;Sitting   Lower Body Dressing: Maximal assistance;Sit to/from stand Lower Body Dressing Details (indicate cue type and reason): Limited ROM for figure four, able to do it but with physical A Toilet Transfer: Moderate assistance;Ambulation (eva walker)           Functional mobility during ADLs: Moderate assistance (eva walker) General ADL Comments: Pt presenting with decreased balance, strength, cognition, and activity tolerance.     Vision  Perception     Praxis      Pertinent Vitals/Pain Pain Assessment Pain Assessment: Faces Faces Pain Scale: Hurts little more Pain Location: sternum, surgical location, scrotum and penis  with noted edema Pain Descriptors / Indicators: Discomfort, Grimacing, Operative site guarding Pain Intervention(s): Monitored during session, Limited activity within patient's tolerance, Repositioned     Hand Dominance     Extremity/Trunk Assessment Upper Extremity Assessment Upper Extremity Assessment: Generalized weakness   Lower Extremity Assessment Lower Extremity Assessment: Defer to PT evaluation   Cervical / Trunk Assessment Cervical / Trunk Assessment: Kyphotic   Communication Communication Communication: HOH   Cognition Arousal/Alertness: Awake/alert Behavior During Therapy: WFL for tasks assessed/performed Overall Cognitive Status: Impaired/Different from baseline Area of Impairment: Attention, Memory, Problem solving, Following commands                   Current Attention Level: Sustained Memory: Decreased short-term memory Following Commands: Follows multi-step commands inconsistently, Follows one step commands with increased time     Problem Solving: Slow processing, Difficulty sequencing, Requires verbal cues General Comments: Pt A&O but is slow to process info or cues, needing repetition and extra time to follow directions. Difficulty sequencing while using urinal in sitting     General Comments  SpO2 98% on 4L. HR 90s. RR 17.    Exercises Exercises: Other exercises Other Exercises Other Exercises: IS pulled 1000 ml; x3   Shoulder Instructions      Home Living Family/patient expects to be discharged to:: Private residence Living Arrangements: Spouse/significant other;Children (daughter) Available Help at Discharge: Family;Available 24 hours/day Type of Home: House Home Access: Ramped entrance     Home Layout: One level     Bathroom Shower/Tub: Occupational psychologist: Standard     Home Equipment: Grab bars - tub/shower;Grab bars - toilet;Shower seat   Additional Comments: Not on O2 at home. Wife uses home O2 and a RW.  Daughter also lives with them and does not work.      Prior Functioning/Environment Prior Level of Function : Independent/Modified Independent             Mobility Comments: Denies any recent falls or using an AD at baseline.          OT Problem List: Decreased strength;Decreased range of motion;Decreased activity tolerance;Impaired balance (sitting and/or standing)      OT Treatment/Interventions: Self-care/ADL training;Therapeutic exercise;Neuromuscular education;DME and/or AE instruction;Patient/family education    OT Goals(Current goals can be found in the care plan section) Acute Rehab OT Goals Patient Stated Goal: Get stronger OT Goal Formulation: With patient Time For Goal Achievement: 08/18/21 Potential to Achieve Goals: Good  OT Frequency: Min 2X/week    Co-evaluation              AM-PAC OT "6 Clicks" Daily Activity     Outcome Measure Help from another person eating meals?: None Help from another person taking care of personal grooming?: A Little Help from another person toileting, which includes using toliet, bedpan, or urinal?: A Lot Help from another person bathing (including washing, rinsing, drying)?: A Lot Help from another person to put on and taking off regular upper body clothing?: A Lot Help from another person to put on and taking off regular lower body clothing?: A Lot 6 Click Score: 15   End of Session Equipment Utilized During Treatment: Oxygen Nurse Communication: Mobility status  Activity Tolerance: Patient tolerated treatment well Patient left: in chair;with call bell/phone within reach  OT  Visit Diagnosis: Unsteadiness on feet (R26.81);Other abnormalities of gait and mobility (R26.89);Muscle weakness (generalized) (M62.81)                Time: 0929-5747 OT Time Calculation (min): 28 min Charges:  OT General Charges $OT Visit: 1 Visit OT Evaluation $OT Eval Moderate Complexity: 1 Mod OT Treatments $Self Care/Home Management :  8-22 mins  Weber Monnier MSOT, OTR/L Acute Rehab Pager: [phone] Office: [place] 08/04/2021, 4:48 PM

## 2021-08-04 NOTE — Evaluation (Signed)
Physical Therapy Evaluation Patient Details Name: Danny [name] MRN: [id number] DOB: 1934-06-12 Today's Date: 08/04/2021  History of Present Illness  Pt is an 88 y.o. male who presented 07/27/21 after being found unresponsive and shaking. Pt admitted with NSTEMI. S/p cardiac cath 1/18 which revealed triple-vessel disease. 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 MRI of brain negative. PMH: diverticulitis, HTN, HLD   Clinical Impression  Pt presents with condition above and deficits mentioned below, see PT Problem List. PTA, he was independent without an AD, living with his wife and daughter in a 1-level house with ramped entrance. He reports his wife uses a RW and home O2 and his daughter does not work. Attempted to confirm info with family via phone call, but no success. Currently, pt displays deficits in cognition, balance, gross functional leg strength bil, and activity tolerance. He is at risk for falls, requiring minA+2 to transfer to stand and minA to ambulate household distances with a RW. Educated pt and provided handout on sternal precautions and exercises following CABG. Will need to review again and begin exercises at a later date. Due to pt's significant functional decline from baseline, ability to tolerate more therapy, and motivation to improve, he would greatly benefit from intensive therapy in the AIR setting. Will continue to follow acutely.      Recommendations for follow up therapy are one component of a multi-disciplinary discharge planning process, led by the attending physician.  Recommendations may be updated based on patient status, additional functional criteria and insurance authorization.  Follow Up Recommendations Acute inpatient rehab (3hours/day)    Assistance Recommended at Discharge Frequent or constant Supervision/Assistance  Patient can return home with the following  A little help with walking and/or transfers;A little help with bathing/dressing/bathroom;Assistance with  cooking/housework;Direct supervision/assist for medications management;Direct supervision/assist for financial management;Assist for transportation;Help with stairs or ramp for entrance    Equipment Recommendations Rolling walker (2 wheels)  Recommendations for Other Services  Rehab consult    Functional Status Assessment Patient has had a recent decline in their functional status and demonstrates the ability to make significant improvements in function in a reasonable and predictable amount of time.     Precautions / Restrictions Precautions Precautions: Fall;Other (comment);Sternal Precaution Booklet Issued: Yes (comment) Precaution Comments: monitor SpO2; temp pacer; reviewed sternal precautions handout Restrictions Other Position/Activity Restrictions: sternal precautions      Mobility  Bed Mobility               General bed mobility comments: Up in chair upon arrival.    Transfers Overall transfer level: Needs assistance Equipment used: Rolling walker (2 wheels) Transfers: Sit to/from Stand Sit to Stand: Min assist, +2 safety/equipment, +2 physical assistance           General transfer comment: Cues provided for pt to place hands on lap to transfer to stand and comply with sternal precautions, pt hugging heart pillow instead. Required minAx2 to extend hips and provide stability with power up to stand, with difficulty noted initiating buttocks clearance.    Ambulation/Gait Ambulation/Gait assistance: Min assist, +2 safety/equipment Gait Distance (Feet): 180 Feet Assistive device: Rolling walker (2 wheels) Gait Pattern/deviations: Step-through pattern, Decreased stride length, Knee flexed in stance - right, Knee flexed in stance - left, Trunk flexed Gait velocity: reduced Gait velocity interpretation: <1.8 ft/sec, indicate of risk for recurrent falls   General Gait Details: Pt looking inferiorly majority of time with flexed trunk, hips, and knees. Cues provided  repeatedly to look superiorly and improve upright posture, mod  momentary success. MinA for stability, +2 for line management.  Stairs            Wheelchair Mobility    Modified Rankin (Stroke Patients Only) Modified Rankin (Stroke Patients Only) Pre-Morbid Rankin Score: No symptoms Modified Rankin: Moderately severe disability     Balance Overall balance assessment: Needs assistance Sitting-balance support: No upper extremity supported, Feet supported Sitting balance-Leahy Scale: Fair     Standing balance support: Bilateral upper extremity supported, During functional activity, Reliant on assistive device for balance Standing balance-Leahy Scale: Poor Standing balance comment: Reliant on UE support and minA.                             Pertinent Vitals/Pain Pain Assessment Pain Assessment: Faces Faces Pain Scale: Hurts little more Pain Location: sternum, surgical location, scrotum and penis with noted edema Pain Descriptors / Indicators: Discomfort, Grimacing, Operative site guarding Pain Intervention(s): Limited activity within patient's tolerance, Monitored during session, Repositioned    Home Living Family/patient expects to be discharged to:: Private residence Living Arrangements: Spouse/significant other;Children (daughter) Available Help at Discharge: Family;Available 24 hours/day Type of Home: House Home Access: Ramped entrance       Home Layout: One level Home Equipment: Grab bars - tub/shower;Grab bars - toilet;Shower seat Additional Comments: Not on O2 at home. Wife uses home O2 and a RW. Daughter also lives with them and does not work.    Prior Function Prior Level of Function : Independent/Modified Independent             Mobility Comments: Denies any recent falls or using an AD at baseline.       Hand Dominance        Extremity/Trunk Assessment   Upper Extremity Assessment Upper Extremity Assessment: Defer to OT evaluation     Lower Extremity Assessment Lower Extremity Assessment: Generalized weakness (functionally: MMT scores of 4 to 4+ grossly bil; sensation intact bil)    Cervical / Trunk Assessment Cervical / Trunk Assessment: Kyphotic  Communication      Cognition Arousal/Alertness: Awake/alert Behavior During Therapy: WFL for tasks assessed/performed Overall Cognitive Status: Impaired/Different from baseline Area of Impairment: Attention, Memory, Problem solving, Following commands                   Current Attention Level: Sustained Memory: Decreased short-term memory Following Commands: Follows multi-step commands inconsistently, Follows one step commands with increased time     Problem Solving: Slow processing, Difficulty sequencing, Requires verbal cues General Comments: Pt A&O but is slow to process info or cues, needing repetition and extra time to follow directions. Pt perseverating on nausea but wanting to take bite of apple sauce with meds, eventually had to ask pt to wait until he returned to the room, RN approved, as pt would get distracted then start over on trying to take bite but never take it. Able to find room with cues on room number and to use signs.        General Comments General comments (skin integrity, edema, etc.): VSS on 5.5-6L O2 via [place] throughout; noted scrotum and penis with significant edema, notified RN who notified MD, provided wash cloths under scrotum to assist in elevating them while sitting in recliner; provided handouts on CABG exercises and precautions, will need to review at a later time; educated pt to have someone assist him 24/7 initially upon d/c; attempted to call wife 2x, but unsuccessful, either to voicemail or answered  but then lost the call or something as it went quiet on other end, will need to confirm amount of available assist at home    Exercises     Assessment/Plan    PT Assessment Patient needs continued PT services  PT Problem List  Decreased strength;Decreased activity tolerance;Decreased balance;Decreased mobility;Decreased cognition;Decreased knowledge of use of DME;Decreased knowledge of precautions;Cardiopulmonary status limiting activity;Pain;Decreased range of motion       PT Treatment Interventions DME instruction;Gait training;Functional mobility training;Therapeutic activities;Therapeutic exercise;Balance training;Neuromuscular re-education;Cognitive remediation;Patient/family education    PT Goals (Current goals can be found in the Care Plan section)  Acute Rehab PT Goals Patient Stated Goal: to go home PT Goal Formulation: With patient Time For Goal Achievement: 08/18/21 Potential to Achieve Goals: Good    Frequency Min 3X/week     Co-evaluation               AM-PAC PT "6 Clicks" Mobility  Outcome Measure Help needed turning from your back to your side while in a flat bed without using bedrails?: A Little Help needed moving from lying on your back to sitting on the side of a flat bed without using bedrails?: A Little Help needed moving to and from a bed to a chair (including a wheelchair)?: A Little Help needed standing up from a chair using your arms (e.g., wheelchair or bedside chair)?: A Lot Help needed to walk in hospital room?: A Little Help needed climbing 3-5 steps with a railing? : A Lot 6 Click Score: 16    End of Session Equipment Utilized During Treatment: Oxygen Activity Tolerance: Patient tolerated treatment well;Other (comment) (limited by nausea) Patient left: in chair;with call bell/phone within reach Nurse Communication: Mobility status;Other (comment) (scrotum and penis edema) PT Visit Diagnosis: Unsteadiness on feet (R26.81);Other abnormalities of gait and mobility (R26.89);Muscle weakness (generalized) (M62.81);Difficulty in walking, not elsewhere classified (R26.2);Pain Pain - part of body:  (sternum)    Time: 3151-7616 PT Time Calculation (min) (ACUTE ONLY): 32  min   Charges:   PT Evaluation $PT Eval Moderate Complexity: 1 Mod PT Treatments $Gait Training: 8-22 mins        Moishe Spice, PT, DPT Acute Rehabilitation Services  Pager: [phone] Office: [phone]   Orvan Falconer 08/04/2021, 2:22 PM

## 2021-08-04 NOTE — Progress Notes (Signed)
Sitting and feeling well. Atrial paced. No A. fib.

## 2021-08-05 ENCOUNTER — Inpatient Hospital Stay (HOSPITAL_COMMUNITY): Payer: Medicare PPO

## 2021-08-05 LAB — GLUCOSE, CAPILLARY
Glucose-Capillary: 199 mg/dL — ABNORMAL HIGH (ref 70–99)
Glucose-Capillary: 95 mg/dL (ref 70–99)
Glucose-Capillary: 94 mg/dL (ref 70–99)
Glucose-Capillary: 109 mg/dL — ABNORMAL HIGH (ref 70–99)
Glucose-Capillary: 97 mg/dL (ref 70–99)
Glucose-Capillary: 162 mg/dL — ABNORMAL HIGH (ref 70–99)

## 2021-08-05 LAB — BASIC METABOLIC PANEL
Anion gap: 5 (ref 5–15)
BUN: 47 mg/dL — ABNORMAL HIGH (ref 8–23)
CO2: 28 mmol/L (ref 22–32)
Calcium: 8.2 mg/dL — ABNORMAL LOW (ref 8.9–10.3)
Chloride: 103 mmol/L (ref 98–111)
Creatinine, Ser: 1.49 mg/dL — ABNORMAL HIGH (ref 0.61–1.24)
GFR, Estimated: 46 mL/min — ABNORMAL LOW (ref >60)
Glucose, Bld: 127 mg/dL — ABNORMAL HIGH (ref 70–99)
Potassium: 4 mmol/L (ref 3.5–5.1)
Sodium: 136 mmol/L (ref 135–145)

## 2021-08-05 MED ORDER — POTASSIUM CHLORIDE CRYS ER 20 MEQ PO TBCR
20.0000 meq | EXTENDED_RELEASE_TABLET | Freq: Two times a day (BID) | ORAL | Status: AC
  Administered 2021-08-05 (×2): 20 meq via ORAL
  Filled 2021-08-05 (×2): qty 1

## 2021-08-05 MED ORDER — FUROSEMIDE 10 MG/ML IJ SOLN
40.0000 mg | Freq: Two times a day (BID) | INTRAMUSCULAR | Status: AC
  Administered 2021-08-05 (×2): 40 mg via INTRAVENOUS
  Filled 2021-08-05 (×2): qty 4

## 2021-08-05 NOTE — Progress Notes (Signed)
CT Surgery  Stable on low dose dopamine for preop RV infarct Creat 1.4 Urine 1.5 L  Vitals:   08/05/21 1900 08/05/21 2000  BP:  109/89  Pulse: 90 84  Resp: 17 20  Temp: 98 F (36.7 C)   SpO2: 95% 94%

## 2021-08-05 NOTE — Progress Notes (Signed)
Physical Therapy Treatment Patient Details Name: Danny [name] MRN: [id number] DOB: [birth year] Today's Date: 08/05/2021   History of Present Illness Pt is an 88 y.o. male who presented 07/27/21 after being found unresponsive and shaking. Pt admitted with NSTEMI. S/p cardiac cath 1/18 which revealed triple-vessel disease. 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 MRI of brain negative. PMH: diverticulitis, HTN, HLD    PT Comments    Pt admitted with above diagnosis. Pt was able to ambulate with RW with min assist overall due to pts need for cues for cognition as well as steadying assist for stability. Pt will need 24 hour care initially.  Pts gait was improved today with pt steadier than last visit overall. Pt progressing.  Pt currently with functional limitations due to balance and endurance deficits. Pt will benefit from skilled PT to increase their independence and safety with mobility to allow discharge to the venue listed below.      Recommendations for follow up therapy are one component of a multi-disciplinary discharge planning process, led by the attending physician.  Recommendations may be updated based on patient status, additional functional criteria and insurance authorization.  Follow Up Recommendations  Acute inpatient rehab (3hours/day)     Assistance Recommended at Discharge Frequent or constant Supervision/Assistance  Patient can return home with the following A little help with walking and/or transfers;A little help with bathing/dressing/bathroom;Assistance with cooking/housework;Direct supervision/assist for medications management;Direct supervision/assist for financial management;Assist for transportation;Help with stairs or ramp for entrance   Equipment Recommendations  Rolling walker (2 wheels)    Recommendations for Other Services Rehab consult     Precautions / Restrictions Precautions Precautions: Fall;Other (comment);Sternal Precaution Booklet Issued: Yes (comment) Precaution  Comments: monitor SpO2; temp pacer; reviewed sternal precautions handout Restrictions Other Position/Activity Restrictions: sternal precautions     Mobility  Bed Mobility               General bed mobility comments: Up in chair upon arrival.    Transfers Overall transfer level: Needs assistance Equipment used: Rolling walker (2 wheels) Transfers: Sit to/from Stand Sit to Stand: Min assist           General transfer comment: Mod assist to scoot pt to edge of chair with pad.  Min A for power up and weight shift forward as well as to steady in static stance when cleaning. Pt pivoted first to 3N1 and had BM and used urinal to urinate in.    Ambulation/Gait Ambulation/Gait assistance: Min assist Gait Distance (Feet): 80 Feet Assistive device: Rolling walker (2 wheels) Gait Pattern/deviations: Step-through pattern, Decreased stride length, Knee flexed in stance - right, Knee flexed in stance - left, Trunk flexed Gait velocity: reduced Gait velocity interpretation: <1.8 ft/sec, indicate of risk for recurrent falls   General Gait Details: Pt with slightly flexed trunk, hips, and knees but could improve posture with cues but did not maintain unless constantly cued. MinA for stability, +2 for line management as well as RW management.  Pt limited distance as he has had loose BM's and he didnt want to get too far from room.  Overall he did only need 1 person assist today.   Stairs             Wheelchair Mobility    Modified Rankin (Stroke Patients Only) Modified Rankin (Stroke Patients Only) Pre-Morbid Rankin Score: No symptoms Modified Rankin: Moderately severe disability     Balance Overall balance assessment: Needs assistance Sitting-balance support: No upper extremity supported, Feet supported Sitting balance-Leahy Scale: Fair  Standing balance support: Bilateral upper extremity supported, During functional activity, Reliant on assistive device for  balance Standing balance-Leahy Scale: Poor Standing balance comment: Reliant on UE support and minA.                            Cognition Arousal/Alertness: Awake/alert Behavior During Therapy: WFL for tasks assessed/performed Overall Cognitive Status: Impaired/Different from baseline Area of Impairment: Attention, Memory, Problem solving, Following commands                   Current Attention Level: Sustained Memory: Decreased short-term memory Following Commands: Follows multi-step commands inconsistently, Follows one step commands with increased time     Problem Solving: Slow processing, Difficulty sequencing, Requires verbal cues General Comments: Pt A&O but is slow to process info or cues, needing repetition and extra time to follow directions. Difficulty sequencing while using urinal in sitting        Exercises General Exercises - Lower Extremity Ankle Circles/Pumps: AROM, Both, 10 reps, Seated Long Arc Quad: AROM, Both, 10 reps, Seated Hip Flexion/Marching: AROM, Both, 10 reps, Standing Other Exercises Other Exercises: IS pulled 1000 ml; x3    General Comments General comments (skin integrity, edema, etc.): VSS on RA      Pertinent Vitals/Pain Pain Assessment Pain Assessment: Faces Faces Pain Scale: Hurts little more Pain Location: sternum, surgical location, scrotum and penis with noted edema Pain Descriptors / Indicators: Discomfort, Grimacing, Operative site guarding Pain Intervention(s): Limited activity within patient's tolerance, Monitored during session, Repositioned, Premedicated before session    Home Living                          Prior Function            PT Goals (current goals can now be found in the care plan section) Acute Rehab PT Goals Patient Stated Goal: to go home Progress towards PT goals: Progressing toward goals    Frequency    Min 3X/week      PT Plan Current plan remains appropriate     Co-evaluation              AM-PAC PT "6 Clicks" Mobility   Outcome Measure  Help needed turning from your back to your side while in a flat bed without using bedrails?: A Little Help needed moving from lying on your back to sitting on the side of a flat bed without using bedrails?: A Little Help needed moving to and from a bed to a chair (including a wheelchair)?: A Little Help needed standing up from a chair using your arms (e.g., wheelchair or bedside chair)?: A Lot Help needed to walk in hospital room?: A Little Help needed climbing 3-5 steps with a railing? : A Lot 6 Click Score: 16    End of Session Equipment Utilized During Treatment: Gait belt Activity Tolerance: Patient tolerated treatment well Patient left: in chair;with call bell/phone within reach;with chair alarm set Nurse Communication: Mobility status;Other (comment) (scrotum and penis edema) PT Visit Diagnosis: Unsteadiness on feet (R26.81);Other abnormalities of gait and mobility (R26.89);Muscle weakness (generalized) (M62.81);Difficulty in walking, not elsewhere classified (R26.2);Pain Pain - part of body:  (sternum)     Time: 4270-6237 PT Time Calculation (min) (ACUTE ONLY): 24 min  Charges:  $Gait Training: 8-22 mins $Therapeutic Exercise: 8-22 mins  [facility] M,PT Acute Rehab Services [phone] (pager)    Alvira Philips 08/05/2021, 1:09 PM

## 2021-08-05 NOTE — Progress Notes (Signed)
Sitting at bedside.  Napping off and on. Normal sinus rhythm. Weaning vasopressor therapy which is being used for RV support. No atrial fib.

## 2021-08-05 NOTE — Progress Notes (Signed)
Inpatient Rehab Admissions Coordinator:   Per therapy recommendations pt was screened for CIR candidacy by Shann Medal, PT, DPT.  At this time, pt appears to meet our criteria for candidacy so I will place an order for full assessment.  Please note that prior Josem Kaufmann is required for CIR and [facility] Medicare may not approve admission.   Shann Medal, PT, DPT Admissions Coordinator [phone] 08/05/21  8:45 AM

## 2021-08-05 NOTE — Progress Notes (Signed)
4 Days Post-Op Procedure(s) (LRB): CORONARY ARTERY BYPASS GRAFTING (CABG) X 3  ,ON PUMP, USING LEFT AND RIGHT INTERNAL MAMMARY ARTERIES, RIGHT AND LEFT ENDOSCOPIC GREATER SAPHENOUS VEIN CONDUITS (N/A) TRANSESOPHAGEAL ECHOCARDIOGRAM (TEE) (N/A) APPLICATION OF CELL SAVER ENDOVEIN HARVEST OF GREATER SAPHENOUS VEIN (Right) Subjective: Feels better today. Slept, walked, had BM. Eating breakfast.  Objective: Vital signs in last 24 hours: Temp:  [98 F (36.7 C)-98.8 F (37.1 C)] 98.5 F (36.9 C) (01/27 0400) Pulse Rate:  [88-97] 89 (01/27 0700) Cardiac Rhythm: Atrial paced;Normal sinus rhythm (01/27 0700) Resp:  [9-20] 17 (01/27 0700) BP: (91-134)/(59-84) 124/79 (01/27 0700) SpO2:  [73 %-100 %] 94 % (01/27 0700) Weight:  [76.1 kg-76.9 kg] 76.1 kg (01/27 0500)  Hemodynamic parameters for last 24 hours:    Intake/Output from previous day: 01/26 0701 - 01/27 0700 In: 853.3 [P.O.:740; I.V.:113.3] Out: 7628 [Urine:3140] Intake/Output this shift: No intake/output data recorded.  General appearance: alert and cooperative Neurologic: intact Heart: regular rate and rhythm Lungs: clear to auscultation bilaterally Extremities: edema mild Wound: incision healing well.  Lab Results: Recent Labs    08/03/21 0430 08/04/21 0451  WBC 9.4 9.2  HGB 9.5* 10.2*  HCT 28.6* 30.7*  PLT 134* 166   BMET:  Recent Labs    08/04/21 0451 08/05/21 0307  NA 136 136  K 4.5 4.0  CL 104 103  CO2 25 28  GLUCOSE 105* 127*  BUN 49* 47*  CREATININE 1.60* 1.49*  CALCIUM 8.4* 8.2*    PT/INR: No results for input(s): LABPROT, INR in the last 72 hours. ABG    Component Value Date/Time   PHART 7.332 (L) 08/01/2021 2054   HCO3 23.2 08/01/2021 2054   TCO2 24 08/01/2021 2054   ACIDBASEDEF 3.0 (H) 08/01/2021 2054   O2SAT 59.0 08/04/2021 0451   CBG (last 3)  Recent Labs    08/05/21 0311 08/05/21 0312 08/05/21 0616  GLUCAP 199* 95 94   CXR: improved aeration and pleural effusions  resolving.  Assessment/Plan: S/P Procedure(s) (LRB): CORONARY ARTERY BYPASS GRAFTING (CABG) X 3  ,ON PUMP, USING LEFT AND RIGHT INTERNAL MAMMARY ARTERIES, RIGHT AND LEFT ENDOSCOPIC GREATER SAPHENOUS VEIN CONDUITS (N/A) TRANSESOPHAGEAL ECHOCARDIOGRAM (TEE) (N/A) APPLICATION OF CELL SAVER ENDOVEIN HARVEST OF GREATER SAPHENOUS VEIN (Right)  POD 4  Hemodynamically stable in sinus rhythm. Have held off on beta blocker due to preop RV infarct and postop vasopressor need. He is on dop 3 mcg for RV support and renal perfusion. Will decrease to 2 mcg today since creat improving and plan to stop it tomorrow and remove sleeve. LVEF normal. Can resume beta blocker once off dopamine.  Acute kidney injury due to preop and periop hypotension. Creat is improving now and diuresed -2200 cc yesterday. Will give lasix 40 bid today and replace K+. Wt is about 12 lbs over preop.  Roll and tape pacer wires.  Continue IS, ambulation.   LOS: 9 days    Gaye Pollack 08/05/2021

## 2021-08-06 ENCOUNTER — Inpatient Hospital Stay (HOSPITAL_COMMUNITY): Payer: Medicare PPO

## 2021-08-06 DIAGNOSIS — I214 Non-ST elevation (NSTEMI) myocardial infarction: Secondary | ICD-10-CM | POA: Diagnosis not present

## 2021-08-06 LAB — BASIC METABOLIC PANEL
Anion gap: 7 (ref 5–15)
BUN: 45 mg/dL — ABNORMAL HIGH (ref 8–23)
CO2: 28 mmol/L (ref 22–32)
Calcium: 8.4 mg/dL — ABNORMAL LOW (ref 8.9–10.3)
Chloride: 102 mmol/L (ref 98–111)
Creatinine, Ser: 1.48 mg/dL — ABNORMAL HIGH (ref 0.61–1.24)
GFR, Estimated: 46 mL/min — ABNORMAL LOW (ref >60)
Glucose, Bld: 111 mg/dL — ABNORMAL HIGH (ref 70–99)
Potassium: 4.4 mmol/L (ref 3.5–5.1)
Sodium: 137 mmol/L (ref 135–145)

## 2021-08-06 LAB — GLUCOSE, CAPILLARY
Glucose-Capillary: 102 mg/dL — ABNORMAL HIGH (ref 70–99)
Glucose-Capillary: 105 mg/dL — ABNORMAL HIGH (ref 70–99)
Glucose-Capillary: 109 mg/dL — ABNORMAL HIGH (ref 70–99)
Glucose-Capillary: 128 mg/dL — ABNORMAL HIGH (ref 70–99)
Glucose-Capillary: 149 mg/dL — ABNORMAL HIGH (ref 70–99)
Glucose-Capillary: 62 mg/dL — ABNORMAL LOW (ref 70–99)

## 2021-08-06 MED ORDER — FUROSEMIDE 10 MG/ML IJ SOLN
40.0000 mg | Freq: Two times a day (BID) | INTRAMUSCULAR | Status: AC
  Administered 2021-08-06 – 2021-08-07 (×2): 40 mg via INTRAVENOUS
  Filled 2021-08-06 (×2): qty 4

## 2021-08-06 MED ORDER — MELATONIN 3 MG PO TABS
3.0000 mg | ORAL_TABLET | Freq: Every day | ORAL | Status: DC
  Administered 2021-08-06 – 2021-08-09 (×4): 3 mg via ORAL
  Filled 2021-08-06 (×4): qty 1

## 2021-08-06 NOTE — Progress Notes (Signed)
IVT consulted for difficult PIV placement/assessment.  One IVT RN assessed pt and needed a second consult.  IVT arrived and pt in chair eating. RN to place new consult when pt is back in bed and ready for assessment.

## 2021-08-06 NOTE — Progress Notes (Signed)
5 Days Post-Op Procedure(s) (LRB): CORONARY ARTERY BYPASS GRAFTING (CABG) X 3  ,ON PUMP, USING LEFT AND RIGHT INTERNAL MAMMARY ARTERIES, RIGHT AND LEFT ENDOSCOPIC GREATER SAPHENOUS VEIN CONDUITS (N/A) TRANSESOPHAGEAL ECHOCARDIOGRAM (TEE) (N/A) APPLICATION OF CELL SAVER ENDOVEIN HARVEST OF GREATER SAPHENOUS VEIN (Right) Subjective: Weaned off dopamine Nsr Creat stable 1.4 Walked in hall Ready for stepdown  Objective: Vital signs in last 24 hours: Temp:  [97.6 F (36.4 C)-98.5 F (36.9 C)] 98.3 F (36.8 C) (01/28 0818) Pulse Rate:  [76-94] 89 (01/28 1000) Cardiac Rhythm: Normal sinus rhythm (01/28 0800) Resp:  [11-22] 17 (01/28 1000) BP: (91-129)/(58-89) 111/70 (01/28 1000) SpO2:  [93 %-99 %] 98 % (01/28 1000) Weight:  [74.3 kg] 74.3 kg (01/28 0500)  Hemodynamic parameters for last 24 hours:    Intake/Output from previous day: 01/27 0701 - 01/28 0700 In: 912.6 [P.O.:840; I.V.:72.6] Out: 2450 [Urine:2450] Intake/Output this shift: Total I/O In: 248.9 [P.O.:240; I.V.:8.9] Out: 200 [Urine:200]       Exam    General- alert and comfortable    Neck- no JVD, no cervical adenopathy palpable, no carotid bruit   Lungs- clear without rales, wheezes   Cor- regular rate and rhythm, no murmur , gallop   Abdomen- soft, non-tender   Extremities - warm, non-tender, minimal edema   Neuro- oriented, appropriate, no focal weakness   Lab Results: Recent Labs    08/04/21 0451  WBC 9.2  HGB 10.2*  HCT 30.7*  PLT 166   BMET:  Recent Labs    08/05/21 0307 08/06/21 0244  NA 136 137  K 4.0 4.4  CL 103 102  CO2 28 28  GLUCOSE 127* 111*  BUN 47* 45*  CREATININE 1.49* 1.48*  CALCIUM 8.2* 8.4*    PT/INR: No results for input(s): LABPROT, INR in the last 72 hours. ABG    Component Value Date/Time   PHART 7.332 (L) 08/01/2021 2054   HCO3 23.2 08/01/2021 2054   TCO2 24 08/01/2021 2054   ACIDBASEDEF 3.0 (H) 08/01/2021 2054   O2SAT 59.0 08/04/2021 0451   CBG (last 3)   Recent Labs    08/06/21 0332 08/06/21 0700 08/06/21 0810  GLUCAP 105* 62* 128*    Assessment/Plan: S/P Procedure(s) (LRB): CORONARY ARTERY BYPASS GRAFTING (CABG) X 3  ,ON PUMP, USING LEFT AND RIGHT INTERNAL MAMMARY ARTERIES, RIGHT AND LEFT ENDOSCOPIC GREATER SAPHENOUS VEIN CONDUITS (N/A) TRANSESOPHAGEAL ECHOCARDIOGRAM (TEE) (N/A) APPLICATION OF CELL SAVER ENDOVEIN HARVEST OF GREATER SAPHENOUS VEIN (Right) Needs cont diuresis with leg edema- cont iv dosing lasix Tx to 4E  LOS: 10 days    Dahlia Byes 08/06/2021

## 2021-08-06 NOTE — Progress Notes (Signed)
Inpatient Rehab Admissions:  Inpatient Rehab Consult received.  I met with patient at the bedside for rehabilitation assessment and to discuss goals and expectations of an inpatient rehab admission.  Pt acknowledged understanding. Pt told AC he would like to go home and receive HH. TOC made aware.  Signed: Gayland Curry, [place], [place] Admissions Coordinator [phone]

## 2021-08-06 NOTE — Progress Notes (Signed)
Patient transferred to 4E room 10 by wheelchair on tele with this RN. Alert with no distress noted. Frederico Hamman, RN at bedside.

## 2021-08-06 NOTE — Progress Notes (Signed)
Blood glucose rechecked after eating breakfast and 128.

## 2021-08-06 NOTE — Progress Notes (Signed)
Progress Note  Patient Name: Danny [name] Date of Encounter: 08/06/2021  [facility] Cardiologist: Mertie Moores, MD   Subjective   Having some mild tenderness below his left clavicle, somewhat tender to palpation but no instability noted.  No ecchymosis no rash.  Sitting up in chair, thankful.  Inpatient Medications    Scheduled Meds:  acetaminophen  1,000 mg Oral Q6H   Or   acetaminophen (TYLENOL) oral liquid 160 mg/5 mL  1,000 mg Per Tube Q6H   aspirin EC  325 mg Oral Daily   Or   aspirin  324 mg Per Tube Daily   bisacodyl  10 mg Oral Daily   Or   bisacodyl  10 mg Rectal Daily   Chlorhexidine Gluconate Cloth  6 each Topical Daily   docusate sodium  200 mg Oral Daily   enoxaparin (LOVENOX) injection  40 mg Subcutaneous QHS   mouth rinse  15 mL Mouth Rinse BID   pantoprazole  40 mg Oral Daily   rosuvastatin  20 mg Oral Daily   sodium chloride flush  10-40 mL Intracatheter Q12H   sodium chloride flush  3 mL Intravenous Q12H   Continuous Infusions:  DOPamine 2 mcg/kg/min (08/06/21 0700)   lactated ringers Stopped (08/03/21 0549)   PRN Meds: ondansetron (ZOFRAN) IV, oxyCODONE, sodium chloride flush, sodium chloride flush, traMADol   Vital Signs    Vitals:   08/06/21 0700 08/06/21 0800 08/06/21 0818 08/06/21 0900  BP: 124/70 (!) 106/58  123/83  Pulse: 89 87  94  Resp: 18 14  (!) 21  Temp:   98.3 F (36.8 C)   TempSrc:   Oral   SpO2: 97% 99%  96%  Weight:      Height:        Intake/Output Summary (Last 24 hours) at 08/06/2021 0906 Last data filed at 08/06/2021 0700 Gross per 24 hour  Intake 664.42 ml  Output 2450 ml  Net -1785.58 ml   Last 3 Weights 08/06/2021 08/05/2021 08/04/2021  Weight (lbs) 163 lb 11.2 oz 167 lb 12.3 oz 169 lb 8.5 oz  Weight (kg) 74.254 kg 76.1 kg 76.9 kg      Telemetry    Sinus rhythm, no ventricular tachycardia no atrial fibrillation- Personally Reviewed  ECG    No new- Personally Reviewed  Physical Exam   GEN: No  acute distress.  Elderly Neck: No JVD Cardiac: RRR, no murmurs, rubs, or gallops.  CABG scar noted Respiratory: Clear to auscultation bilaterally. GI: Soft, nontender, non-distended  MS: 2+ lower extremity edema; No deformity. Neuro:  Nonfocal  Psych: Normal affect   Labs    High Sensitivity Troponin:   Recent Labs  Lab 07/27/21 0025 07/27/21 0225 07/27/21 0530 07/27/21 0928  TROPONINIHS 9 61* 993* 5,799*     Chemistry Recent Labs  Lab 08/01/21 1946 08/01/21 2054 08/02/21 0504 08/02/21 1716 08/03/21 0430 08/04/21 0451 08/05/21 0307 08/06/21 0244  NA 135   < > 138 139   < > 136 136 137  K 4.1   < > 4.0 4.5   < > 4.5 4.0 4.4  CL 104  --  104 106   < > 104 103 102  CO2 21*  --  19* 23   < > 25 28 28   GLUCOSE 138*  --  158* 95   < > 105* 127* 111*  BUN 30*  --  31* 38*   < > 49* 47* 45*  CREATININE 1.36*  --  1.64* 1.62*   < >  1.60* 1.49* 1.48*  CALCIUM 7.5*  --  7.7* 8.1*   < > 8.4* 8.2* 8.4*  MG 3.1*  --  2.8* 2.7*  --   --   --   --   GFRNONAA 51*  --  41* 41*   < > 42* 46* 46*  ANIONGAP 10  --  15 10   < > 7 5 7    < > = values in this interval not displayed.    Lipids No results for input(s): CHOL, TRIG, HDL, LABVLDL, LDLCALC, CHOLHDL in the last 168 hours.  Hematology Recent Labs  Lab 08/02/21 1716 08/03/21 0430 08/04/21 0451  WBC 9.6 9.4 9.2  RBC 2.94* 2.96* 3.15*  HGB 9.5* 9.5* 10.2*  HCT 27.8* 28.6* 30.7*  MCV 94.6 96.6 97.5  MCH 32.3 32.1 32.4  MCHC 34.2 33.2 33.2  RDW 16.2* 16.2* 15.5  PLT 143* 134* 166   Thyroid No results for input(s): TSH, FREET4 in the last 168 hours.  BNPNo results for input(s): BNP, PROBNP in the last 168 hours.  DDimer No results for input(s): DDIMER in the last 168 hours.   Radiology    DG CHEST PORT 1 VIEW  Result Date: 08/05/2021 CLINICAL DATA:  Status post CABG EXAM: PORTABLE CHEST 1 VIEW COMPARISON:  Yesterday FINDINGS: Right IJ Cordis sheath is unchanged. Prior median sternotomy. Midline trachea. Mild  cardiomegaly. Atherosclerosis in the transverse aorta. No pleural effusion or pneumothorax. No congestive failure. Similar left and decreased right base atelectasis. IMPRESSION: Slightly improved right base aeration with persistent bibasilar atelectasis. Cardiomegaly without congestive failure. Aortic Atherosclerosis (ICD10-I70.0). Electronically Signed   By: Abigail Miyamoto M.D.   On: 08/05/2021 08:35    Cardiac Studies   Echo 07/28/2021:   1. Akinesis of the basal inferior wall with overall preserved LV  function; calcified aortic valve with very mild AS (mean gradient 9 mmHg).   2. Left ventricular ejection fraction, by estimation, is 55 to 60%. The  left ventricle has normal function. The left ventricle demonstrates  regional wall motion abnormalities (see scoring diagram/findings for  description). Left ventricular diastolic  parameters are consistent with Grade I diastolic dysfunction (impaired  relaxation).   3. Right ventricular systolic function is normal. The right ventricular  size is normal. There is normal pulmonary artery systolic pressure.   4. The mitral valve is normal in structure. Trivial mitral valve  regurgitation. No evidence of mitral stenosis.   5. The aortic valve is calcified. Aortic valve regurgitation is mild. No  aortic stenosis is present.   6. Aortic dilatation noted. There is borderline dilatation of the aortic  root, measuring 39 mm.   7. The inferior vena cava is normal in size with greater than 50%  respiratory variability, suggesting right atrial pressure of 3 mmHg.   Patient Profile     88 y.o. male postop CABG x3 with RV infarct postoperative vasopressors needed.  Acute kidney injury.  Assessment & Plan    Postop CABG/RV infarct - On low-dose dopamine for RV impact as well as improved renal flow.  Creatinine slightly improved 1.48. -Holding on beta-blocker -Continuing with aspirin -At some point will benefit from furosemide.  Lower extremity edema  noted.  Hyperlipidemia - On high intensity statin Crestor 20.   For questions or updates, please contact [place] Please consult [email or web address] for contact info under        Signed, Candee Furbish, MD  08/06/2021, 9:06 AM

## 2021-08-06 NOTE — Progress Notes (Addendum)
Report called to Frederico Hamman, RN on 4E. Patient will be moved to room 10 on 4E. Oretha Caprice, RN aware that when patient goes to bed this evening that IV consult needs to be reordered to place PIV and then introducer can be removed. This RN attempted PIV placement and IV team attempted as well but unsuccessful.

## 2021-08-07 DIAGNOSIS — I214 Non-ST elevation (NSTEMI) myocardial infarction: Secondary | ICD-10-CM | POA: Diagnosis not present

## 2021-08-07 LAB — BASIC METABOLIC PANEL
Anion gap: 9 (ref 5–15)
BUN: 44 mg/dL — ABNORMAL HIGH (ref 8–23)
CO2: 27 mmol/L (ref 22–32)
Calcium: 8.1 mg/dL — ABNORMAL LOW (ref 8.9–10.3)
Chloride: 99 mmol/L (ref 98–111)
Creatinine, Ser: 1.4 mg/dL — ABNORMAL HIGH (ref 0.61–1.24)
GFR, Estimated: 49 mL/min — ABNORMAL LOW (ref >60)
Glucose, Bld: 103 mg/dL — ABNORMAL HIGH (ref 70–99)
Potassium: 4 mmol/L (ref 3.5–5.1)
Sodium: 135 mmol/L (ref 135–145)

## 2021-08-07 LAB — CBC
HCT: 29.7 % — ABNORMAL LOW (ref 39.0–52.0)
Hemoglobin: 9.6 g/dL — ABNORMAL LOW (ref 13.0–17.0)
MCH: 31.5 pg (ref 26.0–34.0)
MCHC: 32.3 g/dL (ref 30.0–36.0)
MCV: 97.4 fL (ref 80.0–100.0)
Platelets: 219 10*3/uL (ref 150–400)
RBC: 3.05 MIL/uL — ABNORMAL LOW (ref 4.22–5.81)
RDW: 14.9 % (ref 11.5–15.5)
WBC: 6 10*3/uL (ref 4.0–10.5)
nRBC: 0.3 % — ABNORMAL HIGH (ref 0.0–0.2)

## 2021-08-07 MED ORDER — POTASSIUM CHLORIDE CRYS ER 20 MEQ PO TBCR
40.0000 meq | EXTENDED_RELEASE_TABLET | Freq: Every day | ORAL | Status: DC
  Administered 2021-08-07: 40 meq via ORAL
  Filled 2021-08-07: qty 2

## 2021-08-07 MED ORDER — FUROSEMIDE 10 MG/ML IJ SOLN
40.0000 mg | Freq: Every day | INTRAMUSCULAR | Status: DC
  Administered 2021-08-09 – 2021-08-10 (×2): 40 mg via INTRAVENOUS
  Filled 2021-08-07 (×3): qty 4

## 2021-08-07 MED ORDER — METOLAZONE 5 MG PO TABS
5.0000 mg | ORAL_TABLET | Freq: Every day | ORAL | Status: DC
  Administered 2021-08-08 – 2021-08-10 (×3): 5 mg via ORAL
  Filled 2021-08-07 (×3): qty 1

## 2021-08-07 MED ORDER — POTASSIUM CHLORIDE CRYS ER 20 MEQ PO TBCR
20.0000 meq | EXTENDED_RELEASE_TABLET | Freq: Every day | ORAL | Status: DC
  Administered 2021-08-08 – 2021-08-10 (×3): 20 meq via ORAL
  Filled 2021-08-07 (×3): qty 1

## 2021-08-07 MED ORDER — FUROSEMIDE 10 MG/ML IJ SOLN
40.0000 mg | Freq: Two times a day (BID) | INTRAMUSCULAR | Status: AC
  Administered 2021-08-07: 40 mg via INTRAVENOUS
  Filled 2021-08-07: qty 4

## 2021-08-07 MED ORDER — METOPROLOL TARTRATE 12.5 MG HALF TABLET
12.5000 mg | ORAL_TABLET | Freq: Two times a day (BID) | ORAL | Status: DC
  Administered 2021-08-07 (×2): 12.5 mg via ORAL
  Filled 2021-08-07 (×3): qty 1

## 2021-08-07 NOTE — Progress Notes (Addendum)
° °   °  [address]       [place] [address]             [phone]      6 Days Post-Op Procedure(s) (LRB): CORONARY ARTERY BYPASS GRAFTING (CABG) X 3  ,ON PUMP, USING LEFT AND RIGHT INTERNAL MAMMARY ARTERIES, RIGHT AND LEFT ENDOSCOPIC GREATER SAPHENOUS VEIN CONDUITS (N/A) TRANSESOPHAGEAL ECHOCARDIOGRAM (TEE) (N/A) APPLICATION OF CELL SAVER ENDOVEIN HARVEST OF GREATER SAPHENOUS VEIN (Right)  Subjective:  Patient doing okay.  Continues to complain of swelling in his legs and left arm.  He doesn't wish to go to CIR and would like home health.  Objective: Vital signs in last 24 hours: Temp:  [97.7 F (36.5 C)-98.4 F (36.9 C)] 98.3 F (36.8 C) (01/29 0750) Pulse Rate:  [70-89] 85 (01/29 0849) Cardiac Rhythm: Normal sinus rhythm;Bundle branch block;Other (Comment) (01/28 1905) Resp:  [12-21] 18 (01/29 0849) BP: (93-121)/(55-78) 120/65 (01/29 0849) SpO2:  [93 %-100 %] 95 % (01/29 0750) Weight:  [75.9 kg] 75.9 kg (01/29 0004)  Intake/Output from previous day: 01/28 0701 - 01/29 0700 In: 489.8 [P.O.:480; I.V.:9.8] Out: 1425 [Urine:1425]  General appearance: alert, cooperative, and no distress Heart: regular rate and rhythm Lungs: clear to auscultation bilaterally Abdomen: soft, non-tender; bowel sounds normal; no masses,  no organomegaly Extremities: pitting LE edema, Left arm has edema in upper forearm, no erythema present Wound: clean and dry  Lab Results: Recent Labs    08/07/21 0659  WBC 6.0  HGB 9.6*  HCT 29.7*  PLT 219   BMET:  Recent Labs    08/06/21 0244 08/07/21 0555  NA 137 135  K 4.4 4.0  CL 102 99  CO2 28 27  GLUCOSE 111* 103*  BUN 45* 44*  CREATININE 1.48* 1.40*  CALCIUM 8.4* 8.1*    PT/INR: No results for input(s): LABPROT, INR in the last 72 hours. ABG    Component Value Date/Time   PHART 7.332 (L) 08/01/2021 2054   HCO3 23.2 08/01/2021 2054   TCO2 24 08/01/2021 2054   ACIDBASEDEF 3.0 (H) 08/01/2021 2054   O2SAT 59.0  08/04/2021 0451   CBG (last 3)  Recent Labs    08/06/21 0810 08/06/21 1047 08/06/21 1614  GLUCAP 128* 102* 109*    Assessment/Plan: S/P Procedure(s) (LRB): CORONARY ARTERY BYPASS GRAFTING (CABG) X 3  ,ON PUMP, USING LEFT AND RIGHT INTERNAL MAMMARY ARTERIES, RIGHT AND LEFT ENDOSCOPIC GREATER SAPHENOUS VEIN CONDUITS (N/A) TRANSESOPHAGEAL ECHOCARDIOGRAM (TEE) (N/A) APPLICATION OF CELL SAVER ENDOVEIN HARVEST OF GREATER SAPHENOUS VEIN (Right)  CV- NSR, BP improved- will start low dose BB, remove EPW today Pulm- off oxygen, continue IS Renal- creatinine remains stable at 1.40, weight is elevated about 17 lbs with pitting edema in B LE and Left arm... will repeat IV Lasix, add potassium supplement... TED hose ordered...  CBGs patient is not a diabetic will d/c cbgs Deconditioning- PT recs CIR patient wishes to go home with home health orders placed Dispo- patient hemodynamically stable, will remove EPW today.. biggest issues is volume status with pitting edema.. continue diuretics, once this improves patient will be ready for d/c   LOS: 11 days    Ellwood Handler, PA-C 08/07/2021  Excellent progress on stepdown.  Maintaining sinus rhythm.  Creatinine stable 1.4.  We will add Zaroxolyn to his a.m. Lasix dose to enhance fluid removal.  patient examined and medical record reviewed,agree with above note. Dahlia Byes 08/07/2021

## 2021-08-07 NOTE — Progress Notes (Signed)
Progress Note  Patient Name: Danny [name] Date of Encounter: 08/07/2021  [facility] Cardiologist: Mertie Moores, MD    Subjective    88 yo with recent dx of CAD , now S.p CABG.  Hx of htn, HLD,  Has persistent leg edema following surgery .  He is now off the dopamine drip  Getting IV lasix for his leg edema     Inpatient Medications    Scheduled Meds:  aspirin EC  325 mg Oral Daily   Or   aspirin  324 mg Per Tube Daily   bisacodyl  10 mg Oral Daily   Or   bisacodyl  10 mg Rectal Daily   Chlorhexidine Gluconate Cloth  6 each Topical Daily   docusate sodium  200 mg Oral Daily   furosemide  40 mg Intravenous BID   mouth rinse  15 mL Mouth Rinse BID   melatonin  3 mg Oral QHS   metoprolol tartrate  12.5 mg Oral BID   pantoprazole  40 mg Oral Daily   potassium chloride  40 mEq Oral Daily   rosuvastatin  20 mg Oral Daily   sodium chloride flush  10-40 mL Intracatheter Q12H   sodium chloride flush  3 mL Intravenous Q12H   Continuous Infusions:  PRN Meds: ondansetron (ZOFRAN) IV, oxyCODONE, sodium chloride flush, sodium chloride flush, traMADol   Vital Signs    Vitals:   08/07/21 0004 08/07/21 0452 08/07/21 0750 08/07/21 0849  BP: 113/67 121/67 (!) 111/55 120/65  Pulse: 78 76 75 85  Resp: 20 20 20 18   Temp: 98 F (36.7 C) 98 F (36.7 C) 98.3 F (36.8 C)   TempSrc: Oral Oral Oral Oral  SpO2: 94% 95% 95% 96%  Weight: 75.9 kg     Height:        Intake/Output Summary (Last 24 hours) at 08/07/2021 1116 Last data filed at 08/07/2021 1022 Gross per 24 hour  Intake 240 ml  Output 1525 ml  Net -1285 ml   Last 3 Weights 08/07/2021 08/06/2021 08/05/2021  Weight (lbs) 167 lb 5.3 oz 163 lb 11.2 oz 167 lb 12.3 oz  Weight (kg) 75.9 kg 74.254 kg 76.1 kg      Telemetry    NSR  - Personally Reviewed  ECG     - Personally Reviewed  Physical Exam   GEN: elderly male, NAD   Neck: No JVD. Has a R IJ catheter in place  Cardiac: RRR, no murmurs, rubs, or  gallops. Sternotomy scare  Respiratory: Clear to auscultation bilaterally. GI: Soft, nontender, non-distended  MS:2 + pitting edema . Neuro:  Nonfocal  Psych: Normal affect   Labs    High Sensitivity Troponin:   Recent Labs  Lab 07/27/21 0025 07/27/21 0225 07/27/21 0530 07/27/21 0928  TROPONINIHS 9 61* 993* 5,799*     Chemistry Recent Labs  Lab 08/01/21 1946 08/01/21 2054 08/02/21 0504 08/02/21 1716 08/03/21 0430 08/05/21 0307 08/06/21 0244 08/07/21 0555  NA 135   < > 138 139   < > 136 137 135  K 4.1   < > 4.0 4.5   < > 4.0 4.4 4.0  CL 104  --  104 106   < > 103 102 99  CO2 21*  --  19* 23   < > 28 28 27   GLUCOSE 138*  --  158* 95   < > 127* 111* 103*  BUN 30*  --  31* 38*   < > 47* 45* 44*  CREATININE 1.36*  --  1.64* 1.62*   < > 1.49* 1.48* 1.40*  CALCIUM 7.5*  --  7.7* 8.1*   < > 8.2* 8.4* 8.1*  MG 3.1*  --  2.8* 2.7*  --   --   --   --   GFRNONAA 51*  --  41* 41*   < > 46* 46* 49*  ANIONGAP 10  --  15 10   < > 5 7 9    < > = values in this interval not displayed.    Lipids No results for input(s): CHOL, TRIG, HDL, LABVLDL, LDLCALC, CHOLHDL in the last 168 hours.  Hematology Recent Labs  Lab 08/03/21 0430 08/04/21 0451 08/07/21 0659  WBC 9.4 9.2 6.0  RBC 2.96* 3.15* 3.05*  HGB 9.5* 10.2* 9.6*  HCT 28.6* 30.7* 29.7*  MCV 96.6 97.5 97.4  MCH 32.1 32.4 31.5  MCHC 33.2 33.2 32.3  RDW 16.2* 15.5 14.9  PLT 134* 166 219   Thyroid No results for input(s): TSH, FREET4 in the last 168 hours.  BNPNo results for input(s): BNP, PROBNP in the last 168 hours.  DDimer No results for input(s): DDIMER in the last 168 hours.   Radiology    DG Chest Port 1 View  Result Date: 08/06/2021 CLINICAL DATA:  Coronary bypass surgery EXAM: PORTABLE CHEST 1 VIEW COMPARISON:  Previous studies including the examination of 08/05/2021 FINDINGS: Transverse diameter of heart is increased. Tip of right IJ central venous catheter is seen in the superior vena cava. Metallic sutures seen  in the sternum. Increased density in the left lower lung fields suggests pleural effusion and possibly underlying infiltrate. There is faint haziness in the right lower lung fields suggesting layering of small pleural effusion. There is no pneumothorax. IMPRESSION: There are no signs of pulmonary edema. Bilateral pleural effusions, more so on the left side. There is no pneumothorax. Electronically Signed   By: Elmer Picker M.D.   On: 08/06/2021 12:25    Cardiac Studies      Patient Profile     88 y.o. male  with CAD, s/p recent CABG   Assessment & Plan     CAD / CABG:   slow and steady progress.   Still is very volume overloaded.  Agree with lasix. His creatinine is up slightly  - will start daily lasix and follow  Have reduced the kdur to 20 a day  Bmp daily   Agree with leg elevation and compression hose .         For questions or updates, please contact [place] Please consult [email or web address] for contact info under        Signed, Mertie Moores, MD  08/07/2021, 11:16 AM

## 2021-08-07 NOTE — Progress Notes (Signed)
Walked 240 ft with patient on unit Patient stood by himself while I coached him Pt used a front wheel walker Patient tolerated activity very well No complaints Pt sat down in chair watching football and eating dinner with no assistance

## 2021-08-07 NOTE — Progress Notes (Signed)
Mobility Specialist Progress Note:   08/07/21 1122  Mobility  Bed Position Chair  Activity Ambulated with assistance in hallway  Level of Assistance Minimal assist, patient does 75% or more  Assistive Device Front wheel walker  Distance Ambulated (ft) 150 ft  Activity Response Tolerated well  $Mobility charge 1 Mobility   Pt received in chair willing to participate in mobility. No complaints of pain and asymptomatic. Required cues for hand placement and reminders of sternal precautions. Pt left in chair with call bell in reach and all needs met.   [facility] Public librarian Phone [phone] Secondary Phone [phone]

## 2021-08-07 NOTE — Progress Notes (Addendum)
Vitals prior to pacing wires removal  12:06 PM 115/65  BP 81  MAP 75  Heart Rate  12:12 PM 104/60  BP 75   MAP 75   Heart Rate  Vitals after pacing wires removed  12:15 PM 109/59  BP 75  MAP 70    Heart Rate  12:30 PM 103/53  BP 68  MAP 67  Heart Rate  12:45 PM 100/60  BP 74  MAP 70  Heart Rate  13:00 PM 99/61  BP 72  MAP 68  Heart Rate  Vitals this evening were  16:15 PM 109/69  BP 80  MAP 82  Heart Rate

## 2021-08-08 ENCOUNTER — Inpatient Hospital Stay (HOSPITAL_COMMUNITY): Payer: Medicare PPO

## 2021-08-08 DIAGNOSIS — Z951 Presence of aortocoronary bypass graft: Secondary | ICD-10-CM | POA: Diagnosis not present

## 2021-08-08 DIAGNOSIS — M7989 Other specified soft tissue disorders: Secondary | ICD-10-CM

## 2021-08-08 DIAGNOSIS — I214 Non-ST elevation (NSTEMI) myocardial infarction: Secondary | ICD-10-CM | POA: Diagnosis not present

## 2021-08-08 DIAGNOSIS — N1831 Chronic kidney disease, stage 3a: Secondary | ICD-10-CM | POA: Diagnosis not present

## 2021-08-08 LAB — BASIC METABOLIC PANEL
Anion gap: 11 (ref 5–15)
BUN: 46 mg/dL — ABNORMAL HIGH (ref 8–23)
CO2: 25 mmol/L (ref 22–32)
Calcium: 8.4 mg/dL — ABNORMAL LOW (ref 8.9–10.3)
Chloride: 100 mmol/L (ref 98–111)
Creatinine, Ser: 1.49 mg/dL — ABNORMAL HIGH (ref 0.61–1.24)
GFR, Estimated: 46 mL/min — ABNORMAL LOW (ref >60)
Glucose, Bld: 112 mg/dL — ABNORMAL HIGH (ref 70–99)
Potassium: 4.6 mmol/L (ref 3.5–5.1)
Sodium: 136 mmol/L (ref 135–145)

## 2021-08-08 MED ORDER — CEPHALEXIN 500 MG PO CAPS
500.0000 mg | ORAL_CAPSULE | Freq: Three times a day (TID) | ORAL | Status: DC
  Administered 2021-08-08 – 2021-08-10 (×6): 500 mg via ORAL
  Filled 2021-08-08 (×6): qty 1

## 2021-08-08 MED ORDER — METOPROLOL TARTRATE 25 MG PO TABS
25.0000 mg | ORAL_TABLET | Freq: Two times a day (BID) | ORAL | Status: DC
  Administered 2021-08-08 – 2021-08-10 (×5): 25 mg via ORAL
  Filled 2021-08-08 (×5): qty 1

## 2021-08-08 NOTE — Progress Notes (Signed)
Progress Note  Patient Name: Danny [name] Date of Encounter: 08/08/2021  [facility] Cardiologist: Mertie Moores, MD   Subjective   Sitting up in the chair. No complaints.   Inpatient Medications    Scheduled Meds:  aspirin EC  325 mg Oral Daily   Or   aspirin  324 mg Per Tube Daily   bisacodyl  10 mg Oral Daily   Or   bisacodyl  10 mg Rectal Daily   Chlorhexidine Gluconate Cloth  6 each Topical Daily   docusate sodium  200 mg Oral Daily   furosemide  40 mg Intravenous Daily   mouth rinse  15 mL Mouth Rinse BID   melatonin  3 mg Oral QHS   metolazone  5 mg Oral Daily   metoprolol tartrate  12.5 mg Oral BID   pantoprazole  40 mg Oral Daily   potassium chloride  20 mEq Oral Daily   rosuvastatin  20 mg Oral Daily   sodium chloride flush  10-40 mL Intracatheter Q12H   sodium chloride flush  3 mL Intravenous Q12H   Continuous Infusions:  PRN Meds: ondansetron (ZOFRAN) IV, oxyCODONE, sodium chloride flush, sodium chloride flush, traMADol   Vital Signs    Vitals:   08/07/21 2346 08/08/21 0435 08/08/21 0743 08/08/21 0900  BP: (!) 124/58  130/69 113/67  Pulse: 72  76   Resp: 17  18 18   Temp: 97.7 F (36.5 C)  98.6 F (37 C) 98.2 F (36.8 C)  TempSrc: Oral  Oral Oral  SpO2: 96%  97%   Weight:  75 kg    Height:        Intake/Output Summary (Last 24 hours) at 08/08/2021 0949 Last data filed at 08/07/2021 1833 Gross per 24 hour  Intake 0 ml  Output 1275 ml  Net -1275 ml   Last 3 Weights 08/08/2021 08/07/2021 08/06/2021  Weight (lbs) 165 lb 5.5 oz 167 lb 5.3 oz 163 lb 11.2 oz  Weight (kg) 75 kg 75.9 kg 74.254 kg      Telemetry    SR, PVCs - Personally Reviewed  ECG    No new tracing  Physical Exam   GEN: No acute distress.   Neck: No JVD Cardiac: RRR, no murmurs, rubs, or gallops.  Respiratory: Clear to auscultation bilaterally. GI: Soft, nontender, non-distended  MS: Bilateral 2+ pitting LE edema, left forearm edema; No deformity. Neuro:   Nonfocal  Psych: Normal affect   Labs    High Sensitivity Troponin:   Recent Labs  Lab 07/27/21 0025 07/27/21 0225 07/27/21 0530 07/27/21 0928  TROPONINIHS 9 61* 993* 5,799*     Chemistry Recent Labs  Lab 08/01/21 1946 08/01/21 2054 08/02/21 0504 08/02/21 1716 08/03/21 0430 08/06/21 0244 08/07/21 0555 08/08/21 0117  NA 135   < > 138 139   < > 137 135 136  K 4.1   < > 4.0 4.5   < > 4.4 4.0 4.6  CL 104  --  104 106   < > 102 99 100  CO2 21*  --  19* 23   < > 28 27 25   GLUCOSE 138*  --  158* 95   < > 111* 103* 112*  BUN 30*  --  31* 38*   < > 45* 44* 46*  CREATININE 1.36*  --  1.64* 1.62*   < > 1.48* 1.40* 1.49*  CALCIUM 7.5*  --  7.7* 8.1*   < > 8.4* 8.1* 8.4*  MG 3.1*  --  2.8* 2.7*  --   --   --   --   GFRNONAA 51*  --  41* 41*   < > 46* 49* 46*  ANIONGAP 10  --  15 10   < > 7 9 11    < > = values in this interval not displayed.    Lipids No results for input(s): CHOL, TRIG, HDL, LABVLDL, LDLCALC, CHOLHDL in the last 168 hours.  Hematology Recent Labs  Lab 08/03/21 0430 08/04/21 0451 08/07/21 0659  WBC 9.4 9.2 6.0  RBC 2.96* 3.15* 3.05*  HGB 9.5* 10.2* 9.6*  HCT 28.6* 30.7* 29.7*  MCV 96.6 97.5 97.4  MCH 32.1 32.4 31.5  MCHC 33.2 33.2 32.3  RDW 16.2* 15.5 14.9  PLT 134* 166 219   Thyroid No results for input(s): TSH, FREET4 in the last 168 hours.  BNPNo results for input(s): BNP, PROBNP in the last 168 hours.  DDimer No results for input(s): DDIMER in the last 168 hours.   Radiology    DG Chest Port 1 View  Result Date: 08/06/2021 CLINICAL DATA:  Coronary bypass surgery EXAM: PORTABLE CHEST 1 VIEW COMPARISON:  Previous studies including the examination of 08/05/2021 FINDINGS: Transverse diameter of heart is increased. Tip of right IJ central venous catheter is seen in the superior vena cava. Metallic sutures seen in the sternum. Increased density in the left lower lung fields suggests pleural effusion and possibly underlying infiltrate. There is faint  haziness in the right lower lung fields suggesting layering of small pleural effusion. There is no pneumothorax. IMPRESSION: There are no signs of pulmonary edema. Bilateral pleural effusions, more so on the left side. There is no pneumothorax. Electronically Signed   By: Elmer Picker M.D.   On: 08/06/2021 12:25    Cardiac Studies   Echo 07/28/2021:   1. Akinesis of the basal inferior wall with overall preserved LV  function; calcified aortic valve with very mild AS (mean gradient 9 mmHg).   2. Left ventricular ejection fraction, by estimation, is 55 to 60%. The  left ventricle has normal function. The left ventricle demonstrates  regional wall motion abnormalities (see scoring diagram/findings for  description). Left ventricular diastolic  parameters are consistent with Grade I diastolic dysfunction (impaired  relaxation).   3. Right ventricular systolic function is normal. The right ventricular  size is normal. There is normal pulmonary artery systolic pressure.   4. The mitral valve is normal in structure. Trivial mitral valve  regurgitation. No evidence of mitral stenosis.   5. The aortic valve is calcified. Aortic valve regurgitation is mild. No  aortic stenosis is present.   6. Aortic dilatation noted. There is borderline dilatation of the aortic  root, measuring 39 mm.   7. The inferior vena cava is normal in size with greater than 50%  respiratory variability, suggesting right atrial pressure of 3 mmHg.   Patient Profile     88 y.o. male with PMH of HTN, HLD who presented with chest pain/NSTEMI. Underwent cardiac cath noted above with multivessel CAD, now s/p CABG.   Assessment & Plan    CAD s/p CABG: progressing slowly, but improving. Treated with ASA, statin, BB -- working with CR/PT with recommendations for HHPT at discharge  Post op Volume overload: continues to have LE edema/left forearm edema -- on IV lasix 40mg  daily, ordered to metolazone 5mg  daily now --  follow UOP  HLD: on statin   For questions or updates, please contact [place] Please consult [email or web address] for  contact info under        Signed, Reino Bellis, NP  08/08/2021, 9:49 AM

## 2021-08-08 NOTE — Progress Notes (Signed)
Occupational Therapy Treatment Patient Details Name: Danny [name] MRN: [id number] DOB: [birth year] Today's Date: 08/08/2021   History of present illness 88 y.o. male who presented 07/27/21 after being found unresponsive and shaking. Pt admitted with NSTEMI. S/p cardiac cath 1/18 which revealed triple-vessel disease. 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 MRI of brain negative. PMH: diverticulitis, HTN, HLD   OT comments  Pt progressing towards established OT goals and demonstrating increased activity tolerance and arousal. Continues to require cues throughout for adhering to sternal precautions. Providing education on compensatory techniques for grooming, UB dressing, LB dressing, and toilet hygiene. Pt performing UB and LB dressing with Min A and cues throughout. Update dc to home with HHOT and will continue to follow acutely as admitted.    Recommendations for follow up therapy are one component of a multi-disciplinary discharge planning process, led by the attending physician.  Recommendations may be updated based on patient status, additional functional criteria and insurance authorization.    Follow Up Recommendations  Home health OT    Assistance Recommended at Discharge Intermittent Supervision/Assistance  Patient can return home with the following  Assistance with cooking/housework;A little help with walking and/or transfers;A little help with bathing/dressing/bathroom   Equipment Recommendations  None recommended by OT    Recommendations for Other Services      Precautions / Restrictions Precautions Precautions: Fall;Other (comment);Sternal Precaution Comments: Reviewed sternal precautions Restrictions Other Position/Activity Restrictions: sternal precautions       Mobility Bed Mobility               General bed mobility comments: Sitting in recliner having recently finished with cardiac rehab    Transfers Overall transfer level: Needs assistance Equipment used: None Transfers:  Sit to/from Stand Sit to Stand: Min guard           General transfer comment: Min Guard A ofr safety; cues for hand placement     Balance Overall balance assessment: Needs assistance Sitting-balance support: Feet supported, No upper extremity supported Sitting balance-Leahy Scale: Fair     Standing balance support: During functional activity Standing balance-Leahy Scale: Fair                             ADL either performed or assessed with clinical judgement   ADL Overall ADL's : Needs assistance/impaired       Grooming Details (indicate cue type and reason): Providing education on compensatory for oral care         Upper Body Dressing : Minimal assistance;Sitting Upper Body Dressing Details (indicate cue type and reason): Min A for repositioning shirt and maintaining sternal precautions Lower Body Dressing: Min guard;Sit to/from stand Lower Body Dressing Details (indicate cue type and reason): cues for maintaining sternal precautions while pulling pants over hips. Use of modified figure four for threading pants over feet       Toileting - Clothing Manipulation Details (indicate cue type and reason): Educating pt on compensatory techniques for peri care after BM       General ADL Comments: Focused session on continuing education on compensatory tehcniques for dressing and toileting. Pt requiring cues for adherance to sternal precautions.    Extremity/Trunk Assessment Upper Extremity Assessment Upper Extremity Assessment: Generalized weakness   Lower Extremity Assessment Lower Extremity Assessment: Defer to PT evaluation        Vision       Perception     Praxis      Cognition Arousal/Alertness: Awake/alert Behavior During  Therapy: WFL for tasks assessed/performed Overall Cognitive Status: Impaired/Different from baseline Area of Impairment: Memory, Safety/judgement                     Memory: Decreased recall of precautions    Safety/Judgement: Decreased awareness of safety     General Comments: Continues to require cues for sternal precautions. However, demonstrating increased problem solving compared to prior session as pt engaging in sequencing of dressing activities while attempting ot maintain sternal precautions        Exercises      Shoulder Instructions       General Comments VSS    Pertinent Vitals/ Pain       Pain Assessment Pain Assessment: Faces Faces Pain Scale: Hurts a little bit Pain Location: chest Pain Descriptors / Indicators: Sore, Discomfort Pain Intervention(s): Monitored during session, Repositioned  Home Living                                          Prior Functioning/Environment              Frequency  Min 2X/week        Progress Toward Goals  OT Goals(current goals can now be found in the care plan section)  Progress towards OT goals: Progressing toward goals  Acute Rehab OT Goals OT Goal Formulation: With patient Time For Goal Achievement: 08/18/21 Potential to Achieve Goals: Good ADL Goals Pt Will Perform Grooming: with min guard assist;standing Pt Will Perform Upper Body Dressing: sitting;with set-up;with supervision Pt Will Perform Lower Body Dressing: with min guard assist;sit to/from stand;with adaptive equipment Pt Will Transfer to Toilet: with min guard assist;ambulating;bedside commode Pt Will Perform Toileting - Clothing Manipulation and hygiene: with min guard assist;sitting/lateral leans;sit to/from stand;with adaptive equipment Pt Will Perform Tub/Shower Transfer: Shower transfer;with min guard assist;ambulating;shower seat  Plan Discharge plan needs to be updated    Co-evaluation                 AM-PAC OT "6 Clicks" Daily Activity     Outcome Measure   Help from another person eating meals?: None Help from another person taking care of personal grooming?: A Little Help from another person toileting, which  includes using toliet, bedpan, or urinal?: A Little Help from another person bathing (including washing, rinsing, drying)?: A Little Help from another person to put on and taking off regular upper body clothing?: A Little Help from another person to put on and taking off regular lower body clothing?: A Little 6 Click Score: 19    End of Session    OT Visit Diagnosis: Unsteadiness on feet (R26.81);Other abnormalities of gait and mobility (R26.89);Muscle weakness (generalized) (M62.81)   Activity Tolerance Patient tolerated treatment well   Patient Left in chair;with call bell/phone within reach   Nurse Communication Mobility status        Time: 2683-4196 OT Time Calculation (min): 24 min  Charges: OT General Charges $OT Visit: 1 Visit OT Treatments $Self Care/Home Management : 23-37 mins  [place], OTR/L Acute Rehab Pager: [phone] Office: [place] 08/08/2021, 2:48 PM

## 2021-08-08 NOTE — Care Management Important Message (Signed)
Important Message  Patient Details  Name: Danny [name] MRN: [id number] Date of Birth: 07/23/1933   Medicare Important Message Given:  Yes     Shelda Altes 08/08/2021, 8:04 AM

## 2021-08-08 NOTE — Progress Notes (Signed)
Mobility Specialist: Progress Note   08/08/21 1656  Mobility  Bed Position Chair  Activity Ambulated with assistance in hallway  Level of Assistance Standby assist, set-up cues, supervision of patient - no hands on  Assistive Device Front wheel walker  Distance Ambulated (ft) 370 ft  Activity Response Tolerated well  $Mobility charge 1 Mobility   Pre-Mobility: 80 HR Post-Mobility: 73 HR  Received pt in chair having no complaints and agreeable to mobility. Asymptomatic throughout ambulation, returned back to chair w/ call bell in reach and all needs met.  [facility] Janetta Vandoren Mobility Specialist Mobility Specialist [address] [place]: [phone] Mobility Specialist [address] [place]: [phone]

## 2021-08-08 NOTE — Progress Notes (Signed)
Physical Therapy Treatment Patient Details Name: Danny [name] MRN: [id number] DOB: 12-12-1933 Today's Date: 08/08/2021   History of Present Illness Pt is an 88 y.o. male who presented 07/27/21 after being found unresponsive and shaking. Pt admitted with NSTEMI. S/p cardiac cath 1/18 which revealed triple-vessel disease. 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 MRI of brain negative. PMH: diverticulitis, HTN, HLD    PT Comments    Patient progressing well towards PT goals. Requires Mod A for bed mobility with HOB flat to simulate home and needs constant cues to adhere to sternal precautions for all functional mobility. Improved ambulation distance with min guard assist and use of RW for support. BP pre activity 113/67, post activity BP 137/79, HR stable in 70s-80s, Sp02 >89% on RA. Worked on LE functional strengthening to prepare for transfers and safe d/c home with support of wife. Reviewed sternal precautions in relation to activity. Will follow.    Recommendations for follow up therapy are one component of a multi-disciplinary discharge planning process, led by the attending physician.  Recommendations may be updated based on patient status, additional functional criteria and insurance authorization.  Follow Up Recommendations  Home health PT     Assistance Recommended at Discharge Intermittent Supervision/Assistance  Patient can return home with the following A little help with walking and/or transfers;A little help with bathing/dressing/bathroom;Assistance with cooking/housework;Direct supervision/assist for medications management;Direct supervision/assist for financial management;Assist for transportation;Help with stairs or ramp for entrance   Equipment Recommendations  Rolling walker (2 wheels)    Recommendations for Other Services       Precautions / Restrictions Precautions Precautions: Fall;Other (comment);Sternal Precaution Booklet Issued: Yes (comment) Precaution Comments: Reviewed sternal  precautions Restrictions Weight Bearing Restrictions: Yes Other Position/Activity Restrictions: sternal precautions     Mobility  Bed Mobility Overal bed mobility: Needs Assistance Bed Mobility: Rolling, Sidelying to Sit Rolling: Min guard Sidelying to sit: Mod assist, HOB elevated       General bed mobility comments: CUes for log roll technique holding heart pillow to get to EOB, cues to adhere to sternal precautions, simulate bed ot home with HOB flat and no rails.    Transfers Overall transfer level: Needs assistance Equipment used: Rolling walker (2 wheels) Transfers: Sit to/from Stand Sit to Stand: Min assist, Min guard           General transfer comment: Min A to power to standing from EOB with cues for hands on knees as pt wanting to pull up on RW. Stood from chair x5 for strengthening needing MIn guard for safety.    Ambulation/Gait Ambulation/Gait assistance: Min guard Gait Distance (Feet): 400 Feet Assistive device: Rolling walker (2 wheels) Gait Pattern/deviations: Step-through pattern, Decreased stride length   Gait velocity interpretation: 1.31 - 2.62 ft/sec, indicative of limited community ambulator   General Gait Details: Slow, mostly steady gait with RW for support, cues for upright. SP02 >89% on RA with no DOE noted. 1 standing rest breaks needed or dizziness.   Stairs             Wheelchair Mobility    Modified Rankin (Stroke Patients Only)       Balance Overall balance assessment: Needs assistance Sitting-balance support: Feet supported, No upper extremity supported Sitting balance-Leahy Scale: Fair     Standing balance support: During functional activity Standing balance-Leahy Scale: Fair Standing balance comment: ABle to stand statically without UE support, needs UE support for walking  Cognition Arousal/Alertness: Awake/alert Behavior During Therapy: WFL for tasks  assessed/performed Overall Cognitive Status: No family/caregiver present to determine baseline cognitive functioning Area of Impairment: Memory, Safety/judgement                     Memory: Decreased recall of precautions, Decreased short-term memory   Safety/Judgement: Decreased awareness of safety     General Comments: Needs cues to adhere to sternal precautions during mobility. Follows commands well this session. "if they would just leave the walker there, I could get up on my own."        Exercises Other Exercises Other Exercises: Sit to stand x5 from chair    General Comments General comments (skin integrity, edema, etc.): BP pre activity 113/67, post activity BP 137/79, HR stable in 70s-80s, Sp02 >89% on RA.      Pertinent Vitals/Pain Pain Assessment Pain Assessment: Faces Faces Pain Scale: Hurts a little bit Pain Location: bottom Pain Descriptors / Indicators: Sore, Discomfort Pain Intervention(s): Monitored during session, Repositioned    Home Living                          Prior Function            PT Goals (current goals can now be found in the care plan section) Progress towards PT goals: Progressing toward goals    Frequency    Min 3X/week      PT Plan Discharge plan needs to be updated    Co-evaluation              AM-PAC PT "6 Clicks" Mobility   Outcome Measure  Help needed turning from your back to your side while in a flat bed without using bedrails?: A Little Help needed moving from lying on your back to sitting on the side of a flat bed without using bedrails?: A Lot Help needed moving to and from a bed to a chair (including a wheelchair)?: A Little Help needed standing up from a chair using your arms (e.g., wheelchair or bedside chair)?: A Little Help needed to walk in hospital room?: A Little Help needed climbing 3-5 steps with a railing? : A Lot 6 Click Score: 16    End of Session Equipment Utilized During  Treatment: Gait belt Activity Tolerance: Patient tolerated treatment well Patient left: in chair;with call bell/phone within reach;with nursing/sitter in room Nurse Communication: Mobility status PT Visit Diagnosis: Unsteadiness on feet (R26.81);Other abnormalities of gait and mobility (R26.89);Muscle weakness (generalized) (M62.81);Difficulty in walking, not elsewhere classified (R26.2)     Time: 0981-1914 PT Time Calculation (min) (ACUTE ONLY): 20 min  Charges:  $Therapeutic Activity: 8-22 mins                     Marisa Severin, PT, DPT Acute Rehabilitation Services Pager [phone] Office [phone]      Marguarite Arbour A Sabra Heck 08/08/2021, 9:42 AM

## 2021-08-08 NOTE — Progress Notes (Signed)
CARDIAC REHAB PHASE I   PRE:  Rate/Rhythm: 11 SR with PVC    BP: sitting 100/49    SaO2: 94 RA  MODE:  Ambulation: 400 ft   POST:  Rate/Rhythm: 86 SR    BP: sitting 119/50     SaO2: 94 RA  Pt able to stand and walk to sink, brush teeth, then ambulate hall. Standby assist. No c/o. Anterior lean. To recliner on pillow due to sore buttocks. Doing well, OT in to see. [place], ACSM 08/08/2021 2:20 PM

## 2021-08-08 NOTE — Progress Notes (Addendum)
[address] Center[address]       [place] [address]             [phone]      7 Days Post-Op Procedure(s) (LRB): CORONARY ARTERY BYPASS GRAFTING (CABG) X 3  ,ON PUMP, USING LEFT AND RIGHT INTERNAL MAMMARY ARTERIES, RIGHT AND LEFT ENDOSCOPIC GREATER SAPHENOUS VEIN CONDUITS (N/A) TRANSESOPHAGEAL ECHOCARDIOGRAM (TEE) (N/A) APPLICATION OF CELL SAVER ENDOVEIN HARVEST OF GREATER SAPHENOUS VEIN (Right) Subjective: Conts to feel better , stronger  Objective: Vital signs in last 24 hours: Temp:  [97.7 F (36.5 C)-98.4 F (36.9 C)] 97.7 F (36.5 C) (01/29 2346) Pulse Rate:  [66-85] 72 (01/29 2346) Cardiac Rhythm: Normal sinus rhythm (01/29 1906) Resp:  [16-20] 17 (01/29 2346) BP: (99-126)/(53-80) 124/58 (01/29 2346) SpO2:  [91 %-98 %] 96 % (01/29 2346) Weight:  [75 kg] 75 kg (01/30 0435)  Hemodynamic parameters for last 24 hours:    Intake/Output from previous day: 01/29 0701 - 01/30 0700 In: 0  Out: 1275 [Urine:1275] Intake/Output this shift: No intake/output data recorded.  General appearance: alert, cooperative, and no distress Heart: frequent extrasystoles Lungs: clear to auscultation bilaterally Abdomen: benign Extremities: + pitting LE edema Wound: healing well Left forearm with edema/swelling   Lab Results: Recent Labs    08/07/21 0659  WBC 6.0  HGB 9.6*  HCT 29.7*  PLT 219   BMET:  Recent Labs    08/07/21 0555 08/08/21 0117  NA 135 136  K 4.0 4.6  CL 99 100  CO2 27 25  GLUCOSE 103* 112*  BUN 44* 46*  CREATININE 1.40* 1.49*  CALCIUM 8.1* 8.4*    PT/INR: No results for input(s): LABPROT, INR in the last 72 hours. ABG    Component Value Date/Time   PHART 7.332 (L) 08/01/2021 2054   HCO3 23.2 08/01/2021 2054   TCO2 24 08/01/2021 2054   ACIDBASEDEF 3.0 (H) 08/01/2021 2054   O2SAT 59.0 08/04/2021 0451   CBG (last 3)  Recent Labs    08/06/21 0810 08/06/21 1047 08/06/21 1614  GLUCAP 128* 102* 109*    Meds Scheduled Meds:   aspirin EC  325 mg Oral Daily   Or   aspirin  324 mg Per Tube Daily   bisacodyl  10 mg Oral Daily   Or   bisacodyl  10 mg Rectal Daily   Chlorhexidine Gluconate Cloth  6 each Topical Daily   docusate sodium  200 mg Oral Daily   furosemide  40 mg Intravenous Daily   mouth rinse  15 mL Mouth Rinse BID   melatonin  3 mg Oral QHS   metolazone  5 mg Oral Daily   metoprolol tartrate  12.5 mg Oral BID   pantoprazole  40 mg Oral Daily   potassium chloride  20 mEq Oral Daily   rosuvastatin  20 mg Oral Daily   sodium chloride flush  10-40 mL Intracatheter Q12H   sodium chloride flush  3 mL Intravenous Q12H   Continuous Infusions: PRN Meds:.ondansetron (ZOFRAN) IV, oxyCODONE, sodium chloride flush, sodium chloride flush, traMADol  Xrays DG Chest Port 1 View  Result Date: 08/06/2021 CLINICAL DATA:  Coronary bypass surgery EXAM: PORTABLE CHEST 1 VIEW COMPARISON:  Previous studies including the examination of 08/05/2021 FINDINGS: Transverse diameter of heart is increased. Tip of right IJ central venous catheter is seen in the superior vena cava. Metallic sutures seen in the sternum. Increased density in the left lower lung fields suggests pleural effusion and possibly underlying  infiltrate. There is faint haziness in the right lower lung fields suggesting layering of small pleural effusion. There is no pneumothorax. IMPRESSION: There are no signs of pulmonary edema. Bilateral pleural effusions, more so on the left side. There is no pneumothorax. Electronically Signed   By: Elmer Picker M.D.   On: 08/06/2021 12:25    Assessment/Plan: S/P Procedure(s) (LRB): CORONARY ARTERY BYPASS GRAFTING (CABG) X 3  ,ON PUMP, USING LEFT AND RIGHT INTERNAL MAMMARY ARTERIES, RIGHT AND LEFT ENDOSCOPIC GREATER SAPHENOUS VEIN CONDUITS (N/A) TRANSESOPHAGEAL ECHOCARDIOGRAM (TEE) (N/A) APPLICATION OF CELL SAVER ENDOVEIN HARVEST OF GREATER SAPHENOUS VEIN (Right) POD#7  1 afeb, VSS, sinus rhythm 2 sats good on  RA 3 good UOP, weight stable, creat up a little from yesterday but overall pretty stable , 1.49 BUN 46. Edema lowly improving - currently on IV lasix and zaroxolyn- cont for now, recheck labs  in am 4 cont rehab, home health at d/c 5 will check left arm venous duplex  LOS: 12 days    John Giovanni PA-C Pager [phone] 08/08/2021    Chart reviewed, patient examined, agree with above. Says he had a good day. Ambulated 3 times. Ate all of dinner.  Venous duplex of LUE shows no DVT but consistent with acute superficial vein thrombosis of left cephalic vein. Treatment for this is local with heat, elevation. Will put him on Keflex for a short course which may help.

## 2021-08-08 NOTE — Progress Notes (Signed)
LUE venous duplex has been completed.  Preliminary results given to [employer], RN.   Results can be found under chart review under CV PROC. 08/08/2021 10:47 AM Adriann Thau RVT, RDMS

## 2021-08-08 NOTE — Progress Notes (Signed)
Notified by CCMD pt had an 11 beat run of v-tach. Notified provider. New orders placed.  Raelyn Number, RN

## 2021-08-09 LAB — BASIC METABOLIC PANEL
Anion gap: 9 (ref 5–15)
BUN: 36 mg/dL — ABNORMAL HIGH (ref 8–23)
CO2: 26 mmol/L (ref 22–32)
Calcium: 8.2 mg/dL — ABNORMAL LOW (ref 8.9–10.3)
Chloride: 103 mmol/L (ref 98–111)
Creatinine, Ser: 1.29 mg/dL — ABNORMAL HIGH (ref 0.61–1.24)
GFR, Estimated: 54 mL/min — ABNORMAL LOW (ref >60)
Glucose, Bld: 108 mg/dL — ABNORMAL HIGH (ref 70–99)
Potassium: 4.4 mmol/L (ref 3.5–5.1)
Sodium: 138 mmol/L (ref 135–145)

## 2021-08-09 NOTE — Progress Notes (Signed)
Progress Note  Patient Name: Danny [name] Date of Encounter: 08/09/2021  [facility] Cardiologist: Mertie Moores, MD   Subjective   Sitting up in the chair. No complaints.   Inpatient Medications    Scheduled Meds:  aspirin EC  325 mg Oral Daily   Or   aspirin  324 mg Per Tube Daily   bisacodyl  10 mg Oral Daily   Or   bisacodyl  10 mg Rectal Daily   cephALEXin  500 mg Oral Q8H   Chlorhexidine Gluconate Cloth  6 each Topical Daily   docusate sodium  200 mg Oral Daily   furosemide  40 mg Intravenous Daily   mouth rinse  15 mL Mouth Rinse BID   melatonin  3 mg Oral QHS   metolazone  5 mg Oral Daily   metoprolol tartrate  25 mg Oral BID   pantoprazole  40 mg Oral Daily   potassium chloride  20 mEq Oral Daily   rosuvastatin  20 mg Oral Daily   sodium chloride flush  10-40 mL Intracatheter Q12H   sodium chloride flush  3 mL Intravenous Q12H   Continuous Infusions:  PRN Meds: ondansetron (ZOFRAN) IV, oxyCODONE, sodium chloride flush, sodium chloride flush, traMADol   Vital Signs    Vitals:   08/08/21 1927 08/09/21 0152 08/09/21 0600 08/09/21 0738  BP: (!) 105/54 129/67 118/62 (!) 127/59  Pulse: 77 77 70 73  Resp: 16 16 16 18   Temp: 98.7 F (37.1 C) 98.4 F (36.9 C) 98.5 F (36.9 C) 97.9 F (36.6 C)  TempSrc: Oral Oral Oral Oral  SpO2: 98% 98%  97%  Weight:   73.1 kg   Height:        Intake/Output Summary (Last 24 hours) at 08/09/2021 1114 Last data filed at 08/09/2021 0936 Gross per 24 hour  Intake 360 ml  Output 300 ml  Net 60 ml    Last 3 Weights 08/09/2021 08/08/2021 08/07/2021  Weight (lbs) 161 lb 2.5 oz 165 lb 5.5 oz 167 lb 5.3 oz  Weight (kg) 73.1 kg 75 kg 75.9 kg      Telemetry    SR, PVCs - Personally Reviewed  ECG    No new tracing  Physical Exam   GEN: No acute distress.   Neck: No JVD Cardiac: RRR, no murmurs, rubs, or gallops.  Respiratory: Clear to auscultation bilaterally. GI: Soft, nontender, non-distended  MS: Bilateral  2+ pitting LE edema Neuro:  Nonfocal  Psych: Normal affect   Labs    High Sensitivity Troponin:   Recent Labs  Lab 07/27/21 0025 07/27/21 0225 07/27/21 0530 07/27/21 0928  TROPONINIHS 9 61* 993* 5,799*      Chemistry Recent Labs  Lab 08/02/21 1716 08/03/21 0430 08/07/21 0555 08/08/21 0117 08/09/21 0238  NA 139   < > 135 136 138  K 4.5   < > 4.0 4.6 4.4  CL 106   < > 99 100 103  CO2 23   < > 27 25 26   GLUCOSE 95   < > 103* 112* 108*  BUN 38*   < > 44* 46* 36*  CREATININE 1.62*   < > 1.40* 1.49* 1.29*  CALCIUM 8.1*   < > 8.1* 8.4* 8.2*  MG 2.7*  --   --   --   --   GFRNONAA 41*   < > 49* 46* 54*  ANIONGAP 10   < > 9 11 9    < > = values in this  interval not displayed.     Lipids No results for input(s): CHOL, TRIG, HDL, LABVLDL, LDLCALC, CHOLHDL in the last 168 hours.  Hematology Recent Labs  Lab 08/03/21 0430 08/04/21 0451 08/07/21 0659  WBC 9.4 9.2 6.0  RBC 2.96* 3.15* 3.05*  HGB 9.5* 10.2* 9.6*  HCT 28.6* 30.7* 29.7*  MCV 96.6 97.5 97.4  MCH 32.1 32.4 31.5  MCHC 33.2 33.2 32.3  RDW 16.2* 15.5 14.9  PLT 134* 166 219    Thyroid No results for input(s): TSH, FREET4 in the last 168 hours.  BNPNo results for input(s): BNP, PROBNP in the last 168 hours.  DDimer No results for input(s): DDIMER in the last 168 hours.   Radiology    VAS [place] UPPER EXTREMITY VENOUS DUPLEX  Result Date: 08/08/2021 UPPER VENOUS STUDY  Patient Name:  WAQAS BRUHL  Date of Exam:   08/08/2021 Medical Rec #: [id number]       Accession #:    [id number] Date of Birth: 1934/06/17      Patient Gender: M Patient Age:   88 years Exam Location:  [facility] Procedure:      VAS [place] UPPER EXTREMITY VENOUS DUPLEX Referring Phys: Patrick Jupiter GOLD --------------------------------------------------------------------------------  Indications: Swelling Comparison Study: No previous exam Performing Technologist: Jody Hill RVT, RDMS  Examination Guidelines: A complete evaluation includes B-mode imaging,  spectral Doppler, color Doppler, and power Doppler as needed of all accessible portions of each vessel. Bilateral testing is considered an integral part of a complete examination. Limited examinations for reoccurring indications may be performed as noted.  Right Findings: +----------+------------+---------+-----------+----------+-------+  RIGHT      Compressible Phasicity Spontaneous Properties Summary  +----------+------------+---------+-----------+----------+-------+  Subclavian     Full        Yes        Yes                         +----------+------------+---------+-----------+----------+-------+  Left Findings: +----------+------------+---------+-----------+----------+--------------------+  LEFT       Compressible Phasicity Spontaneous Properties       Summary         +----------+------------+---------+-----------+----------+--------------------+  IJV            Full        Yes        Yes                                      +----------+------------+---------+-----------+----------+--------------------+  Subclavian     Full        Yes        Yes                                      +----------+------------+---------+-----------+----------+--------------------+  Axillary       Full        Yes        Yes                                      +----------+------------+---------+-----------+----------+--------------------+  Brachial       Full        Yes        Yes                                      +----------+------------+---------+-----------+----------+--------------------+  Radial         Full                                                            +----------+------------+---------+-----------+----------+--------------------+  Ulnar          Full                                                            +----------+------------+---------+-----------+----------+--------------------+  Cephalic       None        No         No                  Acute - area of AC                                                                     fossa          +----------+------------+---------+-----------+----------+--------------------+  Basilic        Full        Yes        Yes                                      +----------+------------+---------+-----------+----------+--------------------+  Summary:  Right: No evidence of thrombosis in the subclavian.  Left: No evidence of deep vein thrombosis in the upper extremity. Findings consistent with acute superficial vein thrombosis involving the left cephalic vein.  *See table(s) above for measurements and observations.  Diagnosing physician: Monica Martinez MD Electronically signed by Monica Martinez MD on 08/08/2021 at 1:48:43 PM.    Final     Cardiac Studies   Echo 07/28/2021:   1. Akinesis of the basal inferior wall with overall preserved LV  function; calcified aortic valve with very mild AS (mean gradient 9 mmHg).   2. Left ventricular ejection fraction, by estimation, is 55 to 60%. The  left ventricle has normal function. The left ventricle demonstrates  regional wall motion abnormalities (see scoring diagram/findings for  description). Left ventricular diastolic  parameters are consistent with Grade I diastolic dysfunction (impaired  relaxation).   3. Right ventricular systolic function is normal. The right ventricular  size is normal. There is normal pulmonary artery systolic pressure.   4. The mitral valve is normal in structure. Trivial mitral valve  regurgitation. No evidence of mitral stenosis.   5. The aortic valve is calcified. Aortic valve regurgitation is mild. No  aortic stenosis is present.   6. Aortic dilatation noted. There is borderline dilatation of the aortic  root, measuring 39 mm.   7. The inferior vena cava is normal in size with greater than 50%  respiratory variability, suggesting right atrial pressure of 3 mmHg.   Patient Profile     88 y.o. male with  PMH of HTN, HLD who presented with chest pain/NSTEMI. Underwent cardiac cath  noted above with multivessel CAD, now s/p CABG.   Assessment & Plan    CAD s/p CABG: progressing slowly, but improving. Treated with ASA, statin, BB -- working with CR/PT with recommendations for HHPT at discharge  Post op Volume overload: continues to have LE edema/left forearm edema -- on IV lasix 40mg  daily,  metolazone 5mg  daily  -- negative I/O, weight coming down. --renal parameters are better.   HLD: on statin   For questions or updates, please contact [place] Please consult [email or web address] for contact info under        Signed, Altagracia Rone Martin[place], MD  08/09/2021, 11:14 AM

## 2021-08-09 NOTE — Progress Notes (Signed)
Mobility Specialist: Progress Note   08/09/21 1538  Mobility  Bed Position Chair  Activity Ambulated with assistance in hallway  Level of Assistance Contact guard assist, steadying assist  Assistive Device Front wheel walker  Distance Ambulated (ft) 320 ft  Activity Response Tolerated well  $Mobility charge 1 Mobility   Pre-Mobility: 76 HR Post-Mobility: 84 HR  Received pt in chair having no complaints and agreeable to mobility. Asymptomatic throughout ambulation, returned back to chair w/ call bell in reach and all needs met.   [facility] Danny [name] Mobility Specialist Mobility Specialist 4 [place]: [phone] Mobility Specialist 2 [place] and [place]: [phone]

## 2021-08-09 NOTE — Progress Notes (Signed)
CARDIAC REHAB PHASE I   PRE:  Rate/Rhythm: 71 Trigeminy  BP:  Sitting: 107/62      SaO2: 96 RA  MODE:  Ambulation: 270 ft   POST:  Rate/Rhythm: 80 Trigeminy  BP:  Sitting: 125/66    SaO2: 97 RA   Pt agreeable to ambulation. Pt assisted to EOB, c/o L knee pain. Pt ambulated 250ft in hallway assist of one with front wheel walker. Pt denies CP, SOB, or dizziness. Pt returned to recliner. Hot pack placed on L knee. Pt appreciative of walk. Requesting walker and 3-in-1 for home use, CM made aware. Call bell and bedside table within reach. Encouraged continued ambulation and IS use. Will continue to follow.  [id number] Rufina Falco, RN BSN 08/09/2021 10:46 AM

## 2021-08-09 NOTE — Progress Notes (Addendum)
[phone]       [place] [id number]             [phone]      8 Days Post-Op Procedure(s) (LRB): CORONARY ARTERY BYPASS GRAFTING (CABG) X 3  ,ON PUMP, USING LEFT AND RIGHT INTERNAL MAMMARY ARTERIES, RIGHT AND LEFT ENDOSCOPIC GREATER SAPHENOUS VEIN CONDUITS (N/A) TRANSESOPHAGEAL ECHOCARDIOGRAM (TEE) (N/A) APPLICATION OF CELL SAVER ENDOVEIN HARVEST OF GREATER SAPHENOUS VEIN (Right) Subjective: Feels pretty well, some reflux/indigestion- not uncommon for him  Objective: Vital signs in last 24 hours: Temp:  [97.7 F (36.5 C)-98.8 F (37.1 C)] 98.5 F (36.9 C) (01/31 0600) Pulse Rate:  [70-81] 70 (01/31 0600) Cardiac Rhythm: Normal sinus rhythm (01/30 1922) Resp:  [16-18] 16 (01/31 0600) BP: (105-130)/(54-87) 118/62 (01/31 0600) SpO2:  [97 %-100 %] 98 % (01/31 0152) Weight:  [73.1 kg] 73.1 kg (01/31 0600)  Hemodynamic parameters for last 24 hours:    Intake/Output from previous day: 01/30 0701 - 01/31 0700 In: -  Out: 500 [Urine:500] Intake/Output this shift: No intake/output data recorded.  General appearance: alert, cooperative, and no distress Heart: regular rate and rhythm and occas extrasystole Lungs: clear Abdomen: benign Extremities: + pitting edema Wound: incis healing well  Lab Results: Recent Labs    08/07/21 0659  WBC 6.0  HGB 9.6*  HCT 29.7*  PLT 219   BMET:  Recent Labs    08/08/21 0117 08/09/21 0238  NA 136 138  K 4.6 4.4  CL 100 103  CO2 25 26  GLUCOSE 112* 108*  BUN 46* 36*  CREATININE 1.49* 1.29*  CALCIUM 8.4* 8.2*    PT/INR: No results for input(s): LABPROT, INR in the last 72 hours. ABG    Component Value Date/Time   PHART 7.332 (L) 08/01/2021 2054   HCO3 23.2 08/01/2021 2054   TCO2 24 08/01/2021 2054   ACIDBASEDEF 3.0 (H) 08/01/2021 2054   O2SAT 59.0 08/04/2021 0451   CBG (last 3)  Recent Labs    08/06/21 0810 08/06/21 1047 08/06/21 1614  GLUCAP 128* 102* 109*    Meds Scheduled Meds:   aspirin EC  325 mg Oral Daily   Or   aspirin  324 mg Per Tube Daily   bisacodyl  10 mg Oral Daily   Or   bisacodyl  10 mg Rectal Daily   cephALEXin  500 mg Oral Q8H   Chlorhexidine Gluconate Cloth  6 each Topical Daily   docusate sodium  200 mg Oral Daily   furosemide  40 mg Intravenous Daily   mouth rinse  15 mL Mouth Rinse BID   melatonin  3 mg Oral QHS   metolazone  5 mg Oral Daily   metoprolol tartrate  25 mg Oral BID   pantoprazole  40 mg Oral Daily   potassium chloride  20 mEq Oral Daily   rosuvastatin  20 mg Oral Daily   sodium chloride flush  10-40 mL Intracatheter Q12H   sodium chloride flush  3 mL Intravenous Q12H   Continuous Infusions: PRN Meds:.ondansetron (ZOFRAN) IV, oxyCODONE, sodium chloride flush, sodium chloride flush, traMADol  Xrays VAS [place] UPPER EXTREMITY VENOUS DUPLEX  Result Date: 08/08/2021 UPPER VENOUS STUDY  Patient Name:  Danny [name]  Date of Exam:   08/08/2021 Medical Rec #: [id number]       Accession #:    [id number] Date of Birth: 1934-01-19      Patient Gender: M Patient Age:   88 years Exam  Location:  [facility] Procedure:      VAS [place] UPPER EXTREMITY VENOUS DUPLEX Referring Phys: WAYNE GOLD --------------------------------------------------------------------------------  Indications: Swelling Comparison Study: No previous exam Performing Technologist: Jody Hill RVT, RDMS  Examination Guidelines: A complete evaluation includes B-mode imaging, spectral Doppler, color Doppler, and power Doppler as needed of all accessible portions of each vessel. Bilateral testing is considered an integral part of a complete examination. Limited examinations for reoccurring indications may be performed as noted.  Right Findings: +----------+------------+---------+-----------+----------+-------+  RIGHT      Compressible Phasicity Spontaneous Properties Summary  +----------+------------+---------+-----------+----------+-------+  Subclavian     Full        Yes        Yes                          +----------+------------+---------+-----------+----------+-------+  Left Findings: +----------+------------+---------+-----------+----------+--------------------+  LEFT       Compressible Phasicity Spontaneous Properties       Summary         +----------+------------+---------+-----------+----------+--------------------+  IJV            Full        Yes        Yes                                      +----------+------------+---------+-----------+----------+--------------------+  Subclavian     Full        Yes        Yes                                      +----------+------------+---------+-----------+----------+--------------------+  Axillary       Full        Yes        Yes                                      +----------+------------+---------+-----------+----------+--------------------+  Brachial       Full        Yes        Yes                                      +----------+------------+---------+-----------+----------+--------------------+  Radial         Full                                                            +----------+------------+---------+-----------+----------+--------------------+  Ulnar          Full                                                            +----------+------------+---------+-----------+----------+--------------------+  Cephalic       None        No  No                  Acute - area of AC                                                                    fossa          +----------+------------+---------+-----------+----------+--------------------+  Basilic        Full        Yes        Yes                                      +----------+------------+---------+-----------+----------+--------------------+  Summary:  Right: No evidence of thrombosis in the subclavian.  Left: No evidence of deep vein thrombosis in the upper extremity. Findings consistent with acute superficial vein thrombosis involving the left cephalic vein.  *See table(s) above  for measurements and observations.  Diagnosing physician: Monica Martinez MD Electronically signed by Monica Martinez MD on 08/08/2021 at 1:48:43 PM.    Final     Assessment/Plan: S/P Procedure(s) (LRB): CORONARY ARTERY BYPASS GRAFTING (CABG) X 3  ,ON PUMP, USING LEFT AND RIGHT INTERNAL MAMMARY ARTERIES, RIGHT AND LEFT ENDOSCOPIC GREATER SAPHENOUS VEIN CONDUITS (N/A) TRANSESOPHAGEAL ECHOCARDIOGRAM (TEE) (N/A) APPLICATION OF CELL SAVER ENDOVEIN HARVEST OF GREATER SAPHENOUS VEIN (Right)  POD#8 1 afeb, VSS, frequent PVC's 2 sats good on RA 3 weight trending down, good diuresis, not all measured  but multiple voids- creat down to 1.29, BUN 36- cont same diuretics one more day  4 on keflex with cephalic vein thrombosis left arm, heat and elevation 5 he is close to discharge     LOS: 13 days    John Giovanni PA-C Pager [phone] 08/09/2021    Chart reviewed, patient examined, agree with above. He looks good overall. Wt is about 5 lbs over preop. Continue diuretic. Plan home tomorrow if no changes.

## 2021-08-10 ENCOUNTER — Other Ambulatory Visit (HOSPITAL_COMMUNITY): Payer: Self-pay

## 2021-08-10 LAB — BASIC METABOLIC PANEL
Anion gap: 9 (ref 5–15)
BUN: 34 mg/dL — ABNORMAL HIGH (ref 8–23)
CO2: 26 mmol/L (ref 22–32)
Calcium: 8.2 mg/dL — ABNORMAL LOW (ref 8.9–10.3)
Chloride: 100 mmol/L (ref 98–111)
Creatinine, Ser: 1.36 mg/dL — ABNORMAL HIGH (ref 0.61–1.24)
GFR, Estimated: 51 mL/min — ABNORMAL LOW (ref >60)
Glucose, Bld: 105 mg/dL — ABNORMAL HIGH (ref 70–99)
Potassium: 4.1 mmol/L (ref 3.5–5.1)
Sodium: 135 mmol/L (ref 135–145)

## 2021-08-10 MED ORDER — TRAMADOL HCL 50 MG PO TABS
50.0000 mg | ORAL_TABLET | Freq: Two times a day (BID) | ORAL | 0 refills | Status: AC | PRN
  Filled 2021-08-10: qty 14, 7d supply, fill #0

## 2021-08-10 MED ORDER — METOPROLOL TARTRATE 25 MG PO TABS
25.0000 mg | ORAL_TABLET | Freq: Two times a day (BID) | ORAL | 1 refills | Status: DC
  Filled 2021-08-10: qty 60, 30d supply, fill #0

## 2021-08-10 MED ORDER — FUROSEMIDE 40 MG PO TABS
40.0000 mg | ORAL_TABLET | Freq: Every day | ORAL | 0 refills | Status: DC
  Filled 2021-08-10: qty 10, 10d supply, fill #0

## 2021-08-10 MED ORDER — ASPIRIN 325 MG PO TBEC: 325.0000 mg | DELAYED_RELEASE_TABLET | Freq: Every day | ORAL | Status: DC

## 2021-08-10 MED ORDER — ROSUVASTATIN CALCIUM 20 MG PO TABS
20.0000 mg | ORAL_TABLET | Freq: Every day | ORAL | 1 refills | Status: DC
  Filled 2021-08-10: qty 30, 30d supply, fill #0

## 2021-08-10 MED ORDER — POTASSIUM CHLORIDE CRYS ER 20 MEQ PO TBCR
20.0000 meq | EXTENDED_RELEASE_TABLET | Freq: Every day | ORAL | 0 refills | Status: DC
  Filled 2021-08-10: qty 10, 10d supply, fill #0

## 2021-08-10 MED ORDER — CEPHALEXIN 500 MG PO CAPS
500.0000 mg | ORAL_CAPSULE | Freq: Three times a day (TID) | ORAL | 0 refills | Status: DC
  Filled 2021-08-10: qty 15, 5d supply, fill #0

## 2021-08-10 MED ORDER — MELATONIN 3 MG PO TABS: 3.0000 mg | ORAL_TABLET | Freq: Every day | ORAL | 0 refills | Status: DC

## 2021-08-10 NOTE — TOC Transition Note (Signed)
Transition of Care (TOC) - CM/SW Discharge Note Marvetta Gibbons RN, BSN Transitions of Care Unit 4E- RN Case Manager See Treatment Team for direct phone #    Patient Details  Name: Danny [name] MRN: [id number] Date of Birth: June 01, 1934  Transition of Care [facility]) CM/SW Contact:  Dawayne Patricia, RN Phone Number: 08/10/2021, 10:17 AM   Clinical Narrative:    Pt stable for transition home today, Noted recommendations for both HHPT/OT- will request OT to be added to [facility] orders. Referral has been accepted by [facility] per previous CM note. Notified Cory with [facility] for updated [place] need PTOT start of care.  Orders for DME- RW and 3n1- call made to [employer] for DME needs- RW and 3n1 to be delivered to room prior to discharge.   TOC pharmacy to deliver medications to bedside prior to discharge.  Family to transport home.    Final next level of care: [place] Barriers to Discharge: Barriers Resolved   Patient Goals and CMS Choice Patient states their goals for this hospitalization and ongoing recovery are:: return home after surgery CMS Medicare.gov Compare Post Acute Care list provided to:: Patient Represenative (must comment) Choice offered to / list presented to : Spouse, Patient  Discharge Placement               Home w/ [facility]        Discharge Plan and Services In-house Referral: NA Discharge Planning Services: CM Consult Post Acute Care Choice: Home Health          DME Arranged: 3-N-1, Walker rolling DME Agency: [employer] Date DME Agency Contacted: 08/10/21 Time DME Agency Contacted: 1000 Representative spoke with at DME Agency: Freda Munro HH Arranged: PT, OT [place] Agency: [place] Date [place]: 08/10/21 Time [place]: 1015 Representative spoke with at [place]: [place] ([place]) Interventions     Readmission Risk Interventions Readmission Risk Prevention Plan 08/10/2021  Post Dischage Appt  Complete  Medication Screening Complete  Transportation Screening Complete  Some recent data might be hidden

## 2021-08-10 NOTE — Progress Notes (Signed)
Physical Therapy Treatment Patient Details Name: Danny [name] MRN: [id number] DOB: 1933/08/17 Today's Date: 08/10/2021   History of Present Illness 88 y.o. male who presented 07/27/21 after being found unresponsive and shaking. Pt admitted with NSTEMI. S/p cardiac cath 1/18 which revealed triple-vessel disease. 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 MRI of brain negative. PMH: diverticulitis, HTN, HLD    PT Comments    Focused session on educating pt on sternal precautions, s/p CABG HEP handout provided again today, and proper, safe transfers. Pt still with posterior lean when transferring to stand, resulting in him relying on the posterior aspects of his legs on the chair for balance, but was able to progress from needing minA to only needing min guard assist for transfers as his anterior weight transition improved with subsequent reps. Educated pt to have his daughter assist him at d/c for safety purposes, he verbalized understanding. Will continue to follow acutely. Current recommendations remain appropriate.    Recommendations for follow up therapy are one component of a multi-disciplinary discharge planning process, led by the attending physician.  Recommendations may be updated based on patient status, additional functional criteria and insurance authorization.  Follow Up Recommendations  Home health PT     Assistance Recommended at Discharge Intermittent Supervision/Assistance  Patient can return home with the following A little help with walking and/or transfers;A little help with bathing/dressing/bathroom;Assistance with cooking/housework;Direct supervision/assist for medications management;Direct supervision/assist for financial management;Assist for transportation;Help with stairs or ramp for entrance   Equipment Recommendations  Rolling walker (2 wheels)    Recommendations for Other Services       Precautions / Restrictions Precautions Precautions: Fall;Other (comment);Sternal Precaution Booklet  Issued: Yes (comment) Precaution Comments: Reviewed sternal precautions Restrictions Weight Bearing Restrictions: Yes Other Position/Activity Restrictions: sternal precautions     Mobility  Bed Mobility               General bed mobility comments: Sitting up in chair upon arrival.    Transfers Overall transfer level: Needs assistance Equipment used: Rolling walker (2 wheels) Transfers: Sit to/from Stand Sit to Stand: Min guard, Min assist           General transfer comment: x6 sit to stand transfers from recliner, initially needing light minA for stability but quickly progressed to min guard assist as his anterior weight transition improved with cues for feet placement and momentum of trunk to come to stand. Intermittent reminders for hands on lap to comply with sternal precautions.    Ambulation/Gait Ambulation/Gait assistance: Min guard Gait Distance (Feet): 20 Feet Assistive device: Rolling walker (2 wheels) Gait Pattern/deviations: Step-through pattern, Decreased stride length, Trunk flexed Gait velocity: reduced Gait velocity interpretation: 1.31 - 2.62 ft/sec, indicative of limited community ambulator   General Gait Details: Slow, mostly steady gait with RW for support, cues for upright posture.   Stairs             Wheelchair Mobility    Modified Rankin (Stroke Patients Only)       Balance Overall balance assessment: Needs assistance Sitting-balance support: Feet supported, No upper extremity supported Sitting balance-Leahy Scale: Fair     Standing balance support: During functional activity, No upper extremity supported, Bilateral upper extremity supported Standing balance-Leahy Scale: Fair Standing balance comment: able to stand with feet static without UE support, relies on RW for mobility                            Cognition Arousal/Alertness: Awake/alert  Behavior During Therapy: WFL for tasks assessed/performed Overall  Cognitive Status: Impaired/Different from baseline Area of Impairment: Memory, Safety/judgement, Attention                   Current Attention Level: Selective Memory: Decreased recall of precautions   Safety/Judgement: Decreased awareness of safety     General Comments: better recall of precautions, intermittently needs reminders for compliance. Self distracts often, needing cues to remain on task.        Exercises Other Exercises Other Exercises: sit <> stand from recliner 6x    General Comments General comments (skin integrity, edema, etc.): Provided another s/p CABG HEP handout and reviewed      Pertinent Vitals/Pain Pain Assessment Pain Assessment: Faces Faces Pain Scale: Hurts a little bit Pain Location: chest Pain Descriptors / Indicators: Discomfort, Grimacing Pain Intervention(s): Limited activity within patient's tolerance, Monitored during session, Repositioned    Home Living                          Prior Function            PT Goals (current goals can now be found in the care plan section) Acute Rehab PT Goals Patient Stated Goal: to go home PT Goal Formulation: With patient Time For Goal Achievement: 08/18/21 Potential to Achieve Goals: Good Progress towards PT goals: Progressing toward goals    Frequency    Min 3X/week      PT Plan Current plan remains appropriate    Co-evaluation              AM-PAC PT "6 Clicks" Mobility   Outcome Measure  Help needed turning from your back to your side while in a flat bed without using bedrails?: A Little Help needed moving from lying on your back to sitting on the side of a flat bed without using bedrails?: A Lot Help needed moving to and from a bed to a chair (including a wheelchair)?: A Little Help needed standing up from a chair using your arms (e.g., wheelchair or bedside chair)?: A Little Help needed to walk in hospital room?: A Little Help needed climbing 3-5 steps with a  railing? : A Lot 6 Click Score: 16    End of Session   Activity Tolerance: Patient tolerated treatment well Patient left: Other (comment) (ambulating with mobility specialist) Nurse Communication: Mobility status PT Visit Diagnosis: Unsteadiness on feet (R26.81);Other abnormalities of gait and mobility (R26.89);Muscle weakness (generalized) (M62.81);Difficulty in walking, not elsewhere classified (R26.2) Pain - part of body:  (sternum)     Time: 1051-1105 PT Time Calculation (min) (ACUTE ONLY): 14 min  Charges:  $Therapeutic Activity: 8-22 mins                     Moishe Spice, PT, DPT Acute Rehabilitation Services  Pager: [phone] Office: [place] 08/10/2021, 11:54 AM

## 2021-08-10 NOTE — Progress Notes (Signed)
CARDIAC REHAB PHASE I   D/c education completed with pt. Pt educated on importance of site care and monitoring incisions daily. Encouraged continued IS use, walks, and sternal precautions. Pt given in-the-tube sheet along with heart healthy diet. Reviewed restrictions and exercise guidelines. Pt waiting on DME. Will refer to CRP II [place].  [id number] Rufina Falco, RN BSN 08/10/2021 8:58 AM

## 2021-08-10 NOTE — Progress Notes (Signed)
CARDIOLOGY RECOMMENDATIONS:  Discharge is anticipated in the next 48 hours. Recommendations for medications and follow up:  Discharge Medications: Continue medications as they are currently listed in the [facility]. Exceptions to the above: none  Follow Up: The patient's Primary Cardiologist is Mertie Moores, MD  Follow up in the office in 2 week(s).  Signed,  Dung Prien Martinique, MD  11:10 AM 08/10/2021  [facility]

## 2021-08-10 NOTE — Progress Notes (Signed)
Mobility Specialist: Progress Note   08/10/21 1116  Mobility  Bed Position Chair  Activity Ambulated with assistance in hallway  Level of Assistance Modified independent, requires aide device or extra time  Assistive Device Front wheel walker  Distance Ambulated (ft) 400 ft  Activity Response Tolerated well  $Mobility charge 1 Mobility   Pre-Mobility: 69 HR Post-Mobility: 71 HR, 119/51 BP, 96% SpO2  Received pt in chair having no complaints and agreeable to mobility. Performed STS x5 with PT and then mobility with MS. Asymptomatic throughout ambulation, returned back to [place] w/ call bell in reach and all needs met.  [facility] Mobility Specialist Mobility Specialist 4 [place]: [phone] Mobility Specialist 2 [place] and [place]: [phone]

## 2021-08-10 NOTE — Progress Notes (Signed)
Occupational Therapy Treatment Patient Details Name: Danny [name] MRN: [id number] DOB: May 20, 1934 Today's Date: 08/10/2021   History of present illness 88 y.o. male who presented 07/27/21 after being found unresponsive and shaking. Pt admitted with NSTEMI. S/p cardiac cath 1/18 which revealed triple-vessel disease. 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 MRI of brain negative. PMH: diverticulitis, HTN, HLD   OT comments  Patient received sitting on EOB and eager to participate. Patient was able to stand from EOB without verbal cues for hand placement and demonstrated good understanding of sternal precautions. Patient performed grooming and bathing standing at sink demonstrating good safety.  Dressing performed sitting on EOB for underwear and shorts and pullover top with verbal cues for compensatory techniques.  Patient is expected to return home with wife.     Recommendations for follow up therapy are one component of a multi-disciplinary discharge planning process, led by the attending physician.  Recommendations may be updated based on patient status, additional functional criteria and insurance authorization.    Follow Up Recommendations  Home health OT    Assistance Recommended at Discharge Intermittent Supervision/Assistance  Patient can return home with the following  Assistance with cooking/housework;A little help with walking and/or transfers;A little help with bathing/dressing/bathroom   Equipment Recommendations  None recommended by OT    Recommendations for Other Services      Precautions / Restrictions Precautions Precautions: Fall;Other (comment);Sternal Precaution Booklet Issued: Yes (comment) Precaution Comments: Reviewed sternal precautions Restrictions Weight Bearing Restrictions: Yes Other Position/Activity Restrictions: sternal precautions       Mobility Bed Mobility Overal bed mobility: Needs Assistance             General bed mobility comments: sitting on EOB upone  arrival    Transfers Overall transfer level: Needs assistance Equipment used: Rolling walker (2 wheels) Transfers: Sit to/from Stand Sit to Stand: Min guard           General transfer comment: performed proper hand placement without verbal cues     Balance Overall balance assessment: Needs assistance Sitting-balance support: Feet supported, No upper extremity supported Sitting balance-Leahy Scale: Fair Sitting balance - Comments: sitting on EOB upon arrival   Standing balance support: During functional activity Standing balance-Leahy Scale: Fair Standing balance comment: able to stand at sink without UE support                           ADL either performed or assessed with clinical judgement   ADL Overall ADL's : Needs assistance/impaired     Grooming: Supervision/safety;Standing;Wash/dry hands;Wash/dry face;Oral care Grooming Details (indicate cue type and reason): performed standing at sink Upper Body Bathing: Minimal assistance;Standing Upper Body Bathing Details (indicate cue type and reason): performed standing at sink Lower Body Bathing: Minimal assistance Lower Body Bathing Details (indicate cue type and reason): diffiulty with feet Upper Body Dressing : Minimal assistance;Sitting Upper Body Dressing Details (indicate cue type and reason): Min A for repositioning shirt and maintaining sternal precautions Lower Body Dressing: Min guard;Sit to/from stand Lower Body Dressing Details (indicate cue type and reason): difficulty with fasteners required extra time to complete               General ADL Comments: focused on self care standing at sink and dressing while maintaining sternal precautions    Extremity/Trunk Assessment              Vision       Perception     Praxis  Cognition Arousal/Alertness: Awake/alert Behavior During Therapy: WFL for tasks assessed/performed Overall Cognitive Status: Impaired/Different from  baseline Area of Impairment: Memory, Safety/judgement                         Safety/Judgement: Decreased awareness of safety     General Comments: better recall of precautions, able to recall 2/3 precautions        Exercises      Shoulder Instructions       General Comments      Pertinent Vitals/ Pain       Pain Assessment Pain Assessment: Faces Faces Pain Scale: Hurts a little bit Pain Location: chest Pain Descriptors / Indicators: Discomfort, Grimacing Pain Intervention(s): Monitored during session, Repositioned  Home Living                                          Prior Functioning/Environment              Frequency  Min 2X/week        Progress Toward Goals  OT Goals(current goals can now be found in the care plan section)  Progress towards OT goals: Progressing toward goals  Acute Rehab OT Goals Patient Stated Goal: go home OT Goal Formulation: With patient Time For Goal Achievement: 08/18/21 Potential to Achieve Goals: Good ADL Goals Pt Will Perform Grooming: with min guard assist;standing Pt Will Perform Upper Body Dressing: sitting;with set-up;with supervision Pt Will Perform Lower Body Dressing: with min guard assist;sit to/from stand;with adaptive equipment Pt Will Transfer to Toilet: with min guard assist;ambulating;bedside commode Pt Will Perform Toileting - Clothing Manipulation and hygiene: with min guard assist;sitting/lateral leans;sit to/from stand;with adaptive equipment Pt Will Perform Tub/Shower Transfer: Shower transfer;with min guard assist;ambulating;shower seat  Plan Discharge plan needs to be updated    Co-evaluation                 AM-PAC OT "6 Clicks" Daily Activity     Outcome Measure   Help from another person eating meals?: None Help from another person taking care of personal grooming?: A Little Help from another person toileting, which includes using toliet, bedpan, or urinal?: A  Little Help from another person bathing (including washing, rinsing, drying)?: A Little Help from another person to put on and taking off regular upper body clothing?: A Little Help from another person to put on and taking off regular lower body clothing?: A Little 6 Click Score: 19    End of Session Equipment Utilized During Treatment: Rolling walker (2 wheels)  OT Visit Diagnosis: Unsteadiness on feet (R26.81);Other abnormalities of gait and mobility (R26.89);Muscle weakness (generalized) (M62.81)   Activity Tolerance Patient tolerated treatment well   Patient Left in chair;with call bell/phone within reach   Nurse Communication Mobility status        Time: 1950-9326 OT Time Calculation (min): 27 min  Charges: OT General Charges $OT Visit: 1 Visit OT Treatments $Self Care/Home Management : 23-37 mins  Lodema Hong, [place]  Pager [phone] Office [place] 08/10/2021, 8:57 AM

## 2021-08-10 NOTE — Progress Notes (Addendum)
[address]       [place] [address]             [phone]      9 Days Post-Op Procedure(s) (LRB): CORONARY ARTERY BYPASS GRAFTING (CABG) X 3  ,ON PUMP, USING LEFT AND RIGHT INTERNAL MAMMARY ARTERIES, RIGHT AND LEFT ENDOSCOPIC GREATER SAPHENOUS VEIN CONDUITS (N/A) TRANSESOPHAGEAL ECHOCARDIOGRAM (TEE) (N/A) APPLICATION OF CELL SAVER ENDOVEIN HARVEST OF GREATER SAPHENOUS VEIN (Right) Subjective: Conts to feel pretty well, some soreness  Objective: Vital signs in last 24 hours: Temp:  [97.8 F (36.6 C)-98.3 F (36.8 C)] 97.9 F (36.6 C) (02/01 0348) Pulse Rate:  [68-80] 68 (02/01 0348) Cardiac Rhythm: Normal sinus rhythm (01/31 1934) Resp:  [16-20] 16 (02/01 0348) BP: (101-127)/(59-68) 115/61 (02/01 0348) SpO2:  [96 %-100 %] 96 % (02/01 0348) Weight:  [72 kg] 72 kg (02/01 0348)  Hemodynamic parameters for last 24 hours:    Intake/Output from previous day: 01/31 0701 - 02/01 0700 In: 660 [P.O.:660] Out: 1450 [Urine:1450] Intake/Output this shift: No intake/output data recorded.  General appearance: alert, cooperative, and no distress Heart: regular rate and rhythm and frequent extrasystoles Lungs: mildl dim in bases Abdomen: benign Extremities: + LE edema, left arm swelling improved Wound: incis healing well  Lab Results: No results for input(s): WBC, HGB, HCT, PLT in the last 72 hours. BMET:  Recent Labs    08/09/21 0238 08/10/21 0231  NA 138 135  K 4.4 4.1  CL 103 100  CO2 26 26  GLUCOSE 108* 105*  BUN 36* 34*  CREATININE 1.29* 1.36*  CALCIUM 8.2* 8.2*    PT/INR: No results for input(s): LABPROT, INR in the last 72 hours. ABG    Component Value Date/Time   PHART 7.332 (L) 08/01/2021 2054   HCO3 23.2 08/01/2021 2054   TCO2 24 08/01/2021 2054   ACIDBASEDEF 3.0 (H) 08/01/2021 2054   O2SAT 59.0 08/04/2021 0451   CBG (last 3)  No results for input(s): GLUCAP in the last 72 hours.  Meds Scheduled Meds:  aspirin EC  325 mg  Oral Daily   Or   aspirin  324 mg Per Tube Daily   bisacodyl  10 mg Oral Daily   Or   bisacodyl  10 mg Rectal Daily   cephALEXin  500 mg Oral Q8H   docusate sodium  200 mg Oral Daily   furosemide  40 mg Intravenous Daily   mouth rinse  15 mL Mouth Rinse BID   melatonin  3 mg Oral QHS   metolazone  5 mg Oral Daily   metoprolol tartrate  25 mg Oral BID   pantoprazole  40 mg Oral Daily   potassium chloride  20 mEq Oral Daily   rosuvastatin  20 mg Oral Daily   sodium chloride flush  10-40 mL Intracatheter Q12H   sodium chloride flush  3 mL Intravenous Q12H   Continuous Infusions: PRN Meds:.ondansetron (ZOFRAN) IV, oxyCODONE, sodium chloride flush, sodium chloride flush, traMADol  Xrays VAS [place] UPPER EXTREMITY VENOUS DUPLEX  Result Date: 08/08/2021 UPPER VENOUS STUDY  Patient Name:  Danny [name]  Date of Exam:   08/08/2021 Medical Rec #: [id number]       Accession #:    [id number] Date of Birth: 09-Aug-1933      Patient Gender: M Patient Age:   88 years Exam Location:  [facility] Procedure:      VAS [place] UPPER EXTREMITY VENOUS DUPLEX Referring Phys: Patrick Jupiter GOLD --------------------------------------------------------------------------------  Indications: Swelling Comparison Study: No previous exam Performing Technologist: Jody Hill RVT, RDMS  Examination Guidelines: A complete evaluation includes B-mode imaging, spectral Doppler, color Doppler, and power Doppler as needed of all accessible portions of each vessel. Bilateral testing is considered an integral part of a complete examination. Limited examinations for reoccurring indications may be performed as noted.  Right Findings: +----------+------------+---------+-----------+----------+-------+  RIGHT      Compressible Phasicity Spontaneous Properties Summary  +----------+------------+---------+-----------+----------+-------+  Subclavian     Full        Yes        Yes                          +----------+------------+---------+-----------+----------+-------+  Left Findings: +----------+------------+---------+-----------+----------+--------------------+  LEFT       Compressible Phasicity Spontaneous Properties       Summary         +----------+------------+---------+-----------+----------+--------------------+  IJV            Full        Yes        Yes                                      +----------+------------+---------+-----------+----------+--------------------+  Subclavian     Full        Yes        Yes                                      +----------+------------+---------+-----------+----------+--------------------+  Axillary       Full        Yes        Yes                                      +----------+------------+---------+-----------+----------+--------------------+  Brachial       Full        Yes        Yes                                      +----------+------------+---------+-----------+----------+--------------------+  Radial         Full                                                            +----------+------------+---------+-----------+----------+--------------------+  Ulnar          Full                                                            +----------+------------+---------+-----------+----------+--------------------+  Cephalic       None        No         No                  Acute -  area of AC                                                                    fossa          +----------+------------+---------+-----------+----------+--------------------+  Basilic        Full        Yes        Yes                                      +----------+------------+---------+-----------+----------+--------------------+  Summary:  Right: No evidence of thrombosis in the subclavian.  Left: No evidence of deep vein thrombosis in the upper extremity. Findings consistent with acute superficial vein thrombosis involving the left cephalic vein.  *See table(s) above for measurements and  observations.  Diagnosing physician: Monica Martinez MD Electronically signed by Monica Martinez MD on 08/08/2021 at 1:48:43 PM.    Final     Assessment/Plan: S/P Procedure(s) (LRB): CORONARY ARTERY BYPASS GRAFTING (CABG) X 3  ,ON PUMP, USING LEFT AND RIGHT INTERNAL MAMMARY ARTERIES, RIGHT AND LEFT ENDOSCOPIC GREATER SAPHENOUS VEIN CONDUITS (N/A) TRANSESOPHAGEAL ECHOCARDIOGRAM (TEE) (N/A) APPLICATION OF CELL SAVER ENDOVEIN HARVEST OF GREATER SAPHENOUS VEIN (Right) POD#9  1 afeb, VSS , sinus rhythm, PVC's,including  bigemminy, no vtach- cont metoprolol, K= ok 2 sats good on RA 3 weight conts to trend lower, edema improving - cont lasix at home for a short term (10 ) days, stop zaroxolyn 4 creat 1.36 today, fairly stable 5 appears stable for d/c with HH arranged 6 left arm stable, cont short coarse of keflex 7 has blister on left foot, does not appear infected, dressing prn   LOS: 14 days    John Giovanni PA-C Pager [phone] 08/10/2021    Chart reviewed, patient examined, agree with above. He continues to make good progress. Will plan discharge today.

## 2021-08-10 NOTE — Progress Notes (Signed)
Chest tube sutures removed, per order. Benzoin and steri-strips applied. Sites clean and dry.  

## 2021-08-16 ENCOUNTER — Telehealth (HOSPITAL_COMMUNITY): Payer: Self-pay

## 2021-08-16 NOTE — Telephone Encounter (Signed)
Pt insurance is active and benefits verified through [facility]. Co-pay $20.00, DED $0.00/$0.00 met, out of pocket $4,000.00/$115.00 met, co-insurance 0%. No pre-authorization required. Passport, 08/16/21 @ 4:23PM, [id number]   Will contact patient to see if he is interested in the Cardiac Rehab Program. If interested, patient will need to complete follow up appt. Once completed, patient will be contacted for scheduling upon review by the RN Navigator.

## 2021-08-17 NOTE — Telephone Encounter (Signed)
Attempted to call patient in regards to Cardiac Rehab - unable to leave VM, VM box not set up.

## 2021-08-23 ENCOUNTER — Encounter: Payer: Self-pay | Admitting: Physician Assistant

## 2021-08-23 ENCOUNTER — Ambulatory Visit: Payer: Medicare PPO | Admitting: Physician Assistant

## 2021-08-23 ENCOUNTER — Other Ambulatory Visit: Payer: Self-pay

## 2021-08-23 VITALS — BP 152/62 | HR 75 | Ht 64.0 in | Wt 148.6 lb

## 2021-08-23 DIAGNOSIS — I350 Nonrheumatic aortic (valve) stenosis: Secondary | ICD-10-CM

## 2021-08-23 DIAGNOSIS — I214 Non-ST elevation (NSTEMI) myocardial infarction: Secondary | ICD-10-CM

## 2021-08-23 DIAGNOSIS — S91309A Unspecified open wound, unspecified foot, initial encounter: Secondary | ICD-10-CM

## 2021-08-23 DIAGNOSIS — I5033 Acute on chronic diastolic (congestive) heart failure: Secondary | ICD-10-CM

## 2021-08-23 DIAGNOSIS — I10 Essential (primary) hypertension: Secondary | ICD-10-CM

## 2021-08-23 DIAGNOSIS — E782 Mixed hyperlipidemia: Secondary | ICD-10-CM

## 2021-08-23 DIAGNOSIS — B351 Tinea unguium: Secondary | ICD-10-CM | POA: Diagnosis not present

## 2021-08-23 DIAGNOSIS — I5030 Unspecified diastolic (congestive) heart failure: Secondary | ICD-10-CM | POA: Insufficient documentation

## 2021-08-23 DIAGNOSIS — I739 Peripheral vascular disease, unspecified: Secondary | ICD-10-CM | POA: Diagnosis not present

## 2021-08-23 DIAGNOSIS — N1831 Chronic kidney disease, stage 3a: Secondary | ICD-10-CM

## 2021-08-23 DIAGNOSIS — I6523 Occlusion and stenosis of bilateral carotid arteries: Secondary | ICD-10-CM

## 2021-08-23 DIAGNOSIS — I779 Disorder of arteries and arterioles, unspecified: Secondary | ICD-10-CM | POA: Insufficient documentation

## 2021-08-23 DIAGNOSIS — I5023 Acute on chronic systolic (congestive) heart failure: Secondary | ICD-10-CM | POA: Insufficient documentation

## 2021-08-23 HISTORY — DX: Disorder of arteries and arterioles, unspecified: I77.9

## 2021-08-23 MED ORDER — CEPHALEXIN 500 MG PO CAPS: 500.0000 mg | ORAL_CAPSULE | Freq: Two times a day (BID) | ORAL | 0 refills | Status: DC

## 2021-08-23 MED ORDER — FUROSEMIDE 40 MG PO TABS: 40.0000 mg | ORAL_TABLET | Freq: Every day | ORAL | 3 refills | Status: DC

## 2021-08-23 MED ORDER — POTASSIUM CHLORIDE CRYS ER 20 MEQ PO TBCR: 20.0000 meq | EXTENDED_RELEASE_TABLET | Freq: Every day | ORAL | 3 refills | Status: DC

## 2021-08-23 NOTE — Assessment & Plan Note (Signed)
Mild by recent echocardiogram with mean gradient 9 mmHg.

## 2021-08-23 NOTE — Assessment & Plan Note (Signed)
Obtain follow-up BMET today.  Consider adding ACE/ARB.

## 2021-08-23 NOTE — Patient Instructions (Signed)
Medication Instructions:   CHANGE Asprin after May 1 to one tablet by mouth ( 81 mg ) daily.  START KEFELX one tablet by mouth ( 500 mg) twice daily X 7 days.   *If you need a refill on your cardiac medications before your next appointment, please call your pharmacy*   Lab Work:  TODAY!!!!!  BMET/CBC  Your physician recommends that you return for a FASTING lipid profile/CMET. You can come in on the day of your appointment anytime between 7:30-4:30 fasting from midnight the night before.    If you have labs (blood work) drawn today and your tests are completely normal, you will receive your results only by: [place] (if you have MyChart) OR A paper copy in the mail If you have any lab test that is abnormal or we need to change your treatment, we will call you to review the results.   Testing/Procedures:  None ordered.    Follow-Up: At [facility], you and your health needs are our priority.  As part of our continuing mission to provide you with exceptional heart care, we have created designated Provider Care Teams.  These Care Teams include your primary Cardiologist (physician) and Advanced Practice Providers (APPs -  Physician Assistants and Nurse Practitioners) who all work together to provide you with the care you need, when you need it.  We recommend signing up for the patient portal called "MyChart".  Sign up information is provided on this After Visit Summary.  MyChart is used to connect with patients for Virtual Visits (Telemedicine).  Patients are able to view lab/test results, encounter notes, upcoming appointments, etc.  Non-urgent messages can be sent to your provider as well.   To learn more about what you can do with MyChart, go to [email or web address].    Your next appointment:   8 week(s)  The format for your next appointment:   In Person  Provider:   Mertie Moores, MD     Other Instructions  You have been referred to Podiatry  You have been  referred to wound center.    Please call Home Health for arrange PT/RN.   Check BP once daily and call with readings in 1 week.

## 2021-08-23 NOTE — Assessment & Plan Note (Signed)
His pre-CABG Dopplers demonstrated a right ABI of 0.65 and a left ABI of 0.82.  He does not recall having any symptoms of claudication.  I do not think that the ulcer on his left foot is related to his PAD.

## 2021-08-23 NOTE — Assessment & Plan Note (Signed)
I reviewed his notes from the hospital.  He had a blister at one point.  He has an ulcer that remains that is quite painful for him.  It is limiting his mobility.  We dressed his wound in the office today.  I will refer him to the wound clinic.  There is some surrounding erythema which has improved with cephalexin.  I will extend cephalexin 500 mg twice daily for 7 more days.  I have advised him to try to keep his wound dry as much as possible.  He had been soaking his feet in Epsom salts.  I have asked him to stop doing this for now.

## 2021-08-23 NOTE — Assessment & Plan Note (Signed)
Continue rosuvastatin 20 mg daily.  Obtain fasting CMET, lipids in 3 months.

## 2021-08-23 NOTE — Assessment & Plan Note (Signed)
S/p inferior NSTEMI tx with CABG.  From a cardiac standpoint, he seems to be progressing well.  However, his mobility is limited by his foot wound.  He is using a walker but has difficulty despite this.  He has a referral for HHPT, but he has not called them to set this up yet.  He will not be ready for outpatient cardiac rehab until his foot wound is better and he has completed home health PT.  He is only on ASA and not dual antiplatelet Rx post MI.  I reviewed his chart and could not find a reason why he is not on Plavix.  His platelet counts were somewhat low at one point.  Perhaps Plavix was held due to thrombocytopenia.  I will recheck a CBC today and reviewed with Dr. Acie Fredrickson.  Continue aspirin 325 mg daily, metoprolol tartrate 25 mg twice daily, rosuvastatin 20 mg daily  Obtain BMET, CBC today  Consider changing ASA to 81 and adding Plavix (pending CBC results and review with Dr. Acie Fredrickson)  Follow-up with cardiothoracic surgery as planned  Follow-up in 6 to 8 weeks

## 2021-08-23 NOTE — Progress Notes (Addendum)
Cardiology Office Note:    Date:  08/23/2021   ID:  Danny [name], DOB 14-Aug-1933, MRN [id number]  PCP:  Hoyt Koch, MD  [facility] Providers Cardiologist:  Mertie Moores, MD    Referring MD: Hoyt Koch, *   Chief Complaint:  Hospitalization Follow-up (NSTEMI >> s/p CABG)    Patient Profile: Coronary artery disease Inf NSTEMI s/p CABG in 07/2021 Ischemic CM EF 40-45 by LV gram 1/23 Echocardiogram 1/23: EF 55-60, inf AK Aortic stenosis  Mild - Echo 1/23: Mean gradient 9 mmHg Carotid artery disease [place] 4/12: RICA 87-86; LICA 7-67 Peripheral arterial disease ABIs 1/23: R 0.65; L 0.82 Hypertension  Hyperlipidemia  Myalgias with Atorvastatin  Chronic kidney disease  History of CVA (MRI 1/23: Chronic lacunar infarct left thalamus) Hx of syncope GERD ED  Prior CV Studies: UE venous duplex 08/08/2021 Acute superficial vein thrombosis left cephalic vein  Pre-CABG Dopplers 07/28/2021 R ICA 40-59; L ICA 1-39 R ABI 0.65; L ABI 0.82  Echocardiogram 07/28/2021 Inferior AK, mild aortic stenosis (mean 9 mmHg, V-max 210.5 cm/s, DI 0.70), EF 55-60, GR 1 DD, normal RVSF, normal PASP, trivial MR, mild AI, borderline dilation of aortic root (39 mm)  Cardiac catheterization 07/27/2021 RCA proximal 100 LCx mid 90; OM1 90 LAD mid 85; D1 95, D2 80, 85 EF 40-45, inferior HK    History of Present Illness:   Danny [name] is a 88 y.o. male with the above problem list.  He was last seen in clinic in 4/22 by Dr. Acie Fredrickson.    He was recently admitted 1/18-2/1 with a non-STEMI.  Cardiac catheterization demonstrated three-vessel CAD and reduced EF 40-45% with inferior hypokinesis (EF 55-60 on echocardiogram with inf AK).  He underwent CABG with RIMA-LAD, LIMA-OM1, SVG-OM 2 and bilateral endoscopic vein harvesting with Dr. Cyndia Bent.  Postop course was notable for AKI.  He also had some left arm swelling with evidence of superficial thrombophlebitis on venous duplex.  He was  treated with prophylactic antibiotics (cephalexin).  He returns for follow-up.  He is here with his neighbor.  He has had significant pain in his left foot since he was in the hospital.  He notes a wound that has been open.  There was some redness surrounding this and it has decreased.  He has finished his cephalexin.  The wound on his foot is very painful.  It makes it difficult for him to walk and get his shoes on.  He has not had any fevers.  He has not had chest soreness.  He has not had significant shortness of breath.  He has not had orthopnea.  He continues to have leg edema.  He has not had syncope.  He is not checking his blood pressures at home.  His appetite is good.  He is having normal BMs.    Past Medical History:  Diagnosis Date   (HFpEF) heart failure with preserved ejection fraction ([place])    Echo 1/23: Inferior AK, mild aortic stenosis (mean 9 mmHg, V-max 210.5 cm/s, DI 0.70), EF 55-60, GR 1 DD, normal RVSF, normal PASP, trivial MR, mild AI, borderline dilation of aortic root (39 mm)   Acid reflux    hiatal hernia   CAD (coronary artery disease)    S/p non-STEMI 1/23 >> s/p CABG   Carotid artery disease ([place]) 08/23/2021   Pre-CABG Dopplers 1/23:R ICA 40-59; L ICA 1-39   Chronic kidney disease (CKD)    Diverticulitis    Erectile dysfunction 04/20/2015  Essential hypertension 04/20/2015   History of colon polyps    History of stroke    Noted on brain MRI 07/2021   Hyperlipidemia 05/28/2015   PAD (peripheral artery disease) ([place])    Pre-CABG Dopplers 1/23: Right ABI 0.65; left ABI 0.82   Current Medications: Current Meds  Medication Sig   aspirin EC 325 MG EC tablet Take 1 tablet (325 mg total) by mouth daily.   cephALEXin (KEFLEX) 500 MG capsule Take 500 mg by mouth 2 (two) times daily.   cephALEXin (KEFLEX) 500 MG capsule Take 1 capsule (500 mg total) by mouth 2 (two) times daily.   melatonin 3 MG TABS tablet Take 1 tablet (3 mg total) by mouth at bedtime.   metoprolol  tartrate (LOPRESSOR) 25 MG tablet Take 1 tablet (25 mg total) by mouth 2 (two) times daily.   rosuvastatin (CRESTOR) 20 MG tablet Take 1 tablet (20 mg total) by mouth daily.   [DISCONTINUED] furosemide (LASIX) 40 MG tablet Take 1 tablet (40 mg total) by mouth daily.   [DISCONTINUED] potassium chloride SA (KLOR-CON M) 20 MEQ tablet Take 1 tablet (20 mEq total) by mouth daily.    Allergies:   Lipitor [atorvastatin]   Social History   Tobacco Use   Smoking status: Former    Types: Cigars   Smokeless tobacco: Never  Vaping Use   Vaping Use: Never used  Substance Use Topics   Alcohol use: No   Drug use: No    Family Hx: The patient's family history includes Stroke (age of onset: 25) in his father; Sudden death (age of onset: 93) in his mother.  Review of Systems  Gastrointestinal:  Negative for hematochezia.  Genitourinary:  Negative for hematuria.    EKGs/Labs/Other Test Reviewed:    EKG:  EKG is   ordered today.  The ekg ordered today demonstrates NSR, HR 75, inferior and anteroseptal Q waves, inferolateral T wave inversions, QTc 439  Recent Labs: 07/27/2021: ALT 17; B Natriuretic Peptide 51.3; TSH 1.029 08/02/2021: Magnesium 2.7 08/07/2021: Hemoglobin 9.6; Platelets 219 08/10/2021: BUN 34; Creatinine, Ser 1.36; Potassium 4.1; Sodium 135   Recent Lipid Panel Recent Labs    07/28/21 0301  CHOL 162  TRIG 54  HDL 57  VLDL 11  LDLCALC 94     Risk Assessment/Calculations:         Physical Exam:    VS:  BP (!) 152/62 (BP Location: Left Arm, Patient Position: Sitting, Cuff Size: Normal)    Pulse 75    Ht _0  (1.626 m)    Wt 148 lb 9.6 oz (67.4 kg)    SpO2 97%    BMI 25.51 kg/m     Wt Readings from Last 3 Encounters:  08/23/21 148 lb 9.6 oz (67.4 kg)  08/10/21 158 lb 12.8 oz (72 kg)  10/25/20 155 lb 9.6 oz (70.6 kg)    Constitutional:      Appearance: Healthy appearance. Not in distress.  Neck:     Vascular: No JVR. JVD normal.  Pulmonary:     Effort: Pulmonary  effort is normal.     Breath sounds: No wheezing. No rales.  Cardiovascular:     Normal rate. Regular rhythm. Normal S1. Normal S2.      Murmurs: There is a grade 2/6 systolic murmur at the URSB.  Edema:    Peripheral edema present.    Pretibial: bilateral 1+ edema of the pretibial area.    Ankle: bilateral 2+ edema of the ankle. Abdominal:  Palpations: Abdomen is soft.  Musculoskeletal:       Feet: Skin:    General: Skin is warm and dry.  Feet:     Left foot:     Skin integrity: Ulcer present.     Toenail Condition: Left toenails are abnormally thick.  Neurological:     General: No focal deficit present.     Mental Status: Alert and oriented to person, place and time.     Cranial Nerves: Cranial nerves are intact.         ASSESSMENT & PLAN:   NSTEMI (non-ST elevated myocardial infarction) ([place]) S/p inferior NSTEMI tx with CABG.  From a cardiac standpoint, he seems to be progressing well.  However, his mobility is limited by his foot wound.  He is using a walker but has difficulty despite this.  He has a referral for HHPT, but he has not called them to set this up yet.  He will not be ready for outpatient cardiac rehab until his foot wound is better and he has completed home health PT.  He is only on ASA and not dual antiplatelet Rx post MI.  I reviewed his chart and could not find a reason why he is not on Plavix.  His platelet counts were somewhat low at one point.  Perhaps Plavix was held due to thrombocytopenia.  I will recheck a CBC today and reviewed with Dr. Acie Fredrickson. Continue aspirin 325 mg daily, metoprolol tartrate 25 mg twice daily, rosuvastatin 20 mg daily Obtain BMET, CBC today Consider changing ASA to 81 and adding Plavix (pending CBC results and review with Dr. Acie Fredrickson) Follow-up with cardiothoracic surgery as planned Follow-up in 6 to 8 weeks  (HFpEF) heart failure with preserved ejection fraction ([place]) Echocardiogram demonstrated normal LV function with mild  diastolic dysfunction.  He continues to display signs of volume excess.  I will continue him on furosemide 40 mg daily and potassium 20 mEq daily.  Obtain follow-up BMET today.  Aortic stenosis Mild by recent echocardiogram with mean gradient 9 mmHg.  Essential hypertension Blood pressure somewhat elevated.  This may be increased in the setting of pain from his foot.  I will have him monitor his blood pressure over the next week and call in his blood pressure readings.  If his renal function is stable and his blood pressures remain elevated, consider adding ACE/ARB.  Hyperlipidemia Continue rosuvastatin 20 mg daily.  Obtain fasting CMET, lipids in 3 months.  Onychomycosis He has significantly thickened toenails on the left foot.  This is difficult for him to maintain.  I will refer him to podiatry.  Open wound of dorsum of foot I reviewed his notes from the hospital.  He had a blister at one point.  He has an ulcer that remains that is quite painful for him.  It is limiting his mobility.  We dressed his wound in the office today.  I will refer him to the wound clinic.  There is some surrounding erythema which has improved with cephalexin.  I will extend cephalexin 500 mg twice daily for 7 more days.  I have advised him to try to keep his wound dry as much as possible.  He had been soaking his feet in Epsom salts.  I have asked him to stop doing this for now.  CKD (chronic kidney disease) stage 3, GFR 30-59 ml/min (HCC) Obtain follow-up BMET today.  Consider adding ACE/ARB.  PAD (peripheral artery disease) ([place]) His pre-CABG Dopplers demonstrated a right ABI of  0.65 and a left ABI of 0.82.  He does not recall having any symptoms of claudication.  I do not think that the ulcer on his left foot is related to his PAD.  Carotid artery disease (HCC) Pre-CABG Dopplers demonstrated 40-59% right ICA stenosis and 1-39% left ICA stenosis.  Continue aspirin, statin.  He will need to follow-up carotid  Dopplers in 1 year.     Cardiac Rehabilitation Eligibility Assessment  The patient is NOT ready to start cardiac rehabilitation due to: The patient is non-ambulatory or cannot walk a total of 30 minutes in 1 day. (He has a wound on his foot and needs to finish with HHPT)         Dispo:  Return in about 8 weeks (around 10/18/2021) for Routine follow up in 8 weeks with Dr.Nahser. .   Medication Adjustments/Labs and Tests Ordered: Current medicines are reviewed at length with the patient today.  Concerns regarding medicines are outlined above.  Tests Ordered: Orders Placed This Encounter  Procedures   Basic Metabolic Panel (BMET)   CBC   Comp Met (CMET)   Lipid Profile   AMB referral to wound care center   Ambulatory referral to Podiatry   EKG 12-Lead   Medication Changes: Meds ordered this encounter  Medications   cephALEXin (KEFLEX) 500 MG capsule    Sig: Take 1 capsule (500 mg total) by mouth 2 (two) times daily.    Dispense:  14 capsule    Refill:  0   furosemide (LASIX) 40 MG tablet    Sig: Take 1 tablet (40 mg total) by mouth daily.    Dispense:  90 tablet    Refill:  3   potassium chloride SA (KLOR-CON M) 20 MEQ tablet    Sig: Take 1 tablet (20 mEq total) by mouth daily.    Dispense:  90 tablet    Refill:  3   Signed, [name], PA-C  08/23/2021 4:47 PM    [facility] [place], [place], [place]  [address] Phone: [phone]; Fax: [phone]

## 2021-08-23 NOTE — Assessment & Plan Note (Signed)
Pre-CABG Dopplers demonstrated 40-59% right ICA stenosis and 1-39% left ICA stenosis.  Continue aspirin, statin.  He will need to follow-up carotid Dopplers in 1 year.

## 2021-08-23 NOTE — Assessment & Plan Note (Signed)
Echocardiogram demonstrated normal LV function with mild diastolic dysfunction.  He continues to display signs of volume excess.  I will continue him on furosemide 40 mg daily and potassium 20 mEq daily.  Obtain follow-up BMET today.

## 2021-08-23 NOTE — Assessment & Plan Note (Signed)
Blood pressure somewhat elevated.  This may be increased in the setting of pain from his foot.  I will have him monitor his blood pressure over the next week and call in his blood pressure readings.  If his renal function is stable and his blood pressures remain elevated, consider adding ACE/ARB.

## 2021-08-23 NOTE — Assessment & Plan Note (Signed)
He has significantly thickened toenails on the left foot.  This is difficult for him to maintain.  I will refer him to podiatry.

## 2021-08-24 LAB — CBC
Hematocrit: 27.6 % — ABNORMAL LOW (ref 37.5–51.0)
Hemoglobin: 9.4 g/dL — ABNORMAL LOW (ref 13.0–17.7)
MCH: 31.3 pg (ref 26.6–33.0)
MCHC: 34.1 g/dL (ref 31.5–35.7)
MCV: 92 fL (ref 79–97)
Platelets: 281 10*3/uL (ref 150–450)
RBC: 3 x10E6/uL — ABNORMAL LOW (ref 4.14–5.80)
RDW: 14 % (ref 11.6–15.4)
WBC: 5.8 10*3/uL (ref 3.4–10.8)

## 2021-08-24 LAB — BASIC METABOLIC PANEL
BUN/Creatinine Ratio: 19 (ref 10–24)
BUN: 28 mg/dL — ABNORMAL HIGH (ref 8–27)
CO2: 24 mmol/L (ref 20–29)
Calcium: 9.1 mg/dL (ref 8.6–10.2)
Chloride: 97 mmol/L (ref 96–106)
Creatinine, Ser: 1.48 mg/dL — ABNORMAL HIGH (ref 0.76–1.27)
Glucose: 98 mg/dL (ref 70–99)
Potassium: 4.5 mmol/L (ref 3.5–5.2)
Sodium: 136 mmol/L (ref 134–144)
eGFR: 46 mL/min/{1.73_m2} — ABNORMAL LOW (ref >59)

## 2021-08-25 ENCOUNTER — Telehealth: Payer: Self-pay | Admitting: *Deleted

## 2021-08-25 NOTE — Telephone Encounter (Signed)
I placed call to patient but was unable to leave a message as voicemail is not set up

## 2021-08-25 NOTE — Telephone Encounter (Signed)
-----   Message from Liliane Shi, PA-C sent at 08/25/2021  7:35 AM EST ----- Please try to reach patient on 08/25/2021 to go over med changes/instructions.  Recommendations from 08/24/21: Creatinine remains elevated but stable. K+, platelet count normal. Hemoglobin remains low. I reviewed with Dr. Acie Fredrickson and we agree that he should be on Plavix in addition to ASA. He can take Iron for a little while to help boost his Hgb. PLAN:  -Decrease Lasix to 20 mg MWF and 40 mg all other days -Decrease K+ to 10 mEq MWF and 20 mEq all other days -Decrease ASA to 81 mg once daily -Start Clopidogrel (Plavix) 75 mg once daily  -Start Ferrous sulfate (OTC) 325 mg once daily or twice daily (depending on his tolerance) x 3 mos, then stop -BMET 2 weeks   [name], PA-C    08/25/2021 7:34 AM

## 2021-08-26 ENCOUNTER — Telehealth: Payer: Self-pay | Admitting: Physician Assistant

## 2021-08-26 NOTE — Telephone Encounter (Signed)
Patient is calling about his foot.  He states Danny [name] saw if the other day and was setting he up with the wound center.  Patient thinks it's getting better.  He would like to speak to [name] about it.

## 2021-08-26 NOTE — Telephone Encounter (Signed)
Tried to call pt, but no answer and voicemail box is full. Will reattempt.

## 2021-08-30 NOTE — Telephone Encounter (Signed)
This is the 4th attempt to call pt with different numbers.  No answer on this number rang X 10. The other phone number vm full.  Will send back to triage to attempt later due to being in clinic please. Scott stated if foot is better and pt does not need wound clinic can f/u with PCP.

## 2021-08-31 ENCOUNTER — Encounter: Payer: Self-pay | Admitting: *Deleted

## 2021-08-31 NOTE — Telephone Encounter (Signed)
Attempted to call pt again with no answer and voicemail has not been set up. Wife is on DPR, but same contact number. Attempted daughter listed on DPR, but no answer and voicemail box is full. This makes approx 11 times over 5 days to relay the recommendation of [name] below:  Danny [name]     10:05 AM Note This is the 4th attempt to call pt with different numbers.  No answer on this number rang X 10. The other phone number vm full.  Will send back to triage to attempt later due to being in clinic please. Scott stated if foot is better and pt does not need wound clinic can f/u with PCP.     Pt's appt with wound care is scheduled for 2/28. At this point, we will leave in pt's hands to continue the wound care appt if desired

## 2021-09-06 ENCOUNTER — Other Ambulatory Visit: Payer: Self-pay | Admitting: Surgery

## 2021-09-06 ENCOUNTER — Encounter: Payer: Self-pay | Admitting: Podiatry

## 2021-09-06 ENCOUNTER — Other Ambulatory Visit: Payer: Self-pay

## 2021-09-06 ENCOUNTER — Ambulatory Visit: Payer: Medicare PPO | Admitting: Podiatry

## 2021-09-06 DIAGNOSIS — M79674 Pain in right toe(s): Secondary | ICD-10-CM

## 2021-09-06 DIAGNOSIS — I739 Peripheral vascular disease, unspecified: Secondary | ICD-10-CM

## 2021-09-06 DIAGNOSIS — B351 Tinea unguium: Secondary | ICD-10-CM

## 2021-09-06 DIAGNOSIS — M79675 Pain in left toe(s): Secondary | ICD-10-CM

## 2021-09-06 DIAGNOSIS — Z951 Presence of aortocoronary bypass graft: Secondary | ICD-10-CM

## 2021-09-06 NOTE — Progress Notes (Signed)
°  Subjective:  Patient ID: Danny [name], male    DOB: 1934-03-14,   MRN: [id number]  No chief complaint on file.   88 y.o. male presents for concern of thickened elongated and painful nails that are difficult to trim. Requesting to have them trimmed today. Denies any burning or tingling in her feet. He has a history of heart surgery 2 weeks ago and PAD. Relates an area on the top of his left foot that he is to follow-up with wound care in 2 weeks.   . Denies any other pedal complaints. Denies n/v/f/c.   Past Medical History:  Diagnosis Date   (HFpEF) heart failure with preserved ejection fraction ([place])    Echo 1/23: Inferior AK, mild aortic stenosis (mean 9 mmHg, V-max 210.5 cm/s, DI 0.70), EF 55-60, GR 1 DD, normal RVSF, normal PASP, trivial MR, mild AI, borderline dilation of aortic root (39 mm)   Acid reflux    hiatal hernia   CAD (coronary artery disease)    S/p non-STEMI 1/23 >> s/p CABG   Carotid artery disease ([place]) 08/23/2021   Pre-CABG Dopplers 1/23:R ICA 40-59; L ICA 1-39   Chronic kidney disease (CKD)    Diverticulitis    Erectile dysfunction 04/20/2015   Essential hypertension 04/20/2015   History of colon polyps    History of stroke    Noted on brain MRI 07/2021   Hyperlipidemia 05/28/2015   PAD (peripheral artery disease) ([place])    Pre-CABG Dopplers 1/23: Right ABI 0.65; left ABI 0.82    Objective:  Physical Exam: Vascular: DP/PT pulses 1/4 bilateral (edema). CFT <3 seconds. Absent hair growth. Bilateral edema and xerosis noted.  Skin. No lacerations or abrasions bilateral feet. Hyperkeratotic tissue and scabbing noted to dorsum of foot upon debridement no wound noted to be present. Area covered with bandaid at present. Nails 1-5 bilateral are thickened discolored with subungual debris.  Musculoskeletal: MMT 5/5 bilateral lower extremities in DF, PF, Inversion and Eversion. Deceased ROM in DF of ankle joint.  Neurological: Sensation intact to light touch.    Assessment:   1. PAD (peripheral artery disease) (HCC)   2. Pain due to onychomycosis of toenails of both feet      Plan:  Patient was evaluated and treated and all questions answered. -Discussed and educated patient on foot care, especially with  regards to the vascular, neurological and musculoskeletal systems.  -Discussed supportive shoes at all times and checking feet regularly.  -Mechanically debrided all nails 1-5 bilateral using sterile nail nipper and filed with dremel without incident  -Hyperkeratotic lesion on dorsal left foot debrided without incident. Discussed follow-up with wound care if he notices any openeing but at this time wound healed.  -Answered all patient questions -Patient to return  in 3 months for at risk foot care -Patient advised to call the office if any problems or questions arise in the meantime.   Lorenda Peck, DPM

## 2021-09-07 ENCOUNTER — Encounter: Payer: Self-pay | Admitting: Surgery

## 2021-09-07 ENCOUNTER — Ambulatory Visit
Admission: RE | Admit: 2021-09-07 | Discharge: 2021-09-07 | Disposition: A | Discharge status: Home / Self Care | Payer: Medicare PPO | Source: Ambulatory Visit | Attending: Surgery | Admitting: Surgery

## 2021-09-07 ENCOUNTER — Ambulatory Visit (INDEPENDENT_AMBULATORY_CARE_PROVIDER_SITE_OTHER): Payer: Self-pay | Admitting: Surgery

## 2021-09-07 VITALS — BP 140/59 | HR 72 | Resp 20 | Ht 64.0 in | Wt 144.0 lb

## 2021-09-07 DIAGNOSIS — J929 Pleural plaque without asbestos: Secondary | ICD-10-CM | POA: Diagnosis not present

## 2021-09-07 DIAGNOSIS — Z951 Presence of aortocoronary bypass graft: Secondary | ICD-10-CM | POA: Diagnosis not present

## 2021-09-07 DIAGNOSIS — I700 Atherosclerosis of aorta: Secondary | ICD-10-CM | POA: Diagnosis not present

## 2021-09-07 NOTE — Progress Notes (Signed)
? ?  HPI: ?Patient returns for routine postoperative follow-up having undergone coronary bypass graft surgery x3 using bilateral IMA grafts due to limited leg vein on 08/01/2021. ?The patient's early postoperative recovery while in the hospital was notable for an uncomplicated postoperative course. ?Since hospital discharge the patient reports that he has been feeling well.  He is walking daily without chest pain or shortness of breath.. ? ? ?Current Outpatient Medications  ?Medication Sig Dispense Refill  ? aspirin EC 325 MG EC tablet Take 1 tablet (325 mg total) by mouth daily.    ? furosemide (LASIX) 40 MG tablet Take 1 tablet (40 mg total) by mouth daily. 90 tablet 3  ? melatonin 3 MG TABS tablet Take 1 tablet (3 mg total) by mouth at bedtime.  0  ? metoprolol tartrate (LOPRESSOR) 25 MG tablet Take 1 tablet (25 mg total) by mouth 2 (two) times daily. 60 tablet 1  ? potassium chloride SA (KLOR-CON M) 20 MEQ tablet Take 1 tablet (20 mEq total) by mouth daily. 90 tablet 3  ? rosuvastatin (CRESTOR) 20 MG tablet Take 1 tablet (20 mg total) by mouth daily. 30 tablet 1  ? ?No current facility-administered medications for this visit.  ? ? ?Physical Exam: ?BP (!) 140/59   Pulse 72   Resp 20   Ht 5\' 4"  (1.626 m)   Wt 144 lb (65.3 kg)   SpO2 97% Comment: RA  BMI 24.72 kg/m?  ?He looks well. ?Cardiac exam shows a regular rate and rhythm with normal heart sounds. ?Lungs are clear. ?The chest incision is healing well and the sternum is stable. ?His leg incisions are healing well and there is no peripheral edema. ? ?Diagnostic Tests: ? ?Narrative & Impression  ?CLINICAL DATA:  Post CABG. ?  ?EXAM: ?CHEST - 2 VIEW ?  ?COMPARISON:  August 06, 2021 ?  ?FINDINGS: ?Postsurgical changes from CABG. ?  ?Tortuosity and calcific atherosclerotic disease of the aorta. ?  ?Normal cardiac silhouette. ?  ?There is no evidence of focal airspace consolidation, pleural ?effusion or pneumothorax. Minimal residual left pleural thickening. ?   ?Osseous structures are without acute abnormality. Soft tissues are ?grossly normal. ?  ?IMPRESSION: ?No active cardiopulmonary disease. ?  ?Minimal residual left pleural thickening in the costophrenic angle. ?No radiographically apparent pleural effusion is seen. ?  ?  ?Electronically Signed ?  By: Fidela Salisbury M.D. ?  On: 09/07/2021 10:09 ?   ? ? ?Impression: ? ?Overall he is doing well following coronary bypass graft surgery.  I encouraged him to continue walking as much as possible.  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 ? ?Plan: ? ?He will continue to follow-up with Dr. Acie Fredrickson and will return to see me if he has any problems with his incisions. ? ? ?Gaye Pollack, MD ?[facility] ?[phone] ?

## 2021-09-14 ENCOUNTER — Ambulatory Visit (INDEPENDENT_AMBULATORY_CARE_PROVIDER_SITE_OTHER): Payer: Medicare PPO

## 2021-09-14 VITALS — BP 130/64 | HR 58

## 2021-09-14 DIAGNOSIS — I214 Non-ST elevation (NSTEMI) myocardial infarction: Secondary | ICD-10-CM | POA: Diagnosis not present

## 2021-09-14 DIAGNOSIS — R7989 Other specified abnormal findings of blood chemistry: Secondary | ICD-10-CM

## 2021-09-14 MED ORDER — METOPROLOL TARTRATE 25 MG PO TABS: 25.0000 mg | ORAL_TABLET | Freq: Two times a day (BID) | ORAL | 1 refills | Status: DC

## 2021-09-14 MED ORDER — CLOPIDOGREL BISULFATE 75 MG PO TABS: 75.0000 mg | ORAL_TABLET | Freq: Every day | ORAL | 3 refills | Status: DC

## 2021-09-14 MED ORDER — ASPIRIN EC 81 MG PO TBEC: 81.0000 mg | DELAYED_RELEASE_TABLET | Freq: Every day | ORAL | 3 refills | Status: DC

## 2021-09-14 MED ORDER — FUROSEMIDE 40 MG PO TABS: 40.0000 mg | ORAL_TABLET | Freq: Every day | ORAL | 3 refills | Status: DC

## 2021-09-14 MED ORDER — POTASSIUM CHLORIDE CRYS ER 20 MEQ PO TBCR: 20.0000 meq | EXTENDED_RELEASE_TABLET | Freq: Every day | ORAL | 3 refills | Status: DC

## 2021-09-14 NOTE — Progress Notes (Signed)
? ?  Nurse Visit  ?  ?Date of Encounter: 09/14/2021 ?ID: AVRIAN DELFAVERO, DOB 04-16-1934, MRN [id number] ? ?PCP:  Hoyt Koch, MD ?  ?Cardiologist:  Mertie Moores, MD  ?Advanced Practice Provider:  [name], PA ? ?Visit Details  ? ?VS:  BP 130/64   Pulse (!) 58  , BMI There is no height or weight on file to calculate BMI. ? ?Wt Readings from Last 3 Encounters:  ?09/07/21 144 lb (65.3 kg)  ?08/23/21 148 lb 9.6 oz (67.4 kg)  ?08/10/21 158 lb 12.8 oz (72 kg)  ?  ? ?Reason for visit: BP check/Medication Management ?Performed today: Vitals, Provider consulted: [name], Hinton Lovely, DOD, and Education ?Changes (medications, testing, etc.) : Medications, Labs ?Length of Visit: 45 minutes ? ?Medications Adjustments/Labs and Tests Ordered: ?Orders Placed This Encounter  ?Procedures  ? Basic metabolic panel  ? CBC  ? ?Meds ordered this encounter  ?Medications  ? aspirin EC 81 MG tablet  ?  Sig: Take 1 tablet (81 mg total) by mouth daily. Swallow whole.  ?  Dispense:  90 tablet  ?  Refill:  3  ? clopidogrel (PLAVIX) 75 MG tablet  ?  Sig: Take 1 tablet (75 mg total) by mouth daily.  ?  Dispense:  90 tablet  ?  Refill:  3  ? furosemide (LASIX) 40 MG tablet  ?  Sig: Take 1 tablet (40 mg total) by mouth daily.  ?  Dispense:  90 tablet  ?  Refill:  3  ? potassium chloride SA (KLOR-CON M) 20 MEQ tablet  ?  Sig: Take 1 tablet (20 mEq total) by mouth daily.  ?  Dispense:  90 tablet  ?  Refill:  3  ? metoprolol tartrate (LOPRESSOR) 25 MG tablet  ?  Sig: Take 1 tablet (25 mg total) by mouth 2 (two) times daily.  ?  Dispense:  60 tablet  ?  Refill:  1  ? ?Patient walked in to the office today as he received a certified letter in the mail letting him know that we had been trying to contact him since 2/15 with his lab results and multiple medication changes/recommendations. Patient agreed to nurse visit. Patient denies chest pain, SOB, swelling or any other Sx at this time. VSS. Patient states that he ran out of his  lasix, potassium crestor, and metoprolol this AM and is requesting refills. Reviewed with [name], PA and since patient's labs were from almost a month ago we will recheck CBC, and BMET and wait on making any changes to his lasix and potassium until after results come back. Patient to start ASA 81 mg QD and plavix 75 mg QD. Will hold off on starting Ferrous sulfate for now. Reviewed all medication changes and instructions, sent in refills, and instructed patient to keep appointment with Dr. Acie Fredrickson on 10/12/21.  Made patient aware that we will be calling him with his lab results and recommendations in the next day or so. Phone numbers on file verified as accurate. Patient verbalized understanding.  ?Signed, ?Cleon Gustin, RN  ?09/14/2021 4:35 PM ? ? ? ? ?

## 2021-09-14 NOTE — Patient Instructions (Addendum)
Medication Instructions:  ?Your physician has recommended you make the following change in your medication:  ? ?STOP: Aspirin 325 mg tablet ?START: Asprin 81 mg enteric coated tablet, Take 1 tablet by mouth once a day ?START: clopidogrel (plavix) 75 mg tablet: Take 1 tablet by mouth once a day ?CONTINUE: furosemide (lasix) 40 mg once a day ?CONTINUE: potassium 20 mEQ once a day ?Refill sent in for Rosuvastatin (Crestor) 20 mg tablet: Take 1 tablet once a day ?Refill sent in for metoprolol 25 mg tablet: Take 1 tablet by mouth twice a day ? ?*If you need a refill on your cardiac medications before your next appointment, please call your pharmacy* ? ? ?Lab Work: ?TODAY: CBC, BMET ? ?If you have labs (blood work) drawn today and your tests are completely normal, you will receive your results only by: ?MyChart Message (if you have MyChart) OR ?A paper copy in the mail ?If you have any lab test that is abnormal or we need to change your treatment, we will call you to review the results. ? ? ?Testing/Procedures: ?None  ? ? ?Follow-Up: ?Keep follow up appointment with Dr. Acie Fredrickson on 10/12/21 at 1:20 PM ?

## 2021-09-15 ENCOUNTER — Encounter (HOSPITAL_BASED_OUTPATIENT_CLINIC_OR_DEPARTMENT_OTHER): Payer: Medicare PPO | Admitting: General Surgery

## 2021-09-15 ENCOUNTER — Telehealth: Payer: Self-pay | Admitting: Cardiovascular Disease

## 2021-09-15 DIAGNOSIS — E875 Hyperkalemia: Secondary | ICD-10-CM

## 2021-09-15 LAB — BASIC METABOLIC PANEL
BUN/Creatinine Ratio: 25 — ABNORMAL HIGH (ref 10–24)
BUN: 44 mg/dL — ABNORMAL HIGH (ref 8–27)
CO2: 22 mmol/L (ref 20–29)
Calcium: 9.4 mg/dL (ref 8.6–10.2)
Chloride: 103 mmol/L (ref 96–106)
Creatinine, Ser: 1.78 mg/dL — ABNORMAL HIGH (ref 0.76–1.27)
Glucose: 92 mg/dL (ref 70–99)
Potassium: 5.8 mmol/L (ref 3.5–5.2)
Sodium: 139 mmol/L (ref 134–144)
eGFR: 37 mL/min/{1.73_m2} — ABNORMAL LOW (ref >59)

## 2021-09-15 LAB — CBC
Hematocrit: 30.1 % — ABNORMAL LOW (ref 37.5–51.0)
Hemoglobin: 9.9 g/dL — ABNORMAL LOW (ref 13.0–17.7)
MCH: 31 pg (ref 26.6–33.0)
MCHC: 32.9 g/dL (ref 31.5–35.7)
MCV: 94 fL (ref 79–97)
Platelets: 236 10*3/uL (ref 150–450)
RBC: 3.19 x10E6/uL — ABNORMAL LOW (ref 4.14–5.80)
RDW: 14.9 % (ref 11.6–15.4)
WBC: 7.4 10*3/uL (ref 3.4–10.8)

## 2021-09-15 NOTE — Telephone Encounter (Signed)
Spoke with patient and discussed potassium level and Dr. Macky Lower recommendation to stop potassium supplement. ? ?Potassium chloride 49mq discontinued. ? ?Patient scheduled for repeat BMET on 09/22/21. ? ?Patient verbalized understanding and agrees with plan above. ?

## 2021-09-15 NOTE — Telephone Encounter (Signed)
Santiago Glad from [facility] calling with critical lab results. ?

## 2021-09-15 NOTE — Telephone Encounter (Signed)
Received call from Santiago Glad at [facility] with critical potassium of 5.8.  ?Pt was seen in office yesterday via nurse visit for follow-up on recommendations from last OV with [name] on 08/24/21. Scott had advised to make several medication changes, but due to length from visit until now, new blood work was ordered. Labs from yesterday resulted showing critical K+ of 5.8 (previous was 4.5 on 2/14).  ?Went to [facility] with results (primary Cardiologist and Kathlen Mody are both out of office today) and received below recommendations: ? ?STOP potassium supplementation altogether effective immediately ?REPEAT BMET in 1 week ? ?Attempted to call patient at both numbers on file with no answer. Pt verified yesterday that the primary number on file was his best contact. Will continue to try and reach patient with above information. ?

## 2021-09-22 ENCOUNTER — Other Ambulatory Visit: Payer: Self-pay

## 2021-09-22 ENCOUNTER — Other Ambulatory Visit: Payer: Medicare PPO

## 2021-09-22 DIAGNOSIS — E875 Hyperkalemia: Secondary | ICD-10-CM | POA: Diagnosis not present

## 2021-09-22 LAB — BASIC METABOLIC PANEL
BUN/Creatinine Ratio: 27 — ABNORMAL HIGH (ref 10–24)
BUN: 35 mg/dL — ABNORMAL HIGH (ref 8–27)
CO2: 25 mmol/L (ref 20–29)
Calcium: 9.3 mg/dL (ref 8.6–10.2)
Chloride: 104 mmol/L (ref 96–106)
Creatinine, Ser: 1.31 mg/dL — ABNORMAL HIGH (ref 0.76–1.27)
Glucose: 86 mg/dL (ref 70–99)
Potassium: 4.4 mmol/L (ref 3.5–5.2)
Sodium: 141 mmol/L (ref 134–144)
eGFR: 53 mL/min/{1.73_m2} — ABNORMAL LOW (ref >59)

## 2021-09-23 ENCOUNTER — Telehealth: Payer: Self-pay

## 2021-09-23 MED ORDER — ROSUVASTATIN CALCIUM 20 MG PO TABS: 20.0000 mg | ORAL_TABLET | Freq: Every day | ORAL | 3 refills | Status: DC

## 2021-09-23 NOTE — Telephone Encounter (Signed)
-----   Message from Judie Grieve sent at 09/22/2021  9:32 AM EDT ----- ?Regarding: Crestor ?Good morning all, ? ?Patient states he got a refill of everything except Crestor, which he is now out of.  He was wondering if we were waiting on the results of a test before refilling? ? ?He said either phone on file would be a good contact. ? ?Thank you! ? ? ?

## 2021-09-23 NOTE — Telephone Encounter (Signed)
Per OV note by [name] on 08/23/21: ?Continue aspirin 325 mg daily, metoprolol tartrate 25 mg twice daily, rosuvastatin 20 mg daily ? ?Refill sent in to pharmacy ?

## 2021-10-03 ENCOUNTER — Telehealth (HOSPITAL_COMMUNITY): Payer: Self-pay

## 2021-10-03 ENCOUNTER — Encounter (HOSPITAL_COMMUNITY): Payer: Self-pay

## 2021-10-03 NOTE — Telephone Encounter (Signed)
Attempted to call patient in regards to Cardiac Rehab - unable to leave VM.   Mailed letter 

## 2021-10-11 ENCOUNTER — Encounter: Payer: Self-pay | Admitting: Cardiovascular Disease

## 2021-10-11 NOTE — Progress Notes (Signed)
? ?Cardiology Office Note ? ? ?Date:  10/12/2021  ? ?ID:  Danny [name], DOB Dec 02, 1933, MRN [id number] ? ?PCP:  Hoyt Koch, MD  ?Cardiologist:   Mertie Moores, MD  ? ?Chief Complaint  ?Patient presents with  ? Coronary Artery Disease  ?   ?  ? Hypertension  ?   ?  ? ?Problem List ? ?1. HTN ?2. Hyperlipidemia ? ?  ?Danny [name] is a 88 y.o. male who presents to re-establish care. ?I saw him many years ago . ?Has a hx of HTN and hyperlipidema.  ?Mows yards, does lots of yard work .  ? ?September 17, 2015: ? ?BP has been running high. ?Does not eat salt. ?Hb has been falling - was 12.8 ? ?Sept. 19, 2017: ? ?Staying active.  ?BP and HR are well controlled . ?Hx of HTN and hyperlipidemia .  ?Still anemic.   ? ?March 21, 2017: ?BP is elevated .  Has not had his HCTZ for the past month or so  ?Still eating salty foods.  ?  ?March 19, 2018: Patient is doing much better.  He is taking all his medications.  He has been trying to avoid eating so much salt. ? ?September 16, 2018: ?Danny [name] is seen again for follow up of his HTN and hyperlipidemia. ?Takes his meds regularly . ? ?In Dec. 2019, he was sitting on the commode and had a presyncopal / syncope episode  ?The episode was associated with an episode of diarrhea .  ?Does not sound cardiac  ?Has been exercising regularly since that tmie  ?Labs from primary MD ?Chol = 156 ?HDL = 60 ?LDL = 82 ?Trigs = 82  ? ?September 16, 2019 ?Danny [name] is seen for follow up of his syncope / vasovagal syncope ? ?No cp or dyspnea.  No recent syncope . ?Stays active.    Working out daily ,  Exercises. , is currently paitnting the living room .  ?VS looks ? ?October 25, 2020 ? ?Danny [name] is seen for follow up of his syncope / vasovagle syncope . ?Stressed out currently . ?Wife fell and broke her hip.   Spent some time at [employer] , is not at home  ?Has not been measuring his BP at home  ? ?Has mild AS  ? ? ?October 12, 2021: ?Danny [name] is seen for follow up of his syncope ?Has had CABG since last year  ?No  cp,  ?Is walking regularly ,  is gradually increasing  ?1 mile or so now ,  plans on extending it ,   ?Has to care for his wife who is ill  ?His last LDL is 94. ?On rosuvastatin 20 mg a day ,  to be rechecked in a month  ? ?Past Medical History:  ?Diagnosis Date  ? (HFpEF) heart failure with preserved ejection fraction ([place])   ? Echo 1/23: Inferior AK, mild aortic stenosis (mean 9 mmHg, V-max 210.5 cm/s, DI 0.70), EF 55-60, GR 1 DD, normal RVSF, normal PASP, trivial MR, mild AI, borderline dilation of aortic root (39 mm)  ? Acid reflux   ? hiatal hernia  ? CAD (coronary artery disease)   ? S/p non-STEMI 1/23 >> s/p CABG  ? Carotid artery disease ([place]) 08/23/2021  ? Pre-CABG Dopplers 1/23:R ICA 40-59; L ICA 1-39  ? Chronic kidney disease (CKD)   ? Diverticulitis   ? Erectile dysfunction 04/20/2015  ? Essential hypertension 04/20/2015  ? History of colon polyps   ? History of stroke   ?  Noted on brain MRI 07/2021  ? Hyperlipidemia 05/28/2015  ? PAD (peripheral artery disease) ([place])   ? Pre-CABG Dopplers 1/23: Right ABI 0.65; left ABI 0.82  ? ? ?Past Surgical History:  ?Procedure Laterality Date  ? APPENDECTOMY    ? cataract surgery    ? CORONARY ARTERY BYPASS GRAFT N/A 08/01/2021  ? Procedure: CORONARY ARTERY BYPASS GRAFTING (CABG) X 3  ,ON PUMP, USING LEFT AND RIGHT INTERNAL MAMMARY ARTERIES, RIGHT AND LEFT ENDOSCOPIC GREATER SAPHENOUS VEIN CONDUITS;  Surgeon: Gaye Pollack, MD;  Location: [facility];  Service: Open Heart Surgery;  Laterality: N/A;  ? ENDOVEIN HARVEST OF GREATER SAPHENOUS VEIN Right 08/01/2021  ? Procedure: ENDOVEIN HARVEST OF GREATER SAPHENOUS VEIN;  Surgeon: Gaye Pollack, MD;  Location: [facility];  Service: Open Heart Surgery;  Laterality: Right;  ? LEFT HEART CATH AND CORONARY ANGIOGRAPHY N/A 07/27/2021  ? Procedure: LEFT HEART CATH AND CORONARY ANGIOGRAPHY;  Surgeon: Wellington Hampshire, MD;  Location: [facility];  Service: Cardiovascular;  Laterality: N/A;  ? TEE WITHOUT CARDIOVERSION N/A  08/01/2021  ? Procedure: TRANSESOPHAGEAL ECHOCARDIOGRAM (TEE);  Surgeon: Gaye Pollack, MD;  Location: [facility];  Service: Open Heart Surgery;  Laterality: N/A;  ? ? ? ?Current Outpatient Medications  ?Medication Sig Dispense Refill  ? aspirin EC 81 MG tablet Take 1 tablet (81 mg total) by mouth daily. Swallow whole. 90 tablet 3  ? clopidogrel (PLAVIX) 75 MG tablet Take 1 tablet (75 mg total) by mouth daily. 90 tablet 3  ? ezetimibe (ZETIA) 10 MG tablet Take 1 tablet (10 mg total) by mouth daily. 90 tablet 3  ? furosemide (LASIX) 40 MG tablet Take 1 tablet (40 mg total) by mouth daily. 90 tablet 3  ? melatonin 3 MG TABS tablet Take 1 tablet (3 mg total) by mouth at bedtime.  0  ? metoprolol tartrate (LOPRESSOR) 25 MG tablet Take 1 tablet (25 mg total) by mouth 2 (two) times daily. 60 tablet 1  ? rosuvastatin (CRESTOR) 20 MG tablet Take 1 tablet (20 mg total) by mouth daily. 90 tablet 3  ? rosuvastatin (CRESTOR) 20 MG tablet Take 1 tablet (20 mg total) by mouth daily. (Patient not taking: Reported on 10/12/2021) 30 tablet 1  ? ?No current facility-administered medications for this visit.  ? ? ?Allergies:   Lipitor [atorvastatin]  ? ? ?Social History:  The patient  reports that he has quit smoking. His smoking use included cigars. He has never used smokeless tobacco. He reports that he does not drink alcohol and does not use drugs.  ? ?Family History:  The patient's family history includes Stroke (age of onset: 89) in his father; Sudden death (age of onset: 43) in his mother.  ? ? ?ROS:  Please see the history of present illness.  ? ? ?Physical Exam: ?Blood pressure 120/62, pulse (!) 42, height '5\' 4"'$  (1.626 m), weight 145 lb 6.4 oz (66 kg), SpO2 97 %. ? ?GEN:  Well nourished, well developed in no acute distress ?HEENT: Normal ?NECK: No JVD; No carotid bruits ?LYMPHATICS: No lymphadenopathy ?CARDIAC: Regularly irregular, bigeminal pattern  , soft systolic murmur  ?RESPIRATORY:  Clear to auscultation without rales, wheezing  or rhonchi  ?ABDOMEN: Soft, non-tender, non-distended ?MUSCULOSKELETAL:  1 + leg edema No deformity  ?SKIN: Warm and dry ?NEUROLOGIC:  Alert and oriented x 3 ? ? ? ?EKG:      ? ? ?Recent Labs: ?07/27/2021: ALT 17; B Natriuretic Peptide 51.3; TSH 1.029 ?08/02/2021: Magnesium 2.7 ?  09/14/2021: Hemoglobin 9.9; Platelets 236 ?09/22/2021: BUN 35; Creatinine, Ser 1.31; Potassium 4.4; Sodium 141  ? ? ?Lipid Panel ?   ?Component Value Date/Time  ? CHOL 162 07/28/2021 0301  ? CHOL 219 (H) 01/26/2021 0849  ? TRIG 54 07/28/2021 0301  ? HDL 57 07/28/2021 0301  ? HDL 55 01/26/2021 0849  ? CHOLHDL 2.8 07/28/2021 0301  ? VLDL 11 07/28/2021 0301  ? LDLCALC 94 07/28/2021 0301  ? [place] 150 (H) 01/26/2021 0849  ? ?  ? ?Wt Readings from Last 3 Encounters:  ?10/12/21 145 lb 6.4 oz (66 kg)  ?09/07/21 144 lb (65.3 kg)  ?08/23/21 148 lb 9.6 oz (67.4 kg)  ?  ? ? ?Other studies Reviewed: ?Additional studies/ records that were reviewed today include: . ?Review of the above records demonstrates:  ? ? ?ASSESSMENT AND PLAN: ? ?1.  CAD - s/p CABG- doing well.  No  angina .  Exercising well  ? ?2.  Essential Hypertension:         BP is well controlled.  ?  ? ?3.   Hyperlipidemia:          LDL is still 94 ( on rosuvastatin 20  QD)  his goal is 50-70. ?Add Zetia 10 mg a day  ?Keep lab appt in mid May ?Consider PCSK9 inhibitor or inclisiran if he does not reach his goal of 50-70 with the addition of Zetia. ? ?Current medicines are reviewed at length with the patient today.  The patient does not have concerns regarding medicines. ? ?The following changes have been made:  no change ? ?Labs/ tests ordered today include:  ? ?No orders of the defined types were placed in this encounter. ? ?Disposition:   I will see him again in 1 year. ? ? ?Mertie Moores, MD  ?10/12/2021 1:56 PM    ?[facility] ?[address], [place], [place]  [address] ?Phone: [phone]; Fax: [phone]  ? ?

## 2021-10-12 ENCOUNTER — Ambulatory Visit: Payer: Medicare PPO | Admitting: Cardiovascular Disease

## 2021-10-12 ENCOUNTER — Encounter: Payer: Self-pay | Admitting: Cardiovascular Disease

## 2021-10-12 VITALS — BP 120/62 | HR 42 | Ht 64.0 in | Wt 145.4 lb

## 2021-10-12 DIAGNOSIS — I350 Nonrheumatic aortic (valve) stenosis: Secondary | ICD-10-CM | POA: Diagnosis not present

## 2021-10-12 DIAGNOSIS — I214 Non-ST elevation (NSTEMI) myocardial infarction: Secondary | ICD-10-CM | POA: Diagnosis not present

## 2021-10-12 MED ORDER — EZETIMIBE 10 MG PO TABS: 10.0000 mg | ORAL_TABLET | Freq: Every day | ORAL | 3 refills | Status: DC

## 2021-10-12 NOTE — Patient Instructions (Signed)
Medication Instructions:  ?Your physician has recommended you make the following change in your medication:  ? ?1) START Zetia '10mg'$  daily ? ?*If you need a refill on your cardiac medications before your next appointment, please call your pharmacy* ? ?Lab Work: ?NONE ?If you have labs (blood work) drawn today and your tests are completely normal, you will receive your results only by: ?MyChart Message (if you have MyChart) OR ?A paper copy in the mail ?If you have any lab test that is abnormal or we need to change your treatment, we will call you to review the results. ? ?Testing/Procedures: ?NONE ? ?Follow-Up: ?At [facility], you and your health needs are our priority.  As part of our continuing mission to provide you with exceptional heart care, we have created designated Provider Care Teams.  These Care Teams include your primary Cardiologist (physician) and Advanced Practice Providers (APPs -  Physician Assistants and Nurse Practitioners) who all work together to provide you with the care you need, when you need it. ? ?We recommend signing up for the patient portal called "MyChart".  Sign up information is provided on this After Visit Summary.  MyChart is used to connect with patients for Virtual Visits (Telemedicine).  Patients are able to view lab/test results, encounter notes, upcoming appointments, etc.  Non-urgent messages can be sent to your provider as well.   ?To learn more about what you can do with MyChart, go to [email or web address].   ? ?Your next appointment:   ?6 month(s) ? ?The format for your next appointment:   ?In Person ? ?Provider:   ?Robbie Lis, PA-C or [name], PA-C ?

## 2021-10-25 ENCOUNTER — Telehealth (HOSPITAL_COMMUNITY): Payer: Self-pay

## 2021-10-25 NOTE — Telephone Encounter (Signed)
No response from pt regarding CR.  Closed referral.  

## 2021-11-17 ENCOUNTER — Other Ambulatory Visit: Payer: Self-pay | Admitting: Cardiology

## 2021-11-21 ENCOUNTER — Other Ambulatory Visit: Payer: Self-pay | Admitting: Gastroenterology

## 2021-11-23 ENCOUNTER — Other Ambulatory Visit: Payer: Medicare PPO

## 2021-11-23 DIAGNOSIS — I350 Nonrheumatic aortic (valve) stenosis: Secondary | ICD-10-CM

## 2021-11-23 DIAGNOSIS — B351 Tinea unguium: Secondary | ICD-10-CM | POA: Diagnosis not present

## 2021-11-23 DIAGNOSIS — I10 Essential (primary) hypertension: Secondary | ICD-10-CM

## 2021-11-23 DIAGNOSIS — E782 Mixed hyperlipidemia: Secondary | ICD-10-CM

## 2021-11-23 DIAGNOSIS — I214 Non-ST elevation (NSTEMI) myocardial infarction: Secondary | ICD-10-CM | POA: Diagnosis not present

## 2021-11-23 DIAGNOSIS — N1831 Chronic kidney disease, stage 3a: Secondary | ICD-10-CM

## 2021-11-23 LAB — COMPREHENSIVE METABOLIC PANEL
ALT: 15 IU/L (ref 0–44)
AST: 23 IU/L (ref 0–40)
Albumin/Globulin Ratio: 1.6 (ref 1.2–2.2)
Albumin: 4.3 g/dL (ref 3.6–4.6)
Alkaline Phosphatase: 64 IU/L (ref 44–121)
BUN/Creatinine Ratio: 20 (ref 10–24)
BUN: 34 mg/dL — ABNORMAL HIGH (ref 8–27)
Bilirubin Total: 0.5 mg/dL (ref 0.0–1.2)
CO2: 26 mmol/L (ref 20–29)
Calcium: 9.3 mg/dL (ref 8.6–10.2)
Chloride: 100 mmol/L (ref 96–106)
Creatinine, Ser: 1.68 mg/dL — ABNORMAL HIGH (ref 0.76–1.27)
Globulin, Total: 2.7 g/dL (ref 1.5–4.5)
Glucose: 96 mg/dL (ref 70–99)
Potassium: 4.5 mmol/L (ref 3.5–5.2)
Sodium: 138 mmol/L (ref 134–144)
Total Protein: 7 g/dL (ref 6.0–8.5)
eGFR: 40 mL/min/{1.73_m2} — ABNORMAL LOW (ref >59)

## 2021-11-23 LAB — LIPID PANEL
Chol/HDL Ratio: 2.1 ratio (ref 0.0–5.0)
Cholesterol, Total: 130 mg/dL (ref 100–199)
HDL: 62 mg/dL (ref >39)
LDL Chol Calc (NIH): 55 mg/dL (ref 0–99)
Triglycerides: 58 mg/dL (ref 0–149)
VLDL Cholesterol Cal: 13 mg/dL (ref 5–40)

## 2021-11-24 ENCOUNTER — Telehealth: Payer: Self-pay | Admitting: *Deleted

## 2021-11-24 DIAGNOSIS — Z79899 Other long term (current) drug therapy: Secondary | ICD-10-CM

## 2021-11-24 MED ORDER — FUROSEMIDE 40 MG PO TABS: 20.0000 mg | ORAL_TABLET | Freq: Every day | ORAL | 3 refills | Status: DC

## 2021-11-24 NOTE — Telephone Encounter (Signed)
-----   Message from Liliane Shi, PA-C sent at 11/24/2021  8:38 AM EDT ----- Creatinine increased.  K+ normal.  LFTs normal.  LDL optimal. PLAN:  -Decrease Lasix to 20 mg daily -Otherwise continue current medications -BMET 2 weeks [name], PA-C    11/24/2021 8:32 AM

## 2021-12-06 ENCOUNTER — Ambulatory Visit: Payer: Medicare PPO | Admitting: Podiatry

## 2021-12-07 ENCOUNTER — Ambulatory Visit: Payer: Medicare PPO | Admitting: Podiatry

## 2021-12-07 ENCOUNTER — Encounter: Payer: Self-pay | Admitting: Podiatry

## 2021-12-07 DIAGNOSIS — B351 Tinea unguium: Secondary | ICD-10-CM

## 2021-12-07 DIAGNOSIS — I739 Peripheral vascular disease, unspecified: Secondary | ICD-10-CM | POA: Diagnosis not present

## 2021-12-07 DIAGNOSIS — L84 Corns and callosities: Secondary | ICD-10-CM | POA: Diagnosis not present

## 2021-12-07 DIAGNOSIS — M79674 Pain in right toe(s): Secondary | ICD-10-CM

## 2021-12-07 DIAGNOSIS — M79675 Pain in left toe(s): Secondary | ICD-10-CM | POA: Diagnosis not present

## 2021-12-07 NOTE — Progress Notes (Signed)
  Subjective:  Patient ID: Danny [name], male    DOB: July 26, 1933,   MRN: [id number]  No chief complaint on file.   88 y.o. male presents for concern of thickened elongated and painful nails that are difficult to trim. Requesting to have them trimmed today. Denies any burning or tingling in her feet. He has a history of heart surgery  and PAD.   Marland Kitchen Denies any other pedal complaints. Denies n/v/f/c.   Past Medical History:  Diagnosis Date   (HFpEF) heart failure with preserved ejection fraction ([place])    Echo 1/23: Inferior AK, mild aortic stenosis (mean 9 mmHg, V-max 210.5 cm/s, DI 0.70), EF 55-60, GR 1 DD, normal RVSF, normal PASP, trivial MR, mild AI, borderline dilation of aortic root (39 mm)   Acid reflux    hiatal hernia   CAD (coronary artery disease)    S/p non-STEMI 1/23 >> s/p CABG   Carotid artery disease ([place]) 08/23/2021   Pre-CABG Dopplers 1/23:R ICA 40-59; L ICA 1-39   Chronic kidney disease (CKD)    Diverticulitis    Erectile dysfunction 04/20/2015   Essential hypertension 04/20/2015   History of colon polyps    History of stroke    Noted on brain MRI 07/2021   Hyperlipidemia 05/28/2015   PAD (peripheral artery disease) ([place])    Pre-CABG Dopplers 1/23: Right ABI 0.65; left ABI 0.82    Objective:  Physical Exam: Vascular: DP/PT pulses 1/4 bilateral (edema). CFT <3 seconds. Absent hair growth. Bilateral edema and xerosis noted.  Skin. No lacerations or abrasions bilateral feet. Hyperkeratotic tissue and scabbing noted to dorsum of foot upon debridement no wound noted to be present. Area covered with bandaid at present. Nails 1-5 bilateral are thickened discolored with subungual debris.  Musculoskeletal: MMT 5/5 bilateral lower extremities in DF, PF, Inversion and Eversion. Deceased ROM in DF of ankle joint.  Neurological: Sensation intact to light touch.   Assessment:   1. PAD (peripheral artery disease) (HCC)   2. Pain due to onychomycosis of toenails of both feet    3. Pre-ulcerative calluses      Plan:  Patient was evaluated and treated and all questions answered. -Discussed and educated patient on foot care, especially with  regards to the vascular, neurological and musculoskeletal systems.  -Discussed supportive shoes at all times and checking feet regularly.  -Mechanically debrided all nails 1-5 bilateral using sterile nail nipper and filed with dremel without incident  -Hyperkeratotic lesion on dorsal left foot debrided without incident. Discussed follow-up with wound care if he notices any openeing but at this time wound healed.  -Answered all patient questions -Patient to return  in 3 months for at risk foot care -Patient advised to call the office if any problems or questions arise in the meantime.   Lorenda Peck, DPM

## 2021-12-09 ENCOUNTER — Other Ambulatory Visit: Payer: Medicare PPO | Admitting: *Deleted

## 2021-12-09 DIAGNOSIS — Z79899 Other long term (current) drug therapy: Secondary | ICD-10-CM | POA: Diagnosis not present

## 2021-12-09 LAB — BASIC METABOLIC PANEL
BUN/Creatinine Ratio: 18 (ref 10–24)
BUN: 29 mg/dL — ABNORMAL HIGH (ref 8–27)
CO2: 23 mmol/L (ref 20–29)
Calcium: 9.5 mg/dL (ref 8.6–10.2)
Chloride: 102 mmol/L (ref 96–106)
Creatinine, Ser: 1.6 mg/dL — ABNORMAL HIGH (ref 0.76–1.27)
Glucose: 83 mg/dL (ref 70–99)
Potassium: 4.7 mmol/L (ref 3.5–5.2)
Sodium: 140 mmol/L (ref 134–144)
eGFR: 42 mL/min/{1.73_m2} — ABNORMAL LOW (ref >59)

## 2022-01-19 ENCOUNTER — Ambulatory Visit: Payer: Medicare PPO | Admitting: Family Medicine

## 2022-01-19 ENCOUNTER — Encounter: Payer: Self-pay | Admitting: Family Medicine

## 2022-01-19 VITALS — BP 134/72 | HR 52 | Temp 97.5°F | Ht 64.0 in | Wt 146.0 lb

## 2022-01-19 DIAGNOSIS — R319 Hematuria, unspecified: Secondary | ICD-10-CM | POA: Diagnosis not present

## 2022-01-19 LAB — POCT URINALYSIS DIPSTICK
Bilirubin, UA: NEGATIVE
Blood, UA: POSITIVE
Glucose, UA: NEGATIVE
Ketones, UA: NEGATIVE
Leukocytes, UA: NEGATIVE
Nitrite, UA: NEGATIVE
Protein, UA: POSITIVE — AB
Spec Grav, UA: 1.02 (ref 1.010–1.025)
Urobilinogen, UA: 0.2 E.U./dL
pH, UA: 6 (ref 5.0–8.0)

## 2022-01-19 NOTE — Patient Instructions (Signed)
You have blood in your urine today.  This may be due to the aspirin and Plavix. Do not stop these medications.   If you see bleeding from anywhere else follow up right away.  If the blood in your urine gets much worse follow up.  If you have any urinary symptoms such as urinary frequency, urgency, pain, vomiting, fever, chills, abdominal or back pain, then follow up.   Otherwise, please follow up with Dr. Sharlet Salina in 6-8 weeks for a wellness visit and to recheck your urine.   Hematuria, Adult Hematuria is blood in the urine. Blood may be visible in the urine, or it may be identified with a test. This condition can be caused by infections of the bladder, urethra, kidney, or prostate. Other possible causes include: Kidney stones. Cancer of the urinary tract. Too much calcium in the urine. Conditions that are passed from parent to child (inherited conditions). Exercise that requires a lot of energy. Infections can usually be treated with medicine, and a kidney stone usually will pass through your urine. If neither of these is the cause of your hematuria, more tests may be needed to identify the cause of your symptoms. It is very important to tell your health care provider about any blood in your urine, even if it is painless or the blood stops without treatment. Blood in the urine, when it happens and then stops and then happens again, can be a symptom of a very serious condition, including cancer. There is no pain in the initial stages of many urinary cancers. Follow these instructions at home: Medicines Take over-the-counter and prescription medicines only as told by your health care provider. If you were prescribed an antibiotic medicine, take it as told by your health care provider. Do not stop taking the antibiotic even if you start to feel better. Eating and drinking Drink enough fluid to keep your urine pale yellow. It is recommended that you drink 3-4 quarts (2.8-3.8 L) a day. If you have  been diagnosed with an infection, drinking cranberry juice in addition to large amounts of water is recommended. Avoid caffeine, tea, and carbonated beverages. These tend to irritate the bladder. Avoid alcohol because it may irritate the prostate (in males). General instructions If you have been diagnosed with a kidney stone, follow your health care provider's instructions about straining your urine to catch the stone. Empty your bladder often. Avoid holding urine for long periods of time. If you are male: After a bowel movement, wipe from front to back and use each piece of toilet paper only once. Empty your bladder before and after sex. Pay attention to any changes in your symptoms. Tell your health care provider about any changes or any new symptoms. It is up to you to get the results of any tests. Ask your health care provider, or the department that is doing the test, when your results will be ready. Keep all follow-up visits. This is important. Contact a health care provider if: You develop back pain. You have a fever or chills. You have nausea or vomiting. Your symptoms do not improve after 3 days. Your symptoms get worse. Get help right away if: You develop severe vomiting and are unable to take medicine without vomiting. You develop severe pain in your back or abdomen even though you are taking medicine. You pass a large amount of blood in your urine. You pass blood clots in your urine. You feel very weak or like you might faint. You faint. Summary  Hematuria is blood in the urine. It has many possible causes. It is very important that you tell your health care provider about any blood in your urine, even if it is painless or the blood stops without treatment. Take over-the-counter and prescription medicines only as told by your health care provider. Drink enough fluid to keep your urine pale yellow. This information is not intended to replace advice given to you by your  health care provider. Make sure you discuss any questions you have with your health care provider. Document Revised: 02/25/2020 Document Reviewed: 02/25/2020 Elsevier Patient Education  [employer].

## 2022-01-19 NOTE — Progress Notes (Signed)
Subjective:  Danny [name] is a 88 y.o. male who complains of hematuria x 3 days.   Patient denies  fever, chills, fatigue, dizziness, chest pain, back pain, abdominal pain, N/V/D or constipation. No urinary frequency, hesitancy, urgency, dysuria .  Last UTI was never.   Using nothing for current symptoms.   Denies hx of renal calculus.   Taking aspirin and Plavix.  Denies nosebleeds, GI bleeding.   Smoked cigars in the past.   Patient does not have a history of recurrent UTI. Patient does not have a history of pyelonephritis.  No other aggravating or relieving factors.  No other c/o.  Past Medical History:  Diagnosis Date   (HFpEF) heart failure with preserved ejection fraction ([place])    Echo 1/23: Inferior AK, mild aortic stenosis (mean 9 mmHg, V-max 210.5 cm/s, DI 0.70), EF 55-60, GR 1 DD, normal RVSF, normal PASP, trivial MR, mild AI, borderline dilation of aortic root (39 mm)   Acid reflux    hiatal hernia   CAD (coronary artery disease)    S/p non-STEMI 1/23 >> s/p CABG   Carotid artery disease ([place]) 08/23/2021   Pre-CABG Dopplers 1/23:R ICA 40-59; L ICA 1-39   Chronic kidney disease (CKD)    Diverticulitis    Erectile dysfunction 04/20/2015   Essential hypertension 04/20/2015   History of colon polyps    History of stroke    Noted on brain MRI 07/2021   Hyperlipidemia 05/28/2015   PAD (peripheral artery disease) ([place])    Pre-CABG Dopplers 1/23: Right ABI 0.65; left ABI 0.82    ROS as in subjective  Reviewed allergies, medications, past medical, surgical, and social history.    Objective: Vitals:   01/19/22 0838  BP: 134/72  Pulse: (!) 52  Temp: (!) 97.5 F (36.4 C)  SpO2: 99%   Cardiac: RRR, murmur present Respiratory: CTA General appearance: alert, no distress, WD/WN, male Abdomen: +bs, soft, non tender, non distended, no masses Back: no CVA tenderness Extremities: no edema       Laboratory:  Urine dipstick: 3+ for hemoglobin, negative for leukocyte  esterase, negative for nitrites, and 2+ for protein.       Assessment:  Painless hematuria - Plan: POCT urinalysis dipstick   Plan: No acute distress. Exam is benign. Discussed that hematuria may be due to Plavix and low dose aspirin. Continuing those medications are currently more beneficial than the risk of stopping them. Avoid all NSAIDs.  Advised increased water intake, can use OTC Tylenol for pain.    Advised to closely monitor for increased bleeding or if he notices blood from any other area, he will need to be seen again.   Urine culture not sent.   Call or return if worse or not improving.  Follow up with Dr. Sharlet Salina for Wellness visit and follow up on hematuria in 2 months.

## 2022-01-23 DIAGNOSIS — Z85828 Personal history of other malignant neoplasm of skin: Secondary | ICD-10-CM | POA: Diagnosis not present

## 2022-01-23 DIAGNOSIS — L814 Other melanin hyperpigmentation: Secondary | ICD-10-CM | POA: Diagnosis not present

## 2022-01-23 DIAGNOSIS — L821 Other seborrheic keratosis: Secondary | ICD-10-CM | POA: Diagnosis not present

## 2022-01-23 DIAGNOSIS — L570 Actinic keratosis: Secondary | ICD-10-CM | POA: Diagnosis not present

## 2022-03-15 ENCOUNTER — Ambulatory Visit: Payer: Medicare PPO | Admitting: Podiatry

## 2022-04-03 ENCOUNTER — Ambulatory Visit: Payer: Medicare PPO | Admitting: Podiatry

## 2022-04-03 ENCOUNTER — Encounter: Payer: Self-pay | Admitting: Podiatry

## 2022-04-03 DIAGNOSIS — M79675 Pain in left toe(s): Secondary | ICD-10-CM | POA: Diagnosis not present

## 2022-04-03 DIAGNOSIS — M79674 Pain in right toe(s): Secondary | ICD-10-CM

## 2022-04-03 DIAGNOSIS — L84 Corns and callosities: Secondary | ICD-10-CM

## 2022-04-03 DIAGNOSIS — I739 Peripheral vascular disease, unspecified: Secondary | ICD-10-CM

## 2022-04-03 DIAGNOSIS — B351 Tinea unguium: Secondary | ICD-10-CM | POA: Diagnosis not present

## 2022-04-03 NOTE — Progress Notes (Signed)
  Subjective:  Patient ID: Danny [name], male    DOB: 1933-11-30,   MRN: [id number]  Chief Complaint  Patient presents with   Nail Problem     Routine foot care    88 y.o. male presents for concern of thickened elongated and painful nails that are difficult to trim. Requesting to have them trimmed today. Denies any burning or tingling in her feet. He has a history of heart surgery  and PAD.   Marland Kitchen Denies any other pedal complaints. Denies n/v/f/c.   Past Medical History:  Diagnosis Date   (HFpEF) heart failure with preserved ejection fraction ([place])    Echo 1/23: Inferior AK, mild aortic stenosis (mean 9 mmHg, V-max 210.5 cm/s, DI 0.70), EF 55-60, GR 1 DD, normal RVSF, normal PASP, trivial MR, mild AI, borderline dilation of aortic root (39 mm)   Acid reflux    hiatal hernia   CAD (coronary artery disease)    S/p non-STEMI 1/23 >> s/p CABG   Carotid artery disease ([place]) 08/23/2021   Pre-CABG Dopplers 1/23:R ICA 40-59; L ICA 1-39   Chronic kidney disease (CKD)    Diverticulitis    Erectile dysfunction 04/20/2015   Essential hypertension 04/20/2015   History of colon polyps    History of stroke    Noted on brain MRI 07/2021   Hyperlipidemia 05/28/2015   PAD (peripheral artery disease) ([place])    Pre-CABG Dopplers 1/23: Right ABI 0.65; left ABI 0.82    Objective:  Physical Exam: Vascular: DP/PT pulses 1/4 bilateral (edema). CFT <3 seconds. Absent hair growth. Bilateral edema and xerosis noted.  Skin. No lacerations or abrasions bilateral feet. Hyperkeratotic tissue and scabbing noted to dorsum of foot upon debridement no wound noted to be present. Area covered with bandaid at present. Nails 1-5 bilateral are thickened discolored with subungual debris.  Musculoskeletal: MMT 5/5 bilateral lower extremities in DF, PF, Inversion and Eversion. Deceased ROM in DF of ankle joint.  Neurological: Sensation intact to light touch.   Assessment:   1. Pain due to onychomycosis of toenails of  both feet   2. Pre-ulcerative calluses   3. PAD (peripheral artery disease) ([place])      Plan:  Patient was evaluated and treated and all questions answered. -Discussed and educated patient on foot care, especially with  regards to the vascular, neurological and musculoskeletal systems.  -Discussed supportive shoes at all times and checking feet regularly.  -Mechanically debrided all nails 1-5 bilateral using sterile nail nipper and filed with dremel without incident  -Hyperkeratotic lesion on dorsal left foot debrided without incident. Discussed follow-up with wound care if he notices any openeing but at this time wound healed.  -Answered all patient questions -Patient to return  in 3 months for at risk foot care -Patient advised to call the office if any problems or questions arise in the meantime.   Lorenda Peck, DPM

## 2022-04-10 ENCOUNTER — Ambulatory Visit (INDEPENDENT_AMBULATORY_CARE_PROVIDER_SITE_OTHER): Payer: Medicare PPO

## 2022-04-10 VITALS — Ht 64.0 in | Wt 142.0 lb

## 2022-04-10 DIAGNOSIS — Z0000 Encounter for general adult medical examination without abnormal findings: Secondary | ICD-10-CM | POA: Diagnosis not present

## 2022-04-10 NOTE — Patient Instructions (Signed)
[name] , Thank you for taking time to come for your Medicare Wellness Visit. I appreciate your ongoing commitment to your health goals. Please review the following plan we discussed and let me know if I can assist you in the future.   These are the goals we discussed:  Goals       DIET - INCREASE WATER INTAKE (pt-stated)      Maintain my health.        This is a list of the screening recommended for you and due dates:  Health Maintenance  Topic Date Due   Zoster (Shingles) Vaccine (1 of 2) Never done   Pneumonia Vaccine (1 - PCV) Never done   COVID-19 Vaccine (3 - Pfizer risk series) 11/11/2019   Flu Shot  02/07/2022   Tetanus Vaccine  03/10/2028   HPV Vaccine  Aged Out    Advanced directives: PATIENT HAS A LIVING WILL.  Conditions/risks identified: YES  Next appointment: Follow up in one year for your annual wellness visit.   Preventive Care 73 Years and Older, Male  Preventive care refers to lifestyle choices and visits with your health care provider that can promote health and wellness. What does preventive care include? A yearly physical exam. This is also called an annual well check. Dental exams once or twice a year. Routine eye exams. Ask your health care provider how often you should have your eyes checked. Personal lifestyle choices, including: Daily care of your teeth and gums. Regular physical activity. Eating a healthy diet. Avoiding tobacco and drug use. Limiting alcohol use. Practicing safe sex. Taking low doses of aspirin every day. Taking vitamin and mineral supplements as recommended by your health care provider. What happens during an annual well check? The services and screenings done by your health care provider during your annual well check will depend on your age, overall health, lifestyle risk factors, and family history of disease. Counseling  Your health care provider may ask you questions about your: Alcohol use. Tobacco use. Drug  use. Emotional well-being. Home and relationship well-being. Sexual activity. Eating habits. History of falls. Memory and ability to understand (cognition). Work and work Statistician. Screening  You may have the following tests or measurements: Height, weight, and BMI. Blood pressure. Lipid and cholesterol levels. These may be checked every 5 years, or more frequently if you are over 8 years old. Skin check. Lung cancer screening. You may have this screening every year starting at age 86 if you have a 30-pack-year history of smoking and currently smoke or have quit within the past 15 years. Fecal occult blood test (FOBT) of the stool. You may have this test every year starting at age 45. Flexible sigmoidoscopy or colonoscopy. You may have a sigmoidoscopy every 5 years or a colonoscopy every 10 years starting at age 86. Prostate cancer screening. Recommendations will vary depending on your family history and other risks. Hepatitis C blood test. Hepatitis B blood test. Sexually transmitted disease (STD) testing. Diabetes screening. This is done by checking your blood sugar (glucose) after you have not eaten for a while (fasting). You may have this done every 1-3 years. Abdominal aortic aneurysm (AAA) screening. You may need this if you are a current or former smoker. Osteoporosis. You may be screened starting at age 86 if you are at high risk. Talk with your health care provider about your test results, treatment options, and if necessary, the need for more tests. Vaccines  Your health care provider may recommend certain  vaccines, such as: Influenza vaccine. This is recommended every year. Tetanus, diphtheria, and acellular pertussis (Tdap, Td) vaccine. You may need a Td booster every 10 years. Zoster vaccine. You may need this after age 85. Pneumococcal 13-valent conjugate (PCV13) vaccine. One dose is recommended after age 69. Pneumococcal polysaccharide (PPSV23) vaccine. One dose is  recommended after age 89. Talk to your health care provider about which screenings and vaccines you need and how often you need them. This information is not intended to replace advice given to you by your health care provider. Make sure you discuss any questions you have with your health care provider. Document Released: 07/23/2015 Document Revised: 03/15/2016 Document Reviewed: 04/27/2015 Elsevier Interactive Patient Education  2017 [place] Prevention in the Home Falls can cause injuries. They can happen to people of all ages. There are many things you can do to make your home safe and to help prevent falls. What can I do on the outside of my home? Regularly fix the edges of walkways and driveways and fix any cracks. Remove anything that might make you trip as you walk through a door, such as a raised step or threshold. Trim any bushes or trees on the path to your home. Use bright outdoor lighting. Clear any walking paths of anything that might make someone trip, such as rocks or tools. Regularly check to see if handrails are loose or broken. Make sure that both sides of any steps have handrails. Any raised decks and porches should have guardrails on the edges. Have any leaves, snow, or ice cleared regularly. Use sand or salt on walking paths during winter. Clean up any spills in your garage right away. This includes oil or grease spills. What can I do in the bathroom? Use night lights. Install grab bars by the toilet and in the tub and shower. Do not use towel bars as grab bars. Use non-skid mats or decals in the tub or shower. If you need to sit down in the shower, use a plastic, non-slip stool. Keep the floor dry. Clean up any water that spills on the floor as soon as it happens. Remove soap buildup in the tub or shower regularly. Attach bath mats securely with double-sided non-slip rug tape. Do not have throw rugs and other things on the floor that can make you  trip. What can I do in the bedroom? Use night lights. Make sure that you have a light by your bed that is easy to reach. Do not use any sheets or blankets that are too big for your bed. They should not hang down onto the floor. Have a firm chair that has side arms. You can use this for support while you get dressed. Do not have throw rugs and other things on the floor that can make you trip. What can I do in the kitchen? Clean up any spills right away. Avoid walking on wet floors. Keep items that you use a lot in easy-to-reach places. If you need to reach something above you, use a strong step stool that has a grab bar. Keep electrical cords out of the way. Do not use floor polish or wax that makes floors slippery. If you must use wax, use non-skid floor wax. Do not have throw rugs and other things on the floor that can make you trip. What can I do with my stairs? Do not leave any items on the stairs. Make sure that there are handrails on both sides of the stairs  and use them. Fix handrails that are broken or loose. Make sure that handrails are as long as the stairways. Check any carpeting to make sure that it is firmly attached to the stairs. Fix any carpet that is loose or worn. Avoid having throw rugs at the top or bottom of the stairs. If you do have throw rugs, attach them to the floor with carpet tape. Make sure that you have a light switch at the top of the stairs and the bottom of the stairs. If you do not have them, ask someone to add them for you. What else can I do to help prevent falls? Wear shoes that: Do not have high heels. Have rubber bottoms. Are comfortable and fit you well. Are closed at the toe. Do not wear sandals. If you use a stepladder: Make sure that it is fully opened. Do not climb a closed stepladder. Make sure that both sides of the stepladder are locked into place. Ask someone to hold it for you, if possible. Clearly mark and make sure that you can  see: Any grab bars or handrails. First and last steps. Where the edge of each step is. Use tools that help you move around (mobility aids) if they are needed. These include: Canes. Walkers. Scooters. Crutches. Turn on the lights when you go into a dark area. Replace any light bulbs as soon as they burn out. Set up your furniture so you have a clear path. Avoid moving your furniture around. If any of your floors are uneven, fix them. If there are any pets around you, be aware of where they are. Review your medicines with your doctor. Some medicines can make you feel dizzy. This can increase your chance of falling. Ask your doctor what other things that you can do to help prevent falls. This information is not intended to replace advice given to you by your health care provider. Make sure you discuss any questions you have with your health care provider. Document Released: 04/22/2009 Document Revised: 12/02/2015 Document Reviewed: 07/31/2014 Elsevier Interactive Patient Education  2017 [employer].

## 2022-04-10 NOTE — Progress Notes (Signed)
Virtual Visit via Telephone Note  I connected with  Danny [name] on 04/10/22 at  3:00 PM EDT by telephone and verified that I am speaking with the correct person using two identifiers.  Location: Patient: HOME Provider: [facility] Persons participating in the virtual visit: patient/Nurse Health Advisor   I discussed the limitations, risks, security and privacy concerns of performing an evaluation and management service by telephone and the availability of in person appointments. The patient expressed understanding and agreed to proceed.  Interactive audio and video telecommunications were attempted between this nurse and patient, however failed, due to patient having technical difficulties OR patient did not have access to video capability.  We continued and completed visit with audio only.  Some vital signs may be absent or patient reported.   Sheral Flow, LPN  Subjective:   Danny [name] is a 88 y.o. male who presents for Medicare Annual/Subsequent preventive examination.  Review of Systems     Cardiac Risk Factors include: advanced age (>44mn, >>79women);dyslipidemia;family history of premature cardiovascular disease;hypertension;male gender;sedentary lifestyle     Objective:    Today's Vitals   04/10/22 1518  Weight: 142 lb (64.4 kg)  Height: '5\' 4"'$  (1.626 m)  PainSc: 0-No pain   Body mass index is 24.37 kg/m.     04/10/2022    3:10 PM 07/27/2021   10:17 AM 07/27/2021   12:24 AM  Advanced Directives  Does Patient Have a Medical Advance Directive? Yes No Unable to assess, patient is non-responsive or altered mental status  Type of Advance Directive Living will    Would patient like information on creating a medical advance directive?  No - Patient declined     Current Medications (verified) Outpatient Encounter Medications as of 04/10/2022  Medication Sig   aspirin EC 81 MG tablet Take 1 tablet (81 mg total) by mouth daily. Swallow whole.    clopidogrel (PLAVIX) 75 MG tablet Take 1 tablet (75 mg total) by mouth daily.   ezetimibe (ZETIA) 10 MG tablet Take 1 tablet (10 mg total) by mouth daily.   furosemide (LASIX) 40 MG tablet Take 0.5 tablets (20 mg total) by mouth daily.   melatonin 3 MG TABS tablet Take 1 tablet (3 mg total) by mouth at bedtime.   metoprolol tartrate (LOPRESSOR) 25 MG tablet Take 1 tablet by mouth twice daily   rosuvastatin (CRESTOR) 20 MG tablet Take 1 tablet (20 mg total) by mouth daily.   rosuvastatin (CRESTOR) 20 MG tablet Take 1 tablet (20 mg total) by mouth daily.   No facility-administered encounter medications on file as of 04/10/2022.    Allergies (verified) Lipitor [atorvastatin]   History: Past Medical History:  Diagnosis Date   (HFpEF) heart failure with preserved ejection fraction ([facility]    Echo 1/23: Inferior AK, mild aortic stenosis (mean 9 mmHg, V-max 210.5 cm/s, DI 0.70), EF 55-60, GR 1 DD, normal RVSF, normal PASP, trivial MR, mild AI, borderline dilation of aortic root (39 mm)   Acid reflux    hiatal hernia   CAD (coronary artery disease)    S/p non-STEMI 1/23 >> s/p CABG   Carotid artery disease ([facility] 08/23/2021   Pre-CABG Dopplers 1/23:R ICA 40-59; L ICA 1-39   Chronic kidney disease (CKD)    Diverticulitis    Erectile dysfunction 04/20/2015   Essential hypertension 04/20/2015   History of colon polyps    History of stroke    Noted on brain MRI 07/2021   Hyperlipidemia 05/28/2015  PAD (peripheral artery disease) (HCC)    Pre-CABG Dopplers 1/23: Right ABI 0.65; left ABI 0.82   Past Surgical History:  Procedure Laterality Date   APPENDECTOMY     cataract surgery     CORONARY ARTERY BYPASS GRAFT N/A 08/01/2021   Procedure: CORONARY ARTERY BYPASS GRAFTING (CABG) X 3  ,ON PUMP, USING LEFT AND RIGHT INTERNAL MAMMARY ARTERIES, RIGHT AND LEFT ENDOSCOPIC GREATER SAPHENOUS VEIN CONDUITS;  Surgeon: Gaye Pollack, MD;  Location: [place];  Service: Open Heart Surgery;  Laterality: N/A;    ENDOVEIN HARVEST OF GREATER SAPHENOUS VEIN Right 08/01/2021   Procedure: ENDOVEIN HARVEST OF GREATER SAPHENOUS VEIN;  Surgeon: Gaye Pollack, MD;  Location: [place];  Service: Open Heart Surgery;  Laterality: Right;   LEFT HEART CATH AND CORONARY ANGIOGRAPHY N/A 07/27/2021   Procedure: LEFT HEART CATH AND CORONARY ANGIOGRAPHY;  Surgeon: Wellington Hampshire, MD;  Location: [place] [facility];  Service: Cardiovascular;  Laterality: N/A;   TEE WITHOUT CARDIOVERSION N/A 08/01/2021   Procedure: TRANSESOPHAGEAL ECHOCARDIOGRAM (TEE);  Surgeon: Gaye Pollack, MD;  Location: [place];  Service: Open Heart Surgery;  Laterality: N/A;   Family History  Problem Relation Age of Onset   Stroke Father 19   Sudden death Mother 55       unknown cause   Social History   Socioeconomic History   Marital status: Married    Spouse name: Not on file   Number of children: 1   Years of education: 12   Highest education level: Not on file  Occupational History   Occupation: Retired  Tobacco Use   Smoking status: Former    Types: Cigars   Smokeless tobacco: Never  Scientific laboratory technician Use: Never used  Substance and Sexual Activity   Alcohol use: No   Drug use: No   Sexual activity: Not on file  Other Topics Concern   Not on file  Social History Narrative   Lives with wife    Caffeine use: 1-2 cups coffee in the am and 1 glass tea qpm   Social Determinants of Health   Financial Resource Strain: Low Risk  (04/10/2022)   Overall Financial Resource Strain (CARDIA)    Difficulty of Paying Living Expenses: Not hard at all  Food Insecurity: No Food Insecurity (04/10/2022)   Hunger Vital Sign    Worried About Running Out of Food in the Last Year: Never true    [place] in the Last Year: Never true  Transportation Needs: No Transportation Needs (04/10/2022)   PRAPARE - Hydrologist (Medical): No    Lack of Transportation (Non-Medical): No  Physical Activity: Inactive  (04/10/2022)   Exercise Vital Sign    Days of Exercise per Week: 0 days    Minutes of Exercise per Session: 0 min  Stress: No Stress Concern Present (04/10/2022)   [place]    Feeling of Stress : Not at all  Social Connections: [place] (04/10/2022)   Social Connection and Isolation Panel [NHANES]    Frequency of Communication with Friends and Family: More than three times a week    Frequency of Social Gatherings with Friends and Family: More than three times a week    Attends Religious Services: More than 4 times per year    Active Member of [employer] or Organizations: Yes    Attends Archivist Meetings: More than 4 times per  year    Marital Status: Married    Tobacco Counseling Counseling given: Not Answered   Clinical Intake:  Pre-visit preparation completed: Yes  Pain : No/denies pain Pain Score: 0-No pain     BMI - recorded: 24.37 Nutritional Status: BMI of 19-24  Normal Nutritional Risks: None Diabetes: No  How often do you need to have someone help you when you read instructions, pamphlets, or other written materials from your doctor or pharmacy?: 1 - Never What is the last grade level you completed in school?: HSG  Diabetic? NO  Interpreter Needed?: No  Information entered by :: Lisette Abu, LPN.   Activities of Daily Living    04/10/2022    3:21 PM 07/31/2021   11:00 PM  In your present state of health, do you have any difficulty performing the following activities:  Hearing? 0 0  Vision? 0 0  Difficulty concentrating or making decisions? 0 0  Walking or climbing stairs? 0 0  Dressing or bathing? 0 0  Doing errands, shopping? 0 0  Preparing Food and eating ? N   Using the Toilet? N   In the past six months, have you accidently leaked urine? N   Do you have problems with loss of bowel control? N   Managing your Medications? N   Managing your Finances? N    Housekeeping or managing your Housekeeping? N     Patient Care Team: Hoyt Koch, MD as PCP - General (Internal Medicine) Nahser, Wonda Cheng, MD as PCP - Cardiology (Cardiology)  Indicate any recent Medical Services you may have received from other than [facility] providers in the past year (date may be approximate).     Assessment:   This is a routine wellness examination for [place].  Hearing/Vision screen Hearing Screening - Comments:: Denies hearing difficulties   Vision Screening - Comments:: Wears rx glasses - up to date with routine eye exams with Rutherford Guys, MD.   Dietary issues and exercise activities discussed: Current Exercise Habits: The patient does not participate in regular exercise at present, Exercise limited by: cardiac condition(s)   Goals Addressed               This Visit's Progress     DIET - INCREASE WATER INTAKE (pt-stated)        Maintain my health.      Depression Screen    04/10/2022    3:12 PM 11/20/2019    2:56 PM 07/23/2018   11:35 AM 04/20/2015    2:51 PM  PHQ 2/9 Scores  PHQ - 2 Score 0 0 0 0  PHQ- 9 Score   0     Fall Risk    04/10/2022    3:11 PM 11/20/2019    2:56 PM 07/23/2018    9:32 AM 01/26/2017    1:22 PM 04/20/2015    2:51 PM  [place] in the past year? 0 0 0 No No  Comment    Emmi Telephone Survey: data to providers prior to load   Number falls in past yr: 0 0     Injury with Fall? 0 0     Risk for fall due to : No Fall Risks      Follow up Falls prevention discussed        [place]:  Any stairs in or around the home? No  If so, are there any without handrails? No  Home free of loose throw  rugs in walkways, pet beds, electrical cords, etc? Yes  Adequate lighting in your home to reduce risk of falls? Yes   ASSISTIVE DEVICES UTILIZED TO PREVENT FALLS:  Life alert? No  Use of a cane, walker or w/c? No  Grab bars in the bathroom? Yes  Shower chair or bench in  shower? No  Elevated toilet seat or a handicapped toilet? No   TIMED UP AND GO:  Was the test performed? No . PHONE VISIT  Cognitive Function:        04/10/2022    3:22 PM  6CIT Screen  What Year? 0 points  What month? 0 points  What time? 0 points  Count back from 20 0 points  Months in reverse 0 points  Repeat phrase 0 points  Total Score 0 points    Immunizations Immunization History  Administered Date(s) Administered   Influenza, High Dose Seasonal PF 03/29/2018   Influenza,inj,Quad PF,6+ Mos 03/28/2017   PFIZER(Purple Top)SARS-COV-2 Vaccination 09/23/2019, 10/14/2019    TDAP status: Up to date  Flu Vaccine status: Due, Education has been provided regarding the importance of this vaccine. Advised may receive this vaccine at local pharmacy or Health Dept. Aware to provide a copy of the vaccination record if obtained from local pharmacy or Health Dept. Verbalized acceptance and understanding.  Pneumococcal vaccine status: Due, Education has been provided regarding the importance of this vaccine. Advised may receive this vaccine at local pharmacy or Health Dept. Aware to provide a copy of the vaccination record if obtained from local pharmacy or Health Dept. Verbalized acceptance and understanding.  Covid-19 vaccine status: Completed vaccines  Qualifies for Shingles Vaccine? Yes   Zostavax completed No   Shingrix Completed?: No.    Education has been provided regarding the importance of this vaccine. Patient has been advised to call insurance company to determine out of pocket expense if they have not yet received this vaccine. Advised may also receive vaccine at local pharmacy or Health Dept. Verbalized acceptance and understanding.  Screening Tests Health Maintenance  Topic Date Due   Zoster Vaccines- Shingrix (1 of 2) Never done   Pneumonia Vaccine 30+ Years old (1 - PCV) Never done   COVID-19 Vaccine (3 - Pfizer risk series) 11/11/2019   INFLUENZA VACCINE   02/07/2022   TETANUS/TDAP  03/10/2028   HPV VACCINES  Aged Out    Health Maintenance  Health Maintenance Due  Topic Date Due   Zoster Vaccines- Shingrix (1 of 2) Never done   Pneumonia Vaccine 63+ Years old (1 - PCV) Never done   COVID-19 Vaccine (3 - Pfizer risk series) 11/11/2019   INFLUENZA VACCINE  02/07/2022    Colorectal cancer screening: Type of screening: FOBT/FIT. Completed 07/27/2021. Repeat every 1 years  Lung Cancer Screening: (Low Dose CT Chest recommended if Age 58-80 years, 30 pack-year currently smoking OR have quit w/in 15years.) does not qualify.   Lung Cancer Screening Referral: NO  Additional Screening:  Hepatitis C Screening: does not qualify; Completed NO  Vision Screening: Recommended annual ophthalmology exams for early detection of glaucoma and other disorders of the eye. Is the patient up to date with their annual eye exam?  Yes  Who is the provider or what is the name of the office in which the patient attends annual eye exams? Rutherford Guys, MD. If pt is not established with a provider, would they like to be referred to a provider to establish care? No .   Dental Screening: Recommended annual dental exams  for proper oral hygiene  Community Resource Referral / Chronic Care Management: CRR required this visit?  No   CCM required this visit?  No      Plan:     I have personally reviewed and noted the following in the patient's chart:   Medical and social history Use of alcohol, tobacco or illicit drugs  Current medications and supplements including opioid prescriptions. Patient is not currently taking opioid prescriptions. Functional ability and status Nutritional status Physical activity Advanced directives List of other physicians Hospitalizations, surgeries, and ER visits in previous 12 months Vitals Screenings to include cognitive, depression, and falls Referrals and appointments  In addition, I have reviewed and discussed with  patient certain preventive protocols, quality metrics, and best practice recommendations. A written personalized care plan for preventive services as well as general preventive health recommendations were provided to patient.     Sheral Flow, LPN   22/12/3333   Nurse Notes: N/A

## 2022-05-03 DIAGNOSIS — H524 Presbyopia: Secondary | ICD-10-CM | POA: Diagnosis not present

## 2022-05-03 DIAGNOSIS — Z961 Presence of intraocular lens: Secondary | ICD-10-CM | POA: Diagnosis not present

## 2022-05-16 ENCOUNTER — Encounter: Payer: Self-pay | Admitting: Physician Assistant

## 2022-05-16 ENCOUNTER — Ambulatory Visit: Payer: Medicare PPO | Attending: Physician Assistant | Admitting: Physician Assistant

## 2022-05-16 VITALS — BP 100/50 | HR 68 | Ht 64.0 in | Wt 129.2 lb

## 2022-05-16 DIAGNOSIS — R634 Abnormal weight loss: Secondary | ICD-10-CM | POA: Diagnosis not present

## 2022-05-16 DIAGNOSIS — R319 Hematuria, unspecified: Secondary | ICD-10-CM

## 2022-05-16 DIAGNOSIS — I2510 Atherosclerotic heart disease of native coronary artery without angina pectoris: Secondary | ICD-10-CM | POA: Diagnosis not present

## 2022-05-16 DIAGNOSIS — I350 Nonrheumatic aortic (valve) stenosis: Secondary | ICD-10-CM | POA: Diagnosis not present

## 2022-05-16 DIAGNOSIS — I6523 Occlusion and stenosis of bilateral carotid arteries: Secondary | ICD-10-CM | POA: Diagnosis not present

## 2022-05-16 DIAGNOSIS — E785 Hyperlipidemia, unspecified: Secondary | ICD-10-CM

## 2022-05-16 DIAGNOSIS — I10 Essential (primary) hypertension: Secondary | ICD-10-CM | POA: Diagnosis not present

## 2022-05-16 DIAGNOSIS — N1831 Chronic kidney disease, stage 3a: Secondary | ICD-10-CM | POA: Diagnosis not present

## 2022-05-16 DIAGNOSIS — I5032 Chronic diastolic (congestive) heart failure: Secondary | ICD-10-CM

## 2022-05-16 HISTORY — DX: Atherosclerotic heart disease of native coronary artery without angina pectoris: I25.10

## 2022-05-16 MED ORDER — NITROGLYCERIN 0.4 MG SL SUBL: 0.4000 mg | SUBLINGUAL_TABLET | SUBLINGUAL | 3 refills | Status: AC | PRN

## 2022-05-16 MED ORDER — FUROSEMIDE 20 MG PO TABS: 20.0000 mg | ORAL_TABLET | Freq: Every day | ORAL | 1 refills | Status: DC | PRN

## 2022-05-16 NOTE — Assessment & Plan Note (Signed)
Carotid US in January 2023 with 40-59% right ICA stenosis.  Continue aspirin, statin therapy.  Arrange follow-up carotid US in January 2024.

## 2022-05-16 NOTE — Assessment & Plan Note (Signed)
He has lost ~ 50 lbs since Jan 2023. He notes nausea with certain meals as well. He plans to see GI. I have encouraged him to f/u with primary care and GI.

## 2022-05-16 NOTE — Assessment & Plan Note (Signed)
Mild aortic stenosis by echocardiogram January 2023 (mean gradient 9 mmHg).  Plan repeat echocardiogram in 2 to 3 years.

## 2022-05-16 NOTE — Assessment & Plan Note (Signed)
He notes a hx of this from time to time. I have asked him to follow up with urology for evaluation.

## 2022-05-16 NOTE — Assessment & Plan Note (Signed)
NYHA II.  Volume status stable.  He has lost a significant mount of weight since prior to his heart attack.  I think we can change his furosemide to as needed. Change furosemide to 20 mg daily as needed for leg edema or weight gain greater than 3 pounds in 1 day.

## 2022-05-16 NOTE — Assessment & Plan Note (Signed)
Blood pressure somewhat low.  He is asymptomatic.  Change Lasix to as needed as noted.  Continue metoprolol tartrate 25 mg twice daily.

## 2022-05-16 NOTE — Progress Notes (Signed)
Cardiology Office Note:    Date:  05/16/2022   ID:  PROMISE BUSHONG, DOB 08-Jun-1934, MRN [id number]  PCP:  Hoyt Koch, MD  [place] Providers Cardiologist:  Mertie Moores, MD     Referring MD: Hoyt Koch, *   Chief Complaint:  Follow-up for CAD    Patient Profile: Coronary artery disease Inf NSTEMI s/p CABG in 07/2021 RIMA-LAD, LIMA-OM1, S-OM2 Ischemic CM EF 40-45 by LV gram 1/23 Echocardiogram 1/23: EF 55-60, inf AK, mild AS, Gr 1 DD, NL RVSF, NL PASP (HFpEF) heart failure with preserved ejection fraction  Aortic stenosis  Mild - Echo 1/23: Mean gradient 9 mmHg, Vmax 210.5 cm/s, DI 0.70 Carotid artery disease [place] 4/09: RICA 81-19; LICA 1-47 Peripheral arterial disease ABIs 1/23: R 0.65; L 0.82 Hypertension  Hyperlipidemia  Myalgias with Atorvastatin  Chronic kidney disease stage III 12/2021: GFR 42 History of CVA (MRI 1/23: Chronic lacunar infarct left thalamus) Hx of syncope GERD ED Borderline dilation of aortic root (39 mm) - Echocardiogram 07/2021   Cardiac Studies & Procedures   CARDIAC CATHETERIZATION  CARDIAC CATHETERIZATION 07/27/2021  Narrative   Prox RCA lesion is 100% stenosed.   1st Mrg lesion is 90% stenosed.   Mid Cx to Dist Cx lesion is 90% stenosed.   1st Diag lesion is 95% stenosed.   2nd Diag-1 lesion is 80% stenosed.   2nd Diag-2 lesion is 85% stenosed.   Mid LAD lesion is 85% stenosed.   There is mild left ventricular systolic dysfunction.   LV end diastolic pressure is normal.  1.  Moderately calcified coronary arteries with severe three-vessel coronary artery disease.  The culprit for myocardial infarction is likely occluded proximal right coronary artery.  The onset of occlusion occlusion is likely more than 12 hours with no chest pain. 2.  Mildly reduced LV systolic function with an EF of 40 to 45% with normal left ventricular end-diastolic pressure.  There is evidence of basal to mid inferior wall  hypokinesis.  Recommendations: Given diffuse three-vessel coronary artery disease, I think the best option for revascularization is CABG. Resume heparin in 4 hours at 7 PM.  Multivessel PCI to the LAD, OM1 and left circumflex is also possible if the patient is deemed to be too high risk for CABG.  Findings Coronary Findings Diagnostic  Dominance: Right  Left Main The vessel exhibits minimal luminal irregularities.  Left Anterior Descending Mid LAD lesion is 85% stenosed.  First Diagonal Branch Vessel is large in size. 1st Diag lesion is 95% stenosed.  Second Diagonal Branch Vessel is large in size. 2nd Diag-1 lesion is 80% stenosed. 2nd Diag-2 lesion is 85% stenosed.  Left Circumflex Mid Cx to Dist Cx lesion is 90% stenosed.  First Obtuse Marginal Branch 1st Mrg lesion is 90% stenosed.  Second Obtuse Marginal Branch Vessel is small in size.  Right Coronary Artery Prox RCA lesion is 100% stenosed.  Right Posterior Descending Artery Collaterals RPDA filled by collaterals from Dist LAD.  Third Right Posterolateral Branch Collaterals 3rd RPL filled by collaterals from Dist Cx.  Intervention  No interventions have been documented.   STRESS TESTS  NM MYOCAR MULTI W/SPECT W 12/08/2011   ECHOCARDIOGRAM  ECHOCARDIOGRAM COMPLETE 07/28/2021  Narrative ECHOCARDIOGRAM REPORT    Patient Name:   JAISHAWN WITZKE Date of Exam: 07/28/2021 Medical Rec #:  [id number]      Height:       64.0 in Accession #:    [id number]  Weight:       152.5 lb Date of Birth:  [birth year]     BSA:          1.743 m Patient Age:    88 years       BP:           103/58 mmHg Patient Gender: M              HR:           66 bpm. Exam Location:  Inpatient  Procedure: 2D Echo  Indications:    Nstemi  History:        Patient has prior history of Echocardiogram examinations, most recent 09/24/2018. Risk Factors:Hypertension.  Sonographer:    Jefferey Pica Referring Phys: [id number]  A ARIDA   Sonographer Comments: Image acquisition challenging due to respiratory motion. IMPRESSIONS   1. Akinesis of the basal inferior wall with overall preserved LV function; calcified aortic valve with very mild AS (mean gradient 9 mmHg). 2. Left ventricular ejection fraction, by estimation, is 55 to 60%. The left ventricle has normal function. The left ventricle demonstrates regional wall motion abnormalities (see scoring diagram/findings for description). Left ventricular diastolic parameters are consistent with Grade I diastolic dysfunction (impaired relaxation). 3. Right ventricular systolic function is normal. The right ventricular size is normal. There is normal pulmonary artery systolic pressure. 4. The mitral valve is normal in structure. Trivial mitral valve regurgitation. No evidence of mitral stenosis. 5. The aortic valve is calcified. Aortic valve regurgitation is mild. No aortic stenosis is present. 6. Aortic dilatation noted. There is borderline dilatation of the aortic root, measuring 39 mm. 7. The inferior vena cava is normal in size with greater than 50% respiratory variability, suggesting right atrial pressure of 3 mmHg.  Comparison(s): Prior images unable to be directly viewed, comparison made by report only.  FINDINGS Left Ventricle: Left ventricular ejection fraction, by estimation, is 55 to 60%. The left ventricle has normal function. The left ventricle demonstrates regional wall motion abnormalities. The left ventricular internal cavity size was normal in size. There is no left ventricular hypertrophy. Left ventricular diastolic parameters are consistent with Grade I diastolic dysfunction (impaired relaxation).  Right Ventricle: The right ventricular size is normal. Right ventricular systolic function is normal. There is normal pulmonary artery systolic pressure. The tricuspid regurgitant velocity is 1.89 m/s, and with an assumed right atrial pressure of 15 mmHg, the  estimated right ventricular systolic pressure is 02.7 mmHg.  Left Atrium: Left atrial size was normal in size.  Right Atrium: Right atrial size was normal in size.  Pericardium: There is no evidence of pericardial effusion.  Mitral Valve: The mitral valve is normal in structure. Mild mitral annular calcification. Trivial mitral valve regurgitation. No evidence of mitral valve stenosis.  Tricuspid Valve: The tricuspid valve is normal in structure. Tricuspid valve regurgitation is mild . No evidence of tricuspid stenosis.  Aortic Valve: The aortic valve is calcified. Aortic valve regurgitation is mild. Aortic regurgitation PHT measures 697 msec. No aortic stenosis is present. Aortic valve mean gradient measures 9.0 mmHg. Aortic valve peak gradient measures 17.7 mmHg. Aortic valve area, by VTI measures 2.20 cm.  Pulmonic Valve: The pulmonic valve was normal in structure. Pulmonic valve regurgitation is not visualized. No evidence of pulmonic stenosis.  Aorta: Aortic dilatation noted. There is borderline dilatation of the aortic root, measuring 39 mm.  Venous: The inferior vena cava is normal in size with greater than 50% respiratory variability, suggesting right atrial  pressure of 3 mmHg.  IAS/Shunts: The interatrial septum is aneurysmal. No atrial level shunt detected by color flow Doppler.  Additional Comments: Akinesis of the basal inferior wall with overall preserved LV function; calcified aortic valve with very mild AS (mean gradient 9 mmHg).   LEFT VENTRICLE PLAX 2D LVIDd:         4.50 cm   Diastology LVIDs:         2.80 cm   LV e' medial:    5.74 cm/s LV PW:         0.90 cm   LV E/e' medial:  8.9 LV IVS:        1.00 cm   LV e' lateral:   8.59 cm/s LVOT diam:     2.00 cm   LV E/e' lateral: 5.9 LV SV:         102 LV SV Index:   59 LVOT Area:     3.14 cm   RIGHT VENTRICLE             IVC RV Basal diam:  2.50 cm     IVC diam: 2.20 cm RV S prime:     10.30 cm/s TAPSE  (M-mode): 1.3 cm  LEFT ATRIUM             Index        RIGHT ATRIUM           Index LA diam:        3.40 cm 1.95 cm/m   RA Area:     12.90 cm LA Vol (A2C):   48.8 ml 27.99 ml/m  RA Volume:   30.60 ml  17.55 ml/m LA Vol (A4C):   37.1 ml 21.28 ml/m LA Biplane Vol: 43.0 ml 24.66 ml/m AORTIC VALVE                     PULMONIC VALVE AV Area (Vmax):    2.13 cm      PV Vmax:       0.57 m/s AV Area (Vmean):   2.14 cm      PV Peak grad:  1.3 mmHg AV Area (VTI):     2.20 cm AV Vmax:           210.50 cm/s AV Vmean:          140.500 cm/s AV VTI:            0.465 m AV Peak Grad:      17.7 mmHg AV Mean Grad:      9.0 mmHg LVOT Vmax:         143.00 cm/s LVOT Vmean:        95.600 cm/s LVOT VTI:          0.325 m LVOT/AV VTI ratio: 0.70 AI PHT:            697 msec  AORTA Ao Root diam: 3.90 cm Ao Asc diam:  3.40 cm  MITRAL VALVE                TRICUSPID VALVE MV Area (PHT): 4.04 cm     TR Peak grad:   14.3 mmHg MV Decel Time: 188 msec     TR Vmax:        189.00 cm/s MV E velocity: 51.00 cm/s MV A velocity: 102.00 cm/s  SHUNTS MV E/A ratio:  0.50         Systemic VTI:  0.32 m Systemic Diam: 2.00 cm  Kirk Ruths  MD Electronically signed by Kirk Ruths MD Signature Date/Time: 07/28/2021/1:07:43 PM    Final   TEE  ECHO INTRAOPERATIVE TEE 08/04/2021  Narrative *INTRAOPERATIVE TRANSESOPHAGEAL REPORT *    Patient Name:   GASPAR FOWLE Date of Exam: 08/01/2021 Medical Rec #:  [id number]      Height:       64.0 in Accession #:    [id number]     Weight:       155.9 lb Date of Birth:  1933-11-13     BSA:          1.76 m Patient Age:    52 years       BP:           130/89 mmHg Patient Gender: M              HR:           85 bpm. Exam Location:  Inpatient  Transesophogeal exam was perform intraoperatively during surgical procedure. Patient was closely monitored under general anesthesia during the entirety of examination.  Indications:     coronary artery bypass  surgery Performing Phys: [id number] Diagnosing Phys: Belinda Block MD  POST-OP IMPRESSIONS _ Left Ventricle: Post Bypass: The patient came off bypass with moderate right ventricle systolic dysfunction with dilated right ventricle with inotrophe support. The TEE that was placed after induction without difficulty was removed at the end of the case uneventfully. The patient was later taken to the SICU in stable condition. _ Right Ventricle: moderately reduced function. Moderate right ventricular systolic dysfunction that improved with inotropes.  PRE-OP FINDINGS Left Ventricle: The left ventricle has normal systolic function, with an ejection fraction of 60-65%. The cavity size was left ventricular size was not assessed.   Mitral Valve: The mitral valve is normal in structure.  Tricuspid Valve: The tricuspid valve was normal in structure.  Aortic Valve: The aortic valve is tricuspid Aortic valve regurgitation is mild by color flow Doppler. There is moderate calcification present.  +--------------+--------++ LEFT VENTRICLE         +--------------+--------++ PLAX 2D                +--------------+--------++ LVOT diam:    2.00 cm  +--------------+--------++ LVOT Area:    3.14 cm +--------------+--------++                        +--------------+--------++  +-------------+------------++ AORTIC VALVE              +-------------+------------++ AV Vmax:     203.00 cm/s  +-------------+------------++ AV Vmean:    133.000 cm/s +-------------+------------++ AV VTI:      0.381 m      +-------------+------------++ AV Peak Grad:16.5 mmHg    +-------------+------------++ AV Mean Grad:8.0 mmHg     +-------------+------------++ AR PHT:      293 msec     +-------------+------------++   +--------------+-------+ SHUNTS                +--------------+-------+ Systemic Diam:2.00 cm +--------------+-------+   Belinda Block MD Electronically signed by Belinda Block MD Signature Date/Time: 08/04/2021/8:34:08 PM    Final             History of Present Illness:   Frederic Tones Cogar is a 88 y.o. male with the above problem list.  He was last seen by Dr. Acie Fredrickson in April 2023. He returns for f/u. He is here alone.  He notes he weight 171 pounds  when he went in the hospital in January for his heart attack.  He now weighs 129.  His appetite has decreased.  He does get nauseated when he eats certain meals.  He plans to call his gastroenterologist for follow-up soon.  He also notes occasional blood in his urine.  He used to see urology years ago.  I have encouraged him to follow-up with urology.  He has not had chest pain, shortness of breath, syncope, orthopnea, leg edema.         Past Medical History:  Diagnosis Date   (HFpEF) heart failure with preserved ejection fraction ([place])    Echo 1/23: Inferior AK, mild aortic stenosis (mean 9 mmHg, V-max 210.5 cm/s, DI 0.70), EF 55-60, GR 1 DD, normal RVSF, normal PASP, trivial MR, mild AI, borderline dilation of aortic root (39 mm)   Acid reflux    hiatal hernia   CAD (coronary artery disease)    S/p non-STEMI 1/23 >> s/p CABG   Carotid artery disease ([place]) 08/23/2021   Pre-CABG Dopplers 1/23:R ICA 40-59; L ICA 1-39   Chronic kidney disease (CKD)    Coronary artery disease involving native coronary artery of native heart without angina pectoris 05/16/2022   Inf NSTEMI s/p CABG in 07/2021 (RIMA-LAD, LIMA-OM1, S-OM2)   Diverticulitis    Erectile dysfunction 04/20/2015   Essential hypertension 04/20/2015   History of colon polyps    History of stroke    Noted on brain MRI 07/2021   Hyperlipidemia 05/28/2015   PAD (peripheral artery disease) ([place])    Pre-CABG Dopplers 1/23: Right ABI 0.65; left ABI 0.82   Current Medications: Current Meds  Medication Sig   aspirin EC 81 MG tablet Take 1 tablet (81 mg total) by mouth daily. Swallow whole.   clopidogrel (PLAVIX) 75  MG tablet Take 1 tablet (75 mg total) by mouth daily.   ezetimibe (ZETIA) 10 MG tablet Take 1 tablet (10 mg total) by mouth daily.   furosemide (LASIX) 20 MG tablet Take 1 tablet (20 mg total) by mouth daily as needed for fluid or edema.   metoprolol tartrate (LOPRESSOR) 25 MG tablet Take 1 tablet by mouth twice daily   nitroGLYCERIN (NITROSTAT) 0.4 MG SL tablet Place 1 tablet (0.4 mg total) under the tongue every 5 (five) minutes as needed for chest pain.   rosuvastatin (CRESTOR) 20 MG tablet Take 1 tablet (20 mg total) by mouth daily.   [DISCONTINUED] furosemide (LASIX) 40 MG tablet Take 0.5 tablets (20 mg total) by mouth daily.    Allergies:   Lipitor [atorvastatin]   Social History   Tobacco Use   Smoking status: Former    Types: Cigars   Smokeless tobacco: Never  Vaping Use   Vaping Use: Never used  Substance Use Topics   Alcohol use: No   Drug use: No    Family Hx: The patient's family history includes Stroke (age of onset: 12) in his father; Sudden death (age of onset: 21) in his mother.  Review of Systems  Gastrointestinal:  Negative for hematochezia and melena.  Genitourinary:  Positive for hematuria.     EKGs/Labs/Other Test Reviewed:    EKG:  EKG is not ordered today.     Recent Labs: 07/27/2021: B Natriuretic Peptide 51.3; TSH 1.029 08/02/2021: Magnesium 2.7 09/14/2021: Hemoglobin 9.9; Platelets 236 11/23/2021: ALT 15 12/09/2021: BUN 29; Creatinine, Ser 1.60; Potassium 4.7; Sodium 140   Recent Lipid Panel Recent Labs    07/28/21 0301 11/23/21 1012  CHOL 162 130  TRIG 54 58  HDL 57 62  VLDL 11  --   LDLCALC 94 55      Risk Assessment/Calculations/Metrics:              Physical Exam:    VS:  BP (!) 100/50   Pulse 68   Ht '5\' 4"'$  (1.626 m)   Wt 129 lb 3.2 oz (58.6 kg)   SpO2 98%   BMI 22.18 kg/m     Wt Readings from Last 3 Encounters:  05/16/22 129 lb 3.2 oz (58.6 kg)  04/10/22 142 lb (64.4 kg)  01/19/22 146 lb (66.2 kg)    Constitutional:       Appearance: Healthy appearance. Not in distress.  Neck:     Vascular: No JVR. JVD normal.  Pulmonary:     Effort: Pulmonary effort is normal.     Breath sounds: No wheezing. No rales.  Cardiovascular:     Normal rate. Regular rhythm. Normal S1. Normal S2.      Murmurs: There is a grade 2/6 crescendo-decrescendo systolic murmur at the URSB and LLSB.  Edema:    Peripheral edema absent.  Abdominal:     Palpations: Abdomen is soft.  Skin:    General: Skin is warm and dry.  Neurological:     General: No focal deficit present.     Mental Status: Alert and oriented to person, place and time.         ASSESSMENT & PLAN:   Coronary artery disease involving native coronary artery of native heart without angina pectoris Status post non-STEMI in January 2023 followed by CABG.  He is doing well without anginal symptoms.  Continue aspirin 81 mg daily, Plavix 75 mg daily, Zetia 10 mg daily, metoprolol tartrate 25 mg twice daily, Crestor 20 mg daily.  Follow-up in 6 months.  He will be > 1 year out from his MI at that point and should be able to DC the Plavix.   (HFpEF) heart failure with preserved ejection fraction (HCC) NYHA II.  Volume status stable.  He has lost a significant mount of weight since prior to his heart attack.  I think we can change his furosemide to as needed. Change furosemide to 20 mg daily as needed for leg edema or weight gain greater than 3 pounds in 1 day.  Aortic stenosis Mild aortic stenosis by echocardiogram January 2023 (mean gradient 9 mmHg).  Plan repeat echocardiogram in 2 to 3 years.  Carotid artery disease ([place]) Carotid US in January 2023 with 40-59% right ICA stenosis.  Continue aspirin, statin therapy.  Arrange follow-up carotid US in January 2024.  Essential hypertension Blood pressure somewhat low.  He is asymptomatic.  Change Lasix to as needed as noted.  Continue metoprolol tartrate 25 mg twice daily.  Hyperlipidemia LDL goal <70 Lipids optimal.   Continue Zetia 10 mg daily, Crestor 20 mg daily.  CKD (chronic kidney disease) stage 3, GFR 30-59 ml/min (HCC) GFR in June 2023 was 42.  Loss of weight He has lost ~ 50 lbs since Jan 2023. He notes nausea with certain meals as well. He plans to see GI. I have encouraged him to f/u with primary care and GI.  Painless hematuria He notes a hx of this from time to time. I have asked him to follow up with urology for evaluation.            Dispo:  Return in about 6 months (around 11/14/2022) for Routine Follow Up, w/ Dr. Acie Fredrickson.  Medication Adjustments/Labs and Tests Ordered: Current medicines are reviewed at length with the patient today.  Concerns regarding medicines are outlined above.  Tests Ordered: Orders Placed This Encounter  Procedures   VAS US CAROTID   Medication Changes: Meds ordered this encounter  Medications   furosemide (LASIX) 20 MG tablet    Sig: Take 1 tablet (20 mg total) by mouth daily as needed for fluid or edema.    Dispense:  90 tablet    Refill:  1   nitroGLYCERIN (NITROSTAT) 0.4 MG SL tablet    Sig: Place 1 tablet (0.4 mg total) under the tongue every 5 (five) minutes as needed for chest pain.    Dispense:  25 tablet    Refill:  3   Signed, [name], PA-C  05/16/2022 3:57 PM    [address], [place], [place]  [address] Phone: [phone]; Fax: [phone]

## 2022-05-16 NOTE — Patient Instructions (Addendum)
Medication Instructions:  Your physician has recommended you make the following change in your medication:  CHANGE the Lasix to only as needed for swelling / weight gain   *If you need a refill on your cardiac medications before your next appointment, please call your pharmacy*   Lab Work: None ordered  If you have labs (blood work) drawn today and your tests are completely normal, you will receive your results only by: [place] (if you have MyChart) OR A paper copy in the mail If you have any lab test that is abnormal or we need to change your treatment, we will call you to review the results.   Testing/Procedures: Your physician has requested that you have a carotid duplex. This test is an ultrasound of the carotid arteries in your neck. It looks at blood flow through these arteries that supply the brain with blood. Allow one hour for this exam. There are no restrictions or special instructions.    Follow-Up: At [facility], you and your health needs are our priority.  As part of our continuing mission to provide you with exceptional heart care, we have created designated Provider Care Teams.  These Care Teams include your primary Cardiologist (physician) and Advanced Practice Providers (APPs -  Physician Assistants and Nurse Practitioners) who all work together to provide you with the care you need, when you need it.  We recommend signing up for the patient portal called "MyChart".  Sign up information is provided on this After Visit Summary.  MyChart is used to connect with patients for Virtual Visits (Telemedicine).  Patients are able to view lab/test results, encounter notes, upcoming appointments, etc.  Non-urgent messages can be sent to your provider as well.   To learn more about what you can do with MyChart, go to [email or web address].    Your next appointment:   6 month(s)  The format for your next appointment:   In Person  Provider:       Other  Instructions Please remember to call you GI Dr and Urologist regarding your symptoms   Important Information About Sugar

## 2022-05-16 NOTE — Assessment & Plan Note (Addendum)
Lipids optimal.  Continue Zetia 10 mg daily, Crestor 20 mg daily.

## 2022-05-16 NOTE — Assessment & Plan Note (Signed)
Status post non-STEMI in January 2023 followed by CABG.  He is doing well without anginal symptoms.  Continue aspirin 81 mg daily, Plavix 75 mg daily, Zetia 10 mg daily, metoprolol tartrate 25 mg twice daily, Crestor 20 mg daily.  Follow-up in 6 months.  He will be > 1 year out from his MI at that point and should be able to DC the Plavix.

## 2022-05-16 NOTE — Assessment & Plan Note (Signed)
GFR in June 2023 was 42.

## 2022-06-24 ENCOUNTER — Other Ambulatory Visit: Payer: Self-pay | Admitting: Gastroenterology

## 2022-07-12 IMAGING — DX DG CHEST 1V PORT
1 series · 1 of 1 positions shown · non-contrast
Comparison: Yesterday

CLINICAL DATA: Status post CABG

EXAM:
PORTABLE CHEST 1 VIEW

[chest ap]
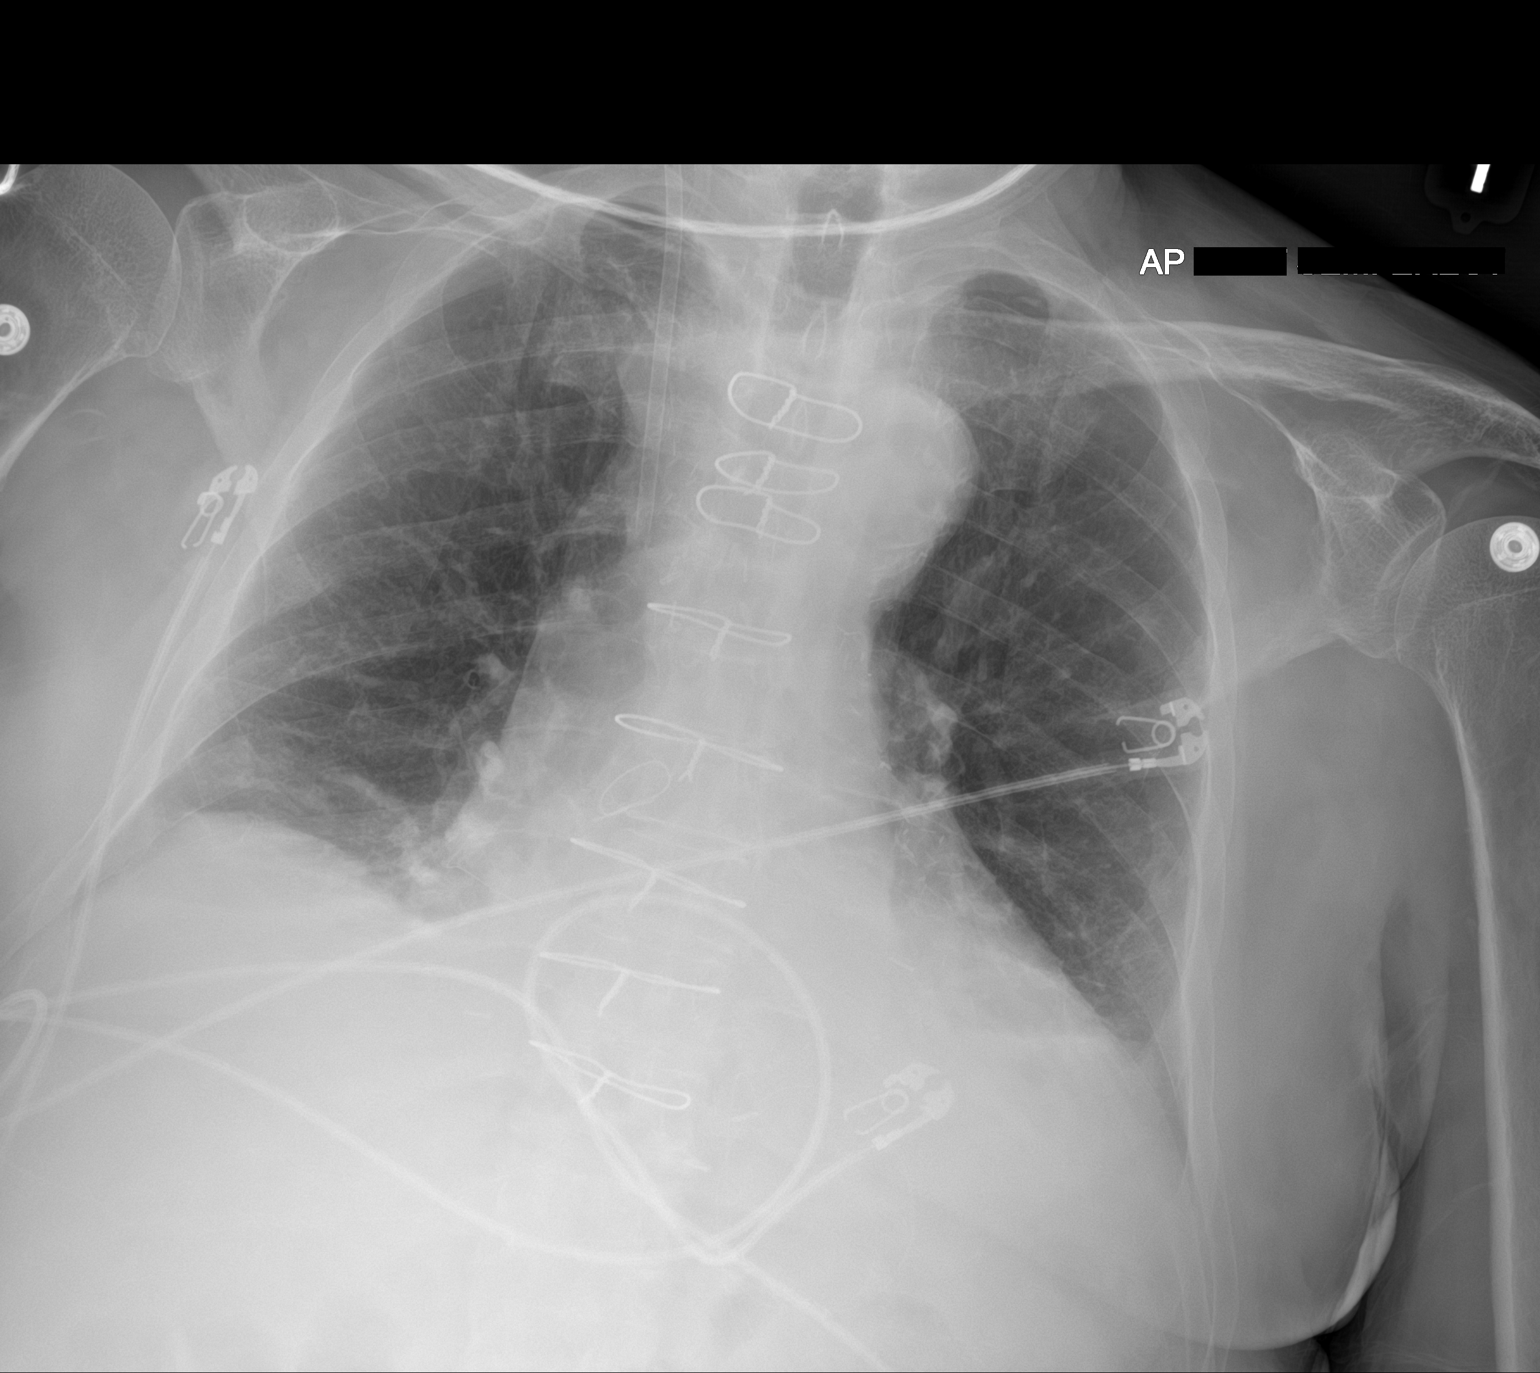

[1 of 1 positions shown; findings below may reference images not displayed]

FINDINGS: Right IJ Cordis sheath is unchanged.

Prior median sternotomy. Midline trachea. Mild cardiomegaly.
Atherosclerosis in the transverse aorta. No pleural effusion or
pneumothorax. No congestive failure. Similar left and decreased
right base atelectasis.
IMPRESSION: Slightly improved right base aeration with persistent bibasilar
atelectasis.

Cardiomegaly without congestive failure.

Aortic Atherosclerosis ([id number]).

## 2022-07-13 IMAGING — DX DG CHEST 1V PORT
1 series · 1 of 1 positions shown · non-contrast
Comparison: Previous studies including the examination of
08/05/2021

CLINICAL DATA: Coronary bypass surgery

EXAM:
PORTABLE CHEST 1 VIEW

[chest ap]
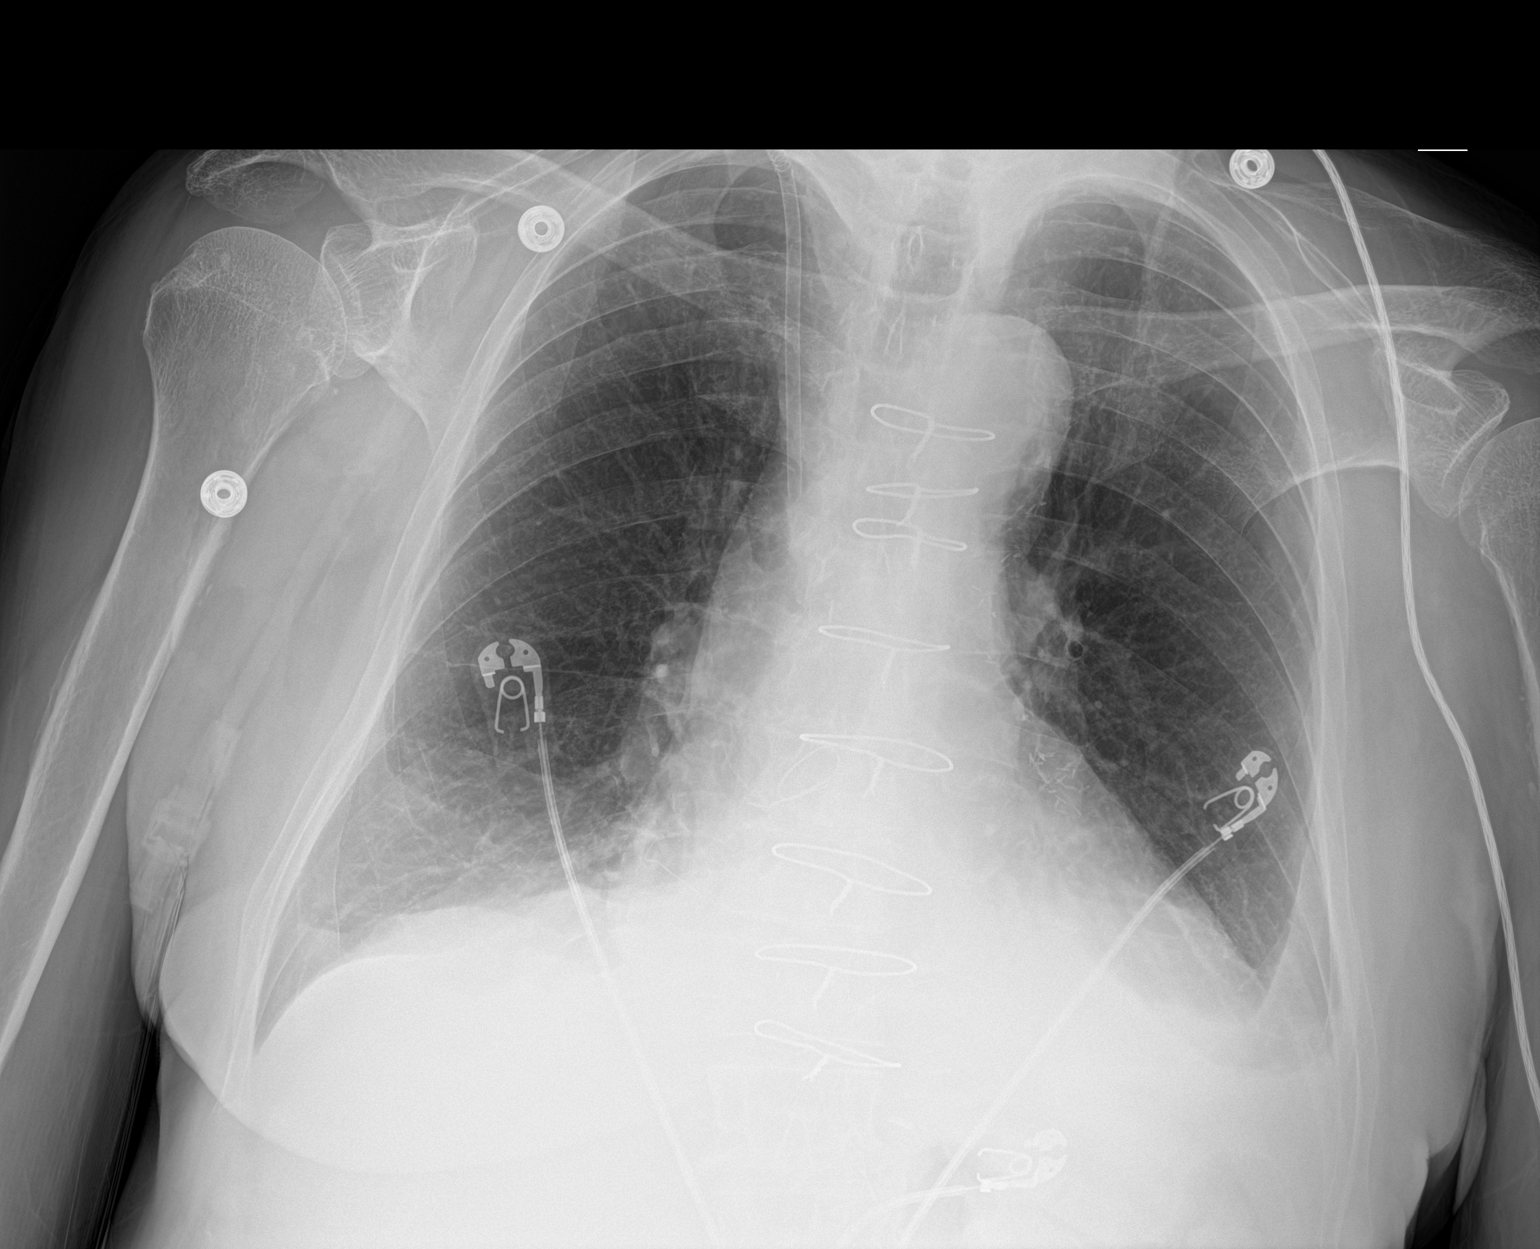

[1 of 1 positions shown; findings below may reference images not displayed]

FINDINGS: Transverse diameter of heart is increased. Tip of right IJ central
venous catheter is seen in the superior vena cava. Metallic sutures
seen in the sternum. Increased density in the left lower lung fields
suggests pleural effusion and possibly underlying infiltrate. There
is faint haziness in the right lower lung fields suggesting layering
of small pleural effusion. There is no pneumothorax.
IMPRESSION: There are no signs of pulmonary edema. Bilateral pleural effusions,
more so on the left side. There is no pneumothorax.

## 2022-07-17 ENCOUNTER — Ambulatory Visit: Payer: Medicare PPO | Admitting: Podiatry

## 2022-07-17 ENCOUNTER — Encounter: Payer: Self-pay | Admitting: Podiatry

## 2022-07-17 VITALS — BP 128/82

## 2022-07-17 DIAGNOSIS — B351 Tinea unguium: Secondary | ICD-10-CM | POA: Diagnosis not present

## 2022-07-17 DIAGNOSIS — L84 Corns and callosities: Secondary | ICD-10-CM

## 2022-07-17 DIAGNOSIS — I739 Peripheral vascular disease, unspecified: Secondary | ICD-10-CM | POA: Diagnosis not present

## 2022-07-17 DIAGNOSIS — M79675 Pain in left toe(s): Secondary | ICD-10-CM | POA: Diagnosis not present

## 2022-07-17 DIAGNOSIS — M79674 Pain in right toe(s): Secondary | ICD-10-CM | POA: Diagnosis not present

## 2022-07-17 NOTE — Progress Notes (Signed)
  Subjective:  Patient ID: Danny [name], male    DOB: Jun 08, 1934,   MRN: [id number]  Chief Complaint  Patient presents with   Nail Problem    Nail trim     89 y.o. male presents for concern of thickened elongated and painful nails that are difficult to trim. Requesting to have them trimmed today. Denies any burning or tingling in her feet. He has a history of heart surgery  and PAD.   Marland Kitchen Denies any other pedal complaints. Denies n/v/f/c.   Past Medical History:  Diagnosis Date   (HFpEF) heart failure with preserved ejection fraction ([place])    Echo 1/23: Inferior AK, mild aortic stenosis (mean 9 mmHg, V-max 210.5 cm/s, DI 0.70), EF 55-60, GR 1 DD, normal RVSF, normal PASP, trivial MR, mild AI, borderline dilation of aortic root (39 mm)   Acid reflux    hiatal hernia   CAD (coronary artery disease)    S/p non-STEMI 1/23 >> s/p CABG   Carotid artery disease ([place]) 08/23/2021   Pre-CABG Dopplers 1/23:R ICA 40-59; L ICA 1-39   Chronic kidney disease (CKD)    Coronary artery disease involving native coronary artery of native heart without angina pectoris 05/16/2022   Inf NSTEMI s/p CABG in 07/2021 (RIMA-LAD, LIMA-OM1, S-OM2)   Diverticulitis    Erectile dysfunction 04/20/2015   Essential hypertension 04/20/2015   History of colon polyps    History of stroke    Noted on brain MRI 07/2021   Hyperlipidemia 05/28/2015   PAD (peripheral artery disease) ([place])    Pre-CABG Dopplers 1/23: Right ABI 0.65; left ABI 0.82    Objective:  Physical Exam: Vascular: DP/PT pulses 1/4 bilateral (edema). CFT <3 seconds. Absent hair growth. Bilateral edema and xerosis noted.  Skin. No lacerations or abrasions bilateral feet. Hyperkeratotic tissue and scabbing noted to dorsum of foot upon debridement no wound noted to be present. Area covered with bandaid at present. Nails 1-5 bilateral are thickened discolored with subungual debris.  Musculoskeletal: MMT 5/5 bilateral lower extremities in DF, PF, Inversion  and Eversion. Deceased ROM in DF of ankle joint.  Neurological: Sensation intact to light touch.   Assessment:   1. Pain due to onychomycosis of toenails of both feet   2. Pre-ulcerative calluses   3. PAD (peripheral artery disease) ([place])       Plan:  Patient was evaluated and treated and all questions answered. -Discussed and educated patient on foot care, especially with  regards to the vascular, neurological and musculoskeletal systems.  -Discussed supportive shoes at all times and checking feet regularly.  -Mechanically debrided all nails 1-5 bilateral using sterile nail nipper and filed with dremel without incident  -Answered all patient questions -Patient to return  in 3 months for at risk foot care -Patient advised to call the office if any problems or questions arise in the meantime.   Lorenda Peck, DPM

## 2022-07-19 ENCOUNTER — Ambulatory Visit (HOSPITAL_COMMUNITY)
Admission: RE | Admit: 2022-07-19 | Discharge: 2022-07-19 | Disposition: A | Discharge status: Home / Self Care | Payer: Medicare PPO | Source: Ambulatory Visit | Attending: Cardiology | Admitting: Cardiology

## 2022-07-19 DIAGNOSIS — E785 Hyperlipidemia, unspecified: Secondary | ICD-10-CM | POA: Diagnosis not present

## 2022-07-19 DIAGNOSIS — I350 Nonrheumatic aortic (valve) stenosis: Secondary | ICD-10-CM | POA: Diagnosis not present

## 2022-07-19 DIAGNOSIS — N1831 Chronic kidney disease, stage 3a: Secondary | ICD-10-CM | POA: Insufficient documentation

## 2022-07-19 DIAGNOSIS — I10 Essential (primary) hypertension: Secondary | ICD-10-CM | POA: Diagnosis not present

## 2022-07-19 DIAGNOSIS — I5032 Chronic diastolic (congestive) heart failure: Secondary | ICD-10-CM | POA: Diagnosis not present

## 2022-07-19 DIAGNOSIS — I2510 Atherosclerotic heart disease of native coronary artery without angina pectoris: Secondary | ICD-10-CM | POA: Diagnosis not present

## 2022-07-19 DIAGNOSIS — I6523 Occlusion and stenosis of bilateral carotid arteries: Secondary | ICD-10-CM | POA: Insufficient documentation

## 2022-07-24 ENCOUNTER — Telehealth: Payer: Self-pay

## 2022-07-24 NOTE — Telephone Encounter (Signed)
-----  Message from Liliane Shi, [place] sent at 07/23/2022  8:50 PM EST ----- Stable plaque in both carotid arteries (R 40-59%; L 1-39%).  I have sent a copy to his PCP as an FYI PLAN:  - Continue current medications/treatment plan and follow up as scheduled.  - Repeat Carotid US in 1 year.  [name], PA-C    07/23/2022 8:47 PM

## 2022-07-26 ENCOUNTER — Telehealth: Payer: Self-pay | Admitting: *Deleted

## 2022-07-26 DIAGNOSIS — I6523 Occlusion and stenosis of bilateral carotid arteries: Secondary | ICD-10-CM

## 2022-07-26 NOTE — Telephone Encounter (Signed)
Attempted to reach patient, unable to leave message.

## 2022-07-26 NOTE — Telephone Encounter (Signed)
-----  Message from Precious Gilding, RN sent at 07/26/2022  2:17 PM EST -----  ----- Message ----- From: Liliane Shi, PA-C Sent: 07/23/2022   8:50 PM EST To: [facility] Triage  Stable plaque in both carotid arteries (R 40-59%; L 1-39%).  I have sent a copy to his PCP as an FYI PLAN:  - Continue current medications/treatment plan and follow up as scheduled.  - Repeat Carotid US in 1 year.  [name], PA-C    07/23/2022 8:47 PM

## 2022-08-07 DIAGNOSIS — L821 Other seborrheic keratosis: Secondary | ICD-10-CM | POA: Diagnosis not present

## 2022-08-07 DIAGNOSIS — D692 Other nonthrombocytopenic purpura: Secondary | ICD-10-CM | POA: Diagnosis not present

## 2022-08-07 DIAGNOSIS — D1801 Hemangioma of skin and subcutaneous tissue: Secondary | ICD-10-CM | POA: Diagnosis not present

## 2022-08-07 DIAGNOSIS — Z85828 Personal history of other malignant neoplasm of skin: Secondary | ICD-10-CM | POA: Diagnosis not present

## 2022-08-07 DIAGNOSIS — L570 Actinic keratosis: Secondary | ICD-10-CM | POA: Diagnosis not present

## 2022-08-07 DIAGNOSIS — D044 Carcinoma in situ of skin of scalp and neck: Secondary | ICD-10-CM | POA: Diagnosis not present

## 2022-08-07 DIAGNOSIS — L814 Other melanin hyperpigmentation: Secondary | ICD-10-CM | POA: Diagnosis not present

## 2022-08-14 IMAGING — DX DG CHEST 2V
2 series · 2 of 2 positions shown · non-contrast
Comparison: August 06, 2021

CLINICAL DATA: Post CABG.

EXAM:
CHEST - 2 VIEW

[dg chest 2 view (1 of 2)]
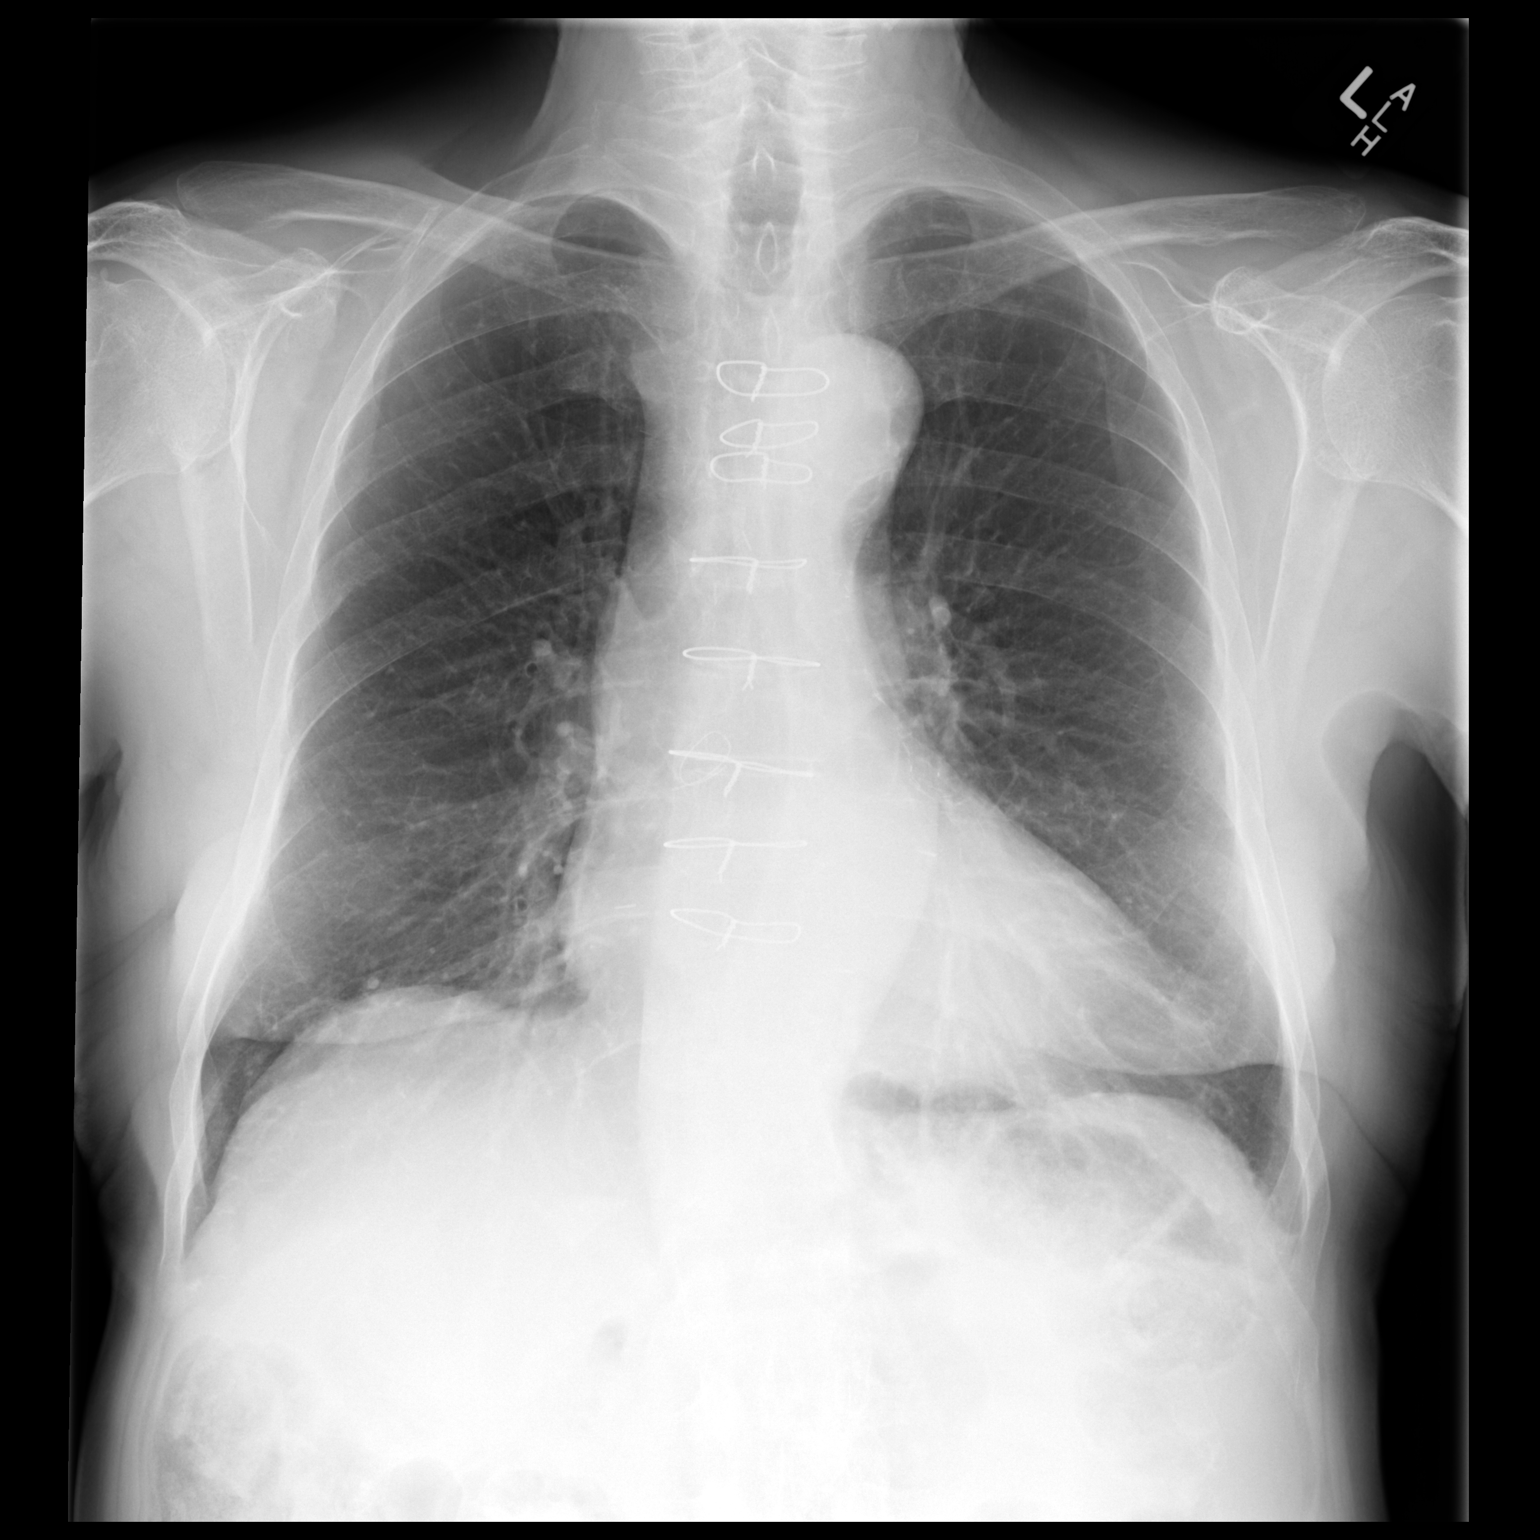

[dg chest 2 view (2 of 2)]
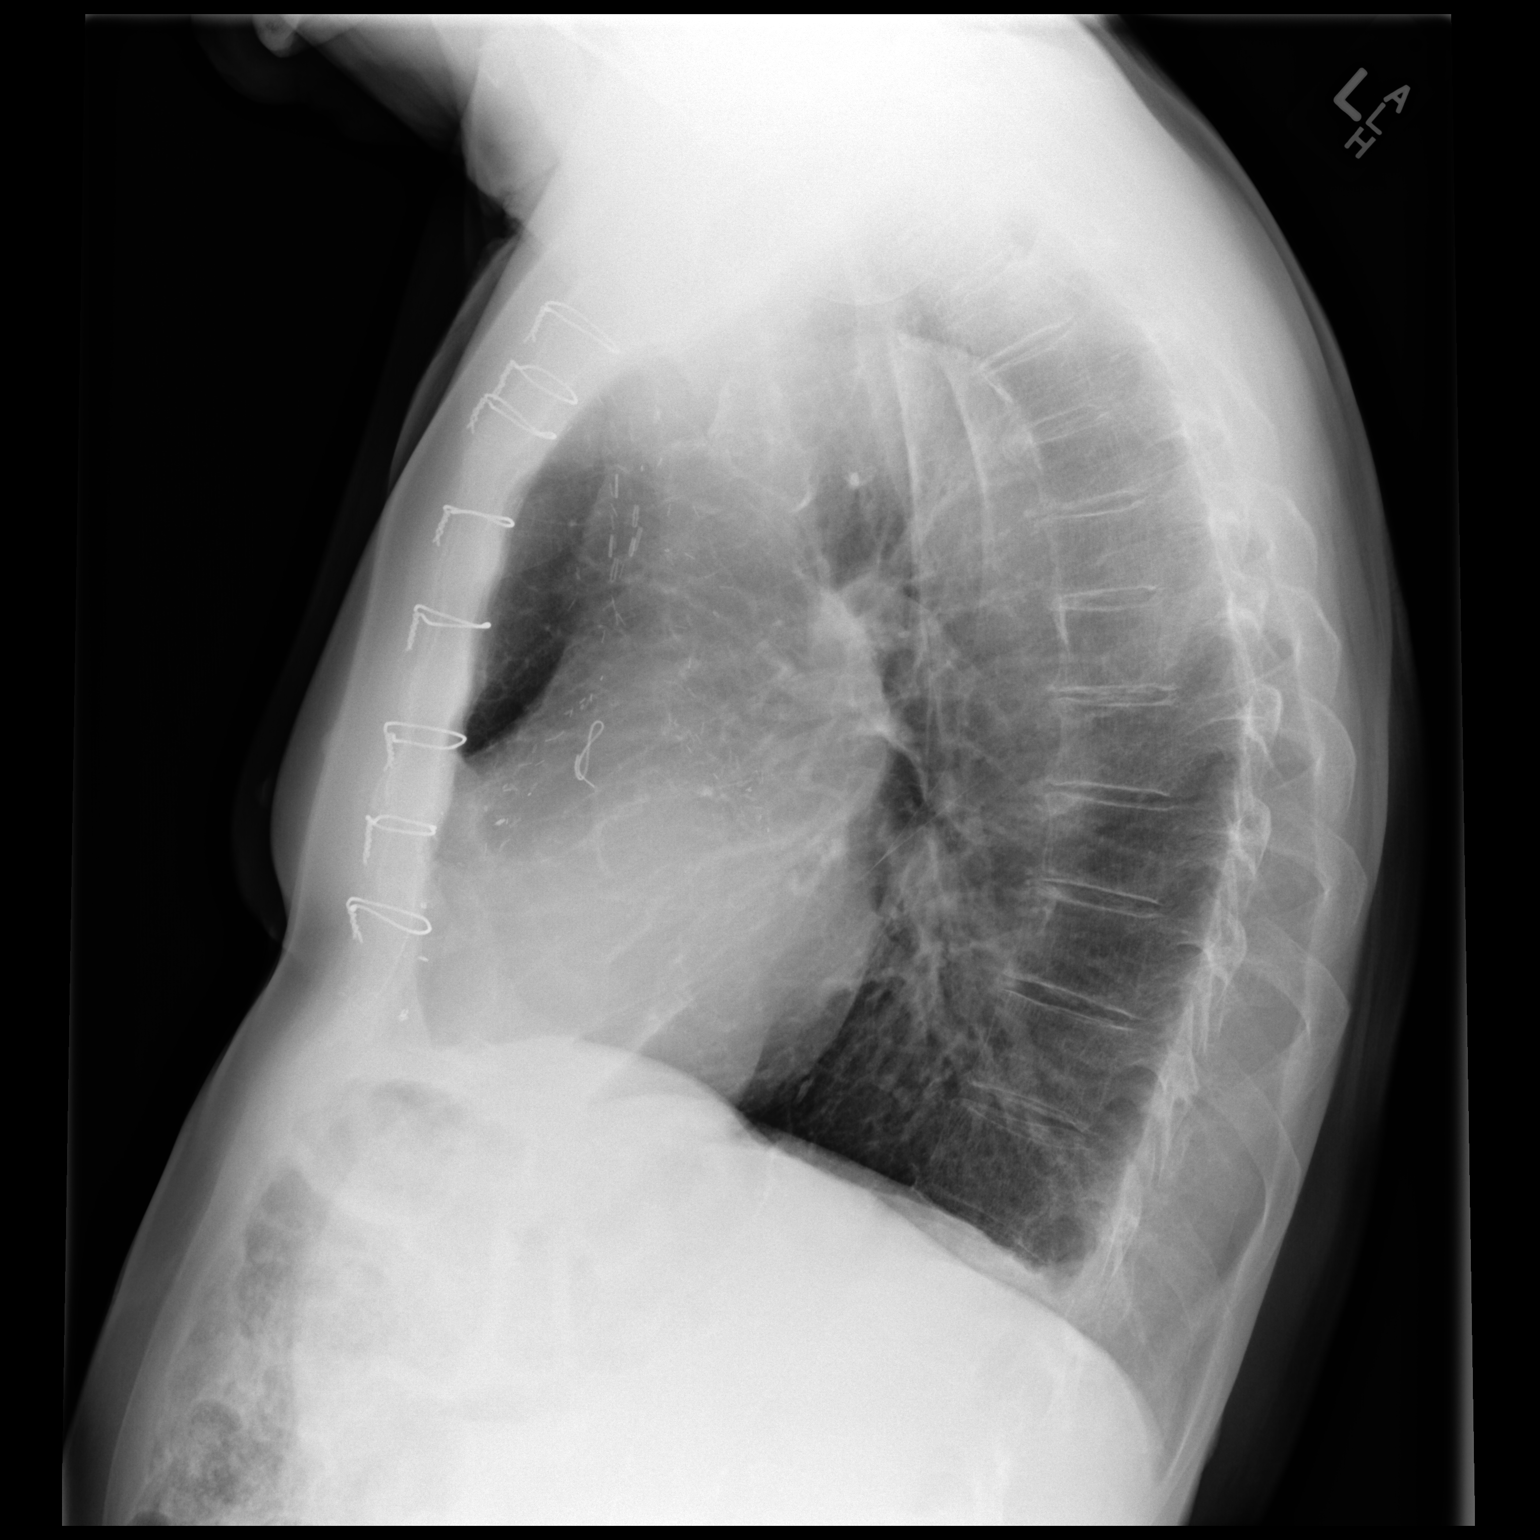

[2 of 2 positions shown; findings below may reference images not displayed]

FINDINGS: Postsurgical changes from CABG.

Tortuosity and calcific atherosclerotic disease of the aorta.

Normal cardiac silhouette.

There is no evidence of focal airspace consolidation, pleural
effusion or pneumothorax. Minimal residual left pleural thickening.

Osseous structures are without acute abnormality. Soft tissues are
grossly normal.
IMPRESSION: No active cardiopulmonary disease.

Minimal residual left pleural thickening in the costophrenic angle.
No radiographically apparent pleural effusion is seen.

## 2022-08-17 ENCOUNTER — Encounter: Payer: Self-pay | Admitting: Internal Medicine

## 2022-10-10 ENCOUNTER — Encounter: Payer: Self-pay | Admitting: Internal Medicine

## 2022-10-10 ENCOUNTER — Ambulatory Visit: Payer: Medicare PPO | Admitting: Internal Medicine

## 2022-10-10 VITALS — BP 122/78 | HR 86 | Temp 98.5°F | Ht 64.0 in | Wt 132.5 lb

## 2022-10-10 DIAGNOSIS — R319 Hematuria, unspecified: Secondary | ICD-10-CM | POA: Diagnosis not present

## 2022-10-10 DIAGNOSIS — E538 Deficiency of other specified B group vitamins: Secondary | ICD-10-CM | POA: Diagnosis not present

## 2022-10-10 DIAGNOSIS — I2510 Atherosclerotic heart disease of native coronary artery without angina pectoris: Secondary | ICD-10-CM

## 2022-10-10 DIAGNOSIS — R634 Abnormal weight loss: Secondary | ICD-10-CM | POA: Diagnosis not present

## 2022-10-10 DIAGNOSIS — E785 Hyperlipidemia, unspecified: Secondary | ICD-10-CM | POA: Diagnosis not present

## 2022-10-10 DIAGNOSIS — D539 Nutritional anemia, unspecified: Secondary | ICD-10-CM

## 2022-10-10 DIAGNOSIS — R1031 Right lower quadrant pain: Secondary | ICD-10-CM

## 2022-10-10 LAB — CBC
HCT: 28.1 % — ABNORMAL LOW (ref 39.0–52.0)
Hemoglobin: 9.7 g/dL — ABNORMAL LOW (ref 13.0–17.0)
MCHC: 34.4 g/dL (ref 30.0–36.0)
MCV: 99.7 fl (ref 78.0–100.0)
Platelets: 173 10*3/uL (ref 150.0–400.0)
RBC: 2.82 Mil/uL — ABNORMAL LOW (ref 4.22–5.81)
RDW: 15.3 % (ref 11.5–15.5)
WBC: 7.3 10*3/uL (ref 4.0–10.5)

## 2022-10-10 LAB — COMPREHENSIVE METABOLIC PANEL
ALT: 49 U/L (ref 0–53)
AST: 21 U/L (ref 0–37)
Albumin: 4 g/dL (ref 3.5–5.2)
Alkaline Phosphatase: 100 U/L (ref 39–117)
BUN: 25 mg/dL — ABNORMAL HIGH (ref 6–23)
CO2: 30 mEq/L (ref 19–32)
Calcium: 9.6 mg/dL (ref 8.4–10.5)
Chloride: 104 mEq/L (ref 96–112)
Creatinine, Ser: 1.2 mg/dL (ref 0.40–1.50)
GFR: 54.78 mL/min — ABNORMAL LOW (ref >60.00)
Glucose, Bld: 88 mg/dL (ref 70–99)
Potassium: 5.2 mEq/L — ABNORMAL HIGH (ref 3.5–5.1)
Sodium: 137 mEq/L (ref 135–145)
Total Bilirubin: 0.9 mg/dL (ref 0.2–1.2)
Total Protein: 6.9 g/dL (ref 6.0–8.3)

## 2022-10-10 LAB — FERRITIN: Ferritin: 184.8 ng/mL (ref 22.0–322.0)

## 2022-10-10 LAB — LIPID PANEL
Cholesterol: 192 mg/dL (ref 0–200)
HDL: 66.8 mg/dL (ref >39.00)
LDL Cholesterol: 114 mg/dL — ABNORMAL HIGH (ref 0–99)
NonHDL: 125.12
Total CHOL/HDL Ratio: 3
Triglycerides: 54 mg/dL (ref 0.0–149.0)
VLDL: 10.8 mg/dL (ref 0.0–40.0)

## 2022-10-10 LAB — VITAMIN B12: Vitamin B-12: 282 pg/mL (ref 211–911)

## 2022-10-10 NOTE — Assessment & Plan Note (Signed)
No recent CBC and concerning with intermittent hematuria he states almost daily and complaint going back to July 2023 at least. No urologist currently. Referral done. Checking CT abdomen and pelvis today given significant weight loss, tender RLQ, gross hematuria.

## 2022-10-10 NOTE — Assessment & Plan Note (Signed)
Down a concerning amount of weight. I last saw 2021 and he is not great historian unable to tell me when weight loss has occurred. He states he was 190 prior to cabg 2023 but last time I saw him he was 160 2021. He was weighed in at 140s July 2023 so is definitely losing weight. He states eating well. Denies new GI concerns. Prior colonoscopy with Collene Mares we do not have records he is unsure about year. With hematuria for unknown length of time. Checking CBC, CMP, ferritin, b12 today. Ordered CT abdomen/pelvis as this seems highest yield. Given lack of prior follow up this is ordered today in case patient does not return for close follow up which is encouraged in 1 month.

## 2022-10-10 NOTE — Progress Notes (Signed)
   Subjective:   Patient ID: Danny [name], male    DOB: 06-02-1934, 89 y.o.   MRN: [id number]  HPI The patient is an 89 YO man coming in for several concerning symptoms including weight loss (unclear timeframe is down 30 pounds since I saw him last 2021, down 15 pounds since July 2023) and blood in urine (seen for this and told cardiology about it at least once, has not had assessment last CBC more than 1 year ago) and stopped all meds about a week or two ago timeline unknown. Last seen 2021  Review of Systems  Constitutional:  Positive for unexpected weight change.  HENT: Negative.    Eyes: Negative.   Respiratory:  Negative for cough, chest tightness and shortness of breath.   Cardiovascular:  Negative for chest pain, palpitations and leg swelling.  Gastrointestinal:  Positive for abdominal pain. Negative for abdominal distention, constipation, diarrhea, nausea and vomiting.  Genitourinary:  Positive for hematuria.  Musculoskeletal: Negative.   Skin: Negative.   Neurological: Negative.   Psychiatric/Behavioral: Negative.      Objective:  Physical Exam Constitutional:      Appearance: He is well-developed.  HENT:     Head: Normocephalic and atraumatic.  Cardiovascular:     Rate and Rhythm: Normal rate and regular rhythm.     Comments: Cabg scar Pulmonary:     Effort: Pulmonary effort is normal. No respiratory distress.     Breath sounds: Normal breath sounds. No wheezing or rales.  Abdominal:     General: Bowel sounds are normal. There is no distension.     Palpations: Abdomen is soft.     Tenderness: There is no abdominal tenderness. There is no rebound.     Comments: Mild tenderness RLQ no rebound or guarding  Musculoskeletal:     Cervical back: Normal range of motion.  Skin:    General: Skin is warm and dry.  Neurological:     Mental Status: He is alert and oriented to person, place, and time.     Coordination: Coordination normal.     Vitals:   10/10/22 0804   BP: 122/78  Pulse: 86  Temp: 98.5 F (36.9 C)  TempSrc: Oral  SpO2: 99%  Weight: 132 lb 8 oz (60.1 kg)  Height: 5\' 4"  (1.626 m)    Assessment & Plan:  Visit time 25 minutes in face to face communication with patient and coordination of care, additional 15 minutes spent in record review, coordination or care, ordering tests, communicating/referring to other healthcare professionals, documenting in medical records all on the same day of the visit for total time 40 minutes spent on the visit.

## 2022-10-10 NOTE — Assessment & Plan Note (Signed)
He has stopped all medications. Advised to resume crestor 20 mg daily as this does not cause hematuria or weight loss. Checking lipid panel today.

## 2022-10-10 NOTE — Patient Instructions (Signed)
We are checking the labs today and will get a CT scan of the stomach to check for any problems.   We will also get you back in with the urologist to check the blood in the urine.  I will check with your heart doctor and likely you should be on the aspirin 81 mg daily.

## 2022-10-10 NOTE — Assessment & Plan Note (Signed)
Despite hematuria no CBC in the last year. Checking CBC, ferritin, B12 today. Treat as needed.

## 2022-10-10 NOTE — Assessment & Plan Note (Signed)
He has stopped all medications and states cardiology told him to stop plavix Jan 2024. He did so and stopped aspirin some time ago (maybe several weeks, unknown and poor historian). Will check CBC given ongoing hematuria and if stable resume aspirin 81 mg daily. Resume metoprolol now. Resume crestor now. He has no idea if he was taking lasix regularly or as needed.

## 2022-10-10 NOTE — Assessment & Plan Note (Signed)
No recent check, checking B12 level today.

## 2022-10-16 ENCOUNTER — Ambulatory Visit: Payer: Medicare PPO | Admitting: Podiatry

## 2022-10-16 ENCOUNTER — Encounter: Payer: Self-pay | Admitting: Podiatry

## 2022-10-16 DIAGNOSIS — M79675 Pain in left toe(s): Secondary | ICD-10-CM

## 2022-10-16 DIAGNOSIS — L84 Corns and callosities: Secondary | ICD-10-CM

## 2022-10-16 DIAGNOSIS — I739 Peripheral vascular disease, unspecified: Secondary | ICD-10-CM

## 2022-10-16 DIAGNOSIS — B351 Tinea unguium: Secondary | ICD-10-CM

## 2022-10-16 DIAGNOSIS — M79674 Pain in right toe(s): Secondary | ICD-10-CM | POA: Diagnosis not present

## 2022-10-16 NOTE — Progress Notes (Signed)
  Subjective:  Patient ID: Danny [name], male    DOB: [birth year],   MRN: [id number]  Chief Complaint  Patient presents with   Nail Problem    Routine foot care    89 y.o. male presents for concern of thickened elongated and painful nails that are difficult to trim. Requesting to have them trimmed today. Denies any burning or tingling in her feet. He has a history of heart surgery  and PAD.   Marland Kitchen Denies any other pedal complaints. Denies n/v/f/c.   Past Medical History:  Diagnosis Date   (HFpEF) heart failure with preserved ejection fraction    Echo 1/23: Inferior AK, mild aortic stenosis (mean 9 mmHg, V-max 210.5 cm/s, DI 0.70), EF 55-60, GR 1 DD, normal RVSF, normal PASP, trivial MR, mild AI, borderline dilation of aortic root (39 mm)   Acid reflux    hiatal hernia   CAD (coronary artery disease)    S/p non-STEMI 1/23 >> s/p CABG   Carotid artery disease 08/23/2021   Pre-CABG Dopplers 1/23:R ICA 40-59; L ICA 1-39   Chronic kidney disease (CKD)    Coronary artery disease involving native coronary artery of native heart without angina pectoris 05/16/2022   Inf NSTEMI s/p CABG in 07/2021 (RIMA-LAD, LIMA-OM1, S-OM2)   Diverticulitis    Erectile dysfunction 04/20/2015   Essential hypertension 04/20/2015   History of colon polyps    History of stroke    Noted on brain MRI 07/2021   Hyperlipidemia 05/28/2015   PAD (peripheral artery disease)    Pre-CABG Dopplers 1/23: Right ABI 0.65; left ABI 0.82    Objective:  Physical Exam: Vascular: DP/PT pulses 1/4 bilateral (edema). CFT <3 seconds. Absent hair growth. Bilateral edema and xerosis noted.  Skin. No lacerations or abrasions bilateral feet. Hyperkeratotic tissue and scabbing noted to dorsum of foot upon debridement no wound noted to be present. Nails 1-5 bilateral are thickened discolored with subungual debris.  Musculoskeletal: MMT 5/5 bilateral lower extremities in DF, PF, Inversion and Eversion. Deceased ROM in DF of ankle joint.   Neurological: Sensation intact to light touch.   Assessment:   1. Pain due to onychomycosis of toenails of both feet   2. Pre-ulcerative calluses   3. PAD (peripheral artery disease)        Plan:  Patient was evaluated and treated and all questions answered. -Discussed and educated patient on foot care, especially with  regards to the vascular, neurological and musculoskeletal systems.  -Discussed supportive shoes at all times and checking feet regularly.  -Mechanically debrided all nails 1-5 bilateral using sterile nail nipper and filed with dremel without incident  -Answered all patient questions -Patient to return  in 3 months for at risk foot care -Patient advised to call the office if any problems or questions arise in the meantime.   Louann Sjogren, DPM

## 2022-10-19 ENCOUNTER — Ambulatory Visit
Admission: RE | Admit: 2022-10-19 | Discharge: 2022-10-19 | Disposition: A | Discharge status: Home / Self Care | Payer: Medicare PPO | Source: Ambulatory Visit | Attending: Internal Medicine | Admitting: Internal Medicine

## 2022-10-19 DIAGNOSIS — R109 Unspecified abdominal pain: Secondary | ICD-10-CM | POA: Diagnosis not present

## 2022-10-19 DIAGNOSIS — K8020 Calculus of gallbladder without cholecystitis without obstruction: Secondary | ICD-10-CM | POA: Diagnosis not present

## 2022-10-19 DIAGNOSIS — R319 Hematuria, unspecified: Secondary | ICD-10-CM | POA: Diagnosis not present

## 2022-10-19 DIAGNOSIS — R1031 Right lower quadrant pain: Secondary | ICD-10-CM

## 2022-10-20 ENCOUNTER — Ambulatory Visit
Admission: EM | Admit: 2022-10-20 | Discharge: 2022-10-20 | Disposition: A | Discharge status: Home / Self Care | Payer: Medicare PPO | Attending: Emergency Medicine | Admitting: Emergency Medicine

## 2022-10-20 DIAGNOSIS — N3001 Acute cystitis with hematuria: Secondary | ICD-10-CM | POA: Diagnosis not present

## 2022-10-20 DIAGNOSIS — N390 Urinary tract infection, site not specified: Secondary | ICD-10-CM | POA: Insufficient documentation

## 2022-10-20 LAB — POCT URINALYSIS DIP (MANUAL ENTRY)
Glucose, UA: 100 mg/dL — AB
Nitrite, UA: POSITIVE — AB
Protein Ur, POC: >=300 mg/dL — AB
Spec Grav, UA: 1.01 (ref 1.010–1.025)
Urobilinogen, UA: 4 E.U./dL — AB
pH, UA: 5.5 (ref 5.0–8.0)

## 2022-10-20 MED ORDER — CEFTRIAXONE SODIUM 1 G IJ SOLR
1.0000 g | Freq: Once | INTRAMUSCULAR | Status: AC
  Administered 2022-10-20: 1 g via INTRAMUSCULAR

## 2022-10-20 MED ORDER — SULFAMETHOXAZOLE-TRIMETHOPRIM 800-160 MG PO TABS: 1.0000 | ORAL_TABLET | Freq: Two times a day (BID) | ORAL | 0 refills | Status: DC

## 2022-10-20 MED ORDER — SULFAMETHOXAZOLE-TRIMETHOPRIM 800-160 MG PO TABS: 1.0000 | ORAL_TABLET | Freq: Two times a day (BID) | ORAL | 0 refills | Status: AC

## 2022-10-20 NOTE — ED Provider Notes (Signed)
[facility]    CSN: [id number] Arrival date & time: 10/20/22  1532    HISTORY   Chief Complaint  Patient presents with   Urinary Frequency   HPI Danny [name] is a pleasant, 89 y.o. male who presents to urgent care today. Pt presents with urinary urgency, burning during urination, increased frequency of urination and uncontrollable dribbling for over 2 weeks; pt states he has been to his primary care and had CT abd done and has not got a report back yet.  Pt has known hx benign hemorrhagic cyst on left kidney.  Urine dip today revealed nitrites.  The history is provided by the patient.   Past Medical History:  Diagnosis Date   (HFpEF) heart failure with preserved ejection fraction    Echo 1/23: Inferior AK, mild aortic stenosis (mean 9 mmHg, V-max 210.5 cm/s, DI 0.70), EF 55-60, GR 1 DD, normal RVSF, normal PASP, trivial MR, mild AI, borderline dilation of aortic root (39 mm)   Acid reflux    hiatal hernia   CAD (coronary artery disease)    S/p non-STEMI 1/23 >> s/p CABG   Carotid artery disease 08/23/2021   Pre-CABG Dopplers 1/23:R ICA 40-59; L ICA 1-39   Chronic kidney disease (CKD)    Coronary artery disease involving native coronary artery of native heart without angina pectoris 05/16/2022   Inf NSTEMI s/p CABG in 07/2021 (RIMA-LAD, LIMA-OM1, S-OM2)   Diverticulitis    Erectile dysfunction 04/20/2015   Essential hypertension 04/20/2015   History of colon polyps    History of stroke    Noted on brain MRI 07/2021   Hyperlipidemia 05/28/2015   PAD (peripheral artery disease)    Pre-CABG Dopplers 1/23: Right ABI 0.65; left ABI 0.82   Patient Active Problem List   Diagnosis Date Noted   Coronary artery disease involving native coronary artery of native heart without angina pectoris 05/16/2022   Painless hematuria 01/19/2022   Onychomycosis 08/23/2021   (HFpEF) heart failure with preserved ejection fraction 08/23/2021   PAD (peripheral artery disease)  08/23/2021   Carotid artery disease 08/23/2021   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/2021   Aortic stenosis 07/30/2021   B12 deficiency 07/29/2021   Thrombocytopenia 07/28/2021   Macrocytic anemia 07/28/2021   Seizure-like activity 07/27/2021   Generalized abdominal pain 09/30/2020   Loss of weight 09/30/2020   CKD (chronic kidney disease) stage 3, GFR 30-59 ml/min 11/21/2019   Transient alteration of awareness 07/25/2018   Syncope 07/25/2018   Hyperlipidemia LDL goal <70 05/28/2015   Medicare annual wellness visit, subsequent 04/20/2015   Erectile dysfunction 04/20/2015   Essential hypertension 04/20/2015   Past Surgical History:  Procedure Laterality Date   APPENDECTOMY     cataract surgery     CORONARY ARTERY BYPASS GRAFT N/A 08/01/2021   Procedure: CORONARY ARTERY BYPASS GRAFTING (CABG) X 3  ,ON PUMP, USING LEFT AND RIGHT INTERNAL MAMMARY ARTERIES, RIGHT AND LEFT ENDOSCOPIC GREATER SAPHENOUS VEIN CONDUITS;  Surgeon: Alleen Borne, MD;  Location: [facility] OR;  Service: Open Heart Surgery;  Laterality: N/A;   ENDOVEIN HARVEST OF GREATER SAPHENOUS VEIN Right 08/01/2021   Procedure: ENDOVEIN HARVEST OF GREATER SAPHENOUS VEIN;  Surgeon: Alleen Borne, MD;  Location: [facility] OR;  Service: Open Heart Surgery;  Laterality: Right;   LEFT HEART CATH AND CORONARY ANGIOGRAPHY N/A 07/27/2021   Procedure: LEFT HEART CATH AND CORONARY ANGIOGRAPHY;  Surgeon: Iran Ouch, MD;  [facility]: [facility];  Service: Cardiovascular;  Laterality: N/A;  TEE WITHOUT CARDIOVERSION N/A 08/01/2021   Procedure: TRANSESOPHAGEAL ECHOCARDIOGRAM (TEE);  Surgeon: Alleen Borne, MD;  Location: [facility] OR;  Service: Open Heart Surgery;  Laterality: N/A;    Home Medications    Prior to Admission medications   Medication Sig Start Date End Date Taking? Authorizing Provider  sulfamethoxazole-trimethoprim (BACTRIM DS) 800-160 MG tablet Take 1 tablet by mouth 2 (two) times daily for 5 days. 10/20/22 10/25/22 Yes Theadora Rama Scales,  PA-C  aspirin EC 81 MG tablet Take 1 tablet (81 mg total) by mouth daily. Swallow whole. Patient not taking: Reported on 10/10/2022 09/14/21   Jake Bathe, MD  clopidogrel (PLAVIX) 75 MG tablet Take 1 tablet (75 mg total) by mouth daily. Patient not taking: Reported on 10/10/2022 09/14/21   Jake Bathe, MD  ezetimibe (ZETIA) 10 MG tablet Take 1 tablet (10 mg total) by mouth daily. 10/12/21   Nahser, Deloris Ping, MD  furosemide (LASIX) 20 MG tablet Take 1 tablet (20 mg total) by mouth daily as needed for fluid or edema. 05/16/22   Tereso Newcomer T, PA-C  hyoscyamine (LEVSIN SL) 0.125 MG SL tablet Take 1 tablet (0.125 mg total) by mouth every 8 (eight) hours as needed. Patient needs office visit. 06/26/22   Zehr, Princella Pellegrini, PA-C  metoprolol tartrate (LOPRESSOR) 25 MG tablet Take 1 tablet by mouth twice daily 11/17/21   Nahser, Deloris Ping, MD  nitroGLYCERIN (NITROSTAT) 0.4 MG SL tablet Place 1 tablet (0.4 mg total) under the tongue every 5 (five) minutes as needed for chest pain. 05/16/22 08/14/22  Tereso Newcomer T, PA-C  rosuvastatin (CRESTOR) 20 MG tablet Take 1 tablet (20 mg total) by mouth daily. 08/10/21   Rowe Clack, PA-C    Family History Family History  Problem Relation Age of Onset   Stroke Father 34   Sudden death Mother 32       unknown cause   Social History Social History   Tobacco Use   Smoking status: Former    Types: Cigars   Smokeless tobacco: Never  Building services engineer Use: Never used  Substance Use Topics   Alcohol use: No   Drug use: No   Allergies   Lipitor [atorvastatin]  Review of Systems Review of Systems Pertinent findings revealed after performing a 14 point review of systems has been noted in the history of present illness.  Physical Exam Vital Signs BP (!) 162/88 (BP Location: Left Arm)   Pulse 69   Temp 97.9 F (36.6 C) (Oral)   Resp 17   SpO2 91%   No data found.  Physical Exam Vitals and nursing note reviewed.  Constitutional:      General: He is not  in acute distress.    Appearance: Normal appearance. He is normal weight. He is not ill-appearing.  HENT:     Head: Normocephalic and atraumatic.  Eyes:     Extraocular Movements: Extraocular movements intact.     Conjunctiva/sclera: Conjunctivae normal.     Pupils: Pupils are equal, round, and reactive to light.  Cardiovascular:     Rate and Rhythm: Regular rhythm.  Pulmonary:     Effort: Pulmonary effort is normal.     Breath sounds: Normal breath sounds.  Abdominal:     General: Abdomen is flat. Bowel sounds are normal.     Palpations: Abdomen is soft.     Tenderness: There is abdominal tenderness in the suprapubic area.  Musculoskeletal:        General:  Normal range of motion.     Cervical back: Normal range of motion and neck supple.  Skin:    General: Skin is warm and dry.  Neurological:     General: No focal deficit present.     Mental Status: He is alert and oriented to person, place, and time. Mental status is at baseline.  Psychiatric:        Mood and Affect: Mood normal.        Behavior: Behavior normal.        Thought Content: Thought content normal.        Judgment: Judgment normal.     Visual Acuity Right Eye Distance:   Left Eye Distance:   Bilateral Distance:    Right Eye Near:   Left Eye Near:    Bilateral Near:     UC Couse / Diagnostics / Procedures:     Radiology No results found.  Procedures Procedures (including critical care time) EKG  Pending results:  Labs Reviewed  POCT URINALYSIS DIP (MANUAL ENTRY) - Abnormal; Notable for the following components:      Result Value   Color, UA red (*)    Glucose, UA =100 (*)    Bilirubin, UA large (*)    Ketones, POC UA small (15) (*)    Blood, UA large (*)    Protein Ur, POC >=300 (*)    Urobilinogen, UA 4.0 (*)    Nitrite, UA Positive (*)    Leukocytes, UA Large (3+) (*)    All other components within normal limits  URINE CULTURE    Medications Ordered in UC: Medications  cefTRIAXone  (ROCEPHIN) injection 1 g (1 g Intramuscular Given 10/20/22 1624)    UC Diagnoses / Final Clinical Impressions(s)   I have reviewed the triage vital signs and the nursing notes.  Pertinent labs & imaging results that were available during my care of the patient were reviewed by me and considered in my medical decision making (see chart for details).    Final diagnoses:  Acute cystitis with hematuria  Complicated urinary tract infection   Urine dip today was positive for nitrites.   Patient was provided with an injection of ceftriaxone and advised to begin antibiotics now due to findings on urine dip. Patient advised that they will be contacted with results and that adjustments to treatment will be provided as indicated based on the results.   Patient was advised of possibility that urine culture results may be negative if sample provided was obtained late in the day causing urine to be more diluted.  Patient was advised that if antibiotics were effective after the first 24 to 36 hours, despite negative urine culture result, it is recommended that they complete the full course as prescribed.   Return precautions advised.  Please see discharge instructions below for details of plan of care as provided to patient. ED Prescriptions     Medication Sig Dispense Auth. Provider   sulfamethoxazole-trimethoprim (BACTRIM DS) 800-160 MG tablet Take 1 tablet by mouth 2 (two) times daily for 10 days. 20 tablet Theadora Rama Scales, PA-C      PDMP not reviewed this encounter.  Disposition Upon Discharge:  Condition: stable for discharge home  Patient presented with concern for an acute illness with associated systemic symptoms and significant discomfort requiring urgent management. In my opinion, this is a condition that a prudent lay person (someone who possesses an average knowledge of health and medicine) may potentially expect to result in complications if  not addressed urgently such as  respiratory distress, impairment of bodily function or dysfunction of bodily organs.   As such, the patient has been evaluated and assessed, work-up was performed and treatment was provided in alignment with urgent care protocols and evidence based medicine.  Patient/parent/caregiver has been advised that the patient may require follow up for further testing and/or treatment if the symptoms continue in spite of treatment, as clinically indicated and appropriate.  Routine symptom specific, illness specific and/or disease specific instructions were discussed with the patient and/or caregiver at length.  Prevention strategies for avoiding STD exposure were also discussed.  The patient will follow up with their current PCP if and as advised. If the patient does not currently have a PCP we will assist them in obtaining one.   The patient may need specialty follow up if the symptoms continue, in spite of conservative treatment and management, for further workup, evaluation, consultation and treatment as clinically indicated and appropriate.  Patient/parent/caregiver verbalized understanding and agreement of plan as discussed.  All questions were addressed during visit.  Please see discharge instructions below for further details of plan.  Discharge Instructions:   Discharge Instructions      The urinalysis that we performed in the clinic today was abnormal.  Urine culture will be performed per our protocol.  The result of the urine culture will be available in the next 3 to 5 days and will be posted to your MyChart account.  If there is an abnormal finding, you will be contacted by phone and advised of further treatment recommendations, if any.   You were advised to begin antibiotics today because your urinalysis is abnormal and you are having active symptoms of an acute lower urinary tract infection also known as cystitis.  It is very important that you take all doses exactly as prescribed.   Incomplete antibiotic therapy can cause worsening urinary tract infection that can become aggressive, escape from urinary tract into your bloodstream causing sepsis which will require hospitalization.   You received an injection of a strong antibiotic called ceftriaxone during your today to help expedite resolution of your urinary tract infection.   Please pick up and begin taking your prescription for Bactrim DS (trimethoprim sulfamethoxazole) as soon as possible.  Please take all doses exactly as prescribed.  You can take this medication with or without food.  This medication is safe to take with your other medications.   If you receive a phone call advising you that your urine culture is negative but you begin to feel better after taking antibiotics for 24 hours, please feel free to complete the full course of antibiotics as they were likely needed and the urine culture result was false.  If your culture is negative and you do not feel any better, please return for repeat evaluation of other possible causes of your symptoms.   If you have not had complete resolution of your symptoms after completing treatment as prescribed, please return to urgent care for repeat evaluation or follow-up with your primary care provider.  Please also be sure to follow-up with your urologist as further evaluation is indicated based on the amount of blood that was present in your urine.   Thank you for visiting urgent care today.  I appreciate the opportunity to participate in your care.     This office note has been dictated using Teaching laboratory technician.  Unfortunately, this method of dictation can sometimes lead to typographical or grammatical errors.  I  apologize for your inconvenience in advance if this occurs.  Please do not hesitate to reach out to me if clarification is needed.       Theadora Rama Scales, [place] 10/21/22 [phone]

## 2022-10-20 NOTE — Discharge Instructions (Signed)
The urinalysis that we performed in the clinic today was abnormal.  Urine culture will be performed per our protocol.  The result of the urine culture will be available in the next 3 to 5 days and will be posted to your MyChart account.  If there is an abnormal finding, you will be contacted by phone and advised of further treatment recommendations, if any.   You were advised to begin antibiotics today because your urinalysis is abnormal and you are having active symptoms of an acute lower urinary tract infection also known as cystitis.  It is very important that you take all doses exactly as prescribed.  Incomplete antibiotic therapy can cause worsening urinary tract infection that can become aggressive, escape from urinary tract into your bloodstream causing sepsis which will require hospitalization.   You received an injection of a strong antibiotic called ceftriaxone during your today to help expedite resolution of your urinary tract infection.   Please pick up and begin taking your prescription for Bactrim DS (trimethoprim sulfamethoxazole) as soon as possible.  Please take all doses exactly as prescribed.  You can take this medication with or without food.  This medication is safe to take with your other medications.   If you receive a phone call advising you that your urine culture is negative but you begin to feel better after taking antibiotics for 24 hours, please feel free to complete the full course of antibiotics as they were likely needed and the urine culture result was false.  If your culture is negative and you do not feel any better, please return for repeat evaluation of other possible causes of your symptoms.   If you have not had complete resolution of your symptoms after completing treatment as prescribed, please return to urgent care for repeat evaluation or follow-up with your primary care provider.  Please also be sure to follow-up with your urologist as further evaluation is  indicated based on the amount of blood that was present in your urine.   Thank you for visiting urgent care today.  I appreciate the opportunity to participate in your care.

## 2022-10-20 NOTE — ED Triage Notes (Signed)
Pt presents with urinary urgency, burning during urination,  and discharge for over 2 weeks; pt states he has been to his primary care and had test done and has not got a report back yet.

## 2022-10-22 LAB — URINE CULTURE: Culture: >=100000 — AB

## 2022-10-23 ENCOUNTER — Telehealth: Payer: Self-pay

## 2022-10-23 ENCOUNTER — Other Ambulatory Visit: Payer: Self-pay | Admitting: Internal Medicine

## 2022-10-23 DIAGNOSIS — N2889 Other specified disorders of kidney and ureter: Secondary | ICD-10-CM

## 2022-10-23 NOTE — Telephone Encounter (Signed)
Noted  

## 2022-10-30 DIAGNOSIS — R319 Hematuria, unspecified: Secondary | ICD-10-CM | POA: Diagnosis not present

## 2022-10-30 DIAGNOSIS — R310 Gross hematuria: Secondary | ICD-10-CM | POA: Diagnosis not present

## 2022-11-13 ENCOUNTER — Encounter: Payer: Self-pay | Admitting: Cardiovascular Disease

## 2022-11-13 NOTE — Progress Notes (Unsigned)
Cardiology Office Note   Wants to continue to [name] upon my retirement   Date:  11/14/2022   ID:  LYFE FITZKE, DOB 11-29-1933, MRN [id number]  PCP:  Myrlene Broker, MD  Cardiologist:   Kristeen Miss, MD   Chief Complaint  Patient presents with   Hypertension        Aortic Stenosis   Hyperlipidemia   Problem List  1. HTN 2. Hyperlipidemia    Danny [name] is a 89 y.o. male who presents to re-establish care. I saw him many years ago . Has a hx of HTN and hyperlipidema.  Mows yards, does lots of yard work .   September 17, 2015:  BP has been running high. Does not eat salt. Hb has been falling - was 12.8  Sept. 19, 2017:  Staying active.  BP and HR are well controlled . Hx of HTN and hyperlipidemia .  Still anemic.    March 21, 2017: BP is elevated .  Has not had his HCTZ for the past month or so  Still eating salty foods.    March 19, 2018: Patient is doing much better.  He is taking all his medications.  He has been trying to avoid eating so much salt.  September 16, 2018: Danny [name] is seen again for follow up of his HTN and hyperlipidemia. Takes his meds regularly .  In Dec. 2019, he was sitting on the commode and had a presyncopal / syncope episode  The episode was associated with an episode of diarrhea .  Does not sound cardiac  Has been exercising regularly since that tmie  Labs from primary MD Chol = 156 HDL = 60 LDL = 82 Trigs = 82   September 16, 2019 Danny [name] is seen for follow up of his syncope / vasovagal syncope  No cp or dyspnea.  No recent syncope . Stays active.    Working out daily ,  Exercises. , is currently paitnting the living room .  VS looks  October 25, 2020  Danny [name] is seen for follow up of his syncope / vasovagle syncope . Stressed out currently . Wife fell and broke her hip.   Spent some time at [employer] , is not at home  Has not been measuring his BP at home   Has mild AS    October 12, 2021: Danny [name] is seen for  follow up of his syncope Has had CABG since last year  No cp,  Is walking regularly ,  is gradually increasing  1 mile or so now ,  plans on extending it ,   Has to care for his wife who is ill  His last LDL is 94. On rosuvastatin 20 mg a day ,  to be rechecked in a month   Nov 14, 2022 Danny [name] is seen for follow up of his CAD, syncope , HLD  Is losing weight Wt today is 127 lbs ( down 18 lbs from last year ,  28 lbs from 2 years ago )      Past Medical History:  Diagnosis Date   (HFpEF) heart failure with preserved ejection fraction (HCC)    Echo 1/23: Inferior AK, mild aortic stenosis (mean 9 mmHg, V-max 210.5 cm/s, DI 0.70), EF 55-60, GR 1 DD, normal RVSF, normal PASP, trivial MR, mild AI, borderline dilation of aortic root (39 mm)   Acid reflux    hiatal hernia   CAD (coronary artery disease)    S/p non-STEMI 1/23 >>  s/p CABG   Carotid artery disease (HCC) 08/23/2021   Pre-CABG Dopplers 1/23:R ICA 40-59; L ICA 1-39   Chronic kidney disease (CKD)    Coronary artery disease involving native coronary artery of native heart without angina pectoris 05/16/2022   Inf NSTEMI s/p CABG in 07/2021 (RIMA-LAD, LIMA-OM1, S-OM2)   Diverticulitis    Erectile dysfunction 04/20/2015   Essential hypertension 04/20/2015   History of colon polyps    History of stroke    Noted on brain MRI 07/2021   Hyperlipidemia 05/28/2015   PAD (peripheral artery disease) (HCC)    Pre-CABG Dopplers 1/23: Right ABI 0.65; left ABI 0.82    Past Surgical History:  Procedure Laterality Date   APPENDECTOMY     cataract surgery     CORONARY ARTERY BYPASS GRAFT N/A 08/01/2021   Procedure: CORONARY ARTERY BYPASS GRAFTING (CABG) X 3  ,ON PUMP, USING LEFT AND RIGHT INTERNAL MAMMARY ARTERIES, RIGHT AND LEFT ENDOSCOPIC GREATER SAPHENOUS VEIN CONDUITS;  Surgeon: Alleen Borne, MD;  Location: [facility] OR;  Service: Open Heart Surgery;  Laterality: N/A;   ENDOVEIN HARVEST OF GREATER SAPHENOUS VEIN Right 08/01/2021    Procedure: ENDOVEIN HARVEST OF GREATER SAPHENOUS VEIN;  Surgeon: Alleen Borne, MD;  Location: [facility] OR;  Service: Open Heart Surgery;  Laterality: Right;   LEFT HEART CATH AND CORONARY ANGIOGRAPHY N/A 07/27/2021   Procedure: LEFT HEART CATH AND CORONARY ANGIOGRAPHY;  Surgeon: Iran Ouch, MD;  [facility]: [facility];  Service: Cardiovascular;  Laterality: N/A;   TEE WITHOUT CARDIOVERSION N/A 08/01/2021   Procedure: TRANSESOPHAGEAL ECHOCARDIOGRAM (TEE);  Surgeon: Alleen Borne, MD;  Location: [facility] OR;  Service: Open Heart Surgery;  Laterality: N/A;     Current Outpatient Medications  Medication Sig Dispense Refill   aspirin EC 81 MG tablet Take 1 tablet (81 mg total) by mouth daily. Swallow whole. 90 tablet 3   ezetimibe (ZETIA) 10 MG tablet Take 1 tablet (10 mg total) by mouth daily. 90 tablet 3   metoprolol tartrate (LOPRESSOR) 25 MG tablet Take 1 tablet by mouth twice daily 180 tablet 3   rosuvastatin (CRESTOR) 20 MG tablet Take 1 tablet (20 mg total) by mouth daily. 30 tablet 1   clopidogrel (PLAVIX) 75 MG tablet Take 1 tablet (75 mg total) by mouth daily. (Patient not taking: Reported on 11/14/2022) 90 tablet 3   furosemide (LASIX) 20 MG tablet Take 1 tablet (20 mg total) by mouth daily as needed for fluid or edema. (Patient not taking: Reported on 11/14/2022) 90 tablet 1   hyoscyamine (LEVSIN SL) 0.125 MG SL tablet Take 1 tablet (0.125 mg total) by mouth every 8 (eight) hours as needed. Patient needs office visit. (Patient not taking: Reported on 11/14/2022) 30 tablet 0   nitroGLYCERIN (NITROSTAT) 0.4 MG SL tablet Place 1 tablet (0.4 mg total) under the tongue every 5 (five) minutes as needed for chest pain. 25 tablet 3   No current facility-administered medications for this visit.    Allergies:   Lipitor [atorvastatin]    Social History:  The patient  reports that he has quit smoking. His smoking use included cigars. He has never used smokeless tobacco. He reports that he does not  drink alcohol and does not use drugs.   Family History:  The patient's family history includes Stroke (age of onset: 60) in his father; Sudden death (age of onset: 28) in his mother.    ROS:  Please see the history of present illness.  Physical Exam: Blood pressure 124/66, pulse 68, height 5\' 4"  (1.626 m), weight 127 lb (57.6 kg), SpO2 96 %.       GEN:  thin, elderly male , well developed in no acute distress HEENT: Normal NECK: No JVD; No carotid bruits LYMPHATICS: No lymphadenopathy CARDIAC: RRR  2/6 systolic murmur  RESPIRATORY:  Clear to auscultation without rales, wheezing or rhonchi  ABDOMEN: Soft, non-tender, non-distended MUSCULOSKELETAL:  No edema; No deformity  SKIN: Warm and dry NEUROLOGIC:  Alert and oriented x 3     EKG:     Nov 14, 2022.   NSR at 68.   PACs , old septal MI, NS ST /T wave abn.    Recent Labs: 10/10/2022: ALT 49; BUN 25; Creatinine, Ser 1.20; Hemoglobin 9.7; Platelets 173.0; Potassium 5.2; Sodium 137    Lipid Panel    Component Value Date/Time   CHOL 192 10/10/2022 0843   CHOL 130 11/23/2021 1012   TRIG 54.0 10/10/2022 0843   HDL 66.80 10/10/2022 0843   HDL 62 11/23/2021 1012   CHOLHDL 3 10/10/2022 0843   VLDL 10.8 10/10/2022 0843   LDLCALC 114 (H) 10/10/2022 0843   LDLCALC 55 11/23/2021 1012      Wt Readings from Last 3 Encounters:  11/14/22 127 lb (57.6 kg)  10/10/22 132 lb 8 oz (60.1 kg)  05/16/22 129 lb 3.2 oz (58.6 kg)      Other studies Reviewed: Additional studies/ records that were reviewed today include: . Review of the above records demonstrates:    ASSESSMENT AND PLAN:  1.  CAD - s/p CABG-   no angina   2.  Essential Hypertension:        BP is well controlled.       3.   Hyperlipidemia:        LDL is 114.  On rosuvastatin and zetia  Cont for now   4.  Weight loss:   tries to eat.  Still losing weight . Will work through his primary md   Current medicines are reviewed at length with the patient today.   The patient does not have concerns regarding medicines.  The following changes have been made:  no change  Labs/ tests ordered today include:   Orders Placed This Encounter  Procedures   EKG 12-Lead   Disposition:       Kristeen Miss, MD  11/14/2022 6:03 PM    [facility] [address] [place], [place], [place]  [address] Phone: [phone]; Fax: [phone]

## 2022-11-14 ENCOUNTER — Ambulatory Visit: Payer: Medicare PPO | Attending: Cardiovascular Disease | Admitting: Cardiovascular Disease

## 2022-11-14 ENCOUNTER — Encounter: Payer: Self-pay | Admitting: Cardiovascular Disease

## 2022-11-14 VITALS — BP 124/66 | HR 68 | Ht 64.0 in | Wt 127.0 lb

## 2022-11-14 DIAGNOSIS — E785 Hyperlipidemia, unspecified: Secondary | ICD-10-CM

## 2022-11-14 DIAGNOSIS — I2510 Atherosclerotic heart disease of native coronary artery without angina pectoris: Secondary | ICD-10-CM | POA: Diagnosis not present

## 2022-11-14 NOTE — Patient Instructions (Signed)
Medication Instructions:  Your physician recommends that you continue on your current medications as directed. Please refer to the Current Medication list given to you today.  *If you need a refill on your cardiac medications before your next appointment, please call your pharmacy*   Lab Work: NONE If you have labs (blood work) drawn today and your tests are completely normal, you will receive your results only by: MyChart Message (if you have MyChart) OR A paper copy in the mail If you have any lab test that is abnormal or we need to change your treatment, we will call you to review the results.   Testing/Procedures: NONE   Follow-Up: At [facility], you and your health needs are our priority.  As part of our continuing mission to provide you with exceptional heart care, we have created designated Provider Care Teams.  These Care Teams include your primary Cardiologist (physician) and Advanced Practice Providers (APPs -  Physician Assistants and Nurse Practitioners) who all work together to provide you with the care you need, when you need it.  Your next appointment:   1 year(s)  Provider:   Philip Nahser, MD      

## 2022-11-29 DIAGNOSIS — K8020 Calculus of gallbladder without cholecystitis without obstruction: Secondary | ICD-10-CM | POA: Diagnosis not present

## 2022-11-29 DIAGNOSIS — R319 Hematuria, unspecified: Secondary | ICD-10-CM | POA: Diagnosis not present

## 2022-11-29 DIAGNOSIS — N281 Cyst of kidney, acquired: Secondary | ICD-10-CM | POA: Diagnosis not present

## 2022-11-29 DIAGNOSIS — R310 Gross hematuria: Secondary | ICD-10-CM | POA: Diagnosis not present

## 2022-12-06 ENCOUNTER — Other Ambulatory Visit: Payer: Self-pay | Admitting: Cardiovascular Disease

## 2022-12-06 ENCOUNTER — Other Ambulatory Visit: Payer: Self-pay | Admitting: Physician Assistant

## 2022-12-14 DIAGNOSIS — D414 Neoplasm of uncertain behavior of bladder: Secondary | ICD-10-CM | POA: Diagnosis not present

## 2022-12-18 ENCOUNTER — Other Ambulatory Visit: Payer: Self-pay | Admitting: Urology

## 2022-12-18 ENCOUNTER — Telehealth: Payer: Self-pay | Admitting: Cardiovascular Disease

## 2022-12-18 NOTE — Telephone Encounter (Signed)
   Pre-operative Risk Assessment    Patient Name: Danny [name]  DOB: 1933-12-16 MRN: [id number]      Request for Surgical Clearance    Procedure:   cysto turbt  Date of Surgery:  Clearance 01/03/23                                 Surgeon:  Dr. Liliane Shi Surgeon's Group or Practice Name:  [facility] Phone number:  [phone] Fax number:  [phone]   Type of Clearance Requested:   - Medical  - Pharmacy:  Hold Aspirin 5 days    Type of Anesthesia:  General    Additional requests/questions:      SignedFilomena Jungling   12/18/2022, 1:08 PM

## 2022-12-19 NOTE — Telephone Encounter (Signed)
   Patient Name: Danny [name]  DOB: Jul 04, 1934 MRN: [id number]  Primary Cardiologist: Kristeen Miss, MD  Chart reviewed as part of pre-operative protocol coverage. Pre-op clearance already addressed by colleagues in earlier phone notes. To summarize recommendations:  -Danny [name] may hold his ASA for 5 days prior to his surgery and restart as soon as is safe from a surgical standpoint. No further cardiac testing required for clearance.   Will route this bundled recommendation to requesting provider via Epic fax function and remove from pre-op pool. Please call with questions.  Sharlene Dory, PA-C 12/19/2022, 11:15 AM

## 2022-12-25 NOTE — Progress Notes (Signed)
Sent message, via epic in basket, requesting orders in epic from surgeon.  

## 2022-12-26 NOTE — Patient Instructions (Signed)
DUE TO COVID-19 ONLY TWO VISITORS  (aged 87 and older)  ARE ALLOWED TO COME WITH YOU AND STAY IN THE WAITING ROOM ONLY DURING PRE OP AND PROCEDURE.   **NO VISITORS ARE ALLOWED IN THE SHORT STAY AREA OR RECOVERY ROOM!!**  IF YOU WILL BE ADMITTED INTO THE HOSPITAL YOU ARE ALLOWED ONLY FOUR SUPPORT PEOPLE DURING VISITATION HOURS ONLY (7 AM -8PM)   The support person(s) must pass our screening, gel in and out, and wear a mask at all times, including in the patient's room. Patients must also wear a mask when staff or their support person are in the room. Visitors GUEST BADGE MUST BE WORN VISIBLY  One adult visitor may remain with you overnight and MUST be in the room by 8 P.M.     Your procedure is scheduled on: 01/03/23   Report to [facility] Main Entrance    Report to admitting at : 5:45 AM   Call this number if you have problems the morning of surgery [phone]   Do not eat food or drink  :After Midnight.  FOLLOW BOWEL PREP AND ANY ADDITIONAL PRE OP INSTRUCTIONS YOU RECEIVED FROM YOUR SURGEON'S OFFICE!!!   Oral Hygiene is also important to reduce your risk of infection.                                    Remember - BRUSH YOUR TEETH THE MORNING OF SURGERY WITH YOUR REGULAR TOOTHPASTE  DENTURES WILL BE REMOVED PRIOR TO SURGERY PLEASE DO NOT APPLY "Poly grip" OR ADHESIVES!!!   Do NOT smoke after Midnight   Take these medicines the morning of surgery with A SIP OF WATER: metoprolol.  Bring CPAP mask and tubing day of surgery.                              You may not have any metal on your body including hair pins, jewelry, and body piercing             Do not wear lotions, powders, perfumes/cologne, or deodorant              Men may shave face and neck.   Do not bring valuables to the hospital. [facility] IS NOT             RESPONSIBLE   FOR VALUABLES.   Contacts, glasses, or bridgework may not be worn into surgery.   Bring small overnight bag day of surgery.   DO  NOT BRING YOUR HOME MEDICATIONS TO THE HOSPITAL. PHARMACY WILL DISPENSE MEDICATIONS LISTED ON YOUR MEDICATION LIST TO YOU DURING YOUR ADMISSION IN THE HOSPITAL!    Patients discharged on the day of surgery will not be allowed to drive home.  Someone NEEDS to stay with you for the first 24 hours after anesthesia.   Special Instructions: Bring a copy of your healthcare power of attorney and living will documents         the day of surgery if you haven't scanned them before.              Please read over the following fact sheets you were given: IF YOU HAVE QUESTIONS ABOUT YOUR PRE-OP INSTRUCTIONS PLEASE CALL [phone]    [facility] - Preparing for Surgery Before surgery, you can play an important role.  Because skin is not sterile, your skin  needs to be as free of germs as possible.  You can reduce the number of germs on your skin by washing with CHG (chlorahexidine gluconate) soap before surgery.  CHG is an antiseptic cleaner which kills germs and bonds with the skin to continue killing germs even after washing. Please DO NOT use if you have an allergy to CHG or antibacterial soaps.  If your skin becomes reddened/irritated stop using the CHG and inform your nurse when you arrive at Short Stay. Do not shave (including legs and underarms) for at least 48 hours prior to the first CHG shower.  You may shave your face/neck. Please follow these instructions carefully:  1.  Shower with CHG Soap the night before surgery and the  morning of Surgery.  2.  If you choose to wash your hair, wash your hair first as usual with your  normal  shampoo.  3.  After you shampoo, rinse your hair and body thoroughly to remove the  shampoo.                           4.  Use CHG as you would any other liquid soap.  You can apply chg directly  to the skin and wash                       Gently with a scrungie or clean washcloth.  5.  Apply the CHG Soap to your body ONLY FROM THE NECK DOWN.   Do not use on face/ open                            Wound or open sores. Avoid contact with eyes, ears mouth and genitals (private parts).                       Wash face,  Genitals (private parts) with your normal soap.             6.  Wash thoroughly, paying special attention to the area where your surgery  will be performed.  7.  Thoroughly rinse your body with warm water from the neck down.  8.  DO NOT shower/wash with your normal soap after using and rinsing off  the CHG Soap.                9.  Pat yourself dry with a clean towel.            10.  Wear clean pajamas.            11.  Place clean sheets on your bed the night of your first shower and do not  sleep with pets. Day of Surgery : Do not apply any lotions/deodorants the morning of surgery.  Please wear clean clothes to the hospital/surgery center.  FAILURE TO FOLLOW THESE INSTRUCTIONS MAY RESULT IN THE CANCELLATION OF YOUR SURGERY PATIENT SIGNATURE_________________________________  NURSE SIGNATURE__________________________________  ________________________________________________________________________

## 2022-12-27 ENCOUNTER — Encounter (HOSPITAL_COMMUNITY)
Admission: RE | Admit: 2022-12-27 | Discharge: 2022-12-27 | Disposition: A | Discharge status: Home / Self Care | Payer: Medicare PPO | Source: Ambulatory Visit | Attending: Urology | Admitting: Urology

## 2022-12-27 ENCOUNTER — Encounter (HOSPITAL_COMMUNITY): Payer: Self-pay

## 2022-12-27 ENCOUNTER — Other Ambulatory Visit: Payer: Self-pay

## 2022-12-27 VITALS — BP 158/79 | HR 58 | Temp 97.7°F | Ht 64.0 in | Wt 127.0 lb

## 2022-12-27 DIAGNOSIS — N189 Chronic kidney disease, unspecified: Secondary | ICD-10-CM | POA: Insufficient documentation

## 2022-12-27 DIAGNOSIS — I739 Peripheral vascular disease, unspecified: Secondary | ICD-10-CM | POA: Insufficient documentation

## 2022-12-27 DIAGNOSIS — I130 Hypertensive heart and chronic kidney disease with heart failure and stage 1 through stage 4 chronic kidney disease, or unspecified chronic kidney disease: Secondary | ICD-10-CM | POA: Insufficient documentation

## 2022-12-27 DIAGNOSIS — Z8673 Personal history of transient ischemic attack (TIA), and cerebral infarction without residual deficits: Secondary | ICD-10-CM | POA: Diagnosis not present

## 2022-12-27 DIAGNOSIS — I5032 Chronic diastolic (congestive) heart failure: Secondary | ICD-10-CM | POA: Insufficient documentation

## 2022-12-27 DIAGNOSIS — I2510 Atherosclerotic heart disease of native coronary artery without angina pectoris: Secondary | ICD-10-CM | POA: Diagnosis not present

## 2022-12-27 DIAGNOSIS — I252 Old myocardial infarction: Secondary | ICD-10-CM | POA: Diagnosis not present

## 2022-12-27 DIAGNOSIS — D494 Neoplasm of unspecified behavior of bladder: Secondary | ICD-10-CM | POA: Insufficient documentation

## 2022-12-27 DIAGNOSIS — Z01818 Encounter for other preprocedural examination: Secondary | ICD-10-CM | POA: Diagnosis not present

## 2022-12-27 DIAGNOSIS — I10 Essential (primary) hypertension: Secondary | ICD-10-CM

## 2022-12-27 DIAGNOSIS — Z951 Presence of aortocoronary bypass graft: Secondary | ICD-10-CM | POA: Insufficient documentation

## 2022-12-27 HISTORY — DX: Cerebral infarction, unspecified: I63.9

## 2022-12-27 LAB — BASIC METABOLIC PANEL
Anion gap: 5 (ref 5–15)
BUN: 31 mg/dL — ABNORMAL HIGH (ref 8–23)
CO2: 26 mmol/L (ref 22–32)
Calcium: 9.2 mg/dL (ref 8.9–10.3)
Chloride: 105 mmol/L (ref 98–111)
Creatinine, Ser: 1.2 mg/dL (ref 0.61–1.24)
GFR, Estimated: 59 mL/min — ABNORMAL LOW (ref >60)
Glucose, Bld: 91 mg/dL (ref 70–99)
Potassium: 5.1 mmol/L (ref 3.5–5.1)
Sodium: 136 mmol/L (ref 135–145)

## 2022-12-27 LAB — CBC
HCT: 30.6 % — ABNORMAL LOW (ref 39.0–52.0)
Hemoglobin: 10.3 g/dL — ABNORMAL LOW (ref 13.0–17.0)
MCH: 34.6 pg — ABNORMAL HIGH (ref 26.0–34.0)
MCHC: 33.7 g/dL (ref 30.0–36.0)
MCV: 102.7 fL — ABNORMAL HIGH (ref 80.0–100.0)
Platelets: 136 10*3/uL — ABNORMAL LOW (ref 150–400)
RBC: 2.98 MIL/uL — ABNORMAL LOW (ref 4.22–5.81)
RDW: 14.2 % (ref 11.5–15.5)
WBC: 5.3 10*3/uL (ref 4.0–10.5)
nRBC: 0 % (ref 0.0–0.2)

## 2022-12-27 NOTE — Progress Notes (Addendum)
For Short Stay: COVID SWAB appointment date:  Bowel Prep reminder:   For Anesthesia: PCP - Dr. Hillard Danker. Cardiologist - Dr. Laqueta Carina. LOV: 11/14/22 Clearance: Jari Favre: PA: 12/19/22 Chest x-ray -  EKG - 11/14/22 Stress Test -  ECHO - 08/01/21 Cardiac Cath - 07/27/21 Pacemaker/ICD device last checked: Pacemaker orders received: Device Rep notified:  Spinal Cord Stimulator: N/A  Sleep Study - N/A CPAP -   Fasting Blood Sugar - N/A Checks Blood Sugar _____ times a day Date and result of last Hgb A1c-  Last dose of GLP1 agonist- N/A GLP1 instructions:   Last dose of SGLT-2 inhibitors- N/A SGLT-2 instructions:   Blood Thinner Instructions: Aspirin Instructions: Will be hold 5 day before surgery. Last Dose:  Activity level: Can go up a flight of stairs and activities of daily living without stopping and without chest pain and/or shortness of breath   Able to exercise without chest pain and/or shortness of breath  Anesthesia review: Hx: CAD,HTN,Heart failure,Stroke,PAD.  Patient denies shortness of breath, fever, cough and chest pain at PAT appointment   Patient verbalized understanding of instructions that were given to them at the PAT appointment. Patient was also instructed that they will need to review over the PAT instructions again at home before surgery.

## 2022-12-28 ENCOUNTER — Encounter (HOSPITAL_COMMUNITY): Payer: Self-pay

## 2022-12-28 ENCOUNTER — Other Ambulatory Visit: Payer: Self-pay | Admitting: Urology

## 2022-12-28 NOTE — Anesthesia Preprocedure Evaluation (Addendum)
Anesthesia Evaluation  Patient identified by MRN, date of birth, ID band Patient awake    Reviewed: Allergy & Precautions, H&P , NPO status , Patient's Chart, lab work & pertinent test results  Airway Mallampati: II  TM Distance: >3 FB Neck ROM: Limited    Dental no notable dental hx.    Pulmonary former smoker   Pulmonary exam normal breath sounds clear to auscultation       Cardiovascular hypertension, + CAD, + Past MI, + CABG and + Peripheral Vascular Disease  + Valvular Problems/Murmurs AS  Rhythm:Regular Rate:Normal + Systolic murmurs Left Ventricle: Left ventricular ejection fraction, by estimation, is 55  to 60%. The left ventricle has normal function. The left ventricle  demonstrates regional wall motion abnormalities. The left ventricular  internal cavity size was normal in size.  There is no left ventricular hypertrophy. Left ventricular diastolic  parameters are consistent with Grade I diastolic dysfunction (impaired  relaxation).   Right Ventricle: The right ventricular size is normal. Right ventricular  systolic function is normal. There is normal pulmonary artery systolic  pressure. The tricuspid regurgitant velocity is 1.89 m/s, and with an  assumed right atrial pressure of 15  mmHg, the estimated right ventricular systolic pressure is 29.3 mmHg.   Left Atrium: Left atrial size was normal in size.   Right Atrium: Right atrial size was normal in size.   Pericardium: There is no evidence of pericardial effusion.   Mitral Valve: The mitral valve is normal in structure. Mild mitral annular  calcification. Trivial mitral valve regurgitation. No evidence of mitral  valve stenosis.   Tricuspid Valve: The tricuspid valve is normal in structure. Tricuspid  valve regurgitation is mild . No evidence of tricuspid stenosis.   Aortic Valve: The aortic valve is calcified. Aortic valve regurgitation is  mild. Aortic  regurgitation PHT measures 697 msec. No aortic stenosis is  present. Aortic valve mean gradient measures 9.0 mmHg. Aortic valve peak  gradient measures 17.7 mmHg.  Aortic valve area, by VTI measures 2.20 cm.   Pulmonic Valve: The pulmonic valve was normal in structure. Pulmonic valve  regurgitation is not visualized. No evidence of pulmonic stenosis.   Aorta: Aortic dilatation noted. There is borderline dilatation of the  aortic root, measuring 39 mm.      Neuro/Psych CVA  negative psych ROS   GI/Hepatic Neg liver ROS,GERD  ,,  Endo/Other  negative endocrine ROS    Renal/GU Renal InsufficiencyRenal disease  negative genitourinary   Musculoskeletal negative musculoskeletal ROS (+)    Abdominal   Peds negative pediatric ROS (+)  Hematology negative hematology ROS (+)   Anesthesia Other Findings   Reproductive/Obstetrics negative OB ROS                             Anesthesia Physical Anesthesia Plan  ASA: 4  Anesthesia Plan: General   Post-op Pain Management: Minimal or no pain anticipated   Induction: Intravenous  PONV Risk Score and Plan: 2 and Ondansetron, Dexamethasone and Treatment may vary due to age or medical condition  Airway Management Planned: Oral ETT  Additional Equipment:   Intra-op Plan:   Post-operative Plan: Extubation in OR  Informed Consent: I have reviewed the patients History and Physical, chart, labs and discussed the procedure including the risks, benefits and alternatives for the proposed anesthesia with the patient or authorized representative who has indicated his/her understanding and acceptance.     Dental advisory given  Plan Discussed with: CRNA and Surgeon  Anesthesia Plan Comments: (See PAT note from 6/19 by Sherlie Ban PA-C )        Anesthesia Quick Evaluation

## 2022-12-28 NOTE — Progress Notes (Signed)
Case: [id number] Date/Time: 01/03/23 0745   Procedure: TRANSURETHRAL RESECTION OF BLADDER TUMOR (TURBT) - 90 MINUTES   Anesthesia type: General   Pre-op diagnosis: BLADDER TUMOR   Location: [facility] PROCEDURE ROOM / [facility]   Surgeons: Danny Paci, MD       DISCUSSION: Danny [name] is an 89 year old male who presents to PAT prior to surgery listed above.  Past medical history significant for former smoking, hypertension, CAD status post CABG (07/2021), NSTEMI (07/2021), PAD, carotid artery disease, CHF with EF 55-60%, mild aortic stenosis, chronic kidney disease, history of CVA, bladder mass.  No prior anesthesia complications  Patient last saw cardiology on 11/14/2022 for routine office visit.  Noted to have significant weight loss but otherwise doing well from a cardiac standpoint.  Blood pressure has been well-controlled although mildly elevated at PAT visit.  Patient denies chest pain or shortness of breath.  Cardiac clearance provided:  "[name] may hold his ASA for 5 days prior to his surgery and restart as soon as is safe from a surgical standpoint. No further cardiac testing required for clearance"  Anticipate patient can proceed barring any acute change.  VS: BP (!) 158/79   Pulse (!) 58   Temp 36.5 C (Oral)   Ht 5\' 4"  (1.626 m)   Wt 57.6 kg   SpO2 100%   BMI 21.80 kg/m   PROVIDERS: Danny Broker, MD Cardiologist: Danny Miss, MD  LABS: Labs reviewed: Acceptable for surgery. Anemia stable (all labs ordered are listed, but only abnormal results are displayed)  Labs Reviewed  BASIC METABOLIC PANEL - Abnormal; Notable for the following components:      Result Value   BUN 31 (*)    GFR, Estimated 59 (*)    All other components within normal limits  CBC - Abnormal; Notable for the following components:   RBC 2.98 (*)    Hemoglobin 10.3 (*)    HCT 30.6 (*)    MCV 102.7 (*)    MCH 34.6 (*)    Platelets 136 (*)    All other components within normal  limits     IMAGES:  CT A/P 10/19/22:  IMPRESSION: New 2.1 Cm bladder mass worrisome for neoplasm. Recommend further evaluation.   Hyperdense bilateral renal lesions again identified. Some of these were seen on the prior examination but are larger than study of 2022. Favor proteinaceous or hemorrhagic lesions but are indeterminate recommend additional workup with contrast examination to exclude abnormal enhancement.   Gallstones.   CXR 09/08/22:  IMPRESSION: No active cardiopulmonary disease.   Minimal residual left pleural thickening in the costophrenic angle. No radiographically apparent pleural effusion is seen.  EKG 11/14/22:  Sinus rhythm with PACs, rate 68 Septal infarct, age undetermined Inferior T wave inversions Appears similar to prior EKGs   CV:  Carotid US 07/19/22:  Summary:  Right Carotid: Velocities in the right ICA are consistent with a 40-59% stenosis. Non-hemodynamically significant plaque <50% noted in the CCA.   Left Carotid: Velocities in the left ICA are consistent with a 1-39% stenosis. Non-hemodynamically significant plaque <50% noted in the CCA.   Vertebrals: Bilateral vertebral arteries demonstrate antegrade flow.  Subclavians: Bilateral subclavian artery flow was disturbed.   Echo 07/28/2021:  IMPRESSIONS    1. Akinesis of the basal inferior wall with overall preserved LV  function; calcified aortic valve with very mild AS (mean gradient 9 mmHg).   2. Left ventricular ejection fraction, by estimation, is 55 to 60%. The  left  ventricle has normal function. The left ventricle demonstrates  regional wall motion abnormalities (see scoring diagram/findings for  description). Left ventricular diastolic  parameters are consistent with Grade I diastolic dysfunction (impaired  relaxation).   3. Right ventricular systolic function is normal. The right ventricular  size is normal. There is normal pulmonary artery systolic pressure.   4. The mitral  valve is normal in structure. Trivial mitral valve  regurgitation. No evidence of mitral stenosis.   5. The aortic valve is calcified. Aortic valve regurgitation is mild. No  aortic stenosis is present.   6. Aortic dilatation noted. There is borderline dilatation of the aortic  root, measuring 39 mm.   7. The inferior vena cava is normal in size with greater than 50%  respiratory variability, suggesting right atrial pressure of 3 mmHg.   Comparison(s): Prior images unable to be directly viewed, comparison made  by report only.   Left Heart Cath 07/27/2021:    Prox RCA lesion is 100% stenosed.   1st Mrg lesion is 90% stenosed.   Mid Cx to Dist Cx lesion is 90% stenosed.   1st Diag lesion is 95% stenosed.   2nd Diag-1 lesion is 80% stenosed.   2nd Diag-2 lesion is 85% stenosed.   Mid LAD lesion is 85% stenosed.   There is mild left ventricular systolic dysfunction.   LV end diastolic pressure is normal.   1.  Moderately calcified coronary arteries with severe three-vessel coronary artery disease.  The culprit for myocardial infarction is likely occluded proximal right coronary artery.  The onset of occlusion occlusion is likely more than 12 hours with no chest pain. 2.  Mildly reduced LV systolic function with an EF of 40 to 45% with normal left ventricular end-diastolic pressure.  There is evidence of basal to mid inferior wall hypokinesis.   Recommendations: Given diffuse three-vessel coronary artery disease, I think the best option for revascularization is CABG. Resume heparin in 4 hours at 7 PM.   Multivessel PCI to the LAD, OM1 and left circumflex is also possible if the patient is deemed to be too high risk for CABG.  Past Medical History:  Diagnosis Date   (HFpEF) heart failure with preserved ejection fraction (HCC)    Echo 1/23: Inferior AK, mild aortic stenosis (mean 9 mmHg, V-max 210.5 cm/s, DI 0.70), EF 55-60, GR 1 DD, normal RVSF, normal PASP, trivial MR, mild AI,  borderline dilation of aortic root (39 mm)   Acid reflux    hiatal hernia   CAD (coronary artery disease)    S/p non-STEMI 1/23 >> s/p CABG   Carotid artery disease (HCC) 08/23/2021   Pre-CABG Dopplers 1/23:R ICA 40-59; L ICA 1-39   Chronic kidney disease (CKD)    Coronary artery disease involving native coronary artery of native heart without angina pectoris 05/16/2022   Inf NSTEMI s/p CABG in 07/2021 (RIMA-LAD, LIMA-OM1, S-OM2)   Diverticulitis    Erectile dysfunction 04/20/2015   Essential hypertension 04/20/2015   History of colon polyps    History of stroke    Noted on brain MRI 07/2021   Hyperlipidemia 05/28/2015   PAD (peripheral artery disease) (HCC)    Pre-CABG Dopplers 1/23: Right ABI 0.65; left ABI 0.82   Stroke [facility])     Past Surgical History:  Procedure Laterality Date   APPENDECTOMY     cataract surgery     CORONARY ARTERY BYPASS GRAFT N/A 08/01/2021   Procedure: CORONARY ARTERY BYPASS GRAFTING (CABG) X 3  ,ON PUMP,  USING LEFT AND RIGHT INTERNAL MAMMARY ARTERIES, RIGHT AND LEFT ENDOSCOPIC GREATER SAPHENOUS VEIN CONDUITS;  Surgeon: Alleen Borne, MD;  Location: [facility] OR;  Service: Open Heart Surgery;  Laterality: N/A;   ENDOVEIN HARVEST OF GREATER SAPHENOUS VEIN Right 08/01/2021   Procedure: ENDOVEIN HARVEST OF GREATER SAPHENOUS VEIN;  Surgeon: Alleen Borne, MD;  Location: [facility] OR;  Service: Open Heart Surgery;  Laterality: Right;   LEFT HEART CATH AND CORONARY ANGIOGRAPHY N/A 07/27/2021   Procedure: LEFT HEART CATH AND CORONARY ANGIOGRAPHY;  Surgeon: Iran Ouch, MD;  Location: [facility] INVASIVE CV LAB;  Service: Cardiovascular;  Laterality: N/A;   TEE WITHOUT CARDIOVERSION N/A 08/01/2021   Procedure: TRANSESOPHAGEAL ECHOCARDIOGRAM (TEE);  Surgeon: Alleen Borne, MD;  Location: [facility] OR;  Service: Open Heart Surgery;  Laterality: N/A;   TRANSURETHRAL RESECTION OF PROSTATE      MEDICATIONS:  aspirin EC 81 MG tablet   clopidogrel (PLAVIX) 75 MG tablet   ezetimibe  (ZETIA) 10 MG tablet   furosemide (LASIX) 20 MG tablet   hyoscyamine (LEVSIN SL) 0.125 MG SL tablet   metoprolol tartrate (LOPRESSOR) 25 MG tablet   nitroGLYCERIN (NITROSTAT) 0.4 MG SL tablet   rosuvastatin (CRESTOR) 20 MG tablet   No current facility-administered medications for this encounter.   Marcille Blanco [facility] Short Stay/Anesthesiology [facility] Phone [phone] 12/28/2022 2:05 PM

## 2023-01-01 ENCOUNTER — Telehealth: Payer: Self-pay

## 2023-01-01 NOTE — Telephone Encounter (Signed)
Patient's wife stated that husband fell today and hit his head, some bleeding, no fainting, no dizziness. Patient can not understand what happened after the fall.  No headache, no visual disturbances, no n/v. At this moment patient is sitting up in his recliner watching tv. Please call to check on patient wife very concerned.

## 2023-01-02 NOTE — Telephone Encounter (Signed)
1st attempt unable to LVM

## 2023-01-02 NOTE — Telephone Encounter (Signed)
Pt has been schedule.

## 2023-01-02 NOTE — Telephone Encounter (Signed)
Yes okay for visit with any provider

## 2023-01-02 NOTE — Telephone Encounter (Signed)
Schedule Pt with any provider?

## 2023-01-03 ENCOUNTER — Ambulatory Visit (HOSPITAL_COMMUNITY)
Admission: RE | Admit: 2023-01-03 | Discharge: 2023-01-03 | Disposition: A | Discharge status: Home / Self Care | Payer: Medicare PPO | Attending: Urology | Admitting: Urology

## 2023-01-03 ENCOUNTER — Ambulatory Visit (HOSPITAL_COMMUNITY): Payer: Medicare PPO | Admitting: Medical

## 2023-01-03 ENCOUNTER — Encounter (HOSPITAL_COMMUNITY): Payer: Self-pay | Admitting: Urology

## 2023-01-03 ENCOUNTER — Ambulatory Visit (HOSPITAL_COMMUNITY): Payer: Medicare PPO

## 2023-01-03 ENCOUNTER — Encounter (HOSPITAL_COMMUNITY)
Admission: RE | Disposition: A | Discharge status: Home / Self Care | Payer: Self-pay | Source: Home / Self Care | Attending: Urology

## 2023-01-03 ENCOUNTER — Ambulatory Visit (HOSPITAL_BASED_OUTPATIENT_CLINIC_OR_DEPARTMENT_OTHER): Payer: Medicare PPO | Admitting: Certified Registered Nurse Anesthetist

## 2023-01-03 DIAGNOSIS — D414 Neoplasm of uncertain behavior of bladder: Secondary | ICD-10-CM | POA: Diagnosis not present

## 2023-01-03 DIAGNOSIS — I130 Hypertensive heart and chronic kidney disease with heart failure and stage 1 through stage 4 chronic kidney disease, or unspecified chronic kidney disease: Secondary | ICD-10-CM | POA: Diagnosis not present

## 2023-01-03 DIAGNOSIS — Z87891 Personal history of nicotine dependence: Secondary | ICD-10-CM | POA: Diagnosis not present

## 2023-01-03 DIAGNOSIS — D494 Neoplasm of unspecified behavior of bladder: Secondary | ICD-10-CM | POA: Diagnosis present

## 2023-01-03 DIAGNOSIS — I252 Old myocardial infarction: Secondary | ICD-10-CM

## 2023-01-03 DIAGNOSIS — C674 Malignant neoplasm of posterior wall of bladder: Secondary | ICD-10-CM

## 2023-01-03 DIAGNOSIS — I10 Essential (primary) hypertension: Secondary | ICD-10-CM | POA: Diagnosis not present

## 2023-01-03 DIAGNOSIS — D090 Carcinoma in situ of bladder: Secondary | ICD-10-CM | POA: Diagnosis not present

## 2023-01-03 DIAGNOSIS — R319 Hematuria, unspecified: Secondary | ICD-10-CM | POA: Insufficient documentation

## 2023-01-03 DIAGNOSIS — I2510 Atherosclerotic heart disease of native coronary artery without angina pectoris: Secondary | ICD-10-CM | POA: Diagnosis not present

## 2023-01-03 DIAGNOSIS — D303 Benign neoplasm of bladder: Secondary | ICD-10-CM | POA: Diagnosis not present

## 2023-01-03 DIAGNOSIS — I5030 Unspecified diastolic (congestive) heart failure: Secondary | ICD-10-CM | POA: Diagnosis not present

## 2023-01-03 DIAGNOSIS — F17290 Nicotine dependence, other tobacco product, uncomplicated: Secondary | ICD-10-CM | POA: Diagnosis not present

## 2023-01-03 DIAGNOSIS — Z951 Presence of aortocoronary bypass graft: Secondary | ICD-10-CM | POA: Insufficient documentation

## 2023-01-03 DIAGNOSIS — N183 Chronic kidney disease, stage 3 unspecified: Secondary | ICD-10-CM | POA: Diagnosis not present

## 2023-01-03 DIAGNOSIS — N400 Enlarged prostate without lower urinary tract symptoms: Secondary | ICD-10-CM | POA: Insufficient documentation

## 2023-01-03 HISTORY — PX: TRANSURETHRAL RESECTION OF BLADDER TUMOR: SHX2575

## 2023-01-03 SURGERY — TURBT (TRANSURETHRAL RESECTION OF BLADDER TUMOR)
Anesthesia: General

## 2023-01-03 MED ORDER — SUGAMMADEX SODIUM 200 MG/2ML IV SOLN
INTRAVENOUS | Status: DC | PRN
  Administered 2023-01-03: 240 mg via INTRAVENOUS

## 2023-01-03 MED ORDER — ROCURONIUM BROMIDE 10 MG/ML (PF) SYRINGE
PREFILLED_SYRINGE | INTRAVENOUS | Status: DC | PRN
  Administered 2023-01-03: 50 mg via INTRAVENOUS
  Administered 2023-01-03: 20 mg via INTRAVENOUS

## 2023-01-03 MED ORDER — CEFAZOLIN SODIUM-DEXTROSE 2-4 GM/100ML-% IV SOLN
2.0000 g | INTRAVENOUS | Status: AC
  Administered 2023-01-03: 2 g via INTRAVENOUS
  Filled 2023-01-03: qty 100

## 2023-01-03 MED ORDER — EPHEDRINE 5 MG/ML INJ
INTRAVENOUS | Status: AC
  Filled 2023-01-03: qty 5

## 2023-01-03 MED ORDER — PHENYLEPHRINE HCL-NACL 20-0.9 MG/250ML-% IV SOLN
INTRAVENOUS | Status: DC | PRN
  Administered 2023-01-03: 35 ug/min via INTRAVENOUS

## 2023-01-03 MED ORDER — ONDANSETRON HCL 4 MG/2ML IJ SOLN: 4.0000 mg | Freq: Once | INTRAMUSCULAR | Status: DC | PRN

## 2023-01-03 MED ORDER — OXYCODONE HCL 5 MG PO TABS: 5.0000 mg | ORAL_TABLET | Freq: Once | ORAL | Status: DC | PRN

## 2023-01-03 MED ORDER — ROCURONIUM BROMIDE 10 MG/ML (PF) SYRINGE
PREFILLED_SYRINGE | INTRAVENOUS | Status: AC
  Filled 2023-01-03: qty 10

## 2023-01-03 MED ORDER — PROPOFOL 10 MG/ML IV BOLUS
INTRAVENOUS | Status: AC
  Filled 2023-01-03: qty 20

## 2023-01-03 MED ORDER — ONDANSETRON HCL 4 MG/2ML IJ SOLN
INTRAMUSCULAR | Status: AC
  Filled 2023-01-03: qty 2

## 2023-01-03 MED ORDER — LACTATED RINGERS IV SOLN: INTRAVENOUS | Status: DC

## 2023-01-03 MED ORDER — LIDOCAINE 2% (20 MG/ML) 5 ML SYRINGE
INTRAMUSCULAR | Status: DC | PRN
  Administered 2023-01-03: 60 mg via INTRAVENOUS

## 2023-01-03 MED ORDER — PROPOFOL 10 MG/ML IV BOLUS
INTRAVENOUS | Status: DC | PRN
  Administered 2023-01-03: 80 mg via INTRAVENOUS

## 2023-01-03 MED ORDER — FENTANYL CITRATE PF 50 MCG/ML IJ SOSY: 25.0000 ug | PREFILLED_SYRINGE | INTRAMUSCULAR | Status: DC | PRN

## 2023-01-03 MED ORDER — PHENAZOPYRIDINE HCL 200 MG PO TABS: 200.0000 mg | ORAL_TABLET | Freq: Three times a day (TID) | ORAL | 0 refills | Status: DC | PRN

## 2023-01-03 MED ORDER — SODIUM CHLORIDE 0.9 % IR SOLN
Status: DC | PRN
  Administered 2023-01-03: 15000 mL via INTRAVESICAL

## 2023-01-03 MED ORDER — LIDOCAINE HCL (PF) 2 % IJ SOLN
INTRAMUSCULAR | Status: AC
  Filled 2023-01-03: qty 5

## 2023-01-03 MED ORDER — ORAL CARE MOUTH RINSE: 15.0000 mL | Freq: Once | OROMUCOSAL | Status: AC

## 2023-01-03 MED ORDER — EPHEDRINE SULFATE-NACL 50-0.9 MG/10ML-% IV SOSY
PREFILLED_SYRINGE | INTRAVENOUS | Status: DC | PRN
  Administered 2023-01-03: 5 mg via INTRAVENOUS
  Administered 2023-01-03: 10 mg via INTRAVENOUS

## 2023-01-03 MED ORDER — ONDANSETRON HCL 4 MG/2ML IJ SOLN
INTRAMUSCULAR | Status: DC | PRN
  Administered 2023-01-03: 4 mg via INTRAVENOUS

## 2023-01-03 MED ORDER — FENTANYL CITRATE (PF) 100 MCG/2ML IJ SOLN
INTRAMUSCULAR | Status: AC
  Filled 2023-01-03: qty 2

## 2023-01-03 MED ORDER — 0.9 % SODIUM CHLORIDE (POUR BTL) OPTIME
TOPICAL | Status: DC | PRN
  Administered 2023-01-03: 1000 mL

## 2023-01-03 MED ORDER — IOHEXOL 300 MG/ML  SOLN
INTRAMUSCULAR | Status: DC | PRN
  Administered 2023-01-03: 30 mL

## 2023-01-03 MED ORDER — FENTANYL CITRATE (PF) 100 MCG/2ML IJ SOLN
INTRAMUSCULAR | Status: DC | PRN
  Administered 2023-01-03: 50 ug via INTRAVENOUS

## 2023-01-03 MED ORDER — CHLORHEXIDINE GLUCONATE 0.12 % MT SOLN
15.0000 mL | Freq: Once | OROMUCOSAL | Status: AC
  Administered 2023-01-03: 15 mL via OROMUCOSAL

## 2023-01-03 MED ORDER — STERILE WATER FOR IRRIGATION IR SOLN
Status: DC | PRN
  Administered 2023-01-03: 500 mL

## 2023-01-03 MED ORDER — CEPHALEXIN 500 MG PO CAPS: 500.0000 mg | ORAL_CAPSULE | Freq: Two times a day (BID) | ORAL | 0 refills | Status: AC

## 2023-01-03 MED ORDER — OXYCODONE HCL 5 MG/5ML PO SOLN: 5.0000 mg | Freq: Once | ORAL | Status: DC | PRN

## 2023-01-03 SURGICAL SUPPLY — 18 items
BAG DRN RND TRDRP ANRFLXCHMBR (UROLOGICAL SUPPLIES)
BAG URINE DRAIN 2000ML AR STRL (UROLOGICAL SUPPLIES) IMPLANT
BAG URO CATCHER STRL LF (MISCELLANEOUS) ×1 IMPLANT
CATH FOLEY 2WAY SLVR 5CC 18FR (CATHETERS) IMPLANT
DRAPE FOOT SWITCH (DRAPES) ×2 IMPLANT
ELECT REM PT RETURN 15FT ADLT (MISCELLANEOUS) ×1 IMPLANT
GLOVE SURG LX STRL 7.5 STRW (GLOVE) ×1 IMPLANT
GOWN STRL REUS W/ TWL XL LVL3 (GOWN DISPOSABLE) ×1 IMPLANT
GOWN STRL REUS W/TWL XL LVL3 (GOWN DISPOSABLE) ×1
HOLDER FOLEY CATH W/STRAP (MISCELLANEOUS) IMPLANT
KIT TURNOVER KIT A (KITS) IMPLANT
LOOP CUT BIPOLAR 24F LRG (ELECTROSURGICAL) IMPLANT
MANIFOLD NEPTUNE II (INSTRUMENTS) ×1 IMPLANT
PACK CYSTO (CUSTOM PROCEDURE TRAY) ×1 IMPLANT
SYR TOOMEY IRRIG 70ML (MISCELLANEOUS) ×1
SYRINGE TOOMEY IRRIG 70ML (MISCELLANEOUS) IMPLANT
TUBING CONNECTING 10 (TUBING) ×2 IMPLANT
TUBING UROLOGY SET (TUBING) ×1 IMPLANT

## 2023-01-03 NOTE — H&P (Signed)
Office Visit Report     12/14/2022   --------------------------------------------------------------------------------   Danny [name]  MRN: [id number]  DOB: [birth year], 89 year old Male   PRIMARY CARE:     REFERRING:  Brandyn Thien A. Liliane Shi, MD  PROVIDER:  Rhoderick Moody, M.D.  LOCATION:  [facility] [phone]     --------------------------------------------------------------------------------   CC/HPI: CC: Hematuria   HPI: Danny [name] is a 89 year old male referred by Dr. Okey Dupre for a hematuria evaluation. Hx of BPH, s/p TURP in 2009 and CABG in 2023.   -History of painless gross hematuria for several weeks. Found to have a UTI at an urgent care and was treated with a course of IM and PO abx (pt can't remember medications)  -Smoking history: Casually smokes cigars  -History of nephrolithiasis: Remote history of non-obstructing renal stone--no recent imaging--no prior surgeries  -History of CKD: denies  -Anti-coagulant/anti-platelet use: taking 81 mg asa following triple bypass in 2023  -Hematologic disorders: denies  -History of UTI: as above  -History of GU surgery/trauma: TURP in 2009  -Personal/family history of GU malignancies: denies  -From a urinary standpoint, he reports a good FOS and feels like he is emptying his bladder well. No LUTS or gross hematuria since completing is abx last week.  -Cardiologist: Kristeen Miss, MD   12/14/22: The patient is here today for a cystoscopy to complete his hematuria evaluation. He was noted to have a an enhancing bladder mass on recent CT urogram with no other GU abnormalities or signs of metastatic disease. He continues to experience intermittent episodes of gross hematuria but denies any dysuria or interval UTIs.     ALLERGIES: Lipitor    MEDICATIONS: Bactrim Ds 800 mg-160 mg tablet  Metoprolol Tartrate 25 mg tablet  Aspirin Ec 81 mg tablet, delayed release  Furosemide 20 mg tablet  Hyoscyamine Sulfate 0.125  mg tablet  Nitroglycerin 0.4 mg tablet, sublingual  Rosuvastatin Calcium 20 mg tablet  Zetia 10 mg tablet     GU PSH: Cystoscopy TURP - 2009 Locm 300-399Mg /Ml Iodine,1Ml - 11/29/2022       PSH Notes: Eye Surgery, Appendectomy, Transurethral Resection Of Prostate, Transurethral Resection Of Prostate (TURP)   NON-GU PSH: Appendectomy - 2009 CABG (coronary artery bypass grafting), x3     GU PMH: Gross hematuria - 11/29/2022, - 10/30/2022 History of urolithiasis - 10/30/2022, Nephrolithiasis, - 2014 ED due to arterial insufficiency, Erectile dysfunction due to arterial insufficiency - 2014 Encounter for Prostate Cancer screening, Prostate cancer screening - 2014 Renal cyst, Renal cyst, acquired, left - 2014, Renal cyst, acquired, right, - 2014      PMH Notes: Stomach ulcers   1898-07-10 00:00:00 - Note: Normal Routine History And Physical Senior Citizen [phone])  2007-11-07 14:04:22 - Note: Heartburn With Regurgitation   NON-GU PMH: Age-related osteoporosis without current pathological fracture, Osteoporosis - 2014 Anxiety, Anxiety - 2014 Gastric ulcer, unspecified as acute or chronic, without hemorrhage or perforation, Gastric Ulcer - 2014 Personal history of other diseases of the circulatory system, History of hypertension - 2014 Personal history of other endocrine, nutritional and metabolic disease, History of hypercholesterolemia - 2014 Aortic stenosis Atherosclerotic heart disease of native coronary artery without angina pectoris Deficiency of other specified B group vitamins Disorder of arteries and arterioles, unspecified GERD Hypercholesterolemia Hyperlipidemia, unspecified Hypertension Myocardial Infarction Nutritional anemia, unspecified Peripheral vascular disease Presence of aortocoronary bypass graft Thrombocytopenia, unspecified    FAMILY HISTORY: Cholelithiasis - Brother Family Health Status Number - Runs In Family Father Deceased  At Age86 ___ - Runs In  Family Heart Disease - Father Mother Deceased At Age 53 from diabetic complicati - Runs In Family nephrolithiasis - Father, Brother   SOCIAL HISTORY: Marital Status: Married Preferred Language: English; Ethnicity: Not Hispanic Or Latino; Race: White Current Smoking Status: Unknown if patient smokes.   Tobacco Use Assessment Completed: Used Tobacco in last 30 days? Has never drank.  Drinks 2 caffeinated drinks per day.     Notes: Caffeine Use, Marital History - Currently Married, Previous History Of Smoking, Tobacco Use, Alcohol Use, Occupation:   REVIEW OF SYSTEMS:    GU Review Male:   Patient denies frequent urination, hard to postpone urination, burning/ pain with urination, get up at night to urinate, leakage of urine, stream starts and stops, trouble starting your stream, have to strain to urinate , erection problems, and penile pain.  Gastrointestinal (Upper):   Patient denies nausea, vomiting, and indigestion/ heartburn.  Gastrointestinal (Lower):   Patient denies diarrhea and constipation.  Constitutional:   Patient denies fever, night sweats, weight loss, and fatigue.  Skin:   Patient denies skin rash/ lesion and itching.  Eyes:   Patient denies blurred vision and double vision.  Ears/ Nose/ Throat:   Patient denies sore throat and sinus problems.  Hematologic/Lymphatic:   Patient denies swollen glands and easy bruising.  Cardiovascular:   Patient denies leg swelling and chest pains.  Respiratory:   Patient denies cough and shortness of breath.  Endocrine:   Patient denies excessive thirst.  Musculoskeletal:   Patient denies back pain and joint pain.  Neurological:   Patient denies headaches and dizziness.  Psychologic:   Patient denies depression and anxiety.   VITAL SIGNS: None   Complexity of Data:   11/08/07 10/20/05 01/25/05  PSA  Total PSA 0.61  0.82  0.75     PROCEDURES:         Flexible Cystoscopy - 52000  Risks, benefits, and some of the potential  complications of the procedure were discussed at length with the patient including infection, bleeding, voiding discomfort, urinary retention, fever, chills, sepsis, and others. All questions were answered. Informed consent was obtained. Antibiotic prophylaxis was given. Sterile technique and intraurethral analgesia were used.  Meatus:  Normal size. Normal location. Normal condition.  Urethra:  No strictures.  External Sphincter:  Normal.  Verumontanum:  Normal.  Prostate:  Non-obstructing. No hyperplasia.  Bladder Neck:  Non-obstructing.  Ureteral Orifices:  Normal location. Normal size. Normal shape. Effluxed clear urine.  Bladder:  A dome tumor. 3+ cm tumor. No trabeculation. Normal mucosa. No stones.      The lower urinary tract was carefully examined. The procedure was well-tolerated and without complications. Antibiotic instructions were given. Instructions were given to call the office immediately for bloody urine, difficulty urinating, urinary retention, painful or frequent urination, fever, chills, nausea, vomiting or other illness. The patient stated that he understood these instructions and would comply with them.         Urinalysis w/Scope Dipstick Dipstick Cont'd Micro  Color: Red Bilirubin: Neg mg/dL WBC/hpf: 6 - 82/NFA  Appearance: Cloudy Ketones: Neg mg/dL RBC/hpf: >21/HYQ  Specific Gravity: 1.020 Blood: 3+ ery/uL Bacteria: Mod (26-50/hpf)  pH: 6.5 Protein: 3+ mg/dL Cystals: NS (Not Seen)  Glucose: Neg mg/dL Urobilinogen: 0.2 mg/dL Casts: NS (Not Seen)    Nitrites: Neg Trichomonas: Not Present    Leukocyte Esterase: Trace leu/uL Mucous: Not Present      Epithelial Cells: NS (Not Seen)  Yeast: NS (Not Seen)      Sperm: Not Present    ASSESSMENT:      ICD-10 Details  1 GU:   Bladder tumor/neoplasm - D41.4 Undiagnosed New Problem     PLAN:           Orders Labs Urine Culture          Schedule Return Visit/Planned Activity: Next Available Appointment - Schedule  Surgery          Document Letter(s):  Created for Patient: Clinical Summary         Notes:   -5 cm bladder mass with features concerning for urothelial carcinoma seen during cystoscopy today  - The risks, benefits and alternatives of cystoscopy with TURBT was discussed with the patient. The risks include, but are not limited to, bleeding, urinary tract infection, bladder perforation requiring prolonged catheterization and/or open bladder repair, ureteral obstruction, voiding dysfunction and the inherent risks of general anesthesia. The patient voices understanding and wishes to proceed.

## 2023-01-03 NOTE — Anesthesia Procedure Notes (Signed)
Procedure Name: Intubation Date/Time: 01/03/2023 8:13 AM  Performed by: Orest Dikes, CRNAPre-anesthesia Checklist: Patient identified, Emergency Drugs available, Suction available and Patient being monitored Patient Re-evaluated:Patient Re-evaluated prior to induction Oxygen Delivery Method: Circle system utilized Preoxygenation: Pre-oxygenation with 100% oxygen Induction Type: IV induction Ventilation: Mask ventilation without difficulty Laryngoscope Size: Mac and 4 Grade View: Grade I Tube type: Oral Tube size: 7.5 mm Number of attempts: 1 Airway Equipment and Method: Stylet Placement Confirmation: ETT inserted through vocal cords under direct vision, positive ETCO2 and breath sounds checked- equal and bilateral Secured at: 21 cm Tube secured with: Tape Dental Injury: Teeth and Oropharynx as per pre-operative assessment

## 2023-01-03 NOTE — Progress Notes (Signed)
Patient remains unable to void post operatively. Dr. Liliane Shi notified, orders received.

## 2023-01-03 NOTE — Transfer of Care (Signed)
Immediate Anesthesia Transfer of Care Note  Patient: Danny [name]  Procedure(s) Performed: TRANSURETHRAL RESECTION OF BLADDER TUMOR (TURBT)  Patient Location: PACU  Anesthesia Type:General  Level of Consciousness: awake and alert   Airway & Oxygen Therapy: Patient Spontanous Breathing and Patient connected to face mask oxygen  Post-op Assessment: Report given to RN and Post -op Vital signs reviewed and stable  Post vital signs: Reviewed and stable  Last Vitals:  Vitals Value Taken Time  BP    Temp    Pulse 31 01/03/23 0915  Resp 17 01/03/23 0915  SpO2 100 % 01/03/23 0915  Vitals shown include unvalidated device data.  Last Pain:  Vitals:   01/03/23 0653  TempSrc: Oral         Complications: No notable events documented.

## 2023-01-03 NOTE — Op Note (Signed)
Operative Note  Preoperative diagnosis:  1.  5 cm papillary bladder tumor right posterior bladder wall extending up the bladder dome  Postoperative diagnosis: 1.  Same  Procedure(s): 1.  Cystoscopy with TURBT (large) 2.  Cystogram  Surgeon: Rhoderick Moody, MD  Assistants:  None  Anesthesia:  General  Complications:  None  EBL: 50  Specimens: 1.  Superficial and deep margins of posterior bladder wall tumor  Drains/Catheters: 1.  None  Intraoperative findings:   5 cm right posterior bladder wall tumor extending up to the bladder dome with necrotic features Intraoperative cystogram revealed no evidence of intra or extra peritoneal bladder perforation  Indication:  Danny [name] is a 89 y.o. male with a 5 cm papillary bladder tumor involving the right posterior bladder wall with features concerning for urothelial cell carcinoma.  He has been consented for the above procedures, voices understanding and wishes to proceed.  Description of procedure:  After informed consent was obtained, the patient was brought to the operating room and endotracheal anesthesia was administered.  The patient was placed in the dorsolithotomy position and prepped and draped in usual sterile fashion.  A timeout was then performed.  A 26 French resectoscope was then inserted into the bladder with the visual obturator.  A complete bladder survey revealed a large papillary bladder tumor involving the right posterior bladder wall and extending up to the bladder dome.  A bipolar loop was then utilized to resect completely resect the tumor with superficial and deep margins obtained for permanent section.  The area of resection was then extensively fulgurated until hemostasis was achieved.  A cystogram was obtained and revealed no evidence of intra or extraperitoneal bladder perforation.  The patient's bladder was drained.  He tolerated procedure well and was transferred to the postanesthesia in stable  condition.  Plan: Follow-up on 01/18/2023 to discuss pathology results

## 2023-01-03 NOTE — Anesthesia Postprocedure Evaluation (Signed)
Anesthesia Post Note  Patient: Danny [name]  Procedure(s) Performed: TRANSURETHRAL RESECTION OF BLADDER TUMOR (TURBT)     Patient location during evaluation: PACU Anesthesia Type: General Level of consciousness: awake and alert Pain management: pain level controlled Vital Signs Assessment: post-procedure vital signs reviewed and stable Respiratory status: spontaneous breathing, nonlabored ventilation, respiratory function stable and patient connected to nasal cannula oxygen Cardiovascular status: blood pressure returned to baseline and stable Postop Assessment: no apparent nausea or vomiting Anesthetic complications: no  No notable events documented.  Last Vitals:  Vitals:   01/03/23 0930 01/03/23 0945  BP: 135/61 (!) 151/69  Pulse: (!) 57 (!) 56  Resp: 18 16  Temp:  36.6 C  SpO2: 99% 99%    Last Pain:  Vitals:   01/03/23 0945  TempSrc:   PainSc: 0-No pain                 Nasirah Sachs S

## 2023-01-03 NOTE — Progress Notes (Signed)
Patient states he fell on Wednesday.  He states he was standing in the kitchen and "just fell".  Patient hit his head however does not know what he hit.  He has a scrap on his left wrist and a scrap on the back of his head.  Patient states his daughter called first responders to evaluate however he did not go to the ER.  Patient also states he called PCP and has an appointment on Friday.  He also called Dr. Sande Brothers office who told him to come in today for surgery.

## 2023-01-04 ENCOUNTER — Encounter (HOSPITAL_COMMUNITY): Payer: Self-pay | Admitting: Urology

## 2023-01-05 ENCOUNTER — Ambulatory Visit: Payer: Medicare PPO | Admitting: Internal Medicine

## 2023-01-05 DIAGNOSIS — R338 Other retention of urine: Secondary | ICD-10-CM | POA: Diagnosis not present

## 2023-01-05 LAB — SURGICAL PATHOLOGY

## 2023-01-08 DIAGNOSIS — D414 Neoplasm of uncertain behavior of bladder: Secondary | ICD-10-CM | POA: Diagnosis not present

## 2023-01-08 DIAGNOSIS — N170 Acute kidney failure with tubular necrosis: Secondary | ICD-10-CM | POA: Diagnosis not present

## 2023-01-08 DIAGNOSIS — C679 Malignant neoplasm of bladder, unspecified: Secondary | ICD-10-CM | POA: Diagnosis not present

## 2023-01-08 DIAGNOSIS — R338 Other retention of urine: Secondary | ICD-10-CM | POA: Diagnosis not present

## 2023-01-09 ENCOUNTER — Ambulatory Visit (INDEPENDENT_AMBULATORY_CARE_PROVIDER_SITE_OTHER): Payer: Medicare PPO | Admitting: Internal Medicine

## 2023-01-09 ENCOUNTER — Encounter: Payer: Self-pay | Admitting: Internal Medicine

## 2023-01-09 VITALS — BP 120/64 | HR 55 | Temp 98.1°F | Ht 64.0 in | Wt 130.0 lb

## 2023-01-09 DIAGNOSIS — S0001XA Abrasion of scalp, initial encounter: Secondary | ICD-10-CM

## 2023-01-09 DIAGNOSIS — C679 Malignant neoplasm of bladder, unspecified: Secondary | ICD-10-CM

## 2023-01-09 DIAGNOSIS — I10 Essential (primary) hypertension: Secondary | ICD-10-CM | POA: Diagnosis not present

## 2023-01-09 DIAGNOSIS — N1831 Chronic kidney disease, stage 3a: Secondary | ICD-10-CM | POA: Diagnosis not present

## 2023-01-09 NOTE — Progress Notes (Unsigned)
Patient ID: Danny [name], male   DOB: 07-21-1933, 89 y.o.   MRN: [id number]        Chief Complaint: follow up post scalp abrasion after fall       HPI:  Danny [name] is a 89 y.o. male here with c/o fall backwards shortly after recent bladder tumor biopsy but Pt denies chest pain, increased sob or doe, wheezing, orthopnea, PND, increased LE swelling, palpitations, dizziness or syncope. Pt unclear as to why fell.   Did suffer an abrasion without bleeding to right post scalp.  Pt denies polydipsia, polyuria, or new focal neuro s/s.   He did not think much of this, but duaghter asked him to come today.   Pt denies fever, wt loss, night sweats, loss of appetite, or other constitutional symptoms   Pt not on anti-coagulant Wt Readings from Last 3 Encounters:  01/09/23 130 lb (59 kg)  01/03/23 127 lb (57.6 kg)  12/27/22 127 lb (57.6 kg)   BP Readings from Last 3 Encounters:  01/09/23 120/64  01/03/23 (!) 166/72  12/27/22 (!) 158/79         Past Medical History:  Diagnosis Date   (HFpEF) heart failure with preserved ejection fraction (HCC)    Echo 1/23: Inferior AK, mild aortic stenosis (mean 9 mmHg, V-max 210.5 cm/s, DI 0.70), EF 55-60, GR 1 DD, normal RVSF, normal PASP, trivial MR, mild AI, borderline dilation of aortic root (39 mm)   Acid reflux    hiatal hernia   CAD (coronary artery disease)    S/p non-STEMI 1/23 >> s/p CABG   Carotid artery disease (HCC) 08/23/2021   Pre-CABG Dopplers 1/23:R ICA 40-59; L ICA 1-39   Chronic kidney disease (CKD)    Coronary artery disease involving native coronary artery of native heart without angina pectoris 05/16/2022   Inf NSTEMI s/p CABG in 07/2021 (RIMA-LAD, LIMA-OM1, S-OM2)   Diverticulitis    Erectile dysfunction 04/20/2015   Essential hypertension 04/20/2015   History of colon polyps    History of stroke    Noted on brain MRI 07/2021   Hyperlipidemia 05/28/2015   PAD (peripheral artery disease) (HCC)    Pre-CABG Dopplers 1/23: Right ABI  0.65; left ABI 0.82   Stroke [facility])    Past Surgical History:  Procedure Laterality Date   APPENDECTOMY     cataract surgery     CORONARY ARTERY BYPASS GRAFT N/A 08/01/2021   Procedure: CORONARY ARTERY BYPASS GRAFTING (CABG) X 3  ,ON PUMP, USING LEFT AND RIGHT INTERNAL MAMMARY ARTERIES, RIGHT AND LEFT ENDOSCOPIC GREATER SAPHENOUS VEIN CONDUITS;  Surgeon: Alleen Borne, MD;  Location: [facility] OR;  Service: Open Heart Surgery;  Laterality: N/A;   ENDOVEIN HARVEST OF GREATER SAPHENOUS VEIN Right 08/01/2021   Procedure: ENDOVEIN HARVEST OF GREATER SAPHENOUS VEIN;  Surgeon: Alleen Borne, MD;  Location: [facility] OR;  Service: Open Heart Surgery;  Laterality: Right;   LEFT HEART CATH AND CORONARY ANGIOGRAPHY N/A 07/27/2021   Procedure: LEFT HEART CATH AND CORONARY ANGIOGRAPHY;  Surgeon: Iran Ouch, MD;  Location: [facility] INVASIVE CV LAB;  Service: Cardiovascular;  Laterality: N/A;   TEE WITHOUT CARDIOVERSION N/A 08/01/2021   Procedure: TRANSESOPHAGEAL ECHOCARDIOGRAM (TEE);  Surgeon: Alleen Borne, MD;  Location: [facility] OR;  Service: Open Heart Surgery;  Laterality: N/A;   TRANSURETHRAL RESECTION OF BLADDER TUMOR N/A 01/03/2023   Procedure: TRANSURETHRAL RESECTION OF BLADDER TUMOR (TURBT);  Surgeon: Rene Paci, MD;  Location: [facility] ORS;  Service: Urology;  Laterality: N/A;  90 MINUTES   TRANSURETHRAL RESECTION OF PROSTATE      reports that he has quit smoking. His smoking use included cigars. He has never used smokeless tobacco. He reports that he does not drink alcohol and does not use drugs. family history includes Stroke (age of onset: 12) in his father; Sudden death (age of onset: 64) in his mother. Allergies  Allergen Reactions   Lipitor [Atorvastatin]     Muscle soreness   Current Outpatient Medications on File Prior to Visit  Medication Sig Dispense Refill   ezetimibe (ZETIA) 10 MG tablet Take 1 tablet by mouth once daily 90 tablet 3   furosemide (LASIX) 20 MG tablet Take 1 tablet (20  mg total) by mouth daily. 90 tablet 3   metoprolol tartrate (LOPRESSOR) 25 MG tablet Take 1 tablet by mouth twice daily 180 tablet 3   phenazopyridine (PYRIDIUM) 200 MG tablet Take 1 tablet (200 mg total) by mouth 3 (three) times daily as needed (for pain with urination). 30 tablet 0   rosuvastatin (CRESTOR) 20 MG tablet Take 1 tablet by mouth once daily (Patient taking differently: Take 20 mg by mouth at bedtime.) 90 tablet 3   tamsulosin (FLOMAX) 0.4 MG CAPS capsule Take 0.4 mg by mouth daily.     nitroGLYCERIN (NITROSTAT) 0.4 MG SL tablet Place 1 tablet (0.4 mg total) under the tongue every 5 (five) minutes as needed for chest pain. 25 tablet 3   No current facility-administered medications on file prior to visit.        ROS:  All others reviewed and negative.  Objective        PE:  BP 120/64 (BP Location: Left Arm, Patient Position: Sitting, Cuff Size: Normal)   Pulse (!) 55   Temp 98.1 F (36.7 C) (Oral)   Ht 5\' 4"  (1.626 m)   Wt 130 lb (59 kg)   SpO2 99%   BMI 22.31 kg/m                 Constitutional: Pt appears in NAD               HENT: Head: NCAT.                Right Ear: External ear normal.                 Left Ear: External ear normal.                Eyes: . Pupils are equal, round, and reactive to light. Conjunctivae and EOM are normal               Nose: without d/c or deformity               Neck: Neck supple. Gross normal ROM               Cardiovascular: Normal rate and regular rhythm.                 Pulmonary/Chest: Effort normal and breath sounds without rales or wheezing.                Abd:  Soft, NT, ND, + BS, no organomegaly               Neurological: Pt is alert. At baseline orientation, motor grossly intact               Skin: Skin is warm. LE edema - none, right post scalp with scabbed  abrasion healing well without s/s infeciton               Psychiatric: Pt behavior is normal without agitation   Micro: none  Cardiac tracings I have personally  interpreted today:  none  Pertinent Radiological findings (summarize): none   Lab Results  Component Value Date   WBC 5.3 12/27/2022   HGB 10.3 (L) 12/27/2022   HCT 30.6 (L) 12/27/2022   PLT 136 (L) 12/27/2022   GLUCOSE 91 12/27/2022   CHOL 192 10/10/2022   TRIG 54.0 10/10/2022   HDL 66.80 10/10/2022   LDLCALC 114 (H) 10/10/2022   ALT 49 10/10/2022   AST 21 10/10/2022   NA 136 12/27/2022   K 5.1 12/27/2022   CL 105 12/27/2022   CREATININE 1.20 12/27/2022   BUN 31 (H) 12/27/2022   CO2 26 12/27/2022   TSH 1.029 07/27/2021   INR 1.5 (H) 08/01/2021   HGBA1C 5.5 07/27/2021   Assessment/Plan:  Danny [name] is a 89 y.o. White or Caucasian [1] male with  has a past medical history of (HFpEF) heart failure with preserved ejection fraction (HCC), Acid reflux, CAD (coronary artery disease), Carotid artery disease (HCC) (08/23/2021), Chronic kidney disease (CKD), Coronary artery disease involving native coronary artery of native heart without angina pectoris (05/16/2022), Diverticulitis, Erectile dysfunction (04/20/2015), Essential hypertension (04/20/2015), History of colon polyps, History of stroke, Hyperlipidemia (05/28/2015), PAD (peripheral artery disease) (HCC), and Stroke (HCC).  Scalp abrasion With fall for unclear reason several days ago after bladder tumor biopsy and tx, after d/w pt he declines stat Head CT for now,  to f/u any worsening symptoms or concerns  Essential hypertension BP Readings from Last 3 Encounters:  01/09/23 120/64  01/03/23 (!) 166/72  12/27/22 (!) 158/79   Stable, pt to continue medical treatment lopressor 25 bid   CKD (chronic kidney disease) stage 3, GFR 30-59 ml/min (HCC) Lab Results  Component Value Date   CREATININE 1.20 12/27/2022   Stable overall, cont to avoid nephrotoxins   Bladder cancer (HCC) Recent finding and tx, Pt to f/u urology as planned  Followup: Return if symptoms worsen or fail to improve.  Oliver Barre, MD 01/11/2023 4:43  PM [facility]

## 2023-01-09 NOTE — Patient Instructions (Signed)
Please continue all other medications as before, and refills have been done if requested.  Please have the pharmacy call with any other refills you may need.  Please continue your efforts at being more active, low cholesterol diet, and weight control.  Please keep your appointments with your specialists as you may have planned  Please call if you change your mind about the CT scan for the head

## 2023-01-10 ENCOUNTER — Other Ambulatory Visit (HOSPITAL_COMMUNITY): Payer: Self-pay

## 2023-01-11 ENCOUNTER — Encounter: Payer: Self-pay | Admitting: Internal Medicine

## 2023-01-11 DIAGNOSIS — S0001XA Abrasion of scalp, initial encounter: Secondary | ICD-10-CM | POA: Insufficient documentation

## 2023-01-11 DIAGNOSIS — C679 Malignant neoplasm of bladder, unspecified: Secondary | ICD-10-CM | POA: Insufficient documentation

## 2023-01-11 NOTE — Assessment & Plan Note (Signed)
Recent finding and tx, Pt to f/u urology as planned

## 2023-01-11 NOTE — Assessment & Plan Note (Signed)
With fall for unclear reason several days ago after bladder tumor biopsy and tx, after d/w pt he declines stat Head CT for now,  to f/u any worsening symptoms or concerns

## 2023-01-11 NOTE — Assessment & Plan Note (Signed)
BP Readings from Last 3 Encounters:  01/09/23 120/64  01/03/23 (!) 166/72  12/27/22 (!) 158/79   Stable, pt to continue medical treatment lopressor 25 bid

## 2023-01-11 NOTE — Assessment & Plan Note (Signed)
Lab Results  Component Value Date   CREATININE 1.20 12/27/2022   Stable overall, cont to avoid nephrotoxins

## 2023-01-15 ENCOUNTER — Ambulatory Visit: Payer: Medicare PPO | Admitting: Podiatry

## 2023-01-15 ENCOUNTER — Encounter: Payer: Self-pay | Admitting: Podiatry

## 2023-01-15 DIAGNOSIS — B351 Tinea unguium: Secondary | ICD-10-CM | POA: Diagnosis not present

## 2023-01-15 DIAGNOSIS — M79675 Pain in left toe(s): Secondary | ICD-10-CM

## 2023-01-15 DIAGNOSIS — I739 Peripheral vascular disease, unspecified: Secondary | ICD-10-CM | POA: Diagnosis not present

## 2023-01-15 DIAGNOSIS — M79674 Pain in right toe(s): Secondary | ICD-10-CM

## 2023-01-15 NOTE — Progress Notes (Signed)
  Subjective:  Patient ID: Danny [name], male    DOB: 1934-02-11,   MRN: [id number]  Chief Complaint  Patient presents with   Nail Problem     Routine foot care    89 y.o. male presents for concern of thickened elongated and painful nails that are difficult to trim. Requesting to have them trimmed today. Denies any burning or tingling in her feet. He has a history of heart surgery  and PAD.   Marland Kitchen Denies any other pedal complaints. Denies n/v/f/c.   Past Medical History:  Diagnosis Date   (HFpEF) heart failure with preserved ejection fraction (HCC)    Echo 1/23: Inferior AK, mild aortic stenosis (mean 9 mmHg, V-max 210.5 cm/s, DI 0.70), EF 55-60, GR 1 DD, normal RVSF, normal PASP, trivial MR, mild AI, borderline dilation of aortic root (39 mm)   Acid reflux    hiatal hernia   CAD (coronary artery disease)    S/p non-STEMI 1/23 >> s/p CABG   Carotid artery disease (HCC) 08/23/2021   Pre-CABG Dopplers 1/23:R ICA 40-59; L ICA 1-39   Chronic kidney disease (CKD)    Coronary artery disease involving native coronary artery of native heart without angina pectoris 05/16/2022   Inf NSTEMI s/p CABG in 07/2021 (RIMA-LAD, LIMA-OM1, S-OM2)   Diverticulitis    Erectile dysfunction 04/20/2015   Essential hypertension 04/20/2015   History of colon polyps    History of stroke    Noted on brain MRI 07/2021   Hyperlipidemia 05/28/2015   PAD (peripheral artery disease) (HCC)    Pre-CABG Dopplers 1/23: Right ABI 0.65; left ABI 0.82   Stroke (HCC)     Objective:  Physical Exam: Vascular: DP/PT pulses 1/4 bilateral (edema). CFT <3 seconds. Absent hair growth. Bilateral edema and xerosis noted.  Skin. No lacerations or abrasions bilateral feet.  Nails 1-5 bilateral are thickened discolored with subungual debris.  Musculoskeletal: MMT 5/5 bilateral lower extremities in DF, PF, Inversion and Eversion. Deceased ROM in DF of ankle joint.  Neurological: Sensation intact to light touch.   Assessment:    1. Pain due to onychomycosis of toenails of both feet   2. PAD (peripheral artery disease) (HCC)   3. Pre-ulcerative calluses        Plan:  Patient was evaluated and treated and all questions answered. -Discussed and educated patient on foot care, especially with  regards to the vascular, neurological and musculoskeletal systems.  -Discussed supportive shoes at all times and checking feet regularly.  -Mechanically debrided all nails 1-5 bilateral using sterile nail nipper and filed with dremel without incident  -Answered all patient questions -Patient to return  in 3 months for at risk foot care -Patient advised to call the office if any problems or questions arise in the meantime.   Louann Sjogren, DPM

## 2023-01-18 DIAGNOSIS — R338 Other retention of urine: Secondary | ICD-10-CM | POA: Diagnosis not present

## 2023-01-18 DIAGNOSIS — C674 Malignant neoplasm of posterior wall of bladder: Secondary | ICD-10-CM | POA: Diagnosis not present

## 2023-01-30 DIAGNOSIS — C674 Malignant neoplasm of posterior wall of bladder: Secondary | ICD-10-CM | POA: Diagnosis not present

## 2023-01-30 DIAGNOSIS — R338 Other retention of urine: Secondary | ICD-10-CM | POA: Diagnosis not present

## 2023-01-30 DIAGNOSIS — R8271 Bacteriuria: Secondary | ICD-10-CM | POA: Diagnosis not present

## 2023-02-13 DIAGNOSIS — C674 Malignant neoplasm of posterior wall of bladder: Secondary | ICD-10-CM | POA: Diagnosis not present

## 2023-02-13 DIAGNOSIS — Z5111 Encounter for antineoplastic chemotherapy: Secondary | ICD-10-CM | POA: Diagnosis not present

## 2023-02-20 DIAGNOSIS — R8271 Bacteriuria: Secondary | ICD-10-CM | POA: Diagnosis not present

## 2023-02-20 DIAGNOSIS — C674 Malignant neoplasm of posterior wall of bladder: Secondary | ICD-10-CM | POA: Diagnosis not present

## 2023-02-20 DIAGNOSIS — Z5111 Encounter for antineoplastic chemotherapy: Secondary | ICD-10-CM | POA: Diagnosis not present

## 2023-02-27 DIAGNOSIS — C674 Malignant neoplasm of posterior wall of bladder: Secondary | ICD-10-CM | POA: Diagnosis not present

## 2023-02-27 DIAGNOSIS — Z5111 Encounter for antineoplastic chemotherapy: Secondary | ICD-10-CM | POA: Diagnosis not present

## 2023-03-06 DIAGNOSIS — C674 Malignant neoplasm of posterior wall of bladder: Secondary | ICD-10-CM | POA: Diagnosis not present

## 2023-03-06 DIAGNOSIS — Z5111 Encounter for antineoplastic chemotherapy: Secondary | ICD-10-CM | POA: Diagnosis not present

## 2023-03-13 DIAGNOSIS — C674 Malignant neoplasm of posterior wall of bladder: Secondary | ICD-10-CM | POA: Diagnosis not present

## 2023-03-13 DIAGNOSIS — Z5111 Encounter for antineoplastic chemotherapy: Secondary | ICD-10-CM | POA: Diagnosis not present

## 2023-03-20 DIAGNOSIS — C674 Malignant neoplasm of posterior wall of bladder: Secondary | ICD-10-CM | POA: Diagnosis not present

## 2023-03-20 DIAGNOSIS — Z5111 Encounter for antineoplastic chemotherapy: Secondary | ICD-10-CM | POA: Diagnosis not present

## 2023-04-18 ENCOUNTER — Ambulatory Visit: Payer: Medicare PPO | Admitting: Podiatry

## 2023-05-02 ENCOUNTER — Ambulatory Visit: Payer: Medicare PPO | Admitting: Podiatry

## 2023-06-06 ENCOUNTER — Ambulatory Visit: Payer: Medicare PPO | Admitting: Podiatry

## 2023-07-19 DIAGNOSIS — R8271 Bacteriuria: Secondary | ICD-10-CM | POA: Diagnosis not present

## 2023-07-19 DIAGNOSIS — C674 Malignant neoplasm of posterior wall of bladder: Secondary | ICD-10-CM | POA: Diagnosis not present

## 2023-07-19 DIAGNOSIS — R3914 Feeling of incomplete bladder emptying: Secondary | ICD-10-CM | POA: Diagnosis not present

## 2023-07-20 ENCOUNTER — Ambulatory Visit (HOSPITAL_COMMUNITY)
Admission: RE | Admit: 2023-07-20 | Discharge: 2023-07-20 | Disposition: A | Discharge status: Home / Self Care | Payer: Medicare PPO | Source: Ambulatory Visit | Attending: Physician Assistant | Admitting: Physician Assistant

## 2023-07-20 DIAGNOSIS — I6523 Occlusion and stenosis of bilateral carotid arteries: Secondary | ICD-10-CM | POA: Insufficient documentation

## 2023-07-23 ENCOUNTER — Encounter: Payer: Self-pay | Admitting: Physician Assistant

## 2023-07-30 ENCOUNTER — Encounter: Payer: Self-pay | Admitting: *Deleted

## 2023-07-30 ENCOUNTER — Telehealth: Payer: Self-pay | Admitting: *Deleted

## 2023-07-30 DIAGNOSIS — I6523 Occlusion and stenosis of bilateral carotid arteries: Secondary | ICD-10-CM

## 2023-07-30 NOTE — Telephone Encounter (Signed)
-----   Message from Tereso Newcomer sent at 07/23/2023  5:03 PM EST ----- Stable plaque in both carotid arteries.  I have sent a copy to PCP as FYI. PLAN:  -  Continue current medications/treatment plan and follow up as scheduled.  -  Repeat carotid US 1 year Tereso Newcomer, [place]    07/23/2023 5:01 PM

## 2023-08-15 ENCOUNTER — Other Ambulatory Visit: Payer: Self-pay | Admitting: Urology

## 2023-08-15 ENCOUNTER — Telehealth: Payer: Self-pay | Admitting: Cardiovascular Disease

## 2023-08-15 NOTE — Telephone Encounter (Signed)
   Pre-operative Risk Assessment    Patient Name: JARMARCUS WAMBOLD  DOB: 1933/11/05 MRN: [id number]      Request for Surgical Clearance    Procedure:   TRANSURETHRAL RESECTION OF BLADDER TUMOR (TURBT) with GEMCITABINE  Date of Surgery:  Clearance 08/24/23                                 Surgeon:  Devere Lonni Righter, MD  Surgeon's Group or Practice Name:  [facility] Phone number:  [phone] Fax number:  [phone]   Type of Clearance Requested:   - Medical  - Pharmacy:  Hold Aspirin  5 days prior   Type of Anesthesia:  General    Additional requests/questions:    Bonney Rosina LOISE Bluford   08/15/2023, 1:08 PM

## 2023-08-15 NOTE — Telephone Encounter (Signed)
   Name: Danny [name]  DOB: 1934/03/10  MRN: [id number]  Primary Cardiologist: Aleene Passe, MD   Preoperative team, please contact this patient and set up a phone call appointment for further preoperative risk assessment. Please obtain consent and complete medication review. Thank you for your help.  I confirm that guidance regarding antiplatelet and oral anticoagulation therapy has been completed and, if necessary, noted below.  Per protocol patient can hold aspirin  5 to 7 days prior to procedure and should restart postprocedure when surgically safe.  I also confirmed the patient resides in the state of [place] . As per [facility] Medical Board telemedicine laws, the patient must reside in the state in which the provider is licensed.   Wyn Raddle, Jackee Shove, NP 08/15/2023, 1:18 PM [facility]

## 2023-08-15 NOTE — Telephone Encounter (Signed)
Tried to reach the pt though vm not set up, could not leave message to call back. I will send notes to requesting office as FYI if they s/w the pt to please let him now he needs to call our office for appt for preop clearance. [phone].

## 2023-08-21 NOTE — Telephone Encounter (Signed)
We have tried to reach the pt again, but vm is not set up, cannot leave message to call back. I will update the requesting office as FYI.

## 2023-08-22 ENCOUNTER — Encounter (HOSPITAL_COMMUNITY): Payer: Self-pay

## 2023-08-22 NOTE — Progress Notes (Signed)
COVID Vaccine Completed:  Yes  Date of COVID positive in last 90 days:  PCP - Hillard Danker, MD Cardiologist - Kristeen Miss, MD (needs appointment for clearance)  Chest x-ray -  EKG - 11-14-22 Epic Stress Test - 10-24-05 Epic ECHO - 08-01-21 Epic Cardiac Cath - 07-27-21 Epic Pacemaker/ICD device last checked: Spinal Cord Stimulator:  Bowel Prep -   Sleep Study -  CPAP -   Fasting Blood Sugar -  Checks Blood Sugar _____ times a day  Last dose of GLP1 agonist-  N/A GLP1 instructions:  Hold 7 days before surgery    Last dose of SGLT-2 inhibitors-  N/A SGLT-2 instructions:  Hold 3 days before surgery    Blood Thinner Instructions:  Last dose:   Time: Aspirin Instructions:  ASA 81 Last Dose:  Activity level:  Can go up a flight of stairs and perform activities of daily living without stopping and without symptoms of chest pain or shortness of breath.  Able to exercise without symptoms  Unable to go up a flight of stairs without symptoms of     Anesthesia review:  hx of CABG, Aortic stenosis, CHF, CAD, PAD, HTN, CKD, hx of stroke  Patient denies shortness of breath, fever, cough and chest pain at PAT appointment  Patient verbalized understanding of instructions that were given to them at the PAT appointment. Patient was also instructed that they will need to review over the PAT instructions again at home before surgery.

## 2023-08-22 NOTE — Patient Instructions (Signed)
SURGICAL WAITING ROOM VISITATION Patients having surgery or a procedure may have no more than 2 support people in the waiting area - these visitors may rotate.    Children under the age of 6 must have an adult with them who is not the patient.  Due to an increase in RSV and influenza rates and associated hospitalizations, children ages 12 and under may not visit patients in [facility] hospitals.   If the patient needs to stay at the hospital during part of their recovery, the visitor guidelines for inpatient rooms apply. Pre-op nurse will coordinate an appropriate time for 1 support person to accompany patient in pre-op.  This support person may not rotate.    Please refer to the [facility] website for the visitor guidelines for Inpatients (after your surgery is over and you are in a regular room).       Your procedure is scheduled on: 08-24-23   Report to [facility] Main Entrance    Report to admitting at 11:15 AM   Call this number if you have problems the morning of surgery [phone]   Do not eat food or drink liquids:After Midnight.          If you have questions, please contact your surgeon's office.   FOLLOW  ANY ADDITIONAL PRE OP INSTRUCTIONS YOU RECEIVED FROM YOUR SURGEON'S OFFICE!!!     Oral Hygiene is also important to reduce your risk of infection.                                    Remember - BRUSH YOUR TEETH THE MORNING OF SURGERY WITH YOUR REGULAR TOOTHPASTE   Do NOT smoke after Midnight   Take these medicines the morning of surgery with A SIP OF WATER:    Metoprolol  Stop all vitamins and herbal supplements 7 days before surgery  Bring CPAP mask and tubing day of surgery.                              You may not have any metal on your body including  jewelry, and body piercing             Do not wear lotions, powders, cologne, or deodorant              Men may shave face and neck.   Do not bring valuables to the hospital. [facility] IS  NOT RESPONSIBLE   FOR VALUABLES.   Contacts, dentures or bridgework may not be worn into surgery.  DO NOT BRING YOUR HOME MEDICATIONS TO THE HOSPITAL. PHARMACY WILL DISPENSE MEDICATIONS LISTED ON YOUR MEDICATION LIST TO YOU DURING YOUR ADMISSION IN THE HOSPITAL!    Patients discharged on the day of surgery will not be allowed to drive home.  Someone NEEDS to stay with you for the first 24 hours after anesthesia.   Special Instructions: Bring a copy of your healthcare power of attorney and living will documents the day of surgery if you haven't scanned them before.              Please read over the following fact sheets you were given: IF YOU HAVE QUESTIONS ABOUT YOUR PRE-OP INSTRUCTIONS PLEASE CALL [phone] [place]  If you received a COVID test during your pre-op visit  it is requested that you wear a mask when out in public, stay  away from anyone that may not be feeling well and notify your surgeon if you develop symptoms. If you test positive for Covid or have been in contact with anyone that has tested positive in the last 10 days please notify you surgeon.  [place] - Preparing for Surgery Before surgery, you can play an important role.  Because skin is not sterile, your skin needs to be as free of germs as possible.  You can reduce the number of germs on your skin by washing with CHG (chlorahexidine gluconate) soap before surgery.  CHG is an antiseptic cleaner which kills germs and bonds with the skin to continue killing germs even after washing. Please DO NOT use if you have an allergy to CHG or antibacterial soaps.  If your skin becomes reddened/irritated stop using the CHG and inform your nurse when you arrive at Short Stay. Do not shave (including legs and underarms) for at least 48 hours prior to the first CHG shower.  You may shave your face/neck.  Please follow these instructions carefully:  1.  Shower with CHG Soap the night before surgery and the  morning of surgery.  2.  If  you choose to wash your hair, wash your hair first as usual with your normal  shampoo.  3.  After you shampoo, rinse your hair and body thoroughly to remove the shampoo.                             4.  Use CHG as you would any other liquid soap.  You can apply chg directly to the skin and wash.  Gently with a scrungie or clean washcloth.  5.  Apply the CHG Soap to your body ONLY FROM THE NECK DOWN.   Do   not use on face/ open                           Wound or open sores. Avoid contact with eyes, ears mouth and   genitals (private parts).                       Wash face,  Genitals (private parts) with your normal soap.             6.  Wash thoroughly, paying special attention to the area where your    surgery  will be performed.  7.  Thoroughly rinse your body with warm water from the neck down.  8.  DO NOT shower/wash with your normal soap after using and rinsing off the CHG Soap.                9.  Pat yourself dry with a clean towel.            10.  Wear clean pajamas.            11.  Place clean sheets on your bed the night of your first shower and do not  sleep with pets. Day of Surgery : Do not apply any lotions/deodorants the morning of surgery.  Please wear clean clothes to the hospital/surgery center.  FAILURE TO FOLLOW THESE INSTRUCTIONS MAY RESULT IN THE CANCELLATION OF YOUR SURGERY  PATIENT SIGNATURE_________________________________  NURSE SIGNATURE__________________________________  ________________________________________________________________________

## 2023-08-23 ENCOUNTER — Encounter (HOSPITAL_COMMUNITY)
Admission: RE | Admit: 2023-08-23 | Discharge: 2023-08-23 | Disposition: A | Discharge status: Home / Self Care | Payer: Medicare PPO | Source: Ambulatory Visit | Attending: Urology | Admitting: Urology

## 2023-08-23 ENCOUNTER — Encounter (HOSPITAL_COMMUNITY): Payer: Self-pay | Admitting: Physician Assistant

## 2023-08-23 ENCOUNTER — Other Ambulatory Visit: Payer: Self-pay

## 2023-08-23 ENCOUNTER — Encounter (HOSPITAL_COMMUNITY): Payer: Self-pay

## 2023-08-23 VITALS — BP 151/98 | HR 59 | Temp 97.9°F | Resp 16 | Ht 64.0 in | Wt 120.4 lb

## 2023-08-23 DIAGNOSIS — Z01812 Encounter for preprocedural laboratory examination: Secondary | ICD-10-CM | POA: Diagnosis not present

## 2023-08-23 DIAGNOSIS — D649 Anemia, unspecified: Secondary | ICD-10-CM | POA: Diagnosis not present

## 2023-08-23 DIAGNOSIS — I10 Essential (primary) hypertension: Secondary | ICD-10-CM | POA: Diagnosis not present

## 2023-08-23 DIAGNOSIS — Z01818 Encounter for other preprocedural examination: Secondary | ICD-10-CM | POA: Diagnosis present

## 2023-08-23 HISTORY — DX: Malignant neoplasm of bladder, unspecified: C67.9

## 2023-08-23 HISTORY — DX: Anemia, unspecified: D64.9

## 2023-08-23 HISTORY — DX: Unspecified malignant neoplasm of skin, unspecified: C44.90

## 2023-08-23 HISTORY — DX: Personal history of urinary calculi: Z87.442

## 2023-08-23 LAB — CBC
HCT: 31.5 % — ABNORMAL LOW (ref 39.0–52.0)
Hemoglobin: 10.5 g/dL — ABNORMAL LOW (ref 13.0–17.0)
MCH: 34.1 pg — ABNORMAL HIGH (ref 26.0–34.0)
MCHC: 33.3 g/dL (ref 30.0–36.0)
MCV: 102.3 fL — ABNORMAL HIGH (ref 80.0–100.0)
Platelets: 136 10*3/uL — ABNORMAL LOW (ref 150–400)
RBC: 3.08 MIL/uL — ABNORMAL LOW (ref 4.22–5.81)
RDW: 14.5 % (ref 11.5–15.5)
WBC: 3.2 10*3/uL — ABNORMAL LOW (ref 4.0–10.5)
nRBC: 0 % (ref 0.0–0.2)

## 2023-08-23 LAB — BASIC METABOLIC PANEL
Anion gap: 8 (ref 5–15)
BUN: 39 mg/dL — ABNORMAL HIGH (ref 8–23)
CO2: 25 mmol/L (ref 22–32)
Calcium: 9.3 mg/dL (ref 8.9–10.3)
Chloride: 106 mmol/L (ref 98–111)
Creatinine, Ser: 1.28 mg/dL — ABNORMAL HIGH (ref 0.61–1.24)
GFR, Estimated: 54 mL/min — ABNORMAL LOW (ref >60)
Glucose, Bld: 98 mg/dL (ref 70–99)
Potassium: 4.7 mmol/L (ref 3.5–5.1)
Sodium: 139 mmol/L (ref 135–145)

## 2023-08-23 NOTE — Telephone Encounter (Signed)
I tried to call the surgeon office again today, but unable to get through. We have attempted to reach the pt to schedule a tele preop appt but have not been able to reach the pt.    We have faxed notes as FYI to surgeon office, but have not heard back from their office.   I will fax notes again today.

## 2023-08-24 ENCOUNTER — Ambulatory Visit (HOSPITAL_COMMUNITY): Admission: RE | Admit: 2023-08-24 | Payer: Medicare PPO | Source: Home / Self Care | Admitting: Urology

## 2023-08-24 ENCOUNTER — Encounter (HOSPITAL_COMMUNITY): Admission: RE | Payer: Self-pay | Source: Home / Self Care

## 2023-08-24 SURGERY — TURBT, WITH CHEMOTHERAPEUTIC AGENT INSTILLATION INTO BLADDER
Anesthesia: General

## 2023-08-24 NOTE — Telephone Encounter (Signed)
Called patient back to set up a pre-op in office appointment on 09/03/23 @ 2:45.

## 2023-08-24 NOTE — Telephone Encounter (Signed)
Patient is returning call and is requesting call back.

## 2023-09-02 NOTE — Progress Notes (Unsigned)
 Cardiology Office Note:    Date:  09/03/2023  ID:  KAYCEON OKI, DOB 1934-02-13, MRN [id number] PCP: Myrlene Broker, MD  [facility] Providers Cardiologist:  Kristeen Miss, MD       Patient Profile:      Coronary artery disease Inf NSTEMI s/p CABG in 07/2021 RIMA-LAD, LIMA-OM1, S-OM2 Ischemic CM EF 40-45 by LV gram 1/23 Echocardiogram 07/28/21: EF 55-60, inf AK, mild AS (mean 9), Gr 1 DD, NL RVSF, NL PASP (HFpEF) heart failure with preserved ejection fraction  Aortic stenosis  Mild - Echo 1/23: Mean gradient 9 mmHg, Vmax 210.5 cm/s, DI 0.70 Carotid artery disease [place] 07/20/23: RICA 40-59; LICA 1-39  Peripheral arterial disease ABIs 1/23: R 0.65; L 0.82 Hypertension  Hyperlipidemia  Myalgias with Atorvastatin  Chronic kidney disease stage III 08/2023: GFR 54 History of CVA (MRI 1/23: Chronic lacunar infarct left thalamus) Hx of syncope GERD ED Borderline dilation of aortic root (39 mm) - Echocardiogram 07/2021  Anemia             Discussed the use of AI scribe software for clinical note transcription with the patient, who gave verbal consent to proceed. History of Present Illness Caydan Mctavish Clinkscales is a 89 y.o. male who presents for surgical clearance for a transurethral resection of a bladder tumor. He was last seen by Dr. Elease Hashimoto in 11/2022. He needs TURBT under general anesthesia with Dr. Liliane Shi. The patient reports no new symptoms or changes in his condition. The patient reports no chest pain or pressure, shortness of breath, swelling in the legs, or dizziness. The patient does report occasional costochondritis, which is exacerbated by lifting. This has been a long-standing issue for the patient, dating back to the 30s. The patient is active, able to walk a block or two without stopping, and can go up and down stairs without difficulty. The patient has lost weight recently but is slowly regaining it. He has not had orthopnea, edema, syncope.  Review of Systems   Gastrointestinal:  Negative for hematochezia and melena.  -See HPI     Studies Reviewed:   EKG Interpretation Date/Time:  Monday September 03 2023 15:01:09 EST Ventricular Rate:  58 PR Interval:  186 QRS Duration:  88 QT Interval:  418 QTC Calculation: 410 R Axis:   47  Text Interpretation: Sinus bradycardia with Premature atrial complexes in a pattern of bigeminy Anteroseptal infarct , age undetermined T wave inversion lead III, aVF No significant change since last tracing Confirmed by Tereso Newcomer [id number]) on 09/03/2023 3:17:15 PM    Results LABS LDL: 114 (10/2022)   Risk Assessment/Calculations:             Physical Exam:   VS:  BP 139/74   Pulse (!) 58   Ht 5\' 4"  (1.626 m)   Wt 127 lb 9.6 oz (57.9 kg)   SpO2 95%   BMI 21.90 kg/m    Wt Readings from Last 3 Encounters:  09/03/23 127 lb 9.6 oz (57.9 kg)  08/23/23 120 lb 6.4 oz (54.6 kg)  01/09/23 130 lb (59 kg)    Constitutional:      Appearance: Healthy appearance. Not in distress.  Neck:     Vascular: JVD normal.  Pulmonary:     Breath sounds: Normal breath sounds. No wheezing. No rales.  Cardiovascular:     Normal rate. Regular rhythm.     Murmurs: There is a grade 2/6 systolic murmur at the URSB.  Edema:    Peripheral  edema absent.  Abdominal:     Palpations: Abdomen is soft.        Assessment and Plan:   Assessment & Plan Preoperative cardiovascular examination Mr. Carvell's perioperative risk of a major cardiac event is 6.6% according to the Revised Cardiac Risk Index (RCRI) which indicates high risk for perioperative CV complications.  However, his functional capacity is good at 4.31 METs according to the Duke Activity Status Index (DASI). Recommendations: According to ACC/AHA guidelines, no further cardiovascular testing needed.  The patient may proceed to surgery at acceptable risk.   Antiplatelet and/or Anticoagulation Recommendations: Aspirin can be held for 5-7 days prior to his surgery.  Please  resume Aspirin post operatively when it is felt to be safe from a bleeding standpoint.  Coronary artery disease involving native coronary artery of native heart without angina pectoris Status post non-STEMI in January 2023 followed by CABG. Currently asymptomatic without chest pain or angina. EKG shows no changes.  - Continue aspirin 81 mg daily - Continue nitroglycerin PRN - Continue Crestor 20 mg daily - Ok to hold aspirin for 5-7 days before surgery Chronic heart failure with preserved ejection fraction (HCC) NYHA II. Volume status stable. He is not on diuretic Rx.  Essential hypertension Blood pressure is well controlled. - Continue metoprolol tartrate 25 mg twice daily Hyperlipidemia LDL goal <70 LDL was above goal in April 2024 (114 mg/dL). Previous LDL was 55 mg/dL. Needs follow-up lipid panel. - Continue Crestor 20 mg daily - Arrange CMET, lipids in 3 mos Nonrheumatic aortic valve stenosis Mild aortic stenosis with mean gradient of 9 mmHg on echocardiogram in January 2023. Follow-up echocardiogram needed in 12-18 months. Bilateral carotid artery stenosis Ultrasound in January 2025 showed 40-59% stenosis in right ICA and 1-39% stenosis in left ICA. - Continue annual follow-up carotid Dopplers      Dispo:  Return in about 6 months (around 03/02/2024) for Routine Follow Up, w/ Dr. Elease Hashimoto, or Tereso Newcomer, PA-C.  Signed, Tereso Newcomer, PA-C

## 2023-09-03 ENCOUNTER — Other Ambulatory Visit: Payer: Self-pay | Admitting: *Deleted

## 2023-09-03 ENCOUNTER — Encounter: Payer: Self-pay | Admitting: Physician Assistant

## 2023-09-03 ENCOUNTER — Ambulatory Visit: Payer: Medicare PPO | Attending: Physician Assistant | Admitting: Physician Assistant

## 2023-09-03 VITALS — BP 139/74 | HR 58 | Ht 64.0 in | Wt 127.6 lb

## 2023-09-03 DIAGNOSIS — E785 Hyperlipidemia, unspecified: Secondary | ICD-10-CM | POA: Diagnosis not present

## 2023-09-03 DIAGNOSIS — I350 Nonrheumatic aortic (valve) stenosis: Secondary | ICD-10-CM | POA: Diagnosis not present

## 2023-09-03 DIAGNOSIS — I6523 Occlusion and stenosis of bilateral carotid arteries: Secondary | ICD-10-CM

## 2023-09-03 DIAGNOSIS — I10 Essential (primary) hypertension: Secondary | ICD-10-CM

## 2023-09-03 DIAGNOSIS — Z01810 Encounter for preprocedural cardiovascular examination: Secondary | ICD-10-CM | POA: Diagnosis not present

## 2023-09-03 DIAGNOSIS — Z79899 Other long term (current) drug therapy: Secondary | ICD-10-CM | POA: Diagnosis not present

## 2023-09-03 DIAGNOSIS — I5032 Chronic diastolic (congestive) heart failure: Secondary | ICD-10-CM

## 2023-09-03 DIAGNOSIS — I2510 Atherosclerotic heart disease of native coronary artery without angina pectoris: Secondary | ICD-10-CM

## 2023-09-03 NOTE — Patient Instructions (Signed)
 Medication Instructions:  The current medical regimen is effective;  continue present plan and medications.  *If you need a refill on your cardiac medications before your next appointment, please call your pharmacy*   Lab Work: Please have blood work in 3 months (CMP, Lipid)  If you have labs (blood work) drawn today and your tests are completely normal, you will receive your results only by: MyChart Message (if you have MyChart) OR A paper copy in the mail If you have any lab test that is abnormal or we need to change your treatment, we will call you to review the results.  Follow-Up: At [facility], you and your health needs are our priority.  As part of our continuing mission to provide you with exceptional heart care, we have created designated Provider Care Teams.  These Care Teams include your primary Cardiologist (physician) and Advanced Practice Providers (APPs -  Physician Assistants and Nurse Practitioners) who all work together to provide you with the care you need, when you need it.  We recommend signing up for the patient portal called "MyChart".  Sign up information is provided on this After Visit Summary.  MyChart is used to connect with patients for Virtual Visits (Telemedicine).  Patients are able to view lab/test results, encounter notes, upcoming appointments, etc.  Non-urgent messages can be sent to your provider as well.   To learn more about what you can do with MyChart, go to [email or web address].    Your next appointment:   6 month(s)  Provider:   Kristeen Miss, MD  or Tereso Newcomer, [place].      OK to hold ASA 5 to 7 days before your surgery.  Resume as soon as Careers adviser approves.   1st Floor: - Lobby - Registration  - Pharmacy  - Lab - Cafe  2nd Floor: - PV Lab - Diagnostic Testing (echo, CT, nuclear med)  3rd Floor: - Vacant  4th Floor: - TCTS (cardiothoracic surgery) - AFib Clinic - Structural Heart Clinic - Vascular Surgery  -  Vascular Ultrasound  5th Floor: - [facility] (general and EP) - Clinical Pharmacy for coumadin, hypertension, lipid, weight-loss medications, and med management appointments    Valet parking services will be available as well.

## 2023-09-03 NOTE — Assessment & Plan Note (Signed)
 NYHA II. Volume status stable. He is not on diuretic Rx.

## 2023-09-03 NOTE — Assessment & Plan Note (Signed)
 Mild aortic stenosis with mean gradient of 9 mmHg on echocardiogram in January 2023. Follow-up echocardiogram needed in 12-18 months.

## 2023-09-03 NOTE — Assessment & Plan Note (Signed)
 LDL was above goal in April 2024 (114 mg/dL). Previous LDL was 55 mg/dL. Needs follow-up lipid panel. - Continue Crestor 20 mg daily - Arrange CMET, lipids in 3 mos

## 2023-09-03 NOTE — Assessment & Plan Note (Signed)
 Ultrasound in January 2025 showed 40-59% stenosis in right ICA and 1-39% stenosis in left ICA. - Continue annual follow-up carotid Dopplers

## 2023-09-03 NOTE — Assessment & Plan Note (Signed)
 Blood pressure is well controlled.  Continue metoprolol tartrate 25 mg twice daily.

## 2023-09-03 NOTE — Assessment & Plan Note (Addendum)
 Status post non-STEMI in January 2023 followed by CABG. Currently asymptomatic without chest pain or angina. EKG shows no changes.  - Continue aspirin 81 mg daily - Continue nitroglycerin PRN - Continue Crestor 20 mg daily - Ok to hold aspirin for 5-7 days before surgery

## 2023-09-04 NOTE — Telephone Encounter (Signed)
 Note faxed to surgeon. Tereso Newcomer, PA-C    09/04/2023 5:19 PM

## 2023-10-01 ENCOUNTER — Other Ambulatory Visit: Payer: Self-pay | Admitting: Urology

## 2023-10-02 ENCOUNTER — Encounter (HOSPITAL_COMMUNITY): Payer: Self-pay

## 2023-10-02 NOTE — Patient Instructions (Addendum)
 SURGICAL WAITING ROOM VISITATION  Patients having surgery or a procedure may have no more than 2 support people in the waiting area - these visitors may rotate.    Children under the age of 25 must have an adult with them who is not the patient.  Due to an increase in RSV and influenza rates and associated hospitalizations, children ages 61 and under may not visit patients in [facility] hospitals.  Visitors with respiratory illnesses are discouraged from visiting and should remain at home.  If the patient needs to stay at the hospital during part of their recovery, the visitor guidelines for inpatient rooms apply. Pre-op nurse will coordinate an appropriate time for 1 support person to accompany patient in pre-op.  This support person may not rotate.    Please refer to the [facility] website for the visitor guidelines for Inpatients (after your surgery is over and you are in a regular room).       Your procedure is scheduled on: 10-10-23    Report to [facility] Main Entrance    Report to admitting at       11:45  AM   Call this number if you have problems the morning of surgery [phone]   Do not eat food :After Midnight.   After Midnight you may have the following liquids until __0800 ____ AM/  DAY OF SURGERY  then nothing by mouth  Water Non-Citrus Juices (without pulp, NO RED-Apple, White grape, White cranberry) Black Coffee (NO MILK/CREAM OR CREAMERS, sugar ok)  Clear Tea (NO MILK/CREAM OR CREAMERS, sugar ok) regular and decaf                             Plain Jell-O (NO RED)                                           Fruit ices (not with fruit pulp, NO RED)                                     Popsicles (NO RED)                                                               Sports drinks like Gatorade (NO RED)                           If you have questions, please contact your surgeon's office.   FOLLOW  ANY ADDITIONAL PRE OP INSTRUCTIONS YOU RECEIVED FROM  YOUR SURGEON'S OFFICE!!!     Oral Hygiene is also important to reduce your risk of infection.                                    Remember - BRUSH YOUR TEETH THE MORNING OF SURGERY WITH YOUR REGULAR TOOTHPASTE  DENTURES WILL BE REMOVED PRIOR TO SURGERY PLEASE DO NOT APPLY "Poly grip" OR ADHESIVES!!!   Do NOT smoke after  Midnight   Stop all vitamins and herbal supplements 7 days before surgery.   Take these medicines the morning of surgery with A SIP OF WATER: metoprolol, rosuvastatin    Bring CPAP mask and tubing day of surgery.                              You may not have any metal on your body including hair pins, jewelry, and body piercing             Do not wear lotions, powders, /cologne, or deodorant              Men may shave face and neck.   Do not bring valuables to the hospital. [facility] IS NOT             RESPONSIBLE   FOR VALUABLES.   Contacts, glasses, dentures or bridgework may not be worn into surgery.   Bring small overnight bag day of surgery.   DO NOT BRING YOUR HOME MEDICATIONS TO THE HOSPITAL. PHARMACY WILL DISPENSE MEDICATIONS LISTED ON YOUR MEDICATION LIST TO YOU DURING YOUR ADMISSION IN THE HOSPITAL!    Patients discharged on the day of surgery will not be allowed to drive home.  Someone NEEDS to stay with you for the first 24 hours after anesthesia.   Special Instructions: Bring a copy of your healthcare power of attorney and living will documents the day of surgery if you haven't scanned them before.              Please read over the following fact sheets you were given: IF YOU HAVE QUESTIONS ABOUT YOUR PRE-OP INSTRUCTIONS PLEASE CALL [phone]   . If you test positive for Covid or have been in contact with anyone that has tested positive in the last 10 days please notify you surgeon.     - Preparing for Surgery Before surgery, you can play an important role.  Because skin is not sterile, your skin needs to be as free of germs as  possible.  You can reduce the number of germs on your skin by washing with CHG (chlorahexidine gluconate) soap before surgery.  CHG is an antiseptic cleaner which kills germs and bonds with the skin to continue killing germs even after washing. Please DO NOT use if you have an allergy to CHG or antibacterial soaps.  If your skin becomes reddened/irritated stop using the CHG and inform your nurse when you arrive at Short Stay. Do not shave (including legs and underarms) for at least 48 hours prior to the first CHG shower.  You may shave your face/neck. Please follow these instructions carefully:  1.  Shower with CHG Soap the night before surgery and the  morning of Surgery.  2.  If you choose to wash your hair, wash your hair first as usual with your  normal  shampoo.  3.  After you shampoo, rinse your hair and body thoroughly to remove the  shampoo.                           4.  Use CHG as you would any other liquid soap.  You can apply chg directly  to the skin and wash                       Gently with a scrungie or clean washcloth.  5.  Apply the CHG Soap to your body ONLY FROM THE NECK DOWN.   Do not use on face/ open                           Wound or open sores. Avoid contact with eyes, ears mouth and genitals (private parts).                       Wash face,  Genitals (private parts) with your normal soap.             6.  Wash thoroughly, paying special attention to the area where your surgery  will be performed.  7.  Thoroughly rinse your body with warm water from the neck down.  8.  DO NOT shower/wash with your normal soap after using and rinsing off  the CHG Soap.                9.  Pat yourself dry with a clean towel.            10.  Wear clean pajamas.            11.  Place clean sheets on your bed the night of your first shower and do not  sleep with pets. Day of Surgery : Do not apply any lotions/deodorants the morning of surgery.  Please wear clean clothes to the hospital/surgery  center.  FAILURE TO FOLLOW THESE INSTRUCTIONS MAY RESULT IN THE CANCELLATION OF YOUR SURGERY PATIENT SIGNATURE_________________________________  NURSE SIGNATURE__________________________________  ________________________________________________________________________

## 2023-10-02 NOTE — Progress Notes (Addendum)
 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 Patient agrees to go over the instructions while at home for a better understanding. Patient also instructed to self quarantine after being tested for COVID-19. The opportunity to ask questions was provided.

## 2023-10-04 ENCOUNTER — Encounter (HOSPITAL_COMMUNITY): Payer: Self-pay

## 2023-10-04 ENCOUNTER — Other Ambulatory Visit: Payer: Self-pay

## 2023-10-04 ENCOUNTER — Encounter (HOSPITAL_COMMUNITY)
Admission: RE | Admit: 2023-10-04 | Discharge: 2023-10-04 | Disposition: A | Discharge status: Home / Self Care | Source: Ambulatory Visit | Attending: Urology | Admitting: Urology

## 2023-10-04 VITALS — BP 147/68 | HR 53 | Temp 97.6°F | Resp 16 | Ht 64.0 in | Wt 127.0 lb

## 2023-10-04 DIAGNOSIS — C679 Malignant neoplasm of bladder, unspecified: Secondary | ICD-10-CM | POA: Insufficient documentation

## 2023-10-04 DIAGNOSIS — I10 Essential (primary) hypertension: Secondary | ICD-10-CM

## 2023-10-04 DIAGNOSIS — Z01812 Encounter for preprocedural laboratory examination: Secondary | ICD-10-CM | POA: Insufficient documentation

## 2023-10-04 DIAGNOSIS — Z951 Presence of aortocoronary bypass graft: Secondary | ICD-10-CM | POA: Insufficient documentation

## 2023-10-04 DIAGNOSIS — I130 Hypertensive heart and chronic kidney disease with heart failure and stage 1 through stage 4 chronic kidney disease, or unspecified chronic kidney disease: Secondary | ICD-10-CM | POA: Diagnosis not present

## 2023-10-04 DIAGNOSIS — I2510 Atherosclerotic heart disease of native coronary artery without angina pectoris: Secondary | ICD-10-CM | POA: Insufficient documentation

## 2023-10-04 DIAGNOSIS — Z87891 Personal history of nicotine dependence: Secondary | ICD-10-CM | POA: Insufficient documentation

## 2023-10-04 DIAGNOSIS — I5032 Chronic diastolic (congestive) heart failure: Secondary | ICD-10-CM | POA: Diagnosis not present

## 2023-10-04 DIAGNOSIS — N189 Chronic kidney disease, unspecified: Secondary | ICD-10-CM | POA: Insufficient documentation

## 2023-10-04 DIAGNOSIS — I739 Peripheral vascular disease, unspecified: Secondary | ICD-10-CM | POA: Diagnosis not present

## 2023-10-04 DIAGNOSIS — Z7982 Long term (current) use of aspirin: Secondary | ICD-10-CM | POA: Diagnosis not present

## 2023-10-04 HISTORY — DX: Heart failure, unspecified: I50.9

## 2023-10-04 LAB — BASIC METABOLIC PANEL WITH GFR
Anion gap: 6 (ref 5–15)
BUN: 31 mg/dL — ABNORMAL HIGH (ref 8–23)
CO2: 25 mmol/L (ref 22–32)
Calcium: 9.3 mg/dL (ref 8.9–10.3)
Chloride: 107 mmol/L (ref 98–111)
Creatinine, Ser: 1.2 mg/dL (ref 0.61–1.24)
GFR, Estimated: 59 mL/min — ABNORMAL LOW (ref >60)
Glucose, Bld: 77 mg/dL (ref 70–99)
Potassium: 4.9 mmol/L (ref 3.5–5.1)
Sodium: 138 mmol/L (ref 135–145)

## 2023-10-04 LAB — CBC
HCT: 30.3 % — ABNORMAL LOW (ref 39.0–52.0)
Hemoglobin: 9.9 g/dL — ABNORMAL LOW (ref 13.0–17.0)
MCH: 34.7 pg — ABNORMAL HIGH (ref 26.0–34.0)
MCHC: 32.7 g/dL (ref 30.0–36.0)
MCV: 106.3 fL — ABNORMAL HIGH (ref 80.0–100.0)
Platelets: 130 10*3/uL — ABNORMAL LOW (ref 150–400)
RBC: 2.85 MIL/uL — ABNORMAL LOW (ref 4.22–5.81)
RDW: 14.4 % (ref 11.5–15.5)
WBC: 4 10*3/uL (ref 4.0–10.5)
nRBC: 0 % (ref 0.0–0.2)

## 2023-10-05 NOTE — Anesthesia Preprocedure Evaluation (Addendum)
 Anesthesia Evaluation  Patient identified by MRN, date of birth, ID band Patient awake    Reviewed: Allergy & Precautions, NPO status , Patient's Chart, lab work & pertinent test results  Airway Mallampati: II  TM Distance: >3 FB Neck ROM: Full    Dental  (+) Edentulous Upper, Edentulous Lower   Pulmonary neg pulmonary ROS, former smoker   Pulmonary exam normal        Cardiovascular hypertension, Pt. on medications and Pt. on home beta blockers + CAD, + CABG, + Peripheral Vascular Disease and +CHF  + Valvular Problems/Murmurs AS  Rhythm:Regular Rate:Normal  Echo 07/28/2021 1. Akinesis of the basal inferior wall with overall preserved LV  function; calcified aortic valve with very mild AS (mean gradient 9 mmHg).   2. Left ventricular ejection fraction, by estimation, is 55 to 60%. The  left ventricle has normal function. The left ventricle demonstrates  regional wall motion abnormalities (see scoring diagram/findings for  description). Left ventricular diastolic  parameters are consistent with Grade I diastolic dysfunction (impaired  relaxation).   3. Right ventricular systolic function is normal. The right ventricular  size is normal. There is normal pulmonary artery systolic pressure.   4. The mitral valve is normal in structure. Trivial mitral valve  regurgitation. No evidence of mitral stenosis.   5. The aortic valve is calcified. Aortic valve regurgitation is mild. No  aortic stenosis is present.   6. Aortic dilatation noted. There is borderline dilatation of the aortic  root, measuring 39 mm.   7. The inferior vena cava is normal in size with greater than 50%  respiratory variability, suggesting right atrial pressure of 3 mmHg.     Neuro/Psych CVA, Residual Symptoms  negative psych ROS   GI/Hepatic Neg liver ROS,GERD  ,,  Endo/Other  negative endocrine ROS    Renal/GU CRFRenal diseaseLab Results      Component                 Value               Date                      NA                       138                 10/04/2023                K                        4.9                 10/04/2023                CO2                      25                  10/04/2023                GLUCOSE                  77                  10/04/2023                BUN  31 (H)              10/04/2023                CREATININE               1.20                10/04/2023                CALCIUM                  9.3                 10/04/2023                GFR                      54.78 (L)           10/10/2022                EGFR                     42 (L)              12/09/2021                GFRNONAA                 59 (L)              10/04/2023            Bladder dysfunction  Bladder Ca    Musculoskeletal negative musculoskeletal ROS (+)    Abdominal Normal abdominal exam  (+)   Peds  Hematology  (+) Blood dyscrasia, anemia Lab Results      Component                Value               Date                      WBC                      4.0                 10/04/2023                HGB                      9.9 (L)             10/04/2023                HCT                      30.3 (L)            10/04/2023                MCV                      106.3 (H)           10/04/2023                PLT                      130 (L)  10/04/2023              Anesthesia Other Findings   Reproductive/Obstetrics                             Anesthesia Physical Anesthesia Plan  ASA: 3  Anesthesia Plan: General   Post-op Pain Management:    Induction: Intravenous  PONV Risk Score and Plan: 2 and Ondansetron, Dexamethasone and Treatment may vary due to age or medical condition  Airway Management Planned: Mask and Oral ETT  Additional Equipment: None  Intra-op Plan:   Post-operative Plan: Extubation in OR  Informed Consent: I have reviewed the patients  History and Physical, chart, labs and discussed the procedure including the risks, benefits and alternatives for the proposed anesthesia with the patient or authorized representative who has indicated his/her understanding and acceptance.     Dental advisory given  Plan Discussed with: CRNA  Anesthesia Plan Comments: (See PAT note 10/04/2023)       Anesthesia Quick Evaluation

## 2023-10-05 NOTE — Progress Notes (Signed)
 Anesthesia Chart Review   Case: [id number] Date/Time: 10/10/23 1345   Procedure: TURBT (TRANSURETHRAL RESECTION OF BLADDER TUMOR)   Anesthesia type: General   Pre-op diagnosis: BLADDER CANCER   Location: [facility] PROCEDURE [facility] / [facility]   Surgeons: Rene Paci, MD       DISCUSSION:89 y.o. former smoker with h/o HTN, CAD s/p CABG 07/2021, CHF, mild AS, PAD, CKD, bladder cancer scheduled for above procedure 10/10/23 with Dr. Cristal Deer Liliane Shi.   Pt seen by cardiology 09/03/23. Per OV note, "Mr. Ishibashi's perioperative risk of a major cardiac event is 6.6% according to the Revised Cardiac Risk Index (RCRI) which indicates high risk for perioperative CV complications.  However, his functional capacity is good at 4.31 METs according to the Duke Activity Status Index (DASI). Recommendations: According to ACC/AHA guidelines, no further cardiovascular testing needed.  The patient may proceed to surgery at acceptable risk.   Antiplatelet and/or Anticoagulation Recommendations: Aspirin can be held for 5-7 days prior to his surgery.  Please resume Aspirin post operatively when it is felt to be safe from a bleeding standpoint."  VS: BP (!) 147/68   Pulse (!) 53   Temp 36.4 C (Oral)   Resp 16   Ht 5\' 4"  (1.626 m)   Wt 57.6 kg   SpO2 100%   BMI 21.80 kg/m   PROVIDERS: Myrlene Broker, MD is PCP   Cardiologist:  Kristeen Miss, MD   LABS: Labs reviewed: Acceptable for surgery. (all labs ordered are listed, but only abnormal results are displayed)  Labs Reviewed  BASIC METABOLIC PANEL WITH GFR - Abnormal; Notable for the following components:      Result Value   BUN 31 (*)    GFR, Estimated 59 (*)    All other components within normal limits  CBC - Abnormal; Notable for the following components:   RBC 2.85 (*)    Hemoglobin 9.9 (*)    HCT 30.3 (*)    MCV 106.3 (*)    MCH 34.7 (*)    Platelets 130 (*)    All other components within normal limits     IMAGES: VAS US  Carotid 07/20/2023 Summary:  Right Carotid: Velocities in the right ICA are consistent with a 40-59%                 stenosis. Non-hemodynamically significant plaque <50% noted  in                the CCA.   Left Carotid: Velocities in the left ICA are consistent with a 1-39%  stenosis.               Non-hemodynamically significant plaque <50% noted in the  CCA.   EKG:   CV: Echo 07/28/2021 1. Akinesis of the basal inferior wall with overall preserved LV  function; calcified aortic valve with very mild AS (mean gradient 9 mmHg).   2. Left ventricular ejection fraction, by estimation, is 55 to 60%. The  left ventricle has normal function. The left ventricle demonstrates  regional wall motion abnormalities (see scoring diagram/findings for  description). Left ventricular diastolic  parameters are consistent with Grade I diastolic dysfunction (impaired  relaxation).   3. Right ventricular systolic function is normal. The right ventricular  size is normal. There is normal pulmonary artery systolic pressure.   4. The mitral valve is normal in structure. Trivial mitral valve  regurgitation. No evidence of mitral stenosis.   5. The aortic valve is calcified. Aortic valve  regurgitation is mild. No  aortic stenosis is present.   6. Aortic dilatation noted. There is borderline dilatation of the aortic  root, measuring 39 mm.   7. The inferior vena cava is normal in size with greater than 50%  respiratory variability, suggesting right atrial pressure of 3 mmHg.   Past Medical History:  Diagnosis Date   (HFpEF) heart failure with preserved ejection fraction (HCC)    Echo 1/23: Inferior AK, mild aortic stenosis (mean 9 mmHg, V-max 210.5 cm/s, DI 0.70), EF 55-60, GR 1 DD, normal RVSF, normal PASP, trivial MR, mild AI, borderline dilation of aortic root (39 mm)   Acid reflux    hiatal hernia   Anemia    Bladder cancer (HCC)    CAD (coronary artery disease)    S/p non-STEMI 1/23 >> s/p CABG    Carotid artery disease (HCC) 08/23/2021   Pre-CABG Dopplers 1/23:R ICA 40-59; L ICA 1-39 // Carotid US 07/20/2023: RICA 40-59; LICA 1-39   CHF (congestive heart failure) (HCC)    Chronic kidney disease (CKD)    Coronary artery disease involving native coronary artery of native heart without angina pectoris 05/16/2022   Inf NSTEMI s/p CABG in 07/2021 (RIMA-LAD, LIMA-OM1, S-OM2)   Diverticulitis    Erectile dysfunction 04/20/2015   Essential hypertension 04/20/2015   History of colon polyps    History of kidney stones    History of stroke    Noted on brain MRI 07/2021   Hyperlipidemia 05/28/2015   PAD (peripheral artery disease) (HCC)    Pre-CABG Dopplers 1/23: Right ABI 0.65; left ABI 0.82   Skin cancer    Stroke (HCC)    noted MRI 2023 pt. unaware    Past Surgical History:  Procedure Laterality Date   APPENDECTOMY     cataract surgery     CORONARY ARTERY BYPASS GRAFT N/A 08/01/2021   Procedure: CORONARY ARTERY BYPASS GRAFTING (CABG) X 3  ,ON PUMP, USING LEFT AND RIGHT INTERNAL MAMMARY ARTERIES, RIGHT AND LEFT ENDOSCOPIC GREATER SAPHENOUS VEIN CONDUITS;  Surgeon: Alleen Borne, MD;  Location: [facility] OR;  Service: Open Heart Surgery;  Laterality: N/A;   ENDOVEIN HARVEST OF GREATER SAPHENOUS VEIN Right 08/01/2021   Procedure: ENDOVEIN HARVEST OF GREATER SAPHENOUS VEIN;  Surgeon: Alleen Borne, MD;  Location: [facility] OR;  Service: Open Heart Surgery;  Laterality: Right;   LEFT HEART CATH AND CORONARY ANGIOGRAPHY N/A 07/27/2021   Procedure: LEFT HEART CATH AND CORONARY ANGIOGRAPHY;  Surgeon: Iran Ouch, MD;  Location: [facility] INVASIVE CV LAB;  Service: Cardiovascular;  Laterality: N/A;   SKIN CANCER EXCISION     TEE WITHOUT CARDIOVERSION N/A 08/01/2021   Procedure: TRANSESOPHAGEAL ECHOCARDIOGRAM (TEE);  Surgeon: Alleen Borne, MD;  Location[facility] OR;  Service: Open Heart Surgery;  Laterality: N/A;   TRANSURETHRAL RESECTION OF BLADDER TUMOR N/A 01/03/2023   Procedure: TRANSURETHRAL  RESECTION OF BLADDER TUMOR (TURBT);  Surgeon: Rene Paci, MD;  Location: [facility];  Service: Urology;  Laterality: N/A;  90 MINUTES   TRANSURETHRAL RESECTION OF PROSTATE      MEDICATIONS:  aspirin EC 81 MG tablet   metoprolol tartrate (LOPRESSOR) 25 MG tablet   nitroGLYCERIN (NITROSTAT) 0.4 MG SL tablet   rosuvastatin (CRESTOR) 20 MG tablet   No current facility-administered medications for this encounter.   Jodell Cipro Ward, PA-C [facility]Surgical Testing [phone]

## 2023-10-09 NOTE — H&P (Signed)
 Urology Preoperative H&P   Chief Complaint: bladder cancer   History of Present Illness: Danny [name] is a 89 y.o. male with a history of HG T1 UCC of the bladder, s/p TURBT on 01/03/23. Surveillance cystoscopy from 07/19/23 showed recurrence of multiple 2 cm papillary bladder tumors involving the posterior bladder wall and extending up to the bladder dome with features concerning for urothelial carcinoma.  He is here today for repeat TURBT.  He denies interval UTIs, dysuria or gross hematuria.   Past Medical History:  Diagnosis Date   (HFpEF) heart failure with preserved ejection fraction (HCC)    Echo 1/23: Inferior AK, mild aortic stenosis (mean 9 mmHg, V-max 210.5 cm/s, DI 0.70), EF 55-60, GR 1 DD, normal RVSF, normal PASP, trivial MR, mild AI, borderline dilation of aortic root (39 mm)   Acid reflux    hiatal hernia   Anemia    Bladder cancer (HCC)    CAD (coronary artery disease)    S/p non-STEMI 1/23 >> s/p CABG   Carotid artery disease (HCC) 08/23/2021   Pre-CABG Dopplers 1/23:R ICA 40-59; L ICA 1-39 // Carotid US 07/20/2023: RICA 40-59; LICA 1-39   CHF (congestive heart failure) (HCC)    Chronic kidney disease (CKD)    Coronary artery disease involving native coronary artery of native heart without angina pectoris 05/16/2022   Inf NSTEMI s/p CABG in 07/2021 (RIMA-LAD, LIMA-OM1, S-OM2)   Diverticulitis    Erectile dysfunction 04/20/2015   Essential hypertension 04/20/2015   History of colon polyps    History of kidney stones    History of stroke    Noted on brain MRI 07/2021   Hyperlipidemia 05/28/2015   PAD (peripheral artery disease) (HCC)    Pre-CABG Dopplers 1/23: Right ABI 0.65; left ABI 0.82   Skin cancer    Stroke (HCC)    noted MRI 2023 pt. unaware    Past Surgical History:  Procedure Laterality Date   APPENDECTOMY     cataract surgery     CORONARY ARTERY BYPASS GRAFT N/A 08/01/2021   Procedure: CORONARY ARTERY BYPASS GRAFTING (CABG) X 3  ,ON PUMP, USING LEFT AND  RIGHT INTERNAL MAMMARY ARTERIES, RIGHT AND LEFT ENDOSCOPIC GREATER SAPHENOUS VEIN CONDUITS;  Surgeon: Alleen Borne, MD;  Location: [facility] OR;  Service: Open Heart Surgery;  Laterality: N/A;   ENDOVEIN HARVEST OF GREATER SAPHENOUS VEIN Right 08/01/2021   Procedure: ENDOVEIN HARVEST OF GREATER SAPHENOUS VEIN;  Surgeon: Alleen Borne, MD;  Location: [facility] OR;  Service: Open Heart Surgery;  Laterality: Right;   LEFT HEART CATH AND CORONARY ANGIOGRAPHY N/A 07/27/2021   Procedure: LEFT HEART CATH AND CORONARY ANGIOGRAPHY;  Surgeon: Iran Ouch, MD;  Location: [facility] INVASIVE CV LAB;  Service: Cardiovascular;  Laterality: N/A;   SKIN CANCER EXCISION     TEE WITHOUT CARDIOVERSION N/A 08/01/2021   Procedure: TRANSESOPHAGEAL ECHOCARDIOGRAM (TEE);  Surgeon: Alleen Borne, MD;  Location: [facility];  Service: Open Heart Surgery;  Laterality: N/A;   TRANSURETHRAL RESECTION OF BLADDER TUMOR N/A 01/03/2023   Procedure: TRANSURETHRAL RESECTION OF BLADDER TUMOR (TURBT);  Surgeon: Rene Paci, MD;  Location: [facility]S;  Service: Urology;  Laterality: N/A;  90 MINUTES   TRANSURETHRAL RESECTION OF PROSTATE      Allergies:  Allergies  Allergen Reactions   Lipitor [Atorvastatin]     Muscle soreness    Family History  Problem Relation Age of Onset   Stroke Father 36   Sudden death Mother 48  unknown cause    Social History:  reports that he has quit smoking. His smoking use included cigars. He has never used smokeless tobacco. He reports current alcohol use. He reports that he does not use drugs.  ROS: A complete review of systems was performed.  All systems are negative except for pertinent findings as noted.  Physical Exam:  Vital signs in last 24 hours:   Constitutional:  Alert and oriented, No acute distress Cardiovascular: Regular rate and rhythm, No JVD Respiratory: Normal respiratory effort, Lungs clear bilaterally GI: Abdomen is soft, nontender, nondistended, no abdominal  masses GU: No CVA tenderness Lymphatic: No lymphadenopathy Neurologic: Grossly intact, no focal deficits Psychiatric: Normal mood and affect  Laboratory Data:  No results for input(s): "WBC", "HGB", "HCT", "PLT" in the last 72 hours.  No results for input(s): "NA", "K", "CL", "GLUCOSE", "BUN", "CALCIUM", "CREATININE" in the last 72 hours.  Invalid input(s): "CO3"   No results found for this or any previous visit (from the past 24 hours). No results found for this or any previous visit (from the past 240 hours).  Renal Function: Recent Labs    10/04/23 1148  CREATININE 1.20   Estimated Creatinine Clearance: 35.3 mL/min (by C-G formula based on SCr of 1.2 mg/dL).  Radiologic Imaging: No results found.  I independently reviewed the above imaging studies.  Assessment and Plan Danny [name] is a 89 y.o. male with a history of HG T1 UCC of the bladder, now with multiple bladder recurrences.    -The risks, benefits and alternatives of cystoscopy with TURBT and gemcitabine instillation was discussed with the patient. The risks include, but are not limited to, bleeding, urinary tract infection, bladder perforation requiring prolonged catheterization and/or open bladder repair, ureteral obstruction, voiding dysfunction and the inherent risks of general anesthesia. The patient voices understanding and wishes to proceed.   -He will need to keep his Foley catheter for 5 days after his surgery due to prolonged issues with urinary retention after his last operation.   Rhoderick Moody, MD 10/09/2023, 7:44 AM  [facility] Pager: [phone]

## 2023-10-10 ENCOUNTER — Ambulatory Visit (HOSPITAL_BASED_OUTPATIENT_CLINIC_OR_DEPARTMENT_OTHER): Admitting: Certified Registered Nurse Anesthetist

## 2023-10-10 ENCOUNTER — Encounter (HOSPITAL_COMMUNITY)
Admission: RE | Disposition: A | Discharge status: Home / Self Care | Payer: Self-pay | Source: Home / Self Care | Attending: Urology

## 2023-10-10 ENCOUNTER — Ambulatory Visit (HOSPITAL_COMMUNITY): Payer: Self-pay | Admitting: Physician Assistant

## 2023-10-10 ENCOUNTER — Encounter (HOSPITAL_COMMUNITY): Payer: Self-pay | Admitting: Urology

## 2023-10-10 ENCOUNTER — Other Ambulatory Visit: Payer: Self-pay

## 2023-10-10 ENCOUNTER — Observation Stay (HOSPITAL_COMMUNITY)
Admission: RE | Admit: 2023-10-10 | Discharge: 2023-10-11 | Disposition: A | Discharge status: Home / Self Care | Attending: Urology | Admitting: Urology

## 2023-10-10 DIAGNOSIS — D494 Neoplasm of unspecified behavior of bladder: Secondary | ICD-10-CM | POA: Diagnosis not present

## 2023-10-10 DIAGNOSIS — C679 Malignant neoplasm of bladder, unspecified: Secondary | ICD-10-CM | POA: Diagnosis not present

## 2023-10-10 DIAGNOSIS — Z85828 Personal history of other malignant neoplasm of skin: Secondary | ICD-10-CM | POA: Insufficient documentation

## 2023-10-10 DIAGNOSIS — R339 Retention of urine, unspecified: Secondary | ICD-10-CM | POA: Insufficient documentation

## 2023-10-10 DIAGNOSIS — N189 Chronic kidney disease, unspecified: Secondary | ICD-10-CM | POA: Diagnosis not present

## 2023-10-10 DIAGNOSIS — I509 Heart failure, unspecified: Secondary | ICD-10-CM

## 2023-10-10 DIAGNOSIS — Z8551 Personal history of malignant neoplasm of bladder: Secondary | ICD-10-CM | POA: Diagnosis not present

## 2023-10-10 DIAGNOSIS — R001 Bradycardia, unspecified: Secondary | ICD-10-CM | POA: Diagnosis not present

## 2023-10-10 DIAGNOSIS — Z8673 Personal history of transient ischemic attack (TIA), and cerebral infarction without residual deficits: Secondary | ICD-10-CM | POA: Diagnosis not present

## 2023-10-10 DIAGNOSIS — I110 Hypertensive heart disease with heart failure: Secondary | ICD-10-CM

## 2023-10-10 DIAGNOSIS — N183 Chronic kidney disease, stage 3 unspecified: Secondary | ICD-10-CM | POA: Diagnosis not present

## 2023-10-10 DIAGNOSIS — I639 Cerebral infarction, unspecified: Secondary | ICD-10-CM | POA: Diagnosis not present

## 2023-10-10 DIAGNOSIS — Z87891 Personal history of nicotine dependence: Secondary | ICD-10-CM | POA: Insufficient documentation

## 2023-10-10 DIAGNOSIS — I130 Hypertensive heart and chronic kidney disease with heart failure and stage 1 through stage 4 chronic kidney disease, or unspecified chronic kidney disease: Secondary | ICD-10-CM | POA: Insufficient documentation

## 2023-10-10 DIAGNOSIS — C674 Malignant neoplasm of posterior wall of bladder: Secondary | ICD-10-CM | POA: Diagnosis not present

## 2023-10-10 DIAGNOSIS — I5030 Unspecified diastolic (congestive) heart failure: Secondary | ICD-10-CM | POA: Diagnosis not present

## 2023-10-10 DIAGNOSIS — I2510 Atherosclerotic heart disease of native coronary artery without angina pectoris: Secondary | ICD-10-CM | POA: Diagnosis not present

## 2023-10-10 DIAGNOSIS — E785 Hyperlipidemia, unspecified: Secondary | ICD-10-CM | POA: Insufficient documentation

## 2023-10-10 HISTORY — PX: TRANSURETHRAL RESECTION OF BLADDER TUMOR: SHX2575

## 2023-10-10 SURGERY — TURBT (TRANSURETHRAL RESECTION OF BLADDER TUMOR)
Anesthesia: General

## 2023-10-10 MED ORDER — ORAL CARE MOUTH RINSE: 15.0000 mL | Freq: Once | OROMUCOSAL | Status: AC

## 2023-10-10 MED ORDER — PROPOFOL 10 MG/ML IV BOLUS
INTRAVENOUS | Status: DC | PRN
  Administered 2023-10-10: 70 mg via INTRAVENOUS

## 2023-10-10 MED ORDER — FENTANYL CITRATE (PF) 100 MCG/2ML IJ SOLN
INTRAMUSCULAR | Status: AC
  Filled 2023-10-10: qty 2

## 2023-10-10 MED ORDER — ONDANSETRON HCL 4 MG/2ML IJ SOLN
INTRAMUSCULAR | Status: DC | PRN
Start: 2023-10-10 — End: 2023-10-10
  Administered 2023-10-10: 4 mg via INTRAVENOUS

## 2023-10-10 MED ORDER — FENTANYL CITRATE (PF) 100 MCG/2ML IJ SOLN
INTRAMUSCULAR | Status: DC | PRN
  Administered 2023-10-10: 25 ug via INTRAVENOUS

## 2023-10-10 MED ORDER — 0.9 % SODIUM CHLORIDE (POUR BTL) OPTIME
TOPICAL | Status: DC | PRN
  Administered 2023-10-10: 1000 mL

## 2023-10-10 MED ORDER — LIDOCAINE HCL (CARDIAC) PF 100 MG/5ML IV SOSY
PREFILLED_SYRINGE | INTRAVENOUS | Status: DC | PRN
  Administered 2023-10-10: 40 mg via INTRAVENOUS

## 2023-10-10 MED ORDER — SUGAMMADEX SODIUM 200 MG/2ML IV SOLN
INTRAVENOUS | Status: AC
  Filled 2023-10-10: qty 2

## 2023-10-10 MED ORDER — PHENYLEPHRINE HCL-NACL 20-0.9 MG/250ML-% IV SOLN
INTRAVENOUS | Status: AC
  Filled 2023-10-10: qty 250

## 2023-10-10 MED ORDER — ROCURONIUM BROMIDE 10 MG/ML (PF) SYRINGE
PREFILLED_SYRINGE | INTRAVENOUS | Status: AC
  Filled 2023-10-10: qty 10

## 2023-10-10 MED ORDER — STERILE WATER FOR IRRIGATION IR SOLN
Status: DC | PRN
  Administered 2023-10-10: 500 mL

## 2023-10-10 MED ORDER — ONDANSETRON HCL 4 MG/2ML IJ SOLN
INTRAMUSCULAR | Status: AC
  Filled 2023-10-10: qty 2

## 2023-10-10 MED ORDER — CEFAZOLIN SODIUM-DEXTROSE 2-4 GM/100ML-% IV SOLN
2.0000 g | INTRAVENOUS | Status: AC
  Administered 2023-10-10: 2 g via INTRAVENOUS
  Filled 2023-10-10: qty 100

## 2023-10-10 MED ORDER — DIPHENHYDRAMINE HCL 50 MG/ML IJ SOLN: 12.5000 mg | Freq: Four times a day (QID) | INTRAMUSCULAR | Status: DC | PRN

## 2023-10-10 MED ORDER — OXYBUTYNIN CHLORIDE 5 MG PO TABS: 5.0000 mg | ORAL_TABLET | Freq: Three times a day (TID) | ORAL | Status: DC | PRN

## 2023-10-10 MED ORDER — ROSUVASTATIN CALCIUM 20 MG PO TABS
20.0000 mg | ORAL_TABLET | Freq: Every day | ORAL | Status: DC
  Administered 2023-10-10: 20 mg via ORAL
  Filled 2023-10-10: qty 1

## 2023-10-10 MED ORDER — LIDOCAINE HCL (PF) 2 % IJ SOLN
INTRAMUSCULAR | Status: AC
  Filled 2023-10-10: qty 5

## 2023-10-10 MED ORDER — NITROGLYCERIN 0.4 MG SL SUBL: 0.4000 mg | SUBLINGUAL_TABLET | SUBLINGUAL | Status: DC | PRN

## 2023-10-10 MED ORDER — DIPHENHYDRAMINE HCL 12.5 MG/5ML PO ELIX: 12.5000 mg | ORAL_SOLUTION | Freq: Four times a day (QID) | ORAL | Status: DC | PRN

## 2023-10-10 MED ORDER — ROCURONIUM BROMIDE 10 MG/ML (PF) SYRINGE
PREFILLED_SYRINGE | INTRAVENOUS | Status: DC | PRN
  Administered 2023-10-10: 40 mg via INTRAVENOUS

## 2023-10-10 MED ORDER — OXYBUTYNIN CHLORIDE 5 MG PO TABS: 5.0000 mg | ORAL_TABLET | Freq: Three times a day (TID) | ORAL | 1 refills | Status: DC | PRN

## 2023-10-10 MED ORDER — SUGAMMADEX SODIUM 200 MG/2ML IV SOLN
INTRAVENOUS | Status: DC | PRN
  Administered 2023-10-10: 150 mg via INTRAVENOUS

## 2023-10-10 MED ORDER — ACETAMINOPHEN 325 MG PO TABS: 650.0000 mg | ORAL_TABLET | ORAL | Status: DC | PRN

## 2023-10-10 MED ORDER — ONDANSETRON HCL 4 MG/2ML IJ SOLN: 4.0000 mg | INTRAMUSCULAR | Status: DC | PRN

## 2023-10-10 MED ORDER — FENTANYL CITRATE PF 50 MCG/ML IJ SOSY: 25.0000 ug | PREFILLED_SYRINGE | INTRAMUSCULAR | Status: DC | PRN

## 2023-10-10 MED ORDER — PHENAZOPYRIDINE HCL 100 MG PO TABS: 100.0000 mg | ORAL_TABLET | Freq: Three times a day (TID) | ORAL | 1 refills | Status: DC | PRN

## 2023-10-10 MED ORDER — HYDROMORPHONE HCL 1 MG/ML IJ SOLN: 0.5000 mg | INTRAMUSCULAR | Status: DC | PRN

## 2023-10-10 MED ORDER — SENNOSIDES-DOCUSATE SODIUM 8.6-50 MG PO TABS: 1.0000 | ORAL_TABLET | Freq: Every evening | ORAL | Status: DC | PRN

## 2023-10-10 MED ORDER — CHLORHEXIDINE GLUCONATE 0.12 % MT SOLN
15.0000 mL | Freq: Once | OROMUCOSAL | Status: AC
  Administered 2023-10-10: 15 mL via OROMUCOSAL

## 2023-10-10 MED ORDER — OXYCODONE-ACETAMINOPHEN 5-325 MG PO TABS: 1.0000 | ORAL_TABLET | ORAL | Status: DC | PRN

## 2023-10-10 MED ORDER — DEXAMETHASONE SODIUM PHOSPHATE 10 MG/ML IJ SOLN
INTRAMUSCULAR | Status: DC | PRN
Start: 2023-10-10 — End: 2023-10-10
  Administered 2023-10-10: 4 mg via INTRAVENOUS

## 2023-10-10 MED ORDER — DEXAMETHASONE SODIUM PHOSPHATE 10 MG/ML IJ SOLN
INTRAMUSCULAR | Status: AC
  Filled 2023-10-10: qty 1

## 2023-10-10 MED ORDER — PHENYLEPHRINE HCL-NACL 20-0.9 MG/250ML-% IV SOLN
INTRAVENOUS | Status: DC | PRN
  Administered 2023-10-10: 40 ug/min via INTRAVENOUS

## 2023-10-10 MED ORDER — LACTATED RINGERS IV SOLN: INTRAVENOUS | Status: DC | PRN

## 2023-10-10 MED ORDER — SODIUM CHLORIDE 0.9 % IR SOLN
Status: DC | PRN
  Administered 2023-10-10: 15000 mL

## 2023-10-10 SURGICAL SUPPLY — 15 items
BAG URINE DRAIN 2000ML AR STRL (UROLOGICAL SUPPLIES) IMPLANT
BAG URO CATCHER STRL LF (MISCELLANEOUS) ×1 IMPLANT
CATH FOLEY 2WAY SLVR 5CC 18FR (CATHETERS) IMPLANT
DRAPE FOOT SWITCH (DRAPES) ×1 IMPLANT
ELECT REM PT RETURN 15FT ADLT (MISCELLANEOUS) ×1 IMPLANT
GLOVE SURG LX STRL 8.0 MICRO (GLOVE) ×1 IMPLANT
GOWN STRL SURGICAL XL XLNG (GOWN DISPOSABLE) ×1 IMPLANT
KIT TURNOVER KIT A (KITS) IMPLANT
LOOP CUT BIPOLAR 24F LRG (ELECTROSURGICAL) IMPLANT
MANIFOLD NEPTUNE II (INSTRUMENTS) ×1 IMPLANT
PACK CYSTO (CUSTOM PROCEDURE TRAY) ×1 IMPLANT
SYR TOOMEY IRRIG 70ML (MISCELLANEOUS) ×1 IMPLANT
SYRINGE TOOMEY IRRIG 70ML (MISCELLANEOUS) IMPLANT
TUBING CONNECTING 10 (TUBING) ×1 IMPLANT
TUBING UROLOGY SET (TUBING) ×1 IMPLANT

## 2023-10-10 NOTE — Consult Note (Signed)
 Medical Consultation   ABRON NEDDO  ZOX:[id number]  DOB: Dec 13, 1933  DOA: 10/10/2023  PCP: Myrlene Broker, MD    Reason for consultation: Med management   History of Present Illness: Danny [name] is an 89 y.o. male with history of heart failure with preserved ejection fraction, GERD, CAD, CHF, CKD, hyperlipidemia bladder cancer who presented today for repeat TURBT.  Patient tolerated the procedure well without complication.  He was noticed to have heart rates in the 50s postoperatively.  Hospitalist were consulted for medication management.  Review of EKG shows sinus bradycardia.  Patient denies any nausea dizziness and is otherwise asymptomatic.  He last had an echocardiogram January 2023 which showed akinesis of basal inferior wall with preserved ejection fraction.  Review of prior EKGs dating back to February of this year showed similar sinus bradycardia.  There is no evidence of heart block on these EKGs.     Review of Systems:   As per HPI otherwise 10 point review of systems negative.   Review of Systems  Constitutional: Negative.  Negative for activity change, appetite change and chills.  HENT: Negative.    Eyes: Negative.   Respiratory: Negative.    Cardiovascular: Negative.   Gastrointestinal: Negative.   Endocrine: Negative.   Genitourinary: Negative.   Musculoskeletal: Negative.   Neurological: Negative.   Hematological: Negative.   Psychiatric/Behavioral: Negative.    All other systems reviewed and are negative.  Past Medical History: Past Medical History:  Diagnosis Date   (HFpEF) heart failure with preserved ejection fraction (HCC)    Echo 1/23: Inferior AK, mild aortic stenosis (mean 9 mmHg, V-max 210.5 cm/s, DI 0.70), EF 55-60, GR 1 DD, normal RVSF, normal PASP, trivial MR, mild AI, borderline dilation of aortic root (39 mm)   Acid reflux    hiatal hernia   Anemia    Bladder cancer (HCC)    CAD (coronary artery disease)    S/p  non-STEMI 1/23 >> s/p CABG   Carotid artery disease (HCC) 08/23/2021   Pre-CABG Dopplers 1/23:R ICA 40-59; L ICA 1-39 // Carotid US 07/20/2023: RICA 40-59; LICA 1-39   CHF (congestive heart failure) (HCC)    Chronic kidney disease (CKD)    Coronary artery disease involving native coronary artery of native heart without angina pectoris 05/16/2022   Inf NSTEMI s/p CABG in 07/2021 (RIMA-LAD, LIMA-OM1, S-OM2)   Diverticulitis    Erectile dysfunction 04/20/2015   Essential hypertension 04/20/2015   History of colon polyps    History of kidney stones    History of stroke    Noted on brain MRI 07/2021   Hyperlipidemia 05/28/2015   PAD (peripheral artery disease) (HCC)    Pre-CABG Dopplers 1/23: Right ABI 0.65; left ABI 0.82   Skin cancer    Stroke (HCC)    noted MRI 2023 pt. unaware    Past Surgical History: Past Surgical History:  Procedure Laterality Date   APPENDECTOMY     cataract surgery     CORONARY ARTERY BYPASS GRAFT N/A 08/01/2021   Procedure: CORONARY ARTERY BYPASS GRAFTING (CABG) X 3  ,ON PUMP, USING LEFT AND RIGHT INTERNAL MAMMARY ARTERIES, RIGHT AND LEFT ENDOSCOPIC GREATER SAPHENOUS VEIN CONDUITS;  Surgeon: Alleen Borne, MD;  Location: [facility] OR;  Service: Open Heart Surgery;  Laterality: N/A;   ENDOVEIN HARVEST OF GREATER SAPHENOUS VEIN Right 08/01/2021   Procedure: ENDOVEIN HARVEST OF GREATER SAPHENOUS VEIN;  Surgeon:  Alleen Borne, MD;  Location: [facility] OR;  Service: Open Heart Surgery;  Laterality: Right;   LEFT HEART CATH AND CORONARY ANGIOGRAPHY N/A 07/27/2021   Procedure: LEFT HEART CATH AND CORONARY ANGIOGRAPHY;  Surgeon: Iran Ouch, MD;  Location: [facility] INVASIVE CV LAB;  Service: Cardiovascular;  Laterality: N/A;   SKIN CANCER EXCISION     TEE WITHOUT CARDIOVERSION N/A 08/01/2021   Procedure: TRANSESOPHAGEAL ECHOCARDIOGRAM (TEE);  Surgeon: Alleen Borne, MD;  Location[facility] OR;  Service: Open Heart Surgery;  Laterality: N/A;   TRANSURETHRAL RESECTION OF BLADDER TUMOR  N/A 01/03/2023   Procedure: TRANSURETHRAL RESECTION OF BLADDER TUMOR (TURBT);  Surgeon: Rene Paci, MD;  Location: [facility] ORS;  Service: Urology;  Laterality: N/A;  90 MINUTES   TRANSURETHRAL RESECTION OF PROSTATE       Allergies:   Allergies  Allergen Reactions   Lipitor [Atorvastatin]     Muscle soreness     Social History:  reports that he has quit smoking. His smoking use included cigars. He has never used smokeless tobacco. He reports current alcohol use. He reports that he does not use drugs.   Family History: Family History  Problem Relation Age of Onset   Stroke Father 54   Sudden death Mother 50       unknown cause   Physical Exam: Vitals:   10/10/23 1745 10/10/23 1800 10/10/23 1813 10/10/23 2114  BP: (!) 136/54  (!) 133/53 (!) 128/56  Pulse:  (!) 36 (!) 44 (!) 51  Resp: 12 13 20 15   Temp: 97.6 F (36.4 C)  (!) 97.5 F (36.4 C) 97.7 F (36.5 C)  TempSrc:   Oral Oral  SpO2: 96% 96% 100% 100%  Weight:      Height:        Constitutional: Appearance,  Alert and awake, oriented x3, not in any acute distress. Eyes: PERLA, EOMI, irises appear normal, anicteric sclera,  ENMT: external ears             Lips appears normal Neck: neck appears normal, no masses, normal ROM, no thyromegaly, no JVD  CVS: S1-S2 clear,  Respiratory: , no wheezing, rales or rhonchi. Respiratory effort normal. No accessory muscle use.  Abdomen: soft nontender, nondistended, normal bowel sounds, no hepatosplenomegaly, no hernias  Musculoskeletal: : no cyanosis, clubbing or edema noted bilaterally Neuro: Cranial nerves II-XII intact, strength, sensation, reflexes Psych: judgement and insight appear normal, stable mood and affect, mental status Skin: no rashes or lesions or ulcers, no induration or nodules   Data reviewed:  I have personally reviewed following labs and imaging studies Labs:  CBC: Recent Labs  Lab 10/04/23 1148  WBC 4.0  HGB 9.9*  HCT 30.3*  MCV 106.3*   PLT 130*    Basic Metabolic Panel: Recent Labs  Lab 10/04/23 1148  NA 138  K 4.9  CL 107  CO2 25  GLUCOSE 77  BUN 31*  CREATININE 1.20  CALCIUM 9.3    Urinalysis    Component Value Date/Time   COLORURINE YELLOW 07/27/2021 0436   APPEARANCEUR CLEAR 07/27/2021 0436   LABSPEC 1.010 07/27/2021 0436   PHURINE 6.5 07/27/2021 0436   GLUCOSEU NEGATIVE 07/27/2021 0436   HGBUR TRACE (A) 07/27/2021 0436   BILIRUBINUR large (A) 10/20/2022 1608   BILIRUBINUR negative 01/19/2022 0924   KETONESUR small (15) (A) 10/20/2022 1608   KETONESUR NEGATIVE 07/27/2021 0436   PROTEINUR >=300 (A) 10/20/2022 1608   PROTEINUR Positive (A) 01/19/2022 0924   PROTEINUR NEGATIVE 07/27/2021  0436   UROBILINOGEN 4.0 (A) 10/20/2022 1608   UROBILINOGEN 0.2 11/28/2013 2032   NITRITE Positive (A) 10/20/2022 1608   NITRITE negative 01/19/2022 0924   NITRITE NEGATIVE 07/27/2021 0436   LEUKOCYTESUR Large (3+) (A) 10/20/2022 1608   LEUKOCYTESUR NEGATIVE 07/27/2021 0436     Sepsis Labs Recent Labs  Lab 10/04/23 1148  WBC 4.0   Microbiology No results found for this or any previous visit (from the past 240 hours).     Inpatient Medications:   Scheduled Meds:  rosuvastatin  20 mg Oral QHS   Continuous Infusions:   Radiological Exams on Admission: No results found.  Impression/Recommendations Principal Problem:   Bradycardia with 31-40 beats per minute Active Problems:   Bradycardia  # Sinus bradycardia Patient presented today for urologic procedure was found to be sinus bradycardia.  Patient remains asymptomatic through these bradycardic episodes.  At this time it appears that he takes metoprolol 25 mg twice daily.  This is likely contributing to bradycardia.  Would recommend holding beta-blockade at this time and monitoring of telemetry overnight.  Would recommend assessment of telemetry in the morning to assess whether patient has any evidence of heart block at which time he would need  cardiology consultation for possible pacemaker.  # Urinary retention-continue oxybutynin  # Hyperlipidemia-continue rosuvastatin  # History of heart failure preserved ejection fraction-patient's currently not in the heart for exacerbation well compensated  # Chronic anemia-trend hemoglobin  # Thrombocytopenia-trend CBC   Thank you for this consultation.  Our [facility] hospitalist team will follow the patient with you.   Time Spent: 60 min Alan Mulder M.D. [facility] 10/10/2023, 9:31 PM

## 2023-10-10 NOTE — Anesthesia Procedure Notes (Signed)
 Procedure Name: Intubation Date/Time: 10/10/2023 1:42 PM  Performed by: Cleda Clarks, CRNAPre-anesthesia Checklist: Patient identified, Emergency Drugs available, Suction available and Patient being monitored Patient Re-evaluated:Patient Re-evaluated prior to induction Oxygen Delivery Method: Circle system utilized Preoxygenation: Pre-oxygenation with 100% oxygen Induction Type: IV induction Ventilation: Mask ventilation without difficulty Laryngoscope Size: Miller and 2 Grade View: Grade II Tube type: Oral Tube size: 7.0 mm Number of attempts: 1 Airway Equipment and Method: Stylet and Oral airway Placement Confirmation: ETT inserted through vocal cords under direct vision, positive ETCO2 and breath sounds checked- equal and bilateral Secured at: 20 cm Tube secured with: Tape Dental Injury: Teeth and Oropharynx as per pre-operative assessment

## 2023-10-10 NOTE — Transfer of Care (Signed)
 Immediate Anesthesia Transfer of Care Note  Patient: Danny [name]  Procedure(s) Performed: TURBT (TRANSURETHRAL RESECTION OF BLADDER TUMOR)  Patient Location: PACU  Anesthesia Type:General  Level of Consciousness: awake, alert , and oriented  Airway & Oxygen Therapy: Patient Spontanous Breathing and Patient connected to face mask oxygen  Post-op Assessment: Report given to RN and Post -op Vital signs reviewed and stable  Post vital signs: Reviewed and stable  Last Vitals:  Vitals Value Taken Time  BP 148/55 10/10/23 1446  Temp    Pulse 32 10/10/23 1448  Resp 17 10/10/23 1449  SpO2 100 % 10/10/23 1448  Vitals shown include unfiled device data.  Last Pain:  Vitals:   10/10/23 1151  TempSrc: Oral         Complications: No notable events documented.

## 2023-10-10 NOTE — Plan of Care (Signed)

## 2023-10-10 NOTE — Op Note (Addendum)
 Operative Note  Preoperative diagnosis:  1.  Papillary bladder tumor recurrence 2.  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  5 cm papillary bladder tumor recurrence extensively involving the posterior bladder wall and bladder dome 2.  History of high-grade T1 urothelial carcinoma the bladder  Procedure(s): 1.  Cystoscopy with TURBT (large)  Surgeon: Rhoderick Moody, MD  Assistants:  None  Anesthesia:  General  Complications:  None  EBL: 10 mL  Specimens: 1.  Posterior and bladder dome tumors  Drains/Catheters: 1.  18 French Foley catheter with 10 mL sterile water in the balloon  Intraoperative findings:   Contiguous 5 cm papillary bladder tumor involving the posterior bladder wall and bladder dome No other intravesical or urethral abnormalities were seen No gross evidence of bladder perforation intraoperatively  Indication:  Danny [name] is a 89 y.o. 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  He underwent a surveillance cystoscopy in January and was found to have multiple bladder tumor recurrences involving the posterior bladder wall.  He is here today for repeat TURBT.  He has been consented for the above procedures, voices understanding and wishes to proceed.  Description of procedure:  After informed consent was obtained, the patient was brought to the operating room and general endotracheal anesthesia was administered. The patient was then placed in the dorsolithotomy position and prepped and draped in the usual sterile fashion. A timeout was performed. A 23 French rigid cystoscope was then inserted into the urethral meatus and advanced into the bladder under direct vision. A complete bladder survey revealed the findings listed above.  The rigid cystoscope was then exchanged for a 26 French resectoscope with a bipolar loop working element.  His large posterior bladder wall/bladder dome  tumor was then systematically resected down to the detrusor musculature.  All tumor specimen was then evacuated from the lumen of the bladder through the sheath of the resectoscope and sent for permanent section.  The area of resection was then extensively fulgurated until hemostasis was achieved.  Visual inspection of the resection area showed no evidence of bladder perforation.  Due to issues with urinary retention following his last TURBT, I placed an 39 French Foley catheter that we will stay in place until his follow-up appointment on 10/15/2023.  The patient tolerated the procedure well and was transferred to the postanesthesia in stable condition.  Plan: Discharge home with Foley catheter

## 2023-10-11 ENCOUNTER — Telehealth: Payer: Self-pay

## 2023-10-11 ENCOUNTER — Encounter (HOSPITAL_COMMUNITY): Payer: Self-pay | Admitting: Urology

## 2023-10-11 ENCOUNTER — Other Ambulatory Visit: Payer: Self-pay | Admitting: Student

## 2023-10-11 DIAGNOSIS — C679 Malignant neoplasm of bladder, unspecified: Secondary | ICD-10-CM | POA: Diagnosis not present

## 2023-10-11 DIAGNOSIS — I25719 Atherosclerosis of autologous vein coronary artery bypass graft(s) with unspecified angina pectoris: Secondary | ICD-10-CM

## 2023-10-11 DIAGNOSIS — I10 Essential (primary) hypertension: Secondary | ICD-10-CM

## 2023-10-11 DIAGNOSIS — R011 Cardiac murmur, unspecified: Secondary | ICD-10-CM | POA: Diagnosis not present

## 2023-10-11 DIAGNOSIS — I5030 Unspecified diastolic (congestive) heart failure: Secondary | ICD-10-CM | POA: Diagnosis not present

## 2023-10-11 DIAGNOSIS — D494 Neoplasm of unspecified behavior of bladder: Secondary | ICD-10-CM | POA: Diagnosis not present

## 2023-10-11 DIAGNOSIS — N189 Chronic kidney disease, unspecified: Secondary | ICD-10-CM | POA: Diagnosis not present

## 2023-10-11 DIAGNOSIS — R339 Retention of urine, unspecified: Secondary | ICD-10-CM | POA: Diagnosis not present

## 2023-10-11 DIAGNOSIS — I2510 Atherosclerotic heart disease of native coronary artery without angina pectoris: Secondary | ICD-10-CM | POA: Diagnosis not present

## 2023-10-11 DIAGNOSIS — E785 Hyperlipidemia, unspecified: Secondary | ICD-10-CM | POA: Diagnosis not present

## 2023-10-11 DIAGNOSIS — R001 Bradycardia, unspecified: Secondary | ICD-10-CM

## 2023-10-11 DIAGNOSIS — Z85828 Personal history of other malignant neoplasm of skin: Secondary | ICD-10-CM | POA: Diagnosis not present

## 2023-10-11 DIAGNOSIS — I5032 Chronic diastolic (congestive) heart failure: Secondary | ICD-10-CM

## 2023-10-11 DIAGNOSIS — Z8673 Personal history of transient ischemic attack (TIA), and cerebral infarction without residual deficits: Secondary | ICD-10-CM | POA: Diagnosis not present

## 2023-10-11 DIAGNOSIS — Z8551 Personal history of malignant neoplasm of bladder: Secondary | ICD-10-CM | POA: Diagnosis not present

## 2023-10-11 DIAGNOSIS — I359 Nonrheumatic aortic valve disorder, unspecified: Secondary | ICD-10-CM

## 2023-10-11 LAB — SURGICAL PATHOLOGY

## 2023-10-11 LAB — BASIC METABOLIC PANEL WITH GFR
Anion gap: 7 (ref 5–15)
BUN: 34 mg/dL — ABNORMAL HIGH (ref 8–23)
CO2: 22 mmol/L (ref 22–32)
Calcium: 8.4 mg/dL — ABNORMAL LOW (ref 8.9–10.3)
Chloride: 108 mmol/L (ref 98–111)
Creatinine, Ser: 1.21 mg/dL (ref 0.61–1.24)
GFR, Estimated: 58 mL/min — ABNORMAL LOW (ref >60)
Glucose, Bld: 91 mg/dL (ref 70–99)
Potassium: 4.8 mmol/L (ref 3.5–5.1)
Sodium: 137 mmol/L (ref 135–145)

## 2023-10-11 LAB — CBC
HCT: 25 % — ABNORMAL LOW (ref 39.0–52.0)
Hemoglobin: 8.3 g/dL — ABNORMAL LOW (ref 13.0–17.0)
MCH: 34.9 pg — ABNORMAL HIGH (ref 26.0–34.0)
MCHC: 33.2 g/dL (ref 30.0–36.0)
MCV: 105 fL — ABNORMAL HIGH (ref 80.0–100.0)
Platelets: 106 10*3/uL — ABNORMAL LOW (ref 150–400)
RBC: 2.38 MIL/uL — ABNORMAL LOW (ref 4.22–5.81)
RDW: 14.3 % (ref 11.5–15.5)
WBC: 4.6 10*3/uL (ref 4.0–10.5)
nRBC: 0 % (ref 0.0–0.2)

## 2023-10-11 NOTE — Progress Notes (Signed)
 PROGRESS NOTE  Severin Bou Morine  WUJ:[id number] DOB: 04-Aug-1933 DOA: 10/10/2023 PCP: Myrlene Broker, MD   Brief Narrative: Patient is a 89 year old male with history of HFpEF, GERD, coronary artery disease, CKD, hyperlipidemia, bladder cancer who presented for repeat TURBT and underwent the procedure well without complication was admitted for the evaluation of bradycardia.  Postoperatively, he became bradycardic.  EKG shows sinus bradycardia.  No dizziness or symptoms at rest.  Has chronic sinus bradycardia as per the report.  No evidence of heart blocks as per EKGs.  Cardiology consulted  Assessment & Plan:  Principal Problem:   Bradycardia with 31-40 beats per minute Active Problems:   Bradycardia   Sinus bradycardia: Remains asymptomatic at rest.  Takes metoprolol 25 mg twice daily, currently on hold.  Heart rate still in the range of 40 this morning.  EKG done on 4/2 showed sinus bradycardia without evidence of high-grade blocks.   cardiology consultation done  today.  Recurrent papillary bladder tumor: History of high-grade T1 urothelial carcinoma the bladder.  Underwent cystoscopy with TURBT on 4/2.  Management as per urology  History of hyperlipidemia: On statin  History of HFpEF: Currently appears euvolemic  Chronic normocytic anemia/thrombocytopenia: Currently hemoglobin/platelets level stable.  Will transfuse if hemoglobin drops less than 7.  Continue to monitor  History of urinary retention: On oxybutynin       DVT prophylaxis:SCDs Start: 10/10/23 1817     Code Status: Full Code  Family Communication: None at bedside  Patient status:Obs  Patient is from :Home  Anticipated discharge NF:AOZH  Estimated DC date: After full workup for bradycardia   Consultants: Urology, cardiology  Procedures: TURBT  Antimicrobials:  Anti-infectives (From admission, onward)    Start     Dose/Rate Route Frequency Ordered Stop   10/10/23 1143  ceFAZolin (ANCEF) IVPB  2g/100 mL premix        2 g 200 mL/hr over 30 Minutes Intravenous 30 min pre-op 10/10/23 1143 10/10/23 1345       Subjective: Patient seen and examined at the bedside today.  Comfortably sitting on the bed.  Eating his breakfast.  Denies any lightheadedness or dizziness.  Still remains bradycardic with heart rate in the range of 40s.  Objective: Vitals:   10/10/23 2114 10/11/23 0200 10/11/23 0300 10/11/23 0548  BP: (!) 128/56 (!) 113/48  (!) 113/52  Pulse: (!) 51 (!) 50  (!) 43  Resp: 15  13 14   Temp: 97.7 F (36.5 C) 98 F (36.7 C)  97.6 F (36.4 C)  TempSrc: Oral Oral  Oral  SpO2: 100% 100%  100%  Weight:      Height:        Intake/Output Summary (Last 24 hours) at 10/11/2023 0865 Last data filed at 10/11/2023 0554 Gross per 24 hour  Intake 640 ml  Output 625 ml  Net 15 ml   Filed Weights   10/10/23 1146  Weight: 57.6 kg    Examination:  General exam: Overall comfortable, not in distress, pleasant elderly male HEENT: PERRL Respiratory system:  no wheezes or crackles  Cardiovascular system: Sinus bradycardia.  Gastrointestinal system: Abdomen is nondistended, soft and nontender. Central nervous system: Alert and oriented Extremities: No edema, no clubbing ,no cyanosis Skin: No rashes, no ulcers,no icterus     Data Reviewed: I have personally reviewed following labs and imaging studies  CBC: Recent Labs  Lab 10/04/23 1148 10/11/23 0508  WBC 4.0 4.6  HGB 9.9* 8.3*  HCT 30.3* 25.0*  MCV 106.3* 105.0*  PLT 130* 106*   Basic Metabolic Panel: Recent Labs  Lab 10/04/23 1148 10/11/23 0508  NA 138 137  K 4.9 4.8  CL 107 108  CO2 25 22  GLUCOSE 77 91  BUN 31* 34*  CREATININE 1.20 1.21  CALCIUM 9.3 8.4*     No results found for this or any previous visit (from the past 240 hours).   Radiology Studies: No results found.  Scheduled Meds:  rosuvastatin  20 mg Oral QHS   Continuous Infusions:   LOS: 0 days   Burnadette Pop, MD [facility] P4/09/2023, 8:08 AM

## 2023-10-11 NOTE — TOC Transition Note (Addendum)
 Transition of Care [facility]) - Discharge Note   Patient Details  Name: ARIC JOST MRN: [id number] Date of Birth: Dec 03, 1933  [facility] CM/SW Contact:  Lanier Clam, RN Phone Number: 10/11/2023, 9:24 AM   Clinical Narrative: d/c home. No CM needs.   -Will monitor if home 02 needed.   Final next level of care: Home/Self Care Barriers to Discharge: No Barriers Identified   Patient Goals and CMS Choice Patient states their goals for this hospitalization and ongoing recovery are:: Home CMS Medicare.gov Compare Post Acute Care list provided to:: Patient Choice offered to / list presented to : Patient [place] ownership interest in [facility].provided to:: Patient    Discharge Placement                       Discharge Plan and Services Additional resources added to the After Visit Summary for     Discharge Planning Services: CM Consult                                 Social Drivers of Health (SDOH) Interventions SDOH Screenings   Food Insecurity: No Food Insecurity (10/10/2023)  Housing: Low Risk  (10/10/2023)  Transportation Needs: No Transportation Needs (10/10/2023)  Utilities: Not At Risk (10/10/2023)  Alcohol Screen: Low Risk  (04/10/2022)  Depression (PHQ2-9): Low Risk  (01/09/2023)  Financial Resource Strain: Low Risk  (04/10/2022)  Physical Activity: Inactive (04/10/2022)  Social Connections: Socially Integrated (10/11/2023)  Stress: No Stress Concern Present (04/10/2022)  Tobacco Use: Medium Risk (10/10/2023)     Readmission Risk Interventions    08/10/2021   10:15 AM  Readmission Risk Prevention Plan  Post Dischage Appt Complete  Medication Screening Complete  Transportation Screening Complete

## 2023-10-11 NOTE — Care Management Obs Status (Signed)
 MEDICARE OBSERVATION STATUS NOTIFICATION   Patient Details  Name: ALEPH NICKSON MRN: [id number] Date of Birth: [birth year]   Medicare Observation Status Notification Given:  Yes    MahabirOlegario Messier, RN 10/11/2023, 1:11 PM

## 2023-10-11 NOTE — Anesthesia Postprocedure Evaluation (Signed)
 Anesthesia Post Note  Patient: Danny [name]  Procedure(s) Performed: TURBT (TRANSURETHRAL RESECTION OF BLADDER TUMOR)     Patient location during evaluation: PACU Anesthesia Type: General Level of consciousness: sedated and patient cooperative Pain management: pain level controlled Vital Signs Assessment: post-procedure vital signs reviewed and stable Respiratory status: spontaneous breathing Cardiovascular status: stable Anesthetic complications: no Comments: Pulse as low as 29 in PACU. Pt denies syncope or dizziness at home. Discussed with Dr. Liliane Shi pt need to be watched overnight on tele due to profound bradycardia. I recommended metoprolol be held until HR improves.    No notable events documented.  Last Vitals:  Vitals:   10/11/23 0548 10/11/23 0900  BP: (!) 113/52 (!) 110/49  Pulse: (!) 43 60  Resp: 14   Temp: 36.4 C   SpO2: 100% (!) 86%    Last Pain:  Vitals:   10/11/23 0900  TempSrc: Oral  PainSc:                  Lewie Loron

## 2023-10-11 NOTE — Discharge Summary (Signed)
 Physician Discharge Summary  Danny [name]:[id number] DOB: 1934-05-02 DOA: 10/10/2023  PCP: Myrlene Broker, MD  Admit date: 10/10/2023 Discharge date: 10/11/2023  Admitted From: Home Disposition:  Home  Discharge Condition:Stable CODE STATUS:FULL Diet recommendation: Heart Healthy   Brief/Interim Summary: Patient is a 89 year old male with history of HFpEF, GERD, coronary artery disease, CKD, hyperlipidemia, bladder cancer who presented for repeat TURBT and underwent the procedure well without complication was admitted for the evaluation of bradycardia.  Postoperatively, he became bradycardic.  EKG shows sinus bradycardia.  No dizziness or symptoms at rest.   No evidence of heart blocks as per EKGs.  Cardiology was also consulted.  Discontinued metoprolol.  He remained mostly asymptomatic and ambulated well.  Cardiology cleared for discharge.  Recommend outpatient follow-up  Following problems were addressed during the hospitalization:  Sinus bradycardia: Remains asymptomatic at rest.  Takes metoprolol 25 mg twice daily, currently on hold.  EKG done on 4/2 showed sinus bradycardia without evidence of high-grade blocks.   cardiology consultation done.  Recommending to stop metoprolol permanently.  Since he remained asymptomatic, outpatient echo and cardiac monitoring planned with outpatient follow-up.  Patient remains hemodynamically stable and on the day of discharge   Recurrent papillary bladder tumor: History of high-grade T1 urothelial carcinoma the bladder.  Underwent cystoscopy with TURBT on 4/2.  Management as per urology   History of hyperlipidemia: On statin   History of HFpEF: Currently appears euvolemic   Chronic normocytic anemia/thrombocytopenia: Currently hemoglobin/platelets level stable.  Continue to monitor as outpatient   History of urinary retention: On oxybutynin  Discharge Diagnoses:  Principal Problem:   Bradycardia with 31-40 beats per minute Active  Problems:   Bradycardia    Discharge Instructions  Discharge Instructions     Call MD for:   Complete by: As directed    Call the office if you have persistently grossly bloody urine and/or catheter obstruction   Call MD for:  persistant nausea and vomiting   Complete by: As directed    Call MD for:  severe uncontrolled pain   Complete by: As directed    Call MD for:  temperature >100.4   Complete by: As directed    Diet - low sodium heart healthy   Complete by: As directed    Discharge instructions   Complete by: As directed    1.  Keep Foley catheter to drainage at all times 2.  Okay to shower   Discharge instructions   Complete by: As directed    1)Please follow up with your PCP in  a week 2)Follow up with Urology  3)You will be called by cardiology for outpatient follow up   Increase activity slowly   Complete by: As directed    Increase activity slowly   Complete by: As directed       Allergies as of 10/11/2023       Reactions   Lipitor [atorvastatin]    Muscle soreness        Medication List     STOP taking these medications    aspirin EC 81 MG tablet   metoprolol tartrate 25 MG tablet Commonly known as: LOPRESSOR       TAKE these medications    nitroGLYCERIN 0.4 MG SL tablet Commonly known as: NITROSTAT Place 1 tablet (0.4 mg total) under the tongue every 5 (five) minutes as needed for chest pain.   oxybutynin 5 MG tablet Commonly known as: DITROPAN Take 1 tablet (5 mg total) by mouth every  8 (eight) hours as needed for bladder spasms.   phenazopyridine 100 MG tablet Commonly known as: PYRIDIUM Take 1 tablet (100 mg total) by mouth 3 (three) times daily as needed for pain.   rosuvastatin 20 MG tablet Commonly known as: CRESTOR Take 1 tablet by mouth once daily        Follow-up Information     [facility] Follow up on 10/15/2023.   Why: Postop appointment for Foley catheter removal at 9:45 AM Contact  information: [address] [place] [address] [phone]        Tereso Newcomer T, PA-C Follow up.   Specialties: Cardiology, Physician Assistant Why: Hospital follow-up with Cardiology scheduled for 11/06/2023 at 2:20pm. Please arrive 15 minutes early for check-in. If this date/ time does not work for you, pleaes call our office to reschedule.  We are currently in the process of transitioning from two locations to one.  Effective November 05, 2023, all appointments that were previously scheduled at either our [facility] or [place] locations will be moved to our new location at [address], [place], [place], [address].  The phone number for our new location will be [phone]. Contact information: [address] [place] [address] [phone]                Allergies  Allergen Reactions   Lipitor [Atorvastatin]     Muscle soreness    Consultations: Cardiology   Procedures/Studies: No results found.    Subjective: Patient seen and examined at the bedside today.  Comfortably sitting on the bed. He was eating  his breakfast.  Denied any lightheadedness or dizziness.   Discharge Exam: Vitals:   10/11/23 0915 10/11/23 1547  BP:  (!) 111/47  Pulse:  (!) 59  Resp:    Temp:  97.7 F (36.5 C)  SpO2: 93% 100%   Vitals:   10/11/23 0548 10/11/23 0900 10/11/23 0915 10/11/23 1547  BP: (!) 113/52 (!) 110/49  (!) 111/47  Pulse: (!) 43 60  (!) 59  Resp: 14     Temp: 97.6 F (36.4 C)   97.7 F (36.5 C)  TempSrc: Oral Oral  Oral  SpO2: 100% (!) 86% 93% 100%  Weight:      Height:       Exam:   General exam: Overall comfortable, not in distress, pleasant elderly male HEENT: PERRL Respiratory system:  no wheezes or crackles  Cardiovascular system: Sinus bradycardia.  Gastrointestinal system: Abdomen is nondistended, soft and nontender. Central nervous system: Alert and oriented Extremities: No edema, no clubbing ,no  cyanosis Skin: No rashes, no ulcers,no icterus       The results of significant diagnostics from this hospitalization (including imaging, microbiology, ancillary and laboratory) are listed below for reference.     Microbiology: No results found for this or any previous visit (from the past 240 hours).   Labs: BNP (last 3 results) No results for input(s): "BNP" in the last 8760 hours. Basic Metabolic Panel: Recent Labs  Lab 10/11/23 0508  NA 137  K 4.8  CL 108  CO2 22  GLUCOSE 91  BUN 34*  CREATININE 1.21  CALCIUM 8.4*   Liver Function Tests: No results for input(s): "AST", "ALT", "ALKPHOS", "BILITOT", "PROT", "ALBUMIN" in the last 168 hours. No results for input(s): "LIPASE", "AMYLASE" in the last 168 hours. No results for input(s): "AMMONIA" in the last 168 hours. CBC: Recent Labs  Lab 10/11/23 0508  WBC 4.6  HGB 8.3*  HCT 25.0*  MCV 105.0*  PLT 106*   Cardiac Enzymes: No results for input(s): "CKTOTAL", "CKMB", "CKMBINDEX", "TROPONINI" in the last 168 hours. BNP: Invalid input(s): "POCBNP" CBG: No results for input(s): "GLUCAP" in the last 168 hours. D-Dimer No results for input(s): "DDIMER" in the last 72 hours. Hgb A1c No results for input(s): "HGBA1C" in the last 72 hours. Lipid Profile No results for input(s): "CHOL", "HDL", "LDLCALC", "TRIG", "CHOLHDL", "LDLDIRECT" in the last 72 hours. Thyroid function studies No results for input(s): "TSH", "T4TOTAL", "T3FREE", "THYROIDAB" in the last 72 hours.  Invalid input(s): "FREET3" Anemia work up No results for input(s): "VITAMINB12", "FOLATE", "FERRITIN", "TIBC", "IRON", "RETICCTPCT" in the last 72 hours. Urinalysis    Component Value Date/Time   COLORURINE YELLOW 07/27/2021 0436   APPEARANCEUR CLEAR 07/27/2021 0436   LABSPEC 1.010 07/27/2021 0436   PHURINE 6.5 07/27/2021 0436   GLUCOSEU NEGATIVE 07/27/2021 0436   HGBUR TRACE (A) 07/27/2021 0436   BILIRUBINUR large (A) 10/20/2022 1608   BILIRUBINUR  negative 01/19/2022 0924   KETONESUR small (15) (A) 10/20/2022 1608   KETONESUR NEGATIVE 07/27/2021 0436   PROTEINUR >=300 (A) 10/20/2022 1608   PROTEINUR Positive (A) 01/19/2022 0924   PROTEINUR NEGATIVE 07/27/2021 0436   UROBILINOGEN 4.0 (A) 10/20/2022 1608   UROBILINOGEN 0.2 11/28/2013 2032   NITRITE Positive (A) 10/20/2022 1608   NITRITE negative 01/19/2022 0924   NITRITE NEGATIVE 07/27/2021 0436   LEUKOCYTESUR Large (3+) (A) 10/20/2022 1608   LEUKOCYTESUR NEGATIVE 07/27/2021 0436   Sepsis Labs Recent Labs  Lab 10/11/23 0508  WBC 4.6   Microbiology No results found for this or any previous visit (from the past 240 hours).  Please note: You were cared for by a hospitalist during your hospital stay. Once you are discharged, your primary care physician will handle any further medical issues. Please note that NO REFILLS for any discharge medications will be authorized once you are discharged, as it is imperative that you return to your primary care physician (or establish a relationship with a primary care physician if you do not have one) for your post hospital discharge needs so that they can reassess your need for medications and monitor your lab values.    Time coordinating discharge: 40 minutes  SIGNED:   Burnadette Pop, MD  [facility] 10/11/2023, 3:48 PM Pager [phone]  If 7PM-7AM, please contact night-coverage [email or web address] Password [email or web address]

## 2023-10-11 NOTE — Consult Note (Addendum)
 Cardiology Consultation   Patient ID: Danny [name] MRN: [id number]; DOB: 1933-11-16  Admit date: 10/10/2023 Date of Consult: 10/11/2023  PCP:  Myrlene Broker, MD   [facility] Providers Cardiologist:  Kristeen Miss, MD        Patient Profile:   Danny [name] is a 89 y.o. male with a history of CAD with history of NSTEMI in 07/2021 s/p CABG x2 (RIMA-LAD, LIMA-OM1, SVG-OM2), ischemic cardiomyopathy/ chronic HFpEF, mild aortic insufficiency, CVA (chronic lacunar infarct noted on brain MRI in 07/2021), PAD with decreased ABIs on pre-CABG dopplers, bilateral carotid stenosis (right > left), hypertension, hyperlipidemia, GERD, anemia, erectile dysfunction, and bladder cancer but improved to who is being seen 10/11/2023 for the evaluation of bradycardia at the request of Dr. Liliane Shi.  History of Present Illness:   Danny [name] is a 89 year old male with the above history who is followed by Dr. Elease Hashimoto. Patient has a history of CAD with NSTEMI in 07/2021. He ultimately underwent CABG x2 with RIMA to LAD, LIMA to OM1, and SVG to OM2 that admission. EF was mildly reduced at 40-45% on LV gram during cardiac cathterization. However, Echo the following day showed EF of 55-60% with akinesis of the basal inferior wall and grade 1 diastolic dysfunction as well as mild AI and borderline dilatation of the aortic root measuring 39 mm. Pre-CABG dopplers showed 40-59% stenosis of right ICA and 1-39% stenosis of left ICA as well as a moderately reduce ABI (0.65) on the right and mildly reduced ABI (0.82) on the left. Last carotid dopplers in 07/2023 showed stable disease.  Patient was recently seen by Tereso Newcomer, PA-C, on 09/03/2023 for preop evaluation for upcoming TURBT.  At that time, he is doing well with no angina or shortness of breath.  He was able to complete greater than 4 METS of physical activity without any issues and was felt to be at risk for procedure.  EKG at that visit showed sinus  bradycardia 58 bpm, with PACs.  He presented to [facility] on 10/10/2023 for planned procedure and underwent successful cystoscopy with TURBT without any complications. He was noted to be bradycardic post-operatively. EKG showed sinus bradycardia, rate 39 bpm, with no acute ischemic changes. His home Lopressor was stopped and he was kept overnight for observation. Cardiology now consulted.   Patient is completely asymptomatic from a cardiac standpoint. He denies any chest pain, shortness of breath, palpitations, lightheadedness, dizziness, near syncope, or syncope. No recent fevers or illnesses. No URI or GI symptoms. No overt abnormal bleeding.    Patient states he previously stopped taking his Lopressor but was instructed to restart this on 08/23/2023 which is when he presented for pre-anesthesia visit. He states he has been taking it since then.   Past Medical History:  Diagnosis Date   (HFpEF) heart failure with preserved ejection fraction (HCC)    Echo 1/23: Inferior AK, mild aortic stenosis (mean 9 mmHg, V-max 210.5 cm/s, DI 0.70), EF 55-60, GR 1 DD, normal RVSF, normal PASP, trivial MR, mild AI, borderline dilation of aortic root (39 mm)   Acid reflux    hiatal hernia   Anemia    Bladder cancer (HCC)    CAD (coronary artery disease)    S/p non-STEMI 1/23 >> s/p CABG   Carotid artery disease (HCC) 08/23/2021   Pre-CABG Dopplers 1/23:R ICA 40-59; L ICA 1-39 // Carotid US 07/20/2023: RICA 40-59; LICA 1-39   CHF (congestive heart failure) (HCC)  Chronic kidney disease (CKD)    Coronary artery disease involving native coronary artery of native heart without angina pectoris 05/16/2022   Inf NSTEMI s/p CABG in 07/2021 (RIMA-LAD, LIMA-OM1, S-OM2)   Diverticulitis    Erectile dysfunction 04/20/2015   Essential hypertension 04/20/2015   History of colon polyps    History of kidney stones    History of stroke    Noted on brain MRI 07/2021   Hyperlipidemia 05/28/2015   PAD (peripheral artery  disease) (HCC)    Pre-CABG Dopplers 1/23: Right ABI 0.65; left ABI 0.82   Skin cancer    Stroke [facility])    noted MRI 2023 pt. unaware    Past Surgical History:  Procedure Laterality Date   APPENDECTOMY     cataract surgery     CORONARY ARTERY BYPASS GRAFT N/A 08/01/2021   Procedure: CORONARY ARTERY BYPASS GRAFTING (CABG) X 3  ,ON PUMP, USING LEFT AND RIGHT INTERNAL MAMMARY ARTERIES, RIGHT AND LEFT ENDOSCOPIC GREATER SAPHENOUS VEIN CONDUITS;  Surgeon: Alleen Borne, MD;  Location: [facility] OR;  Service: Open Heart Surgery;  Laterality: N/A;   ENDOVEIN HARVEST OF GREATER SAPHENOUS VEIN Right 08/01/2021   Procedure: ENDOVEIN HARVEST OF GREATER SAPHENOUS VEIN;  Surgeon: Alleen Borne, MD;  Location: [facility] OR;  Service: Open Heart Surgery;  Laterality: Right;   LEFT HEART CATH AND CORONARY ANGIOGRAPHY N/A 07/27/2021   Procedure: LEFT HEART CATH AND CORONARY ANGIOGRAPHY;  Surgeon: Iran Ouch, MD;  Location: [facility] INVASIVE CV LAB;  Service: Cardiovascular;  Laterality: N/A;   SKIN CANCER EXCISION     TEE WITHOUT CARDIOVERSION N/A 08/01/2021   Procedure: TRANSESOPHAGEAL ECHOCARDIOGRAM (TEE);  Surgeon: Alleen Borne, MD;  Location: [facility] OR;  Service: Open Heart Surgery;  Laterality: N/A;   TRANSURETHRAL RESECTION OF BLADDER TUMOR N/A 01/03/2023   Procedure: TRANSURETHRAL RESECTION OF BLADDER TUMOR (TURBT);  Surgeon: Rene Paci, MD;  Location: [facility] ORS;  Service: Urology;  Laterality: N/A;  90 MINUTES   TRANSURETHRAL RESECTION OF BLADDER TUMOR N/A 10/10/2023   Procedure: TURBT (TRANSURETHRAL RESECTION OF BLADDER TUMOR);  Surgeon: Rene Paci, MD;  Location: [facility] ORS;  Service: Urology;  Laterality: N/A;   TRANSURETHRAL RESECTION OF PROSTATE       Home Medications:  Prior to Admission medications   Medication Sig Start Date End Date Taking? Authorizing Provider  aspirin EC 81 MG tablet Take 81 mg by mouth daily. Swallow whole.   Yes [provider]  metoprolol  tartrate (LOPRESSOR) 25 MG tablet Take 1 tablet by mouth twice daily 12/07/22  Yes Nahser, Deloris Ping, MD  nitroGLYCERIN (NITROSTAT) 0.4 MG SL tablet Place 1 tablet (0.4 mg total) under the tongue every 5 (five) minutes as needed for chest pain. 05/16/22 10/01/23 Yes Weaver, Scott T, PA-C  oxybutynin (DITROPAN) 5 MG tablet Take 1 tablet (5 mg total) by mouth every 8 (eight) hours as needed for bladder spasms. 10/10/23  Yes Rene Paci, MD  phenazopyridine (PYRIDIUM) 100 MG tablet Take 1 tablet (100 mg total) by mouth 3 (three) times daily as needed for pain. 10/10/23 10/09/24 Yes Rene Paci, MD  rosuvastatin (CRESTOR) 20 MG tablet Take 1 tablet by mouth once daily 12/07/22  Yes Nahser, Deloris Ping, MD    Inpatient Medications: Scheduled Meds:  rosuvastatin  20 mg Oral QHS   Continuous Infusions:  PRN Meds: acetaminophen, diphenhydrAMINE **OR** diphenhydrAMINE, HYDROmorphone (DILAUDID) injection, nitroGLYCERIN, ondansetron, oxybutynin, oxyCODONE-acetaminophen, senna-docusate  Allergies:    Allergies  Allergen Reactions   Lipitor [  Atorvastatin]     Muscle soreness    Social History:   Social History   Socioeconomic History   Marital status: Married    Spouse name: Not on file   Number of children: 1   Years of education: 12   Highest education level: Not on file  Occupational History   Occupation: Retired  Tobacco Use   Smoking status: Former    Types: Cigars   Smokeless tobacco: Never   Tobacco comments:    Quit 2016  Vaping Use   Vaping status: Never Used  Substance and Sexual Activity   Alcohol use: Yes    Comment: Occasional beer   Drug use: No   Sexual activity: Not Currently  Other Topics Concern   Not on file  Social History Narrative   Lives with wife    Caffeine use: 1-2 cups coffee in the am and 1 glass tea qpm   Social Drivers of Health   Financial Resource Strain: Low Risk  (04/10/2022)   Overall Financial Resource Strain (CARDIA)     Difficulty of Paying Living Expenses: Not hard at all  Food Insecurity: No Food Insecurity (10/10/2023)   Hunger Vital Sign    Worried About Running Out of Food in the Last Year: Never true    Ran Out of Food in the Last Year: Never true  Transportation Needs: No Transportation Needs (10/10/2023)   PRAPARE - Administrator, Civil Service (Medical): No    Lack of Transportation (Non-Medical): No  Physical Activity: Inactive (04/10/2022)   Exercise Vital Sign    Days of Exercise per Week: 0 days    Minutes of Exercise per Session: 0 min  Stress: No Stress Concern Present (04/10/2022)   [employer] - Occupational Stress Questionnaire    Feeling of Stress : Not at all  Social Connections: Socially Integrated (10/11/2023)   Social Connection and Isolation Panel [NHANES]    Frequency of Communication with Friends and Family: More than three times a week    Frequency of Social Gatherings with Friends and Family: More than three times a week    Attends Religious Services: More than 4 times per year    Active Member of [employer] or Organizations: Yes    Attends Engineer, structural: More than 4 times per year    Marital Status: Married  Catering manager Violence: Not At Risk (10/10/2023)   Humiliation, Afraid, Rape, and Kick questionnaire    Fear of Current or Ex-Partner: No    Emotionally Abused: No    Physically Abused: No    Sexually Abused: No    Family History:   Family History  Problem Relation Age of Onset   Stroke Father 70   Sudden death Mother 25       unknown cause     ROS:  Please see the history of present illness.   Physical Exam/Data:   Vitals:   10/11/23 0300 10/11/23 0548 10/11/23 0900 10/11/23 0915  BP:  (!) 113/52 (!) 110/49   Pulse:  (!) 43 60   Resp: 13 14    Temp:  97.6 F (36.4 C)    TempSrc:  Oral Oral   SpO2:  100% (!) 86% 93%  Weight:      Height:        Intake/Output Summary (Last 24 hours) at 10/11/2023  1157 Last data filed at 10/11/2023 0930 Gross per 24 hour  Intake 940 ml  Output 625 ml  Net 315 ml      10/10/2023   11:46 AM 10/04/2023   10:58 AM 09/03/2023    2:52 PM  Last 3 Weights  Weight (lbs) 127 lb 127 lb 127 lb 9.6 oz  Weight (kg) 57.607 kg 57.607 kg 57.879 kg     Body mass index is 21.8 kg/m.  General: 89 y.o. thin Caucasian male resting comfortably in no acute distress. HEENT: Normocephalic and atraumatic. Sclera clear.  Neck: Supple. Bilateral carotid bruits (right > left). No JVD. Heart: Mildly bradycardic with normal rhythm. III/VI systolic murmur best heard at the apex.  Lungs: No increased work of breathing. Clear to ausculation bilaterally. No wheezes, rhonchi, or rales.  MSK: Normal strength and tone for age. Extremities: No lower extremity edema.    Skin: Warm and dry. Neuro: Alert and oriented x3. No focal deficits. Psych: Normal affect. Responds appropriately.   EKG:  The EKG was personally reviewed and demonstrates:  Sinus bradycardia, rate 39 bpm, with no acute ischemic changes. Telemetry:  Telemetry was personally reviewed and demonstrates:  Sinus bradycardia with rate in the 40s to 50s. One pause measuring 3.63 seconds was noted at 9:50am. He also has frequent PACs and one very short run of NSVT.  Relevant CV Studies:  Echocardiogram 07/28/2021: Impressions:  1. Akinesis of the basal inferior wall with overall preserved LV  function; calcified aortic valve with very mild AS (mean gradient 9 mmHg).   2. Left ventricular ejection fraction, by estimation, is 55 to 60%. The  left ventricle has normal function. The left ventricle demonstrates  regional wall motion abnormalities (see scoring diagram/findings for  description). Left ventricular diastolic  parameters are consistent with Grade I diastolic dysfunction (impaired  relaxation).   3. Right ventricular systolic function is normal. The right ventricular  size is normal. There is normal pulmonary  artery systolic pressure.   4. The mitral valve is normal in structure. Trivial mitral valve  regurgitation. No evidence of mitral stenosis.   5. The aortic valve is calcified. Aortic valve regurgitation is mild. No  aortic stenosis is present.   6. Aortic dilatation noted. There is borderline dilatation of the aortic  root, measuring 39 mm.   7. The inferior vena cava is normal in size with greater than 50%  respiratory variability, suggesting right atrial pressure of 3 mmHg.    Laboratory Data:  High Sensitivity Troponin:  No results for input(s): "TROPONINIHS" in the last 720 hours.   Chemistry Recent Labs  Lab 10/11/23 0508  NA 137  K 4.8  CL 108  CO2 22  GLUCOSE 91  BUN 34*  CREATININE 1.21  CALCIUM 8.4*  GFRNONAA 58*  ANIONGAP 7    No results for input(s): "PROT", "ALBUMIN", "AST", "ALT", "ALKPHOS", "BILITOT" in the last 168 hours. Lipids No results for input(s): "CHOL", "TRIG", "HDL", "LABVLDL", "LDLCALC", "CHOLHDL" in the last 168 hours.  Hematology Recent Labs  Lab 10/11/23 0508  WBC 4.6  RBC 2.38*  HGB 8.3*  HCT 25.0*  MCV 105.0*  MCH 34.9*  MCHC 33.2  RDW 14.3  PLT 106*   Thyroid No results for input(s): "TSH", "FREET4" in the last 168 hours.  BNPNo results for input(s): "BNP", "PROBNP" in the last 168 hours.  DDimer No results for input(s): "DDIMER" in the last 168 hours.   Radiology/Studies:  No results found.   Assessment and Plan:   Sinus Bradycardia Patient presented for planned cystoscopy with TURBT for management of bladder cancer and was noted to be  bradycardic following surgery. EKG showed sinus bradycardia with rate of 39 bpm. Telemetry shows sinus bradycardia with rates int he 40s to 50s but one pause of 3.63 at 9:50am this morning but no other pauses and no evidence of high grade AV block. She also has frequent PACs on telemetry. - Asymptomatic. Hemodynamically stable. - Potassium 4.8.  - Will check Magnesium and TSH. - Agree with  holding home Lopressor. Avoid any other AV nodal agents.  - Would recommend updating an Echo since he has not had once since 07/2021. However, this may be able to be done as an outpatient. Will discuss with MD. - Recommend 2 week Zio monitor as an outpatient to evaluation for high grade AV block or significant pauses.   CAD s/p CABG History of CABG x3 in 07/2021.  - No chest pain.  - Continue Aspirin 81mg  daily and Crestor 20mg  daily.  Ischemic Cardiomyopathy Chronic HFpEF EF was 40-45% on LV gram at time of MI in 07/2021. However, Echo showed 55-60% with akinesis of the basal inferior wall and grade 1 diastolic dysfunction as well as mild AI and borderline dilatation of the aortic root measuring 39 mm.  - Euvolemic on exam.  - No need for diuresis at this time.  Systolic Murmur  Patient has a prominent systolic murmur that sounds consistent with mitral regurgitation. Last Echo in 07/2021 showed mild AI and only trace MR.  - Recommend repeat Echo but this may be able to be done as an outpatient. Will discuss with MD.  Hypertension BP well controlled.  - Home Lopressor stopped due to bradycardia.   Hyperlipidemia  - Continue Crestor 40mg  daily.     Risk Assessment/Risk Scores:   For questions or updates, please contact [facility] Please consult [email or web address] for contact info under    Signed, Corrin Parker, PA-C  10/11/2023 11:57 AM  Patient seen and examined with Marjie Skiff, PA-C.  Agree as above, with the following exceptions and changes as noted below.  Patient is a pleasant 89 year old gentleman who underwent TURBT yesterday, which was overall uneventful.  Preoperatively he had been placed back on his beta-blockade at his preoperative anesthesia visit.  He had bradycardia postoperatively and overnight and early this morning had a 3.6-second pause, sinus pause.  Since then he has ambulated easily in the hallways, he comments to me that the nurses said he was  running.  He has been able to have adequate p.o. intake with a good appetite.  He is asymptomatic and overall feels quite well.  He is the primary caregiver of his wife who has multiple orthopedic issues, and his daughter lives at home with him.  He does not drive.  Gen: NAD, CV: RRR, 2/6 systolic ejection murmur mid peaking with preserved S2 heard best left upper sternal border sitting upright, Lungs: clear, Abd: soft, Extrem: Warm, well perfused, no edema, Neuro/Psych: alert and oriented x 3, normal mood and affect. All available labs, radiology testing, previous records reviewed.  Patient had some postoperative bradycardia last dose of metoprolol was October 10, 2023 in the AM before surgery.  Full washout of the beta-blocker will be by tomorrow evening, however the patient is feeling quite well and is eager to go home.  Telemetry has been quiet since this morning with increasing heart rates, currently still bradycardic but rates in the mid 50s.  Suspect he will continue to improve as beta-blocker washes out. -Discontinue metoprolol home-going -Order outpatient echocardiogram to follow-up aortic murmur, suspect aortic valve  stenosis is now moderate by exam -Order outpatient cardiac monitor for 2 weeks to screen for further pauses or conduction system disease -Outpatient follow-up shortly after. Patient has been instructed to call our office with any concerning symptoms, he will have his daughter monitor his heart rate and his blood pressure tonight.  Shared decision making performed, no symptoms, stable for discharge from the hospital from a cardiovascular perspective.  Parke Poisson, MD 10/11/23 3:23 PM

## 2023-10-11 NOTE — Progress Notes (Signed)
 1 Day Post-Op Subjective: No acute events overnight.  Patient admitted after anesthesia team was concerned about sinus bradycardia, which has been a chronic issue for him.    Objective: Vital signs in last 24 hours: Temp:  [97.1 F (36.2 C)-98 F (36.7 C)] 97.6 F (36.4 C) (04/03 0548) Pulse Rate:  [28-117] 43 (04/03 0548) Resp:  [8-20] 14 (04/03 0548) BP: (113-177)/(48-99) 113/52 (04/03 0548) SpO2:  [88 %-100 %] 100 % (04/03 0548) Weight:  [57.6 kg] 57.6 kg (04/02 1146)  Intake/Output from previous day: 04/02 0701 - 04/03 0700 In: 640 [P.O.:240; I.V.:400] Out: 625 [Urine:600; Blood:25]  Intake/Output this shift: No intake/output data recorded.  Physical Exam:  General: Alert and oriented CV: RRR, palpable distal pulses Lungs: CTAB, equal chest rise Abdomen: Soft, NTND, no rebound or guarding Gu: Foley catheter in place and draining slightly blood-tinged urine Ext: NT, No erythema  Lab Results: Recent Labs    10/11/23 0508  HGB 8.3*  HCT 25.0*   BMET Recent Labs    10/11/23 0508  NA 137  K 4.8  CL 108  CO2 22  GLUCOSE 91  BUN 34*  CREATININE 1.21  CALCIUM 8.4*     Studies/Results: No results found.  Assessment/Plan: 89 year old male POD 1 status post TURBT with postoperative sinus bradycardia  -Will await the hospitalist recommendations for further evaluation of his bradycardia.  If stable, okay for discharge from a GU standpoint.  Please call [phone] with further recommendations   LOS: 0 days   Rhoderick Moody, MD [facility] Pager: [phone]  10/11/2023, 8:34 AM

## 2023-10-11 NOTE — Progress Notes (Signed)
 Ordered outpatient for further evaluation of aortic valve disease and outpatient 2 week Zio monitor for further evaluation of bradycardia. Please see consult note from today for more information.  Corrin Parker, PA-C 10/11/2023 8:04 PM

## 2023-10-11 NOTE — Telephone Encounter (Signed)
 Tried to call patient. There was no answer.

## 2023-10-11 NOTE — Telephone Encounter (Signed)
-----   Message from Tereso Newcomer sent at 10/10/2023  4:56 PM EDT ----- Pt had bladder surgery today. Anesthesia contacted me about his HR in the 30s.  Have the patient take a 1/2 tab of his metoprolol for 3 days starting tomorrow and then stop it.  Schedule nurse visit in 3-4 weeks for BP check and EKG. Tereso Newcomer, PA-C    10/10/2023 4:57 PM

## 2023-10-12 NOTE — Telephone Encounter (Signed)
 Pt returning call

## 2023-10-12 NOTE — Telephone Encounter (Signed)
 Returned call. Keeps ringing. No answer.

## 2023-10-15 ENCOUNTER — Ambulatory Visit: Attending: Student

## 2023-10-15 ENCOUNTER — Encounter: Payer: Self-pay | Admitting: *Deleted

## 2023-10-15 DIAGNOSIS — R001 Bradycardia, unspecified: Secondary | ICD-10-CM

## 2023-10-15 NOTE — Progress Notes (Unsigned)
 Enrolled for [employer] to mail a ZIO XT long term holter monitor to the patients address on file.   Dr. Elease Hashimoto to read.

## 2023-10-16 ENCOUNTER — Encounter (HOSPITAL_COMMUNITY): Payer: Self-pay | Admitting: Student

## 2023-10-16 NOTE — Telephone Encounter (Signed)
 Attempted again to contact patient, but still just ringing and no answer. Per Chart by Marjie Skiff:  Ordered outpatient for further evaluation of aortic valve disease and outpatient 2 week Zio monitor for further evaluation of bradycardia. Please see consult note from today for more information.   Corrin Parker, PA-C 10/11/2023 8:04 PM   Metoprolol no longer active on med list and pt will complete zio monitor which was mailed to him yesterday. Will close encounter.

## 2023-10-25 DIAGNOSIS — C674 Malignant neoplasm of posterior wall of bladder: Secondary | ICD-10-CM | POA: Diagnosis not present

## 2023-11-06 ENCOUNTER — Encounter: Payer: Self-pay | Admitting: Physician Assistant

## 2023-11-06 ENCOUNTER — Ambulatory Visit: Attending: Physician Assistant | Admitting: Physician Assistant

## 2023-11-06 VITALS — BP 140/70 | HR 69 | Ht 64.0 in | Wt 127.4 lb

## 2023-11-06 DIAGNOSIS — I359 Nonrheumatic aortic valve disorder, unspecified: Secondary | ICD-10-CM

## 2023-11-06 DIAGNOSIS — R001 Bradycardia, unspecified: Secondary | ICD-10-CM | POA: Diagnosis not present

## 2023-11-06 DIAGNOSIS — I2510 Atherosclerotic heart disease of native coronary artery without angina pectoris: Secondary | ICD-10-CM | POA: Diagnosis not present

## 2023-11-06 DIAGNOSIS — I5032 Chronic diastolic (congestive) heart failure: Secondary | ICD-10-CM

## 2023-11-06 DIAGNOSIS — I10 Essential (primary) hypertension: Secondary | ICD-10-CM

## 2023-11-06 DIAGNOSIS — N1831 Chronic kidney disease, stage 3a: Secondary | ICD-10-CM

## 2023-11-06 MED ORDER — ASPIRIN 81 MG PO TBEC: 81.0000 mg | DELAYED_RELEASE_TABLET | Freq: Every day | ORAL | 11 refills | Status: DC

## 2023-11-06 NOTE — Assessment & Plan Note (Signed)
 Coronary artery disease status post non-STEMI in January 2023 followed by CABG. Currently well-managed without chest discomfort or angina. Aspirin  was held prior to bladder surgery but should be resumed. - Resume aspirin  81 mg daily. - Continue Crestor  20 mg daily.

## 2023-11-06 NOTE — Assessment & Plan Note (Signed)
Recheck BMET today 

## 2023-11-06 NOTE — Progress Notes (Signed)
 Cardiology Office Note:    Date:  11/06/2023  ID:  Danny [name], DOB 1933-08-09, MRN [id number] PCP: Danny Homestead, MD  [facility] Providers Cardiologist:  Danny Alert, MD       Patient Profile:      Coronary artery disease Inf NSTEMI s/p CABG in 07/2021 RIMA-LAD, LIMA-OM1, S-OM2 Ischemic CM EF 40-45 by LV gram 1/23 Echocardiogram 07/28/21: EF 55-60, inf AK, mild AS (mean 9), Gr 1 DD, NL RVSF, NL PASP (HFpEF) heart failure with preserved ejection fraction  Aortic stenosis  Mild - Echo 1/23: Mean gradient 9 mmHg, Vmax 210.5 cm/s, DI 0.70 Carotid artery disease US  07/20/23: RICA 40-59; LICA 1-39  Peripheral arterial disease ABIs 1/23: R 0.65; L 0.82 Hypertension  Hyperlipidemia  Myalgias with Atorvastatin  Chronic kidney disease stage III 08/2023: GFR 54 History of CVA (MRI 1/23: Chronic lacunar infarct left thalamus) Hx of syncope GERD ED Borderline dilation of aortic root (39 mm) - Echocardiogram 07/2021  Anemia     Bladder CA s/p TURBT 10/2023       Discussed the use of AI scribe software for clinical note transcription with the patient, who gave verbal consent to proceed.  History of Present Illness Danny [name] is a 89 y.o. male who returns for post hospital follow up. He was admitted earlier this month for TURBT.  Postoperatively, he was noted to be bradycardic with heart rate 39.  He was asymptomatic.  He was evaluated by cardiology.  He was noted to have 1 pause at 3.63 seconds and heart rates in the 40s to 50s.  His metoprolol  tartrate was stopped.  His murmur was felt to be more prominent on exam.  Outpatient event monitor and outpatient echocardiogram were both recommended.  His monitor is currently pending.  His echocardiogram has been scheduled.  He is here alone.  He has not had chest discomfort or angina. He had held aspirin  prior to his bladder surgery but has not resumed it.  He has not had significant shortness of breath or swelling in his  legs.  He has not had syncope, orthopnea.   ROS-See HPI    Studies Reviewed:   EKG Interpretation Date/Time:  Tuesday November 06 2023 14:55:14 EDT Ventricular Rate:  69 PR Interval:  180 QRS Duration:  100 QT Interval:  394 QTC Calculation: 422 R Axis:   64  Text Interpretation: Sinus rhythm with Premature atrial complexes Anteroseptal infarct T wave inversion III, aVF Heart rate improved. Otherwise no significant change Confirmed by Danny [name] [phone]) on 11/06/2023 3:30:26 PM    Results    Risk Assessment/Calculations:     HYPERTENSION CONTROL Vitals:   11/06/23 1451 11/06/23 1545  BP: 130/60 (!) 140/70    The patient's blood pressure is elevated above target today.  In order to address the patient's elevated BP: Blood pressure will be monitored at home to determine if medication changes need to be made.          Physical Exam:   VS:  BP (!) 140/70   Pulse 69   Ht 5\' 4"  (1.626 m)   SpO2 97%   BMI 21.80 kg/m    Wt Readings from Last 3 Encounters:  10/10/23 127 lb (57.6 kg)  10/04/23 127 lb (57.6 kg)  09/03/23 127 lb 9.6 oz (57.9 kg)    Constitutional:      Appearance: Healthy appearance. Not in distress.  Neck:     Vascular: JVD normal.  Pulmonary:  Breath sounds: Normal breath sounds. No wheezing. No rales.  Cardiovascular:     Normal rate. Regular rhythm.     Murmurs: There is a grade 2/6 crescendo-decrescendo systolic murmur at the URSB.  Edema:    Peripheral edema present.    Ankle: bilateral 1+ edema of the ankle. Abdominal:     Palpations: Abdomen is soft.        Assessment and Plan:   Assessment & Plan Bradycardia Bradycardia with heart rates as low as 39 during recent hospitalization. Improved off beta blocker therapy.  Heart rate on EKG normal.   He brought his heart monitor with him so that someone could place it on him today. - Place heart monitor today to rule out significant bradycardia or pauses. Aortic valve disease Mild aortic  stenosis with a mean gradient of 9 as of January 2023. Prominent murmur on exam. Echocardiogram was ordered but not yet scheduled. - Schedule echocardiogram to further evaluate aortic stenosis. Chronic heart failure with preserved ejection fraction (HCC) He has chronic lower extremity edema related to venous insufficiency.  EF has been normal by most recent echocardiogram January 2023.  NYHA II-IIb.  Volume status stable.  Is not currently on diuretic therapy. Coronary artery disease involving native coronary artery of native heart without angina pectoris Coronary artery disease status post non-STEMI in January 2023 followed by CABG. Currently well-managed without chest discomfort or angina. Aspirin  was held prior to bladder surgery but should be resumed. - Resume aspirin  81 mg daily. - Continue Crestor  20 mg daily. Essential hypertension Blood pressure somewhat borderline.  He is now off of metoprolol .  I have asked him to monitor his blood pressure over the next 2 weeks and send readings to me.   - Obtain BMET today.   - If blood pressure remains above target add losartan  25 mg daily. Stage 3a chronic kidney disease (HCC) Recheck BMET today.        Dispo:  Return in about 3 months (around 02/05/2024) for Routine Follow Up, w/ Danny Single, PA-C.  Signed, Danny Single, PA-C

## 2023-11-06 NOTE — Assessment & Plan Note (Signed)
 He has chronic lower extremity edema related to venous insufficiency.  EF has been normal by most recent echocardiogram January 2023.  NYHA II-IIb.  Volume status stable.  Is not currently on diuretic therapy.

## 2023-11-06 NOTE — Assessment & Plan Note (Signed)
 Bradycardia with heart rates as low as 39 during recent hospitalization. Improved off beta blocker therapy.  Heart rate on EKG normal.   He brought his heart monitor with him so that someone could place it on him today. - Place heart monitor today to rule out significant bradycardia or pauses.

## 2023-11-06 NOTE — Patient Instructions (Addendum)
 Medication Instructions:  Your physician has recommended you make the following change in your medication:  RESTART Aspirin  81 mg taking 1 tablet daily  *If you need a refill on your cardiac medications before your next appointment, please call your pharmacy*  Lab Work: TODAY:  BMET  If you have labs (blood work) drawn today and your tests are completely normal, you will receive your results only by: MyChart Message (if you have MyChart) OR A paper copy in the mail If you have any lab test that is abnormal or we need to change your treatment, we will call you to review the results.  Testing/Procedures: None ordered today, but need to get the Echocardiogram scheduled that was ordered from the hospital.  Follow-Up: At [facility], you and your health needs are our priority.  As part of our continuing mission to provide you with exceptional heart care, our providers are all part of one team.  This team includes your primary Cardiologist (physician) and Advanced Practice Providers or APPs (Physician Assistants and Nurse Practitioners) who all work together to provide you with the care you need, when you need it.  Your next appointment:   3 month(s)  Provider:   Marlyse Single, PA-C          We recommend signing up for the patient portal called "MyChart".  Sign up information is provided on this After Visit Summary.  MyChart is used to connect with patients for Virtual Visits (Telemedicine).  Patients are able to view lab/test results, encounter notes, upcoming appointments, etc.  Non-urgent messages can be sent to your provider as well.   To learn more about what you can do with MyChart, go to [email or web address].   Other Instructions Your physician has requested that you regularly monitor and record your blood pressure readings at home. Please use the same machine at the same time of day to check your readings and record them to bring to your follow-up visit.   Please monitor  blood pressures and keep a log of your readings for 2 weeks then call them into the office [phone].    Make sure to check 2 hours after your medications.    AVOID these things for 30 minutes before checking your blood pressure: No Drinking caffeine. No Drinking alcohol. No Eating. No Smoking. No Exercising.   Five minutes before checking your blood pressure: Pee. Sit in a dining chair. Avoid sitting in a soft couch or armchair. Be quiet. Do not talk

## 2023-11-06 NOTE — Assessment & Plan Note (Signed)
 Blood pressure somewhat borderline.  He is now off of metoprolol .  I have asked him to monitor his blood pressure over the next 2 weeks and send readings to me.   - Obtain BMET today.   - If blood pressure remains above target add losartan  25 mg daily.

## 2023-11-07 LAB — BASIC METABOLIC PANEL WITH GFR
BUN/Creatinine Ratio: 22 (ref 10–24)
BUN: 29 mg/dL — ABNORMAL HIGH (ref 8–27)
CO2: 21 mmol/L (ref 20–29)
Calcium: 9.4 mg/dL (ref 8.6–10.2)
Chloride: 103 mmol/L (ref 96–106)
Creatinine, Ser: 1.33 mg/dL — ABNORMAL HIGH (ref 0.76–1.27)
Glucose: 85 mg/dL (ref 70–99)
Potassium: 5.6 mmol/L — ABNORMAL HIGH (ref 3.5–5.2)
Sodium: 139 mmol/L (ref 134–144)
eGFR: 52 mL/min/{1.73_m2} — ABNORMAL LOW (ref >59)

## 2023-11-12 ENCOUNTER — Telehealth: Payer: Self-pay | Admitting: *Deleted

## 2023-11-12 ENCOUNTER — Other Ambulatory Visit: Payer: Self-pay | Admitting: *Deleted

## 2023-11-12 DIAGNOSIS — I10 Essential (primary) hypertension: Secondary | ICD-10-CM

## 2023-11-12 NOTE — Telephone Encounter (Signed)
-----   Message from Marlyse Single sent at 11/07/2023  4:52 PM EDT ----- Renal function stable. K+ elevated.  PLAN:  - Limit dietary K+ - If he takes any OTC K+ supplements, stop - BMET 1 week Marlyse Single, PA-C    11/07/2023 4:50 PM

## 2023-11-13 DIAGNOSIS — I10 Essential (primary) hypertension: Secondary | ICD-10-CM | POA: Diagnosis not present

## 2023-11-13 LAB — BASIC METABOLIC PANEL WITH GFR
BUN/Creatinine Ratio: 24 (ref 10–24)
BUN: 40 mg/dL — ABNORMAL HIGH (ref 8–27)
CO2: 22 mmol/L (ref 20–29)
Calcium: 9.2 mg/dL (ref 8.6–10.2)
Chloride: 105 mmol/L (ref 96–106)
Creatinine, Ser: 1.66 mg/dL — ABNORMAL HIGH (ref 0.76–1.27)
Glucose: 111 mg/dL — ABNORMAL HIGH (ref 70–99)
Potassium: 5.5 mmol/L — ABNORMAL HIGH (ref 3.5–5.2)
Sodium: 140 mmol/L (ref 134–144)
eGFR: 40 mL/min/{1.73_m2} — ABNORMAL LOW (ref >59)

## 2023-11-20 ENCOUNTER — Ambulatory Visit (HOSPITAL_BASED_OUTPATIENT_CLINIC_OR_DEPARTMENT_OTHER): Payer: Self-pay | Admitting: *Deleted

## 2023-11-20 DIAGNOSIS — N189 Chronic kidney disease, unspecified: Secondary | ICD-10-CM

## 2023-11-23 ENCOUNTER — Encounter (HOSPITAL_BASED_OUTPATIENT_CLINIC_OR_DEPARTMENT_OTHER): Payer: Self-pay

## 2023-11-23 ENCOUNTER — Ambulatory Visit

## 2023-11-23 VITALS — Ht 64.0 in | Wt 127.0 lb

## 2023-11-23 DIAGNOSIS — Z0000 Encounter for general adult medical examination without abnormal findings: Secondary | ICD-10-CM

## 2023-11-23 NOTE — Patient Instructions (Signed)
 [name] , Thank you for taking time out of your busy schedule to complete your Annual Wellness Visit with me. I enjoyed our conversation and look forward to speaking with you again next year. I, as well as your care team,  appreciate your ongoing commitment to your health goals. Please review the following plan we discussed and let me know if I can assist you in the future. Your Game plan/ To Do List     Follow up Visits: Next Medicare AWV with our clinical staff: 11/27/2024.   Have you seen your provider in the last 6 months (3 months if uncontrolled diabetes)? No Next Office Visit with your provider: Patient stated that he will call closer to July to schedule his visit with Dr. Nicolette Barrio.  Clinician Recommendations:  Aim for 30 minutes of exercise or brisk walking, 6-8 glasses of water , and 5 servings of fruits and vegetables each day. Keep up the good work.      This is a list of the screening recommended for you and due dates:  Health Maintenance  Topic Date Due   DTaP/Tdap/Td vaccine (1 - Tdap) Never done   Pneumonia Vaccine (1 of 2 - PCV) Never done   Zoster (Shingles) Vaccine (1 of 2) Never done   COVID-19 Vaccine (3 - Pfizer risk series) 11/11/2019   Medicare Annual Wellness Visit  04/11/2023   Flu Shot  02/08/2024   HPV Vaccine  Aged Out   Meningitis B Vaccine  Aged Out    Advanced directives: (Declined) Advance directive discussed with you today. Even though you declined this today, please call our office should you change your mind, and we can give you the proper paperwork for you to fill out. Advance Care Planning is important because it:  [x]  Makes sure you receive the medical care that is consistent with your values, goals, and preferences  [x]  It provides guidance to your family and loved ones and reduces their decisional burden about whether or not they are making the right decisions based on your wishes.  Follow the link provided in your after visit summary or read  over the paperwork we have mailed to you to help you started getting your Advance Directives in place. If you need assistance in completing these, please reach out to us  so that we can help you!  See attachments for Preventive Care and Fall Prevention Tips.

## 2023-11-23 NOTE — Progress Notes (Signed)
 Subjective:   Danny [name] is a 89 y.o. who presents for a Medicare Wellness preventive visit.  As a reminder, Annual Wellness Visits don't include a physical exam, and some assessments may be limited, especially if this visit is performed virtually. We may recommend an in-person follow-up visit with your provider if needed.  Visit Complete: Virtual I connected with  Dalon R Askren on 11/23/23 by a audio enabled telemedicine application and verified that I am speaking with the correct person using two identifiers.  Patient Location: Home  Provider Location: Office/Clinic  I discussed the limitations of evaluation and management by telemedicine. The patient expressed understanding and agreed to proceed.  Vital Signs: Because this visit was a virtual/telehealth visit, some criteria may be missing or patient reported. Any vitals not documented were not able to be obtained and vitals that have been documented are patient reported.  VideoDeclined- This patient declined Librarian, academic. Therefore the visit was completed with audio only.  Persons Participating in Visit: Patient.  AWV Questionnaire: No: Patient Medicare AWV questionnaire was not completed prior to this visit.  Cardiac Risk Factors include: advanced age (>28men, >3 women);hypertension;male gender;Other (see comment);dyslipidemia, Risk factor comments: CAD, PAD, CKD stage 3     Objective:     Today's Vitals   11/23/23 1129  Weight: 127 lb (57.6 kg)  Height: 5\' 4"  (1.626 m)   Body mass index is 21.8 kg/m.     11/23/2023   11:36 AM 10/10/2023   12:13 PM 10/04/2023   11:26 AM 08/23/2023   11:11 AM 01/03/2023    7:07 AM 12/27/2022   11:23 AM 04/10/2022    3:10 PM  Advanced Directives  Does Patient Have a Medical Advance Directive? No No No Yes No No Yes  Type of Theme park manager;Living will   Living will  Copy of Healthcare Power of Attorney in Chart?     No - copy requested     Would patient like information on creating a medical advance directive?  No - Patient declined No - Patient declined  No - Patient declined      Current Medications (verified) Outpatient Encounter Medications as of 11/23/2023  Medication Sig   aspirin  EC 81 MG tablet Take 1 tablet (81 mg total) by mouth daily. Swallow whole.   oxybutynin  (DITROPAN ) 5 MG tablet Take 1 tablet (5 mg total) by mouth every 8 (eight) hours as needed for bladder spasms.   phenazopyridine  (PYRIDIUM ) 100 MG tablet Take 1 tablet (100 mg total) by mouth 3 (three) times daily as needed for pain.   rosuvastatin  (CRESTOR ) 20 MG tablet Take 1 tablet by mouth once daily   nitroGLYCERIN  (NITROSTAT ) 0.4 MG SL tablet Place 1 tablet (0.4 mg total) under the tongue every 5 (five) minutes as needed for chest pain.   No facility-administered encounter medications on file as of 11/23/2023.    Allergies (verified) Lipitor [atorvastatin]   History: Past Medical History:  Diagnosis Date   (HFpEF) heart failure with preserved ejection fraction (HCC)    Echo 1/23: Inferior AK, mild aortic stenosis (mean 9 mmHg, V-max 210.5 cm/s, DI 0.70), EF 55-60, GR 1 DD, normal RVSF, normal PASP, trivial MR, mild AI, borderline dilation of aortic root (39 mm)   Acid reflux    hiatal hernia   Anemia    Bladder cancer (HCC)    CAD (coronary artery disease)    S/p non-STEMI 1/23 >> s/p CABG  Carotid artery disease (HCC) 08/23/2021   Pre-CABG Dopplers 1/23:R ICA 40-59; L ICA 1-39 // Carotid US  07/20/2023: RICA 40-59; LICA 1-39   CHF (congestive heart failure) (HCC)    Chronic kidney disease (CKD)    Coronary artery disease involving native coronary artery of native heart without angina pectoris 05/16/2022   Inf NSTEMI s/p CABG in 07/2021 (RIMA-LAD, LIMA-OM1, S-OM2)   Diverticulitis    Erectile dysfunction 04/20/2015   Essential hypertension 04/20/2015   History of colon polyps    History of kidney stones    History of  stroke    Noted on brain MRI 07/2021   Hyperlipidemia 05/28/2015   PAD (peripheral artery disease) (HCC)    Pre-CABG Dopplers 1/23: Right ABI 0.65; left ABI 0.82   Skin cancer    Stroke [facility])    noted MRI 2023 pt. unaware   Past Surgical History:  Procedure Laterality Date   APPENDECTOMY     cataract surgery     CORONARY ARTERY BYPASS GRAFT N/A 08/01/2021   Procedure: CORONARY ARTERY BYPASS GRAFTING (CABG) X 3  ,ON PUMP, USING LEFT AND RIGHT INTERNAL MAMMARY ARTERIES, RIGHT AND LEFT ENDOSCOPIC GREATER SAPHENOUS VEIN CONDUITS;  Surgeon: Bartley Lightning, MD;  Location: [facility] OR;  Service: Open Heart Surgery;  Laterality: N/A;   ENDOVEIN HARVEST OF GREATER SAPHENOUS VEIN Right 08/01/2021   Procedure: ENDOVEIN HARVEST OF GREATER SAPHENOUS VEIN;  Surgeon: Bartley Lightning, MD;  Location: [facility] OR;  Service: Open Heart Surgery;  Laterality: Right;   LEFT HEART CATH AND CORONARY ANGIOGRAPHY N/A 07/27/2021   Procedure: LEFT HEART CATH AND CORONARY ANGIOGRAPHY;  Surgeon: Wenona Hamilton, MD;  Location: [facility] INVASIVE CV LAB;  Service: Cardiovascular;  Laterality: N/A;   SKIN CANCER EXCISION     TEE WITHOUT CARDIOVERSION N/A 08/01/2021   Procedure: TRANSESOPHAGEAL ECHOCARDIOGRAM (TEE);  Surgeon: Bartley Lightning, MD;  Location: [facility] OR;  Service: Open Heart Surgery;  Laterality: N/A;   TRANSURETHRAL RESECTION OF BLADDER TUMOR N/A 01/03/2023   Procedure: TRANSURETHRAL RESECTION OF BLADDER TUMOR (TURBT);  Surgeon: Adelbert Homans, MD;  Location: [facility] ORS;  Service: Urology;  Laterality: N/A;  90 MINUTES   TRANSURETHRAL RESECTION OF BLADDER TUMOR N/A 10/10/2023   Procedure: TURBT (TRANSURETHRAL RESECTION OF BLADDER TUMOR);  Surgeon: Adelbert Homans, MD;  Location: [facility] ORS;  Service: Urology;  Laterality: N/A;   TRANSURETHRAL RESECTION OF PROSTATE     Family History  Problem Relation Age of Onset   Stroke Father 12   Sudden death Mother 92       unknown cause   Social History   Socioeconomic  History   Marital status: Married    Spouse name: Not on file   Number of children: 1   Years of education: 12   Highest education level: Not on file  Occupational History   Occupation: Retired  Tobacco Use   Smoking status: Former    Types: Cigars   Smokeless tobacco: Never   Tobacco comments:    Quit 2016  Vaping Use   Vaping status: Never Used  Substance and Sexual Activity   Alcohol use: Yes    Comment: Occasional beer   Drug use: No   Sexual activity: Not Currently  Other Topics Concern   Not on file  Social History Narrative   Lives with wife and daughter lives with them also/2025   Caffeine use: 1-2 cups coffee in the am and 1 glass tea qpm   Social Drivers of Health  Financial Resource Strain: Medium Risk (11/23/2023)   Overall Financial Resource Strain (CARDIA)    Difficulty of Paying Living Expenses: Somewhat hard  Food Insecurity: No Food Insecurity (11/23/2023)   Hunger Vital Sign    Worried About Running Out of Food in the Last Year: Never true    Ran Out of Food in the Last Year: Never true  Transportation Needs: No Transportation Needs (11/23/2023)   PRAPARE - Administrator, Civil Service (Medical): No    Lack of Transportation (Non-Medical): No  Physical Activity: Inactive (11/23/2023)   Exercise Vital Sign    Days of Exercise per Week: 0 days    Minutes of Exercise per Session: 0 min  Stress: No Stress Concern Present (11/23/2023)   [employer] - Occupational Stress Questionnaire    Feeling of Stress : Only a little  Social Connections: Moderately Isolated (11/23/2023)   Social Connection and Isolation Panel [NHANES]    Frequency of Communication with Friends and Family: Once a week    Frequency of Social Gatherings with Friends and Family: Never    Attends Religious Services: 1 to 4 times per year    Active Member of [employer] or Organizations: No    Attends Banker Meetings: Never    Marital Status:  Married    Tobacco Counseling Counseling given: Not Answered Tobacco comments: Quit 2016    Clinical Intake:  Pre-visit preparation completed: Yes  Pain : No/denies pain     BMI - recorded: 21.8 Nutritional Status: BMI of 19-24  Normal Nutritional Risks: None Diabetes: No  Lab Results  Component Value Date   HGBA1C 5.5 07/27/2021   HGBA1C 5.5 07/23/2018     How often do you need to have someone help you when you read instructions, pamphlets, or other written materials from your doctor or pharmacy?: 1 - Never  Interpreter Needed?: No  Information entered by :: Estrella Alcaraz, RMA   Activities of Daily Living     11/23/2023   11:30 AM 10/11/2023   12:25 AM  In your present state of health, do you have any difficulty performing the following activities:  Hearing? 0   Vision? 0   Difficulty concentrating or making decisions? 0   Walking or climbing stairs? 0   Dressing or bathing? 0   Doing errands, shopping? 0 0  Preparing Food and eating ? N   Using the Toilet? N   In the past six months, have you accidently leaked urine? N   Do you have problems with loss of bowel control? N   Managing your Medications? N   Managing your Finances? N   Housekeeping or managing your Housekeeping? N     Patient Care Team: Adelia Homestead, MD as PCP - General (Internal Medicine) Nahser, Lela Purple, MD as PCP - Cardiology (Cardiology)  Indicate any recent Medical Services you may have received from other than [facility] providers in the past year (date may be approximate).     Assessment:    This is a routine wellness examination for [place].  Hearing/Vision screen Hearing Screening - Comments:: Denies hearing difficulties   Vision Screening - Comments:: Denies vision issues.    Goals Addressed               This Visit's Progress     DIET - INCREASE WATER  INTAKE (pt-stated)   On track     Maintain my health.       Depression Screen  11/23/2023   11:42 AM  01/09/2023    1:44 PM 10/10/2022    8:07 AM 04/10/2022    3:12 PM 11/20/2019    2:56 PM 07/23/2018   11:35 AM 04/20/2015    2:51 PM  PHQ 2/9 Scores  PHQ - 2 Score 0 0 0 0 0 0 0  PHQ- 9 Score 1  0   0     Fall Risk     11/23/2023   11:37 AM 01/09/2023    1:44 PM 10/10/2022    8:07 AM 04/10/2022    3:11 PM 11/20/2019    2:56 PM  Fall Risk   Falls in the past year? 0 1 0 0 0  Number falls in past yr: 0 1 0 0 0  Injury with Fall? 0 0 0 0 0  Risk for fall due to : Impaired balance/gait History of fall(s)  No Fall Risks   Follow up Falls prevention discussed;Falls evaluation completed Falls evaluation completed Falls evaluation completed Falls prevention discussed     MEDICARE RISK AT HOME:  Medicare Risk at Home Any stairs in or around the home?: Yes (on back porch) If so, are there any without handrails?: Yes Home free of loose throw rugs in walkways, pet beds, electrical cords, etc?: Yes Adequate lighting in your home to reduce risk of falls?: Yes Life alert?: No Use of a cane, walker or w/c?: Yes (sometimes uses a cane) Grab bars in the bathroom?: Yes Shower chair or bench in shower?: Yes Elevated toilet seat or a handicapped toilet?: Yes  TIMED UP AND GO:  Was the test performed?  No  Cognitive Function: 6CIT completed        11/23/2023   11:38 AM 04/10/2022    3:22 PM  6CIT Screen  What Year? 0 points 0 points  What month? 0 points 0 points  What time? 0 points 0 points  Count back from 20 0 points 0 points  Months in reverse 0 points 0 points  Repeat phrase 0 points 0 points  Total Score 0 points 0 points    Immunizations Immunization History  Administered Date(s) Administered   Influenza, High Dose Seasonal PF 03/29/2018, 04/17/2018   Influenza,inj,Quad PF,6+ Mos 03/28/2017   Influenza-Unspecified 07/19/2023   PFIZER(Purple Top)SARS-COV-2 Vaccination 09/23/2019, 10/14/2019    Screening Tests Health Maintenance  Topic Date Due   DTaP/Tdap/Td (1 - Tdap) Never  done   Pneumonia Vaccine 6+ Years old (1 of 2 - PCV) Never done   Zoster Vaccines- Shingrix (1 of 2) Never done   COVID-19 Vaccine (3 - Pfizer risk series) 11/11/2019   Medicare Annual Wellness (AWV)  04/11/2023   INFLUENZA VACCINE  02/08/2024   HPV VACCINES  Aged Out   Meningococcal B Vaccine  Aged Out    Health Maintenance  Health Maintenance Due  Topic Date Due   DTaP/Tdap/Td (1 - Tdap) Never done   Pneumonia Vaccine 25+ Years old (1 of 2 - PCV) Never done   Zoster Vaccines- Shingrix (1 of 2) Never done   COVID-19 Vaccine (3 - Pfizer risk series) 11/11/2019   Medicare Annual Wellness (AWV)  04/11/2023   Health Maintenance Items Addressed: See Nurse Notes  Additional Screening:  Vision Screening: Recommended annual ophthalmology exams for early detection of glaucoma and other disorders of the eye.  Dental Screening: Recommended annual dental exams for proper oral hygiene  Community Resource Referral / Chronic Care Management: CRR required this visit?  No   CCM required this  visit?  No   Plan:    I have personally reviewed and noted the following in the patient's chart:   Medical and social history Use of alcohol, tobacco or illicit drugs  Current medications and supplements including opioid prescriptions. Patient is not currently taking opioid prescriptions. Functional ability and status Nutritional status Physical activity Advanced directives List of other physicians Hospitalizations, surgeries, and ER visits in previous 12 months Vitals Screenings to include cognitive, depression, and falls Referrals and appointments  In addition, I have reviewed and discussed with patient certain preventive protocols, quality metrics, and best practice recommendations. A written personalized care plan for preventive services as well as general preventive health recommendations were provided to patient.   Deo Mehringer L Ryatt Corsino, CMA   11/23/2023   After Visit Summary: (Mail) Due  to this being a telephonic visit, the after visit summary with patients personalized plan was offered to patient via mail   Notes: Nothing significant to report at this time.

## 2023-12-07 DIAGNOSIS — R001 Bradycardia, unspecified: Secondary | ICD-10-CM | POA: Diagnosis not present

## 2023-12-10 DIAGNOSIS — R001 Bradycardia, unspecified: Secondary | ICD-10-CM

## 2023-12-11 ENCOUNTER — Ambulatory Visit: Payer: Self-pay | Admitting: Student

## 2023-12-11 ENCOUNTER — Ambulatory Visit (HOSPITAL_COMMUNITY)
Admission: RE | Admit: 2023-12-11 | Discharge: 2023-12-11 | Disposition: A | Discharge status: Home / Self Care | Source: Ambulatory Visit | Attending: Cardiovascular Disease | Admitting: Cardiovascular Disease

## 2023-12-11 DIAGNOSIS — I359 Nonrheumatic aortic valve disorder, unspecified: Secondary | ICD-10-CM | POA: Insufficient documentation

## 2023-12-11 DIAGNOSIS — I351 Nonrheumatic aortic (valve) insufficiency: Secondary | ICD-10-CM | POA: Diagnosis not present

## 2023-12-11 DIAGNOSIS — I350 Nonrheumatic aortic (valve) stenosis: Secondary | ICD-10-CM

## 2023-12-11 LAB — ECHOCARDIOGRAM COMPLETE
AR max vel: 1.1 cm2
AV Area VTI: 1.1 cm2
AV Area mean vel: 0.99 cm2
AV Mean grad: 23 mmHg
AV Peak grad: 32.3 mmHg
Ao pk vel: 2.84 m/s
Area-P 1/2: 3.2 cm2
P 1/2 time: 623 ms
S' Lateral: 3.1 cm

## 2023-12-12 ENCOUNTER — Ambulatory Visit (INDEPENDENT_AMBULATORY_CARE_PROVIDER_SITE_OTHER): Admitting: Podiatry

## 2023-12-12 DIAGNOSIS — Z91199 Patient's noncompliance with other medical treatment and regimen due to unspecified reason: Secondary | ICD-10-CM

## 2023-12-12 NOTE — Progress Notes (Signed)
 Cancel 24 hours

## 2023-12-31 ENCOUNTER — Ambulatory Visit (INDEPENDENT_AMBULATORY_CARE_PROVIDER_SITE_OTHER): Admitting: Podiatry

## 2023-12-31 DIAGNOSIS — Z91199 Patient's noncompliance with other medical treatment and regimen due to unspecified reason: Secondary | ICD-10-CM

## 2023-12-31 NOTE — Progress Notes (Signed)
 No show

## 2024-01-22 ENCOUNTER — Ambulatory Visit: Attending: Physician Assistant | Admitting: Physician Assistant

## 2024-01-22 ENCOUNTER — Other Ambulatory Visit: Payer: Self-pay | Admitting: *Deleted

## 2024-01-22 ENCOUNTER — Encounter: Payer: Self-pay | Admitting: Physician Assistant

## 2024-01-22 VITALS — BP 122/64 | HR 74 | Ht 64.0 in | Wt 128.4 lb

## 2024-01-22 DIAGNOSIS — I359 Nonrheumatic aortic valve disorder, unspecified: Secondary | ICD-10-CM | POA: Diagnosis not present

## 2024-01-22 DIAGNOSIS — I2510 Atherosclerotic heart disease of native coronary artery without angina pectoris: Secondary | ICD-10-CM | POA: Diagnosis not present

## 2024-01-22 DIAGNOSIS — E785 Hyperlipidemia, unspecified: Secondary | ICD-10-CM

## 2024-01-22 DIAGNOSIS — R001 Bradycardia, unspecified: Secondary | ICD-10-CM | POA: Diagnosis not present

## 2024-01-22 DIAGNOSIS — I10 Essential (primary) hypertension: Secondary | ICD-10-CM | POA: Diagnosis not present

## 2024-01-22 DIAGNOSIS — N1831 Chronic kidney disease, stage 3a: Secondary | ICD-10-CM | POA: Diagnosis not present

## 2024-01-22 MED ORDER — ROSUVASTATIN CALCIUM 20 MG PO TABS: 20.0000 mg | ORAL_TABLET | Freq: Every day | ORAL | 3 refills | Status: DC

## 2024-01-22 NOTE — Progress Notes (Signed)
 OFFICE NOTE:    Date:  01/22/2024  ID:  Danny [name], DOB Jun 19, 1934, MRN [id number] PCP: [name] LABOR, MD  [facility] Providers Cardiologist:  Aleene Passe, MD        Coronary artery disease Inf NSTEMI s/p CABG in 07/2021 RIMA-LAD, LIMA-OM1, S-OM2 Ischemic CM EF 40-45 by LV gram 1/23 Echocardiogram 07/28/21: EF 55-60, inf AK, mild AS (mean 9), Gr 1 DD, NL RVSF, NL PASP (HFpEF) heart failure with preserved ejection fraction  Aortic stenosis  Mild - Echo 1/23: Mean gradient 9 mmHg, Vmax 210.5 cm/s, DI 0.70 TTE 12/11/2023: EF 50-55, inferior and basal inferoseptal HK, GR 1 DD, mild LAE, trivial MR, mild AI, moderate AS (mean 23, V-max 284 cm/s, DI 0.35) Carotid artery disease US  07/20/23: RICA 40-59; LICA 1-39  Peripheral arterial disease ABIs 1/23: R 0.65; L 0.82 Bradycardia Monitor 11/2023: Minimum HR 42, average HR 72; frequent episodes of SVT (longest 12 beats); 3-second pause x 1; PACs 7.1%; PVCs 2.1% Hypertension  Hyperlipidemia  Myalgias with Atorvastatin  Chronic kidney disease stage III 08/2023: GFR 54 History of CVA (MRI 1/23: Chronic lacunar infarct left thalamus) Hx of syncope GERD ED Borderline dilation of aortic root (39 mm) - Echocardiogram 07/2021  Anemia     Bladder CA s/p TURBT 10/2023      Discussed the use of AI scribe software for clinical note transcription with the patient, who gave verbal consent to proceed. History of Present Illness Danny [name] is a 89 y.o. male returns for follow-up of CAD, CHF.  He was last seen in April 2025.  He had been admitted for bladder procedure and noted to have significant bradycardia.  Beta-blocker was stopped.  Follow-up monitor demonstrated average heart rate of 72.  He had several episodes of SVT that were brief.  The longest was 12 beats.  He had one 3-second pause which likely occurred during sleep.  There were 7.1% PACs and 2.1% PVCs.  Follow-up echocardiogram demonstrated normal EF, moderate  aortic stenosis with mean gradient 23 mmHg.  Of note, he has been referred to nephrology for CKD.  He is here alone.  Since last seen, he has not had chest pain, pressure, shortness of breath, or syncope.  He continues to perform housework and care for his wife full-time. He has not been taking his prescribed aspirin  and rosuvastatin  recently.  I referred him to nephrology for CKD and hyperkalemia. He has not been contacted by the nephrology office yet.  We discussed limiting foods rich in potassium.   ROS-See HPI    Studies Reviewed:               Physical Exam:  VS:  BP 122/64   Pulse 74   Ht 5' 4 (1.626 m)   Wt 128 lb 6.4 oz (58.2 kg)   SpO2 97%   BMI 22.04 kg/m        Wt Readings from Last 3 Encounters:  01/22/24 128 lb 6.4 oz (58.2 kg)  11/23/23 127 lb (57.6 kg)  11/06/23 127 lb 6.4 oz (57.8 kg)    Constitutional:      Appearance: Healthy appearance. Not in distress.  Neck:     Vascular: JVD normal.  Pulmonary:     Breath sounds: Normal breath sounds. No wheezing. No rales.  Cardiovascular:     Normal rate. Regular rhythm.     Murmurs: There is a grade 2/6 systolic murmur at the URSB.  Edema:    Peripheral  edema absent.  Abdominal:     Palpations: Abdomen is soft.       Assessment and Plan:    Assessment & Plan Coronary artery disease involving native coronary artery of native heart without angina pectoris Status post STEMI in 2023 followed by CABG. Currently asymptomatic without anginal symptoms.  He discontinued aspirin  and Crestor  for unclear reasons. - Restart aspirin  81 mg oral daily - Restart Crestor  20 mg oral daily - Continue nitroglycerin  as needed - Obtain CMET and lipids in three months - Follow-up 1 year Aortic valve disease Moderate aortic stenosis with a mean gradient of 23 mmHg on echocardiogram in June 2025. - Arrange follow-up echocardiogram in one year Bradycardia Metoprolol  discontinued.  He did not have evidence of high-grade heart  block or significant pauses on his recent monitor.  Continue to avoid AV nodal blocking agents. Stage 3a chronic kidney disease [facility]) Referral to nephrology pending.  I have provided him with the phone number for [facility].  I have asked him to contact them so that they know how to reach him. Essential hypertension Blood pressure is well-controlled without medication. Hyperlipidemia LDL goal <70 Goal LDL of less than 70 mg/dL.  As noted, he discontinued rosuvastatin  for unclear reasons. - Restart Crestor  20 mg oral daily - Obtain CMET, lipids in three months      Dispo:  Return in about 1 year (around 01/21/2025) for Routine Follow Up, w/ [name], PA-C.  Signed, [name], PA-C

## 2024-01-22 NOTE — Assessment & Plan Note (Signed)
 Blood pressure is well-controlled without medication.

## 2024-01-22 NOTE — Assessment & Plan Note (Signed)
 Metoprolol  discontinued.  He did not have evidence of high-grade heart block or significant pauses on his recent monitor.  Continue to avoid AV nodal blocking agents.

## 2024-01-22 NOTE — Assessment & Plan Note (Signed)
 Referral to nephrology pending.  I have provided him with the phone number for [facility].  I have asked him to contact them so that they know how to reach him.

## 2024-01-22 NOTE — Assessment & Plan Note (Signed)
 Goal LDL of less than 70 mg/dL.  As noted, he discontinued rosuvastatin  for unclear reasons. - Restart Crestor  20 mg oral daily - Obtain CMET, lipids in three months

## 2024-01-22 NOTE — Assessment & Plan Note (Signed)
 Status post STEMI in 2023 followed by CABG. Currently asymptomatic without anginal symptoms.  He discontinued aspirin  and Crestor  for unclear reasons. - Restart aspirin  81 mg oral daily - Restart Crestor  20 mg oral daily - Continue nitroglycerin  as needed - Obtain CMET and lipids in three months - Follow-up 1 year

## 2024-01-22 NOTE — Patient Instructions (Signed)
 Medication Instructions:  Your physician has recommended you make the following change in your medication:   RESTART the Aspirin  81 mg taking 1 daily  RESTART Rosuvastatin  20 mg taking 1 daily  *If you need a refill on your cardiac medications before your next appointment, please call your pharmacy*  Lab Work: IN OCTOBER, COME TO THE LAB, FASTING, FOR:  CMET & LIPID  If you have labs (blood work) drawn today and your tests are completely normal, you will receive your results only by: MyChart Message (if you have MyChart) OR A paper copy in the mail If you have any lab test that is abnormal or we need to change your treatment, we will call you to review the results.  Testing/Procedures: Your physician has requested that you have an echocardiogram. Echocardiography is a painless test that uses sound waves to create images of your heart. It provides your doctor with information about the size and shape of your heart and how well your heart's chambers and valves are working. This procedure takes approximately one hour. There are no restrictions for this procedure. Please do NOT wear cologne, perfume, aftershave, or lotions (deodorant is allowed). Please arrive 15 minutes prior to your appointment time.  Please note: We ask at that you not bring children with you during ultrasound (echo/ vascular) testing. Due to room size and safety concerns, children are not allowed in the ultrasound rooms during exams. Our front office staff cannot provide observation of children in our lobby area while testing is being conducted. An adult accompanying a patient to their appointment will only be allowed in the ultrasound room at the discretion of the ultrasound technician under special circumstances. We apologize for any inconvenience.   Follow-Up: At [facility], you and your health needs are our priority.  As part of our continuing mission to provide you with exceptional heart care, our providers are  all part of one team.  This team includes your primary Cardiologist (physician) and Advanced Practice Providers or APPs (Physician Assistants and Nurse Practitioners) who all work together to provide you with the care you need, when you need it.  Your next appointment:   12 month(s)  Provider:   [name], PA-C          We recommend signing up for the patient portal called MyChart.  Sign up information is provided on this After Visit Summary.  MyChart is used to connect with patients for Virtual Visits (Telemedicine).  Patients are able to view lab/test results, encounter notes, upcoming appointments, etc.  Non-urgent messages can be sent to your provider as well.   To learn more about what you can do with MyChart, go to [email or web address].   Other Instructions  Please call [facility] (kidney doctors) to let them know how to reach you for your referral: [facility] [address] [place], [place]  [address] Phone [phone]

## 2024-01-23 ENCOUNTER — Ambulatory Visit (INDEPENDENT_AMBULATORY_CARE_PROVIDER_SITE_OTHER): Admitting: Podiatry

## 2024-01-23 DIAGNOSIS — Z91199 Patient's noncompliance with other medical treatment and regimen due to unspecified reason: Secondary | ICD-10-CM

## 2024-01-23 NOTE — Progress Notes (Signed)
 No show

## 2024-03-25 ENCOUNTER — Inpatient Hospital Stay (HOSPITAL_BASED_OUTPATIENT_CLINIC_OR_DEPARTMENT_OTHER)
Admission: EM | Admit: 2024-03-25 | Discharge: 2024-04-10 | DRG: 266 | Disposition: A | Discharge status: Home / Self Care | Attending: Internal Medicine | Admitting: Internal Medicine

## 2024-03-25 ENCOUNTER — Other Ambulatory Visit: Payer: Self-pay

## 2024-03-25 ENCOUNTER — Ambulatory Visit: Payer: Self-pay

## 2024-03-25 ENCOUNTER — Emergency Department (HOSPITAL_BASED_OUTPATIENT_CLINIC_OR_DEPARTMENT_OTHER)

## 2024-03-25 ENCOUNTER — Emergency Department (HOSPITAL_BASED_OUTPATIENT_CLINIC_OR_DEPARTMENT_OTHER): Admitting: Radiology

## 2024-03-25 DIAGNOSIS — E871 Hypo-osmolality and hyponatremia: Secondary | ICD-10-CM | POA: Diagnosis present

## 2024-03-25 DIAGNOSIS — Z8673 Personal history of transient ischemic attack (TIA), and cerebral infarction without residual deficits: Secondary | ICD-10-CM

## 2024-03-25 DIAGNOSIS — I2510 Atherosclerotic heart disease of native coronary artery without angina pectoris: Secondary | ICD-10-CM | POA: Diagnosis not present

## 2024-03-25 DIAGNOSIS — I517 Cardiomegaly: Secondary | ICD-10-CM | POA: Diagnosis not present

## 2024-03-25 DIAGNOSIS — D539 Nutritional anemia, unspecified: Secondary | ICD-10-CM | POA: Diagnosis present

## 2024-03-25 DIAGNOSIS — Z23 Encounter for immunization: Secondary | ICD-10-CM

## 2024-03-25 DIAGNOSIS — E872 Acidosis, unspecified: Secondary | ICD-10-CM | POA: Diagnosis present

## 2024-03-25 DIAGNOSIS — I5021 Acute systolic (congestive) heart failure: Secondary | ICD-10-CM | POA: Diagnosis not present

## 2024-03-25 DIAGNOSIS — N390 Urinary tract infection, site not specified: Secondary | ICD-10-CM | POA: Diagnosis not present

## 2024-03-25 DIAGNOSIS — B961 Klebsiella pneumoniae [K. pneumoniae] as the cause of diseases classified elsewhere: Secondary | ICD-10-CM | POA: Diagnosis not present

## 2024-03-25 DIAGNOSIS — I739 Peripheral vascular disease, unspecified: Secondary | ICD-10-CM | POA: Diagnosis present

## 2024-03-25 DIAGNOSIS — I493 Ventricular premature depolarization: Secondary | ICD-10-CM | POA: Diagnosis not present

## 2024-03-25 DIAGNOSIS — N1832 Chronic kidney disease, stage 3b: Secondary | ICD-10-CM | POA: Diagnosis present

## 2024-03-25 DIAGNOSIS — R0902 Hypoxemia: Secondary | ICD-10-CM | POA: Diagnosis not present

## 2024-03-25 DIAGNOSIS — R059 Cough, unspecified: Secondary | ICD-10-CM | POA: Diagnosis not present

## 2024-03-25 DIAGNOSIS — N189 Chronic kidney disease, unspecified: Secondary | ICD-10-CM | POA: Diagnosis not present

## 2024-03-25 DIAGNOSIS — Z006 Encounter for examination for normal comparison and control in clinical research program: Secondary | ICD-10-CM | POA: Diagnosis not present

## 2024-03-25 DIAGNOSIS — I447 Left bundle-branch block, unspecified: Secondary | ICD-10-CM | POA: Diagnosis not present

## 2024-03-25 DIAGNOSIS — R932 Abnormal findings on diagnostic imaging of liver and biliary tract: Secondary | ICD-10-CM | POA: Diagnosis not present

## 2024-03-25 DIAGNOSIS — I428 Other cardiomyopathies: Secondary | ICD-10-CM | POA: Diagnosis present

## 2024-03-25 DIAGNOSIS — R7989 Other specified abnormal findings of blood chemistry: Secondary | ICD-10-CM | POA: Diagnosis present

## 2024-03-25 DIAGNOSIS — E875 Hyperkalemia: Secondary | ICD-10-CM | POA: Diagnosis not present

## 2024-03-25 DIAGNOSIS — K828 Other specified diseases of gallbladder: Secondary | ICD-10-CM | POA: Diagnosis not present

## 2024-03-25 DIAGNOSIS — N179 Acute kidney failure, unspecified: Secondary | ICD-10-CM | POA: Diagnosis present

## 2024-03-25 DIAGNOSIS — I77810 Thoracic aortic ectasia: Secondary | ICD-10-CM | POA: Diagnosis present

## 2024-03-25 DIAGNOSIS — K838 Other specified diseases of biliary tract: Secondary | ICD-10-CM | POA: Diagnosis not present

## 2024-03-25 DIAGNOSIS — Z48813 Encounter for surgical aftercare following surgery on the respiratory system: Secondary | ICD-10-CM | POA: Diagnosis not present

## 2024-03-25 DIAGNOSIS — Z66 Do not resuscitate: Secondary | ICD-10-CM | POA: Diagnosis not present

## 2024-03-25 DIAGNOSIS — I080 Rheumatic disorders of both mitral and aortic valves: Secondary | ICD-10-CM | POA: Diagnosis present

## 2024-03-25 DIAGNOSIS — D649 Anemia, unspecified: Secondary | ICD-10-CM

## 2024-03-25 DIAGNOSIS — Z951 Presence of aortocoronary bypass graft: Secondary | ICD-10-CM

## 2024-03-25 DIAGNOSIS — I351 Nonrheumatic aortic (valve) insufficiency: Secondary | ICD-10-CM | POA: Diagnosis not present

## 2024-03-25 DIAGNOSIS — Z952 Presence of prosthetic heart valve: Secondary | ICD-10-CM | POA: Diagnosis not present

## 2024-03-25 DIAGNOSIS — D696 Thrombocytopenia, unspecified: Secondary | ICD-10-CM

## 2024-03-25 DIAGNOSIS — R0602 Shortness of breath: Secondary | ICD-10-CM | POA: Diagnosis not present

## 2024-03-25 DIAGNOSIS — I252 Old myocardial infarction: Secondary | ICD-10-CM

## 2024-03-25 DIAGNOSIS — I350 Nonrheumatic aortic (valve) stenosis: Secondary | ICD-10-CM

## 2024-03-25 DIAGNOSIS — Z789 Other specified health status: Secondary | ICD-10-CM | POA: Diagnosis not present

## 2024-03-25 DIAGNOSIS — I130 Hypertensive heart and chronic kidney disease with heart failure and stage 1 through stage 4 chronic kidney disease, or unspecified chronic kidney disease: Secondary | ICD-10-CM | POA: Diagnosis present

## 2024-03-25 DIAGNOSIS — Z8551 Personal history of malignant neoplasm of bladder: Secondary | ICD-10-CM

## 2024-03-25 DIAGNOSIS — Z87891 Personal history of nicotine dependence: Secondary | ICD-10-CM

## 2024-03-25 DIAGNOSIS — Z888 Allergy status to other drugs, medicaments and biological substances status: Secondary | ICD-10-CM

## 2024-03-25 DIAGNOSIS — Z85828 Personal history of other malignant neoplasm of skin: Secondary | ICD-10-CM

## 2024-03-25 DIAGNOSIS — R570 Cardiogenic shock: Secondary | ICD-10-CM | POA: Diagnosis present

## 2024-03-25 DIAGNOSIS — Z1152 Encounter for screening for COVID-19: Secondary | ICD-10-CM | POA: Diagnosis not present

## 2024-03-25 DIAGNOSIS — I5033 Acute on chronic diastolic (congestive) heart failure: Secondary | ICD-10-CM | POA: Diagnosis not present

## 2024-03-25 DIAGNOSIS — Z7982 Long term (current) use of aspirin: Secondary | ICD-10-CM

## 2024-03-25 DIAGNOSIS — R54 Age-related physical debility: Secondary | ICD-10-CM | POA: Diagnosis present

## 2024-03-25 DIAGNOSIS — I10 Essential (primary) hypertension: Secondary | ICD-10-CM | POA: Diagnosis present

## 2024-03-25 DIAGNOSIS — Z860100 Personal history of colon polyps, unspecified: Secondary | ICD-10-CM

## 2024-03-25 DIAGNOSIS — Z515 Encounter for palliative care: Secondary | ICD-10-CM

## 2024-03-25 DIAGNOSIS — R7401 Elevation of levels of liver transaminase levels: Secondary | ICD-10-CM | POA: Diagnosis present

## 2024-03-25 DIAGNOSIS — Z9079 Acquired absence of other genital organ(s): Secondary | ICD-10-CM

## 2024-03-25 DIAGNOSIS — N281 Cyst of kidney, acquired: Secondary | ICD-10-CM | POA: Diagnosis not present

## 2024-03-25 DIAGNOSIS — E785 Hyperlipidemia, unspecified: Secondary | ICD-10-CM | POA: Diagnosis present

## 2024-03-25 DIAGNOSIS — R1900 Intra-abdominal and pelvic swelling, mass and lump, unspecified site: Secondary | ICD-10-CM | POA: Diagnosis present

## 2024-03-25 DIAGNOSIS — Z9049 Acquired absence of other specified parts of digestive tract: Secondary | ICD-10-CM

## 2024-03-25 DIAGNOSIS — I5030 Unspecified diastolic (congestive) heart failure: Secondary | ICD-10-CM | POA: Diagnosis present

## 2024-03-25 DIAGNOSIS — D6959 Other secondary thrombocytopenia: Secondary | ICD-10-CM | POA: Diagnosis present

## 2024-03-25 DIAGNOSIS — N1831 Chronic kidney disease, stage 3a: Secondary | ICD-10-CM | POA: Diagnosis present

## 2024-03-25 DIAGNOSIS — K7200 Acute and subacute hepatic failure without coma: Secondary | ICD-10-CM | POA: Diagnosis present

## 2024-03-25 DIAGNOSIS — J90 Pleural effusion, not elsewhere classified: Secondary | ICD-10-CM

## 2024-03-25 DIAGNOSIS — R531 Weakness: Secondary | ICD-10-CM

## 2024-03-25 DIAGNOSIS — J811 Chronic pulmonary edema: Secondary | ICD-10-CM | POA: Diagnosis not present

## 2024-03-25 DIAGNOSIS — C679 Malignant neoplasm of bladder, unspecified: Secondary | ICD-10-CM | POA: Diagnosis not present

## 2024-03-25 DIAGNOSIS — I959 Hypotension, unspecified: Secondary | ICD-10-CM | POA: Diagnosis not present

## 2024-03-25 DIAGNOSIS — R630 Anorexia: Secondary | ICD-10-CM | POA: Diagnosis present

## 2024-03-25 DIAGNOSIS — J40 Bronchitis, not specified as acute or chronic: Secondary | ICD-10-CM | POA: Diagnosis not present

## 2024-03-25 DIAGNOSIS — Z01818 Encounter for other preprocedural examination: Secondary | ICD-10-CM | POA: Diagnosis not present

## 2024-03-25 DIAGNOSIS — I249 Acute ischemic heart disease, unspecified: Principal | ICD-10-CM

## 2024-03-25 DIAGNOSIS — Z79899 Other long term (current) drug therapy: Secondary | ICD-10-CM

## 2024-03-25 DIAGNOSIS — I5023 Acute on chronic systolic (congestive) heart failure: Secondary | ICD-10-CM | POA: Diagnosis not present

## 2024-03-25 DIAGNOSIS — I255 Ischemic cardiomyopathy: Secondary | ICD-10-CM | POA: Diagnosis present

## 2024-03-25 DIAGNOSIS — I441 Atrioventricular block, second degree: Secondary | ICD-10-CM | POA: Diagnosis present

## 2024-03-25 DIAGNOSIS — I2489 Other forms of acute ischemic heart disease: Secondary | ICD-10-CM | POA: Diagnosis present

## 2024-03-25 DIAGNOSIS — I5022 Chronic systolic (congestive) heart failure: Secondary | ICD-10-CM | POA: Diagnosis not present

## 2024-03-25 DIAGNOSIS — Z7189 Other specified counseling: Secondary | ICD-10-CM | POA: Diagnosis not present

## 2024-03-25 DIAGNOSIS — I700 Atherosclerosis of aorta: Secondary | ICD-10-CM | POA: Diagnosis not present

## 2024-03-25 DIAGNOSIS — R4589 Other symptoms and signs involving emotional state: Secondary | ICD-10-CM | POA: Diagnosis not present

## 2024-03-25 DIAGNOSIS — I2585 Chronic coronary microvascular dysfunction: Secondary | ICD-10-CM | POA: Diagnosis not present

## 2024-03-25 DIAGNOSIS — Z6821 Body mass index (BMI) 21.0-21.9, adult: Secondary | ICD-10-CM

## 2024-03-25 DIAGNOSIS — I340 Nonrheumatic mitral (valve) insufficiency: Secondary | ICD-10-CM | POA: Diagnosis not present

## 2024-03-25 DIAGNOSIS — I5043 Acute on chronic combined systolic (congestive) and diastolic (congestive) heart failure: Secondary | ICD-10-CM | POA: Diagnosis present

## 2024-03-25 DIAGNOSIS — Z87442 Personal history of urinary calculi: Secondary | ICD-10-CM

## 2024-03-25 DIAGNOSIS — I25810 Atherosclerosis of coronary artery bypass graft(s) without angina pectoris: Secondary | ICD-10-CM | POA: Diagnosis not present

## 2024-03-25 DIAGNOSIS — Z723 Lack of physical exercise: Secondary | ICD-10-CM

## 2024-03-25 DIAGNOSIS — Z452 Encounter for adjustment and management of vascular access device: Secondary | ICD-10-CM | POA: Diagnosis not present

## 2024-03-25 DIAGNOSIS — R918 Other nonspecific abnormal finding of lung field: Secondary | ICD-10-CM | POA: Diagnosis not present

## 2024-03-25 DIAGNOSIS — Z743 Need for continuous supervision: Secondary | ICD-10-CM | POA: Diagnosis not present

## 2024-03-25 LAB — CBC
HCT: 22 % — ABNORMAL LOW (ref 39.0–52.0)
Hemoglobin: 7.2 g/dL — ABNORMAL LOW (ref 13.0–17.0)
MCH: 36.7 pg — ABNORMAL HIGH (ref 26.0–34.0)
MCHC: 32.7 g/dL (ref 30.0–36.0)
MCV: 112.2 fL — ABNORMAL HIGH (ref 80.0–100.0)
Platelets: 121 K/uL — ABNORMAL LOW (ref 150–400)
RBC: 1.96 MIL/uL — ABNORMAL LOW (ref 4.22–5.81)
RDW: 16.5 % — ABNORMAL HIGH (ref 11.5–15.5)
WBC: 6.1 K/uL (ref 4.0–10.5)
nRBC: 1.5 % — ABNORMAL HIGH (ref 0.0–0.2)

## 2024-03-25 LAB — RESP PANEL BY RT-PCR (RSV, FLU A&B, COVID)  RVPGX2
Influenza A by PCR: NEGATIVE
Influenza B by PCR: NEGATIVE
Resp Syncytial Virus by PCR: NEGATIVE
SARS Coronavirus 2 by RT PCR: NEGATIVE

## 2024-03-25 LAB — HEPATIC FUNCTION PANEL
ALT: 207 U/L — ABNORMAL HIGH (ref 0–44)
AST: 205 U/L — ABNORMAL HIGH (ref 15–41)
Albumin: 3.7 g/dL (ref 3.5–5.0)
Alkaline Phosphatase: 141 U/L — ABNORMAL HIGH (ref 38–126)
Bilirubin, Direct: 0.6 mg/dL — ABNORMAL HIGH (ref 0.0–0.2)
Indirect Bilirubin: 0.6 mg/dL (ref 0.3–0.9)
Total Bilirubin: 1.2 mg/dL (ref 0.0–1.2)
Total Protein: 7 g/dL (ref 6.5–8.1)

## 2024-03-25 LAB — OCCULT BLOOD X 1 CARD TO LAB, STOOL: Fecal Occult Bld: NEGATIVE

## 2024-03-25 LAB — BASIC METABOLIC PANEL WITH GFR
Anion gap: 17 — ABNORMAL HIGH (ref 5–15)
BUN: 63 mg/dL — ABNORMAL HIGH (ref 8–23)
CO2: 18 mmol/L — ABNORMAL LOW (ref 22–32)
Calcium: 9.1 mg/dL (ref 8.9–10.3)
Chloride: 98 mmol/L (ref 98–111)
Creatinine, Ser: 2.2 mg/dL — ABNORMAL HIGH (ref 0.61–1.24)
GFR, Estimated: 28 mL/min — ABNORMAL LOW (ref >60)
Glucose, Bld: 126 mg/dL — ABNORMAL HIGH (ref 70–99)
Potassium: 5.4 mmol/L — ABNORMAL HIGH (ref 3.5–5.1)
Sodium: 133 mmol/L — ABNORMAL LOW (ref 135–145)

## 2024-03-25 LAB — TROPONIN T, HIGH SENSITIVITY
Troponin T High Sensitivity: 375 ng/L (ref 0–19)
Troponin T High Sensitivity: 360 ng/L (ref 0–19)

## 2024-03-25 LAB — LACTIC ACID, PLASMA
Lactic Acid, Venous: 2.2 mmol/L (ref 0.5–1.9)
Lactic Acid, Venous: 2.3 mmol/L (ref 0.5–1.9)

## 2024-03-25 LAB — LIPASE, BLOOD: Lipase: 100 U/L — ABNORMAL HIGH (ref 11–51)

## 2024-03-25 MED ORDER — SODIUM CHLORIDE 0.9% IV SOLUTION: Freq: Once | INTRAVENOUS | Status: AC

## 2024-03-25 MED ORDER — ACETAMINOPHEN 325 MG PO TABS
650.0000 mg | ORAL_TABLET | Freq: Once | ORAL | Status: AC
  Administered 2024-03-26: 650 mg via ORAL

## 2024-03-25 MED ORDER — LACTATED RINGERS IV SOLN: INTRAVENOUS | Status: DC

## 2024-03-25 MED ORDER — LACTATED RINGERS IV BOLUS
250.0000 mL | Freq: Once | INTRAVENOUS | Status: AC
  Administered 2024-03-25: 250 mL via INTRAVENOUS

## 2024-03-25 NOTE — ED Triage Notes (Signed)
 Cough SOB x1 week. Afebrile. Hx CABG 2023. No swelling noted on body.

## 2024-03-25 NOTE — ED Provider Notes (Signed)
 Patient transferred from [facility] for symptomatic anemia, elevated troponins. Patient does report that he has been feeling weak in unwell since Friday. No complaints of pain at time of ED assessment. He is breathing comfortably on room air. Discussed risk and benefits of transfusion and patient is in agreement. Will order one unit PRBC. Medicine consulted for admission for ongoing care.   Griselda Norris, MD 03/26/24 [phone]

## 2024-03-25 NOTE — ED Notes (Signed)
 ED Provider at bedside.

## 2024-03-25 NOTE — Telephone Encounter (Signed)
 FYI Only or Action Required?: Action required by provider: request for appointment.  Patient was last seen in primary care on 01/09/2023 by Norleen Lynwood ORN, MD.  Called Nurse Triage reporting Cough.  Symptoms began several days ago.  Interventions attempted: Rest, hydration, or home remedies.  Symptoms are: gradually worsening. Non-productive cough, fatigue. Not eating, drinking well. Still voiding without difficulty. No fever. Appointment made. Will call 911 for worsening of symptoms.  Triage Disposition: See Physician Within 24 Hours  Patient/caregiver understands and will follow disposition?: Yes   Copied from CRM [id number]. Topic: Clinical - Red Word Triage >> Mar 25, 2024  3:46 PM Robinson H wrote: [facility] that prompted transfer to Nurse Triage: Sick cough, extreme fatigue, can barley walk and today he can't get out of bed. Oxygen yesterday was 75-80 Reason for Disposition  [1] Continuous (nonstop) coughing interferes with work or school AND [2] no improvement using cough treatment per Care Advice  Answer Assessment - Initial Assessment Questions 1. ONSET: When did the cough begin?      Last weekend 2. SEVERITY: How bad is the cough today?      severe 3. SPUTUM: Describe the color of your sputum (e.g., none, dry cough; clear, white, yellow, green)     none 4. HEMOPTYSIS: Are you coughing up any blood? If Yes, ask: How much? (e.g., flecks, streaks, tablespoons, etc.)     no 5. DIFFICULTY BREATHING: Are you having difficulty breathing? If Yes, ask: How bad is it? (e.g., mild, moderate, severe)      mild 6. FEVER: Do you have a fever? If Yes, ask: What is your temperature, how was it measured, and when did it start?     no 7. CARDIAC HISTORY: Do you have any history of heart disease? (e.g., heart attack, congestive heart failure)      yes 8. LUNG HISTORY: Do you have any history of lung disease?  (e.g., pulmonary embolus, asthma, emphysema)     yes 9. PE  RISK FACTORS: Do you have a history of blood clots? (or: recent major surgery, recent prolonged travel, bedridden)     yes 10. OTHER SYMPTOMS: Do you have any other symptoms? (e.g., runny nose, wheezing, chest pain)       No 11. PREGNANCY: Is there any chance you are pregnant? When was your last menstrual period?       no 12. TRAVEL: Have you traveled out of the country in the last month? (e.g., travel history, exposures)       no  Protocols used: Cough - Acute Non-Productive-A-AH

## 2024-03-25 NOTE — ED Provider Notes (Signed)
 [place] EMERGENCY DEPARTMENT AT [facility] Provider Note   CSN: [id number] Arrival date & time: 03/25/24  1843     Patient presents with: No chief complaint on file.   Danny [name] is a 89 y.o. male.   HPI Patient and daughter are historians.  Reportedly the patient has been unwell since Friday, 4 days ago.  Symptoms have been significant fatigue, significant loss of appetite, exertional shortness of breath.  Patient does not endorse chest pain but also describes a feeling like his chest was tight and congested but could not cough anything up.  Patient also notes he had intermittently sensation of tightness or discomfort around his lower abdomen.  Patient's daughter reports that he has eaten very little over the weekend.  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  Patient's daughter reports she got extremely concerned today when the patient was essentially too weak to get up.  Patient denies any fever they have documented.  He has not had vomiting or diarrhea.  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    Prior to Admission medications   Medication Sig Start Date End Date Taking? Authorizing Provider  aspirin  EC 81 MG tablet Take 1 tablet (81 mg total) by mouth daily. Swallow whole. 11/06/23   Lelon Hamilton T, PA-C  nitroGLYCERIN  (NITROSTAT ) 0.4 MG SL tablet Place 1 tablet (0.4 mg total) under the tongue every 5 (five) minutes as needed for chest pain. 05/16/22 01/22/24  Lelon Hamilton T, PA-C  oxybutynin  (DITROPAN ) 5 MG tablet Take 1 tablet (5 mg total) by mouth every 8 (eight) hours as needed for bladder spasms. 10/10/23   Devere Lonni Righter, MD  phenazopyridine  (PYRIDIUM ) 100 MG tablet Take 1 tablet (100 mg total) by mouth 3 (three) times daily as needed for pain. 10/10/23 10/09/24  Devere Lonni Righter, MD  rosuvastatin  (CRESTOR ) 20 MG tablet Take 1 tablet (20 mg total) by mouth daily. 01/22/24   Lelon Hamilton T, PA-C     Allergies: Lipitor [atorvastatin]    Review of Systems  Updated Vital Signs BP (!) 117/97   Pulse (!) 35   Temp (!) 97.5 F (36.4 C)   Resp 17   SpO2 96%   Physical Exam Constitutional:      Comments: Patient is frail and pale in appearance.  Mental status is clear.  Mild increased work of breathing at rest.  HENT:     Head: Normocephalic and atraumatic.     Mouth/Throat:     Pharynx: Oropharynx is clear.  Eyes:     Extraocular Movements: Extraocular movements intact.  Cardiovascular:     Comments: Heart is irregular.  3 out of 6 systolic ejection murmur.  Positive JVD. Pulmonary:     Comments: Breath sounds are soft but grossly clear.  No crackles or wheeze. Abdominal:     General: There is no distension.     Palpations: Abdomen is soft.     Tenderness: There is no abdominal tenderness. There is no guarding.  Genitourinary:    Comments: Stool brown.  No melena. Musculoskeletal:     Comments: No significant peripheral edema.  Calves are soft and pliable.  Skin:    General: Skin is warm and dry.     Coloration: Skin is pale.  Neurological:     General: No focal deficit present.     Mental Status: He is oriented to person, place, and time.     Comments: Patient is generally  weak but no focal dysfunction.  Patient can follow commands.  Speech is situationally appropriate and oriented.     (all labs ordered are listed, but only abnormal results are displayed) Labs Reviewed  BASIC METABOLIC PANEL WITH GFR - Abnormal; Notable for the following components:      Result Value   Sodium 133 (*)    Potassium 5.4 (*)    CO2 18 (*)    Glucose, Bld 126 (*)    BUN 63 (*)    Creatinine, Ser 2.20 (*)    GFR, Estimated 28 (*)    Anion gap 17 (*)    All other components within normal limits  CBC - Abnormal; Notable for the following components:   RBC 1.96 (*)    Hemoglobin 7.2 (*)    HCT 22.0 (*)    MCV 112.2 (*)    MCH 36.7 (*)    RDW 16.5 (*)    Platelets 121 (*)     nRBC 1.5 (*)    All other components within normal limits  LIPASE, BLOOD - Abnormal; Notable for the following components:   Lipase 100 (*)    All other components within normal limits  HEPATIC FUNCTION PANEL - Abnormal; Notable for the following components:   AST 205 (*)    ALT 207 (*)    Alkaline Phosphatase 141 (*)    Bilirubin, Direct 0.6 (*)    All other components within normal limits  TROPONIN T, HIGH SENSITIVITY - Abnormal; Notable for the following components:   Troponin T High Sensitivity 375 (*)    All other components within normal limits  RESP PANEL BY RT-PCR (RSV, FLU A&B, COVID)  RVPGX2  LACTIC ACID, PLASMA  LACTIC ACID, PLASMA  OCCULT BLOOD X 1 CARD TO LAB, STOOL  TROPONIN T, HIGH SENSITIVITY    EKG: None  Radiology: DG Chest Portable 1 View Result Date: 03/25/2024 CLINICAL DATA:  SOB EXAM: PORTABLE CHEST 1 VIEW COMPARISON:  Chest x-ray 09/07/2021 FINDINGS: The heart and mediastinal contours are unchanged. Atherosclerotic plaque. Surgical changes noted overlying the mediastinum. Left costophrenic angle collimated off view. No focal consolidation. Mild pulmonary edema. No pleural effusion. No pneumothorax. No acute osseous abnormality.  Intact sternotomy wires. IMPRESSION: 1. Mild pulmonary edema. 2.  Aortic Atherosclerosis (ICD10-I70.0). 3. Left costophrenic angle collimated off view. Electronically Signed   By: Morgane  Naveau M.D.   On: 03/25/2024 19:29     Procedures  CRITICAL CARE Performed by: Ludivina Shines   Total critical care time: 30 minutes  Critical care time was exclusive of separately billable procedures and treating other patients.  Critical care was necessary to treat or prevent imminent or life-threatening deterioration.  Critical care was time spent personally by me on the following activities: development of treatment plan with patient and/or surrogate as well as nursing, discussions with consultants, evaluation of patient's response to  treatment, examination of patient, obtaining history from patient or surrogate, ordering and performing treatments and interventions, ordering and review of laboratory studies, ordering and review of radiographic studies, pulse oximetry and re-evaluation of patient's condition.  Medications Ordered in the ED  lactated ringers  infusion ( Intravenous New Bag/Given 03/25/24 2055)  lactated ringers  bolus 250 mL (0 mLs Intravenous Stopped 03/25/24 2055)                                    Medical Decision Making Amount and/or Complexity of Data Reviewed  Labs: ordered. Radiology: ordered.  Risk Prescription drug management.   Patient presents as outlined.  He has had approximately 4 days of weakness and shortness of breath.  At this time blood pressures are normotensive.  Oxygen saturation 100%.  Differential diagnosis includes acute infectious illness\dehydration\ACS.  Will proceed with broad diagnostic evaluation.  EKG reviewed by myself shows left bundle branch with some increased appearance of repolarization abnormality suggestive of possible ischemia compared to previous.  Consult:Cardiology Dr.Osude at this time patient has complex medical case with anemia and troponin elevation in the 300s.  Not appropriate for immediate cardiology intervention at this time.  Recommends medical admission and stabilization and cardiology consultation.  Consult: EDP Dr.Scheving accepts for ED to ED transfer to [facility].  Patient needs transfer for blood transfusion in the setting of acute coronary syndrome likely demand ischemia from anemia.     Final diagnoses:  ACS (acute coronary syndrome) (HCC)  Symptomatic anemia  AKI (acute kidney injury) [facility])    ED Discharge Orders     None          Armenta Canning, MD 03/25/24 2059

## 2024-03-25 NOTE — Progress Notes (Deleted)
    Subjective:    Patient ID: Danny [name], male    DOB: 1934-05-22, 89 y.o.   MRN: [id number]      HPI Giovannie is here for No chief complaint on file.        Medications and allergies reviewed with patient and updated if appropriate.  Current Facility-Administered Medications on File Prior to Visit  Medication Dose Route Frequency Provider Last Rate Last Admin   lactated ringers  bolus 250 mL  250 mL Intravenous Once Armenta Canning, MD       lactated ringers  infusion   Intravenous Continuous Armenta Canning, MD       Current Outpatient Medications on File Prior to Visit  Medication Sig Dispense Refill   aspirin  EC 81 MG tablet Take 1 tablet (81 mg total) by mouth daily. Swallow whole. 30 tablet 11   nitroGLYCERIN  (NITROSTAT ) 0.4 MG SL tablet Place 1 tablet (0.4 mg total) under the tongue every 5 (five) minutes as needed for chest pain. 25 tablet 3   oxybutynin  (DITROPAN ) 5 MG tablet Take 1 tablet (5 mg total) by mouth every 8 (eight) hours as needed for bladder spasms. 30 tablet 1   phenazopyridine  (PYRIDIUM ) 100 MG tablet Take 1 tablet (100 mg total) by mouth 3 (three) times daily as needed for pain. 30 tablet 1   rosuvastatin  (CRESTOR ) 20 MG tablet Take 1 tablet (20 mg total) by mouth daily. 90 tablet 3    Review of Systems     Objective:  There were no vitals filed for this visit. BP Readings from Last 3 Encounters:  03/25/24 112/67  01/22/24 122/64  11/06/23 (!) 140/70   Wt Readings from Last 3 Encounters:  01/22/24 128 lb 6.4 oz (58.2 kg)  11/23/23 127 lb (57.6 kg)  11/06/23 127 lb 6.4 oz (57.8 kg)   There is no height or weight on file to calculate BMI.    Physical Exam         Assessment & Plan:    See Problem List for Assessment and Plan of chronic medical problems.

## 2024-03-25 NOTE — ED Notes (Signed)
 [facility] at bedside

## 2024-03-26 ENCOUNTER — Inpatient Hospital Stay (HOSPITAL_COMMUNITY)

## 2024-03-26 ENCOUNTER — Encounter (HOSPITAL_COMMUNITY): Payer: Self-pay | Admitting: Internal Medicine

## 2024-03-26 ENCOUNTER — Ambulatory Visit: Admitting: Internal Medicine

## 2024-03-26 ENCOUNTER — Other Ambulatory Visit: Payer: Self-pay

## 2024-03-26 DIAGNOSIS — I2489 Other forms of acute ischemic heart disease: Secondary | ICD-10-CM | POA: Insufficient documentation

## 2024-03-26 DIAGNOSIS — R570 Cardiogenic shock: Secondary | ICD-10-CM

## 2024-03-26 DIAGNOSIS — B961 Klebsiella pneumoniae [K. pneumoniae] as the cause of diseases classified elsewhere: Secondary | ICD-10-CM | POA: Diagnosis not present

## 2024-03-26 DIAGNOSIS — E872 Acidosis, unspecified: Secondary | ICD-10-CM | POA: Diagnosis not present

## 2024-03-26 DIAGNOSIS — I2510 Atherosclerotic heart disease of native coronary artery without angina pectoris: Secondary | ICD-10-CM | POA: Diagnosis not present

## 2024-03-26 DIAGNOSIS — E875 Hyperkalemia: Secondary | ICD-10-CM | POA: Diagnosis not present

## 2024-03-26 DIAGNOSIS — R7401 Elevation of levels of liver transaminase levels: Secondary | ICD-10-CM | POA: Diagnosis not present

## 2024-03-26 DIAGNOSIS — J90 Pleural effusion, not elsewhere classified: Secondary | ICD-10-CM | POA: Diagnosis not present

## 2024-03-26 DIAGNOSIS — Z66 Do not resuscitate: Secondary | ICD-10-CM | POA: Diagnosis not present

## 2024-03-26 DIAGNOSIS — Z23 Encounter for immunization: Secondary | ICD-10-CM | POA: Diagnosis not present

## 2024-03-26 DIAGNOSIS — I739 Peripheral vascular disease, unspecified: Secondary | ICD-10-CM | POA: Diagnosis not present

## 2024-03-26 DIAGNOSIS — I340 Nonrheumatic mitral (valve) insufficiency: Secondary | ICD-10-CM | POA: Diagnosis not present

## 2024-03-26 DIAGNOSIS — I5033 Acute on chronic diastolic (congestive) heart failure: Secondary | ICD-10-CM

## 2024-03-26 DIAGNOSIS — N1831 Chronic kidney disease, stage 3a: Secondary | ICD-10-CM | POA: Diagnosis not present

## 2024-03-26 DIAGNOSIS — I441 Atrioventricular block, second degree: Secondary | ICD-10-CM | POA: Diagnosis present

## 2024-03-26 DIAGNOSIS — E871 Hypo-osmolality and hyponatremia: Secondary | ICD-10-CM | POA: Diagnosis not present

## 2024-03-26 DIAGNOSIS — Z951 Presence of aortocoronary bypass graft: Secondary | ICD-10-CM

## 2024-03-26 DIAGNOSIS — I517 Cardiomegaly: Secondary | ICD-10-CM | POA: Diagnosis not present

## 2024-03-26 DIAGNOSIS — K828 Other specified diseases of gallbladder: Secondary | ICD-10-CM | POA: Diagnosis not present

## 2024-03-26 DIAGNOSIS — I249 Acute ischemic heart disease, unspecified: Secondary | ICD-10-CM | POA: Diagnosis not present

## 2024-03-26 DIAGNOSIS — I700 Atherosclerosis of aorta: Secondary | ICD-10-CM | POA: Diagnosis not present

## 2024-03-26 DIAGNOSIS — K838 Other specified diseases of biliary tract: Secondary | ICD-10-CM | POA: Diagnosis not present

## 2024-03-26 DIAGNOSIS — Z8551 Personal history of malignant neoplasm of bladder: Secondary | ICD-10-CM

## 2024-03-26 DIAGNOSIS — D696 Thrombocytopenia, unspecified: Secondary | ICD-10-CM | POA: Diagnosis not present

## 2024-03-26 DIAGNOSIS — I5021 Acute systolic (congestive) heart failure: Secondary | ICD-10-CM

## 2024-03-26 DIAGNOSIS — I252 Old myocardial infarction: Secondary | ICD-10-CM | POA: Diagnosis not present

## 2024-03-26 DIAGNOSIS — N281 Cyst of kidney, acquired: Secondary | ICD-10-CM | POA: Diagnosis not present

## 2024-03-26 DIAGNOSIS — D649 Anemia, unspecified: Secondary | ICD-10-CM | POA: Diagnosis not present

## 2024-03-26 DIAGNOSIS — N390 Urinary tract infection, site not specified: Secondary | ICD-10-CM | POA: Diagnosis not present

## 2024-03-26 DIAGNOSIS — Z452 Encounter for adjustment and management of vascular access device: Secondary | ICD-10-CM | POA: Diagnosis not present

## 2024-03-26 DIAGNOSIS — N1832 Chronic kidney disease, stage 3b: Secondary | ICD-10-CM | POA: Diagnosis present

## 2024-03-26 DIAGNOSIS — R531 Weakness: Secondary | ICD-10-CM | POA: Insufficient documentation

## 2024-03-26 DIAGNOSIS — D539 Nutritional anemia, unspecified: Secondary | ICD-10-CM | POA: Diagnosis not present

## 2024-03-26 DIAGNOSIS — I255 Ischemic cardiomyopathy: Secondary | ICD-10-CM | POA: Diagnosis present

## 2024-03-26 DIAGNOSIS — R7989 Other specified abnormal findings of blood chemistry: Secondary | ICD-10-CM | POA: Insufficient documentation

## 2024-03-26 DIAGNOSIS — I77810 Thoracic aortic ectasia: Secondary | ICD-10-CM | POA: Diagnosis present

## 2024-03-26 DIAGNOSIS — Z515 Encounter for palliative care: Secondary | ICD-10-CM | POA: Diagnosis not present

## 2024-03-26 DIAGNOSIS — N179 Acute kidney failure, unspecified: Secondary | ICD-10-CM | POA: Insufficient documentation

## 2024-03-26 DIAGNOSIS — Z48813 Encounter for surgical aftercare following surgery on the respiratory system: Secondary | ICD-10-CM | POA: Diagnosis not present

## 2024-03-26 DIAGNOSIS — K7200 Acute and subacute hepatic failure without coma: Secondary | ICD-10-CM | POA: Diagnosis not present

## 2024-03-26 DIAGNOSIS — I5022 Chronic systolic (congestive) heart failure: Secondary | ICD-10-CM | POA: Diagnosis not present

## 2024-03-26 DIAGNOSIS — I080 Rheumatic disorders of both mitral and aortic valves: Secondary | ICD-10-CM | POA: Diagnosis not present

## 2024-03-26 DIAGNOSIS — I25810 Atherosclerosis of coronary artery bypass graft(s) without angina pectoris: Secondary | ICD-10-CM | POA: Diagnosis not present

## 2024-03-26 DIAGNOSIS — E785 Hyperlipidemia, unspecified: Secondary | ICD-10-CM | POA: Diagnosis present

## 2024-03-26 DIAGNOSIS — J40 Bronchitis, not specified as acute or chronic: Secondary | ICD-10-CM | POA: Diagnosis not present

## 2024-03-26 DIAGNOSIS — C679 Malignant neoplasm of bladder, unspecified: Secondary | ICD-10-CM | POA: Diagnosis not present

## 2024-03-26 DIAGNOSIS — I5023 Acute on chronic systolic (congestive) heart failure: Secondary | ICD-10-CM | POA: Diagnosis not present

## 2024-03-26 DIAGNOSIS — I428 Other cardiomyopathies: Secondary | ICD-10-CM | POA: Diagnosis present

## 2024-03-26 DIAGNOSIS — N189 Chronic kidney disease, unspecified: Secondary | ICD-10-CM | POA: Diagnosis not present

## 2024-03-26 DIAGNOSIS — Z01818 Encounter for other preprocedural examination: Secondary | ICD-10-CM | POA: Diagnosis not present

## 2024-03-26 DIAGNOSIS — R932 Abnormal findings on diagnostic imaging of liver and biliary tract: Secondary | ICD-10-CM | POA: Diagnosis not present

## 2024-03-26 DIAGNOSIS — I10 Essential (primary) hypertension: Secondary | ICD-10-CM | POA: Diagnosis not present

## 2024-03-26 DIAGNOSIS — I350 Nonrheumatic aortic (valve) stenosis: Secondary | ICD-10-CM | POA: Diagnosis not present

## 2024-03-26 DIAGNOSIS — I2585 Chronic coronary microvascular dysfunction: Secondary | ICD-10-CM | POA: Diagnosis not present

## 2024-03-26 DIAGNOSIS — Z006 Encounter for examination for normal comparison and control in clinical research program: Secondary | ICD-10-CM | POA: Diagnosis not present

## 2024-03-26 DIAGNOSIS — D6959 Other secondary thrombocytopenia: Secondary | ICD-10-CM | POA: Diagnosis present

## 2024-03-26 DIAGNOSIS — I130 Hypertensive heart and chronic kidney disease with heart failure and stage 1 through stage 4 chronic kidney disease, or unspecified chronic kidney disease: Secondary | ICD-10-CM | POA: Diagnosis not present

## 2024-03-26 DIAGNOSIS — Z1152 Encounter for screening for COVID-19: Secondary | ICD-10-CM | POA: Diagnosis not present

## 2024-03-26 DIAGNOSIS — Z7189 Other specified counseling: Secondary | ICD-10-CM | POA: Diagnosis not present

## 2024-03-26 DIAGNOSIS — I351 Nonrheumatic aortic (valve) insufficiency: Secondary | ICD-10-CM | POA: Diagnosis not present

## 2024-03-26 DIAGNOSIS — I447 Left bundle-branch block, unspecified: Secondary | ICD-10-CM | POA: Diagnosis not present

## 2024-03-26 DIAGNOSIS — Z952 Presence of prosthetic heart valve: Secondary | ICD-10-CM | POA: Diagnosis not present

## 2024-03-26 DIAGNOSIS — I5043 Acute on chronic combined systolic (congestive) and diastolic (congestive) heart failure: Secondary | ICD-10-CM | POA: Diagnosis not present

## 2024-03-26 DIAGNOSIS — Z789 Other specified health status: Secondary | ICD-10-CM | POA: Diagnosis not present

## 2024-03-26 DIAGNOSIS — R918 Other nonspecific abnormal finding of lung field: Secondary | ICD-10-CM | POA: Diagnosis not present

## 2024-03-26 DIAGNOSIS — Z87891 Personal history of nicotine dependence: Secondary | ICD-10-CM | POA: Diagnosis not present

## 2024-03-26 LAB — CBC
HCT: 24.9 % — ABNORMAL LOW (ref 39.0–52.0)
Hemoglobin: 8.2 g/dL — ABNORMAL LOW (ref 13.0–17.0)
MCH: 34.9 pg — ABNORMAL HIGH (ref 26.0–34.0)
MCHC: 32.9 g/dL (ref 30.0–36.0)
MCV: 106 fL — ABNORMAL HIGH (ref 80.0–100.0)
Platelets: 97 K/uL — ABNORMAL LOW (ref 150–400)
RBC: 2.35 MIL/uL — ABNORMAL LOW (ref 4.22–5.81)
RDW: 20.6 % — ABNORMAL HIGH (ref 11.5–15.5)
WBC: 5.1 K/uL (ref 4.0–10.5)
nRBC: 2.9 % — ABNORMAL HIGH (ref 0.0–0.2)

## 2024-03-26 LAB — ECHOCARDIOGRAM COMPLETE
AR max vel: 0.75 cm2
AV Area VTI: 0.77 cm2
AV Area mean vel: 0.71 cm2
AV Mean grad: 17 mmHg
AV Peak grad: 32.6 mmHg
Ao pk vel: 2.85 m/s
Area-P 1/2: 5.16 cm2
Calc EF: 23 %
Est EF: <20
Height: 64 in
S' Lateral: 4.3 cm
Single Plane A2C EF: 33 %
Single Plane A4C EF: 13.5 %
Weight: 2049.4 [oz_av]

## 2024-03-26 LAB — URINALYSIS, ROUTINE W REFLEX MICROSCOPIC
Bilirubin Urine: NEGATIVE
Glucose, UA: NEGATIVE mg/dL
Ketones, ur: NEGATIVE mg/dL
Nitrite: NEGATIVE
Protein, ur: NEGATIVE mg/dL
Specific Gravity, Urine: 1.008 (ref 1.005–1.030)
pH: 5 (ref 5.0–8.0)

## 2024-03-26 LAB — MAGNESIUM: Magnesium: 2.5 mg/dL — ABNORMAL HIGH (ref 1.7–2.4)

## 2024-03-26 LAB — IRON AND TIBC
Iron: 146 ug/dL (ref 45–182)
Saturation Ratios: 45 % — ABNORMAL HIGH (ref 17.9–39.5)
TIBC: 325 ug/dL (ref 250–450)
UIBC: 179 ug/dL

## 2024-03-26 LAB — RESPIRATORY PANEL BY PCR

## 2024-03-26 LAB — FOLATE: Folate: 15.8 ng/mL (ref >5.9)

## 2024-03-26 LAB — TROPONIN I (HIGH SENSITIVITY): Troponin I (High Sensitivity): 484 ng/L (ref <18)

## 2024-03-26 LAB — BASIC METABOLIC PANEL WITH GFR
Anion gap: 10 (ref 5–15)
BUN: 74 mg/dL — ABNORMAL HIGH (ref 8–23)
CO2: 18 mmol/L — ABNORMAL LOW (ref 22–32)
Calcium: 8.5 mg/dL — ABNORMAL LOW (ref 8.9–10.3)
Chloride: 104 mmol/L (ref 98–111)
Creatinine, Ser: 2.37 mg/dL — ABNORMAL HIGH (ref 0.61–1.24)
GFR, Estimated: 26 mL/min — ABNORMAL LOW (ref >60)
Glucose, Bld: 108 mg/dL — ABNORMAL HIGH (ref 70–99)
Potassium: 5.1 mmol/L (ref 3.5–5.1)
Sodium: 132 mmol/L — ABNORMAL LOW (ref 135–145)

## 2024-03-26 LAB — RETICULOCYTES
Immature Retic Fract: 33.1 % — ABNORMAL HIGH (ref 2.3–15.9)
RBC.: 1.85 MIL/uL — ABNORMAL LOW (ref 4.22–5.81)
Retic Count, Absolute: 68.3 K/uL (ref 19.0–186.0)
Retic Ct Pct: 3.7 % — ABNORMAL HIGH (ref 0.4–3.1)

## 2024-03-26 LAB — COOXEMETRY PANEL
Carboxyhemoglobin: 1.3 % (ref 0.5–1.5)
Methemoglobin: <0.7 % (ref 0.0–1.5)
O2 Saturation: 49.8 %
Total hemoglobin: 8.3 g/dL — ABNORMAL LOW (ref 12.0–16.0)

## 2024-03-26 LAB — HEPATIC FUNCTION PANEL
ALT: 226 U/L — ABNORMAL HIGH (ref 0–44)
AST: 216 U/L — ABNORMAL HIGH (ref 15–41)
Albumin: 3 g/dL — ABNORMAL LOW (ref 3.5–5.0)
Alkaline Phosphatase: 121 U/L (ref 38–126)
Bilirubin, Direct: 0.7 mg/dL — ABNORMAL HIGH (ref 0.0–0.2)
Indirect Bilirubin: 1.4 mg/dL — ABNORMAL HIGH (ref 0.3–0.9)
Total Bilirubin: 2.1 mg/dL — ABNORMAL HIGH (ref 0.0–1.2)
Total Protein: 6.4 g/dL — ABNORMAL LOW (ref 6.5–8.1)

## 2024-03-26 LAB — TSH: TSH: 3.014 u[IU]/mL (ref 0.350–4.500)

## 2024-03-26 LAB — PREPARE RBC (CROSSMATCH)

## 2024-03-26 LAB — BRAIN NATRIURETIC PEPTIDE: B Natriuretic Peptide: >4500 pg/mL — ABNORMAL HIGH (ref 0.0–100.0)

## 2024-03-26 LAB — FERRITIN: Ferritin: 582 ng/mL — ABNORMAL HIGH (ref 24–336)

## 2024-03-26 LAB — LACTIC ACID, PLASMA: Lactic Acid, Venous: 1.9 mmol/L (ref 0.5–1.9)

## 2024-03-26 LAB — PHOSPHORUS: Phosphorus: 6.1 mg/dL — ABNORMAL HIGH (ref 2.5–4.6)

## 2024-03-26 LAB — VITAMIN B12: Vitamin B-12: 1136 pg/mL — ABNORMAL HIGH (ref 180–914)

## 2024-03-26 MED ORDER — CHLORHEXIDINE GLUCONATE CLOTH 2 % EX PADS
6.0000 | MEDICATED_PAD | Freq: Every day | CUTANEOUS | Status: DC
  Administered 2024-03-26 – 2024-04-10 (×15): 6 via TOPICAL

## 2024-03-26 MED ORDER — PERFLUTREN LIPID MICROSPHERE
1.0000 mL | INTRAVENOUS | Status: AC | PRN
  Administered 2024-03-26: 2 mL via INTRAVENOUS

## 2024-03-26 MED ORDER — TRIMETHOBENZAMIDE HCL 100 MG/ML IM SOLN: 200.0000 mg | Freq: Four times a day (QID) | INTRAMUSCULAR | Status: DC | PRN

## 2024-03-26 MED ORDER — MILRINONE LACTATE IN DEXTROSE 20-5 MG/100ML-% IV SOLN
0.1250 ug/kg/min | INTRAVENOUS | Status: DC
Start: 2024-03-26 — End: 2024-03-28
  Administered 2024-03-26 – 2024-03-27 (×2): 0.125 ug/kg/min via INTRAVENOUS
  Filled 2024-03-26 (×2): qty 100

## 2024-03-26 MED ORDER — SODIUM CHLORIDE 0.9% FLUSH
10.0000 mL | INTRAVENOUS | Status: DC | PRN
  Administered 2024-03-28 – 2024-04-05 (×3): 10 mL

## 2024-03-26 MED ORDER — FUROSEMIDE 10 MG/ML IJ SOLN
40.0000 mg | Freq: Two times a day (BID) | INTRAMUSCULAR | Status: DC
  Administered 2024-03-26: 40 mg via INTRAVENOUS
  Filled 2024-03-26: qty 4

## 2024-03-26 MED ORDER — MILRINONE LACTATE IN DEXTROSE 20-5 MG/100ML-% IV SOLN
0.2500 ug/kg/min | INTRAVENOUS | Status: DC
Start: 2024-03-26 — End: 2024-03-26

## 2024-03-26 MED ORDER — SODIUM CHLORIDE 0.9% FLUSH
3.0000 mL | Freq: Two times a day (BID) | INTRAVENOUS | Status: DC
  Administered 2024-03-26 – 2024-04-10 (×29): 3 mL via INTRAVENOUS

## 2024-03-26 MED ORDER — FUROSEMIDE 10 MG/ML IJ SOLN
80.0000 mg | Freq: Two times a day (BID) | INTRAMUSCULAR | Status: DC
  Administered 2024-03-26 – 2024-03-27 (×2): 80 mg via INTRAVENOUS
  Filled 2024-03-26 (×2): qty 8

## 2024-03-26 MED ORDER — ACETAMINOPHEN 325 MG PO TABS: 650.0000 mg | ORAL_TABLET | Freq: Four times a day (QID) | ORAL | Status: DC | PRN

## 2024-03-26 MED ORDER — SODIUM ZIRCONIUM CYCLOSILICATE 10 G PO PACK
10.0000 g | PACK | Freq: Once | ORAL | Status: AC
  Administered 2024-03-26: 10 g via ORAL

## 2024-03-26 NOTE — ED Notes (Signed)
Pt eating meal tray provided

## 2024-03-26 NOTE — Consult Note (Signed)
 Advanced Heart Failure Team Consult Note   Primary Physician: [name] LABOR, MD Cardiologist:  None  Reason for Consultation: Cardiogenic shock  HPI:    Danny [name] is seen today for evaluation of cardiogenic shock at the request of Dr. Barbaraann with [facility]. 89 y.o. male with history of CAD w/ prior NSTEMI 1/23 s/p CABG (RIMA to LAD, LIMA to OM1, SVG to OM2), ischemic cardiomyopathy, aortic valve stenosis, hx CVA, PAD, hx bladder cancer, CKD IIIa, LBBB.  EF previously 40-45% on LV gram at time of cath/NSTEMI in 1/23. EF improved to 55-60% on follow-up echo.  Last echo 06/25: EF 50-55%, RWMA w/ basal inferior and basal inferoseptal severe HK, RV okay, moderate AS with mean gradient of 23 mmHg and AVA 1.1 cm2 by VTI.  Presented to the ED 03/25/24 with decreased appetite, weakness and abdominal bloating. Labs significant for Scr 2.2, K 5.4, Na 133, lipase 100, AST 205, ALT 207, HS troponin 484, lactic acid 2.2>2.3>1.9, hgb 7.2, BNP > 4500. ECG with SR, known LBBB w/ QRS > 150 ms, PACs. FOBT negative. Abdominal US  with evidence of biliary sludge and gallbladder wall thickening. He was given IVF, tylenol  and lokelma . He was admitted for evaluation and management of acute on chronic HFpEF, acute on chronic anemia, AKI on CKD IIIb and elevated LFTs.  Cardiology consulted. Overall picture concerning for cardiogenic shock. EF 15-20% on preliminary review of echo. Critical aortic valve stenosis also noted. Advanced Heart Failure asked to see to assist with management.  He has been fairly independent in ADLs even until admission. Still drives, takes care of his home and his wife.    Home Medications Prior to Admission medications   Medication Sig Start Date End Date Taking? Authorizing Provider  aspirin  EC 81 MG tablet Take 1 tablet (81 mg total) by mouth daily. Swallow whole. 11/06/23  Yes Weaver, Scott T, PA-C  nitroGLYCERIN  (NITROSTAT ) 0.4 MG SL tablet Place 1 tablet (0.4 mg  total) under the tongue every 5 (five) minutes as needed for chest pain. 05/16/22 03/26/25 Yes Weaver, Scott T, PA-C  rosuvastatin  (CRESTOR ) 20 MG tablet Take 1 tablet (20 mg total) by mouth daily. 01/22/24  Yes Weaver, Scott T, PA-C  oxybutynin  (DITROPAN ) 5 MG tablet Take 1 tablet (5 mg total) by mouth every 8 (eight) hours as needed for bladder spasms. Patient not taking: Reported on 03/26/2024 10/10/23   Devere Lonni Righter, MD  phenazopyridine  (PYRIDIUM ) 100 MG tablet Take 1 tablet (100 mg total) by mouth 3 (three) times daily as needed for pain. Patient not taking: Reported on 03/26/2024 10/10/23 10/09/24  Devere Lonni Righter, MD    Past Medical History: Past Medical History:  Diagnosis Date   (HFpEF) heart failure with preserved ejection fraction (HCC)    Echo 1/23: Inferior AK, mild aortic stenosis (mean 9 mmHg, V-max 210.5 cm/s, DI 0.70), EF 55-60, GR 1 DD, normal RVSF, normal PASP, trivial MR, mild AI, borderline dilation of aortic root (39 mm)   Acid reflux    hiatal hernia   Anemia    Bladder cancer (HCC)    CAD (coronary artery disease)    S/p non-STEMI 1/23 >> s/p CABG   Carotid artery disease (HCC) 08/23/2021   Pre-CABG Dopplers 1/23:R ICA 40-59; L ICA 1-39 // Carotid US  07/20/2023: RICA 40-59; LICA 1-39   CHF (congestive heart failure) (HCC)    Chronic kidney disease (CKD)    Coronary artery disease involving native coronary artery of native heart without  angina pectoris 05/16/2022   Inf NSTEMI s/p CABG in 07/2021 (RIMA-LAD, LIMA-OM1, S-OM2)   Diverticulitis    Erectile dysfunction 04/20/2015   Essential hypertension 04/20/2015   History of colon polyps    History of kidney stones    History of stroke    Noted on brain MRI 07/2021   Hyperlipidemia 05/28/2015   PAD (peripheral artery disease) (HCC)    Pre-CABG Dopplers 1/23: Right ABI 0.65; left ABI 0.82   Skin cancer    Stroke [facility])    noted MRI 2023 pt. unaware    Past Surgical History: Past Surgical History:   Procedure Laterality Date   APPENDECTOMY     cataract surgery     CORONARY ARTERY BYPASS GRAFT N/A 08/01/2021   Procedure: CORONARY ARTERY BYPASS GRAFTING (CABG) X 3  ,ON PUMP, USING LEFT AND RIGHT INTERNAL MAMMARY ARTERIES, RIGHT AND LEFT ENDOSCOPIC GREATER SAPHENOUS VEIN CONDUITS;  Surgeon: Lucas Dorise POUR, MD;  Location: [facility] OR;  Service: Open Heart Surgery;  Laterality: N/A;   ENDOVEIN HARVEST OF GREATER SAPHENOUS VEIN Right 08/01/2021   Procedure: ENDOVEIN HARVEST OF GREATER SAPHENOUS VEIN;  Surgeon: Lucas Dorise POUR, MD;  Location: [facility] OR;  Service: Open Heart Surgery;  Laterality: Right;   LEFT HEART CATH AND CORONARY ANGIOGRAPHY N/A 07/27/2021   Procedure: LEFT HEART CATH AND CORONARY ANGIOGRAPHY;  Surgeon: Darron Deatrice LABOR, MD;  Location: [facility] INVASIVE CV LAB;  Service: Cardiovascular;  Laterality: N/A;   SKIN CANCER EXCISION     TEE WITHOUT CARDIOVERSION N/A 08/01/2021   Procedure: TRANSESOPHAGEAL ECHOCARDIOGRAM (TEE);  Surgeon: Lucas Dorise POUR, MD;  Location: [facility] OR;  Service: Open Heart Surgery;  Laterality: N/A;   TRANSURETHRAL RESECTION OF BLADDER TUMOR N/A 01/03/2023   Procedure: TRANSURETHRAL RESECTION OF BLADDER TUMOR (TURBT);  Surgeon: Devere Lonni Righter, MD;  Location: [facility] ORS;  Service: Urology;  Laterality: N/A;  90 MINUTES   TRANSURETHRAL RESECTION OF BLADDER TUMOR N/A 10/10/2023   Procedure: TURBT (TRANSURETHRAL RESECTION OF BLADDER TUMOR);  Surgeon: Devere Lonni Righter, MD;  Location: [facility] ORS;  Service: Urology;  Laterality: N/A;   TRANSURETHRAL RESECTION OF PROSTATE      Family History: Family History  Problem Relation Age of Onset   Stroke Father 79   Sudden death Mother 26       unknown cause    Social History: Social History   Socioeconomic History   Marital status: Married    Spouse name: Not on file   Number of children: 1   Years of education: 12   Highest education level: Not on file  Occupational History   Occupation: Retired  Tobacco Use    Smoking status: Former    Types: Cigars   Smokeless tobacco: Never   Tobacco comments:    Quit 2016  Vaping Use   Vaping status: Never Used  Substance and Sexual Activity   Alcohol use: Yes    Comment: Occasional beer   Drug use: No   Sexual activity: Not Currently  Other Topics Concern   Not on file  Social History Narrative   Lives with wife and daughter lives with them also/2025   Caffeine use: 1-2 cups coffee in the am and 1 glass tea qpm   Social Drivers of Health   Financial Resource Strain: Medium Risk (11/23/2023)   Overall Financial Resource Strain (CARDIA)    Difficulty of Paying Living Expenses: Somewhat hard  Food Insecurity: No Food Insecurity (11/23/2023)   Hunger Vital Sign    Worried About Running  Out of Food in the Last Year: Never true    Ran Out of Food in the Last Year: Never true  Transportation Needs: No Transportation Needs (11/23/2023)   PRAPARE - Administrator, Civil Service (Medical): No    Lack of Transportation (Non-Medical): No  Physical Activity: Inactive (11/23/2023)   Exercise Vital Sign    Days of Exercise per Week: 0 days    Minutes of Exercise per Session: 0 min  Stress: No Stress Concern Present (11/23/2023)   [employer] - Occupational Stress Questionnaire    Feeling of Stress : Only a little  Social Connections: Moderately Isolated (11/23/2023)   Social Connection and Isolation Panel    Frequency of Communication with Friends and Family: Once a week    Frequency of Social Gatherings with Friends and Family: Never    Attends Religious Services: 1 to 4 times per year    Active Member of [employer] or Organizations: No    Attends Banker Meetings: Never    Marital Status: Married    Allergies:  Allergies  Allergen Reactions   Lipitor [Atorvastatin]     Muscle soreness    Objective:    Vital Signs:   Temp:  [97.5 F (36.4 C)-98.2 F (36.8 C)] 97.5 F (36.4 C) (09/17  1210) Pulse Rate:  [35-90] 87 (09/17 1222) Resp:  [13-27] 20 (09/17 1222) BP: (101-129)/(60-97) 116/64 (09/17 1222) SpO2:  [90 %-100 %] 91 % (09/17 1222) Weight:  [58.1 kg-58.3 kg] 58.1 kg (09/17 1210)    Weight change: Filed Weights   03/26/24 0321 03/26/24 1210  Weight: 58.3 kg 58.1 kg    Intake/Output:   Intake/Output Summary (Last 24 hours) at 03/26/2024 1533 Last data filed at 03/26/2024 1300 Gross per 24 hour  Intake 682.45 ml  Output --  Net 682.45 ml      Physical Exam    General:  Elderly male sitting up in bed Cor: JVP 12 cm. Regular rate & rhythm. 3/6 late peaking crescendo decrescendo systolic murmur at RUSB Lungs: breathing nonlabored Abdomen: soft, nontender, nondistended.  Extremities: 1+ edema, RUE PICC Neuro: alert & orientedx3. Affect pleasant   EKG    SR 83 bpm, LBBB with QRS 154 ms, PACs  Labs   Basic Metabolic Panel: Recent Labs  Lab 03/25/24 1920 03/26/24 0404  NA 133* 132*  K 5.4* 5.1  CL 98 104  CO2 18* 18*  GLUCOSE 126* 108*  BUN 63* 74*  CREATININE 2.20* 2.37*  CALCIUM  9.1 8.5*  MG  --  2.5*  PHOS  --  6.1*    Liver Function Tests: Recent Labs  Lab 03/25/24 1920 03/26/24 0404  AST 205* 216*  ALT 207* 226*  ALKPHOS 141* 121  BILITOT 1.2 2.1*  PROT 7.0 6.4*  ALBUMIN  3.7 3.0*   Recent Labs  Lab 03/25/24 1920  LIPASE 100*   No results for input(s): AMMONIA in the last 168 hours.  CBC: Recent Labs  Lab 03/25/24 1920 03/26/24 0404  WBC 6.1 5.1  HGB 7.2* 8.2*  HCT 22.0* 24.9*  MCV 112.2* 106.0*  PLT 121* 97*    Cardiac Enzymes: No results for input(s): CKTOTAL, CKMB, CKMBINDEX, TROPONINI in the last 168 hours.  BNP: BNP (last 3 results) Recent Labs    03/26/24 0404  BNP >4,500.0*    ProBNP (last 3 results) No results for input(s): PROBNP in the last 8760 hours.   CBG: No results for input(s): GLUCAP in the last  168 hours.  Coagulation Studies: No results for input(s): LABPROT,  INR in the last 72 hours.   Imaging   US  EKG SITE RITE Result Date: 03/26/2024 If Site Rite image not attached, placement could not be confirmed due to current cardiac rhythm.  US  Abdomen Complete Result Date: 03/26/2024 EXAM: COMPLETE ABDOMINAL ULTRASOUND TECHNIQUE: Real-time ultrasonography of the abdomen was performed. COMPARISON: CT of the abdomen and pelvis dated 11/28/2000 and abdominal sonogram dated 10/27/1999. CLINICAL HISTORY: Acute kidney injury (HCC), Transaminitis, Serum lipase elevation. FINDINGS: LIVER: The liver is diffusely heterogeneously echogenic. BILIARY SYSTEM: There is mild biliary sludge. There is thickening of the gallbladder wall, which measures 4.5 mm in thickness. There is a cholesterol polyp demonstrated. The patient is not focally tender over the gallbladder. The common bile duct measures 4 mm in diameter. KIDNEYS: The right kidney measures 8.8 cm in length and contains a simple cyst measuring approximately 1.5 cm. The left kidney measures 10.6 cm in length and contains a complex cyst measuring approximately 4.9 x 3.1 x 4.6 cm. There is a calcified rim, as noted on the previous CT. PANCREAS: The pancreatic duct measures 2 mm in diameter, within normal limits. SPLEEN: No acute abnormality. Spleen is within normal limits in size. VESSELS: The abdominal aorta is normal in caliber measuring 2 cm in diameter, but demonstrates moderate diffuse calcific atheromatous disease. The inferior vena cava is normal in caliber. OTHER: There are bilateral pleural effusions present. IMPRESSION: 1. Mild biliary sludge and gallbladder wall thickening (4.5 mm) with cholesterol polyp. No focal tenderness over the gallbladder. 2. Bilateral pleural effusions. Electronically signed by: Evalene Coho MD 03/26/2024 05:48 AM EDT RP Workstation: [id number]   DG Chest Portable 1 View Result Date: 03/25/2024 CLINICAL DATA:  SOB EXAM: PORTABLE CHEST 1 VIEW COMPARISON:  Chest x-ray 09/07/2021 FINDINGS:  The heart and mediastinal contours are unchanged. Atherosclerotic plaque. Surgical changes noted overlying the mediastinum. Left costophrenic angle collimated off view. No focal consolidation. Mild pulmonary edema. No pleural effusion. No pneumothorax. No acute osseous abnormality.  Intact sternotomy wires. IMPRESSION: 1. Mild pulmonary edema. 2.  Aortic Atherosclerosis (ICD10-I70.0). 3. Left costophrenic angle collimated off view. Electronically Signed   By: Morgane  Naveau M.D.   On: 03/25/2024 19:29     Medications:     Current Medications:  furosemide   80 mg Intravenous BID   sodium chloride  flush  3 mL Intravenous Q12H    Infusions:     Patient Profile   89 y.o. male with history of CAD w/ prior NSTEMI in 2023 s/p CABG, AS, ischemic CM with prior recovery in EF, CKD IIIa, hx bradycardia, bladder cancer s/p TURPT, chronic anemia.  Admitted with acute systolic heart failure/cardiogenic shock in setting of severe AS and acute on chronic anemia.  Assessment/Plan   Acute systolic CHF>cardiogenic shock -EF 50-55% in 06/25 w/ reported moderate AS -Echo this admit: EF 15-20% w/ severe AS on preliminary review -Lactic acid 2.2>2.3>1.9 -Etiology for drop in EF not certain. Could be d/t AS. Also has LBBB w/ wide QRS. -PICC has been placed. Check CO-OX. May need inotrope support. -CVP 12. Significantly volume overloaded. Starting IV lasix  80 BID.  -GDMT limited by AKI  Severe AS -Noted on echo this admit -Not certain if he would be a candidate for TAVR if he recovers from shock  CAD Elevated troponin -NSTEMI 2023 s/p CABG X 3 -HS troponin 484 -Suspect demand ischemia -Statin on hold d/t shock liver. No aspirin  with significant anemia.  Acute on chronic macrocytic anemia -  Hbgb previously 9s-10s, 8.3 in April. 7.2 on admit s/p 1 u RBCs -FOBT X 1 negative -? AVMs in setting of severe AS  AKI on CKD IIIa -Baseline Scr 1.2-1.3, up to 2.37 today in setting of shock -Follow with  diuresis  Elevated LFTs -Suspect shock liver -AST 205>216 -ALT 207>226 -Tbili 1.2>2.1 -US  abdomen with biliary sludge and gallbladder wall thickening  GOC: Currently full code. Consider DNR status. Palliative Care consult.  Length of Stay: 0  Jhamir Pickup N, PA-C  03/26/2024, 3:33 PM    Advanced Heart Failure Team Pager [phone] (M-F; 7a - 5p)  Please contact [facility] for night-coverage after hours (4p -7a ) and weekends on [email or web address]

## 2024-03-26 NOTE — Consult Note (Signed)
 Cardiology Consultation   Patient ID: [name] MRN: [id number]; DOB: Jan 24, 1934  Admit date: 03/25/2024 Date of Consult: 03/26/2024  PCP:  [name] LABOR, MD   [facility] Providers Cardiologist:  Previously, [name]    Patient Profile: Danny [name] is a 89 y.o. male with a hx of CAD history of NSTEMI 07/2021 s/p CABG x 3 (RIMA to LAD, LIMA to OM1, SVG to OM 2), ischemic cardiomyopathy, mild aortic insufficiency, CVA, PAD, hypertension, hyperlipidemia, anemia and bladder cancer who is being seen 03/26/2024 for the evaluation of CHF at the request of Dr. Claudene.  History of Present Illness: Danny [name] is an 89 year old male with past medical history noted above.  He was followed by [name] as an outpatient.  History of CAD with initially having an NSTEMI 07/2021 and ultimately underwent CABG with RIMA to LAD, LIMA to OM1 and SVG to OM 2 that admission.  LVEF was noted to be 40 to 45% on LV gram.  However follow-up echocardiogram showed LVEF of 55 to 60% with akinesis of the basal inferior wall with grade 1 diastolic dysfunction, mild AI and borderline dilatation of aortic root of 39 mm.   Underwent successful cystoscopy with TURBT/08/2023 without complications.  He was noted to be bradycardic at that time and his home Lopressor  was stopped.  Cardiac monitor afterwards showed several episodes of SVT which were brief, longest being 12 beats.  There were 7.1% PACs and 2.1% PVCs.  Follow-up echocardiogram demonstrated normal LVEF, moderate aortic stenosis with mean gradient of 23 mmHg.  Last seen in the office on 01/22/2024 with [name].  He was instructed to restart his home aspirin  and Crestor  as he had stopped these recently.  Given his CKD was referred to nephrology.  Presented to the ED 9/16 with complaints of decreased appetite and weakness. He lives at home with his wife independently and helps care for her. Still drives and takes care of the home. Has noticed  increased weakness over the past couple of weeks and a decrease in his appetite with abd swelling. Has only been taking asa and crestor .   Labs in the ED sodium 133, potassium 5.4, creatinine 2.2, BUN 63, alk phos 141, lipase 100, AST 205, ALT 207, troponin 375>> 360, lactic acid 2.2, WBC 6.1, hemoglobin 7.2, BNP greater than 4,500, lactic acid 2.2>>2.3>>1.9.  Respiratory panel negative, FOBT negative.  Chest x-ray with mild pulmonary edema.  EKG shows sinus rhythm 83 bpm, left bundle branch block (known), PACs.  Abdominal ultrasound showed mild biliary sludge and gallbladder wall thickening.  He was given IV fluids, Tylenol  and Lokelma .  Admitted to internal medicine for further management.  Started on IV Lasix  40 mg twice daily.  Cardiology asked to evaluate.  Past Medical History:  Diagnosis Date   (HFpEF) heart failure with preserved ejection fraction (HCC)    Echo 1/23: Inferior AK, mild aortic stenosis (mean 9 mmHg, V-max 210.5 cm/s, DI 0.70), EF 55-60, GR 1 DD, normal RVSF, normal PASP, trivial MR, mild AI, borderline dilation of aortic root (39 mm)   Acid reflux    hiatal hernia   Anemia    Bladder cancer (HCC)    CAD (coronary artery disease)    S/p non-STEMI 1/23 >> s/p CABG   Carotid artery disease (HCC) 08/23/2021   Pre-CABG Dopplers 1/23:R ICA 40-59; L ICA 1-39 // Carotid US  07/20/2023: RICA 40-59; LICA 1-39   CHF (congestive heart failure) (HCC)    Chronic kidney disease (  CKD)    Coronary artery disease involving native coronary artery of native heart without angina pectoris 05/16/2022   Inf NSTEMI s/p CABG in 07/2021 (RIMA-LAD, LIMA-OM1, S-OM2)   Diverticulitis    Erectile dysfunction 04/20/2015   Essential hypertension 04/20/2015   History of colon polyps    History of kidney stones    History of stroke    Noted on brain MRI 07/2021   Hyperlipidemia 05/28/2015   PAD (peripheral artery disease) (HCC)    Pre-CABG Dopplers 1/23: Right ABI 0.65; left ABI 0.82   Skin cancer     Stroke [facility])    noted MRI 2023 pt. unaware    Past Surgical History:  Procedure Laterality Date   APPENDECTOMY     cataract surgery     CORONARY ARTERY BYPASS GRAFT N/A 08/01/2021   Procedure: CORONARY ARTERY BYPASS GRAFTING (CABG) X 3  ,ON PUMP, USING LEFT AND RIGHT INTERNAL MAMMARY ARTERIES, RIGHT AND LEFT ENDOSCOPIC GREATER SAPHENOUS VEIN CONDUITS;  Surgeon: Lucas Dorise POUR, MD;  Location: [facility] OR;  Service: Open Heart Surgery;  Laterality: N/A;   ENDOVEIN HARVEST OF GREATER SAPHENOUS VEIN Right 08/01/2021   Procedure: ENDOVEIN HARVEST OF GREATER SAPHENOUS VEIN;  Surgeon: Lucas Dorise POUR, MD;  Location: [facility] OR;  Service: Open Heart Surgery;  Laterality: Right;   LEFT HEART CATH AND CORONARY ANGIOGRAPHY N/A 07/27/2021   Procedure: LEFT HEART CATH AND CORONARY ANGIOGRAPHY;  Surgeon: Darron Deatrice LABOR, MD;  Location: [facility] INVASIVE CV LAB;  Service: Cardiovascular;  Laterality: N/A;   SKIN CANCER EXCISION     TEE WITHOUT CARDIOVERSION N/A 08/01/2021   Procedure: TRANSESOPHAGEAL ECHOCARDIOGRAM (TEE);  Surgeon: Lucas Dorise POUR, MD;  Location: [facility] OR;  Service: Open Heart Surgery;  Laterality: N/A;   TRANSURETHRAL RESECTION OF BLADDER TUMOR N/A 01/03/2023   Procedure: TRANSURETHRAL RESECTION OF BLADDER TUMOR (TURBT);  Surgeon: Devere Lonni Righter, MD;  Location: [facility] ORS;  Service: Urology;  Laterality: N/A;  90 MINUTES   TRANSURETHRAL RESECTION OF BLADDER TUMOR N/A 10/10/2023   Procedure: TURBT (TRANSURETHRAL RESECTION OF BLADDER TUMOR);  Surgeon: Devere Lonni Righter, MD;  Location: [facility] ORS;  Service: Urology;  Laterality: N/A;   TRANSURETHRAL RESECTION OF PROSTATE       Scheduled Meds:  furosemide   40 mg Intravenous BID   sodium chloride  flush  3 mL Intravenous Q12H   Continuous Infusions:  PRN Meds: acetaminophen , trimethobenzamide   Allergies:    Allergies  Allergen Reactions   Lipitor [Atorvastatin]     Muscle soreness    Social History:   Social History   Socioeconomic  History   Marital status: Married    Spouse name: Not on file   Number of children: 1   Years of education: 12   Highest education level: Not on file  Occupational History   Occupation: Retired  Tobacco Use   Smoking status: Former    Types: Cigars   Smokeless tobacco: Never   Tobacco comments:    Quit 2016  Vaping Use   Vaping status: Never Used  Substance and Sexual Activity   Alcohol use: Yes    Comment: Occasional beer   Drug use: No   Sexual activity: Not Currently  Other Topics Concern   Not on file  Social History Narrative   Lives with wife and daughter lives with them also/2025   Caffeine use: 1-2 cups coffee in the am and 1 glass tea qpm   Social Drivers of Health   Financial Resource Strain: Medium Risk (11/23/2023)  Overall Financial Resource Strain (CARDIA)    Difficulty of Paying Living Expenses: Somewhat hard  Food Insecurity: No Food Insecurity (11/23/2023)   Hunger Vital Sign    Worried About Running Out of Food in the Last Year: Never true    Ran Out of Food in the Last Year: Never true  Transportation Needs: No Transportation Needs (11/23/2023)   PRAPARE - Administrator, Civil Service (Medical): No    Lack of Transportation (Non-Medical): No  Physical Activity: Inactive (11/23/2023)   Exercise Vital Sign    Days of Exercise per Week: 0 days    Minutes of Exercise per Session: 0 min  Stress: No Stress Concern Present (11/23/2023)   [employer] - Occupational Stress Questionnaire    Feeling of Stress : Only a little  Social Connections: Moderately Isolated (11/23/2023)   Social Connection and Isolation Panel    Frequency of Communication with Friends and Family: Once a week    Frequency of Social Gatherings with Friends and Family: Never    Attends Religious Services: 1 to 4 times per year    Active Member of [employer] or Organizations: No    Attends Banker Meetings: Never    Marital Status: Married   Catering manager Violence: Not At Risk (11/23/2023)   Humiliation, Afraid, Rape, and Kick questionnaire    Fear of Current or Ex-Partner: No    Emotionally Abused: No    Physically Abused: No    Sexually Abused: No    Family History:    Family History  Problem Relation Age of Onset   Stroke Father 39   Sudden death Mother 58       unknown cause     ROS:  Please see the history of present illness.   All other ROS reviewed and negative.     Physical Exam/Data: Vitals:   03/26/24 1145 03/26/24 1147 03/26/24 1210 03/26/24 1222  BP: 116/88   116/64  Pulse: 86   87  Resp: 16  16 20   Temp:  97.7 F (36.5 C) (!) 97.5 F (36.4 C)   TempSrc:  Oral Oral   SpO2: 99%   91%  Weight:   58.1 kg   Height:   5' 4 (1.626 m)     Intake/Output Summary (Last 24 hours) at 03/26/2024 1444 Last data filed at 03/26/2024 0335 Gross per 24 hour  Intake 562.45 ml  Output --  Net 562.45 ml      03/26/2024   12:10 PM 03/26/2024    3:21 AM 01/22/2024    2:20 PM  Last 3 Weights  Weight (lbs) 128 lb 1.4 oz 128 lb 8.5 oz 128 lb 6.4 oz  Weight (kg) 58.1 kg 58.3 kg 58.242 kg     Body mass index is 21.99 kg/m.  General:  Thin older male, sitting up in bed no distress  HEENT: normal Neck: no JVD Vascular: No carotid bruits; Distal pulses 2+ bilaterally Cardiac:  normal S1, S2; RRR; 2-3/6 systolic murmur RUSB  Lungs:  Diminished in bases Abd: soft, nontender Ext: no edema Musculoskeletal:  No deformities, BUE and BLE strength normal and equal Skin: dry, foot somewhat cool to touch Neuro: no focal abnormalities noted Psych:  Normal affect   EKG:  The EKG was personally reviewed and demonstrates: sinus rhythm 83 bpm, left bundle branch block (known), PACs  Telemetry:  Telemetry was personally reviewed and demonstrates:  Sinus Rhythm with PACs  Relevant CV Studies:  Echo:  12/2023  IMPRESSIONS     1. Left ventricular ejection fraction, by estimation, is 50 to 55%. The  left ventricle  has low normal function. The left ventricle demonstrates  regional wall motion abnormalities with basal inferior and basal  inferoseptal severe hypokinesis. Left  ventricular diastolic parameters are consistent with Grade I diastolic  dysfunction (impaired relaxation).   2. Right ventricular systolic function is normal. The right ventricular  size is normal. Tricuspid regurgitation signal is inadequate for assessing  PA pressure.   3. Left atrial size was mildly dilated.   4. The mitral valve is degenerative. Trivial mitral valve regurgitation.  No evidence of mitral stenosis. Moderate mitral annular calcification.   5. The aortic valve is tricuspid. There is severe calcifcation of the  aortic valve. Aortic valve regurgitation is mild. Moderate aortic valve  stenosis. Aortic valve area, by VTI measures 1.10 cm. Aortic valve mean  gradient measures 23.0 mmHg.   6. The inferior vena cava is normal in size with greater than 50%  respiratory variability, suggesting right atrial pressure of 3 mmHg.   FINDINGS   Left Ventricle: Left ventricular ejection fraction, by estimation, is 50  to 55%. The left ventricle has low normal function. The left ventricle  demonstrates regional wall motion abnormalities. The left ventricular  internal cavity size was normal in  size. There is no left ventricular hypertrophy. Left ventricular diastolic  parameters are consistent with Grade I diastolic dysfunction (impaired  relaxation).   Right Ventricle: The right ventricular size is normal. No increase in  right ventricular wall thickness. Right ventricular systolic function is  normal. Tricuspid regurgitation signal is inadequate for assessing PA  pressure.   Left Atrium: Left atrial size was mildly dilated.   Right Atrium: Right atrial size was normal in size.   Pericardium: There is no evidence of pericardial effusion.   Mitral Valve: The mitral valve is degenerative in appearance. There is  mild  calcification of the mitral valve leaflet(s). Moderate mitral annular  calcification. Trivial mitral valve regurgitation. No evidence of mitral  valve stenosis.   Tricuspid Valve: The tricuspid valve is normal in structure. Tricuspid  valve regurgitation is trivial.   The aortic valve is tricuspid. There is severe calcifcation of the aortic  valve. Aortic valve regurgitation is mild. Moderate aortic stenosis is  present. .   Pulmonic Valve: The pulmonic valve was normal in structure. Pulmonic valve  regurgitation is not visualized.   Aorta: The aortic root is normal in size and structure.   Venous: The inferior vena cava is normal in size with greater than 50%  respiratory variability, suggesting right atrial pressure of 3 mmHg.   IAS/Shunts: No atrial level shunt detected by color flow Doppler.    Laboratory Data: High Sensitivity Troponin:   Recent Labs  Lab 03/26/24 0404  TROPONINIHS 484*     Chemistry Recent Labs  Lab 03/25/24 1920 03/26/24 0404  NA 133* 132*  K 5.4* 5.1  CL 98 104  CO2 18* 18*  GLUCOSE 126* 108*  BUN 63* 74*  CREATININE 2.20* 2.37*  CALCIUM  9.1 8.5*  MG  --  2.5*  GFRNONAA 28* 26*  ANIONGAP 17* 10    Recent Labs  Lab 03/25/24 1920 03/26/24 0404  PROT 7.0 6.4*  ALBUMIN  3.7 3.0*  AST 205* 216*  ALT 207* 226*  ALKPHOS 141* 121  BILITOT 1.2 2.1*   Lipids No results for input(s): CHOL, TRIG, HDL, LABVLDL, LDLCALC, CHOLHDL in the last 168 hours.  Hematology Recent Labs  Lab 03/25/24 1920 03/26/24 0037 03/26/24 0404  WBC 6.1  --  5.1  RBC 1.96* 1.85* 2.35*  HGB 7.2*  --  8.2*  HCT 22.0*  --  24.9*  MCV 112.2*  --  106.0*  MCH 36.7*  --  34.9*  MCHC 32.7  --  32.9  RDW 16.5*  --  20.6*  PLT 121*  --  97*   Thyroid   Recent Labs  Lab 03/26/24 0404  TSH 3.014    BNP Recent Labs  Lab 03/26/24 0404  BNP >4,500.0*    DDimer No results for input(s): DDIMER in the last 168 hours.  Radiology/Studies:  US   Abdomen Complete Result Date: 03/26/2024 EXAM: COMPLETE ABDOMINAL ULTRASOUND TECHNIQUE: Real-time ultrasonography of the abdomen was performed. COMPARISON: CT of the abdomen and pelvis dated 11/28/2000 and abdominal sonogram dated 10/27/1999. CLINICAL HISTORY: Acute kidney injury (HCC), Transaminitis, Serum lipase elevation. FINDINGS: LIVER: The liver is diffusely heterogeneously echogenic. BILIARY SYSTEM: There is mild biliary sludge. There is thickening of the gallbladder wall, which measures 4.5 mm in thickness. There is a cholesterol polyp demonstrated. The patient is not focally tender over the gallbladder. The common bile duct measures 4 mm in diameter. KIDNEYS: The right kidney measures 8.8 cm in length and contains a simple cyst measuring approximately 1.5 cm. The left kidney measures 10.6 cm in length and contains a complex cyst measuring approximately 4.9 x 3.1 x 4.6 cm. There is a calcified rim, as noted on the previous CT. PANCREAS: The pancreatic duct measures 2 mm in diameter, within normal limits. SPLEEN: No acute abnormality. Spleen is within normal limits in size. VESSELS: The abdominal aorta is normal in caliber measuring 2 cm in diameter, but demonstrates moderate diffuse calcific atheromatous disease. The inferior vena cava is normal in caliber. OTHER: There are bilateral pleural effusions present. IMPRESSION: 1. Mild biliary sludge and gallbladder wall thickening (4.5 mm) with cholesterol polyp. No focal tenderness over the gallbladder. 2. Bilateral pleural effusions. Electronically signed by: Evalene Coho MD 03/26/2024 05:48 AM EDT RP Workstation: [id number]   DG Chest Portable 1 View Result Date: 03/25/2024 CLINICAL DATA:  SOB EXAM: PORTABLE CHEST 1 VIEW COMPARISON:  Chest x-ray 09/07/2021 FINDINGS: The heart and mediastinal contours are unchanged. Atherosclerotic plaque. Surgical changes noted overlying the mediastinum. Left costophrenic angle collimated off view. No focal  consolidation. Mild pulmonary edema. No pleural effusion. No pneumothorax. No acute osseous abnormality.  Intact sternotomy wires. IMPRESSION: 1. Mild pulmonary edema. 2.  Aortic Atherosclerosis (ICD10-I70.0). 3. Left costophrenic angle collimated off view. Electronically Signed   By: Morgane  Naveau M.D.   On: 03/25/2024 19:29     Assessment and Plan:  Danny [name] is a 89 y.o. male with a hx of CAD history of NSTEMI 07/2021 s/p CABG x 3 (RIMA to LAD, LIMA to OM1, SVG to OM 2), ischemic cardiomyopathy, mild aortic insufficiency, CVA, PAD, hypertension, hyperlipidemia, anemia and bladder cancer who is being seen 03/26/2024 for the evaluation of CHF at the request of Dr. Claudene.  Acute on chronic HFpEF Shock -- presented with abd fullness and decreased appetite worse over the past couple of days.  -- echo 12/2023 with LVEF of 50-55%,  g1DD, moderate aortic stenosis -- CXR mild pulmonary edema, BNP >4500, Cr 2.3, LFTs in the 200s. Overall concerning for shock, suspect cardiogenic -- started on IV lasix  40mg  BID, reports some urine output thus far  -- echo pending, concern EF is down and AS is worse  --  place PICC line, check Coox. Options are limited with renal disease and advanced age   Elevated troponin CAD s/p CABG '23 (RIMA to LAD, LIMA to OM1, SVG to OM 2) -- hsTn 375>>360>>484, EKG without ischemic changes -- denies any chest pain PTA -- on ASA, statin  -- as above, follow up echo   Anemia -- hx of the same, Hgb was 8.3 (10/2023), down to 7.2 on admission. S/p 1 unit PRBCs, hgb now up to 8.2 -- FOBT negative, denies any dark stools or hematuria PTA  CKD stage IIIb -- has been referred to nephrology outpatient but unable to make appt yet -- Cr baseline 1.3-1.6, up 2.2>>2.37 on admission   Aortic stenosis -- echo 12/2023 with moderate AS, mean gradient with severe calcification  -- update echo   Elevated LFTs/lipase -- denies any abd pain, but as noted decreased appetite   -- AST 205>>216, ALT 207>>226, lipase 100, Tbili 2.1 -- abd US  with mild biliary sludge and gallbladder thickening  Thrombocytopenia -- platelets 121>>97  Per primary Bladder CA Hyperkalemia  Risk Assessment/Risk Scores:       [place]  Heart Association (NYHA) Functional Class NYHA Class III     For questions or updates, please contact [facility] Please consult [email or web address] for contact info under   Signed, Manuelita Rummer, NP  03/26/2024 2:44 PM

## 2024-03-26 NOTE — TOC CM/SW Note (Signed)
 Transition of Care [facility]) - Inpatient Brief Assessment   Patient Details  Name: Danny [name] MRN: [id number] Date of Birth: 1933/11/05  Transition of [facility] CM/SW Contact:    Waddell Barnie Rama, RN Phone Number: 03/26/2024, 4:38 PM   Clinical Narrative: From home with spouse, has PCP and insurance on file, states has no HH services in place at this time, has walker/cane at home.  States family member  (daughter)  will transport them home at [employer] and family is support system, states gets medications from [facility] [place].  Pta self ambulatory, uses walker /cane sometimes.   ICM (Inpatient Case Management ) will cont to follow.    Transition of Care Asessment: Insurance and Status: Insurance coverage has been reviewed Patient has primary care physician: Yes Home environment has been reviewed: home with spouse Prior level of function:: indep Prior/Current Home Services: Current home services (walker/cane) Social Drivers of Health Review: SDOH reviewed no interventions necessary Readmission risk has been reviewed: Yes Transition of care needs: no transition of care needs at this time

## 2024-03-26 NOTE — ED Notes (Signed)
 Transport at bedside. IP unit notified of impending patient movement.

## 2024-03-26 NOTE — ED Notes (Signed)
 Patient transported to Ultrasound

## 2024-03-26 NOTE — H&P (Addendum)
 History and Physical    Patient: Danny [name] FMW:[id number] DOB: Feb 09, 1934 DOA: 03/25/2024 DOS: the patient was seen and examined on 03/26/2024 PCP: [name] LABOR, MD  Patient coming from: Transfer from [facility]  Chief Complaint: Weakness  HPI: Danny [name] is a 89 y.o. male with medical history significant of heart failure with preserved ejection fraction, hyperlipidemia, CAD s/p CABG in 2023, CKD, and recurrent bladder cancer s/p TURP who presented with complaints of generalized weakness and fatigue over the last 5 days.  He reports shortness of breath with activity as well as decreased appetite.  Denied any complaints of chest pain, but has felt some congestion in his chest.  He does report having some lower abdominal discomfort.  He has not had any blood present in his stools to his knowledge.  No fever, palpitations, vomiting, dysuria, or diarrhea.  Patient reports that he is the primary caregiver for his wife who has pretty significant dementia and he is worried about her.  In the ED patient was noted to be afebrile with respirations 13-27, and all other vital signs relatively maintained.  Labs from 9/16 significant for hemoglobin 7.2, platelets 121, sodium 133, potassium 5.4, CO2 18, BUN 63, creatinine 2.2, anion gap 17, alkaline phosphatase 141, lipase 100, AST 205, ALT 207, total bilirubin 1.2 high-sensitivity troponin 375-> 360, BNP greater than 4500 and lactic acid 2.2.  Chest x-ray showed mild pulmonary edema.  Influenza, COVID-19, and RSV screening were negative.  Stool guaiac was noted to be negative.  Abdominal ultrasound noted mild biliary sludge and gallbladder wall thickening with cholesterol polyp and bilateral pleural effusions.  Patient had been given 250 mL bolus of IV fluids, Tylenol  650 mg p.o., and Lokelma  10 g p.o.  Review of Systems: As mentioned in the history of present illness. All other systems reviewed and are negative. Past Medical History:  Diagnosis  Date   (HFpEF) heart failure with preserved ejection fraction (HCC)    Echo 1/23: Inferior AK, mild aortic stenosis (mean 9 mmHg, V-max 210.5 cm/s, DI 0.70), EF 55-60, GR 1 DD, normal RVSF, normal PASP, trivial MR, mild AI, borderline dilation of aortic root (39 mm)   Acid reflux    hiatal hernia   Anemia    Bladder cancer (HCC)    CAD (coronary artery disease)    S/p non-STEMI 1/23 >> s/p CABG   Carotid artery disease (HCC) 08/23/2021   Pre-CABG Dopplers 1/23:R ICA 40-59; L ICA 1-39 // Carotid US  07/20/2023: RICA 40-59; LICA 1-39   CHF (congestive heart failure) (HCC)    Chronic kidney disease (CKD)    Coronary artery disease involving native coronary artery of native heart without angina pectoris 05/16/2022   Inf NSTEMI s/p CABG in 07/2021 (RIMA-LAD, LIMA-OM1, S-OM2)   Diverticulitis    Erectile dysfunction 04/20/2015   Essential hypertension 04/20/2015   History of colon polyps    History of kidney stones    History of stroke    Noted on brain MRI 07/2021   Hyperlipidemia 05/28/2015   PAD (peripheral artery disease) (HCC)    Pre-CABG Dopplers 1/23: Right ABI 0.65; left ABI 0.82   Skin cancer    Stroke [facility])    noted MRI 2023 pt. unaware   Past Surgical History:  Procedure Laterality Date   APPENDECTOMY     cataract surgery     CORONARY ARTERY BYPASS GRAFT N/A 08/01/2021   Procedure: CORONARY ARTERY BYPASS GRAFTING (CABG) X 3  ,ON PUMP, USING LEFT AND RIGHT INTERNAL  MAMMARY ARTERIES, RIGHT AND LEFT ENDOSCOPIC GREATER SAPHENOUS VEIN CONDUITS;  Surgeon: Lucas Dorise POUR, MD;  Location: [facility] OR;  Service: Open Heart Surgery;  Laterality: N/A;   ENDOVEIN HARVEST OF GREATER SAPHENOUS VEIN Right 08/01/2021   Procedure: ENDOVEIN HARVEST OF GREATER SAPHENOUS VEIN;  Surgeon: Lucas Dorise POUR, MD;  Location: [facility] OR;  Service: Open Heart Surgery;  Laterality: Right;   LEFT HEART CATH AND CORONARY ANGIOGRAPHY N/A 07/27/2021   Procedure: LEFT HEART CATH AND CORONARY ANGIOGRAPHY;  Surgeon: Darron Deatrice LABOR, MD;  Location: [facility] INVASIVE CV LAB;  Service: Cardiovascular;  Laterality: N/A;   SKIN CANCER EXCISION     TEE WITHOUT CARDIOVERSION N/A 08/01/2021   Procedure: TRANSESOPHAGEAL ECHOCARDIOGRAM (TEE);  Surgeon: Lucas Dorise POUR, MD;  Location: [facility] OR;  Service: Open Heart Surgery;  Laterality: N/A;   TRANSURETHRAL RESECTION OF BLADDER TUMOR N/A 01/03/2023   Procedure: TRANSURETHRAL RESECTION OF BLADDER TUMOR (TURBT);  Surgeon: Devere Lonni Righter, MD;  Location: [facility] ORS;  Service: Urology;  Laterality: N/A;  90 MINUTES   TRANSURETHRAL RESECTION OF BLADDER TUMOR N/A 10/10/2023   Procedure: TURBT (TRANSURETHRAL RESECTION OF BLADDER TUMOR);  Surgeon: Devere Lonni Righter, MD;  Location: [facility] ORS;  Service: Urology;  Laterality: N/A;   TRANSURETHRAL RESECTION OF PROSTATE     Social History:  reports that he has quit smoking. His smoking use included cigars. He has never used smokeless tobacco. He reports current alcohol use. He reports that he does not use drugs.  Allergies  Allergen Reactions   Lipitor [Atorvastatin]     Muscle soreness    Family History  Problem Relation Age of Onset   Stroke Father 61   Sudden death Mother 75       unknown cause    Prior to Admission medications   Medication Sig Start Date End Date Taking? Authorizing Provider  aspirin  EC 81 MG tablet Take 1 tablet (81 mg total) by mouth daily. Swallow whole. 11/06/23  Yes Weaver, Scott T, PA-C  nitroGLYCERIN  (NITROSTAT ) 0.4 MG SL tablet Place 1 tablet (0.4 mg total) under the tongue every 5 (five) minutes as needed for chest pain. 05/16/22 03/26/25 Yes Weaver, Scott T, PA-C  rosuvastatin  (CRESTOR ) 20 MG tablet Take 1 tablet (20 mg total) by mouth daily. 01/22/24  Yes Weaver, Scott T, PA-C  oxybutynin  (DITROPAN ) 5 MG tablet Take 1 tablet (5 mg total) by mouth every 8 (eight) hours as needed for bladder spasms. Patient not taking: Reported on 03/26/2024 10/10/23   Devere Lonni Righter, MD  phenazopyridine   (PYRIDIUM ) 100 MG tablet Take 1 tablet (100 mg total) by mouth 3 (three) times daily as needed for pain. Patient not taking: Reported on 03/26/2024 10/10/23 10/09/24  Devere Lonni Righter, MD    Physical Exam: Vitals:   03/26/24 0500 03/26/24 0515 03/26/24 0600 03/26/24 0717  BP: 117/79 115/64 121/72   Pulse: 87 78 85   Resp: 19 14 16    Temp:    98.1 F (36.7 C)  TempSrc:    Oral  SpO2: 93% 90% 96%   Weight:      Height:       Constitutional: Elderly male who appears to be ill Eyes: PERRL, lids and conjunctivae normal ENMT: Mucous membranes are moist. Posterior pharynx clear of any exudate or lesions.fair dentition Neck: normal, supple, no masses,  Respiratory: clear to auscultation bilaterally, no wheezing, no crackles. Normal respiratory effort.   Cardiovascular: Regular rate and rhythm, no murmurs / rubs / gallops. No extremity edema.  Abdomen: no tenderness, no masses palpated.  Bowel sounds positive.  Musculoskeletal: no clubbing / cyanosis. No joint deformity upper and lower extremities. Good ROM, no contractures. Normal muscle tone.  Skin: no rashes, lesions, ulcers. No induration Neurologic: CN 2-12 grossly intact. Sensation intact, DTR normal. Strength 5/5 in all 4.  Psychiatric: Normal judgment and insight. Alert and oriented x 3. Normal mood.    Data Reviewed:  EKG reveals sinus rhythm at 83 bpm with premature atrial complexes and left bundle branch block.  Reviewed labs, imaging, and pertinent records as documented.  Assessment and Plan:  Heart failure with preserved ejection fraction Acute on chronic.  Patient present yesterday with reported history of feeling unwell with exertional shortness of breath.  Chest x-ray showing pulmonary edema.  BNP greater than 4500.  Last echocardiogram from 12/2023 noted EF to be low normal at 50 to 55% with grade 1 diastolic dysfunction.  Patient had also recently worn a Holter monitor back in May which noted frequent premature  beats and several short episodes of SVT along with 1 pause in the setting of second-degree Mobitz type I AV block. - Admit to a cardiac telemetry bed - Heart failure order set utilized - Strict I&Os and daily weights - Lasix  40 mg IV twice daily - Check echocardiogram - Follow-up telemetry - Cardiology consulted, will follow-up for any further recommendations   Elevated troponin CAD Acute.  Patient denies any complaints of chest pain at this time.  High-sensitivity troponins 375-> 360-> 484.  Patient required prior three-vessel CABG CABG back in 2023.  Possibly secondary to demand ischemia. - Follow-up echocardiogram  Macrocytic anemia Acute on chronic.  Hemoglobin noted to be as low as 7.2.  Patient had been transfused 1 unit of packed red blood cells.  Anemia panel did not note any signs for iron or vitamin B12 deficiency.  Patient was typed and screened for possible need of blood and ordered to be transfused 1 unit of PRBCs.  Stool guaiac was noted to be negative.  Posttransfusion repeat hemoglobin noted to be 8.2.  Question possible source of bleeding - Hold aspirin  - Continue to monitor H&H - Follow-up urinalysis  Acute kidney injury superimposed on chronic kidney disease stage IIIb Creatinine 2.2 ->2.37 with BUN 74.  Baseline creatinine previously noted to be around 1.6 when checked back in 11/2023.  Abdominal ultrasound did not note any signs of hydronephrosis. - Check bladder scan - Check urinalysis - Monitor kidney function with diuresis  Transaminitis Acute.  AST elevated at 205-> 216 and ALT 207-> 226.  Question if secondary to hepatic congestion. - Continue to monitor LFTs  Hyperkalemia Resolved.  Initial potassium elevated at 5.4.  Repeat check noted to be 5.1 after patient had received Lokelma  and a small bolus of 250 mL of IV fluids.  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  S/p cystostomy with  TURBT on 4/2. - Continue outpatient follow-up with urology  Hyperlipidemia - Held Crestor  due to transaminitis  Thrombocytopenia Acute on chronic.  Platelet count noted to be 97. - Continue to monitor  Generalized weakness He reports having generalized weakness which he is having difficulty getting around normally able to ambulate - PT to eval and treat in a.m.   DVT prophylaxis: SCDs Advance Care Planning:   Code Status: Full Code    Consults: Cardiology  Family Communication: Patient's daughter updated over the phone  Severity of Illness: The appropriate patient status for  this patient is INPATIENT. Inpatient status is judged to be reasonable and necessary in order to provide the required intensity of service to ensure the patient's safety. The patient's presenting symptoms, physical exam findings, and initial radiographic and laboratory data in the context of their chronic comorbidities is felt to place them at high risk for further clinical deterioration. Furthermore, it is not anticipated that the patient will be medically stable for discharge from the hospital within 2 midnights of admission.   * I certify that at the point of admission it is my clinical judgment that the patient will require inpatient hospital care spanning beyond 2 midnights from the point of admission due to high intensity of service, high risk for further deterioration and high frequency of surveillance required.*  Author: Maximino DELENA Sharps, MD 03/26/2024 8:51 AM  For on call review [email or web address].

## 2024-03-26 NOTE — Progress Notes (Signed)
 Peripherally Inserted Central Catheter Placement  The IV Nurse has discussed with the patient and/or persons authorized to consent for the patient, the purpose of this procedure and the potential benefits and risks involved with this procedure.  The benefits include less needle sticks, lab draws from the catheter, and the patient may be discharged home with the catheter. Risks include, but not limited to, infection, bleeding, blood clot (thrombus formation), and puncture of an artery; nerve damage and irregular heartbeat and possibility to perform a PICC exchange if needed/ordered by physician.  Alternatives to this procedure were also discussed.  Bard Power PICC patient education guide, fact sheet on infection prevention and patient information card has been provided to patient /or left at bedside.    PICC Placement Documentation  PICC Double Lumen 03/26/24 Right Brachial 38 cm 1 cm (Active)  Indication for Insertion or Continuance of Line Vasoactive infusions 03/26/24 1613  Exposed Catheter (cm) 1 cm 03/26/24 1613  Site Assessment Clean, Dry, Intact 03/26/24 1613  Lumen #1 Status Flushed;Saline locked;Blood return noted 03/26/24 1613  Lumen #2 Status Flushed;Saline locked;Blood return noted 03/26/24 1613  Dressing Type Transparent;Securing device 03/26/24 1613  Dressing Status Antimicrobial disc/dressing in place;Clean, Dry, Intact 03/26/24 1613  Line Care Connections checked and tightened 03/26/24 1613  Line Adjustment (NICU/IV Team Only) No 03/26/24 1613  Dressing Intervention New dressing;Adhesive placed at insertion site (IV team only) 03/26/24 1613  Dressing Change Due 04/02/24 03/26/24 1613       Jolee Na 03/26/2024, 4:15 PM

## 2024-03-27 ENCOUNTER — Other Ambulatory Visit (HOSPITAL_COMMUNITY): Payer: Self-pay

## 2024-03-27 ENCOUNTER — Telehealth (HOSPITAL_COMMUNITY): Payer: Self-pay | Admitting: Pharmacy Technician

## 2024-03-27 DIAGNOSIS — I5023 Acute on chronic systolic (congestive) heart failure: Secondary | ICD-10-CM | POA: Diagnosis not present

## 2024-03-27 DIAGNOSIS — N1831 Chronic kidney disease, stage 3a: Secondary | ICD-10-CM | POA: Diagnosis not present

## 2024-03-27 DIAGNOSIS — I350 Nonrheumatic aortic (valve) stenosis: Secondary | ICD-10-CM | POA: Diagnosis not present

## 2024-03-27 DIAGNOSIS — Z515 Encounter for palliative care: Secondary | ICD-10-CM | POA: Diagnosis not present

## 2024-03-27 DIAGNOSIS — I249 Acute ischemic heart disease, unspecified: Secondary | ICD-10-CM

## 2024-03-27 DIAGNOSIS — N179 Acute kidney failure, unspecified: Secondary | ICD-10-CM | POA: Diagnosis not present

## 2024-03-27 DIAGNOSIS — I2585 Chronic coronary microvascular dysfunction: Secondary | ICD-10-CM

## 2024-03-27 DIAGNOSIS — I2510 Atherosclerotic heart disease of native coronary artery without angina pectoris: Secondary | ICD-10-CM

## 2024-03-27 DIAGNOSIS — I10 Essential (primary) hypertension: Secondary | ICD-10-CM | POA: Diagnosis not present

## 2024-03-27 DIAGNOSIS — R4589 Other symptoms and signs involving emotional state: Secondary | ICD-10-CM

## 2024-03-27 DIAGNOSIS — Z7189 Other specified counseling: Secondary | ICD-10-CM | POA: Diagnosis not present

## 2024-03-27 DIAGNOSIS — I739 Peripheral vascular disease, unspecified: Secondary | ICD-10-CM

## 2024-03-27 DIAGNOSIS — D649 Anemia, unspecified: Secondary | ICD-10-CM

## 2024-03-27 DIAGNOSIS — R570 Cardiogenic shock: Secondary | ICD-10-CM | POA: Diagnosis not present

## 2024-03-27 LAB — TYPE AND SCREEN
ABO/RH(D): A POS
Antibody Screen: NEGATIVE
Unit division: 0

## 2024-03-27 LAB — CBC
HCT: 22.5 % — ABNORMAL LOW (ref 39.0–52.0)
Hemoglobin: 7.6 g/dL — ABNORMAL LOW (ref 13.0–17.0)
MCH: 35.2 pg — ABNORMAL HIGH (ref 26.0–34.0)
MCHC: 33.8 g/dL (ref 30.0–36.0)
MCV: 104.2 fL — ABNORMAL HIGH (ref 80.0–100.0)
Platelets: 82 K/uL — ABNORMAL LOW (ref 150–400)
RBC: 2.16 MIL/uL — ABNORMAL LOW (ref 4.22–5.81)
RDW: 21.2 % — ABNORMAL HIGH (ref 11.5–15.5)
WBC: 5.8 K/uL (ref 4.0–10.5)
nRBC: 2.9 % — ABNORMAL HIGH (ref 0.0–0.2)

## 2024-03-27 LAB — COMPREHENSIVE METABOLIC PANEL WITH GFR
ALT: 258 U/L — ABNORMAL HIGH (ref 0–44)
AST: 200 U/L — ABNORMAL HIGH (ref 15–41)
Albumin: 2.9 g/dL — ABNORMAL LOW (ref 3.5–5.0)
Alkaline Phosphatase: 159 U/L — ABNORMAL HIGH (ref 38–126)
Anion gap: 15 (ref 5–15)
BUN: 88 mg/dL — ABNORMAL HIGH (ref 8–23)
CO2: 21 mmol/L — ABNORMAL LOW (ref 22–32)
Calcium: 8.4 mg/dL — ABNORMAL LOW (ref 8.9–10.3)
Chloride: 98 mmol/L (ref 98–111)
Creatinine, Ser: 2.68 mg/dL — ABNORMAL HIGH (ref 0.61–1.24)
GFR, Estimated: 22 mL/min — ABNORMAL LOW (ref >60)
Glucose, Bld: 138 mg/dL — ABNORMAL HIGH (ref 70–99)
Potassium: 4.6 mmol/L (ref 3.5–5.1)
Sodium: 134 mmol/L — ABNORMAL LOW (ref 135–145)
Total Bilirubin: 1.8 mg/dL — ABNORMAL HIGH (ref 0.0–1.2)
Total Protein: 6 g/dL — ABNORMAL LOW (ref 6.5–8.1)

## 2024-03-27 LAB — COOXEMETRY PANEL
Carboxyhemoglobin: 1.2 % (ref 0.5–1.5)
Carboxyhemoglobin: 1.4 % (ref 0.5–1.5)
Methemoglobin: <0.7 % (ref 0.0–1.5)
Methemoglobin: <0.7 % (ref 0.0–1.5)
O2 Saturation: 50.5 %
O2 Saturation: 55.9 %
Total hemoglobin: 7.9 g/dL — ABNORMAL LOW (ref 12.0–16.0)
Total hemoglobin: 8.1 g/dL — ABNORMAL LOW (ref 12.0–16.0)

## 2024-03-27 LAB — BPAM RBC
Blood Product Expiration Date: 202510052359
ISSUE DATE / TIME: 202509170201
Unit Type and Rh: 6200

## 2024-03-27 LAB — MAGNESIUM: Magnesium: 2.3 mg/dL (ref 1.7–2.4)

## 2024-03-27 MED ORDER — ASPIRIN 81 MG PO CHEW: 81.0000 mg | CHEWABLE_TABLET | Freq: Every day | ORAL | Status: DC

## 2024-03-27 MED ORDER — ROSUVASTATIN CALCIUM 5 MG PO TABS
10.0000 mg | ORAL_TABLET | Freq: Every day | ORAL | Status: DC
  Administered 2024-03-28 – 2024-04-10 (×13): 10 mg via ORAL
  Filled 2024-03-27 (×13): qty 2

## 2024-03-27 MED ORDER — ORAL CARE MOUTH RINSE: 15.0000 mL | OROMUCOSAL | Status: DC | PRN

## 2024-03-27 MED ORDER — SENNA 8.6 MG PO TABS
1.0000 | ORAL_TABLET | Freq: Every day | ORAL | Status: DC | PRN
  Administered 2024-03-27 – 2024-04-02 (×3): 8.6 mg via ORAL
  Filled 2024-03-27 (×4): qty 1

## 2024-03-27 NOTE — Evaluation (Signed)
 Occupational Therapy Evaluation Patient Details Name: Danny [name] MRN: [id number] DOB: [birth year] Today's Date: 03/27/2024   History of Present Illness   Pt is an 89 y/o male admitted with c/o generalized weakness and fatigue over the last 5 days.  Work up showed HF with preserved EF, elevated troponin, AKI and Severe AS.  PMHx:   CAD, CHF, CKD, HTN, PAD, stroke,   CABG     Clinical Impressions Pt admitted based on above, and was seen based on problem list below. PTA pt was mod I with ADLs and IADLs. Today pt is requiring set up  to CGA for ADLs. Functional transfers are  CGA with RW. Pt mobilizing well but is limited by decreased strength, balance, and activity tolerance. Pt with adequate family support available upon d/c, anticipate will progress well no follow up OT needs. OT will continue to follow acutely to maximize functional independence.        If plan is discharge home, recommend the following:   A little help with walking and/or transfers;A little help with bathing/dressing/bathroom;Assistance with cooking/housework     Functional Status Assessment   Patient has had a recent decline in their functional status and demonstrates the ability to make significant improvements in function in a reasonable and predictable amount of time.     Equipment Recommendations   None recommended by OT      Precautions/Restrictions   Precautions Precautions: Fall Recall of Precautions/Restrictions: Intact     Mobility Bed Mobility Overal bed mobility: Independent       General bed mobility comments: Mod I to return to supine, HOB elevated    Transfers Overall transfer level: Needs assistance Equipment used: Rolling walker (2 wheels) Transfers: Sit to/from Stand Sit to Stand: Contact guard assist           General transfer comment: CGA for balance      Balance Overall balance assessment: Needs assistance Sitting-balance support: No upper extremity  supported, Feet supported Sitting balance-Leahy Scale: Good     Standing balance support: No upper extremity supported, Bilateral upper extremity supported Standing balance-Leahy Scale: Fair         ADL either performed or assessed with clinical judgement   ADL Overall ADL's : Needs assistance/impaired Eating/Feeding: Set up;Sitting   Grooming: Set up;Sitting           Upper Body Dressing : Set up;Sitting   Lower Body Dressing: Contact guard assist;Sit to/from stand Lower Body Dressing Details (indicate cue type and reason): Able to figure 4 legs, CGA for balance in standing Toilet Transfer: Contact guard assist;Rolling walker (2 wheels) Toilet Transfer Details (indicate cue type and reason): Simulated in room short step pivot         Functional mobility during ADLs: Contact guard assist;Rolling walker (2 wheels) General ADL Comments: Decreased activity tolerance     Vision Baseline Vision/History: 0 No visual deficits Patient Visual Report: No change from baseline Vision Assessment?: No apparent visual deficits            Pertinent Vitals/Pain Pain Assessment Pain Assessment: No/denies pain Faces Pain Scale: No hurt Pain Intervention(s): Monitored during session     Extremity/Trunk Assessment Upper Extremity Assessment Upper Extremity Assessment: Generalized weakness   Lower Extremity Assessment Lower Extremity Assessment: Defer to PT evaluation   Cervical / Trunk Assessment Cervical / Trunk Assessment: Normal   Communication Communication Communication: No apparent difficulties   Cognition Arousal: Alert Behavior During Therapy: WFL for tasks assessed/performed Cognition: No apparent impairments  Following commands: Intact       Cueing  General Comments   Cueing Techniques: Verbal cues  Inconsistent pleth for O2, but all other vital signs stable on 2L           Home Living Family/patient expects to be discharged to:: Private  residence Living Arrangements: Spouse/significant other;Children (dtr) Available Help at Discharge: Family;Available 24 hours/day Type of Home: House Home Access: Ramped entrance     Home Layout: One level     Bathroom Shower/Tub: Producer, television/film/video: Standard Bathroom Accessibility: Yes How Accessible: Accessible via walker Home Equipment: Rolling Walker (2 wheels);Cane - single point;BSC/3in1;Shower seat;Grab bars - toilet;Grab bars - tub/shower;Wheelchair - manual          Prior Functioning/Environment Prior Level of Function : Independent/Modified Independent       Mobility Comments: mod I, no AD ADLs Comments: Mod I, wife's primary caregiver    OT Problem List: Decreased strength;Decreased range of motion;Decreased activity tolerance;Impaired balance (sitting and/or standing);Cardiopulmonary status limiting activity   OT Treatment/Interventions: Self-care/ADL training;Therapeutic exercise;Energy conservation;DME and/or AE instruction;Therapeutic activities;Patient/family education;Balance training      OT Goals(Current goals can be found in the care plan section)   Acute Rehab OT Goals Patient Stated Goal: To rest OT Goal Formulation: With patient Time For Goal Achievement: 04/10/24 Potential to Achieve Goals: Good   OT Frequency:  Min 2X/week       AM-PAC OT 6 Clicks Daily Activity     Outcome Measure Help from another person eating meals?: None Help from another person taking care of personal grooming?: A Little Help from another person toileting, which includes using toliet, bedpan, or urinal?: A Little Help from another person bathing (including washing, rinsing, drying)?: A Little Help from another person to put on and taking off regular upper body clothing?: A Little Help from another person to put on and taking off regular lower body clothing?: A Little 6 Click Score: 19   End of Session Equipment Utilized During Treatment: Gait  belt;Rolling walker (2 wheels);Oxygen Nurse Communication: Mobility status  Activity Tolerance: Patient tolerated treatment well Patient left: in bed;with call bell/phone within reach;with bed alarm set  OT Visit Diagnosis: Unsteadiness on feet (R26.81);Other abnormalities of gait and mobility (R26.89);Muscle weakness (generalized) (M62.81)                Time: 8475-8458 OT Time Calculation (min): 17 min Charges:  OT General Charges $OT Visit: 1 Visit OT Evaluation $OT Eval Moderate Complexity: 1 Mod  Adrianne BROCKS, OT  Acute Rehabilitation Services Office [phone] Secure chat preferred   Adrianne GORMAN Savers 03/27/2024, 3:53 PM

## 2024-03-27 NOTE — Assessment & Plan Note (Signed)
 Continue supportive medical care

## 2024-03-27 NOTE — Consult Note (Signed)
 Palliative Care Consult Note                                  Date: 03/27/2024   Patient Name: Danny [name]  DOB: May 18, 1934  MRN: [id number]  Age / Sex: 89 y.o., male  PCP: [name] LABOR, MD Referring Physician: Noralee Elidia Toribio DEWAINE  Reason for Consultation: Establishing goals of care  Past Medical History:  Diagnosis Date   (HFpEF) heart failure with preserved ejection fraction (HCC)    Echo 1/23: Inferior AK, mild aortic stenosis (mean 9 mmHg, V-max 210.5 cm/s, DI 0.70), EF 55-60, GR 1 DD, normal RVSF, normal PASP, trivial MR, mild AI, borderline dilation of aortic root (39 mm)   Acid reflux    hiatal hernia   Anemia    Bladder cancer (HCC)    CAD (coronary artery disease)    S/p non-STEMI 1/23 >> s/p CABG   Carotid artery disease (HCC) 08/23/2021   Pre-CABG Dopplers 1/23:R ICA 40-59; L ICA 1-39 // Carotid US  07/20/2023: RICA 40-59; LICA 1-39   CHF (congestive heart failure) (HCC)    Chronic kidney disease (CKD)    Coronary artery disease involving native coronary artery of native heart without angina pectoris 05/16/2022   Inf NSTEMI s/p CABG in 07/2021 (RIMA-LAD, LIMA-OM1, S-OM2)   Diverticulitis    Erectile dysfunction 04/20/2015   Essential hypertension 04/20/2015   History of colon polyps    History of kidney stones    History of stroke    Noted on brain MRI 07/2021   Hyperlipidemia 05/28/2015   PAD (peripheral artery disease) (HCC)    Pre-CABG Dopplers 1/23: Right ABI 0.65; left ABI 0.82   Skin cancer    Stroke [facility])    noted MRI 2023 pt. unaware    Subjective:   This NP Camellia Kays reviewed medical records, received report from team, assessed the patient and then meet at the patient's bedside to discuss diagnosis, prognosis, GOC, EOL wishes disposition and options.  Before meeting with the patient/family, I spent time reviewing the chart notes including admission H&P from yesterday, ED provider notes  from yesterday, nursing note from yesterday, cardiology note from yesterday, heart failure note from yesterday, cardiology note from today, heart failure note from today, PT note from today, nursing notes today. I also reviewed vital signs, nursing flowsheets, medication administrations record, labs, and imaging. Labs reviewed include 2 cooxemetry panels showing saturations of 50.5 and 55.9 apparently before and after initiation of inotrope and consistent with cardiogenic shock.  CBC shows persistently low hemoglobin at 7.6, slight decline from 8.2 yesterday with anemia on presentation to the ED.  CMP shows progressive worsening of creatinine with recent baseline of 1.33-1.66 now noted to be 2.2 on admission increasing to 2.37 and 2.68 today consistent with AKI associated with cardiogenic shock  I met with the patient at the bedside, no family was present.  After seeing the patient I did call his daughter [name].   We meet to discuss diagnosis prognosis, GOC, EOL wishes, disposition and options. Concept of Palliative Care was introduced as specialized medical care for people and their families living with serious illness.  If focuses on providing relief from the symptoms and stress of a serious illness.  The goal is to improve quality of life for both the patient and the family. Values and goals of care important to patient and family were attempted to be elicited.  Created  space and opportunity for patient  and family to explore thoughts and feelings regarding current medical situation   Natural trajectory and current clinical status were discussed. Questions and concerns addressed. Patient  encouraged to call with questions or concerns.    Patient/Family Understanding of Illness: Patient understands that he went to [facility] because of weakness and poor ambulation over a couple days and I told him his hemoglobin was low.  I asked him about his heart and he said he knows he has heart problems.  I spent  time talking about CHF, likely due to bad valve.  I relayed the information in the form of the heart as a pump that is having to work too hard and is burning out because of the valve problems.  He states I did not know my heart was that bad.  I am not convinced that he fully comprehends the situation.  We did talk about potential options moving forward, mostly dependent on kidney function.  He verbalized understanding.  I called spoke with his daughter I again discussed the above information and she seems to understand.  She knows that he is very sick and kidney function will likely determine what possible options he has.  She is also quite overwhelmed.  Life Review: The patient has a wife with dementia and he is a primary caregiver.  They have been married for 63 years.  He has a daughter [name] and no grandchildren.  His younger brother died when he was 53 and his older brother died when he was 47.  He worked for 40 years, starting at [employer] for 23 years and [employer] for 12 years.  After that he did all jobs until retirement.  He likes to read and do yard work.  He states that he genuinely enjoys looking after his wife.  He also enjoys going to church and notes that his pastor visited yesterday.  I offered spiritual care consult for chaplaincy support and he is in agreement.    Daughter [name] states that her parents have been married for 63 years and they are a very cute couple and very much in a lot of.  She worries about what would happen to her mother if something happened to her father.  Patient Values: Family, especially his wife  Baseline Status: Fully functional and independent, primary caregiver for his wife with dementia.  Today's Discussion: In addition to discussions described above we had extensive discussion of various topics.  The patient's daughter is very overwhelmed and tearful, as is understandable.  However, she provides substantial amount of history about health  status leading up to admission.  She notes that after bringing her father to the emergency room she got stranded because their car broke down.  She has been trying to contact her church members to see if they can bring her to the hospital.  She feels like she is letting her dad down because she told him she would be back to visit and has not been able to because of the car malfunction.  She is also worried about her mother with dementia.  She states today is a raging day with her dementia and she keeps walking around the house looking for her husband.  She is struggling on telling her how sick he is.  I tried to provide emotional support and reassurance.  We talked about issues as far as giving dementia patients bad news and told her that we would support however we could.  However,  ultimately is a personal decision on what to tell, when, and how based on knowing her mother.  She again states that she is going to try to bring her mother to the hospital to see him this afternoon/evening.  We spent time talking about the severity of the stenosis, the limitations of workup because of AKI and kidney problems.  She understands a lot of potential options would be dependent on improvement in kidney function.  We talked about allowing time for outcomes to see how his kidneys respond.  We also discussed CODE STATUS.  I relayed the medical recommendation for DNR status given the severity of his heart disease and AKI relating statistical likelihood of good outcomes in patients with similar clinical situations.  She seems to understand and agree with the concept of DNR she would like to talk to her father about it.  When talking with the patient about CODE STATUS, he seemed to grasp the information, but was not forthcoming with a decision or opinion.  [name] states that this is very much like her father to be a bit evasive on difficult decisions.  I encouraged them to have these conversations ongoing and we would as  well.  I shared a palliative medicine follow-up tomorrow to check in on his progress. I provided emotional and general support through therapeutic listening, empathy, sharing of stories, and other techniques. I answered all questions and addressed all concerns to the best of my ability.  Goals: Per the patient to get better and get home to take care of his wife.  After discussion with patient and family we have agreed for ongoing GOC discussions, especially related to CODE STATUS.  Time for outcomes to see how his kidneys respond  Review of Systems  Constitutional:  Positive for fatigue.  Respiratory:  Positive for shortness of breath (Improved). Negative for chest tightness.   Cardiovascular:  Negative for chest pain.  Gastrointestinal:  Negative for abdominal pain, nausea and vomiting.  Neurological:  Positive for weakness.    Objective:   Primary Diagnoses: Present on Admission:  Elevated troponin  (HFpEF) heart failure with preserved ejection fraction (HCC)  Macrocytic anemia  Acute kidney injury superimposed on chronic kidney disease (HCC)  Transaminitis  Hyperkalemia  Hyperlipidemia  Thrombocytopenia (HCC)   Vital Signs:  BP (!) 107/59   Pulse 66   Temp 97.7 F (36.5 C) (Oral)   Resp 16   Ht 5' 4 (1.626 m)   Wt 58.4 kg   SpO2 100%   BMI 22.11 kg/m   Physical Exam Vitals and nursing note reviewed.  Constitutional:      General: He is not in acute distress.    Appearance: He is ill-appearing. He is not toxic-appearing.     Comments: Sitting in a bedside chair  HENT:     Head: Normocephalic and atraumatic.  Cardiovascular:     Rate and Rhythm: Normal rate.  Pulmonary:     Effort: Pulmonary effort is normal. No respiratory distress.  Abdominal:     General: Abdomen is flat. There is no distension.     Tenderness: There is no abdominal tenderness.  Skin:    General: Skin is warm and dry.  Neurological:     General: No focal deficit present.     Mental  Status: He is alert and oriented to person, place, and time.  Psychiatric:        Mood and Affect: Mood normal.        Behavior: Behavior normal.  Palliative Assessment/Data: 60-70%   Advanced Care Planning:   Existing Vynca/ACP Documentation: None  Primary Decision Maker: PATIENT  Pertinent diagnosis: Severe aortic stenosis, acute on chronic heart failure, severe reduction in EF from baseline, cardiogenic shock  The patient and/or family consented to a voluntary Advance Care Planning Conversation in person (patient) and over the phone (patient's daughter [name]). Individuals present for the conversation: The patient, daughter [name] Frieze, NP  Summary of the conversation: We discussed clinical picture, challenges related to AKI, CODE STATUS, need for decisions moving forward, possibility of limited options  Outcome of the conversations and/or documents completed: Ongoing family discussions about CODE STATUS, going to see conversations, full code for now, time for outcomes related to kidney function in setting of cardiogenic shock to determine feasibility of TAVR workup  I spent 50 minutes providing separately identifiable ACP services with the patient and/or surrogate decision maker in a voluntary, in-person conversation discussing the patient's wishes and goals as detailed in the above note.  Assessment & Plan:   HPI/Patient Profile: 89 y.o. male  with past medical history of heart failure with preserved ejection fraction, hyperlipidemia, CAD s/p CABG in 2023, CKD, and recurrent bladder cancer s/p TURP who presented with complaints of generalized weakness and fatigue over the last 5 days.  He was admitted on 03/25/2024 with HFpEF, elevated troponin macrocytic anemia, AKI superimposed on CKD 3B, transaminitis papillary bladder tumor, and others.  After workup determined to be in acute systolic heart failure with EF less than 20%, severe aortic stenosis, cardiogenic shock, lactic  acidosis, AKI, acute liver failure, LBBB, and others.  Palliative medicine was consulted for GOC conversations.  SUMMARY OF RECOMMENDATIONS   Full code for now Continued discussions with patient and family about CODE STATUS Time for outcomes, especially kidney function on ionotropes Ongoing GOC pending evolution of his clinical picture Continued support of patient and family Palliative medicine will continue to follow  Symptom Management:  Per primary team Palliative medicine is available to assist as needed  Code Status: Full Code  Prognosis:  Unable to determine  Discharge Planning:  To Be Determined   Discussed with: Patient, family, medical team, nursing team   Thank you for allowing us  to participate in the care of Finlay Godbee Steinhaus PMT will continue to support holistically.  Billing based on MDM: High  Problems Addressed: One acute or chronic illness or injury that poses a threat to life or bodily function  Amount and/or Complexity of Data: Category 1:Review of prior external note(s) from each unique source, Review of the result(s) of each unique test, and Assessment requiring an independent historian(s) and Category 3:Discussion of management or test interpretation with external physician/other qualified health care professional/appropriate source (not separately reported)  Risks: N/A  Detailed review of medical records (labs, imaging, vital signs), medically appropriate exam, discussed with treatment team, counseling and education to patient, family, & staff, documenting clinical information, medication management, coordination of care  Signed by: Camellia Kays, NP Palliative Medicine Team  Team Phone # [phone] (Nights/Weekends)  03/27/2024, 3:14 PM

## 2024-03-27 NOTE — Telephone Encounter (Signed)
 Pharmacy Patient Advocate Encounter  Insurance verification completed.    The patient is insured through [employer]. Patient has Medicare and is not eligible for a copay card, but may be able to apply for patient assistance or Medicare RX Payment Plan (Patient Must reach out to their plan, if eligible for payment plan), if available.    Ran test claim for Jardiance 25mg  tablets and the current 30 day co-pay is $40.00.  Ran test claim for Farxiga 10mg  tablets and the current 30 day co-pay is $64.00.  Ran test claim for sacubitril-valsartan 49-51 MG tablets and the current 30 day co-pay is $10.00.   This test claim was processed through [facility]- copay amounts may vary at other pharmacies due to pharmacy/plan contracts, or as the patient moves through the different stages of their insurance plan.

## 2024-03-27 NOTE — TOC Initial Note (Addendum)
 Transition of Care [facility]) - Initial/Assessment Note    Patient Details  Name: Danny [name] MRN: [id number] Date of Birth: 1933-10-31  Transition of [facility] CM/SW Contact:    Danny Delcia Czar, RN Phone Number: [phone] 03/27/2024, 1:06 PM  Clinical Narrative:                 Spoke to pt at bedside. States he lives independently at home with wife and dtr. Gave permission to speak to wife or dtr. Reports having rolling walker and cane at home. Offered choice for [facility]. Medicare.gov list with ratings. Contacted [facility] rep, Danny [name] with new referral.  Provided pt with Living better with HF booklet and states he has scale at home for daily weights.   Waiting PT/OT evaluation and recommendations for home.   Will need Home Health RN and PT orders with F2F, Disease Mgmt. Pt may need oxygen for home.   Contacted dtr, and attempted to leave message but mailbox full.     Expected Discharge Plan: Home w Home Health Services Barriers to Discharge: Continued Medical Work up   Patient Goals and CMS Choice Patient states their goals for this hospitalization and ongoing recovery are:: wants to remain independent          Expected Discharge Plan and Services   Discharge Planning Services: CM Consult   Living arrangements for the past 2 months: Single Family Home                                      Prior Living Arrangements/Services Living arrangements for the past 2 months: Single Family Home Lives with:: Adult Children, Spouse Patient language and need for interpreter reviewed:: Yes Do you feel safe going back to the place where you live?: Yes      Need for Family Participation in Patient Care: No (Comment) Care giver support system in place?: Yes (comment) Current home services: DME (wheelchair, cane, scale) Criminal Activity/Legal Involvement Pertinent to Current Situation/Hospitalization: No - Comment as needed  Activities of Daily Living   ADL Screening  (condition at time of admission) Independently performs ADLs?: No Does the patient have a NEW difficulty with bathing/dressing/toileting/self-feeding that is expected to last >3 days?: Yes (Initiates electronic notice to provider for possible OT consult) Does the patient have a NEW difficulty with getting in/out of bed, walking, or climbing stairs that is expected to last >3 days?: Yes (Initiates electronic notice to provider for possible PT consult) Does the patient have a NEW difficulty with communication that is expected to last >3 days?: No Is the patient deaf or have difficulty hearing?: No Does the patient have difficulty seeing, even when wearing glasses/contacts?: No Does the patient have difficulty concentrating, remembering, or making decisions?: No  Permission Sought/Granted Permission sought to share information with : Case Manager, Family Supports, PCP Permission granted to share information with : Yes, Verbal Permission Granted  Share Information with NAME: Danny [name]  Permission granted to share info w AGENCY: PCP, DME, Home Health  Permission granted to share info w Relationship: daughter  Permission granted to share info w Contact Information: [phone]  Emotional Assessment Appearance:: Appears stated age Attitude/Demeanor/Rapport: Engaged Affect (typically observed): Accepting Orientation: : Oriented to Self, Oriented to Place, Oriented to  Time, Oriented to Situation   Psych Involvement: No (comment)  Admission diagnosis:  ACS (acute coronary syndrome) (HCC) [I24.9] Demand ischemia (HCC) [I24.89] AKI (  acute kidney injury) (HCC) [N17.9] Symptomatic anemia [D64.9] Patient Active Problem List   Diagnosis Date Noted   Demand ischemia (HCC) 03/26/2024   Elevated troponin 03/26/2024   Acute kidney injury superimposed on chronic kidney disease (HCC) 03/26/2024   Transaminitis 03/26/2024   Hyperkalemia 03/26/2024   History of bladder cancer 03/26/2024    Hyperlipidemia 03/26/2024   Generalized weakness 03/26/2024   Bradycardia with 31-40 beats per minute 10/10/2023   Bradycardia 10/10/2023   Scalp abrasion 01/11/2023   Bladder cancer (HCC) 01/11/2023   Coronary artery disease involving native coronary artery of native heart without angina pectoris 05/16/2022   Painless hematuria 01/19/2022   Onychomycosis 08/23/2021   (HFpEF) heart failure with preserved ejection fraction (HCC) 08/23/2021   PAD (peripheral artery disease) (HCC) 08/23/2021   Carotid artery disease (HCC) 08/23/2021   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/2021   Aortic stenosis 07/30/2021   B12 deficiency 07/29/2021   Thrombocytopenia (HCC) 07/28/2021   Macrocytic anemia 07/28/2021   Seizure-like activity (HCC) 07/27/2021   Generalized abdominal pain 09/30/2020   Loss of weight 09/30/2020   CKD (chronic kidney disease) stage 3, GFR 30-59 ml/min (HCC) 11/21/2019   Transient alteration of awareness 07/25/2018   Syncope 07/25/2018   Hyperlipidemia LDL goal <70 05/28/2015   Medicare annual wellness visit, subsequent 04/20/2015   Erectile dysfunction 04/20/2015   Essential hypertension 04/20/2015   PCP:  Danny Almarie LABOR, MD Pharmacy:   [facility] [address]), [place] [address] [place] ([place]) [place] [address] Phone: [phone] Fax: [phone]  [name] [address] [place] [address] Phone: [phone] Fax: [phone]     Social Drivers of Health (SDOH) Social History: SDOH Screenings   Food Insecurity: No Food Insecurity (03/26/2024)  Housing: Low Risk  (03/26/2024)  Transportation Needs: No Transportation Needs (03/26/2024)  Utilities: Not At Risk (03/26/2024)  Alcohol Screen: Low Risk  (11/23/2023)  Depression (PHQ2-9): Low Risk  (11/23/2023)  Financial Resource Strain: Medium Risk (11/23/2023)  Physical Activity: Inactive (11/23/2023)  Social Connections: Moderately Isolated (03/26/2024)   Stress: No Stress Concern Present (11/23/2023)  Tobacco Use: Medium Risk (03/26/2024)  Health Literacy: Adequate Health Literacy (11/23/2023)   SDOH Interventions:     Readmission Risk Interventions    03/26/2024    4:38 PM 08/10/2021   10:15 AM  Readmission Risk Prevention Plan  Post Dischage Appt  Complete  Medication Screening  Complete  Transportation Screening Complete Complete  Home Care Screening Complete   Medication Review (RN CM) Complete

## 2024-03-27 NOTE — Hospital Course (Addendum)
 89/M w systolic CHF, aortic stenosis, CAD, CKD, and bladder cancer sp TURP who presented with generalized weakness and fatigue.  In ED, vol overloaded, cr 2.2 , troponin 375 and 360 , Lactic acid 2.2 , hgb 7.2  Echo with reduced EF,  with signs of hypoperfusion.  Placed on milrinone  for inotropic support.  -9/19 off milrinone .  -9/22 limited echocardiogram with sever low flow gradient aortic stenosis.  - 9/23 right and left thoracentesis, 1.3 L drained on left and 1.4 on right - 9/24, structural heart team consult for TAVR  -9/26: underwent TAVR -9/27: Platelet count worsening - 9/29, hematology consult for worsening thrombocytopenia

## 2024-03-27 NOTE — Progress Notes (Signed)
 Heart Failure Navigator Progress Note  Assessed for Heart & Vascular TOC clinic readiness.  Patient does not meet criteria due to Advanced Heart Failure Team patient of Dr. Gasper Lloyd.   Navigator will sign off at this time.   Rhae Hammock, BSN, Scientist, clinical (histocompatibility and immunogenetics) Only

## 2024-03-27 NOTE — Evaluation (Signed)
 Physical Therapy Evaluation Patient Details Name: Danny [name] MRN: [id number] DOB: [birth year] Today's Date: 03/27/2024  History of Present Illness  Pt is an 89 y/o male admitted with c/o generalized weakness and fatigue over the last 5 days.  Work up showed HF with preserved EF, elevated troponin, AKI and Severe AS.  PMHx:   CAD, CHF, CKD, HTN, PAD, stroke,   CABG  Clinical Impression  Pt admitted with/for weakness and fatigue.  Found to be in HF with severe AS and AKI.  Pt needing CGA overall at this time.  Pt currently limited functionally due to the problems listed below.  (see problems list.)  Pt will benefit from PT to maximize function and safety to be able to get home safely with available assist.         If plan is discharge home, recommend the following:  (prn assist of dtr.)   Can travel by private vehicle        Equipment Recommendations None recommended by PT  Recommendations for Other Services       Functional Status Assessment Patient has had a recent decline in their functional status and demonstrates the ability to make significant improvements in function in a reasonable and predictable amount of time.     Precautions / Restrictions Precautions Precautions: Fall (lower risk) Recall of Precautions/Restrictions: Intact      Mobility  Bed Mobility Overal bed mobility: Needs Assistance Bed Mobility: Supine to Sit, Sit to Supine     Supine to sit: Supervision     General bed mobility comments: used UE's appropriately for assist, but flatter bed and no rails    Transfers Overall transfer level: Needs assistance   Transfers: Sit to/from Stand Sit to Stand: Contact guard assist           General transfer comment: used UE's safely and appropriately, no assist needed.    Ambulation/Gait Ambulation/Gait assistance: Contact guard assist Gait Distance (Feet): 30 Feet Assistive device: Rolling walker (2 wheels) Gait Pattern/deviations: Step-through  pattern   Gait velocity interpretation: <1.31 ft/sec, indicative of household ambulator   General Gait Details: slow and mildly unsteady from recent inactivity.  feeling weak.  Sats holding well on RA at rest, but dropped over 30 feet to 81% on RA.  Quick recovery on 3L to 94% in less than 1 min.  Stairs            Wheelchair Mobility     Tilt Bed    Modified Rankin (Stroke Patients Only)       Balance Overall balance assessment: Needs assistance Sitting-balance support: No upper extremity supported, Feet supported Sitting balance-Leahy Scale: Good     Standing balance support: No upper extremity supported, Bilateral upper extremity supported Standing balance-Leahy Scale: Fair                               Pertinent Vitals/Pain Pain Assessment Pain Assessment: Faces Faces Pain Scale: No hurt Pain Intervention(s): Monitored during session    Home Living Family/patient expects to be discharged to:: Private residence Living Arrangements: Spouse/significant other;Children (dtr) Available Help at Discharge: Family;Available 24 hours/day Type of Home: House Home Access: Ramped entrance       Home Layout: One level Home Equipment: Agricultural consultant (2 wheels);Cane - single point;BSC/3in1;Shower seat;Grab bars - toilet;Grab bars - tub/shower;Wheelchair - manual      Prior Function Prior Level of Function : Independent/Modified Independent  Mobility Comments: 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 ADLs Comments: Mod I     Extremity/Trunk Assessment   Upper Extremity Assessment Upper Extremity Assessment: Generalized weakness    Lower Extremity Assessment Lower Extremity Assessment: Generalized weakness    Cervical / Trunk Assessment Cervical / Trunk Assessment: Normal  Communication   Communication Communication: No apparent difficulties    Cognition Arousal: Alert Behavior During Therapy: WFL for tasks  assessed/performed   PT - Cognitive impairments: No apparent impairments                         Following commands: Intact       Cueing Cueing Techniques: Verbal cues     General Comments General comments (skin integrity, edema, etc.): VSS, pt still needs low level of O2 during activity  see gait comments    Exercises     Assessment/Plan    PT Assessment Patient needs continued PT services  PT Problem List Decreased strength;Decreased activity tolerance;Decreased balance;Decreased mobility;Cardiopulmonary status limiting activity       PT Treatment Interventions DME instruction;Gait training;Functional mobility training;Therapeutic activities;Balance training;Patient/family education    PT Goals (Current goals can be found in the Care Plan section)  Acute Rehab PT Goals Patient Stated Goal: return to independence.  able to assist dtr with my wife. PT Goal Formulation: With patient Time For Goal Achievement: 04/10/24 Potential to Achieve Goals: Good    Frequency Min 3X/week     Co-evaluation               AM-PAC PT 6 Clicks Mobility  Outcome Measure Help needed turning from your back to your side while in a flat bed without using bedrails?: A Little Help needed moving from lying on your back to sitting on the side of a flat bed without using bedrails?: A Little Help needed moving to and from a bed to a chair (including a wheelchair)?: A Little Help needed standing up from a chair using your arms (e.g., wheelchair or bedside chair)?: A Little Help needed to walk in hospital room?: A Little Help needed climbing 3-5 steps with a railing? : A Little 6 Click Score: 18    End of Session Equipment Utilized During Treatment: Oxygen Activity Tolerance: Patient tolerated treatment well;Patient limited by fatigue Patient left: in chair;with call bell/phone within reach Nurse Communication: Mobility status PT Visit Diagnosis: Other abnormalities of gait and  mobility (R26.89);Muscle weakness (generalized) (M62.81);Difficulty in walking, not elsewhere classified (R26.2)    Time: 8860-8789 PT Time Calculation (min) (ACUTE ONLY): 31 min   Charges:   PT Evaluation $PT Eval Moderate Complexity: 1 Mod PT Treatments $Gait Training: 8-22 mins PT General Charges $$ ACUTE PT VISIT: 1 Visit         03/27/2024  India HERO., PT Acute Rehabilitation Services [phone]  (office)  Vinie GAILS Bronc Brosseau 03/27/2024, 12:33 PM

## 2024-03-27 NOTE — Plan of Care (Signed)
   Brief Palliative Medicine Progress Note:  PMT consult received and chart reviewed. Goals of care completed, full note to follow.  Recommendations: Full code for now Continued discussions with patient and family about CODE STATUS Time for outcomes, especially kidney function on ionotropes Ongoing GOC pending evolution of his clinical picture Continued support of patient and family Palliative medicine will continue to follow   Thank you for allowing us  to participate in the care of Danny [name]  Signed by: Camellia Kays, NP Palliative Medicine Team [facility] CHARGE  Team Phone # [phone] (Nights/Weekends)  03/27/2024, 3:44 PM

## 2024-03-27 NOTE — Plan of Care (Signed)
  Problem: Education: Goal: Knowledge of General Education information will improve Description: Including pain rating scale, medication(s)/side effects and non-pharmacologic comfort measures Outcome: Progressing   Problem: Skin Integrity: Goal: Risk for impaired skin integrity will decrease Outcome: Progressing   Problem: Safety: Goal: Ability to remain free from injury will improve Outcome: Progressing   Problem: Pain Managment: Goal: General experience of comfort will improve and/or be controlled Outcome: Progressing   Problem: Education: Goal: Ability to demonstrate management of disease process will improve Outcome: Progressing

## 2024-03-27 NOTE — Progress Notes (Addendum)
 Pt is alert and fully oriented x 4, on 2 LPM of NCL, normal respiratory effort, stable hemodynamically, afebrile. Sinus rhythm on the monitor. CVP 12-15 mmHg. Pt is on continue drip Milrinone  IV. He has no acute distress. Able to sleep well with no major complaints. Plan of care is reviewed. We will continue to monitor.   Wendi Dash, RN

## 2024-03-27 NOTE — Assessment & Plan Note (Addendum)
 Echocardiogram with reduced LV systolic function < 20%, global hypokinesis, RV with moderate reduction in systolic function, LA with severe dilatation, RA with moderate dilatation, severe mitral valve regurgitation, moderate to severe tricuspid valve regurgitation, low flow severe aortic valve stenosis with moderate regurgitation, moderate pulmonary valve regurgitation,   Urine output is 950 ml Systolic blood pressure 115 mmHg.  Sv02 61  09/19 off milrinone   Resume diuresis with furosemide  40 mg IV x1  Poor prognosis due to valvular heart disease.  Follow up with final recommendations from cardiology for valvular heart disease.   Congested hepatopathy die to volume overload.  AST and ALT are trending down.  Continue supportive medical care. No hepatic encephalopathy.

## 2024-03-27 NOTE — Progress Notes (Addendum)
 Cardiology Progress Note  Patient ID: Danny [name] MRN: [id number] DOB: [birth year] Date of Encounter: 03/27/2024 Primary Cardiologist: None  Subjective   Chief Complaint: SOB  HPI: Coox confirms he is in cardiogenic shock.  Minimal urine output.  Creatinine rising.  Hopefully urine output will improve.  ROS:  All other ROS reviewed and negative. Pertinent positives noted in the HPI.     Telemetry  Overnight telemetry shows sinus rhythm 80s with PVCs, which I personally reviewed.    Physical Exam   Vitals:   03/27/24 0500 03/27/24 0600 03/27/24 0700 03/27/24 0727  BP: 107/65 114/74 114/65   Pulse: 69 77  84  Resp: 12 12  20   Temp:    97.6 F (36.4 C)  TempSrc:    Oral  SpO2: 100% 99%  98%  Weight:      Height:        Intake/Output Summary (Last 24 hours) at 03/27/2024 1007 Last data filed at 03/27/2024 0859 Gross per 24 hour  Intake 571.38 ml  Output 680 ml  Net -108.62 ml       03/27/2024    4:23 AM 03/26/2024   12:10 PM 03/26/2024    3:21 AM  Last 3 Weights  Weight (lbs) 128 lb 12.8 oz 128 lb 1.4 oz 128 lb 8.5 oz  Weight (kg) 58.423 kg 58.1 kg 58.3 kg    Body mass index is 22.11 kg/m.  General: Well nourished, well developed, in no acute distress Head: Atraumatic, normal size  Eyes: PEERLA, EOMI  Neck: Supple, JVD 10-12 cmH2O Endocrine: No thryomegaly Cardiac: Normal S1, S2; RRR; 3 out of 6 harsh systolic ejection murmur, late peaking Lungs: Crackles bilaterally Abd: Soft, nontender, no hepatomegaly  Ext: Cool extremities Musculoskeletal: No deformities, BUE and BLE strength normal and equal Skin: Warm and dry, no rashes   Neuro: Alert and oriented to person, place, time, and situation, CNII-XII grossly intact, no focal deficits  Psych: Normal mood and affect   Cardiac Studies  TTE 03/26/2024  1. Left ventricular ejection fraction, by estimation, is <20%. The left  ventricle has severely decreased function. The left ventricle demonstrates  global  hypokinesis. Left ventricular diastolic parameters are consistent  with Grade II diastolic  dysfunction (pseudonormalization). Elevated left ventricular end-diastolic  pressure.   2. Right ventricular systolic function is moderately reduced. The right  ventricular size is normal. There is moderately elevated pulmonary artery  systolic pressure.   3. Left atrial size was severely dilated.   4. Right atrial size was moderately dilated.   5. Moderate pleural effusion in the left lateral region.   6. The mitral valve is degenerative. Severe mitral valve regurgitation.  No evidence of mitral stenosis.   7. Tricuspid valve regurgitation is moderate to severe.   8. Low flow, low gradient severe aortic stenosis. Dimensionless index  0.22. Aortic regurgitation pressure half-time 351 ms. The aortic valve is  calcified. There is severe calcifcation of the aortic valve. There is  severe thickening of the aortic valve.  Aortic valve regurgitation is moderate. Severe aortic valve stenosis.  Aortic valve area, by VTI measures 0.77 cm. Aortic valve mean gradient  measures 17.0 mmHg. Aortic valve Vmax measures 2.85 m/s.   9. Pulmonic valve regurgitation is moderate.  10. The inferior vena cava is normal in size with <50% respiratory  variability, suggesting right atrial pressure of 8 mmHg.   Patient Profile  Danny [name] is a 89 y.o. male with CAD status post CABG, left bundle  branch block, diastolic heart failure, stroke, PAD, moderate aortic stenosis admitted on 03/25/2024 for acute systolic heart failure and cardiogenic shock.  Assessment & Plan   # Acute systolic heart failure, EF less than 20% # Severe aortic stenosis # Cardiogenic shock # Lactic acidosis # AKI # Acute liver failure - CVP acceptable per heart failure.  Holding diuresis. - Currently on milrinone  0.125 mcg/kg/min. - Coox 55 - Would recommend to hold GDMT.  Liver enzymes are improving.  Creatinine is rising. - Elderly  gentleman with advanced comorbidities.  Not a candidate for advanced therapies.  Advanced heart failure team is following.  Likely for palliative inotropes.  If he does not improve I suspect he will transition to hospice. - I have recommended a palliative care consult today. - I will continue inotropes.  We will see how he does.  He understands the severity of his condition. - LVEF drop could also be related to LBBB.   # Severe aortic stenosis - Unclear if he will make it to TAVR procedure.  Kidney function precludes workup of this.  Management per heart failure team with inotropes.  # Elevated troponin, demand ischemia # CAD status post CABG - Troponin elevation is secondary to decompensated heart failure. - Holding aspirin  in setting of anemia. - Holding statin in the setting of acute liver failure in the setting of cardiogenic shock.  # LBBB # QRS 154 ms - Likely contributing to cardiomyopathy.  # Anemia - Unclear etiology.  May be anemia of chronic disease.  Hemoglobin 8.3 and April 2025. - Continues to trend down.  No signs of bleeding.     For questions or updates, please contact [facility] Please consult [email or web address] for contact info under         Signed, Darryle T. Barbaraann, MD, [facility] [place]  [facility]  03/27/2024 10:07 AM

## 2024-03-27 NOTE — Progress Notes (Signed)
  Progress Note   Patient: Danny [name] FMW:[id number] DOB: 25-Jun-1934 DOA: 03/25/2024     1 DOS: the patient was seen and examined on 03/27/2024   Brief hospital course: Danny [name] was admitted to the hospital with the working diagnosis of heart failure exacerbation.   89 yo male with the past medical history of heart failure, aortic stenosis, coronary artery disease, CKD, and bladder cancer sp TURP who presented with generalized weakness and fatigue.  On his initial physical examination his blood pressure 117/79, HR 78, RR 19 and 02 saturation 90%  Lungs with no wheezing or rhonchi, heart with S1 and S2 present and regular, positive systolic murmur at the base, abdomen soft and not distended and positive lower extremity edema.   Echocardiogram with reduced LV systolic function, with signs of hypoperfusion.  Placed on milrinone  for inotropic support.   Assessment and Plan: Acute on chronic systolic CHF (congestive heart failure) (HCC) Echocardiogram with reduced LV systolic function < 20%, global hypokinesis, RV with moderate reduction in systolic function, LA with severe dilatation, RA with moderate dilatation, severe mitral valve regurgitation, moderate to severe tricuspid valve regurgitation, low flow severe aortic valve stenosis with moderate regurgitation, moderate pulmonary valve regurgitation, \  Volume status has improved  Documented urine output is 680 ml Systolic blood pressure 100 mmHg.  Sv02 55.9   Plan to continue milrinone  at 0,125 mcg/kg/min Holding on diuretic therapy for now.  Poor prognosis due to valvular heart disease.   Congested hepatopathy die to volume overload.  Continue elevated AST and ALT.  Continue supportive medical care. No hepatic encephalopathy.   Essential hypertension Continue blood pressure monitoring   Chronic kidney disease, stage 3a (HCC) AKI, hyponatremia,   Renal function with serum cr at 2,68 with K at 4,6 and serum bicarbonate at 21   Na 134   Continue close monitoring renal function and electrolytes.   Coronary artery disease Hold on aspirin  due to anemia., Resume statin.  Elevation in high sensitive troponin due to heart failure, no acute coronary syndrome.   PAD (peripheral artery disease) (HCC) Continue supportive medical care  Hyperlipidemia Continue statin therapy        Subjective: Patient with no chest pain, dyspnea has been stable, no PND or orthopnea.   Physical Exam: Vitals:   03/27/24 0727 03/27/24 1100 03/27/24 1116 03/27/24 1530  BP:  (!) 107/59  116/65  Pulse: 84  66 76  Resp: 20 12 16  (!) 24  Temp: 97.6 F (36.4 C)  97.7 F (36.5 C) (!) 97.5 F (36.4 C)  TempSrc: Oral  Oral Axillary  SpO2: 98%  100% 100%  Weight:      Height:       Neurology awake and alert, deconditioned and ill looking appearing Mild confusion with no agitation ENT with mild pallor Cardiovascular with S1 and S2 present and regular, positive systolic murmur at the base No JVD Respiratory with mild rales at bases bilaterally with no wheezing or rhonchi  Abdomen with no distention  Lower extremity edema +  Data Reviewed:    Family Communication: no family at the bedside   Disposition: Status is: Inpatient Remains inpatient appropriate because: IV diuresis and inotropic support   Planned Discharge Destination: Skilled nursing facility     Author: Elidia Toribio Furnace, MD 03/27/2024 5:28 PM  For on call review [email or web address].

## 2024-03-27 NOTE — Progress Notes (Signed)
 Advanced Heart Failure Rounding Note  Cardiologist: None  Chief Complaint: Heart Failure/AS Subjective:   9/17- Diuresed with IV lasix .   Milrinone  0.125. CO-OX 55%. Worsening renal function.  No complaints.   Objective:   Weight Range: 58.4 kg Body mass index is 22.11 kg/m.   Vital Signs:   Temp:  [97.5 F (36.4 C)-97.8 F (36.6 C)] 97.6 F (36.4 C) (09/18 0400) Pulse Rate:  [60-90] 77 (09/18 0600) Resp:  [12-20] 12 (09/18 0600) BP: (103-122)/(55-89) 114/74 (09/18 0600) SpO2:  [78 %-100 %] 99 % (09/18 0600) Weight:  [58.1 kg-58.4 kg] 58.4 kg (09/18 0423) Last BM Date : 03/26/24 (stated)  Weight change: Filed Weights   03/26/24 0321 03/26/24 1210 03/27/24 0423  Weight: 58.3 kg 58.1 kg 58.4 kg    Intake/Output:   Intake/Output Summary (Last 24 hours) at 03/27/2024 0723 Last data filed at 03/27/2024 0400 Gross per 24 hour  Intake 371.38 ml  Output 680 ml  Net -308.62 ml     CVP 9  Physical Exam    General:   No resp difficulty Neck: no JVD.  Cor: Regular rate & rhythm. RUSB  murmur  Lungs: clear Abdomen: soft, nontender, nondistended.  Extremities: no  edema. RUE PICC Neuro: alert & oriented x3  Telemetry   SR 70-80s   EKG    N/A  Labs    CBC Recent Labs    03/26/24 0404 03/27/24 0430  WBC 5.1 5.8  HGB 8.2* 7.6*  HCT 24.9* 22.5*  MCV 106.0* 104.2*  PLT 97* 82*   Basic Metabolic Panel Recent Labs    90/82/74 0404 03/27/24 0430  NA 132* 134*  K 5.1 4.6  CL 104 98  CO2 18* 21*  GLUCOSE 108* 138*  BUN 74* 88*  CREATININE 2.37* 2.68*  CALCIUM  8.5* 8.4*  MG 2.5* 2.3  PHOS 6.1*  --    Liver Function Tests Recent Labs    03/26/24 0404 03/27/24 0430  AST 216* 200*  ALT 226* 258*  ALKPHOS 121 159*  BILITOT 2.1* 1.8*  PROT 6.4* 6.0*  ALBUMIN  3.0* 2.9*   Recent Labs    03/25/24 1920  LIPASE 100*   Cardiac Enzymes No results for input(s): CKTOTAL, CKMB, CKMBINDEX, TROPONINI in the last 72 hours.  BNP: BNP  (last 3 results) Recent Labs    03/26/24 0404  BNP >4,500.0*    ProBNP (last 3 results) No results for input(s): PROBNP in the last 8760 hours.   D-Dimer No results for input(s): DDIMER in the last 72 hours. Hemoglobin A1C No results for input(s): HGBA1C in the last 72 hours. Fasting Lipid Panel No results for input(s): CHOL, HDL, LDLCALC, TRIG, CHOLHDL, LDLDIRECT in the last 72 hours. Thyroid  Function Tests Recent Labs    03/26/24 0404  TSH 3.014    Other results:   Imaging    ECHOCARDIOGRAM COMPLETE Result Date: 03/26/2024    ECHOCARDIOGRAM REPORT   Patient Name:   Danny [name] Date of Exam: 03/26/2024 Medical Rec #:  [id number]      Height:       64.0 in Accession #:    [id number]     Weight:       128.1 lb Date of Birth:  02-06-1934     BSA:          1.619 m Patient Age:    89 years       BP:           116/64 mmHg Patient  Gender: M              HR:           81 bpm. Exam Location:  Inpatient Procedure: 2D Echo and Intracardiac Opacification Agent (Both Spectral and Color            Flow Doppler were utilized during procedure). Indications:    CHF  History:        Patient has prior history of Echocardiogram examinations. CHF,                 CAD, Prior CABG; Aortic Valve Disease and Mitral Valve Disease.  Sonographer:    Norleen Amour Referring Phys: MAXIMINO LABOR SMITH IMPRESSIONS  1. Left ventricular ejection fraction, by estimation, is <20%. The left ventricle has severely decreased function. The left ventricle demonstrates global hypokinesis. Left ventricular diastolic parameters are consistent with Grade II diastolic dysfunction (pseudonormalization). Elevated left ventricular end-diastolic pressure.  2. Right ventricular systolic function is moderately reduced. The right ventricular size is normal. There is moderately elevated pulmonary artery systolic pressure.  3. Left atrial size was severely dilated.  4. Right atrial size was moderately dilated.  5. Moderate  pleural effusion in the left lateral region.  6. The mitral valve is degenerative. Severe mitral valve regurgitation. No evidence of mitral stenosis.  7. Tricuspid valve regurgitation is moderate to severe.  8. Low flow, low gradient severe aortic stenosis. Dimensionless index 0.22. Aortic regurgitation pressure half-time 351 ms. The aortic valve is calcified. There is severe calcifcation of the aortic valve. There is severe thickening of the aortic valve. Aortic valve regurgitation is moderate. Severe aortic valve stenosis. Aortic valve area, by VTI measures 0.77 cm. Aortic valve mean gradient measures 17.0 mmHg. Aortic valve Vmax measures 2.85 m/s.  9. Pulmonic valve regurgitation is moderate. 10. The inferior vena cava is normal in size with <50% respiratory variability, suggesting right atrial pressure of 8 mmHg. FINDINGS  Left Ventricle: Left ventricular ejection fraction, by estimation, is <20%. The left ventricle has severely decreased function. The left ventricle demonstrates global hypokinesis. The left ventricular internal cavity size was normal in size. There is no  left ventricular hypertrophy. Left ventricular diastolic parameters are consistent with Grade II diastolic dysfunction (pseudonormalization). Elevated left ventricular end-diastolic pressure. Right Ventricle: The right ventricular size is normal. No increase in right ventricular wall thickness. Right ventricular systolic function is moderately reduced. There is moderately elevated pulmonary artery systolic pressure. The tricuspid regurgitant velocity is 3.05 m/s, and with an assumed right atrial pressure of 8 mmHg, the estimated right ventricular systolic pressure is 45.2 mmHg. Left Atrium: Left atrial size was severely dilated. Right Atrium: Right atrial size was moderately dilated. Pericardium: There is no evidence of pericardial effusion. Mitral Valve: The mitral valve is degenerative in appearance. Severe mitral valve regurgitation. No  evidence of mitral valve stenosis. Tricuspid Valve: The tricuspid valve is normal in structure. Tricuspid valve regurgitation is moderate to severe. No evidence of tricuspid stenosis. Aortic Valve: Low flow, low gradient severe aortic stenosis. Dimensionless index 0.22. Aortic regurgitation pressure half-time 351 ms. The aortic valve is calcified. There is severe calcifcation of the aortic valve. There is severe thickening of the aortic valve. Aortic valve regurgitation is moderate. Severe aortic stenosis is present. Aortic valve mean gradient measures 17.0 mmHg. Aortic valve peak gradient measures 32.6 mmHg. Aortic valve area, by VTI measures 0.77 cm. Pulmonic Valve: The pulmonic valve was normal in structure. Pulmonic valve regurgitation is moderate. No evidence of  pulmonic stenosis. Aorta: The aortic root is normal in size and structure. Venous: The inferior vena cava is normal in size with less than 50% respiratory variability, suggesting right atrial pressure of 8 mmHg. IAS/Shunts: No atrial level shunt detected by color flow Doppler. Additional Comments: There is a moderate pleural effusion in the left lateral region.  LEFT VENTRICLE PLAX 2D LVIDd:         4.70 cm      Diastology LVIDs:         4.30 cm      LV e' medial:    3.81 cm/s LV PW:         1.00 cm      LV E/e' medial:  20.7 LV IVS:        1.00 cm      LV e' lateral:   10.30 cm/s LVOT diam:     2.10 cm      LV E/e' lateral: 7.6 LV SV:         39 LV SV Index:   24 LVOT Area:     3.46 cm  LV Volumes (MOD) LV vol d, MOD A2C: 197.0 ml LV vol d, MOD A4C: 148.0 ml LV vol s, MOD A2C: 132.0 ml LV vol s, MOD A4C: 128.0 ml LV SV MOD A2C:     65.0 ml LV SV MOD A4C:     148.0 ml LV SV MOD BP:      40.1 ml RIGHT VENTRICLE             IVC RV Basal diam:  4.00 cm     IVC diam: 1.50 cm RV S prime:     10.10 cm/s TAPSE (M-mode): 1.3 cm LEFT ATRIUM             Index        RIGHT ATRIUM           Index LA diam:        3.50 cm 2.16 cm/m   RA Area:     19.70 cm LA Vol  (A2C):   65.3 ml 40.34 ml/m  RA Volume:   62.30 ml  38.48 ml/m LA Vol (A4C):   63.6 ml 39.29 ml/m LA Biplane Vol: 64.7 ml 39.97 ml/m  AORTIC VALVE AV Area (Vmax):    0.75 cm AV Area (Vmean):   0.71 cm AV Area (VTI):     0.77 cm AV Vmax:           285.40 cm/s AV Vmean:          190.000 cm/s AV VTI:            0.503 m AV Peak Grad:      32.6 mmHg AV Mean Grad:      17.0 mmHg LVOT Vmax:         61.80 cm/s LVOT Vmean:        38.967 cm/s LVOT VTI:          0.111 m LVOT/AV VTI ratio: 0.22  AORTA Ao Root diam: 2.80 cm Ao Asc diam:  2.40 cm MITRAL VALVE               TRICUSPID VALVE MV Area (PHT): 5.16 cm    TR Peak grad:   37.2 mmHg MV Decel Time: 147 msec    TR Vmax:        305.00 cm/s MV E velocity: 78.70 cm/s MV A velocity: 58.10 cm/s  SHUNTS MV E/A ratio:  1.35  Systemic VTI:  0.11 m                            Systemic Diam: 2.10 cm Annabella Scarce MD Electronically signed by Annabella Scarce MD Signature Date/Time: 03/26/2024/7:32:52 PM    Final    US  EKG SITE RITE Result Date: 03/26/2024 If Site Rite image not attached, placement could not be confirmed due to current cardiac rhythm.    Medications:     Scheduled Medications:  Chlorhexidine  Gluconate Cloth  6 each Topical Daily   furosemide   80 mg Intravenous BID   sodium chloride  flush  3 mL Intravenous Q12H    Infusions:  milrinone  0.125 mcg/kg/min (03/26/24 2121)    PRN Medications: acetaminophen , mouth rinse, sodium chloride  flush, trimethobenzamide     Patient Profile   89 y.o. male with history of CAD w/ prior NSTEMI in 2023 s/p CABG, AS, ischemic CM with prior recovery in EF, CKD IIIa, hx bradycardia, bladder cancer s/p TURPT, chronic anemia.     Admitted with acute systolic heart failure/cardiogenic shock in setting of severe AS and acute on chronic anemia.   Assessment/Plan   Acute systolic CHF>cardiogenic shock -EF 50-55% in 06/25 w/ reported moderate AS -Echo this admit: EF 15-20% w/ severe AS on preliminary  review -Lactic acid 2.2>2.3>1.9 -Etiology for drop in EF not certain. Could be d/t AS. Also has LBBB w/ wide QRS.  -On Milrinone  0.125 mcg. CO-OX 55%. QX calculate CO 4.3 CI 2.65  GDMT to limitee with shock/AKI -CVP 9. Hold lasix . Worsening renal function.   -GDMT limited by AKI   Severe AS -Noted on echo this admit -Not certain if he would be a candidate for TAVR if he recovers from shock   CAD Elevated troponin -NSTEMI 2023 s/p CABG X 3 -HS troponin 484 -Suspect demand ischemia -Statin on hold d/t shock liver. No aspirin  with significant anemia.   Acute on chronic macrocytic anemia -Hbgb previously 9s-10s, 8.3 in April. 7.2 on admit s/p 1 u RBCs -FOBT X 1 negative. Hgb down to 7.6 today. No obvious source.  Getting blood per primary team.  -? AVMs in setting of severe AS   AKI on CKD IIIa -Baseline Scr 1.2-1.3, up to 2.7 today in setting of shock -Hold diuretics.  Elevated LFTs -Suspect shock liver -AST 205>216> 200 -ALT 207>226> 258 -Tbili 1.2>2.1>1.8  -US  abdomen with biliary sludge and gallbladder wall thickening   GOC: Currently full code. Consider DNR status. Palliative Care consult.   Length of Stay: 1  Hamdi Vari, NP  03/27/2024, 7:23 AM  Advanced Heart Failure Team Pager [phone] (M-F; 7a - 5p)  Please contact [facility] for night-coverage after hours (5p -7a ) and weekends on [email or web address]

## 2024-03-27 NOTE — Assessment & Plan Note (Signed)
 Hold on aspirin  due to anemia., Resume statin.  Elevation in high sensitive troponin due to heart failure, no acute coronary syndrome.

## 2024-03-27 NOTE — Assessment & Plan Note (Addendum)
 AKI, hyponatremia,   Today renal function with serum cr trending down to 1,86 with K at 4.1 and serum bicarbonate at 23 Na 132   Continue close monitoring renal function and electrolytes.  Holding diuretic for now.

## 2024-03-27 NOTE — Assessment & Plan Note (Signed)
 Continue losartan  for blood pressure control.

## 2024-03-27 NOTE — Assessment & Plan Note (Signed)
 Continue statin therapy

## 2024-03-28 DIAGNOSIS — Z789 Other specified health status: Secondary | ICD-10-CM

## 2024-03-28 DIAGNOSIS — Z515 Encounter for palliative care: Secondary | ICD-10-CM | POA: Diagnosis not present

## 2024-03-28 DIAGNOSIS — Z7189 Other specified counseling: Secondary | ICD-10-CM | POA: Diagnosis not present

## 2024-03-28 DIAGNOSIS — Z66 Do not resuscitate: Secondary | ICD-10-CM | POA: Diagnosis not present

## 2024-03-28 DIAGNOSIS — N1831 Chronic kidney disease, stage 3a: Secondary | ICD-10-CM | POA: Diagnosis not present

## 2024-03-28 DIAGNOSIS — I739 Peripheral vascular disease, unspecified: Secondary | ICD-10-CM | POA: Diagnosis not present

## 2024-03-28 DIAGNOSIS — D649 Anemia, unspecified: Secondary | ICD-10-CM

## 2024-03-28 DIAGNOSIS — I5023 Acute on chronic systolic (congestive) heart failure: Secondary | ICD-10-CM | POA: Diagnosis not present

## 2024-03-28 DIAGNOSIS — I10 Essential (primary) hypertension: Secondary | ICD-10-CM | POA: Diagnosis not present

## 2024-03-28 DIAGNOSIS — I249 Acute ischemic heart disease, unspecified: Secondary | ICD-10-CM | POA: Diagnosis not present

## 2024-03-28 DIAGNOSIS — I350 Nonrheumatic aortic (valve) stenosis: Secondary | ICD-10-CM

## 2024-03-28 LAB — BASIC METABOLIC PANEL WITH GFR
Anion gap: 16 — ABNORMAL HIGH (ref 5–15)
BUN: 87 mg/dL — ABNORMAL HIGH (ref 8–23)
CO2: 21 mmol/L — ABNORMAL LOW (ref 22–32)
Calcium: 8.3 mg/dL — ABNORMAL LOW (ref 8.9–10.3)
Chloride: 98 mmol/L (ref 98–111)
Creatinine, Ser: 2.35 mg/dL — ABNORMAL HIGH (ref 0.61–1.24)
GFR, Estimated: 26 mL/min — ABNORMAL LOW (ref >60)
Glucose, Bld: 111 mg/dL — ABNORMAL HIGH (ref 70–99)
Potassium: 4.1 mmol/L (ref 3.5–5.1)
Sodium: 135 mmol/L (ref 135–145)

## 2024-03-28 LAB — CBC
HCT: 21.9 % — ABNORMAL LOW (ref 39.0–52.0)
Hemoglobin: 7.2 g/dL — ABNORMAL LOW (ref 13.0–17.0)
MCH: 34.3 pg — ABNORMAL HIGH (ref 26.0–34.0)
MCHC: 32.9 g/dL (ref 30.0–36.0)
MCV: 104.3 fL — ABNORMAL HIGH (ref 80.0–100.0)
Platelets: 76 K/uL — ABNORMAL LOW (ref 150–400)
RBC: 2.1 MIL/uL — ABNORMAL LOW (ref 4.22–5.81)
RDW: 20.2 % — ABNORMAL HIGH (ref 11.5–15.5)
WBC: 4.9 K/uL (ref 4.0–10.5)
nRBC: 2.2 % — ABNORMAL HIGH (ref 0.0–0.2)

## 2024-03-28 LAB — COOXEMETRY PANEL
Carboxyhemoglobin: 1.4 % (ref 0.5–1.5)
Carboxyhemoglobin: 1.5 % (ref 0.5–1.5)
Methemoglobin: <0.7 % (ref 0.0–1.5)
Methemoglobin: <0.7 % (ref 0.0–1.5)
O2 Saturation: 56.5 %
O2 Saturation: 48.2 %
Total hemoglobin: 8.5 g/dL — ABNORMAL LOW (ref 12.0–16.0)
Total hemoglobin: 7.9 g/dL — ABNORMAL LOW (ref 12.0–16.0)

## 2024-03-28 LAB — MAGNESIUM: Magnesium: 2.1 mg/dL (ref 1.7–2.4)

## 2024-03-28 LAB — GLUCOSE, CAPILLARY: Glucose-Capillary: 133 mg/dL — ABNORMAL HIGH (ref 70–99)

## 2024-03-28 NOTE — Progress Notes (Signed)
 Daily Progress Note   Date: 03/28/2024   Patient Name: Danny [name]  DOB: 1934/04/03  MRN: [id number]  Age / Sex: 89 y.o., male  Attending Physician: Noralee Elidia Sieving,* Primary Care Physician: [name] LABOR, MD Admit Date: 03/25/2024 Length of Stay: 2 days  Reason for Follow-up: Establishing goals of care  Past Medical History:  Diagnosis Date   (HFpEF) heart failure with preserved ejection fraction (HCC)    Echo 1/23: Inferior AK, mild aortic stenosis (mean 9 mmHg, V-max 210.5 cm/s, DI 0.70), EF 55-60, GR 1 DD, normal RVSF, normal PASP, trivial MR, mild AI, borderline dilation of aortic root (39 mm)   Acid reflux    hiatal hernia   Anemia    Bladder cancer (HCC)    CAD (coronary artery disease)    S/p non-STEMI 1/23 >> s/p CABG   Carotid artery disease (HCC) 08/23/2021   Pre-CABG Dopplers 1/23:R ICA 40-59; L ICA 1-39 // Carotid US  07/20/2023: RICA 40-59; LICA 1-39   CHF (congestive heart failure) (HCC)    Chronic kidney disease (CKD)    Coronary artery disease involving native coronary artery of native heart without angina pectoris 05/16/2022   Inf NSTEMI s/p CABG in 07/2021 (RIMA-LAD, LIMA-OM1, S-OM2)   Diverticulitis    Erectile dysfunction 04/20/2015   Essential hypertension 04/20/2015   History of colon polyps    History of kidney stones    History of stroke    Noted on brain MRI 07/2021   Hyperlipidemia 05/28/2015   PAD (peripheral artery disease) (HCC)    Pre-CABG Dopplers 1/23: Right ABI 0.65; left ABI 0.82   Skin cancer    Stroke (HCC)    noted MRI 2023 pt. unaware    Subjective:   Subjective: Chart Reviewed. Updates received. Patient Assessed. Created space and opportunity for patient  and family to explore thoughts and feelings regarding current medical situation.  Today's Discussion: Today before meeting with the patient/family, I reviewed the chart notes including OT note from yesterday, internal medicine note from yesterday, cardiology/heart  failure note from today, multidisciplinary heart valve team note from today, chaplain note from today. I also reviewed vital signs, nursing flowsheets, medication administrations record, labs, and imaging. Labs reviewed include BMP with some improvement in creatinine from 2.6 yesterday to 2.35 today in the setting of acute worsening of heart failure, cardiology team plans to stop milrinone  and monitor over the weekend.  Cooximetry is stable at 56.5 today.  Anemia also stable at 7.2 (7.6 yesterday).  Today saw the patient at bedside, no family was present.  We talked about speaking with his daughter yesterday and how she was quite upset that she was in the middle come to see him because her car broke down.  He notes that he was able to talk to her and tell her it is okay that she cannot prevent the car from breaking down.  He is expecting that she is going to come today between 2 and 3:00 to visit, has secured a ride to see him.  Today he states he feels a lot better.  He got up with mobility and got around the room, denies any shortness of breath during ambulation.  We talked about cardiology notes and his visit with the cardiologist today.  He notes and agrees that the plan is to monitor his kidneys over the weekend.  They will possibly work him up for potential options to help his heart valve early next week if his kidneys remain stable.  He  is hopeful and praying that they will be able to do something to help him be able to get home with his family.  We again talked about CODE STATUS, as outlined below as well.  He states that he did have a conversation with his daughter and he does not think he would want CPR or breathing tubes.  Talked about this is a reasonable decision given his age, comorbidities, and severe weakness of his heart.  I shared that I would change his CODE STATUS to DNR/DNI.  I told him I would try to call and tell his daughter as well.  I shared that palliative medicine will back off  for couple days to allow time for outcomes.  I will check back in on Tuesday of next week when I am back on service.  I noted that the medical team and nursing team can reach out to palliative medicine if we are needed back before then.  After seeing the patient I attempted to call his daughter twice to update her.  The first time I left a voicemail requesting callback.  About an hour and a half later I attempted again and her voicemail box is now full.  I provided emotional and general support through therapeutic listening, empathy, sharing of stories, and other techniques. I answered all questions and addressed all concerns to the best of my ability.  Review of Systems  Constitutional:        Feels better overall  Respiratory:  Negative for chest tightness and shortness of breath.   Cardiovascular:  Negative for chest pain.  Gastrointestinal:  Negative for abdominal pain, nausea and vomiting.    Objective:   Primary Diagnoses: Present on Admission:  Acute on chronic systolic CHF (congestive heart failure) (HCC)  Macrocytic anemia  Hyperlipidemia  Thrombocytopenia (HCC)  Essential hypertension  Chronic kidney disease, stage 3a (HCC)  PAD (peripheral artery disease) (HCC)   Vital Signs:  BP 95/70 (BP Location: Left Arm)   Pulse 86   Temp 97.7 F (36.5 C) (Oral)   Resp 13   Ht 5' 4 (1.626 m)   Wt 58 kg   SpO2 100%   BMI 21.95 kg/m   Physical Exam Vitals and nursing note reviewed.  Constitutional:      General: He is not in acute distress.    Appearance: He is ill-appearing. He is not toxic-appearing.     Comments: Sitting in the bedside chair  HENT:     Head: Normocephalic and atraumatic.  Cardiovascular:     Rate and Rhythm: Normal rate.  Pulmonary:     Effort: Pulmonary effort is normal. No respiratory distress.     Breath sounds: No wheezing or rhonchi.  Abdominal:     General: Abdomen is flat. Bowel sounds are normal. There is no distension.     Palpations:  Abdomen is soft.  Skin:    General: Skin is warm and dry.  Neurological:     General: No focal deficit present.     Mental Status: He is alert and oriented to person, place, and time.  Psychiatric:        Mood and Affect: Mood normal.        Behavior: Behavior normal.     Palliative Assessment/Data: 60-70%   Advanced Care Planning:   Existing Vynca/ACP Documentation: None  Primary Decision Maker: PATIENT  Pertinent diagnosis: Severe heart failure, AKI, cardiogenic shock  The patient and/or family consented to a voluntary Advance Care Planning Conversation in person. Individuals  present for the conversation: The patient, Camellia Kays, NP  Summary of the conversation: We discussed CODE STATUS including statistical outcomes in patients with similar clinical pictures, the reality of cardiopulmonary resuscitation.  He states that he has talked with his daughter about this yesterday and does not feel he would want CPR or breathing tubes.  He agrees that if for some reason his heart were to stop and he were to pass away to let him go peacefully.  Outcome of the conversations and/or documents completed: Change CODE STATUS to DNR-limited (DNR/DNI)  I spent 20 minutes providing separately identifiable ACP services with the patient and/or surrogate decision maker in a voluntary, in-person conversation discussing the patient's wishes and goals as detailed in the above note.  Assessment & Plan:   HPI/Patient Profile:  89 y.o. male  with past medical history of heart failure with preserved ejection fraction, hyperlipidemia, CAD s/p CABG in 2023, CKD, and recurrent bladder cancer s/p TURP who presented with complaints of generalized weakness and fatigue over the last 5 days.  He was admitted on 03/25/2024 with HFpEF, elevated troponin macrocytic anemia, AKI superimposed on CKD 3B, transaminitis papillary bladder tumor, and others.  After workup determined to be in acute systolic heart failure with EF  less than 20%, severe aortic stenosis, cardiogenic shock, lactic acidosis, AKI, acute liver failure, LBBB, and others.   Palliative medicine was consulted for GOC conversations.  SUMMARY OF RECOMMENDATIONS   Changed to DNR-limited Continue full scope of care otherwise Time for outcomes over the weekend, especially related to kidney function Continued supportive patient and family Ongoing goals of care as needed based on clinical picture Palliative medicine will back off over the weekend to allow time for outcomes We will reengage Tuesday, 04/01/2024 Please notify us  of any significant clinical change or new needs in the interim  Symptom Management:  Per primary team Palliative medicine is available to assist as needed  Code Status: DNR - Limited (DNR/DNI)  Prognosis: Unable to determine  Discharge Planning: To Be Determined  Discussed with: Patient, medical team, nursing team  Thank you for allowing us  to participate in the care of Italo R Woodin PMT will continue to support holistically.  Billing based on MDM: High  Problems Addressed: One or more chronic illnesses with severe exacerbation, progression, or side effects of treatment.  Risks: Decision not to resuscitate or to de-escalate care because of poor prognosis  Detailed review of medical records (labs, imaging, vital signs), medically appropriate exam, discussed with treatment team, counseling and education to patient, family, & staff, documenting clinical information, medication management, coordination of care  Camellia Kays, NP Palliative Medicine Team  Team Phone # [phone] (Nights/Weekends)  03/08/2021, 8:17 AM

## 2024-03-28 NOTE — Assessment & Plan Note (Addendum)
 Thrombocytopenia.  Follow up hgb is 8.1 with plt 80 and wbc 6.6  Iron panel with serum iron at 146, TIBC 325, transferrin saturation 45 and ferritin 582   Possible anemia related to aortic stenosis.  Low platelet may suggest bone marrow failure.  Stable cell count, no indication for transfusion.

## 2024-03-28 NOTE — Progress Notes (Signed)
 Patient had NSVT - 11 beats. Asymptomatic. Dr. Charlton aware.

## 2024-03-28 NOTE — Assessment & Plan Note (Signed)
 Follow up as outpatient

## 2024-03-28 NOTE — Progress Notes (Addendum)
 Advanced Heart Failure Rounding Note  Cardiologist: None  Chief Complaint: Heart Failure/AS Subjective:   9/17- Diuresed with IV lasix .   Yesterday diuretics held.   Feels better today. Denies SOB  Objective:   Weight Range: 58 kg Body mass index is 21.95 kg/m.   Vital Signs:   Temp:  [97.5 F (36.4 C)-98 F (36.7 C)] 97.7 F (36.5 C) (09/19 0733) Pulse Rate:  [66-86] 86 (09/19 0733) Resp:  [12-24] 17 (09/19 0733) BP: (104-123)/(59-86) 117/70 (09/19 0733) SpO2:  [100 %] 100 % (09/19 0733) Weight:  [58 kg] 58 kg (09/19 0440) Last BM Date : 03/25/24 (MD paged for prn med; usually goes every day - every other day)  Weight change: Filed Weights   03/26/24 1210 03/27/24 0423 03/28/24 0440  Weight: 58.1 kg 58.4 kg 58 kg    Intake/Output:   Intake/Output Summary (Last 24 hours) at 03/28/2024 0819 Last data filed at 03/28/2024 0534 Gross per 24 hour  Intake 568.36 ml  Output 2475 ml  Net -1906.64 ml     CVP 5-6   Physical Exam    General:   No resp difficulty Neck: no JVD.  Cor: Regular rate & rhythm. RUSB AS Lungs: clear Abdomen: soft, nontender, nondistended.  Extremities: no  edema Neuro: alert & oriented x3   Telemetry   SR 70-80s  EKG    N/A  Labs    CBC Recent Labs    03/27/24 0430 03/28/24 0500  WBC 5.8 4.9  HGB 7.6* 7.2*  HCT 22.5* 21.9*  MCV 104.2* 104.3*  PLT 82* 76*   Basic Metabolic Panel Recent Labs    90/82/74 0404 03/27/24 0430 03/28/24 0500  NA 132* 134* 135  K 5.1 4.6 4.1  CL 104 98 98  CO2 18* 21* 21*  GLUCOSE 108* 138* 111*  BUN 74* 88* 87*  CREATININE 2.37* 2.68* 2.35*  CALCIUM  8.5* 8.4* 8.3*  MG 2.5* 2.3 2.1  PHOS 6.1*  --   --    Liver Function Tests Recent Labs    03/26/24 0404 03/27/24 0430  AST 216* 200*  ALT 226* 258*  ALKPHOS 121 159*  BILITOT 2.1* 1.8*  PROT 6.4* 6.0*  ALBUMIN  3.0* 2.9*   Recent Labs    03/25/24 1920  LIPASE 100*   Cardiac Enzymes No results for input(s):  CKTOTAL, CKMB, CKMBINDEX, TROPONINI in the last 72 hours.  BNP: BNP (last 3 results) Recent Labs    03/26/24 0404  BNP >4,500.0*    ProBNP (last 3 results) No results for input(s): PROBNP in the last 8760 hours.   D-Dimer No results for input(s): DDIMER in the last 72 hours. Hemoglobin A1C No results for input(s): HGBA1C in the last 72 hours. Fasting Lipid Panel No results for input(s): CHOL, HDL, LDLCALC, TRIG, CHOLHDL, LDLDIRECT in the last 72 hours. Thyroid  Function Tests Recent Labs    03/26/24 0404  TSH 3.014    Other results:   Imaging    No results found.    Medications:     Scheduled Medications:  Chlorhexidine  Gluconate Cloth  6 each Topical Daily   rosuvastatin   10 mg Oral Daily   sodium chloride  flush  3 mL Intravenous Q12H    Infusions:  milrinone  0.125 mcg/kg/min (03/27/24 1956)    PRN Medications: acetaminophen , mouth rinse, senna, sodium chloride  flush, trimethobenzamide     Patient Profile   89 y.o. male with history of CAD w/ prior NSTEMI in 2023 s/p CABG, AS, ischemic CM with prior  recovery in EF, CKD IIIa, hx bradycardia, bladder cancer s/p TURPT, chronic anemia.     Admitted with acute systolic heart failure/cardiogenic shock in setting of severe AS and acute on chronic anemia.   Assessment/Plan   Acute systolic CHF>cardiogenic shock -EF 50-55% in 06/25 w/ reported moderate AS -Echo this admit: EF 15-20% w/ severe AS on preliminary review -Lactic acid 2.2>2.3>1.9 -Etiology for drop in EF not certain. Could be d/t AS. Also has LBBB w/ wide QRS.  -CO-OX stable. Stop milrinone .  CVP 5-6. Hold diuretics.  GDMT to limited with shock/AKI   Severe AS -Noted on echo this admit -Not certain if he would be a candidate for TAVR if he recovers from shock -Structural Team consulted.    CAD Elevated troponin -NSTEMI 2023 s/p CABG X 3 -HS troponin 484 -Suspect demand ischemia -Statin on hold d/t shock  liver. No aspirin  with significant anemia.   Acute on chronic macrocytic anemia -Hbgb previously 9s-10s, 8.3 in April. 7.2 on admit s/p 1 u RBCs -FOBT X 1 negative. Hgb down to 7.2 today. No obvious source.   -? AVMs in setting of severe AS   AKI on CKD IIIa -Baseline Scr 1.2-1.3, peaked at  2.7 , today 2.3   -Hold diuretics.   Elevated LFTs -Suspect shock liver -AST 205>216> 200 -ALT 207>226> 258 -Tbili 1.2>2.1>1.8  -US  abdomen with biliary sludge and gallbladder wall thickening   GOC: Currently full code. Consider DNR status. Palliative Care consult.   Length of Stay: 2  Greig Mosses, NP  03/28/2024, 8:19 AM  Advanced Heart Failure Team Pager [phone] (M-F; 7a - 5p)  Please contact [facility] for night-coverage after hours (5p -7a ) and weekends on [email or web address]

## 2024-03-28 NOTE — Plan of Care (Signed)
  Problem: Clinical Measurements: Goal: Diagnostic test results will improve Outcome: Progressing   Problem: Education: Goal: Knowledge of General Education information will improve Description: Including pain rating scale, medication(s)/side effects and non-pharmacologic comfort measures Outcome: Progressing   Problem: Clinical Measurements: Goal: Will remain free from infection Outcome: Progressing   Problem: Activity: Goal: Risk for activity intolerance will decrease Outcome: Progressing   Problem: Nutrition: Goal: Adequate nutrition will be maintained Outcome: Progressing   Problem: Pain Managment: Goal: General experience of comfort will improve and/or be controlled Outcome: Progressing   Problem: Skin Integrity: Goal: Risk for impaired skin integrity will decrease Outcome: Progressing   Problem: Activity: Goal: Capacity to carry out activities will improve Outcome: Progressing   Problem: Cardiac: Goal: Ability to achieve and maintain adequate cardiopulmonary perfusion will improve Outcome: Progressing

## 2024-03-28 NOTE — Progress Notes (Signed)
 Physical Therapy Treatment Patient Details Name: Danny [name] MRN: [id number] DOB: 11/21/1933 Today's Date: 03/28/2024   History of Present Illness Pt is [name] 89 y/o male admitted with c/o generalized weakness and fatigue over the last 5 days.  Work up showed HF with preserved EF, elevated troponin, AKI and Severe AS.  PMHx:   CAD, CHF, CKD, HTN, PAD, stroke,   CABG    PT Comments  Pt has progressed steadily toward goals, but is held back by his AS.  Pt hopes to have surgical intervention on Aortic valve.  Emphasis on standing exercises and gait with the RW on 2-3 L   If plan is discharge home, recommend the following:  (prn assist of dtr.)   Can travel by private vehicle        Equipment Recommendations  None recommended by PT    Recommendations for Other Services       Precautions / Restrictions Precautions Precautions: Fall (lower risk) Recall of Precautions/Restrictions: Intact     Mobility  Bed Mobility Overal bed mobility: Needs Assistance Bed Mobility: Supine to Sit, Sit to Supine     Supine to sit: Supervision     General bed mobility comments: used UE's appropriately for assist, but flatter bed and no rails    Transfers Overall transfer level: Needs assistance   Transfers: Sit to/from Stand Sit to Stand: Contact guard assist           General transfer comment: used UE's safely and appropriately, no assist needed.    Ambulation/Gait Ambulation/Gait assistance: Contact guard assist Gait Distance (Feet): 50 Feet Assistive device: Rolling walker (2 wheels) Gait Pattern/deviations: Step-through pattern   Gait velocity interpretation: <1.8 ft/sec, indicate of risk for recurrent falls   General Gait Details: Improvements in weakness.  Still some fatigue.  Used O2 today with good response and sats in the lower 90's.   Stairs             Wheelchair Mobility     Tilt Bed    Modified Rankin (Stroke Patients Only)       Balance Overall  balance assessment: Needs assistance Sitting-balance support: No upper extremity supported, Feet supported Sitting balance-Leahy Scale: Good     Standing balance support: No upper extremity supported, Bilateral upper extremity supported Standing balance-Leahy Scale: Fair                              Hotel manager: No apparent difficulties  Cognition Arousal: Alert Behavior During Therapy: WFL for tasks assessed/performed   PT - Cognitive impairments: No apparent impairments                         Following commands: Intact      Cueing Cueing Techniques: Verbal cues  Exercises General Exercises - Lower Extremity Hip Flexion/Marching: AROM, Strengthening, Both, 10 reps, Standing    General Comments        Pertinent Vitals/Pain Pain Assessment Pain Assessment: Faces Faces Pain Scale: No hurt    Home Living Family/patient expects to be discharged to:: Private residence Living Arrangements: Spouse/significant other;Children (dtr) Available Help at Discharge: Family;Available 24 hours/day Type of Home: House Home Access: Ramped entrance       Home Layout: One level Home Equipment: Agricultural consultant (2 wheels);Cane - single point;BSC/3in1;Shower seat;Grab bars - toilet;Grab bars - tub/shower;Wheelchair - manual      Prior Function  PT Goals (current goals can now be found in the care plan section) Acute Rehab PT Goals Patient Stated Goal: return to independence.  able to assist dtr with my wife. PT Goal Formulation: With patient Time For Goal Achievement: 04/10/24 Potential to Achieve Goals: Good Progress towards PT goals: Progressing toward goals    Frequency    Min 3X/week      PT Plan      Co-evaluation              AM-PAC PT 6 Clicks Mobility   Outcome Measure  Help needed turning from your back to your side while in a flat bed without using bedrails?: A Little Help needed moving  from lying on your back to sitting on the side of a flat bed without using bedrails?: A Little Help needed moving to and from a bed to a chair (including a wheelchair)?: A Little Help needed standing up from a chair using your arms (e.g., wheelchair or bedside chair)?: A Little Help needed to walk in hospital room?: A Little Help needed climbing 3-5 steps with a railing? : A Little 6 Click Score: 18    End of Session Equipment Utilized During Treatment: Oxygen Activity Tolerance: Patient tolerated treatment well;Patient limited by fatigue Patient left: in chair;with call bell/phone within reach Nurse Communication: Mobility status PT Visit Diagnosis: Other abnormalities of gait and mobility (R26.89);Muscle weakness (generalized) (M62.81);Difficulty in walking, not elsewhere classified (R26.2)     Time: 8446-8374 PT Time Calculation (min) (ACUTE ONLY): 32 min  Charges:    $Gait Training: 8-22 mins $Therapeutic Exercise: 8-22 mins PT General Charges $$ ACUTE PT VISIT: 1 Visit                     03/28/2024  Danny [name]., PT Acute Rehabilitation Services [phone]  (office)   Danny [name] 03/28/2024, 5:31 PM

## 2024-03-28 NOTE — Progress Notes (Signed)
 Progress Note   Patient: Danny [name] FMW:[id number] DOB: 1934/06/09 DOA: 03/25/2024     2 DOS: the patient was seen and examined on 03/28/2024   Brief hospital course: Mr. Prichard was admitted to the hospital with the working diagnosis of heart failure exacerbation.   89 yo male with the past medical history of heart failure, aortic stenosis, coronary artery disease, CKD, and bladder cancer sp TURP who presented with generalized weakness and fatigue.  On his initial physical examination his blood pressure 117/79, HR 78, RR 19 and 02 saturation 90%  Lungs with no wheezing or rhonchi, heart with S1 and S2 present and regular, positive systolic murmur at the base, abdomen soft and not distended and positive lower extremity edema.   Echocardiogram with reduced LV systolic function, with signs of hypoperfusion.  Placed on milrinone  for inotropic support.   09/19 off milrinone . Consulted structural heart team.   Assessment and Plan: Acute on chronic systolic CHF (congestive heart failure) (HCC) Echocardiogram with reduced LV systolic function < 20%, global hypokinesis, RV with moderate reduction in systolic function, LA with severe dilatation, RA with moderate dilatation, severe mitral valve regurgitation, moderate to severe tricuspid valve regurgitation, low flow severe aortic valve stenosis with moderate regurgitation, moderate pulmonary valve regurgitation,   Urine output is 2,475 ml Systolic blood pressure 100 mmHg.  Sv02 56   09/19 off milrinone   Holding on diuretic therapy for now.  Poor prognosis due to valvular heart disease.  Consulted structural heart team.   Congested hepatopathy die to volume overload.  Follow AST and ALT.  Continue supportive medical care. No hepatic encephalopathy.   Essential hypertension Continue blood pressure monitoring   Chronic kidney disease, stage 3a (HCC) AKI, hyponatremia,   Renal function with serum cr at 2,35 with K at 4,1 and serum  bicarbonate at 21  Na 135 Mg 2.1   Continue close monitoring renal function and electrolytes.   Coronary artery disease Hold on aspirin  due to anemia., Continue with statin.  Elevation in high sensitive troponin due to heart failure, no acute coronary syndrome.   PAD (peripheral artery disease) (HCC) Continue supportive medical care  Hyperlipidemia Continue statin therapy  Macrocytic anemia Thrombocytopenia.  Follow up hgb is 7,6 with plt 76 and wbc 4.9  Iron panel with serum iron at 146, TIBC 325, transferrin saturation 45 and ferritin 582   Possible anemia related to aortic stenosis.  Low platelet may suggest bone marrow failure.  Follow up cell count in am. Hold PRBC transfusion for now.   History of bladder cancer Follow up as outpatient.     Subjective: patient is feeling better, he had been out of bed to the chair and ambulating with physical therapy   Physical Exam: Vitals:   03/28/24 1200 03/28/24 1400 03/28/24 1449 03/28/24 1552  BP:    107/65  Pulse: 95 71    Resp: 19 12 17  (!) 21  Temp:    97.6 F (36.4 C)  TempSrc:    Oral  SpO2: 97% 100%  97%  Weight:      Height:       Neurology awake and alert ENT with mild pallor Cardiovascular with S1 and S2 present and regular with no gallops or rubs, positive systolic murmur at the base No JVD Respiratory with no rales or wheezing, no rhonchi  Abdomen with no distention  No lower extremity edema   Data Reviewed:    Family Communication: no family at the bedside   Disposition: Status  is: Inpatient Remains inpatient appropriate because: recovering from heart failure   Planned Discharge Destination: Home     Author: Elidia Toribio Furnace, MD 03/28/2024 4:53 PM  For on call review [email or web address].

## 2024-03-28 NOTE — Progress Notes (Signed)
 Mobility Specialist Progress Note:    03/28/24 1132  Mobility  Activity Ambulated with assistance  Level of Assistance Standby assist, set-up cues, supervision of patient - no hands on  Assistive Device Front wheel walker  Distance Ambulated (ft) 25 ft  Activity Response Tolerated well  Mobility Referral Yes  Mobility visit 1 Mobility  Mobility Specialist Start Time (ACUTE ONLY) 1132  Mobility Specialist Stop Time (ACUTE ONLY) 1153  Mobility Specialist Time Calculation (min) (ACUTE ONLY) 21 min   Pt pleasant and agreeable to session. No c/o any symptoms. Pt able to sup>sit w/ no assist and ambulate w/ SV. Pt somewhat impulsive and has to be told to slow down or watch out for cords and such. Returned pt to recliner w/ all needs met and chaplain in room.   Venetia Keel Mobility Specialist Please Neurosurgeon or Rehab Office at [phone]

## 2024-03-28 NOTE — Consult Note (Addendum)
 [facility]   MULTIDISCIPLINARY HEART VALVE TEAM  Cardiology Consultation:   Patient ID: Danny [name] MRN: [id number]; DOB: 08-Jul-1934  Admit date: 03/25/2024 Date of Consult: 03/28/2024  Primary Care Provider: [name] LABOR, MD [facility] Cardiologist: None  [facility] Electrophysiologist:  None    Patient Profile:   Danny [name] is a 89 y.o. male with a hx of CAD w/ prior NSTEMI 07/2021 s/p CABG (RIMA to LAD, LIMA to OM1, SVG to OM2), ischemic cardiomyopathy, hx CVA, PAD, bladder cancer, CKD IIIa, LBBB and aortic valve stenosis currently admitted with cardiogenic shock who is being seen today for the evaluation of severe LFLG AS at the request of Dr. Zenaida  History of Present Illness:   EF previously 40-45% on LV gram at time of cath/NSTEMI in 07/2021. Aortic stenosis noted to be mild at that time. EF improved to 55-60% on follow-up echo. Last echo 12/2023 showed EF 50-55%, RWMA w/ basal inferior and basal inferoseptal severe HK, RV okay, moderate AS-severe severely calcified valve with mean gradient of 23 mmHg and AVA 1.1 cm2 by VTI.  Danny [name] lives at home with his wife who suffers from dementia and is her primary caregiver. He was previously very independent driving, grocery shopping and doing day to day ADLs without difficulty. Over the past several weeks he has suffered a rapid decline with worsening dyspnea, weakness, anorexia and abdominal bloating. He presented to the ED 03/25/24 where labs were significant for: Scr 2.2, K 5.4, Na 133, lipase 100, AST 205, ALT 207, HS troponin 484, lactic acid 2.2>2.3>1.9, hgb 7.2, BNP > 4500. ECG with SR, known LBBB w/ QRS > 150 ms, PACs. FOBT negative. Abdominal US  with evidence of biliary sludge and gallbladder wall thickening. He was given IVF, tylenol  and lokelma . He was admitted for evaluation and management of acute on chronic HFpEF, acute on chronic anemia, AKI on CKD IIIb and elevated LFTs. Overall picture  concerning for cardiogenic shock. Advanced Heart Failure asked to see to assist with management. Repeat TTE 03/26/24 w/ LVEF <20%, G2DD, moderate RV dysfunction, severe MR, mod-severe TR, moderate PR, moderate pleural effusion in lateral region, and severe LFLG aortic stenosis with mean gradient 17 mm hg, Vmax 2.85 m/s, AVA 0.77cm2, and moderate AI.   He is seen sitting alone eating lunch. He is breathing much better than presentation but still not at baseline. He is anxious to get anything done that get him back to his life and family.     Past Medical History:  Diagnosis Date   (HFpEF) heart failure with preserved ejection fraction (HCC)    Echo 1/23: Inferior AK, mild aortic stenosis (mean 9 mmHg, V-max 210.5 cm/s, DI 0.70), EF 55-60, GR 1 DD, normal RVSF, normal PASP, trivial MR, mild AI, borderline dilation of aortic root (39 mm)   Acid reflux    hiatal hernia   Anemia    Bladder cancer (HCC)    CAD (coronary artery disease)    S/p non-STEMI 1/23 >> s/p CABG   Carotid artery disease (HCC) 08/23/2021   Pre-CABG Dopplers 1/23:R ICA 40-59; L ICA 1-39 // Carotid US  07/20/2023: RICA 40-59; LICA 1-39   CHF (congestive heart failure) (HCC)    Chronic kidney disease (CKD)    Coronary artery disease involving native coronary artery of native heart without angina pectoris 05/16/2022   Inf NSTEMI s/p CABG in 07/2021 (RIMA-LAD, LIMA-OM1, S-OM2)   Diverticulitis    Erectile dysfunction 04/20/2015   Essential hypertension 04/20/2015  History of colon polyps    History of kidney stones    History of stroke    Noted on brain MRI 07/2021   Hyperlipidemia 05/28/2015   PAD (peripheral artery disease) (HCC)    Pre-CABG Dopplers 1/23: Right ABI 0.65; left ABI 0.82   Skin cancer    Stroke [facility])    noted MRI 2023 pt. unaware    Past Surgical History:  Procedure Laterality Date   APPENDECTOMY     cataract surgery     CORONARY ARTERY BYPASS GRAFT N/A 08/01/2021   Procedure: CORONARY ARTERY BYPASS  GRAFTING (CABG) X 3  ,ON PUMP, USING LEFT AND RIGHT INTERNAL MAMMARY ARTERIES, RIGHT AND LEFT ENDOSCOPIC GREATER SAPHENOUS VEIN CONDUITS;  Surgeon: Lucas Dorise POUR, MD;  Location: [facility] OR;  Service: Open Heart Surgery;  Laterality: N/A;   ENDOVEIN HARVEST OF GREATER SAPHENOUS VEIN Right 08/01/2021   Procedure: ENDOVEIN HARVEST OF GREATER SAPHENOUS VEIN;  Surgeon: Lucas Dorise POUR, MD;  Location: [facility] OR;  Service: Open Heart Surgery;  Laterality: Right;   LEFT HEART CATH AND CORONARY ANGIOGRAPHY N/A 07/27/2021   Procedure: LEFT HEART CATH AND CORONARY ANGIOGRAPHY;  Surgeon: Darron Deatrice LABOR, MD;  Location: [facility] INVASIVE CV LAB;  Service: Cardiovascular;  Laterality: N/A;   SKIN CANCER EXCISION     TEE WITHOUT CARDIOVERSION N/A 08/01/2021   Procedure: TRANSESOPHAGEAL ECHOCARDIOGRAM (TEE);  Surgeon: Lucas Dorise POUR, MD;  Location: [facility] OR;  Service: Open Heart Surgery;  Laterality: N/A;   TRANSURETHRAL RESECTION OF BLADDER TUMOR N/A 01/03/2023   Procedure: TRANSURETHRAL RESECTION OF BLADDER TUMOR (TURBT);  Surgeon: Devere Lonni Righter, MD;  Location: [facility] ORS;  Service: Urology;  Laterality: N/A;  90 MINUTES   TRANSURETHRAL RESECTION OF BLADDER TUMOR N/A 10/10/2023   Procedure: TURBT (TRANSURETHRAL RESECTION OF BLADDER TUMOR);  Surgeon: Devere Lonni Righter, MD;  Location: [facility] ORS;  Service: Urology;  Laterality: N/A;   TRANSURETHRAL RESECTION OF PROSTATE       Home Medications:  Prior to Admission medications   Medication Sig Start Date End Date Taking? Authorizing Provider  aspirin  EC 81 MG tablet Take 1 tablet (81 mg total) by mouth daily. Swallow whole. 11/06/23  Yes Weaver, Scott T, PA-C  nitroGLYCERIN  (NITROSTAT ) 0.4 MG SL tablet Place 1 tablet (0.4 mg total) under the tongue every 5 (five) minutes as needed for chest pain. 05/16/22 03/26/25 Yes Weaver, Scott T, PA-C  rosuvastatin  (CRESTOR ) 20 MG tablet Take 1 tablet (20 mg total) by mouth daily. 01/22/24  Yes Weaver, Scott T, PA-C  oxybutynin   (DITROPAN ) 5 MG tablet Take 1 tablet (5 mg total) by mouth every 8 (eight) hours as needed for bladder spasms. Patient not taking: Reported on 03/26/2024 10/10/23   Devere Lonni Righter, MD  phenazopyridine  (PYRIDIUM ) 100 MG tablet Take 1 tablet (100 mg total) by mouth 3 (three) times daily as needed for pain. Patient not taking: Reported on 03/26/2024 10/10/23 10/09/24  Devere Lonni Righter, MD    Inpatient Medications: Scheduled Meds:  Chlorhexidine  Gluconate Cloth  6 each Topical Daily   rosuvastatin   10 mg Oral Daily   sodium chloride  flush  3 mL Intravenous Q12H   Continuous Infusions:  PRN Meds: acetaminophen , mouth rinse, senna, sodium chloride  flush, trimethobenzamide   Allergies:    Allergies  Allergen Reactions   Lipitor [Atorvastatin]     Muscle soreness    Social History:   Social History   Socioeconomic History   Marital status: Married    Spouse name: Not on file  Number of children: 1   Years of education: 12   Highest education level: Not on file  Occupational History   Occupation: Retired  Tobacco Use   Smoking status: Former    Types: Cigars   Smokeless tobacco: Never   Tobacco comments:    Quit 2016  Vaping Use   Vaping status: Never Used  Substance and Sexual Activity   Alcohol use: Yes    Comment: Occasional beer   Drug use: No   Sexual activity: Not Currently  Other Topics Concern   Not on file  Social History Narrative   Lives with wife and daughter lives with them also/2025   Caffeine use: 1-2 cups coffee in the am and 1 glass tea qpm   Social Drivers of Health   Financial Resource Strain: Medium Risk (11/23/2023)   Overall Financial Resource Strain (CARDIA)    Difficulty of Paying Living Expenses: Somewhat hard  Food Insecurity: No Food Insecurity (03/26/2024)   Hunger Vital Sign    Worried About Running Out of Food in the Last Year: Never true    [name] Out of Food in the Last Year: Never true  Transportation Needs: No  Transportation Needs (03/26/2024)   PRAPARE - Administrator, Civil Service (Medical): No    Lack of Transportation (Non-Medical): No  Physical Activity: Inactive (11/23/2023)   Exercise Vital Sign    Days of Exercise per Week: 0 days    Minutes of Exercise per Session: 0 min  Stress: No Stress Concern Present (11/23/2023)   [employer] - Occupational Stress Questionnaire    Feeling of Stress : Only a little  Social Connections: Moderately Isolated (03/26/2024)   Social Connection and Isolation Panel    Frequency of Communication with Friends and Family: Once a week    Frequency of Social Gatherings with Friends and Family: Never    Attends Religious Services: 1 to 4 times per year    Active Member of [employer] or Organizations: No    Attends Banker Meetings: Never    Marital Status: Married  Catering manager Violence: Not At Risk (03/26/2024)   Humiliation, Afraid, Rape, and Kick questionnaire    Fear of Current or Ex-Partner: No    Emotionally Abused: No    Physically Abused: No    Sexually Abused: No    Family History:   Family History  Problem Relation Age of Onset   Stroke Father 65   Sudden death Mother 14       unknown cause     ROS:  Please see the history of present illness.  All other ROS reviewed and negative.     Physical Exam/Data:   Vitals:   03/27/24 1936 03/28/24 0028 03/28/24 0440 03/28/24 0733  BP: 104/73 117/86 123/60 117/70  Pulse: 77 72 76 86  Resp: (!) 21 15 (!) 21 17  Temp: 97.6 F (36.4 C) 98 F (36.7 C) 97.7 F (36.5 C) 97.7 F (36.5 C)  TempSrc: Oral Oral Oral Oral  SpO2: 100% 100% 100% 100%  Weight:   58 kg   Height:        Intake/Output Summary (Last 24 hours) at 03/28/2024 1050 Last data filed at 03/28/2024 0534 Gross per 24 hour  Intake 368.36 ml  Output 2475 ml  Net -2106.64 ml      03/28/2024    4:40 AM 03/27/2024    4:23 AM 03/26/2024   12:10 PM  Last 3 Weights  Weight (  lbs)  127 lb 13.9 oz 128 lb 12.8 oz 128 lb 1.4 oz  Weight (kg) 58 kg 58.423 kg 58.1 kg     Body mass index is 21.95 kg/m.  General:  thin elderly white male HEENT: normal Neck: no JVD Cardiac:  normal S1, S2; RRR;  2/6 SEM heard best @ RUSB.  Lungs:  clear to auscultation bilaterally, no wheezing, rhonchi or rales  Abd: soft, nontender, no hepatomegaly  Ext: trace pretibial edema on right.  Musculoskeletal:  No deformities, BUE and BLE strength normal and equal Skin: warm and dry  Neuro:  CNs 2-12 intact, no focal abnormalities noted Psych:  Normal affect   EKG:  The EKG was personally reviewed and demonstrates:  sinus with LBBB, LVH with repol abnormalities,  HR 83  Telemetry:  Telemetry was personally reviewed and demonstrates:  sinus with PVCs and PACs, short run of NSVT.  Cardiac Studies & Procedures   ______________________________________________________________________________________________ CARDIAC CATHETERIZATION  CARDIAC CATHETERIZATION 07/27/2021  Conclusion   Prox RCA lesion is 100% stenosed.   1st Mrg lesion is 90% stenosed.   Mid Cx to Dist Cx lesion is 90% stenosed.   1st Diag lesion is 95% stenosed.   2nd Diag-1 lesion is 80% stenosed.   2nd Diag-2 lesion is 85% stenosed.   Mid LAD lesion is 85% stenosed.   There is mild left ventricular systolic dysfunction.   LV end diastolic pressure is normal.  1.  Moderately calcified coronary arteries with severe three-vessel coronary artery disease.  The culprit for myocardial infarction is likely occluded proximal right coronary artery.  The onset of occlusion occlusion is likely more than 12 hours with no chest pain. 2.  Mildly reduced LV systolic function with an EF of 40 to 45% with normal left ventricular end-diastolic pressure.  There is evidence of basal to mid inferior wall hypokinesis.  Recommendations: Given diffuse three-vessel coronary artery disease, I think the best option for revascularization is CABG. Resume  heparin  in 4 hours at 7 PM.  Multivessel PCI to the LAD, OM1 and left circumflex is also possible if the patient is deemed to be too high risk for CABG.  Findings Coronary Findings Diagnostic  Dominance: Right  Left Main The vessel exhibits minimal luminal irregularities.  Left Anterior Descending Mid LAD lesion is 85% stenosed.  First Diagonal Branch Vessel is large in size. 1st Diag lesion is 95% stenosed.  Second Diagonal Branch Vessel is large in size. 2nd Diag-1 lesion is 80% stenosed. 2nd Diag-2 lesion is 85% stenosed.  Left Circumflex Mid Cx to Dist Cx lesion is 90% stenosed.  First Obtuse Marginal Branch 1st Mrg lesion is 90% stenosed.  Second Obtuse Marginal Branch Vessel is small in size.  Right Coronary Artery Prox RCA lesion is 100% stenosed.  Right Posterior Descending Artery Collaterals RPDA filled by collaterals from Dist LAD.  Third Right Posterolateral Branch Collaterals 3rd RPL filled by collaterals from Dist Cx.  Intervention  No interventions have been documented.   STRESS TESTS  NM MYOCAR MULTI W/SPECT W 10/24/2005   ECHOCARDIOGRAM  ECHOCARDIOGRAM COMPLETE 03/26/2024  Narrative ECHOCARDIOGRAM REPORT    Patient Name:   Danny [name] Date of Exam: 03/26/2024 Medical Rec #:  [id number]      Height:       64.0 in Accession #:    [id number]     Weight:       128.1 lb Date of Birth:  1933-10-10     BSA:  1.619 m Patient Age:    54 years       BP:           116/64 mmHg Patient Gender: M              HR:           81 bpm. Exam Location:  Inpatient  Procedure: 2D Echo and Intracardiac Opacification Agent (Both Spectral and Color Flow Doppler were utilized during procedure).  Indications:    CHF  History:        Patient has prior history of Echocardiogram examinations. CHF, CAD, Prior CABG; Aortic Valve Disease and Mitral Valve Disease.  Sonographer:    Norleen Amour Referring Phys: MAXIMINO LABOR SMITH  IMPRESSIONS   1.  Left ventricular ejection fraction, by estimation, is <20%. The left ventricle has severely decreased function. The left ventricle demonstrates global hypokinesis. Left ventricular diastolic parameters are consistent with Grade II diastolic dysfunction (pseudonormalization). Elevated left ventricular end-diastolic pressure. 2. Right ventricular systolic function is moderately reduced. The right ventricular size is normal. There is moderately elevated pulmonary artery systolic pressure. 3. Left atrial size was severely dilated. 4. Right atrial size was moderately dilated. 5. Moderate pleural effusion in the left lateral region. 6. The mitral valve is degenerative. Severe mitral valve regurgitation. No evidence of mitral stenosis. 7. Tricuspid valve regurgitation is moderate to severe. 8. Low flow, low gradient severe aortic stenosis. Dimensionless index 0.22. Aortic regurgitation pressure half-time 351 ms. The aortic valve is calcified. There is severe calcifcation of the aortic valve. There is severe thickening of the aortic valve. Aortic valve regurgitation is moderate. Severe aortic valve stenosis. Aortic valve area, by VTI measures 0.77 cm. Aortic valve mean gradient measures 17.0 mmHg. Aortic valve Vmax measures 2.85 m/s. 9. Pulmonic valve regurgitation is moderate. 10. The inferior vena cava is normal in size with <50% respiratory variability, suggesting right atrial pressure of 8 mmHg.  FINDINGS Left Ventricle: Left ventricular ejection fraction, by estimation, is <20%. The left ventricle has severely decreased function. The left ventricle demonstrates global hypokinesis. The left ventricular internal cavity size was normal in size. There is no left ventricular hypertrophy. Left ventricular diastolic parameters are consistent with Grade II diastolic dysfunction (pseudonormalization). Elevated left ventricular end-diastolic pressure.  Right Ventricle: The right ventricular size is normal. No  increase in right ventricular wall thickness. Right ventricular systolic function is moderately reduced. There is moderately elevated pulmonary artery systolic pressure. The tricuspid regurgitant velocity is 3.05 m/s, and with an assumed right atrial pressure of 8 mmHg, the estimated right ventricular systolic pressure is 45.2 mmHg.  Left Atrium: Left atrial size was severely dilated.  Right Atrium: Right atrial size was moderately dilated.  Pericardium: There is no evidence of pericardial effusion.  Mitral Valve: The mitral valve is degenerative in appearance. Severe mitral valve regurgitation. No evidence of mitral valve stenosis.  Tricuspid Valve: The tricuspid valve is normal in structure. Tricuspid valve regurgitation is moderate to severe. No evidence of tricuspid stenosis.  Aortic Valve: Low flow, low gradient severe aortic stenosis. Dimensionless index 0.22. Aortic regurgitation pressure half-time 351 ms. The aortic valve is calcified. There is severe calcifcation of the aortic valve. There is severe thickening of the aortic valve. Aortic valve regurgitation is moderate. Severe aortic stenosis is present. Aortic valve mean gradient measures 17.0 mmHg. Aortic valve peak gradient measures 32.6 mmHg. Aortic valve area, by VTI measures 0.77 cm.  Pulmonic Valve: The pulmonic valve was normal in structure. Pulmonic valve regurgitation  is moderate. No evidence of pulmonic stenosis.  Aorta: The aortic root is normal in size and structure.  Venous: The inferior vena cava is normal in size with less than 50% respiratory variability, suggesting right atrial pressure of 8 mmHg.  IAS/Shunts: No atrial level shunt detected by color flow Doppler.  Additional Comments: There is a moderate pleural effusion in the left lateral region.   LEFT VENTRICLE PLAX 2D LVIDd:         4.70 cm      Diastology LVIDs:         4.30 cm      LV e' medial:    3.81 cm/s LV PW:         1.00 cm      LV E/e' medial:   20.7 LV IVS:        1.00 cm      LV e' lateral:   10.30 cm/s LVOT diam:     2.10 cm      LV E/e' lateral: 7.6 LV SV:         39 LV SV Index:   24 LVOT Area:     3.46 cm  LV Volumes (MOD) LV vol d, MOD A2C: 197.0 ml LV vol d, MOD A4C: 148.0 ml LV vol s, MOD A2C: 132.0 ml LV vol s, MOD A4C: 128.0 ml LV SV MOD A2C:     65.0 ml LV SV MOD A4C:     148.0 ml LV SV MOD BP:      40.1 ml  RIGHT VENTRICLE             IVC RV Basal diam:  4.00 cm     IVC diam: 1.50 cm RV S prime:     10.10 cm/s TAPSE (M-mode): 1.3 cm  LEFT ATRIUM             Index        RIGHT ATRIUM           Index LA diam:        3.50 cm 2.16 cm/m   RA Area:     19.70 cm LA Vol (A2C):   65.3 ml 40.34 ml/m  RA Volume:   62.30 ml  38.48 ml/m LA Vol (A4C):   63.6 ml 39.29 ml/m LA Biplane Vol: 64.7 ml 39.97 ml/m AORTIC VALVE AV Area (Vmax):    0.75 cm AV Area (Vmean):   0.71 cm AV Area (VTI):     0.77 cm AV Vmax:           285.40 cm/s AV Vmean:          190.000 cm/s AV VTI:            0.503 m AV Peak Grad:      32.6 mmHg AV Mean Grad:      17.0 mmHg LVOT Vmax:         61.80 cm/s LVOT Vmean:        38.967 cm/s LVOT VTI:          0.111 m LVOT/AV VTI ratio: 0.22  AORTA Ao Root diam: 2.80 cm Ao Asc diam:  2.40 cm  MITRAL VALVE               TRICUSPID VALVE MV Area (PHT): 5.16 cm    TR Peak grad:   37.2 mmHg MV Decel Time: 147 msec    TR Vmax:        305.00 cm/s MV E velocity: 78.70 cm/s  MV A velocity: 58.10 cm/s  SHUNTS MV E/A ratio:  1.35        Systemic VTI:  0.11 m Systemic Diam: 2.10 cm  Annabella Scarce MD Electronically signed by Annabella Scarce MD Signature Date/Time: 03/26/2024/7:32:52 PM    Final   TEE  ECHO INTRAOPERATIVE TEE 08/01/2021  Narrative *INTRAOPERATIVE TRANSESOPHAGEAL REPORT *    Patient Name:   Danny [name] Date of Exam: 08/01/2021 Medical Rec #:  [id number]      Height:       64.0 in Accession #:    [id number]     Weight:       155.9 lb Date of Birth:  03-25-1934      BSA:          1.76 m Patient Age:    87 years       BP:           130/89 mmHg Patient Gender: M              HR:           85 bpm. Exam Location:  Inpatient  Transesophogeal exam was perform intraoperatively during surgical procedure. Patient was closely monitored under general anesthesia during the entirety of examination.  Indications:     coronary artery bypass surgery Performing Phys: [id number] DORISE MARLA FELLERS Diagnosing Phys: Allena Seip MD  POST-OP IMPRESSIONS _ Left Ventricle: Post Bypass: The patient came off bypass with moderate right ventricle systolic dysfunction with dilated right ventricle with inotrophe support. The TEE that was placed after induction without difficulty was removed at the end of the case uneventfully. The patient was later taken to the SICU in stable condition. _ Right Ventricle: moderately reduced function. Moderate right ventricular systolic dysfunction that improved with inotropes.  PRE-OP FINDINGS Left Ventricle: The left ventricle has normal systolic function, with an ejection fraction of 60-65%. The cavity size was left ventricular size was not assessed.   Mitral Valve: The mitral valve is normal in structure.  Tricuspid Valve: The tricuspid valve was normal in structure.  Aortic Valve: The aortic valve is tricuspid Aortic valve regurgitation is mild by color flow Doppler. There is moderate calcification present.  +--------------+--------++ LEFT VENTRICLE         +--------------+--------++ PLAX 2D                +--------------+--------++ LVOT diam:    2.00 cm  +--------------+--------++ LVOT Area:    3.14 cm +--------------+--------++                        +--------------+--------++  +-------------+------------++ AORTIC VALVE              +-------------+------------++ AV Vmax:     203.00 cm/s  +-------------+------------++ AV Vmean:    133.000 cm/s +-------------+------------++ AV VTI:       0.381 m      +-------------+------------++ AV Peak Grad:16.5 mmHg    +-------------+------------++ AV Mean Grad:8.0 mmHg     +-------------+------------++ AR PHT:      293 msec     +-------------+------------++   +--------------+-------+ SHUNTS                +--------------+-------+ Systemic Diam:2.00 cm +--------------+-------+   Allena Seip MD Electronically signed by Allena Seip MD Signature Date/Time: 08/04/2021/8:34:08 PM    Final  MONITORS  LONG TERM MONITOR (3-14 DAYS) 12/07/2023  Narrative   Predominant rhythm is sinus rhythm   40 episodes of supraventricular tachycardia with  the fastest interval lasting 5 beats with a max HR of 176 and the longest episode lasted 12 beats with an avg. HR of 113.   1 pause lasting 3 seconds   Frequent PACs ( 7.1% PAC burden)   Occasional PVCs ( 2.1% PVC burden)   Patch Wear Time:  13 days and 23 hours (2025-04-29T15:58:01-399 to 2025-05-13T15:57:59-0400)  Patient had a min HR of 42 bpm, max HR of 176 bpm, and avg HR of 72 bpm. Predominant underlying rhythm was Sinus Rhythm. Slight P wave morphology changes were noted. Intermittent Bundle Branch Block was present. 40 Supraventricular Tachycardia runs occurred, the run with the fastest interval lasting 5 beats with a max rate of 176 bpm, the longest lasting 12 beats with an avg rate of 113 bpm. 1 Pause occurred lasting 3 secs (20 bpm). Some Pauses occurred due to Second Degree AV Block-Mobitz I (Wenckebach). Isolated SVEs were frequent (7.1%, [phone]), SVE Couplets were rare (<1.0%, 7156), and SVE Triplets were rare (<1.0%, 1080). Isolated VEs were occasional (2.1%, [phone]), VE Couplets were rare (<1.0%, 1966), and no VE Triplets were present.       ______________________________________________________________________________________________      Laboratory Data:  High Sensitivity Troponin:   Recent Labs  Lab 03/26/24 0404  TROPONINIHS 484*      Chemistry Recent Labs  Lab 03/26/24 0404 03/27/24 0430 03/28/24 0500  NA 132* 134* 135  K 5.1 4.6 4.1  CL 104 98 98  CO2 18* 21* 21*  GLUCOSE 108* 138* 111*  BUN 74* 88* 87*  CREATININE 2.37* 2.68* 2.35*  CALCIUM  8.5* 8.4* 8.3*  GFRNONAA 26* 22* 26*  ANIONGAP 10 15 16*    Recent Labs  Lab 03/25/24 1920 03/26/24 0404 03/27/24 0430  PROT 7.0 6.4* 6.0*  ALBUMIN  3.7 3.0* 2.9*  AST 205* 216* 200*  ALT 207* 226* 258*  ALKPHOS 141* 121 159*  BILITOT 1.2 2.1* 1.8*   Hematology Recent Labs  Lab 03/26/24 0404 03/27/24 0430 03/28/24 0500  WBC 5.1 5.8 4.9  RBC 2.35* 2.16* 2.10*  HGB 8.2* 7.6* 7.2*  HCT 24.9* 22.5* 21.9*  MCV 106.0* 104.2* 104.3*  MCH 34.9* 35.2* 34.3*  MCHC 32.9 33.8 32.9  RDW 20.6* 21.2* 20.2*  PLT 97* 82* 76*   BNP Recent Labs  Lab 03/26/24 0404  BNP >4,500.0*    DDimer No results for input(s): DDIMER in the last 168 hours.   Radiology/Studies:  ECHOCARDIOGRAM COMPLETE Result Date: 03/26/2024    ECHOCARDIOGRAM REPORT   Patient Name:   Danny [name] Date of Exam: 03/26/2024 Medical Rec #:  [id number]      Height:       64.0 in Accession #:    [id number]     Weight:       128.1 lb Date of Birth:  July 15, 1933     BSA:          1.619 m Patient Age:    89 years       BP:           116/64 mmHg Patient Gender: M              HR:           81 bpm. Exam Location:  Inpatient Procedure: 2D Echo and Intracardiac Opacification Agent (Both Spectral and Color            Flow Doppler were utilized during procedure). Indications:    CHF  History:        Patient  has prior history of Echocardiogram examinations. CHF,                 CAD, Prior CABG; Aortic Valve Disease and Mitral Valve Disease.  Sonographer:    Norleen Amour Referring Phys: MAXIMINO LABOR SMITH IMPRESSIONS  1. Left ventricular ejection fraction, by estimation, is <20%. The left ventricle has severely decreased function. The left ventricle demonstrates global hypokinesis. Left ventricular diastolic  parameters are consistent with Grade II diastolic dysfunction (pseudonormalization). Elevated left ventricular end-diastolic pressure.  2. Right ventricular systolic function is moderately reduced. The right ventricular size is normal. There is moderately elevated pulmonary artery systolic pressure.  3. Left atrial size was severely dilated.  4. Right atrial size was moderately dilated.  5. Moderate pleural effusion in the left lateral region.  6. The mitral valve is degenerative. Severe mitral valve regurgitation. No evidence of mitral stenosis.  7. Tricuspid valve regurgitation is moderate to severe.  8. Low flow, low gradient severe aortic stenosis. Dimensionless index 0.22. Aortic regurgitation pressure half-time 351 ms. The aortic valve is calcified. There is severe calcifcation of the aortic valve. There is severe thickening of the aortic valve. Aortic valve regurgitation is moderate. Severe aortic valve stenosis. Aortic valve area, by VTI measures 0.77 cm. Aortic valve mean gradient measures 17.0 mmHg. Aortic valve Vmax measures 2.85 m/s.  9. Pulmonic valve regurgitation is moderate. 10. The inferior vena cava is normal in size with <50% respiratory variability, suggesting right atrial pressure of 8 mmHg. FINDINGS  Left Ventricle: Left ventricular ejection fraction, by estimation, is <20%. The left ventricle has severely decreased function. The left ventricle demonstrates global hypokinesis. The left ventricular internal cavity size was normal in size. There is no  left ventricular hypertrophy. Left ventricular diastolic parameters are consistent with Grade II diastolic dysfunction (pseudonormalization). Elevated left ventricular end-diastolic pressure. Right Ventricle: The right ventricular size is normal. No increase in right ventricular wall thickness. Right ventricular systolic function is moderately reduced. There is moderately elevated pulmonary artery systolic pressure. The tricuspid regurgitant  velocity is 3.05 m/s, and with an assumed right atrial pressure of 8 mmHg, the estimated right ventricular systolic pressure is 45.2 mmHg. Left Atrium: Left atrial size was severely dilated. Right Atrium: Right atrial size was moderately dilated. Pericardium: There is no evidence of pericardial effusion. Mitral Valve: The mitral valve is degenerative in appearance. Severe mitral valve regurgitation. No evidence of mitral valve stenosis. Tricuspid Valve: The tricuspid valve is normal in structure. Tricuspid valve regurgitation is moderate to severe. No evidence of tricuspid stenosis. Aortic Valve: Low flow, low gradient severe aortic stenosis. Dimensionless index 0.22. Aortic regurgitation pressure half-time 351 ms. The aortic valve is calcified. There is severe calcifcation of the aortic valve. There is severe thickening of the aortic valve. Aortic valve regurgitation is moderate. Severe aortic stenosis is present. Aortic valve mean gradient measures 17.0 mmHg. Aortic valve peak gradient measures 32.6 mmHg. Aortic valve area, by VTI measures 0.77 cm. Pulmonic Valve: The pulmonic valve was normal in structure. Pulmonic valve regurgitation is moderate. No evidence of pulmonic stenosis. Aorta: The aortic root is normal in size and structure. Venous: The inferior vena cava is normal in size with less than 50% respiratory variability, suggesting right atrial pressure of 8 mmHg. IAS/Shunts: No atrial level shunt detected by color flow Doppler. Additional Comments: There is a moderate pleural effusion in the left lateral region.  LEFT VENTRICLE PLAX 2D LVIDd:         4.70 cm  Diastology LVIDs:         4.30 cm      LV e' medial:    3.81 cm/s LV PW:         1.00 cm      LV E/e' medial:  20.7 LV IVS:        1.00 cm      LV e' lateral:   10.30 cm/s LVOT diam:     2.10 cm      LV E/e' lateral: 7.6 LV SV:         39 LV SV Index:   24 LVOT Area:     3.46 cm  LV Volumes (MOD) LV vol d, MOD A2C: 197.0 ml LV vol d, MOD A4C:  148.0 ml LV vol s, MOD A2C: 132.0 ml LV vol s, MOD A4C: 128.0 ml LV SV MOD A2C:     65.0 ml LV SV MOD A4C:     148.0 ml LV SV MOD BP:      40.1 ml RIGHT VENTRICLE             IVC RV Basal diam:  4.00 cm     IVC diam: 1.50 cm RV S prime:     10.10 cm/s TAPSE (M-mode): 1.3 cm LEFT ATRIUM             Index        RIGHT ATRIUM           Index LA diam:        3.50 cm 2.16 cm/m   RA Area:     19.70 cm LA Vol (A2C):   65.3 ml 40.34 ml/m  RA Volume:   62.30 ml  38.48 ml/m LA Vol (A4C):   63.6 ml 39.29 ml/m LA Biplane Vol: 64.7 ml 39.97 ml/m  AORTIC VALVE AV Area (Vmax):    0.75 cm AV Area (Vmean):   0.71 cm AV Area (VTI):     0.77 cm AV Vmax:           285.40 cm/s AV Vmean:          190.000 cm/s AV VTI:            0.503 m AV Peak Grad:      32.6 mmHg AV Mean Grad:      17.0 mmHg LVOT Vmax:         61.80 cm/s LVOT Vmean:        38.967 cm/s LVOT VTI:          0.111 m LVOT/AV VTI ratio: 0.22  AORTA Ao Root diam: 2.80 cm Ao Asc diam:  2.40 cm MITRAL VALVE               TRICUSPID VALVE MV Area (PHT): 5.16 cm    TR Peak grad:   37.2 mmHg MV Decel Time: 147 msec    TR Vmax:        305.00 cm/s MV E velocity: 78.70 cm/s MV A velocity: 58.10 cm/s  SHUNTS MV E/A ratio:  1.35        Systemic VTI:  0.11 m                            Systemic Diam: 2.10 cm Annabella Scarce MD Electronically signed by Annabella Scarce MD Signature Date/Time: 03/26/2024/7:32:52 PM    Final    US  EKG SITE RITE Result Date: 03/26/2024 If Site Rite image not attached, placement could  not be confirmed due to current cardiac rhythm.  US  Abdomen Complete Result Date: 03/26/2024 EXAM: COMPLETE ABDOMINAL ULTRASOUND TECHNIQUE: Real-time ultrasonography of the abdomen was performed. COMPARISON: CT of the abdomen and pelvis dated 11/28/2000 and abdominal sonogram dated 10/27/1999. CLINICAL HISTORY: Acute kidney injury (HCC), Transaminitis, Serum lipase elevation. FINDINGS: LIVER: The liver is diffusely heterogeneously echogenic. BILIARY SYSTEM: There is  mild biliary sludge. There is thickening of the gallbladder wall, which measures 4.5 mm in thickness. There is a cholesterol polyp demonstrated. The patient is not focally tender over the gallbladder. The common bile duct measures 4 mm in diameter. KIDNEYS: The right kidney measures 8.8 cm in length and contains a simple cyst measuring approximately 1.5 cm. The left kidney measures 10.6 cm in length and contains a complex cyst measuring approximately 4.9 x 3.1 x 4.6 cm. There is a calcified rim, as noted on the previous CT. PANCREAS: The pancreatic duct measures 2 mm in diameter, within normal limits. SPLEEN: No acute abnormality. Spleen is within normal limits in size. VESSELS: The abdominal aorta is normal in caliber measuring 2 cm in diameter, but demonstrates moderate diffuse calcific atheromatous disease. The inferior vena cava is normal in caliber. OTHER: There are bilateral pleural effusions present. IMPRESSION: 1. Mild biliary sludge and gallbladder wall thickening (4.5 mm) with cholesterol polyp. No focal tenderness over the gallbladder. 2. Bilateral pleural effusions. Electronically signed by: Evalene Coho MD 03/26/2024 05:48 AM EDT RP Workstation: [id number]   DG Chest Portable 1 View Result Date: 03/25/2024 CLINICAL DATA:  SOB EXAM: PORTABLE CHEST 1 VIEW COMPARISON:  Chest x-ray 09/07/2021 FINDINGS: The heart and mediastinal contours are unchanged. Atherosclerotic plaque. Surgical changes noted overlying the mediastinum. Left costophrenic angle collimated off view. No focal consolidation. Mild pulmonary edema. No pleural effusion. No pneumothorax. No acute osseous abnormality.  Intact sternotomy wires. IMPRESSION: 1. Mild pulmonary edema. 2.  Aortic Atherosclerosis (ICD10-I70.0). 3. Left costophrenic angle collimated off view. Electronically Signed   By: Morgane  Naveau M.D.   On: 03/25/2024 19:29   ______________________   ________________________________    Procedure Type: Isolated  AVR Perioperative Outcome Estimate % Operative Mortality 48.6% Morbidity & Mortality 74% Stroke 12.1% Renal Failure 37.4% Reoperation 17% Prolonged Ventilation 69.5% Deep Sternal Wound Infection 0.168% Long Hospital Stay (>14 days) 54.9% [facility] Stay (<6 days)* 2.31%   ______________________________   [place] Cardiomyopathy Questionnaire     03/28/2024   11:34 AM  KCCQ-12  1 a. Ability to shower/bathe Extremely limited  1 b. Ability to walk 1 block Extremely limited  1 c. Ability to hurry/jog Other, Did not do  2. Edema feet/ankles/legs Every morning  3. Limited by fatigue All of the time  4. Limited by dyspnea All of the time  5. Sitting up / on 3+ pillows Every night  6. Limited enjoyment of life Extremely limited  7. Rest of life w/ symptoms Not at all satisfied  8 a. Participation in hobbies Severely limited  8 b. Participation in chores Severely limited  8 c. Visiting family/friends Severely limited      Assessment and Plan:   Danny [name] is a 89 y.o. male with symptoms of severe, stage D2 LFLG aortic stenosis with NYHA Class IV symptoms currently admitted for biventricular cardiogenic shock requiring milrinone  support with AKI and shock liver as well as anemia. He diuresed well on IV Lasix . Co-ox now stable and off milrinone . He has been seen by palliative care and remains full code and strongly desires anything that will  improve his QOL and get him home to his wife and daughter.    TTE 03/26/24 w/ LVEF <20%, G2DD, moderate RV dysfunction, severe MR, mod-severe TR, moderate PR, moderate pleural effusion in lateral region, and severe LFLG aortic stenosis with mean gradient 17 mm hg, Vmax 2.85 m/s, AVA 0.77cm2, and moderate AI.   I have reviewed the natural history of aortic stenosis with the patient. We have discussed the limitations of medical therapy and the poor prognosis associated with symptomatic aortic stenosis. We have reviewed potential treatment  options, including palliative medical therapy, conventional surgical aortic valve replacement, and transcatheter aortic valve replacement. We discussed treatment options in the context of this patient's specific comorbid medical conditions.    The patient's predicted risk of mortality with conventional aortic valve replacement is 48.6% primarily based on age, low BMI, previous sternotomy, with CABG, cardiogenic shock, acute on chronic kidney disease, anemia. Other significant comorbid conditions include RV dysfunction. Currently the pt is not a candidate for TAVR, but if his renal function and RV dysfunction improves, he may be a candidate for work up. Plan to watch over the weekend and RHC versus repeat echo on Monday. If RV okay and renal function acceptable, we would proceed with TAVR CTs.   We will follow along with you.    For questions or updates, please contact [facility] Please consult [email or web address] for contact info under    Signed, Lamarr Hummer, PA-C  03/28/2024 10:50 AM  ATTENDING ATTESTATION:  After conducting a review of all available clinical information with the care team, interviewing the patient, and performing a physical exam, I agree with the findings and plan described in this note.   GEN: No acute distress, AO x 3, frail HEENT: Dry mucous membranes, JVP 6 cm Cardiac: RRR, 3 out of 6 systolic ejection murmur.  Respiratory: Clear to auscultation bilaterally. GI: Soft, nontender, non-distended  MS: No edema; No deformity. Neuro:  Nonfocal  Vasc:  +2 radial pulses  The patient is a very pleasant 89 year old gentleman with history coronary artery disease with a history of multivessel CABG in 2023 due to an acute coronary syndrome.  He underwent CABG consisting of a RIMA to LAD, LIMA to the obtuse marginal 1, and vein graft to obtuse marginal 2.  At that time his echocardiogram demonstrated normal ejection fraction with no significant aortic stenosis.  He has a  history of CKD stage III AA, left bundle branch block, bladder cancer, and peripheral arterial disease.  He has an ischemic cardiomyopathy.  His ejection fraction prior to CABG was 40 to 45% but had improved to 55 to 60% thereafter.  Apparently he has not been seen by cardiologist for a few years.  He cares for his wife at home who is suffering from dementia.  He was previously very independent.  He suffered a very rapid course developing weakness and fatigue.  He had significant shortness of breath as well.  He presented on September 16 with acute kidney injury and elevated lactate, and elevated BNP.  He was managed by advanced heart failure division.  Echocardiogram done this admission demonstrated an ejection fraction of less than 20%, moderate RV dysfunction, severe MR, moderate to severe TR and low-flow low gradient aortic stenosis.  This was also associated with moderate aortic insufficiency.  His last co-ox was 48.2.  His creatinine is improving and is down to 2.35.  I reviewed the patient's echocardiographic images performed a few days ago which demonstrates severe, severe LV dysfunction, low-flow  low gradient aortic stenosis, with associated moderate aortic insufficiency.  The patient also has moderate to severe mitral regurgitation likely due to a tethered posterior leaflet.  The RV is dilated with moderately reduced function.  There is also significant tricuspid regurgitation.  I believe the patient likely developed severe aortic valvular disease leading to LV dysfunction.  I will have a better sense of the patient's chance of improvement in possibility of benefiting from a TAVR once an echocardiogram is repeated.  .  I would expect at least some of his valvular disease to improved after his brisk diuresis.  RHC to understand his pulmonary pressures would also be helpful and I would expect that he likely has a low PVR.  Even if he has a high PVR and unchanged RV dysfunction he would likely benefit  from an isolated TAVRIf his creatinine is improved we may be able to consider a TAVR protocol CT as well.  Given the fact the patient's ejection fraction was normal a few years ago I think he would likely benefit from TAVR initially and consideration for other valvular interventions as needed in the future.   Lurena Red, MD Pager [phone]

## 2024-03-28 NOTE — Progress Notes (Signed)
 This chaplain responded to PMT NP-Eric consult for spiritual care. The chaplain is present with the Pt. after a successful mobility assistance experience. Family is not at the bedside, but his daughter is expected to visit today.  The Pt. is preparing to eat his lunch as the chaplain begins rapport building. The chaplain understands family is always the center of his prayers. The Pt. quickly shares the number of years he has been married to his wife.   A blessing of the food accompanied the chaplain's prayers with the Pt.  This chaplain is available for F/U spiritual care as needed.  Chaplain Leeroy Hummer [phone]

## 2024-03-29 DIAGNOSIS — N1831 Chronic kidney disease, stage 3a: Secondary | ICD-10-CM | POA: Diagnosis not present

## 2024-03-29 DIAGNOSIS — I25810 Atherosclerosis of coronary artery bypass graft(s) without angina pectoris: Secondary | ICD-10-CM | POA: Diagnosis not present

## 2024-03-29 DIAGNOSIS — I340 Nonrheumatic mitral (valve) insufficiency: Secondary | ICD-10-CM | POA: Diagnosis not present

## 2024-03-29 DIAGNOSIS — I447 Left bundle-branch block, unspecified: Secondary | ICD-10-CM | POA: Diagnosis not present

## 2024-03-29 DIAGNOSIS — I10 Essential (primary) hypertension: Secondary | ICD-10-CM | POA: Diagnosis not present

## 2024-03-29 DIAGNOSIS — I5023 Acute on chronic systolic (congestive) heart failure: Secondary | ICD-10-CM | POA: Diagnosis not present

## 2024-03-29 DIAGNOSIS — I2585 Chronic coronary microvascular dysfunction: Secondary | ICD-10-CM | POA: Diagnosis not present

## 2024-03-29 LAB — COMPREHENSIVE METABOLIC PANEL WITH GFR
ALT: 158 U/L — ABNORMAL HIGH (ref 0–44)
AST: 76 U/L — ABNORMAL HIGH (ref 15–41)
Albumin: 2.8 g/dL — ABNORMAL LOW (ref 3.5–5.0)
Alkaline Phosphatase: 115 U/L (ref 38–126)
Anion gap: 11 (ref 5–15)
BUN: 71 mg/dL — ABNORMAL HIGH (ref 8–23)
CO2: 23 mmol/L (ref 22–32)
Calcium: 8.2 mg/dL — ABNORMAL LOW (ref 8.9–10.3)
Chloride: 98 mmol/L (ref 98–111)
Creatinine, Ser: 1.86 mg/dL — ABNORMAL HIGH (ref 0.61–1.24)
GFR, Estimated: 35 mL/min — ABNORMAL LOW (ref >60)
Glucose, Bld: 109 mg/dL — ABNORMAL HIGH (ref 70–99)
Potassium: 4.1 mmol/L (ref 3.5–5.1)
Sodium: 132 mmol/L — ABNORMAL LOW (ref 135–145)
Total Bilirubin: 1 mg/dL (ref 0.0–1.2)
Total Protein: 6 g/dL — ABNORMAL LOW (ref 6.5–8.1)

## 2024-03-29 LAB — COOXEMETRY PANEL
Carboxyhemoglobin: 2.1 % — ABNORMAL HIGH (ref 0.5–1.5)
Methemoglobin: <0.7 % (ref 0.0–1.5)
O2 Saturation: 46.7 %
Total hemoglobin: 8.5 g/dL — ABNORMAL LOW (ref 12.0–16.0)

## 2024-03-29 LAB — CBC
HCT: 22.8 % — ABNORMAL LOW (ref 39.0–52.0)
Hemoglobin: 7.7 g/dL — ABNORMAL LOW (ref 13.0–17.0)
MCH: 35.2 pg — ABNORMAL HIGH (ref 26.0–34.0)
MCHC: 33.8 g/dL (ref 30.0–36.0)
MCV: 104.1 fL — ABNORMAL HIGH (ref 80.0–100.0)
Platelets: 71 K/uL — ABNORMAL LOW (ref 150–400)
RBC: 2.19 MIL/uL — ABNORMAL LOW (ref 4.22–5.81)
RDW: 19.8 % — ABNORMAL HIGH (ref 11.5–15.5)
WBC: 4.8 K/uL (ref 4.0–10.5)
nRBC: 1.3 % — ABNORMAL HIGH (ref 0.0–0.2)

## 2024-03-29 MED ORDER — PNEUMOCOCCAL 20-VAL CONJ VACC 0.5 ML IM SUSY
0.5000 mL | PREFILLED_SYRINGE | INTRAMUSCULAR | Status: AC
Start: 2024-03-30 — End: 2024-03-30
  Administered 2024-03-30: 0.5 mL via INTRAMUSCULAR
  Filled 2024-03-29: qty 0.5

## 2024-03-29 MED ORDER — INFLUENZA VAC SPLIT HIGH-DOSE 0.5 ML IM SUSY
0.5000 mL | PREFILLED_SYRINGE | INTRAMUSCULAR | Status: DC
  Filled 2024-03-29 (×2): qty 0.5

## 2024-03-29 NOTE — Progress Notes (Signed)
 Progress Note   Patient: Danny [name] FMW:[id number] DOB: 07/25/1933 DOA: 03/25/2024     3 DOS: the patient was seen and examined on 03/29/2024   Brief hospital course: [name] was admitted to the hospital with the working diagnosis of heart failure exacerbation.   89 yo male with the past medical history of heart failure, aortic stenosis, coronary artery disease, CKD, and bladder cancer sp TURP who presented with generalized weakness and fatigue.  On his initial physical examination his blood pressure 117/79, HR 78, RR 19 and 02 saturation 90%  Lungs with no wheezing or rhonchi, heart with S1 and S2 present and regular, positive systolic murmur at the base, abdomen soft and not distended and positive lower extremity edema.   Echocardiogram with reduced LV systolic function, with signs of hypoperfusion.  Placed on milrinone  for inotropic support.   09/19 off milrinone . Consulted structural heart team.  09/20 volume status has improved, plan for TEE and right heart catheterization during this admission.   Assessment and Plan: Acute on chronic systolic CHF (congestive heart failure) (HCC) Echocardiogram with reduced LV systolic function < 20%, global hypokinesis, RV with moderate reduction in systolic function, LA with severe dilatation, RA with moderate dilatation, severe mitral valve regurgitation, moderate to severe tricuspid valve regurgitation, low flow severe aortic valve stenosis with moderate regurgitation, moderate pulmonary valve regurgitation,   Urine output is 1,250 ml Systolic blood pressure 100 mmHg.  Sv02 46.7    09/19 off milrinone   Holding on diuretic therapy for now.  Poor prognosis due to valvular heart disease.  Consulted structural heart team, plan for TEE and possible right heart catheterization.   Congested hepatopathy die to volume overload.  AST and ALT are trending down.  Continue supportive medical care. No hepatic encephalopathy.   Essential  hypertension Continue blood pressure monitoring   Chronic kidney disease, stage 3a (HCC) AKI, hyponatremia,   Today renal function with serum cr trending down to 1,86 with K at 4.1 and serum bicarbonate at 23 Na 132   Continue close monitoring renal function and electrolytes.  Holding diuretic for now.   Coronary artery disease Hold on aspirin  due to anemia., Continue with statin.  Elevation in high sensitive troponin due to heart failure, no acute coronary syndrome.   PAD (peripheral artery disease) (HCC) Continue supportive medical care  Hyperlipidemia Continue statin therapy  Macrocytic anemia Thrombocytopenia.  Follow up hgb is 7,7 with plt 71 and wbc 4.8  Iron panel with serum iron at 146, TIBC 325, transferrin saturation 45 and ferritin 582   Possible anemia related to aortic stenosis.  Low platelet may suggest bone marrow failure.  Follow up cell count in 48 hrs  Hold PRBC transfusion for now.   History of bladder cancer Follow up as outpatient.      Subjective: Patient no chest pain or dyspnea, edema has improved, he has been out of bed.   Physical Exam: Vitals:   03/29/24 0430 03/29/24 0535 03/29/24 0749 03/29/24 1152  BP: 97/64  111/69 109/66  Pulse: 81  82 61  Resp: 16  17 14   Temp: 97.9 F (36.6 C)  98.1 F (36.7 C) 98 F (36.7 C)  TempSrc: Oral  Oral Oral  SpO2: 98%  100% 100%  Weight:  57.7 kg    Height:       Neurology awake and alert ENT with mild pallor Cardiovascular with S1 and S2 present and regular with no gallops or rubs, positive systolic murmur at the base  Mild JVD Respiratory with mild rales at bases with no wheezing Abdomen with no distention  No lower extremity edema   Data Reviewed:    Family Communication: no family at the bedside   Disposition: Status is: Inpatient Remains inpatient appropriate because: recovering heart failure.   Planned Discharge Destination: Home    Author: Elidia Toribio Furnace,  MD 03/29/2024 1:48 PM  For on call review [email or web address].

## 2024-03-29 NOTE — Progress Notes (Signed)
 Patient had 7 beats of NSVT. Asymptomatic. Dr. Charlton informed.

## 2024-03-29 NOTE — Progress Notes (Signed)
 Mobility Specialist Progress Note:    03/29/24 1200  Mobility  Activity Ambulated with assistance  Level of Assistance Contact guard assist, steadying assist  Assistive Device Front wheel walker  Distance Ambulated (ft) 50 ft (x2)  Activity Response Tolerated well  Mobility Referral Yes  Mobility visit 1 Mobility  Mobility Specialist Start Time (ACUTE ONLY) 0940  Mobility Specialist Stop Time (ACUTE ONLY) [id number]  Mobility Specialist Time Calculation (min) (ACUTE ONLY) 12 min   Pt received in bed agreeable to mobility. No c/o throughout. No physical assistance needed, contact guard for safety. Unable to get a good pleth reading on finger placed pulse ox on ear to get a better reading. SPO2 was 90%-100% on RA throughout session. Returned to room w/o fault. Left in bed w/ call bell and personal belongings in reach. All needs met.  Thersia Minder Mobility Specialist  Please contact vis Secure Chat or  Rehab Office [phone]

## 2024-03-29 NOTE — Progress Notes (Signed)
 Progress Note  Patient Name: Danny [name] Date of Encounter: 03/29/2024 [facility] Cardiologist: None (Former Mudlogger)  Interval Summary   Feeling better after diuretics.  Able to lie fully horizontally in bed without dyspnea. Renal function parameters continue to rapidly improve.  Liver function tests also improving. Net diuresis just over liter last 24 hours, almost 3 L since admission.  Vital Signs Vitals:   03/29/24 0018 03/29/24 0430 03/29/24 0535 03/29/24 0749  BP: 103/65 97/64  111/69  Pulse: 80 81  82  Resp: 14 16  17   Temp: 98.4 F (36.9 C) 97.9 F (36.6 C)  98.1 F (36.7 C)  TempSrc: Oral Oral  Oral  SpO2:  98%  100%  Weight:   57.7 kg   Height:        Intake/Output Summary (Last 24 hours) at 03/29/2024 1103 Last data filed at 03/29/2024 0535 Gross per 24 hour  Intake 80 ml  Output 1250 ml  Net -1170 ml      03/29/2024    5:35 AM 03/28/2024    4:40 AM 03/27/2024    4:23 AM  Last 3 Weights  Weight (lbs) 127 lb 3.3 oz 127 lb 13.9 oz 128 lb 12.8 oz  Weight (kg) 57.7 kg 58 kg 58.423 kg      Telemetry/ECG  Sinus rhythm with occasional PACs and PVCs- Personally Reviewed  Physical Exam  GEN: No acute distress.   Neck: No JVD Cardiac: RRR, early to mid peaking aortic ejection murmur heard at the right upper sternal border radiating to the carotids, holosystolic murmur heard at the apex is less impressive, no diastolic murmurs, rubs, or gallops.  Respiratory: Clear to auscultation bilaterally. GI: Soft, nontender, non-distended  MS: No edema  Assessment & Plan  89 year old man with CAD s/p CABG after presenting with non-STEMI 2023 (RIMA-LAD, LIMA-OM1, SVG-OM 2), CKD stage IIIa, LBBB, PAD, presents with cardiogenic shock and acute systolic and diastolic heart failure with a marked reduction in LV ejection fraction from 55 to 60% to 20% in the setting of severe aortic stenosis/moderate aortic insufficiency and moderate-severe mitral  insufficiency.  Currently being evaluated for possible TAVR.  He has improved with medical therapy.  Plan for TEE and/or right heart catheterization.  Suspect that the mitral insufficiency is secondary and that the aortic stenosis is the driver for his cardiomyopathy.  Suspect that the LBBB is also a big part of the cardiomyopathy and possibly also the mitral insufficiency.  The QRS complex was narrow in January 2023 when he presented with his acute coronary syndrome.  Minor IVCD is seen on the ECG from May 2024, with QRS again narrow in February and April 2025.  Current ECG shows QRS duration over 150 ms, LBBB pattern.  It is likely he will also benefit from CRT pacing.  Informed Consent   Shared Decision Making/Informed Consent The risks [stroke (1 in 1000), death (1 in 1000), kidney failure [usually temporary] (1 in 500), bleeding (1 in 200), allergic reaction [possibly serious] (1 in 200)], benefits (diagnostic support and management of coronary artery disease) and alternatives of a cardiac catheterization were discussed in detail with Danny [name] and he is willing to proceed.     The risks [esophageal damage, perforation (1:10,000 risk), bleeding, pharyngeal hematoma as well as other potential complications associated with conscious sedation including aspiration, arrhythmia, respiratory failure and death], benefits (treatment guidance and diagnostic support) and alternatives of a transesophageal echocardiogram were discussed in detail with Danny [name] and he is willing  to proceed.        For questions or updates, please contact [facility] Please consult [email or web address] for contact info under         Signed, Jerel Balding, MD

## 2024-03-29 NOTE — Plan of Care (Signed)

## 2024-03-29 NOTE — Plan of Care (Signed)
   Problem: Education: Goal: Knowledge of General Education information will improve Description: Including pain rating scale, medication(s)/side effects and non-pharmacologic comfort measures Outcome: Progressing   Problem: Clinical Measurements: Goal: Will remain free from infection Outcome: Progressing   Problem: Clinical Measurements: Goal: Diagnostic test results will improve Outcome: Progressing   Problem: Clinical Measurements: Goal: Respiratory complications will improve Outcome: Progressing   Problem: Clinical Measurements: Goal: Cardiovascular complication will be avoided Outcome: Progressing   Problem: Activity: Goal: Risk for activity intolerance will decrease Outcome: Progressing

## 2024-03-30 DIAGNOSIS — I2585 Chronic coronary microvascular dysfunction: Secondary | ICD-10-CM | POA: Diagnosis not present

## 2024-03-30 DIAGNOSIS — I5023 Acute on chronic systolic (congestive) heart failure: Secondary | ICD-10-CM | POA: Diagnosis not present

## 2024-03-30 DIAGNOSIS — I340 Nonrheumatic mitral (valve) insufficiency: Secondary | ICD-10-CM | POA: Diagnosis not present

## 2024-03-30 DIAGNOSIS — N1831 Chronic kidney disease, stage 3a: Secondary | ICD-10-CM | POA: Diagnosis not present

## 2024-03-30 DIAGNOSIS — I447 Left bundle-branch block, unspecified: Secondary | ICD-10-CM | POA: Diagnosis not present

## 2024-03-30 DIAGNOSIS — I10 Essential (primary) hypertension: Secondary | ICD-10-CM | POA: Diagnosis not present

## 2024-03-30 LAB — BASIC METABOLIC PANEL WITH GFR
Anion gap: 6 (ref 5–15)
BUN: 56 mg/dL — ABNORMAL HIGH (ref 8–23)
CO2: 24 mmol/L (ref 22–32)
Calcium: 8.1 mg/dL — ABNORMAL LOW (ref 8.9–10.3)
Chloride: 101 mmol/L (ref 98–111)
Creatinine, Ser: 1.76 mg/dL — ABNORMAL HIGH (ref 0.61–1.24)
GFR, Estimated: 37 mL/min — ABNORMAL LOW (ref >60)
Glucose, Bld: 101 mg/dL — ABNORMAL HIGH (ref 70–99)
Potassium: 4.1 mmol/L (ref 3.5–5.1)
Sodium: 131 mmol/L — ABNORMAL LOW (ref 135–145)

## 2024-03-30 LAB — MAGNESIUM: Magnesium: 1.9 mg/dL (ref 1.7–2.4)

## 2024-03-30 LAB — COOXEMETRY PANEL
Carboxyhemoglobin: 1.3 % (ref 0.5–1.5)
Methemoglobin: <0.7 % (ref 0.0–1.5)
O2 Saturation: 49.8 %
Total hemoglobin: 8.1 g/dL — ABNORMAL LOW (ref 12.0–16.0)

## 2024-03-30 MED ORDER — SODIUM CHLORIDE 0.9 % IV SOLN: INTRAVENOUS | Status: AC

## 2024-03-30 NOTE — Progress Notes (Signed)
 Progress Note  Patient Name: Danny [name] Date of Encounter: 03/30/2024 [facility] Cardiologist: None  (previously Nahser)  Interval Summary   Breathing better.  Able to lie flat.  Vital Signs Vitals:   03/29/24 2356 03/30/24 0448 03/30/24 0503 03/30/24 0723  BP: 97/67 98/63  112/67  Pulse:    (!) 37  Resp:    18  Temp: 98.2 F (36.8 C) 97.8 F (36.6 C)  97.7 F (36.5 C)  TempSrc: Oral Oral  Oral  SpO2:    98%  Weight:   58.1 kg   Height:        Intake/Output Summary (Last 24 hours) at 03/30/2024 0903 Last data filed at 03/29/2024 2358 Gross per 24 hour  Intake 470 ml  Output 650 ml  Net -180 ml      03/30/2024    5:03 AM 03/29/2024    5:35 AM 03/28/2024    4:40 AM  Last 3 Weights  Weight (lbs) 128 lb 1.6 oz 127 lb 3.3 oz 127 lb 13.9 oz  Weight (kg) 58.106 kg 57.7 kg 58 kg      Telemetry/ECG  Sinus rhythm- Personally Reviewed  Physical Exam  GEN: No acute distress.   Neck: No JVD Cardiac: RRR, S2 is muffled but still present, 3/6 systolic murmur is mid peaking at the right upper sternal border, less prominent at the apex, no diastolic murmurs, rubs, or gallops.  Respiratory: Clear to auscultation bilaterally. GI: Soft, nontender, non-distended  MS: No edema  Assessment & Plan  89 year old man with CAD s/p CABG after presenting with non-STEMI 2023 (RIMA-LAD, LIMA-OM1, SVG-OM 2), CKD stage IIIa, LBBB, PAD, presents with cardiogenic shock and acute systolic and diastolic heart failure with a marked reduction in LV ejection fraction from 55 to 60% to 20% in the setting of severe aortic stenosis/moderate aortic insufficiency and moderate-severe mitral insufficiency.   Currently being evaluated for possible TAVR.  He has improved with medical therapy.  Presenting with surprisingly rapid progression of aortic valve disease compared to the transthoracic and transesophageal echocardiograms performed in 2023.  Suspect that a significant part of his  cardiomyopathy is also related to development of a new LBBB (QRS greater than 150 ms with substantial dyssynchrony echo).  QRS was still narrow on ECGs as recent as April 2025.  Plan for TEE and right heart catheterization.  Hopefully renal function will continue to improve and he can also undergo CT angiography for TAVR planning.  He is not a candidate for redo sternotomy with multiple valve surgery, but he may be a reasonable candidate for TAVR.  If aortic stenosis and possibly also LBBB related dyssynchrony can be addressed with TAVR and CRT-D, hopefully will see improvement in secondary MR and TR.  Informed Consent   Shared Decision Making/Informed Consent The risks, including but not limited to, [bleeding or vascular complications (1 in 500), pneumothorax (1 in 1600), arrhythmia (1 in 1000) and death (1 in 5000)], benefits (diagnostic support and/or management of heart failure, pulmonary hypertension) and alternatives of a right heart catheterization were discussed in detail with Mr. Yankowski and he is willing to proceed.   The risks [esophageal damage, perforation (1:10,000 risk), bleeding, pharyngeal hematoma as well as other potential complications associated with conscious sedation including aspiration, arrhythmia, respiratory failure and death], benefits (treatment guidance and diagnostic support) and alternatives of a transesophageal echocardiogram were discussed in detail with Mr. Walgren and he is willing to proceed.         For questions  or updates, please contact [facility] Please consult [email or web address] for contact info under         Signed, Jerel Balding, MD

## 2024-03-30 NOTE — Progress Notes (Signed)
 Mobility Specialist Progress Note:   03/30/24 1224  Mobility  Activity Pivoted/transferred from bed to chair  Level of Assistance Contact guard assist, steadying assist  Assistive Device Front wheel walker  Distance Ambulated (ft) 5 ft  Activity Response Tolerated well  Mobility Referral Yes  Mobility visit 1 Mobility  Mobility Specialist Start Time (ACUTE ONLY) 1045  Mobility Specialist Stop Time (ACUTE ONLY) 1100  Mobility Specialist Time Calculation (min) (ACUTE ONLY) 15 min   Pt received in bed agreeable to mobility. No c/o throughout. No physical assistance required just contact guard for safety. Was able to ambulate ot the chair w/o fault. Left in chair w/ call bell and personal belongings in reach. All needs met.  Thersia Minder Mobility Specialist  Please contact vis Secure Chat or  Rehab Office [phone]

## 2024-03-30 NOTE — Plan of Care (Signed)

## 2024-03-30 NOTE — Progress Notes (Signed)
 Progress Note   Patient: Danny [name] FMW:[id number] DOB: Sep 26, 1933 DOA: 03/25/2024     4 DOS: the patient was seen and examined on 03/30/2024   Brief hospital course: Mr. Pons was admitted to the hospital with the working diagnosis of heart failure exacerbation.   89 yo male with the past medical history of heart failure, aortic stenosis, coronary artery disease, CKD, and bladder cancer sp TURP who presented with generalized weakness and fatigue.  On his initial physical examination his blood pressure 117/79, HR 78, RR 19 and 02 saturation 90%  Lungs with no wheezing or rhonchi, heart with S1 and S2 present and regular, positive systolic murmur at the base, abdomen soft and not distended and positive lower extremity edema.   Echocardiogram with reduced LV systolic function, with signs of hypoperfusion.  Placed on milrinone  for inotropic support.   09/19 off milrinone . Consulted structural heart team.  09/20 volume status has improved, plan for TEE and right heart catheterization during this admission.   Assessment and Plan: Acute on chronic systolic CHF (congestive heart failure) (HCC) Echocardiogram with reduced LV systolic function < 20%, global hypokinesis, RV with moderate reduction in systolic function, LA with severe dilatation, RA with moderate dilatation, severe mitral valve regurgitation, moderate to severe tricuspid valve regurgitation, low flow severe aortic valve stenosis with moderate regurgitation, moderate pulmonary valve regurgitation,   Urine output is 650 ml Systolic blood pressure 100 mmHg.  Sv02 49.8    09/19 off milrinone   Holding on diuretic therapy for now.  Poor prognosis due to valvular heart disease.  Consulted structural heart team, plan for TEE and possible right heart catheterization.   Congested hepatopathy die to volume overload.  AST and ALT are trending down.  Continue supportive medical care. No hepatic encephalopathy.   Essential  hypertension Continue blood pressure monitoring   Chronic kidney disease, stage 3a (HCC) AKI, hyponatremia,   Renal function with serum cr trending down to 1,76 with K at 4.1 and serum bicarbonate at 24  Na 131 Mg 1.9   Continue close monitoring renal function and electrolytes.  Holding diuretic for now.   Coronary artery disease Hold on aspirin  due to anemia., Continue with statin.  Elevation in high sensitive troponin due to heart failure, no acute coronary syndrome.   PAD (peripheral artery disease) (HCC) Continue supportive medical care  Hyperlipidemia Continue statin therapy  Macrocytic anemia Thrombocytopenia.  Follow up hgb is 7,7 with plt 71 and wbc 4.8  Iron panel with serum iron at 146, TIBC 325, transferrin saturation 45 and ferritin 582   Possible anemia related to aortic stenosis.  Low platelet may suggest bone marrow failure.  Follow up cell in am.  Hold PRBC transfusion for now.   History of bladder cancer Follow up as outpatient.     Subjective: Patient with no chest pain or dyspnea, no PND, orthopnea or lower extremity edema, at the time of my visit he was sitting in the chair.   Physical Exam: Vitals:   03/30/24 0448 03/30/24 0503 03/30/24 0723 03/30/24 1128  BP: 98/63  112/67 108/73  Pulse:   (!) 37 82  Resp:   18 20  Temp: 97.8 F (36.6 C)  97.7 F (36.5 C) (!) 97.5 F (36.4 C)  TempSrc: Oral  Oral Oral  SpO2:   98% 98%  Weight:  58.1 kg    Height:       Neurology awake and alert ENT with mild pallor Cardiovascular with S1 and S2 present and  regular, positive systolic murmur crescendo decrescendo at the base with no gallops. No JVD Respiratory with mild rales at bases with no wheezing or rhonchi  Abdomen with no distention,soft and non tender No lower extremity edema   Data Reviewed:    Family Communication: no family at the bedside   Disposition: Status is: Inpatient Remains inpatient appropriate because: pending TEE    Planned Discharge Destination: Home    Author: Elidia Toribio Furnace, MD 03/30/2024 2:26 PM  For on call review [email or web address].

## 2024-03-30 NOTE — Plan of Care (Signed)
  Problem: Education: Goal: Knowledge of General Education information will improve Description: Including pain rating scale, medication(s)/side effects and non-pharmacologic comfort measures Outcome: Progressing   Problem: Clinical Measurements: Goal: Ability to maintain clinical measurements within normal limits will improve Outcome: Progressing   Problem: Cardiac: Goal: Ability to achieve and maintain adequate cardiopulmonary perfusion will improve Outcome: Progressing   Problem: Activity: Goal: Capacity to carry out activities will improve Outcome: Progressing   Problem: Education: Goal: Ability to verbalize understanding of medication therapies will improve Outcome: Progressing   Problem: Education: Goal: Ability to demonstrate management of disease process will improve Outcome: Progressing

## 2024-03-31 ENCOUNTER — Other Ambulatory Visit (HOSPITAL_COMMUNITY)

## 2024-03-31 ENCOUNTER — Inpatient Hospital Stay (HOSPITAL_COMMUNITY)

## 2024-03-31 ENCOUNTER — Encounter (HOSPITAL_COMMUNITY)
Admission: EM | Disposition: A | Discharge status: Home Health | Payer: Self-pay | Source: Home / Self Care | Attending: Internal Medicine

## 2024-03-31 DIAGNOSIS — I351 Nonrheumatic aortic (valve) insufficiency: Secondary | ICD-10-CM

## 2024-03-31 DIAGNOSIS — I2585 Chronic coronary microvascular dysfunction: Secondary | ICD-10-CM | POA: Diagnosis not present

## 2024-03-31 DIAGNOSIS — I10 Essential (primary) hypertension: Secondary | ICD-10-CM | POA: Diagnosis not present

## 2024-03-31 DIAGNOSIS — N1831 Chronic kidney disease, stage 3a: Secondary | ICD-10-CM | POA: Diagnosis not present

## 2024-03-31 DIAGNOSIS — I340 Nonrheumatic mitral (valve) insufficiency: Secondary | ICD-10-CM | POA: Diagnosis not present

## 2024-03-31 DIAGNOSIS — I5021 Acute systolic (congestive) heart failure: Secondary | ICD-10-CM | POA: Diagnosis not present

## 2024-03-31 DIAGNOSIS — I5023 Acute on chronic systolic (congestive) heart failure: Secondary | ICD-10-CM | POA: Diagnosis not present

## 2024-03-31 LAB — COOXEMETRY PANEL
Carboxyhemoglobin: 1.3 % (ref 0.5–1.5)
Methemoglobin: <0.7 % (ref 0.0–1.5)
O2 Saturation: 55.3 %
Total hemoglobin: 8.5 g/dL — ABNORMAL LOW (ref 12.0–16.0)

## 2024-03-31 LAB — BASIC METABOLIC PANEL WITH GFR
Anion gap: 7 (ref 5–15)
BUN: 55 mg/dL — ABNORMAL HIGH (ref 8–23)
CO2: 23 mmol/L (ref 22–32)
Calcium: 8.2 mg/dL — ABNORMAL LOW (ref 8.9–10.3)
Chloride: 101 mmol/L (ref 98–111)
Creatinine, Ser: 1.75 mg/dL — ABNORMAL HIGH (ref 0.61–1.24)
GFR, Estimated: 37 mL/min — ABNORMAL LOW (ref >60)
Glucose, Bld: 108 mg/dL — ABNORMAL HIGH (ref 70–99)
Potassium: 4.7 mmol/L (ref 3.5–5.1)
Sodium: 131 mmol/L — ABNORMAL LOW (ref 135–145)

## 2024-03-31 LAB — CBC
HCT: 24.3 % — ABNORMAL LOW (ref 39.0–52.0)
Hemoglobin: 8.1 g/dL — ABNORMAL LOW (ref 13.0–17.0)
MCH: 35.2 pg — ABNORMAL HIGH (ref 26.0–34.0)
MCHC: 33.3 g/dL (ref 30.0–36.0)
MCV: 105.7 fL — ABNORMAL HIGH (ref 80.0–100.0)
Platelets: 80 K/uL — ABNORMAL LOW (ref 150–400)
RBC: 2.3 MIL/uL — ABNORMAL LOW (ref 4.22–5.81)
RDW: 20.2 % — ABNORMAL HIGH (ref 11.5–15.5)
WBC: 6.6 K/uL (ref 4.0–10.5)
nRBC: 1.2 % — ABNORMAL HIGH (ref 0.0–0.2)

## 2024-03-31 LAB — ECHOCARDIOGRAM LIMITED
AV Mean grad: 19 mmHg
AV Peak grad: 32.7 mmHg
Ao pk vel: 2.86 m/s
Area-P 1/2: 6.12 cm2
Calc EF: 21.1 %
Est EF: 20
Height: 64 in
S' Lateral: 4.8 cm
Single Plane A2C EF: 18.5 %
Single Plane A4C EF: 22.5 %
Weight: 2056.45 [oz_av]

## 2024-03-31 LAB — MAGNESIUM: Magnesium: 1.9 mg/dL (ref 1.7–2.4)

## 2024-03-31 SURGERY — TRANSESOPHAGEAL ECHOCARDIOGRAM (TEE) (CATHLAB)
Anesthesia: Monitor Anesthesia Care

## 2024-03-31 NOTE — Progress Notes (Signed)
 Occupational Therapy Treatment Patient Details Name: Danny [name] MRN: [id number] DOB: [birth year] Today's Date: 03/31/2024   History of present illness 89 y/o M adm 03/25/24 with weakness, fatigue HFpEF, elevated troponin, AKI and Severe AS.  PMHx:  CAD s/p CABG, CHF, CKD, HTN, PAD, stroke, HLD, bladder CA   OT comments  Pt with increased fatigue as compared to evaluation. Goal was to walk to the sink to complete ADL tasks. Session took over 50 min to walk 5 feet, complete denture care and shaving from seated position. Pt was so fatigued that he could not complete shaving with his electric razor and required assistance. I get short of breath just talking. VSS.  Goal is to DC home however pt will need max HH services, including HHOT and HH Aide. Recommend CM providing daughter with home care services as well. Acute OT will continue to follow.  Will trial use of rollator.       If plan is discharge home, recommend the following:  A little help with walking and/or transfers;A lot of help with bathing/dressing/bathroom;Assistance with cooking/housework;Assist for transportation;Help with stairs or ramp for entrance   Equipment Recommendations  Other (comment) (rollator)    Recommendations for Other Services      Precautions / Restrictions Precautions Precautions: Fall Precaution/Restrictions Comments: easily fatigues       Mobility Bed Mobility Overal bed mobility: Modified Independent                  Transfers Overall transfer level: Needs assistance     Sit to Stand: Contact guard assist                 Balance     Sitting balance-Leahy Scale: Good       Standing balance-Leahy Scale: Fair                             ADL either performed or assessed with clinical judgement   ADL   Eating/Feeding: Minimal assistance   Grooming: Sitting (unable to complee grooming due to fatigue)                   Toilet Transfer: Minimal  assistance (HHA)           Functional mobility during ADLs: Minimal assistance (HHA)      Extremity/Trunk Assessment Upper Extremity Assessment Upper Extremity Assessment: Generalized weakness (B intrinsic hand weakness claw hand; decreased pinch adn grip strength)   Lower Extremity Assessment Lower Extremity Assessment: Defer to PT evaluation        Vision       Perception     Praxis     Communication     Cognition Arousal: Alert Behavior During Therapy: WFL for tasks assessed/performed Cognition: No apparent impairments                                        Cueing      Exercises      Shoulder Instructions       General Comments RPE 1-2 sitting EOB; 6 when completing grooming task    Pertinent Vitals/ Pain       Pain Assessment Pain Assessment: No/denies pain  Home Living  Prior Functioning/Environment              Frequency  Min 2X/week        Progress Toward Goals  OT Goals(current goals can now be found in the care plan section)  Progress towards OT goals: Progressing toward goals  Acute Rehab OT Goals Patient Stated Goal: to go home to be with is wife OT Goal Formulation: With patient Time For Goal Achievement: 04/10/24 Potential to Achieve Goals: Good ADL Goals Pt Will Perform Grooming: with modified independence;standing Pt Will Perform Lower Body Dressing: with modified independence;sit to/from stand;sitting/lateral leans Pt Will Transfer to Toilet: with modified independence;ambulating;regular height toilet Pt Will Perform Toileting - Clothing Manipulation and hygiene: with modified independence;sitting/lateral leans;sit to/from stand Additional ADL Goal #1: Pt will independently verbalize 3 energy conservation strategies for ADL tasks  Plan      Co-evaluation                 AM-PAC OT 6 Clicks Daily Activity     Outcome Measure   Help  from another person eating meals?: None Help from another person taking care of personal grooming?: A Little Help from another person toileting, which includes using toliet, bedpan, or urinal?: A Little Help from another person bathing (including washing, rinsing, drying)?: A Little Help from another person to put on and taking off regular upper body clothing?: A Little Help from another person to put on and taking off regular lower body clothing?: A Little 6 Click Score: 19    End of Session Equipment Utilized During Treatment: Gait belt  OT Visit Diagnosis: Unsteadiness on feet (R26.81);Other abnormalities of gait and mobility (R26.89);Muscle weakness (generalized) (M62.81)   Activity Tolerance Patient tolerated treatment well   Patient Left in bed;with call bell/phone within reach;with bed alarm set   Nurse Communication Mobility status        Time: 8493-8443 OT Time Calculation (min): 50 min  Charges: OT General Charges $OT Visit: 1 Visit OT Treatments $Self Care/Home Management : 38-52 mins  [name] Sink, OT/L   Acute OT Clinical Specialist Acute Rehabilitation Services Pager [phone] Office [phone]   [facility] 03/31/2024, 4:29 PM

## 2024-03-31 NOTE — Progress Notes (Signed)
 Advanced Heart Failure Rounding Note  Cardiologist: None  Chief Complaint: Heart Failure/AS Subjective:   9/17- Diuresed with IV lasix .   Stable off Milrinone . CO-OX 55%.  Renal function improving, have been holding diuretics.   Feels fine this morning. Walked in the hall this weekend and sat in the chair for most of yesterday.   Objective:   Weight Range: 58.3 kg Body mass index is 22.06 kg/m.   Vital Signs:   Temp:  [97.5 F (36.4 C)-98.2 F (36.8 C)] 97.6 F (36.4 C) (09/22 0743) Pulse Rate:  [72-88] 72 (09/22 0743) Resp:  [15-20] 19 (09/22 0743) BP: (108-123)/(62-73) 108/62 (09/22 0743) SpO2:  [94 %-100 %] 97 % (09/22 0743) Weight:  [58.3 kg] 58.3 kg (09/22 0555) Last BM Date : 03/30/24 (per pt)  Weight change: Filed Weights   03/29/24 0535 03/30/24 0503 03/31/24 0555  Weight: 57.7 kg 58.1 kg 58.3 kg    Intake/Output:   Intake/Output Summary (Last 24 hours) at 03/31/2024 9187 Last data filed at 03/30/2024 1853 Gross per 24 hour  Intake 120 ml  Output 700 ml  Net -580 ml    CVP unhooked Physical Exam   General:  elderly appearing.  No respiratory difficulty Neck: JVD ~6/7 cm.  Cor: Regular rate & rhythm. 3/6 RUSB murmur. Lungs: clear, diminished bases Extremities: no edema  Neuro: alert & oriented x 3. Affect pleasant.   Telemetry   SR 80s (Personally reviewed)    Labs    CBC Recent Labs    03/29/24 0600 03/31/24 0602  WBC 4.8 6.6  HGB 7.7* 8.1*  HCT 22.8* 24.3*  MCV 104.1* 105.7*  PLT 71* 80*   Basic Metabolic Panel Recent Labs    90/78/74 0500 03/31/24 0602  NA 131* 131*  K 4.1 4.7  CL 101 101  CO2 24 23  GLUCOSE 101* 108*  BUN 56* 55*  CREATININE 1.76* 1.75*  CALCIUM  8.1* 8.2*  MG 1.9 1.9   Liver Function Tests Recent Labs    03/29/24 0600  AST 76*  ALT 158*  ALKPHOS 115  BILITOT 1.0  PROT 6.0*  ALBUMIN  2.8*   No results for input(s): LIPASE, AMYLASE in the last 72 hours.  Cardiac Enzymes No results for  input(s): CKTOTAL, CKMB, CKMBINDEX, TROPONINI in the last 72 hours.  BNP: BNP (last 3 results) Recent Labs    03/26/24 0404  BNP >4,500.0*    ProBNP (last 3 results) No results for input(s): PROBNP in the last 8760 hours.   D-Dimer No results for input(s): DDIMER in the last 72 hours. Hemoglobin A1C No results for input(s): HGBA1C in the last 72 hours. Fasting Lipid Panel No results for input(s): CHOL, HDL, LDLCALC, TRIG, CHOLHDL, LDLDIRECT in the last 72 hours. Thyroid  Function Tests No results for input(s): TSH, T4TOTAL, T3FREE, THYROIDAB in the last 72 hours.  Invalid input(s): FREET3  Other results:  Imaging   No results found.   Medications:   Scheduled Medications:  Chlorhexidine  Gluconate Cloth  6 each Topical Daily   Influenza vac split trivalent PF  0.5 mL Intramuscular Tomorrow-1000   rosuvastatin   10 mg Oral Daily   sodium chloride  flush  3 mL Intravenous Q12H    Infusions:  sodium chloride  20 mL/hr at 03/31/24 9386    PRN Medications: acetaminophen , mouth rinse, senna, sodium chloride  flush, trimethobenzamide   Patient Profile  89 y.o. male with history of CAD w/ prior NSTEMI in 2023 s/p CABG, AS, ischemic CM with prior recovery in EF, CKD  IIIa, hx bradycardia, bladder cancer s/p TURPT, chronic anemia.   Admitted with acute systolic heart failure/cardiogenic shock in setting of severe AS and acute on chronic anemia.  Assessment/Plan   Acute systolic CHF>cardiogenic shock -EF 50-55% in 06/25 w/ reported moderate AS -Echo this admit: EF 15-20% w/ severe AS on preliminary review -Lactic acid 2.2>2.3>1.9 -Etiology for drop in EF not certain. Could be d/t AS. Also has LBBB w/ wide QRS.  -Stable off Milrinone . CO-OX 55%. - GDMT limited with shock/AKI -CVP unhooked. Does not appear volume overloaded on exam. Continue to hold diuretics at this time.  -GDMT limited by AKI   Severe AS - Noted on echo this admit -  Not certain if he would be a candidate for TAVR if he recovers from shock - Discussed with structural heart team today. Will order TTE to access RV function. May need RHC pending TTE findings. (Will cancel TEE)   CAD Elevated troponin -NSTEMI 2023 s/p CABG X 3 -HS troponin 484 -Suspect demand ischemia -Statin on hold d/t shock liver. No aspirin  with significant anemia.   Acute on chronic macrocytic anemia -Hbgb previously 9s-10s, 8.3 in April. 7.2 on admit s/p 1 u RBCs -FOBT X 1 negative. Hgb 8.1 today. No obvious source.   -? AVMs in setting of severe AS   AKI on CKD IIIa -Baseline Scr 1.2-1.3, up to 2.7 in setting of shock -Holding diuretics.  - Down trending  Elevated LFTs -Suspect shock liver -AST 205>216> 200>76 -ALT 207>226> 258> 158 -Tbili 1.2>2.1>1.8  -US  abdomen with biliary sludge and gallbladder wall thickening -CMET tomorrow   GOC: Palliative Care consulted. Now DNR.   Length of Stay: 5  Beckey LITTIE Coe, NP  03/31/2024, 8:12 AM  Advanced Heart Failure Team Pager [phone] (M-F; 7a - 5p)  Please contact [facility] for night-coverage after hours (5p -7a ) and weekends on [email or web address]

## 2024-03-31 NOTE — Progress Notes (Signed)
  Progress Note  Patient Name: Drayk Humbarger Sleeth Date of Encounter: 03/31/2024 [facility] Cardiologist: None (previously Nahser)   Interval Summary   Denies dyspnea at rest.  Additional diuresis net -0.5 L, but doubt that the collection of intake/outtake was complete.  Weight is unchanged. Creatinine stable at 1.75 (baseline earlier this year seem to be around 1.2-1.3)  Vital Signs Vitals:   03/30/24 2011 03/31/24 0057 03/31/24 0555 03/31/24 0743  BP:  111/66 123/66 108/62  Pulse: 88 83 87 72  Resp:  20 15 19   Temp:  97.9 F (36.6 C) 97.8 F (36.6 C) 97.6 F (36.4 C)  TempSrc:  Oral Oral Oral  SpO2: 100% 100% 94% 97%  Weight:   58.3 kg   Height:        Intake/Output Summary (Last 24 hours) at 03/31/2024 0920 Last data filed at 03/30/2024 1853 Gross per 24 hour  Intake 120 ml  Output 700 ml  Net -580 ml      03/31/2024    5:55 AM 03/30/2024    5:03 AM 03/29/2024    5:35 AM  Last 3 Weights  Weight (lbs) 128 lb 8.5 oz 128 lb 1.6 oz 127 lb 3.3 oz  Weight (kg) 58.3 kg 58.106 kg 57.7 kg      Telemetry/ECG  Sinus rhythm with relatively frequent PACs and rare PVCs- Personally Reviewed  Physical Exam  GEN: No acute distress.   Neck: No JVD Cardiac: RRR, soft S2, 3/6 mid peaking aortic ejection murmur, no diastolic murmurs, rubs, or gallops.  Respiratory: Clear to auscultation bilaterally. GI: Soft, nontender, non-distended  MS: No edema  Assessment & Plan   Discussed with structural heart team.  Canceled plans for TEE.  We can make further decisions for management based exclusively on repeat echocardiogram.   May be candidate for TAVR, but is not a candidate for mitral or tricuspid valve repair.   Will hold off prescribing additional diuretics until review of his follow-up repeat echo today.    For questions or updates, please contact [facility] Please consult [email or web address] for contact info under         Signed, Jerel Balding, MD

## 2024-03-31 NOTE — Progress Notes (Signed)
 Progress Note   Patient: Danny [name] FMW:[id number] DOB: 09-11-1933 DOA: 03/25/2024     5 DOS: the patient was seen and examined on 03/31/2024   Brief hospital course: Danny [name] was admitted to the hospital with the working diagnosis of heart failure exacerbation.   89 yo male with the past medical history of heart failure, aortic stenosis, coronary artery disease, CKD, and bladder cancer sp TURP who presented with generalized weakness and fatigue.  On his initial physical examination his blood pressure 117/79, HR 78, RR 19 and 02 saturation 90%  Lungs with no wheezing or rhonchi, heart with S1 and S2 present and regular, positive systolic murmur at the base, abdomen soft and not distended and positive lower extremity edema.   Na 133, K 5.4 cl 98 bicarbonate 18 glucose 126 bun 63 cr 2.2  AST 100 ALT 205  Lipase 100 total bilirubin 1.2  High sensitive troponin 375 and 360  Lactic acid 2.2  Wbc 6.1 hgb 7.2 plt 121   Echocardiogram with reduced LV systolic function, with signs of hypoperfusion.  Placed on milrinone  for inotropic support.   09/19 off milrinone . Consulted structural heart team.  09/20 volume status has improved, plan for TEE and right heart catheterization during this admission.  09/22 limited echocardiogram.   Assessment and Plan: Acute on chronic systolic CHF (congestive heart failure) (HCC) Echocardiogram with reduced LV systolic function < 20%, global hypokinesis, RV with moderate reduction in systolic function, LA with severe dilatation, RA with moderate dilatation, severe mitral valve regurgitation, moderate to severe tricuspid valve regurgitation, low flow severe aortic valve stenosis with moderate regurgitation, moderate pulmonary valve regurgitation,   Urine output is 700 ml Systolic blood pressure 100 mmHg.  Sv02 55.3   09/19 off milrinone   Holding on diuretic therapy for now.  Poor prognosis due to valvular heart disease.  Follow up with final  recommendations from cardiology for valvular heart disease.   Congested hepatopathy die to volume overload.  AST and ALT are trending down.  Continue supportive medical care. No hepatic encephalopathy.   Essential hypertension Continue blood pressure monitoring   Chronic kidney disease, stage 3a (HCC) AKI, hyponatremia,   Today renal function with serum cr at 1.75 with K at 4,7 and serum bicarbonate at 23  Na 131 and Mg 1,9    Continue close monitoring renal function and electrolytes.  Holding diuretic for now.   Coronary artery disease Hold on aspirin  due to anemia., Continue with statin.  Elevation in high sensitive troponin due to heart failure, no acute coronary syndrome.   PAD (peripheral artery disease) Continue supportive medical care  Hyperlipidemia Continue statin therapy  Macrocytic anemia Thrombocytopenia.  Follow up hgb is 8.1 with plt 80 and wbc 6.6  Iron panel with serum iron at 146, TIBC 325, transferrin saturation 45 and ferritin 582   Possible anemia related to aortic stenosis.  Low platelet may suggest bone marrow failure.  Stable cell count, no indication for transfusion.    History of bladder cancer Follow up as outpatient.     Subjective: Patient is feeling really well with no chest pain, no dyspnea, no PND or orthopnea   Physical Exam: Vitals:   03/31/24 0743 03/31/24 1125 03/31/24 1140 03/31/24 1553  BP: 108/62  112/67 116/81  Pulse: 72 84 75 86  Resp: 19  19 18   Temp: 97.6 F (36.4 C)  98.1 F (36.7 C) (!) 97.5 F (36.4 C)  TempSrc: Oral  Oral Axillary  SpO2: 97% 97%  97% 95%  Weight:      Height:       Neurology awake and alert  ENT with mild pallor Cardiovascular with S1 and S2 present and regular, with positive crescendo decrescendo systolic murmur at the apex, systolic murmur at the apex No JVD Respiratory with no rales or wheezing, no rhonchi  Abdomen with no distention  No lower extremity edema   Data  Reviewed:    Family Communication: no family at the bedside   Disposition: Status is: Inpatient Remains inpatient appropriate because: complete cardiac workup   Planned Discharge Destination: Home     Author: Elidia Toribio Furnace, MD 03/31/2024 4:25 PM  For on call review [email or web address].

## 2024-03-31 NOTE — Plan of Care (Signed)

## 2024-03-31 NOTE — Progress Notes (Signed)
 Physical Therapy Treatment Patient Details Name: Danny [name] MRN: [id number] DOB: 07-06-1934 Today's Date: 03/31/2024   History of Present Illness 89 y/o M adm 03/25/24 with weakness, fatigue HFpEF, elevated troponin, AKI and Severe AS.  PMHx:  CAD s/p CABG, CHF, CKD, HTN, PAD, stroke, HLD, bladder CA    PT Comments  Pt pleasant with flat affect, reports caring for wife but having daughter at home who can assist both of them. Pt reliant on RW use, fatigues rapidly with limited activity and requires increased periods of active rest/recovery between transfers/activity. Pt educated for HEP, progression and mobility. Will continue to follow with HHPT recommended. Increased activity and transfers acutely highly encouraged.   HR 82-84 SPO2 97% on RA    If plan is discharge home, recommend the following: A lot of help with walking and/or transfers;A lot of help with bathing/dressing/bathroom;Assistance with cooking/housework   Can travel by private vehicle        Equipment Recommendations  None recommended by PT    Recommendations for Other Services       Precautions / Restrictions Precautions Precautions: Fall Recall of Precautions/Restrictions: Intact     Mobility  Bed Mobility Overal bed mobility: Modified Independent                  Transfers Overall transfer level: Needs assistance   Transfers: Sit to/from Stand Sit to Stand: Contact guard assist           General transfer comment: cues for hand placement, safety and sequence. Pt performed 5 repeated sit to stand trials after gait with significant seated rest prior to attempting and grossly 4 min to complete    Ambulation/Gait Ambulation/Gait assistance: Contact guard assist Gait Distance (Feet): 40 Feet Assistive device: Rolling walker (2 wheels) Gait Pattern/deviations: Step-through pattern, Decreased stride length, Trunk flexed   Gait velocity interpretation: <1.8 ft/sec, indicate of risk for  recurrent falls   General Gait Details: cues for posture and proximity to RW. Pt denied walking in hall or performing laps, limited by fatigue   Stairs             Wheelchair Mobility     Tilt Bed    Modified Rankin (Stroke Patients Only)       Balance Overall balance assessment: Needs assistance Sitting-balance support: No upper extremity supported, Feet supported Sitting balance-Leahy Scale: Good Sitting balance - Comments: EOB without support   Standing balance support: Bilateral upper extremity supported, Reliant on assistive device for balance, During functional activity Standing balance-Leahy Scale: Poor Standing balance comment: RW for gait                            Communication Communication Communication: No apparent difficulties  Cognition Arousal: Alert Behavior During Therapy: WFL for tasks assessed/performed   PT - Cognitive impairments: No apparent impairments                         Following commands: Impaired Following commands impaired: Follows one step commands with increased time    Cueing Cueing Techniques: Verbal cues  Exercises General Exercises - Lower Extremity Long Arc Quad: AROM, Both, 20 reps, Seated, Strengthening    General Comments        Pertinent Vitals/Pain Pain Assessment Pain Assessment: No/denies pain    Home Living  Prior Function            PT Goals (current goals can now be found in the care plan section) Progress towards PT goals: Progressing toward goals    Frequency    Min 2X/week      PT Plan      Co-evaluation              AM-PAC PT 6 Clicks Mobility   Outcome Measure  Help needed turning from your back to your side while in a flat bed without using bedrails?: None Help needed moving from lying on your back to sitting on the side of a flat bed without using bedrails?: None Help needed moving to and from a bed to a chair  (including a wheelchair)?: A Little Help needed standing up from a chair using your arms (e.g., wheelchair or bedside chair)?: A Little Help needed to walk in hospital room?: A Little Help needed climbing 3-5 steps with a railing? : A Lot 6 Click Score: 19    End of Session   Activity Tolerance: Patient tolerated treatment well;Patient limited by fatigue Patient left: in chair;with call bell/phone within reach;with chair alarm set Nurse Communication: Mobility status PT Visit Diagnosis: Other abnormalities of gait and mobility (R26.89);Muscle weakness (generalized) (M62.81);Difficulty in walking, not elsewhere classified (R26.2)     Time: 9086-9051 PT Time Calculation (min) (ACUTE ONLY): 35 min  Charges:    $Therapeutic Activity: 23-37 mins PT General Charges $$ ACUTE PT VISIT: 1 Visit                     Lenoard SQUIBB, PT Acute Rehabilitation Services Office: [phone]    Lenoard NOVAK [name] 03/31/2024, 11:32 AM

## 2024-04-01 ENCOUNTER — Inpatient Hospital Stay (HOSPITAL_COMMUNITY)

## 2024-04-01 DIAGNOSIS — I5021 Acute systolic (congestive) heart failure: Secondary | ICD-10-CM | POA: Diagnosis not present

## 2024-04-01 DIAGNOSIS — Z789 Other specified health status: Secondary | ICD-10-CM | POA: Diagnosis not present

## 2024-04-01 DIAGNOSIS — Z515 Encounter for palliative care: Secondary | ICD-10-CM | POA: Diagnosis not present

## 2024-04-01 DIAGNOSIS — I447 Left bundle-branch block, unspecified: Secondary | ICD-10-CM | POA: Diagnosis not present

## 2024-04-01 DIAGNOSIS — J90 Pleural effusion, not elsewhere classified: Secondary | ICD-10-CM

## 2024-04-01 DIAGNOSIS — Z7189 Other specified counseling: Secondary | ICD-10-CM | POA: Diagnosis not present

## 2024-04-01 DIAGNOSIS — I10 Essential (primary) hypertension: Secondary | ICD-10-CM | POA: Diagnosis not present

## 2024-04-01 DIAGNOSIS — I700 Atherosclerosis of aorta: Secondary | ICD-10-CM | POA: Diagnosis not present

## 2024-04-01 DIAGNOSIS — Z66 Do not resuscitate: Secondary | ICD-10-CM | POA: Diagnosis not present

## 2024-04-01 DIAGNOSIS — I5023 Acute on chronic systolic (congestive) heart failure: Secondary | ICD-10-CM | POA: Diagnosis not present

## 2024-04-01 DIAGNOSIS — N1831 Chronic kidney disease, stage 3a: Secondary | ICD-10-CM | POA: Diagnosis not present

## 2024-04-01 DIAGNOSIS — I2585 Chronic coronary microvascular dysfunction: Secondary | ICD-10-CM | POA: Diagnosis not present

## 2024-04-01 LAB — COMPREHENSIVE METABOLIC PANEL WITH GFR
ALT: 86 U/L — ABNORMAL HIGH (ref 0–44)
AST: 38 U/L (ref 15–41)
Albumin: 2.6 g/dL — ABNORMAL LOW (ref 3.5–5.0)
Alkaline Phosphatase: 83 U/L (ref 38–126)
Anion gap: 10 (ref 5–15)
BUN: 58 mg/dL — ABNORMAL HIGH (ref 8–23)
CO2: 22 mmol/L (ref 22–32)
Calcium: 8.4 mg/dL — ABNORMAL LOW (ref 8.9–10.3)
Chloride: 98 mmol/L (ref 98–111)
Creatinine, Ser: 1.68 mg/dL — ABNORMAL HIGH (ref 0.61–1.24)
GFR, Estimated: 39 mL/min — ABNORMAL LOW (ref >60)
Glucose, Bld: 102 mg/dL — ABNORMAL HIGH (ref 70–99)
Potassium: 5 mmol/L (ref 3.5–5.1)
Sodium: 130 mmol/L — ABNORMAL LOW (ref 135–145)
Total Bilirubin: 1.1 mg/dL (ref 0.0–1.2)
Total Protein: 5.9 g/dL — ABNORMAL LOW (ref 6.5–8.1)

## 2024-04-01 LAB — COOXEMETRY PANEL
Carboxyhemoglobin: 1.2 % (ref 0.5–1.5)
Methemoglobin: 0.7 % (ref 0.0–1.5)
O2 Saturation: 61 %
Total hemoglobin: 8.4 g/dL — ABNORMAL LOW (ref 12.0–16.0)

## 2024-04-01 MED ORDER — FUROSEMIDE 10 MG/ML IJ SOLN
40.0000 mg | Freq: Once | INTRAMUSCULAR | Status: AC
  Administered 2024-04-01: 40 mg via INTRAVENOUS
  Filled 2024-04-01: qty 4

## 2024-04-01 MED ORDER — IOHEXOL 350 MG/ML SOLN
100.0000 mL | Freq: Once | INTRAVENOUS | Status: AC | PRN
  Administered 2024-04-01: 100 mL via INTRAVENOUS

## 2024-04-01 MED ORDER — FUROSEMIDE 10 MG/ML IJ SOLN
60.0000 mg | Freq: Once | INTRAMUSCULAR | Status: AC
  Administered 2024-04-01: 60 mg via INTRAVENOUS
  Filled 2024-04-01: qty 6

## 2024-04-01 MED ORDER — CARMEX CLASSIC LIP BALM EX OINT
1.0000 | TOPICAL_OINTMENT | CUTANEOUS | Status: DC | PRN
  Filled 2024-04-01: qty 10

## 2024-04-01 MED ORDER — GUAIFENESIN-DM 100-10 MG/5ML PO SYRP
5.0000 mL | ORAL_SOLUTION | ORAL | Status: DC | PRN
  Filled 2024-04-01: qty 5

## 2024-04-01 NOTE — Progress Notes (Signed)
 Daily Progress Note   Date: 04/01/2024   Patient Name: Danny [name]  DOB: May 14, 1934  MRN: [id number]  Age / Sex: 89 y.o., male  Attending Physician: Noralee Elidia Sieving,* Primary Care Physician: [name] LABOR, MD Admit Date: 03/25/2024 Length of Stay: 6 days  Reason for Follow-up: Establishing goals of care  Past Medical History:  Diagnosis Date   (HFpEF) heart failure with preserved ejection fraction (HCC)    Echo 1/23: Inferior AK, mild aortic stenosis (mean 9 mmHg, V-max 210.5 cm/s, DI 0.70), EF 55-60, GR 1 DD, normal RVSF, normal PASP, trivial MR, mild AI, borderline dilation of aortic root (39 mm)   Acid reflux    hiatal hernia   Anemia    Bladder cancer (HCC)    CAD (coronary artery disease)    S/p non-STEMI 1/23 >> s/p CABG   Carotid artery disease 08/23/2021   Pre-CABG Dopplers 1/23:R ICA 40-59; L ICA 1-39 // Carotid US  07/20/2023: RICA 40-59; LICA 1-39   CHF (congestive heart failure) (HCC)    Chronic kidney disease (CKD)    Coronary artery disease involving native coronary artery of native heart without angina pectoris 05/16/2022   Inf NSTEMI s/p CABG in 07/2021 (RIMA-LAD, LIMA-OM1, S-OM2)   Diverticulitis    Erectile dysfunction 04/20/2015   Essential hypertension 04/20/2015   History of colon polyps    History of kidney stones    History of stroke    Noted on brain MRI 07/2021   Hyperlipidemia 05/28/2015   PAD (peripheral artery disease)    Pre-CABG Dopplers 1/23: Right ABI 0.65; left ABI 0.82   Skin cancer    Stroke (HCC)    noted MRI 2023 pt. unaware    Subjective:   Subjective: Chart Reviewed. Updates received. Patient Assessed. Created space and opportunity for patient  and family to explore thoughts and feelings regarding current medical situation.  Today's Discussion: Today before meeting with the patient/family, I reviewed the chart notes including nursing note from yesterday, heart failure note from yesterday, cardiology note from  yesterday, PT note from yesterday, internal medicine note from yesterday, OT note from yesterday, nursing note from today, cardiology note from today, heart failure note from today. I also reviewed vital signs, nursing flowsheets, medication administrations record, labs, and imaging. Labs reviewed include CMP with continued improvement in creatinine from 2.6 five days ago progressively down to 1.68 today, near baseline, in the setting of acute worsening of heart failure, cardiology team plans to stop milrinone  and monitor over the weekend.  Cooximetry is stable at 61 today, also remains off inotropes.  I also discussed with Dr. Noralee with the hospitalist team.  Today saw the patient at bedside, no family was present.  He notes more dyspnea today, difficulty in more getting up.  However, he did get out of bed and onto the scale.  Does not seem to have ambulated today.  He is overall feeling okay.  We talked about the CT scan today which he said was quite an ordeal.  We talked about the results are pending and this is part of the workup to see if there are options to help his heart.  We celebrated the fact that his kidneys seem to have been making improvement over the weekend, although we would have to wait for further information about the workup to know what options to take.  At 1 point he asked me what you think I should do?  and I told him it is hard to have an  opinion without knowing what options are available.  I encouraged him to see what cardiology says may be possible, but anticipate further test to know for sure.  He states his daughter has not been today, is hoping that she will be by later today.  I told him I would call and update his daughter and he expressed appreciation.  After seeing the patient I attempted to call his daughter twice to update her.  Both times I received a message that her voicemail box is full.  I shared that I would come back and check on him tomorrow.  I provided  emotional and general support through therapeutic listening, empathy, sharing of stories, and other techniques. I answered all questions and addressed all concerns to the best of my ability.  Review of Systems  Constitutional:        Feels okay  Respiratory:  Positive for shortness of breath (Worse today). Negative for chest tightness.   Cardiovascular:  Negative for chest pain.  Gastrointestinal:  Negative for abdominal pain, nausea and vomiting.    Objective:   Primary Diagnoses: Present on Admission:  Acute on chronic systolic CHF (congestive heart failure) (HCC)  Macrocytic anemia  Hyperlipidemia  Essential hypertension  Chronic kidney disease, stage 3a (HCC)  PAD (peripheral artery disease)   Vital Signs:  BP 116/68 (BP Location: Left Arm)   Pulse 68   Temp (!) 97.5 F (36.4 C) (Oral)   Resp 20   Ht 5' 4 (1.626 m)   Wt 58.3 kg   SpO2 100%   BMI 22.06 kg/m   Physical Exam Vitals and nursing note reviewed.  Constitutional:      General: He is not in acute distress.    Appearance: He is ill-appearing. He is not toxic-appearing.  HENT:     Head: Normocephalic and atraumatic.  Cardiovascular:     Rate and Rhythm: Normal rate.     Heart sounds: Murmur heard.     Systolic murmur is present.  Pulmonary:     Effort: Pulmonary effort is normal. No respiratory distress.     Breath sounds: No wheezing or rhonchi.  Abdominal:     General: Abdomen is flat. Bowel sounds are normal. There is no distension.     Palpations: Abdomen is soft.  Skin:    General: Skin is warm and dry.  Neurological:     General: No focal deficit present.     Mental Status: He is alert and oriented to person, place, and time.  Psychiatric:        Mood and Affect: Mood normal.        Behavior: Behavior normal.     Palliative Assessment/Data: 60-70%   Assessment & Plan:   HPI/Patient Profile:  89 y.o. male  with past medical history of heart failure with preserved ejection fraction,  hyperlipidemia, CAD s/p CABG in 2023, CKD, and recurrent bladder cancer s/p TURP who presented with complaints of generalized weakness and fatigue over the last 5 days.  He was admitted on 03/25/2024 with HFpEF, elevated troponin macrocytic anemia, AKI superimposed on CKD 3B, transaminitis papillary bladder tumor, and others.  After workup determined to be in acute systolic heart failure with EF less than 20%, severe aortic stenosis, cardiogenic shock, lactic acidosis, AKI, acute liver failure, LBBB, and others.   Palliative medicine was consulted for GOC conversations.  SUMMARY OF RECOMMENDATIONS   DNR-limited Continue full scope of care otherwise Time for workup and options per cardiology/heart failure Continued supportive patient and  family Ongoing goals of care as needed based on clinical picture Palliative medicine will continue to follow to assist in decision making and goals of care  Symptom Management:  Per primary team Palliative medicine is available to assist as needed  Code Status: DNR - Limited (DNR/DNI)  Prognosis: Unable to determine  Discharge Planning: To Be Determined  Discussed with: Patient, medical team, nursing team  Thank you for allowing us  to participate in the care of Elijio R Spalla PMT will continue to support holistically.  Billing based on MDM: Moderate  Detailed review of medical records (labs, imaging, vital signs), medically appropriate exam, discussed with treatment team, counseling and education to patient, family, & staff, documenting clinical information, medication management, coordination of care  Camellia Kays, NP Palliative Medicine Team  Team Phone # [phone] (Nights/Weekends)  03/08/2021, 8:17 AM

## 2024-04-01 NOTE — Progress Notes (Signed)
 Advanced Heart Failure Rounding Note  Cardiologist: None  Chief Complaint: Heart Failure/AS Subjective:   9/17- Diuresed with IV lasix .   SOB with exertion. Feels worse today Objective:   Weight Range: 58.3 kg Body mass index is 22.06 kg/m.   Vital Signs:   Temp:  [97.4 F (36.3 C)-98.3 F (36.8 C)] 97.4 F (36.3 C) (09/23 0329) Pulse Rate:  [72-147] 76 (09/23 0329) Resp:  [16-19] 18 (09/23 0329) BP: (98-127)/(54-81) 127/72 (09/23 0329) SpO2:  [95 %-100 %] 100 % (09/23 0329) Weight:  [58.3 kg] 58.3 kg (09/23 0329) Last BM Date : 03/30/24 (per pt)  Weight change: Filed Weights   03/30/24 0503 03/31/24 0555 04/01/24 0329  Weight: 58.1 kg 58.3 kg 58.3 kg    Intake/Output:   Intake/Output Summary (Last 24 hours) at 04/01/2024 0709 Last data filed at 04/01/2024 0500 Gross per 24 hour  Intake 927.44 ml  Output 950 ml  Net -22.56 ml   CVP 10-11  Physical Exam   General:   No resp difficulty Neck: JVP 10-11  Cor: Irregular rate & rhythm.  Lungs: clear Abdomen: soft, nontender, nondistended.  Extremities: no  edema Neuro: alert & oriented x3   Telemetry  A fib   Labs    CBC Recent Labs    03/31/24 0602  WBC 6.6  HGB 8.1*  HCT 24.3*  MCV 105.7*  PLT 80*   Basic Metabolic Panel Recent Labs    90/78/74 0500 03/31/24 0602  NA 131* 131*  K 4.1 4.7  CL 101 101  CO2 24 23  GLUCOSE 101* 108*  BUN 56* 55*  CREATININE 1.76* 1.75*  CALCIUM  8.1* 8.2*  MG 1.9 1.9   Liver Function Tests No results for input(s): AST, ALT, ALKPHOS, BILITOT, PROT, ALBUMIN  in the last 72 hours.  No results for input(s): LIPASE, AMYLASE in the last 72 hours.  Cardiac Enzymes No results for input(s): CKTOTAL, CKMB, CKMBINDEX, TROPONINI in the last 72 hours.  BNP: BNP (last 3 results) Recent Labs    03/26/24 0404  BNP >4,500.0*    ProBNP (last 3 results) No results for input(s): PROBNP in the last 8760 hours.   D-Dimer No results  for input(s): DDIMER in the last 72 hours. Hemoglobin A1C No results for input(s): HGBA1C in the last 72 hours. Fasting Lipid Panel No results for input(s): CHOL, HDL, LDLCALC, TRIG, CHOLHDL, LDLDIRECT in the last 72 hours. Thyroid  Function Tests No results for input(s): TSH, T4TOTAL, T3FREE, THYROIDAB in the last 72 hours.  Invalid input(s): FREET3  Other results:  Imaging   ECHOCARDIOGRAM LIMITED Result Date: 03/31/2024    TRANSESOPHOGEAL ECHO REPORT   Patient Name:   Danny [name] Date of Exam: 03/31/2024 Medical Rec #:  [id number]      Height:       64.0 in Accession #:    [id number]     Weight:       128.5 lb Date of Birth:  [birth year]     BSA:          1.621 m Patient Age:    89 years       BP:           123/66 mmHg Patient Gender: M              HR:           79 bpm. Exam Location:  Inpatient Procedure: Limited Echo, Color Doppler and Cardiac Doppler (Both Spectral and  Color Flow Doppler were utilized during procedure). Indications:    Aortic Stenosis  History:        Patient has prior history of Echocardiogram examinations, most                 recent 03/26/2024.  Sonographer:    Tinnie Gosling RDCS Referring Phys: [id number] GLENDIA DASEN WEAVER IMPRESSIONS  1. Left ventricular ejection fraction, by estimation, is 20%. The left ventricle has severely decreased function. The left ventricle demonstrates global hypokinesis. The left ventricular internal cavity size was mildly dilated. No LV thrombus noted.  2. Right ventricular systolic function is mildly reduced. The right ventricular size is normal. Tricuspid regurgitation signal is inadequate for assessing PA pressure.  3. Left atrial size was mildly dilated.  4. The mitral valve is degenerative. Moderate mitral valve regurgitation. No evidence of mitral stenosis. Moderate mitral annular calcification.  5. The aortic valve is tricuspid. There is severe calcifcation of the aortic valve. Aortic valve regurgitation is  mild. Aortic valve mean gradient measures 19.0 mmHg. Visually, I suspect low flow/low gradient severe aortic stenosis. However, no LVOT VTI  measurement was made, unable to calculate aortic valve area. Would suggest repeat study with fully interrogation of the aortic valve.  6. The inferior vena cava is normal in size with <50% respiratory variability, suggesting right atrial pressure of 8 mmHg.  7. Left pleural effusion present. FINDINGS  Left Ventricle: Left ventricular ejection fraction, by estimation, is 20%. The left ventricle has severely decreased function. The left ventricle demonstrates global hypokinesis. The left ventricular internal cavity size was mildly dilated. Right Ventricle: The right ventricular size is normal. No increase in right ventricular wall thickness. Right ventricular systolic function is mildly reduced. Tricuspid regurgitation signal is inadequate for assessing PA pressure. Left Atrium: Left atrial size was mildly dilated. Right Atrium: Right atrial size was normal in size. Pericardium: Left pleural effusion present. There is no evidence of pericardial effusion. Mitral Valve: The mitral valve is degenerative in appearance. There is mild calcification of the mitral valve leaflet(s). Moderate mitral annular calcification. Moderate mitral valve regurgitation. No evidence of mitral valve stenosis. Tricuspid Valve: The tricuspid valve is not assessed. Aortic Valve: The aortic valve is tricuspid. There is severe calcifcation of the aortic valve. Aortic valve regurgitation is mild. Aortic valve mean gradient measures 19.0 mmHg. Aortic valve peak gradient measures 32.7 mmHg. Pulmonic Valve: The pulmonic valve was not assessed. Aorta: The aortic root is normal in size and structure. Venous: The inferior vena cava is normal in size with less than 50% respiratory variability, suggesting right atrial pressure of 8 mmHg. IAS/Shunts: No atrial level shunt detected by color flow Doppler.  LEFT  VENTRICLE PLAX 2D LVIDd:         5.30 cm LVIDs:         4.80 cm LV PW:         0.90 cm LV IVS:        0.90 cm LVOT diam:     2.10 cm LVOT Area:     3.46 cm  LV Volumes (MOD) LV vol d, MOD A2C: 162.0 ml LV vol d, MOD A4C: 160.0 ml LV vol s, MOD A2C: 132.0 ml LV vol s, MOD A4C: 124.0 ml LV SV MOD A2C:     30.0 ml LV SV MOD A4C:     160.0 ml LV SV MOD BP:      34.3 ml IVC IVC diam: 1.80 cm LEFT ATRIUM  Index LA diam:    4.10 cm 2.53 cm/m  AORTIC VALVE AV Vmax:      286.00 cm/s AV Vmean:     206.000 cm/s AV VTI:       0.549 m AV Peak Grad: 32.7 mmHg AV Mean Grad: 19.0 mmHg  AORTA Ao Root diam: 3.50 cm MITRAL VALVE MV Area (PHT): 6.12 cm    SHUNTS MV Decel Time: 124 msec    Systemic Diam: 2.10 cm MV E velocity: 92.00 cm/s MV A velocity: 70.40 cm/s MV E/A ratio:  1.31 Dalton McleanMD Electronically signed by Ezra Kanner Signature Date/Time: 03/31/2024/9:04:33 PM    Final      Medications:   Scheduled Medications:  Chlorhexidine  Gluconate Cloth  6 each Topical Daily   Influenza vac split trivalent PF  0.5 mL Intramuscular Tomorrow-1000   rosuvastatin   10 mg Oral Daily   sodium chloride  flush  3 mL Intravenous Q12H    Infusions:    PRN Medications: acetaminophen , mouth rinse, senna, sodium chloride  flush, trimethobenzamide   Patient Profile  89 y.o. male with history of CAD w/ prior NSTEMI in 2023 s/p CABG, AS, ischemic CM with prior recovery in EF, CKD IIIa, hx bradycardia, bladder cancer s/p TURPT, chronic anemia.   Admitted with acute systolic heart failure/cardiogenic shock in setting of severe AS and acute on chronic anemia.  Assessment/Plan   Acute systolic CHF>cardiogenic shock -EF 50-55% in 06/25 w/ reported moderate AS -Echo this admit: EF 15-20% w/ severe AS on preliminary review -Lactic acid 2.2>2.3>1.9 -Etiology for drop in EF not certain. Could be d/t AS. Also has LBBB w/ wide QRS.  -Stable off Milrinone . CO-OX remains stable. Give 40 mg iV lasix  bid today. . - GDMT  limited with shock/AKI -CVP 10-11. Volume back up. Dyspnea with minimal exertion.  Give 40 mg IV lasix  bid today.  -GDMT limited by AKI   Severe AS - Noted on echo this admit - Not certain if he would be a candidate for TAVR if he recovers from shock - Discussed with structural heart team today. Will order TTE to access RV function. May need RHC pending TTE findings. (Will cancel TEE)   CAD Elevated troponin -NSTEMI 2023 s/p CABG X 3 -HS troponin 484 -Suspect demand ischemia -Statin on hold d/t shock liver. No aspirin  with significant anemia.   Acute on chronic macrocytic anemia -Hbgb previously 9s-10s, 8.3 in April. 7.2 on admit s/p 1 u RBCs -FOBT X 1 negative.  -Check CBC -? AVMs in setting of severe AS   AKI on CKD IIIa -Baseline Scr 1.2-1.3, peaked 2.7 in setting of shock, 1.7 today.  Elevated LFTs -Suspect shock liver -LFTs trending down  -US  abdomen with biliary sludge and gallbladder wall thickening    GOC: Palliative Care consulted. Now DNR.   Length of Stay: 6  Elizzie Westergard, NP  04/01/2024, 7:09 AM  Advanced Heart Failure Team Pager [phone] (M-F; 7a - 5p)  Please contact [facility] for night-coverage after hours (5p -7a ) and weekends on [email or web address]

## 2024-04-01 NOTE — Progress Notes (Signed)
  Progress Note  Patient Name: Danny [name] Date of Encounter: 04/01/2024 [facility] Cardiologist: (Nahser)  Interval Summary   Feels a little more dyspneic today.  Echo yesterday showed improved MR, TR and improved RV function, but still shows severely depressed left ventricular systolic function with ejection fraction 20% and evidence of increased mean left atrial pressure and right atrial pressure. Creatinine has improved further, now down to 1.68 (not quite back to baseline 1.2-1.3).  Weight unchanged.  Vital Signs Vitals:   03/31/24 1900 03/31/24 2300 04/01/24 0329 04/01/24 0802  BP: (!) 98/54 124/71 127/72 133/83  Pulse: (!) 102 (!) 147 76   Resp: 16 16 18    Temp: 98.3 F (36.8 C) 97.8 F (36.6 C) (!) 97.4 F (36.3 C) (!) 97.4 F (36.3 C)  TempSrc: Oral Oral Oral Oral  SpO2: 100% 100% 100%   Weight:   58.3 kg   Height:        Intake/Output Summary (Last 24 hours) at 04/01/2024 0829 Last data filed at 04/01/2024 0500 Gross per 24 hour  Intake 927.44 ml  Output 950 ml  Net -22.56 ml      04/01/2024    3:29 AM 03/31/2024    5:55 AM 03/30/2024    5:03 AM  Last 3 Weights  Weight (lbs) 128 lb 8.5 oz 128 lb 8.5 oz 128 lb 1.6 oz  Weight (kg) 58.3 kg 58.3 kg 58.106 kg      Telemetry/ECG  Sinus rhythm with frequent premature atrial contractions- Personally Reviewed  Physical Exam  GEN: No acute distress, but less comfortable at rest.  Sitting up in bed..   Neck: No JVD Cardiac: RRR.  Frequent ectopy, S2 is soft but still distinct, 3/6 mid peaking aortic ejection murmur radiating broadly, no diastolic murmurs, rubs, or gallops.  Respiratory: No rales, but breathing appears more labored today. GI: Soft, nontender, non-distended  MS: No edema  Assessment & Plan  With improved/stable renal function, plan CT scan today to see if he is a candidate for TAVR.  Hopefully will be able to evaluate his coronary grafts on the CT as well, if not may need right and left  heart catheterization later this week. He will require additional diuresis, cautiously to avoid precipitating low output state again. Anemia is clearly worsening his heart failure.  Consider another unit of PRBC transfusion    For questions or updates, please contact [facility] Please consult [email or web address] for contact info under         Signed, Jerel Balding, MD

## 2024-04-01 NOTE — Plan of Care (Signed)
  Problem: Health Behavior/Discharge Planning: Goal: Ability to manage health-related needs will improve Outcome: Progressing   Problem: Clinical Measurements: Goal: Ability to maintain clinical measurements within normal limits will improve Outcome: Progressing Goal: Will remain free from infection Outcome: Progressing Goal: Diagnostic test results will improve Outcome: Progressing   Problem: Coping: Goal: Level of anxiety will decrease Outcome: Progressing

## 2024-04-01 NOTE — Procedures (Signed)
 Thoracentesis  Procedure Note  Danny [name]  [id number]  1933-10-20  Date:04/01/24  Time:5:55 PM   Provider Performing:Danny [name]   Procedure: Thoracentesis with imaging guidance ([id number])  Indication(s) Pleural Effusion  Consent Risks of the procedure as well as the alternatives and risks of each were explained to the patient and/or caregiver.  Consent for the procedure was obtained and is signed in the bedside chart  Anesthesia Topical only with 1% lidocaine     Time Out Verified patient identification, verified procedure, site/side was marked, verified correct patient position, special equipment/implants available, medications/allergies/relevant history reviewed, required imaging and test results available.   Sterile Technique Maximal sterile technique including full sterile barrier drape, hand hygiene, sterile gown, sterile gloves, mask, hair covering, sterile ultrasound probe cover (if used).  Procedure Description Ultrasound was used to identify appropriate pleural anatomy for placement and overlying skin marked.  Area of drainage cleaned and draped in sterile fashion. Lidocaine  was used to anesthetize the skin and subcutaneous tissue. 1300 cc's of straw colored fluid was drained from the left pleural space. Catheter then removed and bandaid applied to site.   Complications/Tolerance None; patient tolerated the procedure well. Chest X-ray is ordered to confirm no post-procedural complication.   EBL Minimal   Specimen(s) None   Danny [name], [place] [facility] 04/01/24 5:57 PM  Please see [email or web address] for pager details.  From 7A-7P if no response, please call [phone] After hours, please call ELink [phone]

## 2024-04-01 NOTE — Progress Notes (Signed)
 Progress Note   Patient: Danny [name] FMW:[id number] DOB: [birth year] DOA: 03/25/2024     6 DOS: the patient was seen and examined on 04/01/2024   Brief hospital course: Mr. Neyhart was admitted to the hospital with the working diagnosis of heart failure exacerbation.   89 yo male with the past medical history of heart failure, aortic stenosis, coronary artery disease, CKD, and bladder cancer sp TURP who presented with generalized weakness and fatigue.  On his initial physical examination his blood pressure 117/79, HR 78, RR 19 and 02 saturation 90%  Lungs with no wheezing or rhonchi, heart with S1 and S2 present and regular, positive systolic murmur at the base, abdomen soft and not distended and positive lower extremity edema.   Na 133, K 5.4 cl 98 bicarbonate 18 glucose 126 bun 63 cr 2.2  AST 100 ALT 205  Lipase 100 total bilirubin 1.2  High sensitive troponin 375 and 360  Lactic acid 2.2  Wbc 6.1 hgb 7.2 plt 121   Echocardiogram with reduced LV systolic function, with signs of hypoperfusion.  Placed on milrinone  for inotropic support.   09/19 off milrinone . Consulted structural heart team.  09/20 volume status has improved, plan for TEE and right heart catheterization during this admission.  09/22 limited echocardiogram with sever low flow gradient aortic stenosis.  09/23 CT cardiac and chest for part of TAVR workup. Resumed diuresis for volume overload.   Assessment and Plan: Acute on chronic systolic CHF (congestive heart failure) (HCC) Echocardiogram with reduced LV systolic function < 20%, global hypokinesis, RV with moderate reduction in systolic function, LA with severe dilatation, RA with moderate dilatation, severe mitral valve regurgitation, moderate to severe tricuspid valve regurgitation, low flow severe aortic valve stenosis with moderate regurgitation, moderate pulmonary valve regurgitation,   Urine output is 950 ml Systolic blood pressure 115 mmHg.  Sv02 61  09/19  off milrinone   Resume diuresis with furosemide  40 mg IV x1  Poor prognosis due to valvular heart disease.  Follow up with final recommendations from cardiology for valvular heart disease.   Congested hepatopathy die to volume overload.  AST and ALT are trending down.  Continue supportive medical care. No hepatic encephalopathy.   Essential hypertension Continue blood pressure monitoring   Chronic kidney disease, stage 3a (HCC) AKI, hyponatremia,   Renal function with serum cr at 1.68 with K at 5,0 and serum bicarbonate at 22  Na 130   Continue close monitoring renal function and electrolytes.  Holding diuretic for now.   Coronary artery disease Hold on aspirin  due to anemia., Continue with statin.  Elevation in high sensitive troponin due to heart failure, no acute coronary syndrome.   PAD (peripheral artery disease) Continue supportive medical care  Hyperlipidemia Continue statin therapy  Macrocytic anemia Thrombocytopenia.  Follow up hgb is 8.1 with plt 80 and wbc 6.6  Iron panel with serum iron at 146, TIBC 325, transferrin saturation 45 and ferritin 582   Possible anemia related to aortic stenosis.  Low platelet may suggest bone marrow failure.  Follow up cell count in am.   History of bladder cancer Follow up as outpatient.     Subjective: Patient with mild dyspnea today, with no chest pain, he had CT chest today   Physical Exam: Vitals:   03/31/24 2300 04/01/24 0329 04/01/24 0802 04/01/24 1136  BP: 124/71 127/72 133/83 116/68  Pulse: (!) 147 76 93 68  Resp: 16 18 19 20   Temp: 97.8 F (36.6 C) (!) 97.4 F (  36.3 C) (!) 97.4 F (36.3 C) (!) 97.5 F (36.4 C)  TempSrc: Oral Oral Oral Oral  SpO2: 100% 100% 98% 100%  Weight:  58.3 kg    Height:       Neurology awake and alert, deconditioned ENT with mild pallor with no icterus Cardiovascular with S1 and S2 present, and regular with no gallops, positive systolic murmur at the apex, and base.  Mild  JVD Respiratory with rales at bases with no wheezing or rhonchi  Abdomen with no distention  No lower extremity edema   Data Reviewed:    Family Communication: no family at the bedside   Disposition: Status is: Inpatient Remains inpatient appropriate because: valvular work up   Planned Discharge Destination: Home     Author: Elidia Toribio Furnace, MD 04/01/2024 3:38 PM  For on call review [email or web address].

## 2024-04-01 NOTE — Progress Notes (Addendum)
 Assisted Dr Claudene and Rexene Blush PA with thoracentesis.  Patient tolerated well.  PCXR done

## 2024-04-01 NOTE — Progress Notes (Signed)
 PROGRESS NOTE    Danny [name]  FMW:[id number] DOB: 1933-10-04 DOA: 03/25/2024 PCP: [name] LABOR, MD  89/M w systolic CHF, aortic stenosis, CAD, CKD, and bladder cancer sp TURP who presented with generalized weakness and fatigue.  In ED, vol overloaded, cr 2.2 , troponin 375 and 360 , Lactic acid 2.2 , hgb 7.2 plt 121  Echo with reduced EF,  with signs of hypoperfusion.  Placed on milrinone  for inotropic support.  -9/19 off milrinone . Consulted structural heart team.  -9/20 volume status has improved, plan for TEE and right heart catheterization during this admission.  -9/22 limited echocardiogram with sever low flow gradient aortic stenosis.  -9/23 CT cardiac and chest for part of TAVR workup. Resumed diuresis for volume overload.   Subjective:  Assessment and Plan:  Acute on chronic systolic CHF Severe MR, TR Severe LFLG Aortic stenosis -Echo w EF < 20%, global hypokinesis, RV with mod reduced, severe mitral valve regurgitation, moderate to severe tricuspid valve regurgitation, low flow severe aortic valve stenosis with moderate regurgitation, moderate pulmonary valve regurgitation,  - concern for low output on admission, Milrinone  9/19 off milrinone   Resume diuresis with furosemide  40 mg IV x1  Poor prognosis due to valvular heart disease.  Follow up with final recommendations from cardiology for valvular heart disease.   Abnormal LFTs Congested hepatopathy due to volume overload.   Essential hypertension As above  Chronic kidney disease, stage 3a (HCC) AKI, hyponatremia,  -improving  Coronary artery disease Hold on aspirin  due to anemia., Continue with statin.  Elevation in high sensitive troponin due to heart failure, no acute coronary syndrome.   PAD (peripheral artery disease) Continue supportive medical care  Hyperlipidemia Continue statin therapy  Macrocytic anemia Thrombocytopenia.  Follow up hgb is 8.1 with plt 80 and wbc 6.6  Iron panel with  serum iron at 146, TIBC 325, transferrin saturation 45 and ferritin 582   Possible anemia related to aortic stenosis.  Low platelet may suggest bone marrow failure.  Follow up cell count in am.   History of bladder cancer Follow up as outpatient.   DVT prophylaxis: SCDs Code Status: DNR Family Communication: Disposition Plan:   Consultants:    Procedures:   Antimicrobials:    Objective: Vitals:   04/01/24 0329 04/01/24 0802 04/01/24 1136 04/01/24 1615  BP: 127/72 133/83 116/68   Pulse: 76 93 68 100  Resp: 18 19 20    Temp: (!) 97.4 F (36.3 C) (!) 97.4 F (36.3 C) (!) 97.5 F (36.4 C) (!) 97.4 F (36.3 C)  TempSrc: Oral Oral Oral Oral  SpO2: 100% 98% 100% 94%  Weight: 58.3 kg     Height:        Intake/Output Summary (Last 24 hours) at 04/01/2024 1725 Last data filed at 04/01/2024 1711 Gross per 24 hour  Intake 580 ml  Output 1500 ml  Net -920 ml   Filed Weights   03/30/24 0503 03/31/24 0555 04/01/24 0329  Weight: 58.1 kg 58.3 kg 58.3 kg    Examination:      Data Reviewed:   CBC: Recent Labs  Lab 03/26/24 0404 03/27/24 0430 03/28/24 0500 03/29/24 0600 03/31/24 0602  WBC 5.1 5.8 4.9 4.8 6.6  HGB 8.2* 7.6* 7.2* 7.7* 8.1*  HCT 24.9* 22.5* 21.9* 22.8* 24.3*  MCV 106.0* 104.2* 104.3* 104.1* 105.7*  PLT 97* 82* 76* 71* 80*   Basic Metabolic Panel: Recent Labs  Lab 03/26/24 0404 03/27/24 0430 03/28/24 0500 03/29/24 0600 03/30/24 0500 03/31/24 0602 04/01/24 9349  NA 132* 134* 135 132* 131* 131* 130*  K 5.1 4.6 4.1 4.1 4.1 4.7 5.0  CL 104 98 98 98 101 101 98  CO2 18* 21* 21* 23 24 23 22   GLUCOSE 108* 138* 111* 109* 101* 108* 102*  BUN 74* 88* 87* 71* 56* 55* 58*  CREATININE 2.37* 2.68* 2.35* 1.86* 1.76* 1.75* 1.68*  CALCIUM  8.5* 8.4* 8.3* 8.2* 8.1* 8.2* 8.4*  MG 2.5* 2.3 2.1  --  1.9 1.9  --   PHOS 6.1*  --   --   --   --   --   --    GFR: Estimated Creatinine Clearance: 25.5 mL/min (A) (by C-G formula based on SCr of 1.68 mg/dL  (H)). Liver Function Tests: Recent Labs  Lab 03/25/24 1920 03/26/24 0404 03/27/24 0430 03/29/24 0600 04/01/24 0650  AST 205* 216* 200* 76* 38  ALT 207* 226* 258* 158* 86*  ALKPHOS 141* 121 159* 115 83  BILITOT 1.2 2.1* 1.8* 1.0 1.1  PROT 7.0 6.4* 6.0* 6.0* 5.9*  ALBUMIN  3.7 3.0* 2.9* 2.8* 2.6*   Recent Labs  Lab 03/25/24 1920  LIPASE 100*   No results for input(s): AMMONIA in the last 168 hours. Coagulation Profile: No results for input(s): INR, PROTIME in the last 168 hours. Cardiac Enzymes: No results for input(s): CKTOTAL, CKMB, CKMBINDEX, TROPONINI in the last 168 hours. BNP (last 3 results) No results for input(s): PROBNP in the last 8760 hours. HbA1C: No results for input(s): HGBA1C in the last 72 hours. CBG: Recent Labs  Lab 03/28/24 1046  GLUCAP 133*   Lipid Profile: No results for input(s): CHOL, HDL, LDLCALC, TRIG, CHOLHDL, LDLDIRECT in the last 72 hours. Thyroid  Function Tests: No results for input(s): TSH, T4TOTAL, FREET4, T3FREE, THYROIDAB in the last 72 hours. Anemia Panel: No results for input(s): VITAMINB12, FOLATE, FERRITIN, TIBC, IRON, RETICCTPCT in the last 72 hours. Urine analysis:    Component Value Date/Time   COLORURINE YELLOW 03/26/2024 1414   APPEARANCEUR CLEAR 03/26/2024 1414   LABSPEC 1.008 03/26/2024 1414   PHURINE 5.0 03/26/2024 1414   GLUCOSEU NEGATIVE 03/26/2024 1414   HGBUR SMALL (A) 03/26/2024 1414   BILIRUBINUR NEGATIVE 03/26/2024 1414   BILIRUBINUR large (A) 10/20/2022 1608   BILIRUBINUR negative 01/19/2022 0924   KETONESUR NEGATIVE 03/26/2024 1414   PROTEINUR NEGATIVE 03/26/2024 1414   UROBILINOGEN 4.0 (A) 10/20/2022 1608   UROBILINOGEN 0.2 11/28/2013 2032   NITRITE NEGATIVE 03/26/2024 1414   LEUKOCYTESUR MODERATE (A) 03/26/2024 1414   Sepsis Labs: @LABRCNTIP (procalcitonin:4,lacticidven:4)  ) Recent Results (from the past 240 hours)  Resp panel by RT-PCR (RSV, Flu  A&B, Covid) Anterior Nasal Swab     Status: None   Collection Time: 03/25/24  7:20 PM   Specimen: Anterior Nasal Swab  Result Value Ref Range Status   SARS Coronavirus 2 by RT PCR NEGATIVE NEGATIVE Final    Comment: (NOTE) SARS-CoV-2 target nucleic acids are NOT DETECTED.  The SARS-CoV-2 RNA is generally detectable in upper respiratory specimens during the acute phase of infection. The lowest concentration of SARS-CoV-2 viral copies this assay can detect is 138 copies/mL. A negative result does not preclude SARS-Cov-2 infection and should not be used as the sole basis for treatment or other patient management decisions. A negative result may occur with  improper specimen collection/handling, submission of specimen other than nasopharyngeal swab, presence of viral mutation(s) within the areas targeted by this assay, and inadequate number of viral copies(<138 copies/mL). A negative result must be combined with clinical observations,  patient history, and epidemiological information. The expected result is Negative.  Fact Sheet for Patients:  BloggerCourse.com  Fact Sheet for Healthcare Providers:  SeriousBroker.it  This test is no t yet approved or cleared by the [place]  FDA and  has been authorized for detection and/or diagnosis of SARS-CoV-2 by FDA under an Emergency Use Authorization (EUA). This EUA will remain  in effect (meaning this test can be used) for the duration of the COVID-19 declaration under Section 564(b)(1) of the Act, 21 U.S.C.section 360bbb-3(b)(1), unless the authorization is terminated  or revoked sooner.       Influenza A by PCR NEGATIVE NEGATIVE Final   Influenza B by PCR NEGATIVE NEGATIVE Final    Comment: (NOTE) The Xpert Xpress SARS-CoV-2/FLU/RSV plus assay is intended as an aid in the diagnosis of influenza from Nasopharyngeal swab specimens and should not be used as a sole basis for treatment.  Nasal washings and aspirates are unacceptable for Xpert Xpress SARS-CoV-2/FLU/RSV testing.  Fact Sheet for Patients: BloggerCourse.com  Fact Sheet for Healthcare Providers: SeriousBroker.it  This test is not yet approved or cleared by the [place]  FDA and has been authorized for detection and/or diagnosis of SARS-CoV-2 by FDA under an Emergency Use Authorization (EUA). This EUA will remain in effect (meaning this test can be used) for the duration of the COVID-19 declaration under Section 564(b)(1) of the Act, 21 U.S.C. section 360bbb-3(b)(1), unless the authorization is terminated or revoked.     Resp Syncytial Virus by PCR NEGATIVE NEGATIVE Final    Comment: (NOTE) Fact Sheet for Patients: BloggerCourse.com  Fact Sheet for Healthcare Providers: SeriousBroker.it  This test is not yet approved or cleared by the [place]  FDA and has been authorized for detection and/or diagnosis of SARS-CoV-2 by FDA under an Emergency Use Authorization (EUA). This EUA will remain in effect (meaning this test can be used) for the duration of the COVID-19 declaration under Section 564(b)(1) of the Act, 21 U.S.C. section 360bbb-3(b)(1), unless the authorization is terminated or revoked.  Performed at [facility], [address] [place], [place] [address]   Respiratory (~20 pathogens) panel by PCR     Status: None   Collection Time: 03/26/24  4:04 AM   Specimen: Nasopharyngeal Swab; Respiratory  Result Value Ref Range Status   Adenovirus NOT DETECTED NOT DETECTED Final   Coronavirus 229E NOT DETECTED NOT DETECTED Final    Comment: (NOTE) The Coronavirus on the Respiratory Panel, DOES NOT test for the novel  Coronavirus (2019 nCoV)    Coronavirus HKU1 NOT DETECTED NOT DETECTED Final   Coronavirus NL63 NOT DETECTED NOT DETECTED Final   Coronavirus OC43 NOT  DETECTED NOT DETECTED Final   Metapneumovirus NOT DETECTED NOT DETECTED Final   Rhinovirus / Enterovirus NOT DETECTED NOT DETECTED Final   Influenza A NOT DETECTED NOT DETECTED Final   Influenza B NOT DETECTED NOT DETECTED Final   Parainfluenza Virus 1 NOT DETECTED NOT DETECTED Final   Parainfluenza Virus 2 NOT DETECTED NOT DETECTED Final   Parainfluenza Virus 3 NOT DETECTED NOT DETECTED Final   Parainfluenza Virus 4 NOT DETECTED NOT DETECTED Final   Respiratory Syncytial Virus NOT DETECTED NOT DETECTED Final   Bordetella pertussis NOT DETECTED NOT DETECTED Final   Bordetella Parapertussis NOT DETECTED NOT DETECTED Final   Chlamydophila pneumoniae NOT DETECTED NOT DETECTED Final   Mycoplasma pneumoniae NOT DETECTED NOT DETECTED Final    Comment: Performed at [facility], [address], [place], [place] [address]  Radiology Studies: CT CORONARY MORPH W/CTA COR W/SCORE W/CA W/CM &/OR WO/CM Addendum Date: 04/01/2024 ADDENDUM REPORT: 04/01/2024 14:26 EXAM: OVER-READ INTERPRETATION  CT CHEST The following report is an over-read performed by radiologist Dr. Marcey Diones [facility] on 04/01/2024. This over-read does not include interpretation of cardiac or coronary anatomy or pathology. The coronary CTA interpretation by the cardiologist is attached. COMPARISON:  None. FINDINGS: The heart size is within normal limits. No pericardial fluid is identified. Visualized segments of the thoracic aorta and central pulmonary arteries are normal in caliber. Visualized mediastinum and hilar regions demonstrate no lymphadenopathy or masses. Pulmonary edema with moderate bilateral pleural effusions. Associated compressive atelectasis of both lower lobes. Visualized no pulmonary consolidation, pneumothorax or focal nodule. Visualized upper abdomen and bony structures are unremarkable. IMPRESSION: Pulmonary edema with moderate bilateral pleural effusions. Electronically Signed   By: Marcey Moan M.D.   On: 04/01/2024 14:26   Result Date: 04/01/2024 CLINICAL DATA:  Aortic valve replacement (TAVR), pre-op eval [id number] Severe aortic stenosis [id number] EXAM: Cardiac TAVR CT TECHNIQUE: The patient was scanned on a Siemens Force 192 slice scanner. A 120 kV retrospective scan was triggered in the descending thoracic aorta at 111 HU's. Gantry rotation speed was 270 msecs and collimation was .9 mm. The 3D data set was reconstructed in 5% intervals of the R-R cycle. Systolic and diastolic phases were analyzed on a dedicated work station using MPR, MIP and VRT modes. The patient received 100mL OMNIPAQUE  IOHEXOL  350 MG/ML SOLN of contrast. 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 Severely thickened and severely calcified aortic valve cusps. AV calcium  score: 1859 Virtual Basal Annulus Measurements: Maximum/Minimum Diameter: 27.5 x 20.2 mm Perimeter: 74.8 mm Area:  423 mm2 No significant LVOT calcifications. Membranous septal length: 8.4 mm Based on these measurements, the annulus would be suitable for a 23 mm Sapien 3 valve. Alternatively, Heart Team can consider 29 mm Evolut valve. Recommend Heart Team discussion for valve selection. Sinus of Valsalva Measurements: Non-coronary:  35 mm Right - coronary:  34 mm Left - coronary:  34 mm Coronary height and sinus of Valsalva Height: Left main: 22 mm, Left sinus: 26 mm Right coronary: 21 mm, Right sinus: 26 mm Aorta: Common origin of the brachiocephalic and left common carotid artery. Severe aortic atherosclerosis with very severe atheromatous plaque in the infrarenal abdominal aorta. Sinotubular Junction:  31 mm Ascending Thoracic Aorta:  37 mm Aortic Arch:  30 mm Descending Thoracic Aorta:  27 mm Coronary Arteries: Normal coronary origin. Right dominance. The study was performed without use of NTG and insufficient for plaque evaluation. Coronary artery calcium  score not performed due to prior CABG. RIMA-LAD patent.  LIMA-OM1 patent. SVG to OM2 appears occluded at the aortic anastomosis. Optimum Fluoroscopic Angle for Delivery: LAO 5 CAU 3 OTHER: Large bilateral pleural effusions. Atria: Biatrial dilation. Left atrial appendage: No thrombus. Mitral valve: Grossly normal, mild mitral annular calcifications. Pulmonary artery: Normal caliber. Pulmonary veins: Normal anatomy. IMPRESSION: 1. 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 Severely thickened and severely calcified aortic valve cusps. 2. Aortic valve calcium  score: 1859 3. Annulus area: 423 mm2, suitable for 23 mm Sapien 3 valve. No LVOT calcifications. Membranous septal length 8.4 mm. 4. Sufficient coronary artery heights from annulus. 5. Optimum fluoroscopic angle for delivery: LAO 5 CAU 3 6. Patent RIMA-LAD, LIMA-OM1.  Occluded SVG to OM2. 7. Severe aortic atherosclerosis with very severe atheromatous plaque in the infrarenal abdominal aorta. 8. Large bilateral pleural effusions.  Electronically Signed: By: Soyla Merck M.D. On: 04/01/2024 14:07   ECHOCARDIOGRAM LIMITED Result Date: 03/31/2024    TRANSESOPHOGEAL ECHO REPORT   Patient Name:   ADREAN HEITZ Date of Exam: 03/31/2024 Medical Rec #:  [id number]      Height:       64.0 in Accession #:    [id number]     Weight:       128.5 lb Date of Birth:  [birth year]     BSA:          1.621 m Patient Age:    89 years       BP:           123/66 mmHg Patient Gender: M              HR:           79 bpm. Exam Location:  Inpatient Procedure: Limited Echo, Color Doppler and Cardiac Doppler (Both Spectral and            Color Flow Doppler were utilized during procedure). Indications:    Aortic Stenosis  History:        Patient has prior history of Echocardiogram examinations, most                 recent 03/26/2024.  Sonographer:    Tinnie Gosling RDCS Referring Phys: [id number] GLENDIA DASEN WEAVER IMPRESSIONS  1. Left ventricular ejection fraction, by estimation, is 20%. The left ventricle has severely decreased function.  The left ventricle demonstrates global hypokinesis. The left ventricular internal cavity size was mildly dilated. No LV thrombus noted.  2. Right ventricular systolic function is mildly reduced. The right ventricular size is normal. Tricuspid regurgitation signal is inadequate for assessing PA pressure.  3. Left atrial size was mildly dilated.  4. The mitral valve is degenerative. Moderate mitral valve regurgitation. No evidence of mitral stenosis. Moderate mitral annular calcification.  5. The aortic valve is tricuspid. There is severe calcifcation of the aortic valve. Aortic valve regurgitation is mild. Aortic valve mean gradient measures 19.0 mmHg. Visually, I suspect low flow/low gradient severe aortic stenosis. However, no LVOT VTI  measurement was made, unable to calculate aortic valve area. Would suggest repeat study with fully interrogation of the aortic valve.  6. The inferior vena cava is normal in size with <50% respiratory variability, suggesting right atrial pressure of 8 mmHg.  7. Left pleural effusion present. FINDINGS  Left Ventricle: Left ventricular ejection fraction, by estimation, is 20%. The left ventricle has severely decreased function. The left ventricle demonstrates global hypokinesis. The left ventricular internal cavity size was mildly dilated. Right Ventricle: The right ventricular size is normal. No increase in right ventricular wall thickness. Right ventricular systolic function is mildly reduced. Tricuspid regurgitation signal is inadequate for assessing PA pressure. Left Atrium: Left atrial size was mildly dilated. Right Atrium: Right atrial size was normal in size. Pericardium: Left pleural effusion present. There is no evidence of pericardial effusion. Mitral Valve: The mitral valve is degenerative in appearance. There is mild calcification of the mitral valve leaflet(s). Moderate mitral annular calcification. Moderate mitral valve regurgitation. No evidence of mitral valve  stenosis. Tricuspid Valve: The tricuspid valve is not assessed. Aortic Valve: The aortic valve is tricuspid. There is severe calcifcation of the aortic valve. Aortic valve regurgitation is mild. Aortic valve mean gradient measures 19.0 mmHg. Aortic valve peak gradient measures 32.7 mmHg. Pulmonic Valve: The pulmonic valve was not assessed. Aorta: The aortic root is normal in  size and structure. Venous: The inferior vena cava is normal in size with less than 50% respiratory variability, suggesting right atrial pressure of 8 mmHg. IAS/Shunts: No atrial level shunt detected by color flow Doppler.  LEFT VENTRICLE PLAX 2D LVIDd:         5.30 cm LVIDs:         4.80 cm LV PW:         0.90 cm LV IVS:        0.90 cm LVOT diam:     2.10 cm LVOT Area:     3.46 cm  LV Volumes (MOD) LV vol d, MOD A2C: 162.0 ml LV vol d, MOD A4C: 160.0 ml LV vol s, MOD A2C: 132.0 ml LV vol s, MOD A4C: 124.0 ml LV SV MOD A2C:     30.0 ml LV SV MOD A4C:     160.0 ml LV SV MOD BP:      34.3 ml IVC IVC diam: 1.80 cm LEFT ATRIUM         Index LA diam:    4.10 cm 2.53 cm/m  AORTIC VALVE AV Vmax:      286.00 cm/s AV Vmean:     206.000 cm/s AV VTI:       0.549 m AV Peak Grad: 32.7 mmHg AV Mean Grad: 19.0 mmHg  AORTA Ao Root diam: 3.50 cm MITRAL VALVE MV Area (PHT): 6.12 cm    SHUNTS MV Decel Time: 124 msec    Systemic Diam: 2.10 cm MV E velocity: 92.00 cm/s MV A velocity: 70.40 cm/s MV E/A ratio:  1.31 Dalton McleanMD Electronically signed by Ezra Kanner Signature Date/Time: 03/31/2024/9:04:33 PM    Final      Scheduled Meds:  Chlorhexidine  Gluconate Cloth  6 each Topical Daily   Influenza vac split trivalent PF  0.5 mL Intramuscular Tomorrow-1000   rosuvastatin   10 mg Oral Daily   sodium chloride  flush  3 mL Intravenous Q12H   Continuous Infusions:   LOS: 6 days    Time spent:    Sigurd Pac, MD [facility]   04/01/2024, 5:25 PM

## 2024-04-01 NOTE — Plan of Care (Signed)

## 2024-04-01 NOTE — Procedures (Signed)
 Thoracentesis  Procedure Note  Danny [name]  [id number]  04-Sep-1933  Date:04/01/24  Time:6:00 PM   Provider Performing:Jakeria Caissie C Claudene   Procedure: Thoracentesis with imaging guidance (67444)  Indication(s) Pleural Effusion  Consent Risks of the procedure as well as the alternatives and risks of each were explained to the patient and/or caregiver.  Consent for the procedure was obtained and is signed in the bedside chart  Anesthesia Topical only with 1% lidocaine     Time Out Verified patient identification, verified procedure, site/side was marked, verified correct patient position, special equipment/implants available, medications/allergies/relevant history reviewed, required imaging and test results available.   Sterile Technique Maximal sterile technique including full sterile barrier drape, hand hygiene, sterile gown, sterile gloves, mask, hair covering, sterile ultrasound probe cover (if used).  Procedure Description Ultrasound was used to identify appropriate pleural anatomy for placement and overlying skin marked.  Area of drainage cleaned and draped in sterile fashion. Lidocaine  was used to anesthetize the skin and subcutaneous tissue.  1400 cc's of straw appearing fluid was drained from the right pleural space. Catheter then removed and bandaid applied to site.   Complications/Tolerance None; patient tolerated the procedure well. Chest X-ray is ordered to confirm no post-procedural complication.   EBL Minimal   Specimen(s) None

## 2024-04-02 ENCOUNTER — Inpatient Hospital Stay (HOSPITAL_COMMUNITY)

## 2024-04-02 DIAGNOSIS — Z7189 Other specified counseling: Secondary | ICD-10-CM | POA: Diagnosis not present

## 2024-04-02 DIAGNOSIS — I350 Nonrheumatic aortic (valve) stenosis: Secondary | ICD-10-CM | POA: Diagnosis not present

## 2024-04-02 DIAGNOSIS — Z789 Other specified health status: Secondary | ICD-10-CM | POA: Diagnosis not present

## 2024-04-02 DIAGNOSIS — I5023 Acute on chronic systolic (congestive) heart failure: Secondary | ICD-10-CM | POA: Diagnosis not present

## 2024-04-02 DIAGNOSIS — J90 Pleural effusion, not elsewhere classified: Secondary | ICD-10-CM

## 2024-04-02 DIAGNOSIS — Z515 Encounter for palliative care: Secondary | ICD-10-CM | POA: Diagnosis not present

## 2024-04-02 DIAGNOSIS — Z66 Do not resuscitate: Secondary | ICD-10-CM | POA: Diagnosis not present

## 2024-04-02 LAB — BASIC METABOLIC PANEL WITH GFR
Anion gap: 8 (ref 5–15)
BUN: 60 mg/dL — ABNORMAL HIGH (ref 8–23)
CO2: 24 mmol/L (ref 22–32)
Calcium: 8.2 mg/dL — ABNORMAL LOW (ref 8.9–10.3)
Chloride: 99 mmol/L (ref 98–111)
Creatinine, Ser: 1.92 mg/dL — ABNORMAL HIGH (ref 0.61–1.24)
GFR, Estimated: 33 mL/min — ABNORMAL LOW (ref >60)
Glucose, Bld: 125 mg/dL — ABNORMAL HIGH (ref 70–99)
Potassium: 4.2 mmol/L (ref 3.5–5.1)
Sodium: 131 mmol/L — ABNORMAL LOW (ref 135–145)

## 2024-04-02 LAB — COOXEMETRY PANEL
Carboxyhemoglobin: 2.5 % — ABNORMAL HIGH (ref 0.5–1.5)
Methemoglobin: 0.7 % (ref 0.0–1.5)
O2 Saturation: 65.9 %
Total hemoglobin: 8.8 g/dL — ABNORMAL LOW (ref 12.0–16.0)

## 2024-04-02 LAB — VITAMIN B12: Vitamin B-12: 662 pg/mL (ref 180–914)

## 2024-04-02 LAB — MAGNESIUM: Magnesium: 1.8 mg/dL (ref 1.7–2.4)

## 2024-04-02 NOTE — Progress Notes (Signed)
 Advanced Heart Failure Rounding Note  Cardiologist: None  Chief Complaint: Heart Failure/AS Subjective:   9/17- Diuresed with IV lasix .   Diuresed yesterday with IV lasix . -2.5L UOP. Weight down 4lbs. CVP <5  S/p -1.3L on the L and -1.4L on the R Thoracentesis 04/01/24  Feels much better this morning. Ready to get up and walk.  Objective:   Weight Range: 56.3 kg Body mass index is 21.3 kg/m.   Vital Signs:   Temp:  [97.4 F (36.3 C)-97.8 F (36.6 C)] 97.8 F (36.6 C) (09/24 0729) Pulse Rate:  [68-133] 81 (09/24 0729) Resp:  [11-26] 18 (09/24 0729) BP: (83-122)/(49-71) 100/64 (09/24 0729) SpO2:  [94 %-100 %] 96 % (09/24 0729) Weight:  [56.3 kg] 56.3 kg (09/24 0331) Last BM Date : 03/30/24 (per pt)  Weight change: Filed Weights   03/31/24 0555 04/01/24 0329 04/02/24 0331  Weight: 58.3 kg 58.3 kg 56.3 kg    Intake/Output:   Intake/Output Summary (Last 24 hours) at 04/02/2024 0921 Last data filed at 04/02/2024 0800 Gross per 24 hour  Intake 340 ml  Output 2500 ml  Net -2160 ml  CVP <5  Physical Exam  General:  elderly appearing.   Neck: JVD ~6 cm.  Cor: Regular rate & rhythm. 3/6 RUSB murmur. Lungs: clear, diminished bases Extremities: no edema. PICC RUE Neuro: alert & oriented x 3. Affect pleasant.   Telemetry  A fib 70s 1-3 PVCs/hr (Personally reviewed)    Labs    CBC Recent Labs    03/31/24 0602  WBC 6.6  HGB 8.1*  HCT 24.3*  MCV 105.7*  PLT 80*   Basic Metabolic Panel Recent Labs    90/77/74 0602 04/01/24 0650 04/02/24 0500  NA 131* 130* 131*  K 4.7 5.0 4.2  CL 101 98 99  CO2 23 22 24   GLUCOSE 108* 102* 125*  BUN 55* 58* 60*  CREATININE 1.75* 1.68* 1.92*  CALCIUM  8.2* 8.4* 8.2*  MG 1.9  --  1.8   Liver Function Tests Recent Labs    04/01/24 0650  AST 38  ALT 86*  ALKPHOS 83  BILITOT 1.1  PROT 5.9*  ALBUMIN  2.6*    No results for input(s): LIPASE, AMYLASE in the last 72 hours.  Cardiac Enzymes No results for  input(s): CKTOTAL, CKMB, CKMBINDEX, TROPONINI in the last 72 hours.  BNP: BNP (last 3 results) Recent Labs    03/26/24 0404  BNP >4,500.0*    ProBNP (last 3 results) No results for input(s): PROBNP in the last 8760 hours.   D-Dimer No results for input(s): DDIMER in the last 72 hours. Hemoglobin A1C No results for input(s): HGBA1C in the last 72 hours. Fasting Lipid Panel No results for input(s): CHOL, HDL, LDLCALC, TRIG, CHOLHDL, LDLDIRECT in the last 72 hours. Thyroid  Function Tests No results for input(s): TSH, T4TOTAL, T3FREE, THYROIDAB in the last 72 hours.  Invalid input(s): FREET3  Other results:  Imaging   CT ANGIO CHEST AORTA W/CM & OR WO/CM Result Date: 04/01/2024 CLINICAL DATA:  Preoperative evaluation for aortic valve replacement. History of bladder cancer. * Tracking Code: BO * EXAM: CT ANGIOGRAPHY CHEST, ABDOMEN AND PELVIS TECHNIQUE: Multidetector CT imaging through the chest, abdomen and pelvis was performed using the standard protocol during bolus administration of intravenous contrast. Multiplanar reconstructed images and MIPs were obtained and reviewed to evaluate the vascular anatomy. RADIATION DOSE REDUCTION: This exam was performed according to the departmental dose-optimization program which includes automated exposure control, adjustment of the  mA and/or kV according to patient size and/or use of iterative reconstruction technique. CONTRAST:  OMNIPAQUE  IOHEXOL  350 MG/ML SOLN COMPARISON:  CT abdomen and pelvis dated 10/19/2022 FINDINGS: CTA CHEST FINDINGS Cardiovascular: Right upper extremity PICC tip terminates in the lower SVC. Preferential opacification of the thoracic aorta. No evidence of thoracic aortic aneurysm or dissection. Irregular noncalcified atherosclerotic plaque without substantial luminal narrowing along the descending thoracic aorta. No pericardial effusion. Coronary artery calcifications. Reflux of  contrast material into the hepatic veins, suggesting a degree of right heart dysfunction. Please see separately dictated cardiac CT report for detailed findings. Mediastinum/Nodes: Imaged thyroid  gland without nodules meeting criteria for imaging follow-up by size. Normal esophagus. No pathologically enlarged axillary, supraclavicular, mediastinal, or hilar lymph nodes. Lungs/Pleura: The central airways are patent. Mild interlobular septal thickening with interspersed areas of ground-glass opacities in the bilateral upper lobes. Near-complete relaxation atelectasis of the bilateral lower lobes. No pneumothorax. Pleural effusions. Musculoskeletal: Median sternotomy wires are nondisplaced. No acute or abnormal lytic or blastic osseous lesions. Review of the MIP images confirms the above findings. CTA ABDOMEN AND PELVIS FINDINGS VASCULAR Aorta: Aortic atherosclerosis. Irregular noncalcified atherosclerotic plaque results in moderate luminal narrowing of the juxtarenal and infrarenal abdominal aorta. No aneurysm or dissection. Celiac: Patent without evidence of aneurysm, dissection, vasculitis or significant stenosis. Segmental mild to moderate luminal narrowing of the splenic artery due to calcified plaque. SMA: Segmental mild-to-moderate luminal narrowing of the SMA due to atherosclerotic plaque. Replaced hepatic artery arises from the SMA. Renals: Single bilateral renal arteries. Mild luminal narrowing of the proximal arteries due to atherosclerotic plaque. IMA: Severe luminal narrowing of the IMA origin. Inflow: Patent without evidence of aneurysm, dissection, vasculitis or significant stenosis. Proximal Outflow: Bilateral common femoral and visualized portions of the superficial and profunda femoral arteries are patent without evidence of aneurysm, dissection, vasculitis or significant stenosis. Veins: No obvious venous abnormality within the limitations of this arterial phase study. Review of the MIP images  confirms the above findings. NON-VASCULAR Hepatobiliary: No focal hepatic lesions. No intra or extrahepatic biliary ductal dilation. Normal gallbladder. Pancreas: No focal lesions or main ductal dilation. Spleen: Normal in size without focal abnormality. Adrenals/Urinary Tract: No adrenal nodules. Again seen are multiple bilateral renal cysts, including peripherally calcified irregular structure arising exophytically from the lower pole left kidney with an adjacent intermediate attenuation cyst (6:132). Some of the cysts are simple while others are hemorrhagic/proteinaceous. No hydronephrosis or calculi. Underdistended urinary bladder contains anti dependent intraluminal gas as well as multiple scattered punctate foci of gas along the posterior urinary bladder. There is irregular mural thickening along the right bladder wall with a small nodular focus protruding into the bladder lumen measuring 7 mm (6:177). A mass in this area previously measured 2.1 cm. Stomach/Bowel: Normal appearance of the stomach. No evidence of bowel wall thickening, distention, or inflammatory changes. Appendix is not discretely seen. Lymphatic: No enlarged abdominal or pelvic lymph nodes. Reproductive: Prostate is unremarkable. Other: Small volume presacral free fluid. No free air or fluid collection. Musculoskeletal: No acute or abnormal lytic or blastic osseous lesions. Diffuse body wall edema. Review of the MIP images confirms the above findings. IMPRESSION: 1. Irregular noncalcified atherosclerotic plaque results in moderate luminal narrowing of the juxtarenal and infrarenal abdominal aorta. No aneurysm or dissection. 2. Mild interlobular septal thickening with interspersed areas of ground-glass opacities in the bilateral upper lobes, likely pulmonary edema. 3. Bilateral large pleural effusions with near-complete relaxation atelectasis of the bilateral lower lobes. 4. Irregular mural thickening  along the right bladder wall with a small  nodular focus protruding into the bladder lumen measuring 7 mm. A mass in this area previously measured 2.1 cm. Findings are consistent with known bladder cancer. 5. Intraluminal gas within the urinary bladder including scattered punctate foci along the posterior bladder wall. 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 6. Multiple bilateral renal cysts, some of which are simple while others are hemorrhagic/proteinaceous. These are not substantially changed in size compared to 10/19/2022 and better evaluated on prior CT dated 11/29/2022. 7. Reflux of contrast material into the hepatic veins, suggesting a degree of right heart dysfunction. 8.  Aortic Atherosclerosis (ICD10-I70.0). Electronically Signed   By: Limin  Xu M.D.   On: 04/01/2024 18:28   CT Angio Abd/Pel w/ and/or w/o Result Date: 04/01/2024 CLINICAL DATA:  Preoperative evaluation for aortic valve replacement. History of bladder cancer. * Tracking Code: BO * EXAM: CT ANGIOGRAPHY CHEST, ABDOMEN AND PELVIS TECHNIQUE: Multidetector CT imaging through the chest, abdomen and pelvis was performed using the standard protocol during bolus administration of intravenous contrast. Multiplanar reconstructed images and MIPs were obtained and reviewed to evaluate the vascular anatomy. RADIATION DOSE REDUCTION: This exam was performed according to the departmental dose-optimization program which includes automated exposure control, adjustment of the mA and/or kV according to patient size and/or use of iterative reconstruction technique. CONTRAST:  OMNIPAQUE  IOHEXOL  350 MG/ML SOLN COMPARISON:  CT abdomen and pelvis dated 10/19/2022 FINDINGS: CTA CHEST FINDINGS Cardiovascular: Right upper extremity PICC tip terminates in the lower SVC. Preferential opacification of the thoracic aorta. No evidence of thoracic aortic aneurysm or dissection. Irregular noncalcified  atherosclerotic plaque without substantial luminal narrowing along the descending thoracic aorta. No pericardial effusion. Coronary artery calcifications. Reflux of contrast material into the hepatic veins, suggesting a degree of right heart dysfunction. Please see separately dictated cardiac CT report for detailed findings. Mediastinum/Nodes: Imaged thyroid  gland without nodules meeting criteria for imaging follow-up by size. Normal esophagus. No pathologically enlarged axillary, supraclavicular, mediastinal, or hilar lymph nodes. Lungs/Pleura: The central airways are patent. Mild interlobular septal thickening with interspersed areas of ground-glass opacities in the bilateral upper lobes. Near-complete relaxation atelectasis of the bilateral lower lobes. No pneumothorax. Pleural effusions. Musculoskeletal: Median sternotomy wires are nondisplaced. No acute or abnormal lytic or blastic osseous lesions. Review of the MIP images confirms the above findings. CTA ABDOMEN AND PELVIS FINDINGS VASCULAR Aorta: Aortic atherosclerosis. Irregular noncalcified atherosclerotic plaque results in moderate luminal narrowing of the juxtarenal and infrarenal abdominal aorta. No aneurysm or dissection. Celiac: Patent without evidence of aneurysm, dissection, vasculitis or significant stenosis. Segmental mild to moderate luminal narrowing of the splenic artery due to calcified plaque. SMA: Segmental mild-to-moderate luminal narrowing of the SMA due to atherosclerotic plaque. Replaced hepatic artery arises from the SMA. Renals: Single bilateral renal arteries. Mild luminal narrowing of the proximal arteries due to atherosclerotic plaque. IMA: Severe luminal narrowing of the IMA origin. Inflow: Patent without evidence of aneurysm, dissection, vasculitis or significant stenosis. Proximal Outflow: Bilateral common femoral and visualized portions of the superficial and profunda femoral arteries are patent without evidence of aneurysm,  dissection, vasculitis or significant stenosis. Veins: No obvious venous abnormality within the limitations of this arterial phase study. Review of the MIP images confirms the above findings. NON-VASCULAR Hepatobiliary: No focal hepatic lesions. No intra or extrahepatic biliary ductal dilation. Normal gallbladder. Pancreas: No focal lesions or main ductal dilation. Spleen: Normal in size without focal  abnormality. Adrenals/Urinary Tract: No adrenal nodules. Again seen are multiple bilateral renal cysts, including peripherally calcified irregular structure arising exophytically from the lower pole left kidney with an adjacent intermediate attenuation cyst (6:132). Some of the cysts are simple while others are hemorrhagic/proteinaceous. No hydronephrosis or calculi. Underdistended urinary bladder contains anti dependent intraluminal gas as well as multiple scattered punctate foci of gas along the posterior urinary bladder. There is irregular mural thickening along the right bladder wall with a small nodular focus protruding into the bladder lumen measuring 7 mm (6:177). A mass in this area previously measured 2.1 cm. Stomach/Bowel: Normal appearance of the stomach. No evidence of bowel wall thickening, distention, or inflammatory changes. Appendix is not discretely seen. Lymphatic: No enlarged abdominal or pelvic lymph nodes. Reproductive: Prostate is unremarkable. Other: Small volume presacral free fluid. No free air or fluid collection. Musculoskeletal: No acute or abnormal lytic or blastic osseous lesions. Diffuse body wall edema. Review of the MIP images confirms the above findings. IMPRESSION: 1. Irregular noncalcified atherosclerotic plaque results in moderate luminal narrowing of the juxtarenal and infrarenal abdominal aorta. No aneurysm or dissection. 2. Mild interlobular septal thickening with interspersed areas of ground-glass opacities in the bilateral upper lobes, likely pulmonary edema. 3. Bilateral large  pleural effusions with near-complete relaxation atelectasis of the bilateral lower lobes. 4. Irregular mural thickening along the right bladder wall with a small nodular focus protruding into the bladder lumen measuring 7 mm. A mass in this area previously measured 2.1 cm. Findings are consistent with known bladder cancer. 5. Intraluminal gas within the urinary bladder including scattered punctate foci along the posterior bladder wall. 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 6. Multiple bilateral renal cysts, some of which are simple while others are hemorrhagic/proteinaceous. These are not substantially changed in size compared to 10/19/2022 and better evaluated on prior CT dated 11/29/2022. 7. Reflux of contrast material into the hepatic veins, suggesting a degree of right heart dysfunction. 8.  Aortic Atherosclerosis (ICD10-I70.0). Electronically Signed   By: Limin  Xu M.D.   On: 04/01/2024 18:28   DG CHEST PORT 1 VIEW Result Date: 04/01/2024 CLINICAL DATA:  Pleural effusion.  Post bilateral thoracentesis. EXAM: PORTABLE CHEST 1 VIEW COMPARISON:  Chest radiograph 03/25/2024.  CT chest 04/01/2024 FINDINGS: Postoperative changes in the mediastinum. Mild cardiac enlargement. No vascular congestion or edema. Minimal bilateral or pleural effusions representing interval decreased since prior CT post thoracentesis. No pneumothorax. Mediastinal contours appear intact. A right PICC line is in place with tip projecting over the low SVC region. Calcification in the aorta. Probable emphysematous changes in the lungs. Peribronchial thickening with perihilar infiltrates likely representing chronic bronchitis. IMPRESSION: 1. Minimal if any residual pleural effusions post thoracentesis. No pneumothorax. 2. Mild cardiac enlargement. 3. 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 Electronically Signed   By: Elsie Gravely  M.D.   On: 04/01/2024 18:17   CT CORONARY MORPH W/CTA COR W/SCORE W/CA W/CM &/OR WO/CM Addendum Date: 04/01/2024 ADDENDUM REPORT: 04/01/2024 14:26 EXAM: OVER-READ INTERPRETATION  CT CHEST The following report is an over-read performed by radiologist Dr. Marcey Diones [facility] on 04/01/2024. This over-read does not include interpretation of cardiac or coronary anatomy or pathology. The coronary CTA interpretation by the cardiologist is attached. COMPARISON:  None. FINDINGS: The heart size is within normal limits. No pericardial fluid is identified. Visualized segments of the thoracic aorta and central pulmonary arteries are normal in caliber. Visualized mediastinum  and hilar regions demonstrate no lymphadenopathy or masses. Pulmonary edema with moderate bilateral pleural effusions. Associated compressive atelectasis of both lower lobes. Visualized no pulmonary consolidation, pneumothorax or focal nodule. Visualized upper abdomen and bony structures are unremarkable. IMPRESSION: Pulmonary edema with moderate bilateral pleural effusions. Electronically Signed   By: Marcey Moan M.D.   On: 04/01/2024 14:26   Result Date: 04/01/2024 CLINICAL DATA:  Aortic valve replacement (TAVR), pre-op eval [id number] Severe aortic stenosis [id number] EXAM: Cardiac TAVR CT TECHNIQUE: The patient was scanned on a Siemens Force 192 slice scanner. A 120 kV retrospective scan was triggered in the descending thoracic aorta at 111 HU's. Gantry rotation speed was 270 msecs and collimation was .9 mm. The 3D data set was reconstructed in 5% intervals of the R-R cycle. Systolic and diastolic phases were analyzed on a dedicated work station using MPR, MIP and VRT modes. The patient received 100mL OMNIPAQUE  IOHEXOL  350 MG/ML SOLN of contrast. 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 Severely thickened and severely calcified aortic valve cusps. AV calcium  score: 1859 Virtual Basal  Annulus Measurements: Maximum/Minimum Diameter: 27.5 x 20.2 mm Perimeter: 74.8 mm Area:  423 mm2 No significant LVOT calcifications. Membranous septal length: 8.4 mm Based on these measurements, the annulus would be suitable for a 23 mm Sapien 3 valve. Alternatively, Heart Team can consider 29 mm Evolut valve. Recommend Heart Team discussion for valve selection. Sinus of Valsalva Measurements: Non-coronary:  35 mm Right - coronary:  34 mm Left - coronary:  34 mm Coronary height and sinus of Valsalva Height: Left main: 22 mm, Left sinus: 26 mm Right coronary: 21 mm, Right sinus: 26 mm Aorta: Common origin of the brachiocephalic and left common carotid artery. Severe aortic atherosclerosis with very severe atheromatous plaque in the infrarenal abdominal aorta. Sinotubular Junction:  31 mm Ascending Thoracic Aorta:  37 mm Aortic Arch:  30 mm Descending Thoracic Aorta:  27 mm Coronary Arteries: Normal coronary origin. Right dominance. The study was performed without use of NTG and insufficient for plaque evaluation. Coronary artery calcium  score not performed due to prior CABG. RIMA-LAD patent. LIMA-OM1 patent. SVG to OM2 appears occluded at the aortic anastomosis. Optimum Fluoroscopic Angle for Delivery: LAO 5 CAU 3 OTHER: Large bilateral pleural effusions. Atria: Biatrial dilation. Left atrial appendage: No thrombus. Mitral valve: Grossly normal, mild mitral annular calcifications. Pulmonary artery: Normal caliber. Pulmonary veins: Normal anatomy. IMPRESSION: 1. 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 Severely thickened and severely calcified aortic valve cusps. 2. Aortic valve calcium  score: 1859 3. Annulus area: 423 mm2, suitable for 23 mm Sapien 3 valve. No LVOT calcifications. Membranous septal length 8.4 mm. 4. Sufficient coronary artery heights from annulus. 5. Optimum fluoroscopic angle for delivery: LAO 5 CAU 3 6. Patent RIMA-LAD, LIMA-OM1.  Occluded SVG to OM2. 7. Severe aortic  atherosclerosis with very severe atheromatous plaque in the infrarenal abdominal aorta. 8. Large bilateral pleural effusions. Electronically Signed: By: Soyla Merck M.D. On: 04/01/2024 14:07    Medications:   Scheduled Medications:  Chlorhexidine  Gluconate Cloth  6 each Topical Daily   Influenza vac split trivalent PF  0.5 mL Intramuscular Tomorrow-1000   rosuvastatin   10 mg Oral Daily   sodium chloride  flush  3 mL Intravenous Q12H    Infusions:    PRN Medications: acetaminophen , guaiFENesin -dextromethorphan , lip balm, mouth rinse, senna, sodium chloride  flush, trimethobenzamide   Patient Profile  89 y.o. male with history of CAD w/ prior NSTEMI in 2023 s/p CABG, AS,  ischemic CM with prior recovery in EF, CKD IIIa, hx bradycardia, bladder cancer s/p TURPT, chronic anemia.   Admitted with acute systolic heart failure/cardiogenic shock in setting of severe AS and acute on chronic anemia.  Assessment/Plan  Acute systolic CHF>cardiogenic shock -EF 50-55% in 06/25 w/ reported moderate AS -Echo this admit: EF 15-20% w/ severe AS on preliminary review - Limited echo 03/31/24 EF 20%, no LV thrombus, LV with GHK, RV mildly reduced, LA mildly dilated, mod MR, AV mean gradient 19 mmHg, severe aortic stenosis -Lactic acid 2.2>2.3>1.9 -Etiology for drop in EF not certain. Could be d/t AS. Also has LBBB w/ wide QRS.  - Stable off Milrinone . CO-OX remains stable.  - GDMT limited with shock/AKI - CVP <5. Hold further diuresis.  - GDMT limited by AKI - S/p -1.3L on the L and -1.4L on the R Thoracentesis 04/01/24   Severe AS - Noted on echo this admit - Not certain if he would be a candidate for TAVR if he recovers from shock - Plan for ongoing discussion with structural heart team.  - May need RHC pending TTE findings. (Will cancel TEE)   CAD Elevated troponin -NSTEMI 2023 s/p CABG X 3 -HS troponin 484 -Suspect demand ischemia -Statin on hold d/t shock liver. No aspirin  with  significant anemia.   Acute on chronic macrocytic anemia -Hbgb previously 9s-10s, 8.3 in April. 7.2 on admit s/p 1 u RBCs -FOBT X 1 negative.  - Hgb 8.1 03/31/24 -? AVMs in setting of severe AS   AKI on CKD IIIa -Baseline Scr 1.2-1.3, peaked 2.7 in setting of shock, 1.7 today.  Elevated LFTs -Suspect shock liver -LFTs trending down  -US  abdomen with biliary sludge and gallbladder wall thickening    GOC: Palliative Care consulted. Now DNR.   Length of Stay: 7  Beckey LITTIE Coe, NP  04/02/2024, 9:21 AM  Advanced Heart Failure Team Pager [phone] (M-F; 7a - 5p)  Please contact [facility] for night-coverage after hours (5p -7a ) and weekends on [email or web address]

## 2024-04-02 NOTE — Progress Notes (Addendum)
 Rounding Note   Patient Name: Danny [name] Date of Encounter: 04/02/2024  [facility] Cardiologist: None   Subjective No acute events overnight.  Patient feels much improved after continued diuresis and bilateral thoracentesis.  Able to ambulate the halls with mild dyspnea  Scheduled Meds:  Chlorhexidine  Gluconate Cloth  6 each Topical Daily   Influenza vac split trivalent PF  0.5 mL Intramuscular Tomorrow-1000   rosuvastatin   10 mg Oral Daily   sodium chloride  flush  3 mL Intravenous Q12H   Continuous Infusions:  PRN Meds: acetaminophen , guaiFENesin -dextromethorphan , lip balm, mouth rinse, senna, sodium chloride  flush, trimethobenzamide    Vital Signs  Vitals:   04/02/24 0729 04/02/24 1151 04/02/24 1521 04/02/24 1553  BP: 100/64 101/65 (!) 97/56   Pulse: 81 (!) 133 80 81  Resp: 18 15 14 15   Temp: 97.8 F (36.6 C) 97.6 F (36.4 C) (!) 97.4 F (36.3 C) (!) (P) 97.5 F (36.4 C)  TempSrc: Oral Oral Oral (P) Oral  SpO2: 96% 100% 100%   Weight:      Height:        Intake/Output Summary (Last 24 hours) at 04/02/2024 1714 Last data filed at 04/02/2024 0800 Gross per 24 hour  Intake 240 ml  Output 1450 ml  Net -1210 ml      04/02/2024    3:31 AM 04/01/2024    3:29 AM 03/31/2024    5:55 AM  Last 3 Weights  Weight (lbs) 124 lb 1.9 oz 128 lb 8.5 oz 128 lb 8.5 oz  Weight (kg) 56.3 kg 58.3 kg 58.3 kg      Telemetry Sinus rhythm, left bundle branch block- Personally Reviewed  ECG  N/a  Physical Exam  GEN: No acute distress.  Frail Neck: No JVD Cardiac: RRR, 3 out of 6 systolic murmur Respiratory: Mildly decreased breath sounds at bases GI: Soft, nontender, non-distended  MS: No edema; No deformity.  Warm and well-perfused Neuro:  Nonfocal  Psych: Normal affect   Labs High Sensitivity Troponin:   Recent Labs  Lab 03/26/24 0404  TROPONINIHS 484*     Chemistry Recent Labs  Lab 03/27/24 0430 03/28/24 0500 03/29/24 0600 03/30/24 0500  03/31/24 0602 04/01/24 0650 04/02/24 0500  NA 134*   < > 132* 131* 131* 130* 131*  K 4.6   < > 4.1 4.1 4.7 5.0 4.2  CL 98   < > 98 101 101 98 99  CO2 21*   < > 23 24 23 22 24   GLUCOSE 138*   < > 109* 101* 108* 102* 125*  BUN 88*   < > 71* 56* 55* 58* 60*  CREATININE 2.68*   < > 1.86* 1.76* 1.75* 1.68* 1.92*  CALCIUM  8.4*   < > 8.2* 8.1* 8.2* 8.4* 8.2*  MG 2.3   < >  --  1.9 1.9  --  1.8  PROT 6.0*  --  6.0*  --   --  5.9*  --   ALBUMIN  2.9*  --  2.8*  --   --  2.6*  --   AST 200*  --  76*  --   --  38  --   ALT 258*  --  158*  --   --  86*  --   ALKPHOS 159*  --  115  --   --  83  --   BILITOT 1.8*  --  1.0  --   --  1.1  --   GFRNONAA 22*   < >  35* 37* 37* 39* 33*  ANIONGAP 15   < > 11 6 7 10 8    < > = values in this interval not displayed.    Lipids No results for input(s): CHOL, TRIG, HDL, LABVLDL, LDLCALC, CHOLHDL in the last 168 hours.  Hematology Recent Labs  Lab 03/28/24 0500 03/29/24 0600 03/31/24 0602  WBC 4.9 4.8 6.6  RBC 2.10* 2.19* 2.30*  HGB 7.2* 7.7* 8.1*  HCT 21.9* 22.8* 24.3*  MCV 104.3* 104.1* 105.7*  MCH 34.3* 35.2* 35.2*  MCHC 32.9 33.8 33.3  RDW 20.2* 19.8* 20.2*  PLT 76* 71* 80*   Thyroid  No results for input(s): TSH, FREET4 in the last 168 hours.  BNPNo results for input(s): BNP, PROBNP in the last 168 hours.  DDimer No results for input(s): DDIMER in the last 168 hours.   Radiology  DG CHEST PORT 1 VIEW Result Date: 04/02/2024 CLINICAL DATA:  Bilateral pleural effusions. EXAM: PORTABLE CHEST 1 VIEW COMPARISON:  Radiograph and CT yesterday FINDINGS: Stable right upper extremity PICC tip in the SVC. Unchanged cardiomegaly post median sternotomy and CABG. Trace bilateral pleural effusions. Scattered atelectasis. Improving ground-glass opacities in the upper lung zones. No pneumothorax. IMPRESSION: 1. Trace bilateral pleural effusions. 2. Improving ground-glass opacities in the upper lung zones. Electronically Signed   By: Andrea Gasman M.D.   On: 04/02/2024 12:50   CT ANGIO CHEST AORTA W/CM & OR WO/CM Result Date: 04/01/2024 CLINICAL DATA:  Preoperative evaluation for aortic valve replacement. History of bladder cancer. * Tracking Code: BO * EXAM: CT ANGIOGRAPHY CHEST, ABDOMEN AND PELVIS TECHNIQUE: Multidetector CT imaging through the chest, abdomen and pelvis was performed using the standard protocol during bolus administration of intravenous contrast. Multiplanar reconstructed images and MIPs were obtained and reviewed to evaluate the vascular anatomy. RADIATION DOSE REDUCTION: This exam was performed according to the departmental dose-optimization program which includes automated exposure control, adjustment of the mA and/or kV according to patient size and/or use of iterative reconstruction technique. CONTRAST:  OMNIPAQUE  IOHEXOL  350 MG/ML SOLN COMPARISON:  CT abdomen and pelvis dated 10/19/2022 FINDINGS: CTA CHEST FINDINGS Cardiovascular: Right upper extremity PICC tip terminates in the lower SVC. Preferential opacification of the thoracic aorta. No evidence of thoracic aortic aneurysm or dissection. Irregular noncalcified atherosclerotic plaque without substantial luminal narrowing along the descending thoracic aorta. No pericardial effusion. Coronary artery calcifications. Reflux of contrast material into the hepatic veins, suggesting a degree of right heart dysfunction. Please see separately dictated cardiac CT report for detailed findings. Mediastinum/Nodes: Imaged thyroid  gland without nodules meeting criteria for imaging follow-up by size. Normal esophagus. No pathologically enlarged axillary, supraclavicular, mediastinal, or hilar lymph nodes. Lungs/Pleura: The central airways are patent. Mild interlobular septal thickening with interspersed areas of ground-glass opacities in the bilateral upper lobes. Near-complete relaxation atelectasis of the bilateral lower lobes. No pneumothorax. Pleural effusions.  Musculoskeletal: Median sternotomy wires are nondisplaced. No acute or abnormal lytic or blastic osseous lesions. Review of the MIP images confirms the above findings. CTA ABDOMEN AND PELVIS FINDINGS VASCULAR Aorta: Aortic atherosclerosis. Irregular noncalcified atherosclerotic plaque results in moderate luminal narrowing of the juxtarenal and infrarenal abdominal aorta. No aneurysm or dissection. Celiac: Patent without evidence of aneurysm, dissection, vasculitis or significant stenosis. Segmental mild to moderate luminal narrowing of the splenic artery due to calcified plaque. SMA: Segmental mild-to-moderate luminal narrowing of the SMA due to atherosclerotic plaque. Replaced hepatic artery arises from the SMA. Renals: Single bilateral renal arteries. Mild luminal narrowing of the proximal arteries due  to atherosclerotic plaque. IMA: Severe luminal narrowing of the IMA origin. Inflow: Patent without evidence of aneurysm, dissection, vasculitis or significant stenosis. Proximal Outflow: Bilateral common femoral and visualized portions of the superficial and profunda femoral arteries are patent without evidence of aneurysm, dissection, vasculitis or significant stenosis. Veins: No obvious venous abnormality within the limitations of this arterial phase study. Review of the MIP images confirms the above findings. NON-VASCULAR Hepatobiliary: No focal hepatic lesions. No intra or extrahepatic biliary ductal dilation. Normal gallbladder. Pancreas: No focal lesions or main ductal dilation. Spleen: Normal in size without focal abnormality. Adrenals/Urinary Tract: No adrenal nodules. Again seen are multiple bilateral renal cysts, including peripherally calcified irregular structure arising exophytically from the lower pole left kidney with an adjacent intermediate attenuation cyst (6:132). Some of the cysts are simple while others are hemorrhagic/proteinaceous. No hydronephrosis or calculi. Underdistended urinary bladder  contains anti dependent intraluminal gas as well as multiple scattered punctate foci of gas along the posterior urinary bladder. There is irregular mural thickening along the right bladder wall with a small nodular focus protruding into the bladder lumen measuring 7 mm (6:177). A mass in this area previously measured 2.1 cm. Stomach/Bowel: Normal appearance of the stomach. No evidence of bowel wall thickening, distention, or inflammatory changes. Appendix is not discretely seen. Lymphatic: No enlarged abdominal or pelvic lymph nodes. Reproductive: Prostate is unremarkable. Other: Small volume presacral free fluid. No free air or fluid collection. Musculoskeletal: No acute or abnormal lytic or blastic osseous lesions. Diffuse body wall edema. Review of the MIP images confirms the above findings. IMPRESSION: 1. Irregular noncalcified atherosclerotic plaque results in moderate luminal narrowing of the juxtarenal and infrarenal abdominal aorta. No aneurysm or dissection. 2. Mild interlobular septal thickening with interspersed areas of ground-glass opacities in the bilateral upper lobes, likely pulmonary edema. 3. Bilateral large pleural effusions with near-complete relaxation atelectasis of the bilateral lower lobes. 4. Irregular mural thickening along the right bladder wall with a small nodular focus protruding into the bladder lumen measuring 7 mm. A mass in this area previously measured 2.1 cm. Findings are consistent with known bladder cancer. 5. Intraluminal gas within the urinary bladder including scattered punctate foci along the posterior bladder wall. 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 6. Multiple bilateral renal cysts, some of which are simple while others are hemorrhagic/proteinaceous. These are not substantially changed in size compared to 10/19/2022 and better evaluated on prior CT dated 11/29/2022. 7.  Reflux of contrast material into the hepatic veins, suggesting a degree of right heart dysfunction. 8.  Aortic Atherosclerosis (ICD10-I70.0). Electronically Signed   By: Limin  Xu M.D.   On: 04/01/2024 18:28   CT Angio Abd/Pel w/ and/or w/o Result Date: 04/01/2024 CLINICAL DATA:  Preoperative evaluation for aortic valve replacement. History of bladder cancer. * Tracking Code: BO * EXAM: CT ANGIOGRAPHY CHEST, ABDOMEN AND PELVIS TECHNIQUE: Multidetector CT imaging through the chest, abdomen and pelvis was performed using the standard protocol during bolus administration of intravenous contrast. Multiplanar reconstructed images and MIPs were obtained and reviewed to evaluate the vascular anatomy. RADIATION DOSE REDUCTION: This exam was performed according to the departmental dose-optimization program which includes automated exposure control, adjustment of the mA and/or kV according to patient size and/or use of iterative reconstruction technique. CONTRAST:  OMNIPAQUE  IOHEXOL  350 MG/ML SOLN COMPARISON:  CT abdomen and pelvis dated 10/19/2022 FINDINGS: CTA CHEST FINDINGS Cardiovascular: Right upper extremity PICC tip terminates in the  lower SVC. Preferential opacification of the thoracic aorta. No evidence of thoracic aortic aneurysm or dissection. Irregular noncalcified atherosclerotic plaque without substantial luminal narrowing along the descending thoracic aorta. No pericardial effusion. Coronary artery calcifications. Reflux of contrast material into the hepatic veins, suggesting a degree of right heart dysfunction. Please see separately dictated cardiac CT report for detailed findings. Mediastinum/Nodes: Imaged thyroid  gland without nodules meeting criteria for imaging follow-up by size. Normal esophagus. No pathologically enlarged axillary, supraclavicular, mediastinal, or hilar lymph nodes. Lungs/Pleura: The central airways are patent. Mild interlobular septal thickening with interspersed areas of  ground-glass opacities in the bilateral upper lobes. Near-complete relaxation atelectasis of the bilateral lower lobes. No pneumothorax. Pleural effusions. Musculoskeletal: Median sternotomy wires are nondisplaced. No acute or abnormal lytic or blastic osseous lesions. Review of the MIP images confirms the above findings. CTA ABDOMEN AND PELVIS FINDINGS VASCULAR Aorta: Aortic atherosclerosis. Irregular noncalcified atherosclerotic plaque results in moderate luminal narrowing of the juxtarenal and infrarenal abdominal aorta. No aneurysm or dissection. Celiac: Patent without evidence of aneurysm, dissection, vasculitis or significant stenosis. Segmental mild to moderate luminal narrowing of the splenic artery due to calcified plaque. SMA: Segmental mild-to-moderate luminal narrowing of the SMA due to atherosclerotic plaque. Replaced hepatic artery arises from the SMA. Renals: Single bilateral renal arteries. Mild luminal narrowing of the proximal arteries due to atherosclerotic plaque. IMA: Severe luminal narrowing of the IMA origin. Inflow: Patent without evidence of aneurysm, dissection, vasculitis or significant stenosis. Proximal Outflow: Bilateral common femoral and visualized portions of the superficial and profunda femoral arteries are patent without evidence of aneurysm, dissection, vasculitis or significant stenosis. Veins: No obvious venous abnormality within the limitations of this arterial phase study. Review of the MIP images confirms the above findings. NON-VASCULAR Hepatobiliary: No focal hepatic lesions. No intra or extrahepatic biliary ductal dilation. Normal gallbladder. Pancreas: No focal lesions or main ductal dilation. Spleen: Normal in size without focal abnormality. Adrenals/Urinary Tract: No adrenal nodules. Again seen are multiple bilateral renal cysts, including peripherally calcified irregular structure arising exophytically from the lower pole left kidney with an adjacent intermediate  attenuation cyst (6:132). Some of the cysts are simple while others are hemorrhagic/proteinaceous. No hydronephrosis or calculi. Underdistended urinary bladder contains anti dependent intraluminal gas as well as multiple scattered punctate foci of gas along the posterior urinary bladder. There is irregular mural thickening along the right bladder wall with a small nodular focus protruding into the bladder lumen measuring 7 mm (6:177). A mass in this area previously measured 2.1 cm. Stomach/Bowel: Normal appearance of the stomach. No evidence of bowel wall thickening, distention, or inflammatory changes. Appendix is not discretely seen. Lymphatic: No enlarged abdominal or pelvic lymph nodes. Reproductive: Prostate is unremarkable. Other: Small volume presacral free fluid. No free air or fluid collection. Musculoskeletal: No acute or abnormal lytic or blastic osseous lesions. Diffuse body wall edema. Review of the MIP images confirms the above findings. IMPRESSION: 1. Irregular noncalcified atherosclerotic plaque results in moderate luminal narrowing of the juxtarenal and infrarenal abdominal aorta. No aneurysm or dissection. 2. Mild interlobular septal thickening with interspersed areas of ground-glass opacities in the bilateral upper lobes, likely pulmonary edema. 3. Bilateral large pleural effusions with near-complete relaxation atelectasis of the bilateral lower lobes. 4. Irregular mural thickening along the right bladder wall with a small nodular focus protruding into the bladder lumen measuring 7 mm. A mass in this area previously measured 2.1 cm. Findings are consistent with known bladder cancer. 5. Intraluminal gas within the urinary bladder including  scattered punctate foci along the posterior bladder wall. 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 6. Multiple bilateral renal cysts, some of which are simple while  others are hemorrhagic/proteinaceous. These are not substantially changed in size compared to 10/19/2022 and better evaluated on prior CT dated 11/29/2022. 7. Reflux of contrast material into the hepatic veins, suggesting a degree of right heart dysfunction. 8.  Aortic Atherosclerosis (ICD10-I70.0). Electronically Signed   By: Limin  Xu M.D.   On: 04/01/2024 18:28   DG CHEST PORT 1 VIEW Result Date: 04/01/2024 CLINICAL DATA:  Pleural effusion.  Post bilateral thoracentesis. EXAM: PORTABLE CHEST 1 VIEW COMPARISON:  Chest radiograph 03/25/2024.  CT chest 04/01/2024 FINDINGS: Postoperative changes in the mediastinum. Mild cardiac enlargement. No vascular congestion or edema. Minimal bilateral or pleural effusions representing interval decreased since prior CT post thoracentesis. No pneumothorax. Mediastinal contours appear intact. A right PICC line is in place with tip projecting over the low SVC region. Calcification in the aorta. Probable emphysematous changes in the lungs. Peribronchial thickening with perihilar infiltrates likely representing chronic bronchitis. IMPRESSION: 1. Minimal if any residual pleural effusions post thoracentesis. No pneumothorax. 2. Mild cardiac enlargement. 3. 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 Electronically Signed   By: Elsie Gravely M.D.   On: 04/01/2024 18:17   CT CORONARY MORPH W/CTA COR W/SCORE W/CA W/CM &/OR WO/CM Addendum Date: 04/01/2024 ADDENDUM REPORT: 04/01/2024 14:26 EXAM: OVER-READ INTERPRETATION  CT CHEST The following report is an over-read performed by radiologist Dr. Marcey Diones [facility] on 04/01/2024. This over-read does not include interpretation of cardiac or coronary anatomy or pathology. The coronary CTA interpretation by the cardiologist is attached. COMPARISON:  None. FINDINGS: The heart size is within normal limits. No pericardial fluid is identified. Visualized segments of the thoracic aorta and central pulmonary  arteries are normal in caliber. Visualized mediastinum and hilar regions demonstrate no lymphadenopathy or masses. Pulmonary edema with moderate bilateral pleural effusions. Associated compressive atelectasis of both lower lobes. Visualized no pulmonary consolidation, pneumothorax or focal nodule. Visualized upper abdomen and bony structures are unremarkable. IMPRESSION: Pulmonary edema with moderate bilateral pleural effusions. Electronically Signed   By: Marcey Moan M.D.   On: 04/01/2024 14:26   Result Date: 04/01/2024 CLINICAL DATA:  Aortic valve replacement (TAVR), pre-op eval [id number] Severe aortic stenosis [id number] EXAM: Cardiac TAVR CT TECHNIQUE: The patient was scanned on a Siemens Force 192 slice scanner. A 120 kV retrospective scan was triggered in the descending thoracic aorta at 111 HU's. Gantry rotation speed was 270 msecs and collimation was .9 mm. The 3D data set was reconstructed in 5% intervals of the R-R cycle. Systolic and diastolic phases were analyzed on a dedicated work station using MPR, MIP and VRT modes. The patient received 100mL OMNIPAQUE  IOHEXOL  350 MG/ML SOLN of contrast. 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 Severely thickened and severely calcified aortic valve cusps. AV calcium  score: 1859 Virtual Basal Annulus Measurements: Maximum/Minimum Diameter: 27.5 x 20.2 mm Perimeter: 74.8 mm Area:  423 mm2 No significant LVOT calcifications. Membranous septal length: 8.4 mm Based on these measurements, the annulus would be suitable for a 23 mm Sapien 3 valve. Alternatively, Heart Team can consider 29 mm Evolut valve. Recommend Heart Team discussion for valve selection. Sinus of Valsalva Measurements: Non-coronary:  35 mm Right - coronary:  34 mm Left - coronary:  34 mm Coronary height and sinus of Valsalva Height: Left  main: 22 mm, Left sinus: 26 mm Right coronary: 21 mm, Right sinus: 26 mm Aorta: Common origin of the brachiocephalic and left  common carotid artery. Severe aortic atherosclerosis with very severe atheromatous plaque in the infrarenal abdominal aorta. Sinotubular Junction:  31 mm Ascending Thoracic Aorta:  37 mm Aortic Arch:  30 mm Descending Thoracic Aorta:  27 mm Coronary Arteries: Normal coronary origin. Right dominance. The study was performed without use of NTG and insufficient for plaque evaluation. Coronary artery calcium  score not performed due to prior CABG. RIMA-LAD patent. LIMA-OM1 patent. SVG to OM2 appears occluded at the aortic anastomosis. Optimum Fluoroscopic Angle for Delivery: LAO 5 CAU 3 OTHER: Large bilateral pleural effusions. Atria: Biatrial dilation. Left atrial appendage: No thrombus. Mitral valve: Grossly normal, mild mitral annular calcifications. Pulmonary artery: Normal caliber. Pulmonary veins: Normal anatomy. IMPRESSION: 1. 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 Severely thickened and severely calcified aortic valve cusps. 2. Aortic valve calcium  score: 1859 3. Annulus area: 423 mm2, suitable for 23 mm Sapien 3 valve. No LVOT calcifications. Membranous septal length 8.4 mm. 4. Sufficient coronary artery heights from annulus. 5. Optimum fluoroscopic angle for delivery: LAO 5 CAU 3 6. Patent RIMA-LAD, LIMA-OM1.  Occluded SVG to OM2. 7. Severe aortic atherosclerosis with very severe atheromatous plaque in the infrarenal abdominal aorta. 8. Large bilateral pleural effusions. Electronically Signed: By: Soyla Merck M.D. On: 04/01/2024 14:07    Cardiac Studies 9/22 Limited TTE EF ~20%, PLFLGAS, RV size ok, RV function mildly reduced; pleural effusion  Patient Profile   89 y.o. male with history of CAD w/ prior NSTEMI in 2023 s/p CABG, AS, ischemic CM with prior recovery in EF, CKD IIIa, hx bradycardia, bladder cancer s/p TURPT, chronic anemia.   Assessment & Plan  Cardiogenic shock: - Remains stable off milrinone  - Status post bilateral thoracentesis - HF  managing  Paradoxical low-flow low gradient aortic stenosis: - I suspect the patient's etiology of his acute systolic heart failure exacerbation is due to valvular cardiomyopathy.  While his TAVR protocol CTA did not demonstrated an occluded vein graft to obtuse marginal and this would not explain his LV dysfunction.  I spoke with Dr. Gardenia about the case.  Great jeopardy for readmission given his severe LV dysfunction and significant aortic valvular disease.  I agree that he likely has a great risk for further deterioration outside of the hospital.  For this reason I recommend we pursue aortic valve intervention with a TAVR procedure prior to hospital discharge.  Tentatively planning for next Tuesday.  Coronary artery disease: - Status post multivessel CABG.  TAVR protocol CTA demonstrating occluded vein graft to obtuse marginal.  I do not think that coronary angiography is necessarily required prior to TAVR as I think it would have a little chance of altering management.  Additionally his creatinine is mildly elevated from the TAVR protocol CTA.  A  Acute kidney injury: - Creatinine up to 1.9.  Monitor for now.  Because of this we will likely defer preprocedural coronary and bypass angiography.  Mitral regurgitation: - Now only moderate after aggressive diuresis.  Continue medical therapy  DNR: - Discussed that patient would need to rescind DNR for any procedures and he agrees to this.  I discussed my thoughts with the patient.  I did try calling his daughter and wife without success and without the ability to leave a voicemail.     For questions or updates, please contact [facility] Please consult [email or web address] for contact  info under       Signed, Chayse Zatarain K Derold Dorsch, MD  04/02/2024, 5:14 PM

## 2024-04-02 NOTE — Progress Notes (Signed)
 Daily Progress Note   Date: 04/02/2024   Patient Name: Danny [name]  DOB: 1934/06/25  MRN: [id number]  Age / Sex: 89 y.o., male  Attending Physician: Fairy Frames, MD Primary Care Physician: [name] LABOR, MD Admit Date: 03/25/2024 Length of Stay: 7 days  Reason for Follow-up: Establishing goals of care  Past Medical History:  Diagnosis Date   (HFpEF) heart failure with preserved ejection fraction (HCC)    Echo 1/23: Inferior AK, mild aortic stenosis (mean 9 mmHg, V-max 210.5 cm/s, DI 0.70), EF 55-60, GR 1 DD, normal RVSF, normal PASP, trivial MR, mild AI, borderline dilation of aortic root (39 mm)   Acid reflux    hiatal hernia   Anemia    Bladder cancer (HCC)    CAD (coronary artery disease)    S/p non-STEMI 1/23 >> s/p CABG   Carotid artery disease 08/23/2021   Pre-CABG Dopplers 1/23:R ICA 40-59; L ICA 1-39 // Carotid US  07/20/2023: RICA 40-59; LICA 1-39   CHF (congestive heart failure) (HCC)    Chronic kidney disease (CKD)    Coronary artery disease involving native coronary artery of native heart without angina pectoris 05/16/2022   Inf NSTEMI s/p CABG in 07/2021 (RIMA-LAD, LIMA-OM1, S-OM2)   Diverticulitis    Erectile dysfunction 04/20/2015   Essential hypertension 04/20/2015   History of colon polyps    History of kidney stones    History of stroke    Noted on brain MRI 07/2021   Hyperlipidemia 05/28/2015   PAD (peripheral artery disease)    Pre-CABG Dopplers 1/23: Right ABI 0.65; left ABI 0.82   Skin cancer    Stroke (HCC)    noted MRI 2023 pt. unaware    Subjective:   Subjective: Chart Reviewed. Updates received. Patient Assessed. Created space and opportunity for patient  and family to explore thoughts and feelings regarding current medical situation.  Today's Discussion: Today before meeting with the patient/family, I reviewed the chart notes including hospitalist note from yesterday, nursing note from yesterday, PCCM procedural notes from  yesterday, hospitalist note from today, cardiology note from today.  I also reviewed vital signs, nursing flowsheets, medication administrations record, labs, and imaging. Labs reviewed include BMP with mild bump in creatinine from 1.68 yesterday to 1.92 today, possibly related to CT imaging with contrast yesterday, in the setting of acute acute on chronic heart failure.  Overall kidney function significantly improved and cardiology continues to workup patient for possible TAVR.  Cooximetry is improved to 65.9 today from 61 yesterday and a low of 46.7 five days ago, also remains off inotropes.  Today saw the patient at bedside, no family was present.  He states his breathing is significantly improved and we talked about thoracentesis yesterday.  Please correctly states that they removed almost 3 L with the PCCM PA draining from 1 side and Dr. Claudene PCCM physician the other.  He notes that he was able to get up and walk, feels much better.  We talked about continued improvement of creatinine, ongoing workup to be able to discuss options for how to improve his heart.  He notes that he is tired of being in the hospital and wants to go home.  We talked about how the workup is taking a while because he came in quite sick and they had to get him better, including his breathing and his kidney function, to know whether he would be a possible candidate for various options.  He verbalized understanding.  I shared that I would  check back in a couple days after time for further outcomes.  We would continue discussions based on options presented by cardiology.  I provided emotional and general support through therapeutic listening, empathy, sharing of stories, and other techniques. I answered all questions and addressed all concerns to the best of my ability.  Review of Systems  Constitutional:        Feels much better  Respiratory:  Negative for chest tightness and shortness of breath (Significantly improved after  thoracentesis).   Cardiovascular:  Negative for chest pain.  Gastrointestinal:  Negative for abdominal pain, nausea and vomiting.    Objective:   Primary Diagnoses: Present on Admission:  Acute on chronic systolic CHF (congestive heart failure) (HCC)  Macrocytic anemia  Hyperlipidemia  Essential hypertension  Chronic kidney disease, stage 3a (HCC)  PAD (peripheral artery disease)   Vital Signs:  BP 101/65 (BP Location: Left Arm)   Pulse (!) 133   Temp 97.6 F (36.4 C) (Oral)   Resp 15   Ht 5' 4 (1.626 m)   Wt 56.3 kg   SpO2 100%   BMI 21.30 kg/m   Physical Exam Vitals and nursing note reviewed.  Constitutional:      General: He is not in acute distress.    Appearance: He is ill-appearing. He is not toxic-appearing.  HENT:     Head: Normocephalic and atraumatic.  Cardiovascular:     Rate and Rhythm: Normal rate.     Heart sounds: Murmur heard.     Systolic murmur is present.  Pulmonary:     Effort: Pulmonary effort is normal. No respiratory distress.     Breath sounds: No wheezing or rhonchi.  Abdominal:     General: Abdomen is flat. Bowel sounds are normal. There is no distension.     Palpations: Abdomen is soft.  Skin:    General: Skin is warm and dry.  Neurological:     General: No focal deficit present.     Mental Status: He is alert and oriented to person, place, and time.  Psychiatric:        Mood and Affect: Mood normal.        Behavior: Behavior normal.     Palliative Assessment/Data: 60-70%   Assessment & Plan:   HPI/Patient Profile:  89 y.o. male  with past medical history of heart failure with preserved ejection fraction, hyperlipidemia, CAD s/p CABG in 2023, CKD, and recurrent bladder cancer s/p TURP who presented with complaints of generalized weakness and fatigue over the last 5 days.  He was admitted on 03/25/2024 with HFpEF, elevated troponin macrocytic anemia, AKI superimposed on CKD 3B, transaminitis papillary bladder tumor, and others.   After workup determined to be in acute systolic heart failure with EF less than 20%, severe aortic stenosis, cardiogenic shock, lactic acidosis, AKI, acute liver failure, LBBB, and others.   Palliative medicine was consulted for GOC conversations.  SUMMARY OF RECOMMENDATIONS   DNR-limited Continue full scope of care otherwise Further time for workup and options per cardiology/heart failure nausea kidney function and breathing improved Continued supportive patient and family Ongoing goals of care as needed based on clinical picture Palliative medicine will continue to follow to assist in decision making and goals of care  Symptom Management:  Per primary team Palliative medicine is available to assist as needed  Code Status: DNR - Limited (DNR/DNI)  Prognosis: Unable to determine  Discharge Planning: To Be Determined  Discussed with: Patient, medical team, nursing team  Thank you  for allowing us  to participate in the care of Sricharan Lacomb Kozinski PMT will continue to support holistically.  Billing based on MDM: Moderate  Detailed review of medical records (labs, imaging, vital signs), medically appropriate exam, discussed with treatment team, counseling and education to patient, family, & staff, documenting clinical information, medication management, coordination of care  Camellia Kays, NP Palliative Medicine Team  Team Phone # [phone] (Nights/Weekends)  03/08/2021, 8:17 AM

## 2024-04-02 NOTE — Plan of Care (Signed)

## 2024-04-02 NOTE — Progress Notes (Signed)
 Physical Therapy Treatment Patient Details Name: Danny [name] MRN: [id number] DOB: March 28, 1934 Today's Date: 04/02/2024   History of Present Illness 89 y/o M adm 03/25/24 with weakness, fatigue HFpEF, elevated troponin, AKI and Severe AS. S/p thoracentesis 9/23. PMHx:  CAD s/p CABG, CHF, CKD, HTN, PAD, stroke, HLD, bladder CA    PT Comments  The pt is making good, gradual progress. He demonstrated improved endurance today, ambulating x2 bouts of ~120 ft each. He did need extra time between transfers and gait bouts to rest before proceeding with session though. Continued to encourage frequent mobility with staff and bed level exercises outside of therapy to promote further improvement in endurance. Will continue to follow acutely.    If plan is discharge home, recommend the following: Assistance with cooking/housework;A little help with walking and/or transfers;A lot of help with bathing/dressing/bathroom;Assist for transportation;Help with stairs or ramp for entrance   Can travel by private vehicle        Equipment Recommendations  None recommended by PT    Recommendations for Other Services       Precautions / Restrictions Precautions Precautions: Fall Recall of Precautions/Restrictions: Intact Precaution/Restrictions Comments: watch SpO2 Restrictions Weight Bearing Restrictions Per Provider Order: No     Mobility  Bed Mobility Overal bed mobility: Modified Independent Bed Mobility: Supine to Sit, Sit to Supine           General bed mobility comments: Mod I using bed rail and HOB elevated for bed mobility, extra time due to fatigue    Transfers Overall transfer level: Needs assistance Equipment used: Rolling walker (2 wheels) Transfers: Sit to/from Stand Sit to Stand: Contact guard assist           General transfer comment: Pt able to stand 1x from EOB and 1x from chair without LOB, CGA for safety    Ambulation/Gait Ambulation/Gait assistance: Contact guard  assist Gait Distance (Feet): 120 Feet (x2 bouts of ~120 ft each bout) Assistive device: Rolling walker (2 wheels) Gait Pattern/deviations: Step-through pattern, Decreased stride length, Trunk flexed Gait velocity: reduced Gait velocity interpretation: <1.8 ft/sec, indicate of risk for recurrent falls   General Gait Details: Tactile and verbal cues provided for improved upright posture, but pt maintained a flexed trunk and inferior gaze. Cues provided to keep RW on ground when turning. No LOB, CGA for safety   Stairs             Wheelchair Mobility     Tilt Bed    Modified Rankin (Stroke Patients Only)       Balance Overall balance assessment: Needs assistance Sitting-balance support: Feet supported, No upper extremity supported Sitting balance-Leahy Scale: Good Sitting balance - Comments: EOB without support   Standing balance support: Bilateral upper extremity supported, During functional activity, Reliant on assistive device for balance Standing balance-Leahy Scale: Poor Standing balance comment: reliant on RW                            Communication Communication Communication: No apparent difficulties  Cognition Arousal: Alert Behavior During Therapy: WFL for tasks assessed/performed   PT - Cognitive impairments: No apparent impairments                         Following commands: Impaired Following commands impaired: Follows multi-step commands with increased time    Cueing Cueing Techniques: Verbal cues  Exercises      General Comments General comments (  skin integrity, edema, etc.): on 2L O2 throughout      Pertinent Vitals/Pain Pain Assessment Pain Assessment: Faces Faces Pain Scale: No hurt Pain Intervention(s): Monitored during session    Home Living                          Prior Function            PT Goals (current goals can now be found in the care plan section) Acute Rehab PT Goals Patient Stated  Goal: return to independence.  able to assist dtr with my wife. PT Goal Formulation: With patient Time For Goal Achievement: 04/10/24 Potential to Achieve Goals: Good Progress towards PT goals: Progressing toward goals    Frequency    Min 2X/week      PT Plan      Co-evaluation              AM-PAC PT 6 Clicks Mobility   Outcome Measure  Help needed turning from your back to your side while in a flat bed without using bedrails?: None Help needed moving from lying on your back to sitting on the side of a flat bed without using bedrails?: None Help needed moving to and from a bed to a chair (including a wheelchair)?: A Little Help needed standing up from a chair using your arms (e.g., wheelchair or bedside chair)?: A Little Help needed to walk in hospital room?: A Little Help needed climbing 3-5 steps with a railing? : A Lot 6 Click Score: 19    End of Session Equipment Utilized During Treatment: Oxygen Activity Tolerance: Patient tolerated treatment well Patient left: in bed;with call bell/phone within reach;with bed alarm set Nurse Communication: Mobility status (NT) PT Visit Diagnosis: Other abnormalities of gait and mobility (R26.89);Muscle weakness (generalized) (M62.81);Difficulty in walking, not elsewhere classified (R26.2);Unsteadiness on feet (R26.81)     Time: 8564-8496 PT Time Calculation (min) (ACUTE ONLY): 28 min  Charges:    $Gait Training: 23-37 mins PT General Charges $$ ACUTE PT VISIT: 1 Visit                     Theo Ferretti, PT, DPT Acute Rehabilitation Services  Office: [phone]    Theo CHRISTELLA Ferretti 04/02/2024, 3:17 PM

## 2024-04-03 DIAGNOSIS — Z951 Presence of aortocoronary bypass graft: Secondary | ICD-10-CM

## 2024-04-03 DIAGNOSIS — I5023 Acute on chronic systolic (congestive) heart failure: Secondary | ICD-10-CM | POA: Diagnosis not present

## 2024-04-03 DIAGNOSIS — D649 Anemia, unspecified: Secondary | ICD-10-CM

## 2024-04-03 DIAGNOSIS — R570 Cardiogenic shock: Secondary | ICD-10-CM

## 2024-04-03 DIAGNOSIS — N179 Acute kidney failure, unspecified: Secondary | ICD-10-CM

## 2024-04-03 DIAGNOSIS — I080 Rheumatic disorders of both mitral and aortic valves: Secondary | ICD-10-CM | POA: Diagnosis not present

## 2024-04-03 DIAGNOSIS — K7200 Acute and subacute hepatic failure without coma: Secondary | ICD-10-CM

## 2024-04-03 DIAGNOSIS — I350 Nonrheumatic aortic (valve) stenosis: Secondary | ICD-10-CM

## 2024-04-03 DIAGNOSIS — I252 Old myocardial infarction: Secondary | ICD-10-CM | POA: Diagnosis not present

## 2024-04-03 DIAGNOSIS — I700 Atherosclerosis of aorta: Secondary | ICD-10-CM | POA: Diagnosis not present

## 2024-04-03 DIAGNOSIS — I2510 Atherosclerotic heart disease of native coronary artery without angina pectoris: Secondary | ICD-10-CM | POA: Diagnosis not present

## 2024-04-03 LAB — CBC
HCT: 21.1 % — ABNORMAL LOW (ref 39.0–52.0)
Hemoglobin: 7.2 g/dL — ABNORMAL LOW (ref 13.0–17.0)
MCH: 35.3 pg — ABNORMAL HIGH (ref 26.0–34.0)
MCHC: 34.1 g/dL (ref 30.0–36.0)
MCV: 103.4 fL — ABNORMAL HIGH (ref 80.0–100.0)
Platelets: 74 K/uL — ABNORMAL LOW (ref 150–400)
RBC: 2.04 MIL/uL — ABNORMAL LOW (ref 4.22–5.81)
RDW: 19.9 % — ABNORMAL HIGH (ref 11.5–15.5)
WBC: 5.7 K/uL (ref 4.0–10.5)
nRBC: 0.4 % — ABNORMAL HIGH (ref 0.0–0.2)

## 2024-04-03 LAB — HEPATIC FUNCTION PANEL
ALT: 50 U/L — ABNORMAL HIGH (ref 0–44)
AST: 26 U/L (ref 15–41)
Albumin: 2.3 g/dL — ABNORMAL LOW (ref 3.5–5.0)
Alkaline Phosphatase: 71 U/L (ref 38–126)
Bilirubin, Direct: 0.3 mg/dL — ABNORMAL HIGH (ref 0.0–0.2)
Indirect Bilirubin: 0.8 mg/dL (ref 0.3–0.9)
Total Bilirubin: 1.1 mg/dL (ref 0.0–1.2)
Total Protein: 5.2 g/dL — ABNORMAL LOW (ref 6.5–8.1)

## 2024-04-03 LAB — BASIC METABOLIC PANEL WITH GFR
Anion gap: 7 (ref 5–15)
BUN: 60 mg/dL — ABNORMAL HIGH (ref 8–23)
CO2: 26 mmol/L (ref 22–32)
Calcium: 7.9 mg/dL — ABNORMAL LOW (ref 8.9–10.3)
Chloride: 97 mmol/L — ABNORMAL LOW (ref 98–111)
Creatinine, Ser: 1.87 mg/dL — ABNORMAL HIGH (ref 0.61–1.24)
GFR, Estimated: 34 mL/min — ABNORMAL LOW (ref >60)
Glucose, Bld: 90 mg/dL (ref 70–99)
Potassium: 4.1 mmol/L (ref 3.5–5.1)
Sodium: 130 mmol/L — ABNORMAL LOW (ref 135–145)

## 2024-04-03 LAB — COOXEMETRY PANEL
Carboxyhemoglobin: 2.1 % — ABNORMAL HIGH (ref 0.5–1.5)
Methemoglobin: <0.7 % (ref 0.0–1.5)
O2 Saturation: 61.9 %
Total hemoglobin: 7.4 g/dL — ABNORMAL LOW (ref 12.0–16.0)

## 2024-04-03 LAB — SURGICAL PCR SCREEN
MRSA, PCR: NEGATIVE
Staphylococcus aureus: NEGATIVE

## 2024-04-03 LAB — PREPARE RBC (CROSSMATCH)

## 2024-04-03 LAB — LACTATE DEHYDROGENASE: LDH: 232 U/L — ABNORMAL HIGH (ref 98–192)

## 2024-04-03 LAB — HEMOGLOBIN A1C
Hgb A1c MFr Bld: 5.1 % (ref 4.8–5.6)
Mean Plasma Glucose: 99.67 mg/dL

## 2024-04-03 LAB — PROTIME-INR
INR: 1.2 (ref 0.8–1.2)
Prothrombin Time: 15.6 s — ABNORMAL HIGH (ref 11.4–15.2)

## 2024-04-03 MED ORDER — NOREPINEPHRINE 4 MG/250ML-% IV SOLN
0.0000 ug/min | INTRAVENOUS | Status: AC
  Administered 2024-04-04: 2 ug/min via INTRAVENOUS
  Filled 2024-04-03: qty 250

## 2024-04-03 MED ORDER — POTASSIUM CHLORIDE 2 MEQ/ML IV SOLN
80.0000 meq | INTRAVENOUS | Status: DC
  Filled 2024-04-03: qty 40

## 2024-04-03 MED ORDER — DEXMEDETOMIDINE HCL IN NACL 400 MCG/100ML IV SOLN
0.1000 ug/kg/h | INTRAVENOUS | Status: AC
  Administered 2024-04-04: .5 ug/kg/h via INTRAVENOUS
  Filled 2024-04-03: qty 100

## 2024-04-03 MED ORDER — CEFAZOLIN SODIUM-DEXTROSE 2-4 GM/100ML-% IV SOLN
2.0000 g | INTRAVENOUS | Status: AC
  Administered 2024-04-04: 2 g via INTRAVENOUS
  Filled 2024-04-03: qty 100

## 2024-04-03 MED ORDER — MAGNESIUM SULFATE 50 % IJ SOLN
40.0000 meq | INTRAMUSCULAR | Status: DC
  Filled 2024-04-03: qty 9.85

## 2024-04-03 MED ORDER — BISACODYL 5 MG PO TBEC
5.0000 mg | DELAYED_RELEASE_TABLET | Freq: Once | ORAL | Status: AC
  Administered 2024-04-03: 5 mg via ORAL
  Filled 2024-04-03: qty 1

## 2024-04-03 MED ORDER — CHLORHEXIDINE GLUCONATE 4 % EX SOLN: 1.0000 | Freq: Once | CUTANEOUS | Status: AC

## 2024-04-03 MED ORDER — TEMAZEPAM 15 MG PO CAPS: 15.0000 mg | ORAL_CAPSULE | Freq: Once | ORAL | Status: DC | PRN

## 2024-04-03 MED ORDER — HEPARIN 30,000 UNITS/1000 ML (OHS) CELLSAVER SOLUTION
Status: DC
  Filled 2024-04-03: qty 1000

## 2024-04-03 MED ORDER — MELATONIN 3 MG PO TABS
3.0000 mg | ORAL_TABLET | Freq: Every day | ORAL | Status: DC
Start: 2024-04-03 — End: 2024-04-10
  Administered 2024-04-03 – 2024-04-09 (×7): 3 mg via ORAL
  Filled 2024-04-03 (×7): qty 1

## 2024-04-03 MED ORDER — SODIUM CHLORIDE 0.9% IV SOLUTION: Freq: Once | INTRAVENOUS | Status: AC

## 2024-04-03 MED ORDER — CHLORHEXIDINE GLUCONATE 0.12 % MT SOLN
15.0000 mL | Freq: Once | OROMUCOSAL | Status: AC
  Administered 2024-04-04: 15 mL via OROMUCOSAL
  Filled 2024-04-03: qty 15

## 2024-04-03 MED ORDER — CHLORHEXIDINE GLUCONATE CLOTH 2 % EX PADS
6.0000 | MEDICATED_PAD | Freq: Once | CUTANEOUS | Status: AC
  Administered 2024-04-04: 6 via TOPICAL

## 2024-04-03 MED ORDER — CHLORHEXIDINE GLUCONATE 0.12 % MT SOLN: 15.0000 mL | Freq: Once | OROMUCOSAL | Status: AC

## 2024-04-03 MED ORDER — CHLORHEXIDINE GLUCONATE CLOTH 2 % EX PADS
6.0000 | MEDICATED_PAD | Freq: Once | CUTANEOUS | Status: AC
  Administered 2024-04-03: 6 via TOPICAL

## 2024-04-03 NOTE — Consult Note (Addendum)
 [facility]   MULTIDISCIPLINARY HEART VALVE TEAM  Cardiothoracic Surgery Consultation:   Patient ID: Danny [name] MRN: [id number]; DOB: [birth year]  Admit date: 03/25/2024 Date of Consult: 04/03/2024  Primary Care Provider: [name] LABOR, MD [facility] Cardiologist: None  [facility] Electrophysiologist:  None    Patient Profile:   Danny [name] is a 89 y.o. male with a hx of CAD w/ prior NSTEMI 07/2021 s/p CABG (RIMA to LAD, LIMA to OM1, SVG to OM2), ischemic cardiomyopathy, hx CVA, PAD, bladder cancer, CKD IIIa, LBBB and aortic valve stenosis admitted with cardiogenic shock who is being seen today for the evaluation of severe LFLG AS at the request of Dr. Wendel  History of Present Illness:   EF previously 40-45% on LV gram at time of cath/NSTEMI in 07/2021. S/p CABG (RIMA to LAD, LIMA to OM1, SVG to OM2) by Dr. Lucas. Aortic stenosis noted to be mild at that time. EF subsequently normalized.   Last echo 12/2023 showed EF 50-55%, RWMA w/ basal inferior and basal inferoseptal severe HK, RV okay, moderate AS-severe severely calcified valve with mean gradient of 23 mmHg and AVA 1.1 cm2 by VTI.   Danny [name] lives at home with his wife who suffers from dementia and is her primary caregiver. He was previously very independent driving, grocery shopping and doing day to day ADLs without difficulty. Over the past several weeks he has suffered a rapid decline with worsening dyspnea, weakness, anorexia and abdominal bloating. He presented to the ED 03/25/24 where labs were significant for: Scr 2.2, K 5.4, Na 133, lipase 100, AST 205, ALT 207, HS troponin 484, lactic acid 2.2>2.3>1.9, hgb 7.2, BNP > 4500. ECG with SR, known LBBB w/ QRS > 150 ms, PACs. FOBT negative. Abdominal US  with evidence of biliary sludge and gallbladder wall thickening. He was given IVF, tylenol  and lokelma .   He was admitted for evaluation and management of acute on chronic HFpEF, acute on  chronic anemia, AKI on CKD IIIb and elevated LFTs. 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 Advanced Heart Failure asked to see to assist with management. Repeat TTE 03/26/24 w/ LVEF <20%, G2DD, moderate RV dysfunction, severe MR, mod-severe TR, moderate PR, moderate pleural effusion in lateral region, and severe LFLG aortic stenosis with mean gradient 17 mm hg, Vmax 2.85 m/s, AVA 0.77cm2, and moderate AI. He was eventually weaned off milrinone  and diuresed to euvolemia. Repeat limited echo 03/31/24 showed EF 20%, LV with GHK, RV mildly reduced (improved), LA mildly dilated, mod MR, AV mean gradient 19 mmHg, severe aortic stenosis.  Structural heart consulted for consideration of TAVR. His creat improved and we were able to get pre TAVR CT scans which showed anatomical characteristics consistent with aortic stenosis suitable for treatment by transcatheter aortic valve replacement without any significant complicating features and adequate pelvic vascular access to facilitate a transfemoral approach. Cardiac CT demonstrated patent RIMA-LAD and LIMA-OM1 as well as an occluded SVG-->OM which was not felt to explain his cardiomyopathy. Dr. Wendel did not think that coronary angiography was necessay as it would likely not alter management. Large bilateral pleural effusions were noted on CTs and he is now s/p -1.3L on the L and -1.4L on the R thoracentesis 04/01/24 with marked symptomatic improvement. LFTs have improved and creat stable around 1.87. Hg 7.2 today and being transfused.   Cardiothoracic surgery consulted as a multidisciplinary approach to his care.      Past Medical  History:  Diagnosis Date   (HFpEF) heart failure with preserved ejection fraction (HCC)    Echo 1/23: Inferior AK, mild aortic stenosis (mean 9 mmHg, V-max 210.5 cm/s, DI 0.70), EF 55-60, GR 1 DD, normal RVSF, normal PASP, trivial MR, mild AI, borderline dilation of aortic root (39  mm)   Acid reflux    hiatal hernia   Anemia    Bladder cancer (HCC)    CAD (coronary artery disease)    S/p non-STEMI 1/23 >> s/p CABG   Carotid artery disease 08/23/2021   Pre-CABG Dopplers 1/23:R ICA 40-59; L ICA 1-39 // Carotid US  07/20/2023: RICA 40-59; LICA 1-39   CHF (congestive heart failure) (HCC)    Chronic kidney disease (CKD)    Coronary artery disease involving native coronary artery of native heart without angina pectoris 05/16/2022   Inf NSTEMI s/p CABG in 07/2021 (RIMA-LAD, LIMA-OM1, S-OM2)   Diverticulitis    Erectile dysfunction 04/20/2015   Essential hypertension 04/20/2015   History of colon polyps    History of kidney stones    History of stroke    Noted on brain MRI 07/2021   Hyperlipidemia 05/28/2015   PAD (peripheral artery disease)    Pre-CABG Dopplers 1/23: Right ABI 0.65; left ABI 0.82   Skin cancer    Stroke [facility])    noted MRI 2023 pt. unaware    Past Surgical History:  Procedure Laterality Date   APPENDECTOMY     cataract surgery     CORONARY ARTERY BYPASS GRAFT N/A 08/01/2021   Procedure: CORONARY ARTERY BYPASS GRAFTING (CABG) X 3  ,ON PUMP, USING LEFT AND RIGHT INTERNAL MAMMARY ARTERIES, RIGHT AND LEFT ENDOSCOPIC GREATER SAPHENOUS VEIN CONDUITS;  Surgeon: Lucas Dorise POUR, MD;  Location: [facility] OR;  Service: Open Heart Surgery;  Laterality: N/A;   ENDOVEIN HARVEST OF GREATER SAPHENOUS VEIN Right 08/01/2021   Procedure: ENDOVEIN HARVEST OF GREATER SAPHENOUS VEIN;  Surgeon: Lucas Dorise POUR, MD;  Location: [facility] OR;  Service: Open Heart Surgery;  Laterality: Right;   LEFT HEART CATH AND CORONARY ANGIOGRAPHY N/A 07/27/2021   Procedure: LEFT HEART CATH AND CORONARY ANGIOGRAPHY;  Surgeon: Darron Deatrice LABOR, MD;  Location: [facility] INVASIVE CV LAB;  Service: Cardiovascular;  Laterality: N/A;   SKIN CANCER EXCISION     TEE WITHOUT CARDIOVERSION N/A 08/01/2021   Procedure: TRANSESOPHAGEAL ECHOCARDIOGRAM (TEE);  Surgeon: Lucas Dorise POUR, MD;  Location: [facility] OR;  Service: Open  Heart Surgery;  Laterality: N/A;   TRANSURETHRAL RESECTION OF BLADDER TUMOR N/A 01/03/2023   Procedure: TRANSURETHRAL RESECTION OF BLADDER TUMOR (TURBT);  Surgeon: Devere Lonni Righter, MD;  Location: [facility] ORS;  Service: Urology;  Laterality: N/A;  90 MINUTES   TRANSURETHRAL RESECTION OF BLADDER TUMOR N/A 10/10/2023   Procedure: TURBT (TRANSURETHRAL RESECTION OF BLADDER TUMOR);  Surgeon: Devere Lonni Righter, MD;  Location: [facility] ORS;  Service: Urology;  Laterality: N/A;   TRANSURETHRAL RESECTION OF PROSTATE       Home Medications:  Prior to Admission medications   Medication Sig Start Date End Date Taking? Authorizing Provider  aspirin  EC 81 MG tablet Take 1 tablet (81 mg total) by mouth daily. Swallow whole. 11/06/23  Yes Weaver, Scott T, PA-C  nitroGLYCERIN  (NITROSTAT ) 0.4 MG SL tablet Place 1 tablet (0.4 mg total) under the tongue every 5 (five) minutes as needed for chest pain. 05/16/22 03/26/25 Yes Weaver, Scott T, PA-C  rosuvastatin  (CRESTOR ) 20 MG tablet Take 1 tablet (20 mg total) by mouth daily. 01/22/24  Yes Lelon Hamilton T, PA-C  oxybutynin  (DITROPAN ) 5 MG tablet Take 1 tablet (5 mg total) by mouth every 8 (eight) hours as needed for bladder spasms. Patient not taking: Reported on 03/26/2024 10/10/23   Devere Lonni Righter, MD  phenazopyridine  (PYRIDIUM ) 100 MG tablet Take 1 tablet (100 mg total) by mouth 3 (three) times daily as needed for pain. Patient not taking: Reported on 03/26/2024 10/10/23 10/09/24  Devere Lonni Righter, MD    Inpatient Medications: Scheduled Meds:  Chlorhexidine  Gluconate Cloth  6 each Topical Daily   Influenza vac split trivalent PF  0.5 mL Intramuscular Tomorrow-1000   melatonin  3 mg Oral QHS   rosuvastatin   10 mg Oral Daily   sodium chloride  flush  3 mL Intravenous Q12H   Continuous Infusions:  PRN Meds: acetaminophen , guaiFENesin -dextromethorphan , lip balm, mouth rinse, senna, sodium chloride  flush, trimethobenzamide   Allergies:     Allergies  Allergen Reactions   Lipitor [Atorvastatin]     Muscle soreness    Social History:   Social History   Socioeconomic History   Marital status: Married    Spouse name: Not on file   Number of children: 1   Years of education: 12   Highest education level: Not on file  Occupational History   Occupation: Retired  Tobacco Use   Smoking status: Former    Types: Cigars   Smokeless tobacco: Never   Tobacco comments:    Quit 2016  Vaping Use   Vaping status: Never Used  Substance and Sexual Activity   Alcohol use: Yes    Comment: Occasional beer   Drug use: No   Sexual activity: Not Currently  Other Topics Concern   Not on file  Social History Narrative   Lives with wife and daughter lives with them also/2025   Caffeine use: 1-2 cups coffee in the am and 1 glass tea qpm   Social Drivers of Health   Financial Resource Strain: Medium Risk (11/23/2023)   Overall Financial Resource Strain (CARDIA)    Difficulty of Paying Living Expenses: Somewhat hard  Food Insecurity: No Food Insecurity (03/26/2024)   Hunger Vital Sign    Worried About Running Out of Food in the Last Year: Never true    Ran Out of Food in the Last Year: Never true  Transportation Needs: No Transportation Needs (03/26/2024)   PRAPARE - Administrator, Civil Service (Medical): No    Lack of Transportation (Non-Medical): No  Physical Activity: Inactive (11/23/2023)   Exercise Vital Sign    Days of Exercise per Week: 0 days    Minutes of Exercise per Session: 0 min  Stress: No Stress Concern Present (11/23/2023)   [employer] - Occupational Stress Questionnaire    Feeling of Stress : Only a little  Social Connections: Moderately Isolated (03/26/2024)   Social Connection and Isolation Panel    Frequency of Communication with Friends and Family: Once a week    Frequency of Social Gatherings with Friends and Family: Never    Attends Religious Services: 1 to 4  times per year    Active Member of [employer] or Organizations: No    Attends Banker Meetings: Never    Marital Status: Married  Catering manager Violence: Not At Risk (03/26/2024)   Humiliation, Afraid, Rape, and Kick questionnaire    Fear of Current or Ex-Partner: No    Emotionally Abused: No    Physically Abused: No    Sexually Abused: No    Family History:  Family History  Problem Relation Age of Onset   Stroke Father 52   Sudden death Mother 46       unknown cause     ROS:  Please see the history of present illness.  All other ROS reviewed and negative.     Physical Exam/Data:   Vitals:   04/03/24 1152 04/03/24 1155 04/03/24 1200 04/03/24 1250  BP: (!) 99/57 (!) 99/57 106/65 106/65  Pulse: 77 77 74 74  Resp: 13 13 16    Temp: (!) 97.5 F (36.4 C) (!) 97.5 F (36.4 C)    TempSrc: Oral Oral    SpO2: 100% 100% 99%   Weight:      Height:        Intake/Output Summary (Last 24 hours) at 04/03/2024 1334 Last data filed at 04/03/2024 1246 Gross per 24 hour  Intake 610 ml  Output 1300 ml  Net -690 ml      04/03/2024    4:46 AM 04/02/2024    3:31 AM 04/01/2024    3:29 AM  Last 3 Weights  Weight (lbs) 125 lb 10.6 oz 124 lb 1.9 oz 128 lb 8.5 oz  Weight (kg) 57 kg 56.3 kg 58.3 kg     Body mass index is 21.57 kg/m.  General:  thin white male HEENT: normal Lymph: no adenopathy Neck: no JV Cardiac:  normal S1, S2; RRR; 3/6 SEM.  Lungs:  clear to auscultation bilaterally, no wheezing, rhonchi or rales  Abd: soft, nontender, no hepatomegaly  Ext: no edema Musculoskeletal:  No deformities, BUE and BLE strength normal and equal Skin: warm and dry  Neuro:  CNs 2-12 intact, no focal abnormalities noted Psych:  Normal affect   EKG:  The EKG was personally reviewed and demonstrates:  Normal sinus rhythm with sinus arrhythmia, LAD, LBBB, HR 77 Left bundle branch block   Cardiac Studies & Procedures    ______________________________________________________________________________________________ CARDIAC CATHETERIZATION  CARDIAC CATHETERIZATION 07/27/2021  Conclusion   Prox RCA lesion is 100% stenosed.   1st Mrg lesion is 90% stenosed.   Mid Cx to Dist Cx lesion is 90% stenosed.   1st Diag lesion is 95% stenosed.   2nd Diag-1 lesion is 80% stenosed.   2nd Diag-2 lesion is 85% stenosed.   Mid LAD lesion is 85% stenosed.   There is mild left ventricular systolic dysfunction.   LV end diastolic pressure is normal.  1.  Moderately calcified coronary arteries with severe three-vessel coronary artery disease.  The culprit for myocardial infarction is likely occluded proximal right coronary artery.  The onset of occlusion occlusion is likely more than 12 hours with no chest pain. 2.  Mildly reduced LV systolic function with an EF of 40 to 45% with normal left ventricular end-diastolic pressure.  There is evidence of basal to mid inferior wall hypokinesis.  Recommendations: Given diffuse three-vessel coronary artery disease, I think the best option for revascularization is CABG. Resume heparin  in 4 hours at 7 PM.  Multivessel PCI to the LAD, OM1 and left circumflex is also possible if the patient is deemed to be too high risk for CABG.  Findings Coronary Findings Diagnostic  Dominance: Right  Left Main The vessel exhibits minimal luminal irregularities.  Left Anterior Descending Mid LAD lesion is 85% stenosed.  First Diagonal Branch Vessel is large in size. 1st Diag lesion is 95% stenosed.  Second Diagonal Branch Vessel is large in size. 2nd Diag-1 lesion is 80% stenosed. 2nd Diag-2 lesion is 85% stenosed.  Left Circumflex  Mid Cx to Dist Cx lesion is 90% stenosed.  First Obtuse Marginal Branch 1st Mrg lesion is 90% stenosed.  Second Obtuse Marginal Branch Vessel is small in size.  Right Coronary Artery Prox RCA lesion is 100% stenosed.  Right Posterior Descending  Artery Collaterals RPDA filled by collaterals from Dist LAD.  Third Right Posterolateral Branch Collaterals 3rd RPL filled by collaterals from Dist Cx.  Intervention  No interventions have been documented.   STRESS TESTS  NM MYOCAR MULTI W/SPECT W 10/24/2005   ECHOCARDIOGRAM  ECHOCARDIOGRAM LIMITED 03/31/2024  Narrative TRANSESOPHOGEAL ECHO REPORT    Patient Name:   Danny [name] Date of Exam: 03/31/2024 Medical Rec #:  [id number]      Height:       64.0 in Accession #:    [id number]     Weight:       128.5 lb Date of Birth:  02-16-1934     BSA:          1.621 m Patient Age:    89 years       BP:           123/66 mmHg Patient Gender: M              HR:           79 bpm. Exam Location:  Inpatient  Procedure: Limited Echo, Color Doppler and Cardiac Doppler (Both Spectral and Color Flow Doppler were utilized during procedure).  Indications:    Aortic Stenosis  History:        Patient has prior history of Echocardiogram examinations, most recent 03/26/2024.  Sonographer:    Tinnie Gosling RDCS Referring Phys: [id number] GLENDIA DASEN WEAVER  IMPRESSIONS   1. Left ventricular ejection fraction, by estimation, is 20%. The left ventricle has severely decreased function. The left ventricle demonstrates global hypokinesis. The left ventricular internal cavity size was mildly dilated. No LV thrombus noted. 2. Right ventricular systolic function is mildly reduced. The right ventricular size is normal. Tricuspid regurgitation signal is inadequate for assessing PA pressure. 3. Left atrial size was mildly dilated. 4. The mitral valve is degenerative. Moderate mitral valve regurgitation. No evidence of mitral stenosis. Moderate mitral annular calcification. 5. The aortic valve is tricuspid. There is severe calcifcation of the aortic valve. Aortic valve regurgitation is mild. Aortic valve mean gradient measures 19.0 mmHg. Visually, I suspect low flow/low gradient severe aortic stenosis. However,  no LVOT VTI measurement was made, unable to calculate aortic valve area. Would suggest repeat study with fully interrogation of the aortic valve. 6. The inferior vena cava is normal in size with <50% respiratory variability, suggesting right atrial pressure of 8 mmHg. 7. Left pleural effusion present.  FINDINGS Left Ventricle: Left ventricular ejection fraction, by estimation, is 20%. The left ventricle has severely decreased function. The left ventricle demonstrates global hypokinesis. The left ventricular internal cavity size was mildly dilated.  Right Ventricle: The right ventricular size is normal. No increase in right ventricular wall thickness. Right ventricular systolic function is mildly reduced. Tricuspid regurgitation signal is inadequate for assessing PA pressure.  Left Atrium: Left atrial size was mildly dilated.  Right Atrium: Right atrial size was normal in size.  Pericardium: Left pleural effusion present. There is no evidence of pericardial effusion.  Mitral Valve: The mitral valve is degenerative in appearance. There is mild calcification of the mitral valve leaflet(s). Moderate mitral annular calcification. Moderate mitral valve regurgitation. No evidence of mitral valve stenosis.  Tricuspid Valve: The tricuspid valve  is not assessed.  Aortic Valve: The aortic valve is tricuspid. There is severe calcifcation of the aortic valve. Aortic valve regurgitation is mild. Aortic valve mean gradient measures 19.0 mmHg. Aortic valve peak gradient measures 32.7 mmHg.  Pulmonic Valve: The pulmonic valve was not assessed.  Aorta: The aortic root is normal in size and structure.  Venous: The inferior vena cava is normal in size with less than 50% respiratory variability, suggesting right atrial pressure of 8 mmHg.  IAS/Shunts: No atrial level shunt detected by color flow Doppler.   LEFT VENTRICLE PLAX 2D LVIDd:         5.30 cm LVIDs:         4.80 cm LV PW:         0.90 cm LV  IVS:        0.90 cm LVOT diam:     2.10 cm LVOT Area:     3.46 cm  LV Volumes (MOD) LV vol d, MOD A2C: 162.0 ml LV vol d, MOD A4C: 160.0 ml LV vol s, MOD A2C: 132.0 ml LV vol s, MOD A4C: 124.0 ml LV SV MOD A2C:     30.0 ml LV SV MOD A4C:     160.0 ml LV SV MOD BP:      34.3 ml  IVC IVC diam: 1.80 cm  LEFT ATRIUM         Index LA diam:    4.10 cm 2.53 cm/m AORTIC VALVE AV Vmax:      286.00 cm/s AV Vmean:     206.000 cm/s AV VTI:       0.549 m AV Peak Grad: 32.7 mmHg AV Mean Grad: 19.0 mmHg  AORTA Ao Root diam: 3.50 cm  MITRAL VALVE MV Area (PHT): 6.12 cm    SHUNTS MV Decel Time: 124 msec    Systemic Diam: 2.10 cm MV E velocity: 92.00 cm/s MV A velocity: 70.40 cm/s MV E/A ratio:  1.31  Danny McleanMD Electronically signed by Danny [name] Signature Date/Time: 03/31/2024/9:04:33 PM    Final   TEE  ECHO INTRAOPERATIVE TEE 08/01/2021  Narrative *INTRAOPERATIVE TRANSESOPHAGEAL REPORT *    Patient Name:   Danny [name] Date of Exam: 08/01/2021 Medical Rec #:  [id number]      Height:       64.0 in Accession #:    [id number]     Weight:       155.9 lb Date of Birth:  February 22, 1934     BSA:          1.76 m Patient Age:    87 years       BP:           130/89 mmHg Patient Gender: M              HR:           85 bpm. Exam Location:  Inpatient  Transesophogeal exam was perform intraoperatively during surgical procedure. Patient was closely monitored under general anesthesia during the entirety of examination.  Indications:     coronary artery bypass surgery Performing Phys: [id number] DORISE MARLA FELLERS Diagnosing Phys: Allena Seip MD  POST-OP IMPRESSIONS _ Left Ventricle: Post Bypass: The patient came off bypass with moderate right ventricle systolic dysfunction with dilated right ventricle with inotrophe support. The TEE that was placed after induction without difficulty was removed at the end of the case uneventfully. The patient was later taken to the SICU in  stable condition. _ Right Ventricle: moderately reduced  function. Moderate right ventricular systolic dysfunction that improved with inotropes.  PRE-OP FINDINGS Left Ventricle: The left ventricle has normal systolic function, with an ejection fraction of 60-65%. The cavity size was left ventricular size was not assessed.   Mitral Valve: The mitral valve is normal in structure.  Tricuspid Valve: The tricuspid valve was normal in structure.  Aortic Valve: The aortic valve is tricuspid Aortic valve regurgitation is mild by color flow Doppler. There is moderate calcification present.  +--------------+--------++ LEFT VENTRICLE         +--------------+--------++ PLAX 2D                +--------------+--------++ LVOT diam:    2.00 cm  +--------------+--------++ LVOT Area:    3.14 cm +--------------+--------++                        +--------------+--------++  +-------------+------------++ AORTIC VALVE              +-------------+------------++ AV Vmax:     203.00 cm/s  +-------------+------------++ AV Vmean:    133.000 cm/s +-------------+------------++ AV VTI:      0.381 m      +-------------+------------++ AV Peak Grad:16.5 mmHg    +-------------+------------++ AV Mean Grad:8.0 mmHg     +-------------+------------++ AR PHT:      293 msec     +-------------+------------++   +--------------+-------+ SHUNTS                +--------------+-------+ Systemic Diam:2.00 cm +--------------+-------+   Allena Seip MD Electronically signed by Allena Seip MD Signature Date/Time: 08/04/2021/8:34:08 PM    Final  MONITORS  LONG TERM MONITOR (3-14 DAYS) 12/07/2023  Narrative   Predominant rhythm is sinus rhythm   40 episodes of supraventricular tachycardia with the fastest interval lasting 5 beats with a max HR of 176 and the longest episode lasted 12 beats with an avg. HR of 113.   1 pause lasting 3 seconds    Frequent PACs ( 7.1% PAC burden)   Occasional PVCs ( 2.1% PVC burden)   Patch Wear Time:  13 days and 23 hours (2025-04-29T15:58:01-399 to 2025-05-13T15:57:59-0400)  Patient had a min HR of 42 bpm, max HR of 176 bpm, and avg HR of 72 bpm. Predominant underlying rhythm was Sinus Rhythm. Slight P wave morphology changes were noted. Intermittent Bundle Branch Block was present. 40 Supraventricular Tachycardia runs occurred, the run with the fastest interval lasting 5 beats with a max rate of 176 bpm, the longest lasting 12 beats with an avg rate of 113 bpm. 1 Pause occurred lasting 3 secs (20 bpm). Some Pauses occurred due to Second Degree AV Block-Mobitz I (Wenckebach). Isolated SVEs were frequent (7.1%, 102580), SVE Couplets were rare (<1.0%, 7156), and SVE Triplets were rare (<1.0%, 1080). Isolated VEs were occasional (2.1%, [id number]), VE Couplets were rare (<1.0%, 1966), and no VE Triplets were present.   CT SCANS  CT CORONARY MORPH W/CTA COR W/SCORE 04/01/2024  Addendum 04/01/2024  2:28 PM ADDENDUM REPORT: 04/01/2024 14:26  EXAM: OVER-READ INTERPRETATION  CT CHEST  The following report is an over-read performed by radiologist Dr. Marcey Diones [facility] on 04/01/2024. This over-read does not include interpretation of cardiac or coronary anatomy or pathology. The coronary CTA interpretation by the cardiologist is attached.  COMPARISON:  None.  FINDINGS: The heart size is within normal limits. No pericardial fluid is identified. Visualized segments of the thoracic aorta and central pulmonary arteries are normal in caliber. Visualized mediastinum and hilar  regions demonstrate no lymphadenopathy or masses. Pulmonary edema with moderate bilateral pleural effusions. Associated compressive atelectasis of both lower lobes. Visualized no pulmonary consolidation, pneumothorax or focal nodule. Visualized upper abdomen and bony structures are  unremarkable.  IMPRESSION: Pulmonary edema with moderate bilateral pleural effusions.   Electronically Signed By: Marcey Moan M.D. On: 04/01/2024 14:26  Narrative CLINICAL DATA:  Aortic valve replacement (TAVR), pre-op eval 712435 Severe aortic stenosis [id number]  EXAM: Cardiac TAVR CT  TECHNIQUE: The patient was scanned on a Siemens Force 192 slice scanner. A 120 kV retrospective scan was triggered in the descending thoracic aorta at 111 HU's. Gantry rotation speed was 270 msecs and collimation was .9 mm. The 3D data set was reconstructed in 5% intervals of the R-R cycle. Systolic and diastolic phases were analyzed on a dedicated work station using MPR, MIP and VRT modes. The patient received 100mL OMNIPAQUE  IOHEXOL  350 MG/ML SOLN of contrast.  FINDINGS: Aortic Valve:  Tricuspid aortic valve with severely reduced cusp excursion. Severely thickened and severely calcified aortic valve cusps.  AV calcium  score: 1859  Virtual Basal Annulus Measurements:  Maximum/Minimum Diameter: 27.5 x 20.2 mm  Perimeter: 74.8 mm  Area:  423 mm2  No significant LVOT calcifications.  Membranous septal length: 8.4 mm  Based on these measurements, the annulus would be suitable for a 23 mm Sapien 3 valve. Alternatively, Heart Team can consider 29 mm Evolut valve. Recommend Heart Team discussion for valve selection.  Sinus of Valsalva Measurements:  Non-coronary:  35 mm  Right - coronary:  34 mm  Left - coronary:  34 mm  Coronary height and sinus of Valsalva Height:  Left main: 22 mm, Left sinus: 26 mm  Right coronary: 21 mm, Right sinus: 26 mm  Aorta: Common origin of the brachiocephalic and left common carotid artery. Severe aortic atherosclerosis with very severe atheromatous plaque in the infrarenal abdominal aorta.  Sinotubular Junction:  31 mm  Ascending Thoracic Aorta:  37 mm  Aortic Arch:  30 mm  Descending Thoracic Aorta:  27 mm  Coronary Arteries: Normal  coronary origin. Right dominance. The study was performed without use of NTG and insufficient for plaque evaluation. Coronary artery calcium  score not performed due to prior CABG.  RIMA-LAD patent.  LIMA-OM1 patent.  SVG to OM2 appears occluded at the aortic anastomosis.  Optimum Fluoroscopic Angle for Delivery: LAO 5 CAU 3  OTHER:  Large bilateral pleural effusions.  Atria: Biatrial dilation.  Left atrial appendage: No thrombus.  Mitral valve: Grossly normal, mild mitral annular calcifications.  Pulmonary artery: Normal caliber.  Pulmonary veins: Normal anatomy.  IMPRESSION: 1. 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 Severely thickened and severely calcified aortic valve cusps. 2. Aortic valve calcium  score: 1859 3. Annulus area: 423 mm2, suitable for 23 mm Sapien 3 valve. No LVOT calcifications. Membranous septal length 8.4 mm. 4. Sufficient coronary artery heights from annulus. 5. Optimum fluoroscopic angle for delivery: LAO 5 CAU 3 6. Patent RIMA-LAD, LIMA-OM1.  Occluded SVG to OM2. 7. Severe aortic atherosclerosis with very severe atheromatous plaque in the infrarenal abdominal aorta. 8. Large bilateral pleural effusions.  Electronically Signed: By: Soyla Merck M.D. On: 04/01/2024 14:07     ______________________________________________________________________________________________      Laboratory Data:  High Sensitivity Troponin:   Recent Labs  Lab 03/26/24 0404  TROPONINIHS 484*     Chemistry Recent Labs  Lab 04/01/24 0650 04/02/24 0500 04/03/24 0434  NA 130* 131* 130*  K 5.0 4.2 4.1  CL  98 99 97*  CO2 22 24 26   GLUCOSE 102* 125* 90  BUN 58* 60* 60*  CREATININE 1.68* 1.92* 1.87*  CALCIUM  8.4* 8.2* 7.9*  GFRNONAA 39* 33* 34*  ANIONGAP 10 8 7     Recent Labs  Lab 03/29/24 0600 04/01/24 0650 04/03/24 0434  PROT 6.0* 5.9* 5.2*  ALBUMIN  2.8* 2.6* 2.3*  AST 76* 38 26  ALT 158* 86* 50*  ALKPHOS 115 83 71   BILITOT 1.0 1.1 1.1   Hematology Recent Labs  Lab 03/29/24 0600 03/31/24 0602 04/03/24 0434  WBC 4.8 6.6 5.7  RBC 2.19* 2.30* 2.04*  HGB 7.7* 8.1* 7.2*  HCT 22.8* 24.3* 21.1*  MCV 104.1* 105.7* 103.4*  MCH 35.2* 35.2* 35.3*  MCHC 33.8 33.3 34.1  RDW 19.8* 20.2* 19.9*  PLT 71* 80* 74*   BNPNo results for input(s): BNP, PROBNP in the last 168 hours.  DDimer No results for input(s): DDIMER in the last 168 hours.   Radiology/Studies:  DG CHEST PORT 1 VIEW Result Date: 04/02/2024 CLINICAL DATA:  Bilateral pleural effusions. EXAM: PORTABLE CHEST 1 VIEW COMPARISON:  Radiograph and CT yesterday FINDINGS: Stable right upper extremity PICC tip in the SVC. Unchanged cardiomegaly post median sternotomy and CABG. Trace bilateral pleural effusions. Scattered atelectasis. Improving ground-glass opacities in the upper lung zones. No pneumothorax. IMPRESSION: 1. Trace bilateral pleural effusions. 2. Improving ground-glass opacities in the upper lung zones. Electronically Signed   By: Andrea Gasman M.D.   On: 04/02/2024 12:50   CT ANGIO CHEST AORTA W/CM & OR WO/CM Result Date: 04/01/2024 CLINICAL DATA:  Preoperative evaluation for aortic valve replacement. History of bladder cancer. * Tracking Code: BO * EXAM: CT ANGIOGRAPHY CHEST, ABDOMEN AND PELVIS TECHNIQUE: Multidetector CT imaging through the chest, abdomen and pelvis was performed using the standard protocol during bolus administration of intravenous contrast. Multiplanar reconstructed images and MIPs were obtained and reviewed to evaluate the vascular anatomy. RADIATION DOSE REDUCTION: This exam was performed according to the departmental dose-optimization program which includes automated exposure control, adjustment of the mA and/or kV according to patient size and/or use of iterative reconstruction technique. CONTRAST:  OMNIPAQUE  IOHEXOL  350 MG/ML SOLN COMPARISON:  CT abdomen and pelvis dated 10/19/2022 FINDINGS: CTA CHEST FINDINGS  Cardiovascular: Right upper extremity PICC tip terminates in the lower SVC. Preferential opacification of the thoracic aorta. No evidence of thoracic aortic aneurysm or dissection. Irregular noncalcified atherosclerotic plaque without substantial luminal narrowing along the descending thoracic aorta. No pericardial effusion. Coronary artery calcifications. Reflux of contrast material into the hepatic veins, suggesting a degree of right heart dysfunction. Please see separately dictated cardiac CT report for detailed findings. Mediastinum/Nodes: Imaged thyroid  gland without nodules meeting criteria for imaging follow-up by size. Normal esophagus. No pathologically enlarged axillary, supraclavicular, mediastinal, or hilar lymph nodes. Lungs/Pleura: The central airways are patent. Mild interlobular septal thickening with interspersed areas of ground-glass opacities in the bilateral upper lobes. Near-complete relaxation atelectasis of the bilateral lower lobes. No pneumothorax. Pleural effusions. Musculoskeletal: Median sternotomy wires are nondisplaced. No acute or abnormal lytic or blastic osseous lesions. Review of the MIP images confirms the above findings. CTA ABDOMEN AND PELVIS FINDINGS VASCULAR Aorta: Aortic atherosclerosis. Irregular noncalcified atherosclerotic plaque results in moderate luminal narrowing of the juxtarenal and infrarenal abdominal aorta. No aneurysm or dissection. Celiac: Patent without evidence of aneurysm, dissection, vasculitis or significant stenosis. Segmental mild to moderate luminal narrowing of the splenic artery due to calcified plaque. SMA: Segmental mild-to-moderate luminal narrowing of the SMA due to  atherosclerotic plaque. Replaced hepatic artery arises from the SMA. Renals: Single bilateral renal arteries. Mild luminal narrowing of the proximal arteries due to atherosclerotic plaque. IMA: Severe luminal narrowing of the IMA origin. Inflow: Patent without evidence of aneurysm,  dissection, vasculitis or significant stenosis. Proximal Outflow: Bilateral common femoral and visualized portions of the superficial and profunda femoral arteries are patent without evidence of aneurysm, dissection, vasculitis or significant stenosis. Veins: No obvious venous abnormality within the limitations of this arterial phase study. Review of the MIP images confirms the above findings. NON-VASCULAR Hepatobiliary: No focal hepatic lesions. No intra or extrahepatic biliary ductal dilation. Normal gallbladder. Pancreas: No focal lesions or main ductal dilation. Spleen: Normal in size without focal abnormality. Adrenals/Urinary Tract: No adrenal nodules. Again seen are multiple bilateral renal cysts, including peripherally calcified irregular structure arising exophytically from the lower pole left kidney with an adjacent intermediate attenuation cyst (6:132). Some of the cysts are simple while others are hemorrhagic/proteinaceous. No hydronephrosis or calculi. Underdistended urinary bladder contains anti dependent intraluminal gas as well as multiple scattered punctate foci of gas along the posterior urinary bladder. There is irregular mural thickening along the right bladder wall with a small nodular focus protruding into the bladder lumen measuring 7 mm (6:177). A mass in this area previously measured 2.1 cm. Stomach/Bowel: Normal appearance of the stomach. No evidence of bowel wall thickening, distention, or inflammatory changes. Appendix is not discretely seen. Lymphatic: No enlarged abdominal or pelvic lymph nodes. Reproductive: Prostate is unremarkable. Other: Small volume presacral free fluid. No free air or fluid collection. Musculoskeletal: No acute or abnormal lytic or blastic osseous lesions. Diffuse body wall edema. Review of the MIP images confirms the above findings. IMPRESSION: 1. Irregular noncalcified atherosclerotic plaque results in moderate luminal narrowing of the juxtarenal and infrarenal  abdominal aorta. No aneurysm or dissection. 2. Mild interlobular septal thickening with interspersed areas of ground-glass opacities in the bilateral upper lobes, likely pulmonary edema. 3. Bilateral large pleural effusions with near-complete relaxation atelectasis of the bilateral lower lobes. 4. Irregular mural thickening along the right bladder wall with a small nodular focus protruding into the bladder lumen measuring 7 mm. A mass in this area previously measured 2.1 cm. Findings are consistent with known bladder cancer. 5. Intraluminal gas within the urinary bladder including scattered punctate foci along the posterior bladder wall. 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 6. Multiple bilateral renal cysts, some of which are simple while others are hemorrhagic/proteinaceous. These are not substantially changed in size compared to 10/19/2022 and better evaluated on prior CT dated 11/29/2022. 7. Reflux of contrast material into the hepatic veins, suggesting a degree of right heart dysfunction. 8.  Aortic Atherosclerosis (ICD10-I70.0). Electronically Signed   By: Limin  Xu M.D.   On: 04/01/2024 18:28   CT Angio Abd/Pel w/ and/or w/o Result Date: 04/01/2024 CLINICAL DATA:  Preoperative evaluation for aortic valve replacement. History of bladder cancer. * Tracking Code: BO * EXAM: CT ANGIOGRAPHY CHEST, ABDOMEN AND PELVIS TECHNIQUE: Multidetector CT imaging through the chest, abdomen and pelvis was performed using the standard protocol during bolus administration of intravenous contrast. Multiplanar reconstructed images and MIPs were obtained and reviewed to evaluate the vascular anatomy. RADIATION DOSE REDUCTION: This exam was performed according to the departmental dose-optimization program which includes automated exposure control, adjustment of the mA and/or kV according to patient size and/or use of iterative  reconstruction technique. CONTRAST:  OMNIPAQUE  IOHEXOL  350  MG/ML SOLN COMPARISON:  CT abdomen and pelvis dated 10/19/2022 FINDINGS: CTA CHEST FINDINGS Cardiovascular: Right upper extremity PICC tip terminates in the lower SVC. Preferential opacification of the thoracic aorta. No evidence of thoracic aortic aneurysm or dissection. Irregular noncalcified atherosclerotic plaque without substantial luminal narrowing along the descending thoracic aorta. No pericardial effusion. Coronary artery calcifications. Reflux of contrast material into the hepatic veins, suggesting a degree of right heart dysfunction. Please see separately dictated cardiac CT report for detailed findings. Mediastinum/Nodes: Imaged thyroid  gland without nodules meeting criteria for imaging follow-up by size. Normal esophagus. No pathologically enlarged axillary, supraclavicular, mediastinal, or hilar lymph nodes. Lungs/Pleura: The central airways are patent. Mild interlobular septal thickening with interspersed areas of ground-glass opacities in the bilateral upper lobes. Near-complete relaxation atelectasis of the bilateral lower lobes. No pneumothorax. Pleural effusions. Musculoskeletal: Median sternotomy wires are nondisplaced. No acute or abnormal lytic or blastic osseous lesions. Review of the MIP images confirms the above findings. CTA ABDOMEN AND PELVIS FINDINGS VASCULAR Aorta: Aortic atherosclerosis. Irregular noncalcified atherosclerotic plaque results in moderate luminal narrowing of the juxtarenal and infrarenal abdominal aorta. No aneurysm or dissection. Celiac: Patent without evidence of aneurysm, dissection, vasculitis or significant stenosis. Segmental mild to moderate luminal narrowing of the splenic artery due to calcified plaque. SMA: Segmental mild-to-moderate luminal narrowing of the SMA due to atherosclerotic plaque. Replaced hepatic artery arises from the SMA. Renals: Single bilateral renal arteries. Mild luminal  narrowing of the proximal arteries due to atherosclerotic plaque. IMA: Severe luminal narrowing of the IMA origin. Inflow: Patent without evidence of aneurysm, dissection, vasculitis or significant stenosis. Proximal Outflow: Bilateral common femoral and visualized portions of the superficial and profunda femoral arteries are patent without evidence of aneurysm, dissection, vasculitis or significant stenosis. Veins: No obvious venous abnormality within the limitations of this arterial phase study. Review of the MIP images confirms the above findings. NON-VASCULAR Hepatobiliary: No focal hepatic lesions. No intra or extrahepatic biliary ductal dilation. Normal gallbladder. Pancreas: No focal lesions or main ductal dilation. Spleen: Normal in size without focal abnormality. Adrenals/Urinary Tract: No adrenal nodules. Again seen are multiple bilateral renal cysts, including peripherally calcified irregular structure arising exophytically from the lower pole left kidney with an adjacent intermediate attenuation cyst (6:132). Some of the cysts are simple while others are hemorrhagic/proteinaceous. No hydronephrosis or calculi. Underdistended urinary bladder contains anti dependent intraluminal gas as well as multiple scattered punctate foci of gas along the posterior urinary bladder. There is irregular mural thickening along the right bladder wall with a small nodular focus protruding into the bladder lumen measuring 7 mm (6:177). A mass in this area previously measured 2.1 cm. Stomach/Bowel: Normal appearance of the stomach. No evidence of bowel wall thickening, distention, or inflammatory changes. Appendix is not discretely seen. Lymphatic: No enlarged abdominal or pelvic lymph nodes. Reproductive: Prostate is unremarkable. Other: Small volume presacral free fluid. No free air or fluid collection. Musculoskeletal: No acute or abnormal lytic or blastic osseous lesions. Diffuse body wall edema. Review of the MIP images  confirms the above findings. IMPRESSION: 1. Irregular noncalcified atherosclerotic plaque results in moderate luminal narrowing of the juxtarenal and infrarenal abdominal aorta. No aneurysm or dissection. 2. Mild interlobular septal thickening with interspersed areas of ground-glass opacities in the bilateral upper lobes, likely pulmonary edema. 3. Bilateral large pleural effusions with near-complete relaxation atelectasis of the bilateral lower lobes. 4. Irregular mural thickening along the right bladder wall with a small nodular focus protruding into the bladder lumen measuring 7 mm. A  mass in this area previously measured 2.1 cm. Findings are consistent with known bladder cancer. 5. Intraluminal gas within the urinary bladder including scattered punctate foci along the posterior bladder wall. 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 6. Multiple bilateral renal cysts, some of which are simple while others are hemorrhagic/proteinaceous. These are not substantially changed in size compared to 10/19/2022 and better evaluated on prior CT dated 11/29/2022. 7. Reflux of contrast material into the hepatic veins, suggesting a degree of right heart dysfunction. 8.  Aortic Atherosclerosis (ICD10-I70.0). Electronically Signed   By: Limin  Xu M.D.   On: 04/01/2024 18:28   DG CHEST PORT 1 VIEW Result Date: 04/01/2024 CLINICAL DATA:  Pleural effusion.  Post bilateral thoracentesis. EXAM: PORTABLE CHEST 1 VIEW COMPARISON:  Chest radiograph 03/25/2024.  CT chest 04/01/2024 FINDINGS: Postoperative changes in the mediastinum. Mild cardiac enlargement. No vascular congestion or edema. Minimal bilateral or pleural effusions representing interval decreased since prior CT post thoracentesis. No pneumothorax. Mediastinal contours appear intact. A right PICC line is in place with tip projecting over the low SVC region. Calcification in the  aorta. Probable emphysematous changes in the lungs. Peribronchial thickening with perihilar infiltrates likely representing chronic bronchitis. IMPRESSION: 1. Minimal if any residual pleural effusions post thoracentesis. No pneumothorax. 2. Mild cardiac enlargement. 3. 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 Electronically Signed   By: Elsie Gravely M.D.   On: 04/01/2024 18:17   CT CORONARY MORPH W/CTA COR W/SCORE W/CA W/CM &/OR WO/CM Addendum Date: 04/01/2024 ADDENDUM REPORT: 04/01/2024 14:26 EXAM: OVER-READ INTERPRETATION  CT CHEST The following report is an over-read performed by radiologist Dr. Marcey Diones [facility] on 04/01/2024. This over-read does not include interpretation of cardiac or coronary anatomy or pathology. The coronary CTA interpretation by the cardiologist is attached. COMPARISON:  None. FINDINGS: The heart size is within normal limits. No pericardial fluid is identified. Visualized segments of the thoracic aorta and central pulmonary arteries are normal in caliber. Visualized mediastinum and hilar regions demonstrate no lymphadenopathy or masses. Pulmonary edema with moderate bilateral pleural effusions. Associated compressive atelectasis of both lower lobes. Visualized no pulmonary consolidation, pneumothorax or focal nodule. Visualized upper abdomen and bony structures are unremarkable. IMPRESSION: Pulmonary edema with moderate bilateral pleural effusions. Electronically Signed   By: Marcey Moan M.D.   On: 04/01/2024 14:26   Result Date: 04/01/2024 CLINICAL DATA:  Aortic valve replacement (TAVR), pre-op eval [id number] Severe aortic stenosis [id number] EXAM: Cardiac TAVR CT TECHNIQUE: The patient was scanned on a Siemens Force 192 slice scanner. A 120 kV retrospective scan was triggered in the descending thoracic aorta at 111 HU's. Gantry rotation speed was 270 msecs and collimation was .9 mm. The 3D data set was reconstructed in 5% intervals of the R-R cycle.  Systolic and diastolic phases were analyzed on a dedicated work station using MPR, MIP and VRT modes. The patient received 100mL OMNIPAQUE  IOHEXOL  350 MG/ML SOLN of contrast. 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 Severely thickened and severely calcified aortic valve cusps. AV calcium  score: 1859 Virtual Basal Annulus Measurements: Maximum/Minimum Diameter: 27.5 x 20.2 mm Perimeter: 74.8 mm Area:  423 mm2 No significant LVOT calcifications. Membranous septal length: 8.4 mm Based on these measurements, the annulus would be suitable for a 23 mm Sapien 3 valve. Alternatively, Heart Team can consider 29 mm Evolut valve. Recommend Heart Team discussion for valve selection. Sinus of Valsalva Measurements: Non-coronary:  35 mm Right - coronary:  34 mm Left - coronary:  34 mm Coronary height and sinus of Valsalva Height: Left main: 22 mm, Left sinus: 26 mm Right coronary: 21 mm, Right sinus: 26 mm Aorta: Common origin of the brachiocephalic and left common carotid artery. Severe aortic atherosclerosis with very severe atheromatous plaque in the infrarenal abdominal aorta. Sinotubular Junction:  31 mm Ascending Thoracic Aorta:  37 mm Aortic Arch:  30 mm Descending Thoracic Aorta:  27 mm Coronary Arteries: Normal coronary origin. Right dominance. The study was performed without use of NTG and insufficient for plaque evaluation. Coronary artery calcium  score not performed due to prior CABG. RIMA-LAD patent. LIMA-OM1 patent. SVG to OM2 appears occluded at the aortic anastomosis. Optimum Fluoroscopic Angle for Delivery: LAO 5 CAU 3 OTHER: Large bilateral pleural effusions. Atria: Biatrial dilation. Left atrial appendage: No thrombus. Mitral valve: Grossly normal, mild mitral annular calcifications. Pulmonary artery: Normal caliber. Pulmonary veins: Normal anatomy. IMPRESSION: 1. 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 Severely thickened and severely calcified  aortic valve cusps. 2. Aortic valve calcium  score: 1859 3. Annulus area: 423 mm2, suitable for 23 mm Sapien 3 valve. No LVOT calcifications. Membranous septal length 8.4 mm. 4. Sufficient coronary artery heights from annulus. 5. Optimum fluoroscopic angle for delivery: LAO 5 CAU 3 6. Patent RIMA-LAD, LIMA-OM1.  Occluded SVG to OM2. 7. Severe aortic atherosclerosis with very severe atheromatous plaque in the infrarenal abdominal aorta. 8. Large bilateral pleural effusions. Electronically Signed: By: Soyla Merck M.D. On: 04/01/2024 14:07   ECHOCARDIOGRAM LIMITED Result Date: 03/31/2024    TRANSESOPHOGEAL ECHO REPORT   Patient Name:   Danny [name] Date of Exam: 03/31/2024 Medical Rec #:  [id number]      Height:       64.0 in Accession #:    [id number]     Weight:       128.5 lb Date of Birth:  09-21-1933     BSA:          1.621 m Patient Age:    89 years       BP:           123/66 mmHg Patient Gender: M              HR:           79 bpm. Exam Location:  Inpatient Procedure: Limited Echo, Color Doppler and Cardiac Doppler (Both Spectral and            Color Flow Doppler were utilized during procedure). Indications:    Aortic Stenosis  History:        Patient has prior history of Echocardiogram examinations, most                 recent 03/26/2024.  Sonographer:    Tinnie Gosling RDCS Referring Phys: [id number] GLENDIA DASEN WEAVER IMPRESSIONS  1. Left ventricular ejection fraction, by estimation, is 20%. The left ventricle has severely decreased function. The left ventricle demonstrates global hypokinesis. The left ventricular internal cavity size was mildly dilated. No LV thrombus noted.  2. Right ventricular systolic function is mildly reduced. The right ventricular size is normal. Tricuspid regurgitation signal is inadequate for assessing PA pressure.  3. Left atrial size was mildly dilated.  4. The mitral valve is degenerative. Moderate mitral valve regurgitation. No evidence of mitral stenosis. Moderate mitral annular  calcification.  5. The aortic valve is tricuspid. There is severe calcifcation of the aortic valve. Aortic valve regurgitation  is mild. Aortic valve mean gradient measures 19.0 mmHg. Visually, I suspect low flow/low gradient severe aortic stenosis. However, no LVOT VTI  measurement was made, unable to calculate aortic valve area. Would suggest repeat study with fully interrogation of the aortic valve.  6. The inferior vena cava is normal in size with <50% respiratory variability, suggesting right atrial pressure of 8 mmHg.  7. Left pleural effusion present. FINDINGS  Left Ventricle: Left ventricular ejection fraction, by estimation, is 20%. The left ventricle has severely decreased function. The left ventricle demonstrates global hypokinesis. The left ventricular internal cavity size was mildly dilated. Right Ventricle: The right ventricular size is normal. No increase in right ventricular wall thickness. Right ventricular systolic function is mildly reduced. Tricuspid regurgitation signal is inadequate for assessing PA pressure. Left Atrium: Left atrial size was mildly dilated. Right Atrium: Right atrial size was normal in size. Pericardium: Left pleural effusion present. There is no evidence of pericardial effusion. Mitral Valve: The mitral valve is degenerative in appearance. There is mild calcification of the mitral valve leaflet(s). Moderate mitral annular calcification. Moderate mitral valve regurgitation. No evidence of mitral valve stenosis. Tricuspid Valve: The tricuspid valve is not assessed. Aortic Valve: The aortic valve is tricuspid. There is severe calcifcation of the aortic valve. Aortic valve regurgitation is mild. Aortic valve mean gradient measures 19.0 mmHg. Aortic valve peak gradient measures 32.7 mmHg. Pulmonic Valve: The pulmonic valve was not assessed. Aorta: The aortic root is normal in size and structure. Venous: The inferior vena cava is normal in size with less than 50% respiratory  variability, suggesting right atrial pressure of 8 mmHg. IAS/Shunts: No atrial level shunt detected by color flow Doppler.  LEFT VENTRICLE PLAX 2D LVIDd:         5.30 cm LVIDs:         4.80 cm LV PW:         0.90 cm LV IVS:        0.90 cm LVOT diam:     2.10 cm LVOT Area:     3.46 cm  LV Volumes (MOD) LV vol d, MOD A2C: 162.0 ml LV vol d, MOD A4C: 160.0 ml LV vol s, MOD A2C: 132.0 ml LV vol s, MOD A4C: 124.0 ml LV SV MOD A2C:     30.0 ml LV SV MOD A4C:     160.0 ml LV SV MOD BP:      34.3 ml IVC IVC diam: 1.80 cm LEFT ATRIUM         Index LA diam:    4.10 cm 2.53 cm/m  AORTIC VALVE AV Vmax:      286.00 cm/s AV Vmean:     206.000 cm/s AV VTI:       0.549 m AV Peak Grad: 32.7 mmHg AV Mean Grad: 19.0 mmHg  AORTA Ao Root diam: 3.50 cm MITRAL VALVE MV Area (PHT): 6.12 cm    SHUNTS MV Decel Time: 124 msec    Systemic Diam: 2.10 cm MV E velocity: 92.00 cm/s MV A velocity: 70.40 cm/s MV E/A ratio:  1.31 Danny McleanMD Electronically signed by Danny [name] Signature Date/Time: 03/31/2024/9:04:33 PM    Final      Pre Surgical Assessment: 5 M Walk Test  62M=16.15ft  5 Meter Walk Test- trial 1: 7 seconds 5 Meter Walk Test- trial 2: 7 seconds 5 Meter Walk Test- trial 3: 9 seconds 5 Meter Walk Test Average: 7.66 seconds    ________________________________       Procedure  Type: Isolated AVR Perioperative OutcomeEstimate % Operative Mortality48.6% Morbidity & Mortality74% Stroke12.1% Renal Failure37.4% Reoperation17% Prolonged Ventilation69.5% Deep Sternal Wound Infection0.168% [facility] Stay (>14 days)54.9% [facility] Stay (<6 days)*2.31%     ______________________________     [place] Cardiomyopathy Questionnaire      03/28/2024   11:34 AM  KCCQ-12  1 a. Ability to shower/bathe Extremely limited  1 b. Ability to walk 1 block Extremely limited  1 c. Ability to hurry/jog Other, Did not do  2. Edema feet/ankles/legs Every morning  3. Limited by fatigue All of the time  4.  Limited by dyspnea All of the time  5. Sitting up / on 3+ pillows Every night  6. Limited enjoyment of life Extremely limited  7. Rest of life w/ symptoms Not at all satisfied  8 a. Participation in hobbies Severely limited  8 b. Participation in chores Severely limited  8 c. Visiting family/friends Severely limited        Assessment and Plan:   Danny [name] is a 89 y.o. male with symptoms of severe, stage D2 LFLG aortic stenosis with NYHA Class IV symptoms who was admitted for biventricular cardiogenic shock requiring milrinone  support with AKI and shock liver as well as anemia. He diuresed well on IV Lasix . Co-ox now stable and off milrinone . Repeat echo showed improvement in RV function. LFTs normalizing. Creat stable. 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  He has been seen by palliative care and remains full code and strongly desires anything that will improve his QOL and get him home to his wife and daughter. He is now a DNR.    TTE 03/26/24 w/ LVEF <20%, G2DD, moderate RV dysfunction, severe MR, mod-severe TR, moderate PR, moderate pleural effusion in lateral region, and severe LFLG aortic stenosis with mean gradient 17 mm hg, Vmax 2.85 m/s, AVA 0.77cm2, and moderate AI.   Repeat limited echo 03/31/24 showed EF 20%, LV with GHK, RV mildly reduced (improved), LA mildly dilated, mod MR, AV mean gradient 19 mmHg, severe aortic stenosis.    04/01/24: Cardiac gated CTA of the heart revealed anatomical characteristics consistent with aortic stenosis suitable for treatment by transcatheter aortic valve replacement without any significant complicating features and CTA of the aorta and iliac vessels demonstrate what appear to be adequate pelvic vascular access to facilitate a transfemoral approach.    Cardiac CT demonstrated patent RIMA-LAD and LIMA-OM1 as well as an occluded SVG-->OM which was not felt to explain his cardiomyopathy. Dr. Wendel did not think  that coronary angiography was necessay as it would likely not alter management. Large bilateral pleural effusions were noted on CTs and he is now s/p -1.3L on the L and -1.4L on the R thoracentesis 04/01/24 with marked symptomatic improvement. LFTs have improved and creat stable around 1.87. Hg 7.2 today and being transfused.    I have reviewed the natural history of aortic stenosis with the patient. We have discussed the limitations of medical therapy and the poor prognosis associated with symptomatic aortic stenosis. We have reviewed potential treatment options, including palliative medical therapy, conventional surgical aortic valve replacement, and transcatheter aortic valve replacement. We discussed treatment options in the context of this patient's specific comorbid medical conditions.    The patient's predicted risk of mortality with conventional aortic valve replacement is 48.6% primarily based on age, low BMI, previous sternotomy, with CABG, cardiogenic shock, acute on chronic kidney disease, anemia. Other significant comorbid conditions include RV dysfunction, which has now improved  somewhat. He is not a surgical candidate under any circumstances. Of note, he is DNR.  The pt is posted for TAVR-TF tomorrow @ 7:30am with Dr. Wendel, Dr. Lucas and Dr. Daniel. He is not a bailout candidate. Inpatient orders placed.      Signed, Lamarr Hummer, PA-C  04/03/2024 1:34 PM   Chart reviewed, patient examined, agree with above.  This 89 year old gentleman is known to me status post CABG x 3 in 2023 with a LIMA to the OM1, RIMA to the LAD, and vein graft to an obtuse marginal branch.  TEE at the time of his surgery showed a moderately calcified trileaflet aortic valve with a mean gradient of 8 mmHg and therefore it was not replaced.  He had a follow-up echocardiogram in June 2025 showing a mean gradient of 23 mmHg with a dimensionless index of 0.35 and a valve area of 1.1 cm by VTI.  He now presents  with NYHA class IV symptoms of fatigue, shortness of breath, orthopnea, and peripheral edema and was admitted with biventricular cardiogenic shock requiring milrinone  support.  He developed acute kidney injury and shock liver as well as anemia.  He improved with diuresis and was weaned off milrinone .  Echocardiogram on 03/26/2024 showed a left ventricular ejection fraction of less than 20% with moderate RV dysfunction.  There was severe mitral regurgitation and severe low-flow/low gradient aortic stenosis with a mean gradient of 17 mmHg, AVA of 0.77 cm, and moderate AI.  A follow-up echocardiogram on 03/31/2024 showed an ejection fraction of 20% with global hypokinesis.  Right ventricular function was improved.  There was severe aortic stenosis with a mean gradient of 19 mmHg and moderate mitral regurgitation.  I agree that aortic valve replacement is the only good option for treating this patient.  He is not a candidate for open double valve replacement.  His gated cardiac CTA shows anatomy suitable for TAVR using a 23 mm SAPIEN 3 valve.  His abdominal and pelvic CTA shows anatomy suitable for transfemoral insertion.  There is severe atheromatous plaque in the infrarenal abdominal aorta which will increase his risk of peripheral embolization. We discussed complications that might develop including but not limited to risks of death, stroke, paravalvular leak, aortic dissection or other major vascular complications, aortic annulus rupture, device embolization, cardiac rupture or perforation, mitral regurgitation, acute myocardial infarction, arrhythmia, heart block or bradycardia requiring permanent pacemaker placement, congestive heart failure, respiratory failure, renal failure, pneumonia, infection, other late complications related to structural valve deterioration or migration, or other complications that might ultimately cause a temporary or permanent loss of functional independence or other long term morbidity.   He is not a candidate for emergent sternotomy to manage any intraoperative complications.  The patient provides full informed consent for the procedure as described and all questions were answered.   Dorise LOIS Lucas, MD

## 2024-04-03 NOTE — Progress Notes (Signed)
 Occupational Therapy Treatment Patient Details Name: Danny [name] MRN: [id number] DOB: 1934-01-19 Today's Date: 04/03/2024   History of present illness 89 y/o M adm 03/25/24 with weakness, fatigue HFpEF, elevated troponin, AKI and Severe AS. S/p thoracentesis 9/23. PMHx:  CAD s/p CABG, CHF, CKD, HTN, PAD, stroke, HLD, bladder CA   OT comments  Pt progressing towards goals. Progressed to complete toilet transfer with rollator at [facility]. Pt continues to be limited by decreased activity tolerance requiring >50 minutes d/t fatigue. Pt with plans for upcoming procedures that hope to stabilize pt medically, to aid in progressing functionally. Continue to recommend HHOT to optimize independence levels. Will continue to follow acutely.      If plan is discharge home, recommend the following:  A little help with walking and/or transfers;A lot of help with bathing/dressing/bathroom;Assistance with cooking/housework;Assist for transportation;Help with stairs or ramp for entrance   Equipment Recommendations  Other (comment) (Rollator)       Precautions / Restrictions Precautions Precautions: Fall Recall of Precautions/Restrictions: Intact Precaution/Restrictions Comments: watch SpO2 Restrictions Weight Bearing Restrictions Per Provider Order: No       Mobility Bed Mobility Overal bed mobility: Modified Independent      Transfers Overall transfer level: Needs assistance Equipment used: Rolling walker (2 wheels) Transfers: Sit to/from Stand Sit to Stand: Contact guard assist     General transfer comment: CGA for use of rollator, frequent cueing needed.     Balance Overall balance assessment: Needs assistance Sitting-balance support: Feet supported, No upper extremity supported Sitting balance-Leahy Scale: Good     Standing balance support: Bilateral upper extremity supported, During functional activity, Reliant on assistive device for balance Standing balance-Leahy Scale:  Poor Standing balance comment: BUE support         ADL either performed or assessed with clinical judgement   ADL Overall ADL's : Needs assistance/impaired     Grooming: Wash/dry hands;Sitting Grooming Details (indicate cue type and reason): D/t fatigue       Toilet Transfer: Contact guard assist;Ambulation;Regular Toilet;Grab bars;Rollator (4 wheels) Toilet Transfer Details (indicate cue type and reason): CGA for balance, cues for rollator Toileting- Clothing Manipulation and Hygiene: Supervision/safety;Sitting/lateral lean Toileting - Clothing Manipulation Details (indicate cue type and reason): Pt using lateral leans to complete hygiene seated on toilet     Functional mobility during ADLs: Contact guard assist;Rolling walker (2 wheels) General ADL Comments: Decreased activity tolerance    Extremity/Trunk Assessment Upper Extremity Assessment Upper Extremity Assessment: Generalized weakness   Lower Extremity Assessment Lower Extremity Assessment: Defer to PT evaluation        Vision   Vision Assessment?: No apparent visual deficits         Communication Communication Communication: No apparent difficulties   Cognition Arousal: Alert Behavior During Therapy: WFL for tasks assessed/performed Cognition: No apparent impairments       Following commands: Impaired Following commands impaired: Follows multi-step commands with increased time      Cueing   Cueing Techniques: Verbal cues        General Comments VSS on 1L O2.    Pertinent Vitals/ Pain       Pain Assessment Pain Assessment: No/denies pain Pain Intervention(s): Monitored during session   Frequency  Min 2X/week        Progress Toward Goals  OT Goals(current goals can now be found in the care plan section)  Progress towards OT goals: Progressing toward goals  Acute Rehab OT Goals Patient Stated Goal: To get procedure OT Goal Formulation: With  patient Time For Goal Achievement:  04/10/24 Potential to Achieve Goals: Good ADL Goals Pt Will Perform Grooming: with modified independence;standing Pt Will Perform Lower Body Dressing: with modified independence;sit to/from stand;sitting/lateral leans Pt Will Transfer to Toilet: with modified independence;ambulating;regular height toilet Pt Will Perform Toileting - Clothing Manipulation and hygiene: with modified independence;sitting/lateral leans;sit to/from stand Additional ADL Goal #1: Pt will independently verbalize 3 energy conservation strategies for ADL tasks  Plan         AM-PAC OT 6 Clicks Daily Activity     Outcome Measure   Help from another person eating meals?: None Help from another person taking care of personal grooming?: A Little Help from another person toileting, which includes using toliet, bedpan, or urinal?: A Little Help from another person bathing (including washing, rinsing, drying)?: A Little Help from another person to put on and taking off regular upper body clothing?: A Little Help from another person to put on and taking off regular lower body clothing?: A Little 6 Click Score: 19    End of Session Equipment Utilized During Treatment: Rollator (4 wheels)  OT Visit Diagnosis: Unsteadiness on feet (R26.81);Other abnormalities of gait and mobility (R26.89);Muscle weakness (generalized) (M62.81)   Activity Tolerance Patient tolerated treatment well   Patient Left in bed;with call bell/phone within reach;with bed alarm set   Nurse Communication Mobility status        Time: 8467-8368 OT Time Calculation (min): 59 min  Charges: OT General Charges $OT Visit: 1 Visit OT Treatments $Self Care/Home Management : 53-67 mins  Adrianne BROCKS, OT  Acute Rehabilitation Services Office [phone] Secure chat preferred   Adrianne GORMAN Savers 04/03/2024, 4:46 PM

## 2024-04-03 NOTE — Progress Notes (Addendum)
 PROGRESS NOTE    Jhalil Silvera Diviney  FMW:[id number] DOB: 05-01-1934 DOA: 03/25/2024 PCP: [name] LABOR, MD  89/M w systolic CHF, aortic stenosis, CAD, CKD, and bladder cancer sp TURP who presented with generalized weakness and fatigue.  In ED, vol overloaded, cr 2.2 , troponin 375 and 360 , Lactic acid 2.2 , hgb 7.2  Echo with reduced EF,  with signs of hypoperfusion.  Placed on milrinone  for inotropic support.  -9/19 off milrinone . Consulted structural heart team.  -9/22 limited echocardiogram with sever low flow gradient aortic stenosis.  - 9/23 right and left thoracentesis, 1.3 L drained on left and 1.4 on right - 9/24, structural heart team consult, plan for TAVR on 9/30, suggested to keep as inpatient  Subjective: Feels fair, no events overnight  Assessment and Plan:  Acute on chronic systolic CHF Severe MR, TR Severe LFLG Aortic stenosis -Echo w EF < 20%, RV with mod reduced, severe mitral valve regurgitation, moderate to severe tricuspid valve regurgitation, low flow severe aortic valve stenosis with moderate regurgitation, moderate pulmonary valve regurgitation,  - concern for low output on admission, started on milrinone  with diuretics -Weaned off milrinone  on 9/19  - GDMT limited by hypotension - CVP is low, diuretics on hold - Structural heart team consulting, plan for TAVR on Tuesday 9/30, high risk for deterioration outside the hospital - Palliative care following, DNR  Abnormal LFTs, shock liver Improving  AKI on CKD 3a - Baseline creatinine 1.3, peaked at 2.7 in the setting of cardiogenic shock - Improving, diuretics on hold now  Coronary artery disease History of CABG 2023, at bedtime troponin was 484, felt to be demand ischemia from CHF - Statin held for shock liver, aspirin  held with severe anemia  PAD (peripheral artery disease) Continue supportive medical care  Macrocytic anemia Thrombocytopenia.  - Could have underlying myeloproliferative  disorder, anemia and thrombocytopenia have been ongoing issues for at least 2 years - Also check B12 - Indirect bilirubin is normal, hemolysis less likely - Hemoglobin trending down further, transfuse 1 unit PRBC today  History of bladder cancer Follow up as outpatient.   DVT prophylaxis: SCDs Code Status: DNR Family Communication: none present Disposition Plan:   Consultants:    Procedures:   Antimicrobials:    Objective: Vitals:   04/03/24 0442 04/03/24 0446 04/03/24 0747 04/03/24 1121  BP:   110/69 95/61  Pulse:   100 90  Resp: 16  15 13   Temp:   97.7 F (36.5 C) (!) 97.5 F (36.4 C)  TempSrc:   Oral Oral  SpO2:   98% 91%  Weight:  57 kg    Height:        Intake/Output Summary (Last 24 hours) at 04/03/2024 1152 Last data filed at 04/03/2024 0600 Gross per 24 hour  Intake 373 ml  Output 1000 ml  Net -627 ml   Filed Weights   04/01/24 0329 04/02/24 0331 04/03/24 0446  Weight: 58.3 kg 56.3 kg 57 kg    Examination:  Gen: Awake, Alert, Oriented X 3, chronically ill-appearing, no distress HEENT: no JVD Lungs: Good air movement bilaterally, CTAB CVS: S1S2/RRR systolic murmur Abd: soft, Non tender, non distended, BS present Extremities: No edema, right arm PICC line Skin: no new rashes on exposed skin     Data Reviewed:   CBC: Recent Labs  Lab 03/28/24 0500 03/29/24 0600 03/31/24 0602 04/03/24 0434  WBC 4.9 4.8 6.6 5.7  HGB 7.2* 7.7* 8.1* 7.2*  HCT 21.9* 22.8* 24.3* 21.1*  MCV 104.3* 104.1* 105.7* 103.4*  PLT 76* 71* 80* 74*   Basic Metabolic Panel: Recent Labs  Lab 03/28/24 0500 03/29/24 0600 03/30/24 0500 03/31/24 0602 04/01/24 0650 04/02/24 0500 04/03/24 0434  NA 135   < > 131* 131* 130* 131* 130*  K 4.1   < > 4.1 4.7 5.0 4.2 4.1  CL 98   < > 101 101 98 99 97*  CO2 21*   < > 24 23 22 24 26   GLUCOSE 111*   < > 101* 108* 102* 125* 90  BUN 87*   < > 56* 55* 58* 60* 60*  CREATININE 2.35*   < > 1.76* 1.75* 1.68* 1.92* 1.87*  CALCIUM   8.3*   < > 8.1* 8.2* 8.4* 8.2* 7.9*  MG 2.1  --  1.9 1.9  --  1.8  --    < > = values in this interval not displayed.   GFR: Estimated Creatinine Clearance: 22.4 mL/min (A) (by C-G formula based on SCr of 1.87 mg/dL (H)). Liver Function Tests: Recent Labs  Lab 03/29/24 0600 04/01/24 0650 04/03/24 0434  AST 76* 38 26  ALT 158* 86* 50*  ALKPHOS 115 83 71  BILITOT 1.0 1.1 1.1  PROT 6.0* 5.9* 5.2*  ALBUMIN  2.8* 2.6* 2.3*   No results for input(s): LIPASE, AMYLASE in the last 168 hours.  No results for input(s): AMMONIA in the last 168 hours. Coagulation Profile: No results for input(s): INR, PROTIME in the last 168 hours. Cardiac Enzymes: No results for input(s): CKTOTAL, CKMB, CKMBINDEX, TROPONINI in the last 168 hours. BNP (last 3 results) No results for input(s): PROBNP in the last 8760 hours. HbA1C: No results for input(s): HGBA1C in the last 72 hours. CBG: Recent Labs  Lab 03/28/24 1046  GLUCAP 133*   Lipid Profile: No results for input(s): CHOL, HDL, LDLCALC, TRIG, CHOLHDL, LDLDIRECT in the last 72 hours. Thyroid  Function Tests: No results for input(s): TSH, T4TOTAL, FREET4, T3FREE, THYROIDAB in the last 72 hours. Anemia Panel: Recent Labs    04/02/24 0500  VITAMINB12 662   Urine analysis:    Component Value Date/Time   COLORURINE YELLOW 03/26/2024 1414   APPEARANCEUR CLEAR 03/26/2024 1414   LABSPEC 1.008 03/26/2024 1414   PHURINE 5.0 03/26/2024 1414   GLUCOSEU NEGATIVE 03/26/2024 1414   HGBUR SMALL (A) 03/26/2024 1414   BILIRUBINUR NEGATIVE 03/26/2024 1414   BILIRUBINUR large (A) 10/20/2022 1608   BILIRUBINUR negative 01/19/2022 0924   KETONESUR NEGATIVE 03/26/2024 1414   PROTEINUR NEGATIVE 03/26/2024 1414   UROBILINOGEN 4.0 (A) 10/20/2022 1608   UROBILINOGEN 0.2 11/28/2013 2032   NITRITE NEGATIVE 03/26/2024 1414   LEUKOCYTESUR MODERATE (A) 03/26/2024 1414   Sepsis  Labs: @LABRCNTIP (procalcitonin:4,lacticidven:4)  ) Recent Results (from the past 240 hours)  Resp panel by RT-PCR (RSV, Flu A&B, Covid) Anterior Nasal Swab     Status: None   Collection Time: 03/25/24  7:20 PM   Specimen: Anterior Nasal Swab  Result Value Ref Range Status   SARS Coronavirus 2 by RT PCR NEGATIVE NEGATIVE Final    Comment: (NOTE) SARS-CoV-2 target nucleic acids are NOT DETECTED.  The SARS-CoV-2 RNA is generally detectable in upper respiratory specimens during the acute phase of infection. The lowest concentration of SARS-CoV-2 viral copies this assay can detect is 138 copies/mL. A negative result does not preclude SARS-Cov-2 infection and should not be used as the sole basis for treatment or other patient management decisions. A negative result may occur with  improper specimen collection/handling, submission  of specimen other than nasopharyngeal swab, presence of viral mutation(s) within the areas targeted by this assay, and inadequate number of viral copies(<138 copies/mL). A negative result must be combined with clinical observations, patient history, and epidemiological information. The expected result is Negative.  Fact Sheet for Patients:  BloggerCourse.com  Fact Sheet for Healthcare Providers:  SeriousBroker.it  This test is no t yet approved or cleared by the [place]  FDA and  has been authorized for detection and/or diagnosis of SARS-CoV-2 by FDA under an Emergency Use Authorization (EUA). This EUA will remain  in effect (meaning this test can be used) for the duration of the COVID-19 declaration under Section 564(b)(1) of the Act, 21 U.S.C.section 360bbb-3(b)(1), unless the authorization is terminated  or revoked sooner.       Influenza A by PCR NEGATIVE NEGATIVE Final   Influenza B by PCR NEGATIVE NEGATIVE Final    Comment: (NOTE) The Xpert Xpress SARS-CoV-2/FLU/RSV plus assay is intended as  an aid in the diagnosis of influenza from Nasopharyngeal swab specimens and should not be used as a sole basis for treatment. Nasal washings and aspirates are unacceptable for Xpert Xpress SARS-CoV-2/FLU/RSV testing.  Fact Sheet for Patients: BloggerCourse.com  Fact Sheet for Healthcare Providers: SeriousBroker.it  This test is not yet approved or cleared by the [place]  FDA and has been authorized for detection and/or diagnosis of SARS-CoV-2 by FDA under an Emergency Use Authorization (EUA). This EUA will remain in effect (meaning this test can be used) for the duration of the COVID-19 declaration under Section 564(b)(1) of the Act, 21 U.S.C. section 360bbb-3(b)(1), unless the authorization is terminated or revoked.     Resp Syncytial Virus by PCR NEGATIVE NEGATIVE Final    Comment: (NOTE) Fact Sheet for Patients: BloggerCourse.com  Fact Sheet for Healthcare Providers: SeriousBroker.it  This test is not yet approved or cleared by the [place]  FDA and has been authorized for detection and/or diagnosis of SARS-CoV-2 by FDA under an Emergency Use Authorization (EUA). This EUA will remain in effect (meaning this test can be used) for the duration of the COVID-19 declaration under Section 564(b)(1) of the Act, 21 U.S.C. section 360bbb-3(b)(1), unless the authorization is terminated or revoked.  Performed at [facility], [address], [place], [place] [address]   Respiratory (~20 pathogens) panel by PCR     Status: None   Collection Time: 03/26/24  4:04 AM   Specimen: Nasopharyngeal Swab; Respiratory  Result Value Ref Range Status   Adenovirus NOT DETECTED NOT DETECTED Final   Coronavirus 229E NOT DETECTED NOT DETECTED Final    Comment: (NOTE) The Coronavirus on the Respiratory Panel, DOES NOT test for the novel  Coronavirus (2019 nCoV)     Coronavirus HKU1 NOT DETECTED NOT DETECTED Final   Coronavirus NL63 NOT DETECTED NOT DETECTED Final   Coronavirus OC43 NOT DETECTED NOT DETECTED Final   Metapneumovirus NOT DETECTED NOT DETECTED Final   Rhinovirus / Enterovirus NOT DETECTED NOT DETECTED Final   Influenza A NOT DETECTED NOT DETECTED Final   Influenza B NOT DETECTED NOT DETECTED Final   Parainfluenza Virus 1 NOT DETECTED NOT DETECTED Final   Parainfluenza Virus 2 NOT DETECTED NOT DETECTED Final   Parainfluenza Virus 3 NOT DETECTED NOT DETECTED Final   Parainfluenza Virus 4 NOT DETECTED NOT DETECTED Final   Respiratory Syncytial Virus NOT DETECTED NOT DETECTED Final   Bordetella pertussis NOT DETECTED NOT DETECTED Final   Bordetella Parapertussis NOT DETECTED NOT DETECTED Final   Chlamydophila pneumoniae  NOT DETECTED NOT DETECTED Final   Mycoplasma pneumoniae NOT DETECTED NOT DETECTED Final    Comment: Performed at [facility], [address], [place], [place] [address]     Radiology Studies: DG CHEST PORT 1 VIEW Result Date: 04/02/2024 CLINICAL DATA:  Bilateral pleural effusions. EXAM: PORTABLE CHEST 1 VIEW COMPARISON:  Radiograph and CT yesterday FINDINGS: Stable right upper extremity PICC tip in the SVC. Unchanged cardiomegaly post median sternotomy and CABG. Trace bilateral pleural effusions. Scattered atelectasis. Improving ground-glass opacities in the upper lung zones. No pneumothorax. IMPRESSION: 1. Trace bilateral pleural effusions. 2. Improving ground-glass opacities in the upper lung zones. Electronically Signed   By: Andrea Gasman M.D.   On: 04/02/2024 12:50   CT ANGIO CHEST AORTA W/CM & OR WO/CM Result Date: 04/01/2024 CLINICAL DATA:  Preoperative evaluation for aortic valve replacement. History of bladder cancer. * Tracking Code: BO * EXAM: CT ANGIOGRAPHY CHEST, ABDOMEN AND PELVIS TECHNIQUE: Multidetector CT imaging through the chest, abdomen and pelvis was performed using the standard protocol during  bolus administration of intravenous contrast. Multiplanar reconstructed images and MIPs were obtained and reviewed to evaluate the vascular anatomy. RADIATION DOSE REDUCTION: This exam was performed according to the departmental dose-optimization program which includes automated exposure control, adjustment of the mA and/or kV according to patient size and/or use of iterative reconstruction technique. CONTRAST:  OMNIPAQUE  IOHEXOL  350 MG/ML SOLN COMPARISON:  CT abdomen and pelvis dated 10/19/2022 FINDINGS: CTA CHEST FINDINGS Cardiovascular: Right upper extremity PICC tip terminates in the lower SVC. Preferential opacification of the thoracic aorta. No evidence of thoracic aortic aneurysm or dissection. Irregular noncalcified atherosclerotic plaque without substantial luminal narrowing along the descending thoracic aorta. No pericardial effusion. Coronary artery calcifications. Reflux of contrast material into the hepatic veins, suggesting a degree of right heart dysfunction. Please see separately dictated cardiac CT report for detailed findings. Mediastinum/Nodes: Imaged thyroid  gland without nodules meeting criteria for imaging follow-up by size. Normal esophagus. No pathologically enlarged axillary, supraclavicular, mediastinal, or hilar lymph nodes. Lungs/Pleura: The central airways are patent. Mild interlobular septal thickening with interspersed areas of ground-glass opacities in the bilateral upper lobes. Near-complete relaxation atelectasis of the bilateral lower lobes. No pneumothorax. Pleural effusions. Musculoskeletal: Median sternotomy wires are nondisplaced. No acute or abnormal lytic or blastic osseous lesions. Review of the MIP images confirms the above findings. CTA ABDOMEN AND PELVIS FINDINGS VASCULAR Aorta: Aortic atherosclerosis. Irregular noncalcified atherosclerotic plaque results in moderate luminal narrowing of the juxtarenal and infrarenal abdominal aorta. No aneurysm or dissection.  Celiac: Patent without evidence of aneurysm, dissection, vasculitis or significant stenosis. Segmental mild to moderate luminal narrowing of the splenic artery due to calcified plaque. SMA: Segmental mild-to-moderate luminal narrowing of the SMA due to atherosclerotic plaque. Replaced hepatic artery arises from the SMA. Renals: Single bilateral renal arteries. Mild luminal narrowing of the proximal arteries due to atherosclerotic plaque. IMA: Severe luminal narrowing of the IMA origin. Inflow: Patent without evidence of aneurysm, dissection, vasculitis or significant stenosis. Proximal Outflow: Bilateral common femoral and visualized portions of the superficial and profunda femoral arteries are patent without evidence of aneurysm, dissection, vasculitis or significant stenosis. Veins: No obvious venous abnormality within the limitations of this arterial phase study. Review of the MIP images confirms the above findings. NON-VASCULAR Hepatobiliary: No focal hepatic lesions. No intra or extrahepatic biliary ductal dilation. Normal gallbladder. Pancreas: No focal lesions or main ductal dilation. Spleen: Normal in size without focal abnormality. Adrenals/Urinary Tract: No adrenal nodules. Again seen are multiple  bilateral renal cysts, including peripherally calcified irregular structure arising exophytically from the lower pole left kidney with an adjacent intermediate attenuation cyst (6:132). Some of the cysts are simple while others are hemorrhagic/proteinaceous. No hydronephrosis or calculi. Underdistended urinary bladder contains anti dependent intraluminal gas as well as multiple scattered punctate foci of gas along the posterior urinary bladder. There is irregular mural thickening along the right bladder wall with a small nodular focus protruding into the bladder lumen measuring 7 mm (6:177). A mass in this area previously measured 2.1 cm. Stomach/Bowel: Normal appearance of the stomach. No evidence of bowel wall  thickening, distention, or inflammatory changes. Appendix is not discretely seen. Lymphatic: No enlarged abdominal or pelvic lymph nodes. Reproductive: Prostate is unremarkable. Other: Small volume presacral free fluid. No free air or fluid collection. Musculoskeletal: No acute or abnormal lytic or blastic osseous lesions. Diffuse body wall edema. Review of the MIP images confirms the above findings. IMPRESSION: 1. Irregular noncalcified atherosclerotic plaque results in moderate luminal narrowing of the juxtarenal and infrarenal abdominal aorta. No aneurysm or dissection. 2. Mild interlobular septal thickening with interspersed areas of ground-glass opacities in the bilateral upper lobes, likely pulmonary edema. 3. Bilateral large pleural effusions with near-complete relaxation atelectasis of the bilateral lower lobes. 4. Irregular mural thickening along the right bladder wall with a small nodular focus protruding into the bladder lumen measuring 7 mm. A mass in this area previously measured 2.1 cm. Findings are consistent with known bladder cancer. 5. Intraluminal gas within the urinary bladder including scattered punctate foci along the posterior bladder wall. 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 6. Multiple bilateral renal cysts, some of which are simple while others are hemorrhagic/proteinaceous. These are not substantially changed in size compared to 10/19/2022 and better evaluated on prior CT dated 11/29/2022. 7. Reflux of contrast material into the hepatic veins, suggesting a degree of right heart dysfunction. 8.  Aortic Atherosclerosis (ICD10-I70.0). Electronically Signed   By: Limin  Xu M.D.   On: 04/01/2024 18:28   CT Angio Abd/Pel w/ and/or w/o Result Date: 04/01/2024 CLINICAL DATA:  Preoperative evaluation for aortic valve replacement. History of bladder cancer. * Tracking Code: BO * EXAM: CT  ANGIOGRAPHY CHEST, ABDOMEN AND PELVIS TECHNIQUE: Multidetector CT imaging through the chest, abdomen and pelvis was performed using the standard protocol during bolus administration of intravenous contrast. Multiplanar reconstructed images and MIPs were obtained and reviewed to evaluate the vascular anatomy. RADIATION DOSE REDUCTION: This exam was performed according to the departmental dose-optimization program which includes automated exposure control, adjustment of the mA and/or kV according to patient size and/or use of iterative reconstruction technique. CONTRAST:  OMNIPAQUE  IOHEXOL  350 MG/ML SOLN COMPARISON:  CT abdomen and pelvis dated 10/19/2022 FINDINGS: CTA CHEST FINDINGS Cardiovascular: Right upper extremity PICC tip terminates in the lower SVC. Preferential opacification of the thoracic aorta. No evidence of thoracic aortic aneurysm or dissection. Irregular noncalcified atherosclerotic plaque without substantial luminal narrowing along the descending thoracic aorta. No pericardial effusion. Coronary artery calcifications. Reflux of contrast material into the hepatic veins, suggesting a degree of right heart dysfunction. Please see separately dictated cardiac CT report for detailed findings. Mediastinum/Nodes: Imaged thyroid  gland without nodules meeting criteria for imaging follow-up by size. Normal esophagus. No pathologically enlarged axillary, supraclavicular, mediastinal, or hilar lymph nodes. Lungs/Pleura: The central airways are patent. Mild interlobular septal thickening with interspersed areas of ground-glass opacities in the bilateral upper lobes. Near-complete relaxation atelectasis  of the bilateral lower lobes. No pneumothorax. Pleural effusions. Musculoskeletal: Median sternotomy wires are nondisplaced. No acute or abnormal lytic or blastic osseous lesions. Review of the MIP images confirms the above findings. CTA ABDOMEN AND PELVIS FINDINGS VASCULAR Aorta: Aortic atherosclerosis.  Irregular noncalcified atherosclerotic plaque results in moderate luminal narrowing of the juxtarenal and infrarenal abdominal aorta. No aneurysm or dissection. Celiac: Patent without evidence of aneurysm, dissection, vasculitis or significant stenosis. Segmental mild to moderate luminal narrowing of the splenic artery due to calcified plaque. SMA: Segmental mild-to-moderate luminal narrowing of the SMA due to atherosclerotic plaque. Replaced hepatic artery arises from the SMA. Renals: Single bilateral renal arteries. Mild luminal narrowing of the proximal arteries due to atherosclerotic plaque. IMA: Severe luminal narrowing of the IMA origin. Inflow: Patent without evidence of aneurysm, dissection, vasculitis or significant stenosis. Proximal Outflow: Bilateral common femoral and visualized portions of the superficial and profunda femoral arteries are patent without evidence of aneurysm, dissection, vasculitis or significant stenosis. Veins: No obvious venous abnormality within the limitations of this arterial phase study. Review of the MIP images confirms the above findings. NON-VASCULAR Hepatobiliary: No focal hepatic lesions. No intra or extrahepatic biliary ductal dilation. Normal gallbladder. Pancreas: No focal lesions or main ductal dilation. Spleen: Normal in size without focal abnormality. Adrenals/Urinary Tract: No adrenal nodules. Again seen are multiple bilateral renal cysts, including peripherally calcified irregular structure arising exophytically from the lower pole left kidney with an adjacent intermediate attenuation cyst (6:132). Some of the cysts are simple while others are hemorrhagic/proteinaceous. No hydronephrosis or calculi. Underdistended urinary bladder contains anti dependent intraluminal gas as well as multiple scattered punctate foci of gas along the posterior urinary bladder. There is irregular mural thickening along the right bladder wall with a small nodular focus protruding into the  bladder lumen measuring 7 mm (6:177). A mass in this area previously measured 2.1 cm. Stomach/Bowel: Normal appearance of the stomach. No evidence of bowel wall thickening, distention, or inflammatory changes. Appendix is not discretely seen. Lymphatic: No enlarged abdominal or pelvic lymph nodes. Reproductive: Prostate is unremarkable. Other: Small volume presacral free fluid. No free air or fluid collection. Musculoskeletal: No acute or abnormal lytic or blastic osseous lesions. Diffuse body wall edema. Review of the MIP images confirms the above findings. IMPRESSION: 1. Irregular noncalcified atherosclerotic plaque results in moderate luminal narrowing of the juxtarenal and infrarenal abdominal aorta. No aneurysm or dissection. 2. Mild interlobular septal thickening with interspersed areas of ground-glass opacities in the bilateral upper lobes, likely pulmonary edema. 3. Bilateral large pleural effusions with near-complete relaxation atelectasis of the bilateral lower lobes. 4. Irregular mural thickening along the right bladder wall with a small nodular focus protruding into the bladder lumen measuring 7 mm. A mass in this area previously measured 2.1 cm. Findings are consistent with known bladder cancer. 5. Intraluminal gas within the urinary bladder including scattered punctate foci along the posterior bladder wall. 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 6. Multiple bilateral renal cysts, some of which are simple while others are hemorrhagic/proteinaceous. These are not substantially changed in size compared to 10/19/2022 and better evaluated on prior CT dated 11/29/2022. 7. Reflux of contrast material into the hepatic veins, suggesting a degree of right heart dysfunction. 8.  Aortic Atherosclerosis (ICD10-I70.0). Electronically Signed   By: Limin  Xu M.D.   On: 04/01/2024 18:28   DG CHEST PORT 1 VIEW Result  Date: 04/01/2024 CLINICAL DATA:  Pleural effusion.  Post bilateral thoracentesis. EXAM: PORTABLE CHEST 1 VIEW COMPARISON:  Chest radiograph 03/25/2024.  CT chest 04/01/2024 FINDINGS: Postoperative changes in the mediastinum. Mild cardiac enlargement. No vascular congestion or edema. Minimal bilateral or pleural effusions representing interval decreased since prior CT post thoracentesis. No pneumothorax. Mediastinal contours appear intact. A right PICC line is in place with tip projecting over the low SVC region. Calcification in the aorta. Probable emphysematous changes in the lungs. Peribronchial thickening with perihilar infiltrates likely representing chronic bronchitis. IMPRESSION: 1. Minimal if any residual pleural effusions post thoracentesis. No pneumothorax. 2. Mild cardiac enlargement. 3. 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 Electronically Signed   By: Elsie Gravely M.D.   On: 04/01/2024 18:17   CT CORONARY MORPH W/CTA COR W/SCORE W/CA W/CM &/OR WO/CM Addendum Date: 04/01/2024 ADDENDUM REPORT: 04/01/2024 14:26 EXAM: OVER-READ INTERPRETATION  CT CHEST The following report is an over-read performed by radiologist Dr. Marcey Diones [facility] on 04/01/2024. This over-read does not include interpretation of cardiac or coronary anatomy or pathology. The coronary CTA interpretation by the cardiologist is attached. COMPARISON:  None. FINDINGS: The heart size is within normal limits. No pericardial fluid is identified. Visualized segments of the thoracic aorta and central pulmonary arteries are normal in caliber. Visualized mediastinum and hilar regions demonstrate no lymphadenopathy or masses. Pulmonary edema with moderate bilateral pleural effusions. Associated compressive atelectasis of both lower lobes. Visualized no pulmonary consolidation, pneumothorax or focal nodule. Visualized upper abdomen and bony structures are unremarkable. IMPRESSION: Pulmonary edema with moderate  bilateral pleural effusions. Electronically Signed   By: Marcey Moan M.D.   On: 04/01/2024 14:26   Result Date: 04/01/2024 CLINICAL DATA:  Aortic valve replacement (TAVR), pre-op eval [id number] Severe aortic stenosis [id number] EXAM: Cardiac TAVR CT TECHNIQUE: The patient was scanned on a Siemens Force 192 slice scanner. A 120 kV retrospective scan was triggered in the descending thoracic aorta at 111 HU's. Gantry rotation speed was 270 msecs and collimation was .9 mm. The 3D data set was reconstructed in 5% intervals of the R-R cycle. Systolic and diastolic phases were analyzed on a dedicated work station using MPR, MIP and VRT modes. The patient received OMNIPAQUE  IOHEXOL  350 MG/ML SOLN of contrast. 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 Severely thickened and severely calcified aortic valve cusps. AV calcium  score: 1859 Virtual Basal Annulus Measurements: Maximum/Minimum Diameter: 27.5 x 20.2 mm Perimeter: 74.8 mm Area:  423 mm2 No significant LVOT calcifications. Membranous septal length: 8.4 mm Based on these measurements, the annulus would be suitable for a 23 mm Sapien 3 valve. Alternatively, Heart Team can consider 29 mm Evolut valve. Recommend Heart Team discussion for valve selection. Sinus of Valsalva Measurements: Non-coronary:  35 mm Right - coronary:  34 mm Left - coronary:  34 mm Coronary height and sinus of Valsalva Height: Left main: 22 mm, Left sinus: 26 mm Right coronary: 21 mm, Right sinus: 26 mm Aorta: Common origin of the brachiocephalic and left common carotid artery. Severe aortic atherosclerosis with very severe atheromatous plaque in the infrarenal abdominal aorta. Sinotubular Junction:  31 mm Ascending Thoracic Aorta:  37 mm Aortic Arch:  30 mm Descending Thoracic Aorta:  27 mm Coronary Arteries: Normal coronary origin. Right dominance. The study was performed without use of NTG and insufficient for plaque evaluation. 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 RIMA-LAD patent. LIMA-OM1 patent. SVG to OM2 appears occluded at the aortic anastomosis. Optimum Fluoroscopic Angle for Delivery:  LAO 5 CAU 3 OTHER: Large bilateral pleural effusions. Atria: Biatrial dilation. Left atrial appendage: No thrombus. Mitral valve: Grossly normal, mild mitral annular calcifications. Pulmonary artery: Normal caliber. Pulmonary veins: Normal anatomy. IMPRESSION: 1. 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 Severely thickened and severely calcified aortic valve cusps. 2. Aortic valve calcium  score: 1859 3. Annulus area: 423 mm2, suitable for 23 mm Sapien 3 valve. No LVOT calcifications. Membranous septal length 8.4 mm. 4. Sufficient coronary artery heights from annulus. 5. Optimum fluoroscopic angle for delivery: LAO 5 CAU 3 6. Patent RIMA-LAD, LIMA-OM1.  Occluded SVG to OM2. 7. Severe aortic atherosclerosis with very severe atheromatous plaque in the infrarenal abdominal aorta. 8. Large bilateral pleural effusions. Electronically Signed: By: Soyla Merck M.D. On: 04/01/2024 14:07     Scheduled Meds:  sodium chloride    Intravenous Once   Chlorhexidine  Gluconate Cloth  6 each Topical Daily   Influenza vac split trivalent PF  0.5 mL Intramuscular Tomorrow-1000   melatonin  3 mg Oral QHS   rosuvastatin   10 mg Oral Daily   sodium chloride  flush  3 mL Intravenous Q12H   Continuous Infusions:   LOS: 8 days    Time spent:    Sigurd Pac, MD [facility]   04/03/2024, 11:52 AM

## 2024-04-03 NOTE — H&P (View-Only) (Signed)
 [facility]   MULTIDISCIPLINARY HEART VALVE TEAM  Cardiothoracic Surgery Consultation:   Patient ID: KILEY TORRENCE MRN: [id number]; DOB: [birth year]  Admit date: 03/25/2024 Date of Consult: 04/03/2024  Primary Care Provider: [name] LABOR, MD [facility] Cardiologist: None  [facility] Electrophysiologist:  None    Patient Profile:   KEAGAN BRISLIN is a 89 y.o. male with a hx of CAD w/ prior NSTEMI 07/2021 s/p CABG (RIMA to LAD, LIMA to OM1, SVG to OM2), ischemic cardiomyopathy, hx CVA, PAD, bladder cancer, CKD IIIa, LBBB and aortic valve stenosis admitted with cardiogenic shock who is being seen today for the evaluation of severe LFLG AS at the request of Dr. Wendel  History of Present Illness:   EF previously 40-45% on LV gram at time of cath/NSTEMI in 07/2021. S/p CABG (RIMA to LAD, LIMA to OM1, SVG to OM2) by Dr. Lucas. Aortic stenosis noted to be mild at that time. EF subsequently normalized.   Last echo 12/2023 showed EF 50-55%, RWMA w/ basal inferior and basal inferoseptal severe HK, RV okay, moderate AS-severe severely calcified valve with mean gradient of 23 mmHg and AVA 1.1 cm2 by VTI.   Mr. Tomas lives at home with his wife who suffers from dementia and is her primary caregiver. He was previously very independent driving, grocery shopping and doing day to day ADLs without difficulty. Over the past several weeks he has suffered a rapid decline with worsening dyspnea, weakness, anorexia and abdominal bloating. He presented to the ED 03/25/24 where labs were significant for: Scr 2.2, K 5.4, Na 133, lipase 100, AST 205, ALT 207, HS troponin 484, lactic acid 2.2>2.3>1.9, hgb 7.2, BNP > 4500. ECG with SR, known LBBB w/ QRS > 150 ms, PACs. FOBT negative. Abdominal US  with evidence of biliary sludge and gallbladder wall thickening. He was given IVF, tylenol  and lokelma .   He was admitted for evaluation and management of acute on chronic HFpEF, acute on  chronic anemia, AKI on CKD IIIb and elevated LFTs. 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 Advanced Heart Failure asked to see to assist with management. Repeat TTE 03/26/24 w/ LVEF <20%, G2DD, moderate RV dysfunction, severe MR, mod-severe TR, moderate PR, moderate pleural effusion in lateral region, and severe LFLG aortic stenosis with mean gradient 17 mm hg, Vmax 2.85 m/s, AVA 0.77cm2, and moderate AI. He was eventually weaned off milrinone  and diuresed to euvolemia. Repeat limited echo 03/31/24 showed EF 20%, LV with GHK, RV mildly reduced (improved), LA mildly dilated, mod MR, AV mean gradient 19 mmHg, severe aortic stenosis.  Structural heart consulted for consideration of TAVR. His creat improved and we were able to get pre TAVR CT scans which showed anatomical characteristics consistent with aortic stenosis suitable for treatment by transcatheter aortic valve replacement without any significant complicating features and adequate pelvic vascular access to facilitate a transfemoral approach. Cardiac CT demonstrated patent RIMA-LAD and LIMA-OM1 as well as an occluded SVG-->OM which was not felt to explain his cardiomyopathy. Dr. Wendel did not think that coronary angiography was necessay as it would likely not alter management. Large bilateral pleural effusions were noted on CTs and he is now s/p -1.3L on the L and -1.4L on the R thoracentesis 04/01/24 with marked symptomatic improvement. LFTs have improved and creat stable around 1.87. Hg 7.2 today and being transfused.   Cardiothoracic surgery consulted as a multidisciplinary approach to his care.      Past Medical  History:  Diagnosis Date   (HFpEF) heart failure with preserved ejection fraction (HCC)    Echo 1/23: Inferior AK, mild aortic stenosis (mean 9 mmHg, V-max 210.5 cm/s, DI 0.70), EF 55-60, GR 1 DD, normal RVSF, normal PASP, trivial MR, mild AI, borderline dilation of aortic root (39  mm)   Acid reflux    hiatal hernia   Anemia    Bladder cancer (HCC)    CAD (coronary artery disease)    S/p non-STEMI 1/23 >> s/p CABG   Carotid artery disease 08/23/2021   Pre-CABG Dopplers 1/23:R ICA 40-59; L ICA 1-39 // Carotid US  07/20/2023: RICA 40-59; LICA 1-39   CHF (congestive heart failure) (HCC)    Chronic kidney disease (CKD)    Coronary artery disease involving native coronary artery of native heart without angina pectoris 05/16/2022   Inf NSTEMI s/p CABG in 07/2021 (RIMA-LAD, LIMA-OM1, S-OM2)   Diverticulitis    Erectile dysfunction 04/20/2015   Essential hypertension 04/20/2015   History of colon polyps    History of kidney stones    History of stroke    Noted on brain MRI 07/2021   Hyperlipidemia 05/28/2015   PAD (peripheral artery disease)    Pre-CABG Dopplers 1/23: Right ABI 0.65; left ABI 0.82   Skin cancer    Stroke [facility])    noted MRI 2023 pt. unaware    Past Surgical History:  Procedure Laterality Date   APPENDECTOMY     cataract surgery     CORONARY ARTERY BYPASS GRAFT N/A 08/01/2021   Procedure: CORONARY ARTERY BYPASS GRAFTING (CABG) X 3  ,ON PUMP, USING LEFT AND RIGHT INTERNAL MAMMARY ARTERIES, RIGHT AND LEFT ENDOSCOPIC GREATER SAPHENOUS VEIN CONDUITS;  Surgeon: Lucas Dorise POUR, MD;  Location: [facility] OR;  Service: Open Heart Surgery;  Laterality: N/A;   ENDOVEIN HARVEST OF GREATER SAPHENOUS VEIN Right 08/01/2021   Procedure: ENDOVEIN HARVEST OF GREATER SAPHENOUS VEIN;  Surgeon: Lucas Dorise POUR, MD;  Location: [facility] OR;  Service: Open Heart Surgery;  Laterality: Right;   LEFT HEART CATH AND CORONARY ANGIOGRAPHY N/A 07/27/2021   Procedure: LEFT HEART CATH AND CORONARY ANGIOGRAPHY;  Surgeon: Darron Deatrice LABOR, MD;  Location: [facility] INVASIVE CV LAB;  Service: Cardiovascular;  Laterality: N/A;   SKIN CANCER EXCISION     TEE WITHOUT CARDIOVERSION N/A 08/01/2021   Procedure: TRANSESOPHAGEAL ECHOCARDIOGRAM (TEE);  Surgeon: Lucas Dorise POUR, MD;  Location: [facility] OR;  Service: Open  Heart Surgery;  Laterality: N/A;   TRANSURETHRAL RESECTION OF BLADDER TUMOR N/A 01/03/2023   Procedure: TRANSURETHRAL RESECTION OF BLADDER TUMOR (TURBT);  Surgeon: Devere Lonni Righter, MD;  Location: [facility] ORS;  Service: Urology;  Laterality: N/A;  90 MINUTES   TRANSURETHRAL RESECTION OF BLADDER TUMOR N/A 10/10/2023   Procedure: TURBT (TRANSURETHRAL RESECTION OF BLADDER TUMOR);  Surgeon: Devere Lonni Righter, MD;  Location: [facility] ORS;  Service: Urology;  Laterality: N/A;   TRANSURETHRAL RESECTION OF PROSTATE       Home Medications:  Prior to Admission medications   Medication Sig Start Date End Date Taking? Authorizing Provider  aspirin  EC 81 MG tablet Take 1 tablet (81 mg total) by mouth daily. Swallow whole. 11/06/23  Yes Weaver, Scott T, PA-C  nitroGLYCERIN  (NITROSTAT ) 0.4 MG SL tablet Place 1 tablet (0.4 mg total) under the tongue every 5 (five) minutes as needed for chest pain. 05/16/22 03/26/25 Yes Weaver, Scott T, PA-C  rosuvastatin  (CRESTOR ) 20 MG tablet Take 1 tablet (20 mg total) by mouth daily. 01/22/24  Yes Lelon Hamilton T, PA-C  oxybutynin  (DITROPAN ) 5 MG tablet Take 1 tablet (5 mg total) by mouth every 8 (eight) hours as needed for bladder spasms. Patient not taking: Reported on 03/26/2024 10/10/23   Devere Lonni Righter, MD  phenazopyridine  (PYRIDIUM ) 100 MG tablet Take 1 tablet (100 mg total) by mouth 3 (three) times daily as needed for pain. Patient not taking: Reported on 03/26/2024 10/10/23 10/09/24  Devere Lonni Righter, MD    Inpatient Medications: Scheduled Meds:  Chlorhexidine  Gluconate Cloth  6 each Topical Daily   Influenza vac split trivalent PF  0.5 mL Intramuscular Tomorrow-1000   melatonin  3 mg Oral QHS   rosuvastatin   10 mg Oral Daily   sodium chloride  flush  3 mL Intravenous Q12H   Continuous Infusions:  PRN Meds: acetaminophen , guaiFENesin -dextromethorphan , lip balm, mouth rinse, senna, sodium chloride  flush, trimethobenzamide   Allergies:     Allergies  Allergen Reactions   Lipitor [Atorvastatin]     Muscle soreness    Social History:   Social History   Socioeconomic History   Marital status: Married    Spouse name: Not on file   Number of children: 1   Years of education: 12   Highest education level: Not on file  Occupational History   Occupation: Retired  Tobacco Use   Smoking status: Former    Types: Cigars   Smokeless tobacco: Never   Tobacco comments:    Quit 2016  Vaping Use   Vaping status: Never Used  Substance and Sexual Activity   Alcohol use: Yes    Comment: Occasional beer   Drug use: No   Sexual activity: Not Currently  Other Topics Concern   Not on file  Social History Narrative   Lives with wife and daughter lives with them also/2025   Caffeine use: 1-2 cups coffee in the am and 1 glass tea qpm   Social Drivers of Health   Financial Resource Strain: Medium Risk (11/23/2023)   Overall Financial Resource Strain (CARDIA)    Difficulty of Paying Living Expenses: Somewhat hard  Food Insecurity: No Food Insecurity (03/26/2024)   Hunger Vital Sign    Worried About Running Out of Food in the Last Year: Never true    Ran Out of Food in the Last Year: Never true  Transportation Needs: No Transportation Needs (03/26/2024)   PRAPARE - Administrator, Civil Service (Medical): No    Lack of Transportation (Non-Medical): No  Physical Activity: Inactive (11/23/2023)   Exercise Vital Sign    Days of Exercise per Week: 0 days    Minutes of Exercise per Session: 0 min  Stress: No Stress Concern Present (11/23/2023)   [employer] - Occupational Stress Questionnaire    Feeling of Stress : Only a little  Social Connections: Moderately Isolated (03/26/2024)   Social Connection and Isolation Panel    Frequency of Communication with Friends and Family: Once a week    Frequency of Social Gatherings with Friends and Family: Never    Attends Religious Services: 1 to 4  times per year    Active Member of [employer] or Organizations: No    Attends Banker Meetings: Never    Marital Status: Married  Catering manager Violence: Not At Risk (03/26/2024)   Humiliation, Afraid, Rape, and Kick questionnaire    Fear of Current or Ex-Partner: No    Emotionally Abused: No    Physically Abused: No    Sexually Abused: No    Family History:  Family History  Problem Relation Age of Onset   Stroke Father 52   Sudden death Mother 46       unknown cause     ROS:  Please see the history of present illness.  All other ROS reviewed and negative.     Physical Exam/Data:   Vitals:   04/03/24 1152 04/03/24 1155 04/03/24 1200 04/03/24 1250  BP: (!) 99/57 (!) 99/57 106/65 106/65  Pulse: 77 77 74 74  Resp: 13 13 16    Temp: (!) 97.5 F (36.4 C) (!) 97.5 F (36.4 C)    TempSrc: Oral Oral    SpO2: 100% 100% 99%   Weight:      Height:        Intake/Output Summary (Last 24 hours) at 04/03/2024 1334 Last data filed at 04/03/2024 1246 Gross per 24 hour  Intake 610 ml  Output 1300 ml  Net -690 ml      04/03/2024    4:46 AM 04/02/2024    3:31 AM 04/01/2024    3:29 AM  Last 3 Weights  Weight (lbs) 125 lb 10.6 oz 124 lb 1.9 oz 128 lb 8.5 oz  Weight (kg) 57 kg 56.3 kg 58.3 kg     Body mass index is 21.57 kg/m.  General:  thin white male HEENT: normal Lymph: no adenopathy Neck: no JV Cardiac:  normal S1, S2; RRR; 3/6 SEM.  Lungs:  clear to auscultation bilaterally, no wheezing, rhonchi or rales  Abd: soft, nontender, no hepatomegaly  Ext: no edema Musculoskeletal:  No deformities, BUE and BLE strength normal and equal Skin: warm and dry  Neuro:  CNs 2-12 intact, no focal abnormalities noted Psych:  Normal affect   EKG:  The EKG was personally reviewed and demonstrates:  Normal sinus rhythm with sinus arrhythmia, LAD, LBBB, HR 77 Left bundle branch block   Cardiac Studies & Procedures    ______________________________________________________________________________________________ CARDIAC CATHETERIZATION  CARDIAC CATHETERIZATION 07/27/2021  Conclusion   Prox RCA lesion is 100% stenosed.   1st Mrg lesion is 90% stenosed.   Mid Cx to Dist Cx lesion is 90% stenosed.   1st Diag lesion is 95% stenosed.   2nd Diag-1 lesion is 80% stenosed.   2nd Diag-2 lesion is 85% stenosed.   Mid LAD lesion is 85% stenosed.   There is mild left ventricular systolic dysfunction.   LV end diastolic pressure is normal.  1.  Moderately calcified coronary arteries with severe three-vessel coronary artery disease.  The culprit for myocardial infarction is likely occluded proximal right coronary artery.  The onset of occlusion occlusion is likely more than 12 hours with no chest pain. 2.  Mildly reduced LV systolic function with an EF of 40 to 45% with normal left ventricular end-diastolic pressure.  There is evidence of basal to mid inferior wall hypokinesis.  Recommendations: Given diffuse three-vessel coronary artery disease, I think the best option for revascularization is CABG. Resume heparin  in 4 hours at 7 PM.  Multivessel PCI to the LAD, OM1 and left circumflex is also possible if the patient is deemed to be too high risk for CABG.  Findings Coronary Findings Diagnostic  Dominance: Right  Left Main The vessel exhibits minimal luminal irregularities.  Left Anterior Descending Mid LAD lesion is 85% stenosed.  First Diagonal Branch Vessel is large in size. 1st Diag lesion is 95% stenosed.  Second Diagonal Branch Vessel is large in size. 2nd Diag-1 lesion is 80% stenosed. 2nd Diag-2 lesion is 85% stenosed.  Left Circumflex  Mid Cx to Dist Cx lesion is 90% stenosed.  First Obtuse Marginal Branch 1st Mrg lesion is 90% stenosed.  Second Obtuse Marginal Branch Vessel is small in size.  Right Coronary Artery Prox RCA lesion is 100% stenosed.  Right Posterior Descending  Artery Collaterals RPDA filled by collaterals from Dist LAD.  Third Right Posterolateral Branch Collaterals 3rd RPL filled by collaterals from Dist Cx.  Intervention  No interventions have been documented.   STRESS TESTS  NM MYOCAR MULTI W/SPECT W 10/24/2005   ECHOCARDIOGRAM  ECHOCARDIOGRAM LIMITED 03/31/2024  Narrative TRANSESOPHOGEAL ECHO REPORT    Patient Name:   GHASSAN COGGESHALL Date of Exam: 03/31/2024 Medical Rec #:  [id number]      Height:       64.0 in Accession #:    [id number]     Weight:       128.5 lb Date of Birth:  02-16-1934     BSA:          1.621 m Patient Age:    89 years       BP:           123/66 mmHg Patient Gender: M              HR:           79 bpm. Exam Location:  Inpatient  Procedure: Limited Echo, Color Doppler and Cardiac Doppler (Both Spectral and Color Flow Doppler were utilized during procedure).  Indications:    Aortic Stenosis  History:        Patient has prior history of Echocardiogram examinations, most recent 03/26/2024.  Sonographer:    Tinnie Gosling RDCS Referring Phys: [id number] GLENDIA DASEN WEAVER  IMPRESSIONS   1. Left ventricular ejection fraction, by estimation, is 20%. The left ventricle has severely decreased function. The left ventricle demonstrates global hypokinesis. The left ventricular internal cavity size was mildly dilated. No LV thrombus noted. 2. Right ventricular systolic function is mildly reduced. The right ventricular size is normal. Tricuspid regurgitation signal is inadequate for assessing PA pressure. 3. Left atrial size was mildly dilated. 4. The mitral valve is degenerative. Moderate mitral valve regurgitation. No evidence of mitral stenosis. Moderate mitral annular calcification. 5. The aortic valve is tricuspid. There is severe calcifcation of the aortic valve. Aortic valve regurgitation is mild. Aortic valve mean gradient measures 19.0 mmHg. Visually, I suspect low flow/low gradient severe aortic stenosis. However,  no LVOT VTI measurement was made, unable to calculate aortic valve area. Would suggest repeat study with fully interrogation of the aortic valve. 6. The inferior vena cava is normal in size with <50% respiratory variability, suggesting right atrial pressure of 8 mmHg. 7. Left pleural effusion present.  FINDINGS Left Ventricle: Left ventricular ejection fraction, by estimation, is 20%. The left ventricle has severely decreased function. The left ventricle demonstrates global hypokinesis. The left ventricular internal cavity size was mildly dilated.  Right Ventricle: The right ventricular size is normal. No increase in right ventricular wall thickness. Right ventricular systolic function is mildly reduced. Tricuspid regurgitation signal is inadequate for assessing PA pressure.  Left Atrium: Left atrial size was mildly dilated.  Right Atrium: Right atrial size was normal in size.  Pericardium: Left pleural effusion present. There is no evidence of pericardial effusion.  Mitral Valve: The mitral valve is degenerative in appearance. There is mild calcification of the mitral valve leaflet(s). Moderate mitral annular calcification. Moderate mitral valve regurgitation. No evidence of mitral valve stenosis.  Tricuspid Valve: The tricuspid valve  is not assessed.  Aortic Valve: The aortic valve is tricuspid. There is severe calcifcation of the aortic valve. Aortic valve regurgitation is mild. Aortic valve mean gradient measures 19.0 mmHg. Aortic valve peak gradient measures 32.7 mmHg.  Pulmonic Valve: The pulmonic valve was not assessed.  Aorta: The aortic root is normal in size and structure.  Venous: The inferior vena cava is normal in size with less than 50% respiratory variability, suggesting right atrial pressure of 8 mmHg.  IAS/Shunts: No atrial level shunt detected by color flow Doppler.   LEFT VENTRICLE PLAX 2D LVIDd:         5.30 cm LVIDs:         4.80 cm LV PW:         0.90 cm LV  IVS:        0.90 cm LVOT diam:     2.10 cm LVOT Area:     3.46 cm  LV Volumes (MOD) LV vol d, MOD A2C: 162.0 ml LV vol d, MOD A4C: 160.0 ml LV vol s, MOD A2C: 132.0 ml LV vol s, MOD A4C: 124.0 ml LV SV MOD A2C:     30.0 ml LV SV MOD A4C:     160.0 ml LV SV MOD BP:      34.3 ml  IVC IVC diam: 1.80 cm  LEFT ATRIUM         Index LA diam:    4.10 cm 2.53 cm/m AORTIC VALVE AV Vmax:      286.00 cm/s AV Vmean:     206.000 cm/s AV VTI:       0.549 m AV Peak Grad: 32.7 mmHg AV Mean Grad: 19.0 mmHg  AORTA Ao Root diam: 3.50 cm  MITRAL VALVE MV Area (PHT): 6.12 cm    SHUNTS MV Decel Time: 124 msec    Systemic Diam: 2.10 cm MV E velocity: 92.00 cm/s MV A velocity: 70.40 cm/s MV E/A ratio:  1.31  Dalton McleanMD Electronically signed by Ezra Kanner Signature Date/Time: 03/31/2024/9:04:33 PM    Final   TEE  ECHO INTRAOPERATIVE TEE 08/01/2021  Narrative *INTRAOPERATIVE TRANSESOPHAGEAL REPORT *    Patient Name:   DYSEN EDMONDSON Date of Exam: 08/01/2021 Medical Rec #:  [id number]      Height:       64.0 in Accession #:    [id number]     Weight:       155.9 lb Date of Birth:  February 22, 1934     BSA:          1.76 m Patient Age:    87 years       BP:           130/89 mmHg Patient Gender: M              HR:           85 bpm. Exam Location:  Inpatient  Transesophogeal exam was perform intraoperatively during surgical procedure. Patient was closely monitored under general anesthesia during the entirety of examination.  Indications:     coronary artery bypass surgery Performing Phys: [id number] DORISE MARLA FELLERS Diagnosing Phys: Allena Seip MD  POST-OP IMPRESSIONS _ Left Ventricle: Post Bypass: The patient came off bypass with moderate right ventricle systolic dysfunction with dilated right ventricle with inotrophe support. The TEE that was placed after induction without difficulty was removed at the end of the case uneventfully. The patient was later taken to the SICU in  stable condition. _ Right Ventricle: moderately reduced  function. Moderate right ventricular systolic dysfunction that improved with inotropes.  PRE-OP FINDINGS Left Ventricle: The left ventricle has normal systolic function, with an ejection fraction of 60-65%. The cavity size was left ventricular size was not assessed.   Mitral Valve: The mitral valve is normal in structure.  Tricuspid Valve: The tricuspid valve was normal in structure.  Aortic Valve: The aortic valve is tricuspid Aortic valve regurgitation is mild by color flow Doppler. There is moderate calcification present.  +--------------+--------++ LEFT VENTRICLE         +--------------+--------++ PLAX 2D                +--------------+--------++ LVOT diam:    2.00 cm  +--------------+--------++ LVOT Area:    3.14 cm +--------------+--------++                        +--------------+--------++  +-------------+------------++ AORTIC VALVE              +-------------+------------++ AV Vmax:     203.00 cm/s  +-------------+------------++ AV Vmean:    133.000 cm/s +-------------+------------++ AV VTI:      0.381 m      +-------------+------------++ AV Peak Grad:16.5 mmHg    +-------------+------------++ AV Mean Grad:8.0 mmHg     +-------------+------------++ AR PHT:      293 msec     +-------------+------------++   +--------------+-------+ SHUNTS                +--------------+-------+ Systemic Diam:2.00 cm +--------------+-------+   Allena Seip MD Electronically signed by Allena Seip MD Signature Date/Time: 08/04/2021/8:34:08 PM    Final  MONITORS  LONG TERM MONITOR (3-14 DAYS) 12/07/2023  Narrative   Predominant rhythm is sinus rhythm   40 episodes of supraventricular tachycardia with the fastest interval lasting 5 beats with a max HR of 176 and the longest episode lasted 12 beats with an avg. HR of 113.   1 pause lasting 3 seconds    Frequent PACs ( 7.1% PAC burden)   Occasional PVCs ( 2.1% PVC burden)   Patch Wear Time:  13 days and 23 hours (2025-04-29T15:58:01-399 to 2025-05-13T15:57:59-0400)  Patient had a min HR of 42 bpm, max HR of 176 bpm, and avg HR of 72 bpm. Predominant underlying rhythm was Sinus Rhythm. Slight P wave morphology changes were noted. Intermittent Bundle Branch Block was present. 40 Supraventricular Tachycardia runs occurred, the run with the fastest interval lasting 5 beats with a max rate of 176 bpm, the longest lasting 12 beats with an avg rate of 113 bpm. 1 Pause occurred lasting 3 secs (20 bpm). Some Pauses occurred due to Second Degree AV Block-Mobitz I (Wenckebach). Isolated SVEs were frequent (7.1%, 102580), SVE Couplets were rare (<1.0%, 7156), and SVE Triplets were rare (<1.0%, 1080). Isolated VEs were occasional (2.1%, [id number]), VE Couplets were rare (<1.0%, 1966), and no VE Triplets were present.   CT SCANS  CT CORONARY MORPH W/CTA COR W/SCORE 04/01/2024  Addendum 04/01/2024  2:28 PM ADDENDUM REPORT: 04/01/2024 14:26  EXAM: OVER-READ INTERPRETATION  CT CHEST  The following report is an over-read performed by radiologist Dr. Marcey Diones [facility] on 04/01/2024. This over-read does not include interpretation of cardiac or coronary anatomy or pathology. The coronary CTA interpretation by the cardiologist is attached.  COMPARISON:  None.  FINDINGS: The heart size is within normal limits. No pericardial fluid is identified. Visualized segments of the thoracic aorta and central pulmonary arteries are normal in caliber. Visualized mediastinum and hilar  regions demonstrate no lymphadenopathy or masses. Pulmonary edema with moderate bilateral pleural effusions. Associated compressive atelectasis of both lower lobes. Visualized no pulmonary consolidation, pneumothorax or focal nodule. Visualized upper abdomen and bony structures are  unremarkable.  IMPRESSION: Pulmonary edema with moderate bilateral pleural effusions.   Electronically Signed By: Marcey Moan M.D. On: 04/01/2024 14:26  Narrative CLINICAL DATA:  Aortic valve replacement (TAVR), pre-op eval 712435 Severe aortic stenosis [id number]  EXAM: Cardiac TAVR CT  TECHNIQUE: The patient was scanned on a Siemens Force 192 slice scanner. A 120 kV retrospective scan was triggered in the descending thoracic aorta at 111 HU's. Gantry rotation speed was 270 msecs and collimation was .9 mm. The 3D data set was reconstructed in 5% intervals of the R-R cycle. Systolic and diastolic phases were analyzed on a dedicated work station using MPR, MIP and VRT modes. The patient received 100mL OMNIPAQUE  IOHEXOL  350 MG/ML SOLN of contrast.  FINDINGS: Aortic Valve:  Tricuspid aortic valve with severely reduced cusp excursion. Severely thickened and severely calcified aortic valve cusps.  AV calcium  score: 1859  Virtual Basal Annulus Measurements:  Maximum/Minimum Diameter: 27.5 x 20.2 mm  Perimeter: 74.8 mm  Area:  423 mm2  No significant LVOT calcifications.  Membranous septal length: 8.4 mm  Based on these measurements, the annulus would be suitable for a 23 mm Sapien 3 valve. Alternatively, Heart Team can consider 29 mm Evolut valve. Recommend Heart Team discussion for valve selection.  Sinus of Valsalva Measurements:  Non-coronary:  35 mm  Right - coronary:  34 mm  Left - coronary:  34 mm  Coronary height and sinus of Valsalva Height:  Left main: 22 mm, Left sinus: 26 mm  Right coronary: 21 mm, Right sinus: 26 mm  Aorta: Common origin of the brachiocephalic and left common carotid artery. Severe aortic atherosclerosis with very severe atheromatous plaque in the infrarenal abdominal aorta.  Sinotubular Junction:  31 mm  Ascending Thoracic Aorta:  37 mm  Aortic Arch:  30 mm  Descending Thoracic Aorta:  27 mm  Coronary Arteries: Normal  coronary origin. Right dominance. The study was performed without use of NTG and insufficient for plaque evaluation. Coronary artery calcium  score not performed due to prior CABG.  RIMA-LAD patent.  LIMA-OM1 patent.  SVG to OM2 appears occluded at the aortic anastomosis.  Optimum Fluoroscopic Angle for Delivery: LAO 5 CAU 3  OTHER:  Large bilateral pleural effusions.  Atria: Biatrial dilation.  Left atrial appendage: No thrombus.  Mitral valve: Grossly normal, mild mitral annular calcifications.  Pulmonary artery: Normal caliber.  Pulmonary veins: Normal anatomy.  IMPRESSION: 1. 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 Severely thickened and severely calcified aortic valve cusps. 2. Aortic valve calcium  score: 1859 3. Annulus area: 423 mm2, suitable for 23 mm Sapien 3 valve. No LVOT calcifications. Membranous septal length 8.4 mm. 4. Sufficient coronary artery heights from annulus. 5. Optimum fluoroscopic angle for delivery: LAO 5 CAU 3 6. Patent RIMA-LAD, LIMA-OM1.  Occluded SVG to OM2. 7. Severe aortic atherosclerosis with very severe atheromatous plaque in the infrarenal abdominal aorta. 8. Large bilateral pleural effusions.  Electronically Signed: By: Soyla Merck M.D. On: 04/01/2024 14:07     ______________________________________________________________________________________________      Laboratory Data:  High Sensitivity Troponin:   Recent Labs  Lab 03/26/24 0404  TROPONINIHS 484*     Chemistry Recent Labs  Lab 04/01/24 0650 04/02/24 0500 04/03/24 0434  NA 130* 131* 130*  K 5.0 4.2 4.1  CL  98 99 97*  CO2 22 24 26   GLUCOSE 102* 125* 90  BUN 58* 60* 60*  CREATININE 1.68* 1.92* 1.87*  CALCIUM  8.4* 8.2* 7.9*  GFRNONAA 39* 33* 34*  ANIONGAP 10 8 7     Recent Labs  Lab 03/29/24 0600 04/01/24 0650 04/03/24 0434  PROT 6.0* 5.9* 5.2*  ALBUMIN  2.8* 2.6* 2.3*  AST 76* 38 26  ALT 158* 86* 50*  ALKPHOS 115 83 71   BILITOT 1.0 1.1 1.1   Hematology Recent Labs  Lab 03/29/24 0600 03/31/24 0602 04/03/24 0434  WBC 4.8 6.6 5.7  RBC 2.19* 2.30* 2.04*  HGB 7.7* 8.1* 7.2*  HCT 22.8* 24.3* 21.1*  MCV 104.1* 105.7* 103.4*  MCH 35.2* 35.2* 35.3*  MCHC 33.8 33.3 34.1  RDW 19.8* 20.2* 19.9*  PLT 71* 80* 74*   BNPNo results for input(s): BNP, PROBNP in the last 168 hours.  DDimer No results for input(s): DDIMER in the last 168 hours.   Radiology/Studies:  DG CHEST PORT 1 VIEW Result Date: 04/02/2024 CLINICAL DATA:  Bilateral pleural effusions. EXAM: PORTABLE CHEST 1 VIEW COMPARISON:  Radiograph and CT yesterday FINDINGS: Stable right upper extremity PICC tip in the SVC. Unchanged cardiomegaly post median sternotomy and CABG. Trace bilateral pleural effusions. Scattered atelectasis. Improving ground-glass opacities in the upper lung zones. No pneumothorax. IMPRESSION: 1. Trace bilateral pleural effusions. 2. Improving ground-glass opacities in the upper lung zones. Electronically Signed   By: Andrea Gasman M.D.   On: 04/02/2024 12:50   CT ANGIO CHEST AORTA W/CM & OR WO/CM Result Date: 04/01/2024 CLINICAL DATA:  Preoperative evaluation for aortic valve replacement. History of bladder cancer. * Tracking Code: BO * EXAM: CT ANGIOGRAPHY CHEST, ABDOMEN AND PELVIS TECHNIQUE: Multidetector CT imaging through the chest, abdomen and pelvis was performed using the standard protocol during bolus administration of intravenous contrast. Multiplanar reconstructed images and MIPs were obtained and reviewed to evaluate the vascular anatomy. RADIATION DOSE REDUCTION: This exam was performed according to the departmental dose-optimization program which includes automated exposure control, adjustment of the mA and/or kV according to patient size and/or use of iterative reconstruction technique. CONTRAST:  OMNIPAQUE  IOHEXOL  350 MG/ML SOLN COMPARISON:  CT abdomen and pelvis dated 10/19/2022 FINDINGS: CTA CHEST FINDINGS  Cardiovascular: Right upper extremity PICC tip terminates in the lower SVC. Preferential opacification of the thoracic aorta. No evidence of thoracic aortic aneurysm or dissection. Irregular noncalcified atherosclerotic plaque without substantial luminal narrowing along the descending thoracic aorta. No pericardial effusion. Coronary artery calcifications. Reflux of contrast material into the hepatic veins, suggesting a degree of right heart dysfunction. Please see separately dictated cardiac CT report for detailed findings. Mediastinum/Nodes: Imaged thyroid  gland without nodules meeting criteria for imaging follow-up by size. Normal esophagus. No pathologically enlarged axillary, supraclavicular, mediastinal, or hilar lymph nodes. Lungs/Pleura: The central airways are patent. Mild interlobular septal thickening with interspersed areas of ground-glass opacities in the bilateral upper lobes. Near-complete relaxation atelectasis of the bilateral lower lobes. No pneumothorax. Pleural effusions. Musculoskeletal: Median sternotomy wires are nondisplaced. No acute or abnormal lytic or blastic osseous lesions. Review of the MIP images confirms the above findings. CTA ABDOMEN AND PELVIS FINDINGS VASCULAR Aorta: Aortic atherosclerosis. Irregular noncalcified atherosclerotic plaque results in moderate luminal narrowing of the juxtarenal and infrarenal abdominal aorta. No aneurysm or dissection. Celiac: Patent without evidence of aneurysm, dissection, vasculitis or significant stenosis. Segmental mild to moderate luminal narrowing of the splenic artery due to calcified plaque. SMA: Segmental mild-to-moderate luminal narrowing of the SMA due to  atherosclerotic plaque. Replaced hepatic artery arises from the SMA. Renals: Single bilateral renal arteries. Mild luminal narrowing of the proximal arteries due to atherosclerotic plaque. IMA: Severe luminal narrowing of the IMA origin. Inflow: Patent without evidence of aneurysm,  dissection, vasculitis or significant stenosis. Proximal Outflow: Bilateral common femoral and visualized portions of the superficial and profunda femoral arteries are patent without evidence of aneurysm, dissection, vasculitis or significant stenosis. Veins: No obvious venous abnormality within the limitations of this arterial phase study. Review of the MIP images confirms the above findings. NON-VASCULAR Hepatobiliary: No focal hepatic lesions. No intra or extrahepatic biliary ductal dilation. Normal gallbladder. Pancreas: No focal lesions or main ductal dilation. Spleen: Normal in size without focal abnormality. Adrenals/Urinary Tract: No adrenal nodules. Again seen are multiple bilateral renal cysts, including peripherally calcified irregular structure arising exophytically from the lower pole left kidney with an adjacent intermediate attenuation cyst (6:132). Some of the cysts are simple while others are hemorrhagic/proteinaceous. No hydronephrosis or calculi. Underdistended urinary bladder contains anti dependent intraluminal gas as well as multiple scattered punctate foci of gas along the posterior urinary bladder. There is irregular mural thickening along the right bladder wall with a small nodular focus protruding into the bladder lumen measuring 7 mm (6:177). A mass in this area previously measured 2.1 cm. Stomach/Bowel: Normal appearance of the stomach. No evidence of bowel wall thickening, distention, or inflammatory changes. Appendix is not discretely seen. Lymphatic: No enlarged abdominal or pelvic lymph nodes. Reproductive: Prostate is unremarkable. Other: Small volume presacral free fluid. No free air or fluid collection. Musculoskeletal: No acute or abnormal lytic or blastic osseous lesions. Diffuse body wall edema. Review of the MIP images confirms the above findings. IMPRESSION: 1. Irregular noncalcified atherosclerotic plaque results in moderate luminal narrowing of the juxtarenal and infrarenal  abdominal aorta. No aneurysm or dissection. 2. Mild interlobular septal thickening with interspersed areas of ground-glass opacities in the bilateral upper lobes, likely pulmonary edema. 3. Bilateral large pleural effusions with near-complete relaxation atelectasis of the bilateral lower lobes. 4. Irregular mural thickening along the right bladder wall with a small nodular focus protruding into the bladder lumen measuring 7 mm. A mass in this area previously measured 2.1 cm. Findings are consistent with known bladder cancer. 5. Intraluminal gas within the urinary bladder including scattered punctate foci along the posterior bladder wall. 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 6. Multiple bilateral renal cysts, some of which are simple while others are hemorrhagic/proteinaceous. These are not substantially changed in size compared to 10/19/2022 and better evaluated on prior CT dated 11/29/2022. 7. Reflux of contrast material into the hepatic veins, suggesting a degree of right heart dysfunction. 8.  Aortic Atherosclerosis (ICD10-I70.0). Electronically Signed   By: Limin  Xu M.D.   On: 04/01/2024 18:28   CT Angio Abd/Pel w/ and/or w/o Result Date: 04/01/2024 CLINICAL DATA:  Preoperative evaluation for aortic valve replacement. History of bladder cancer. * Tracking Code: BO * EXAM: CT ANGIOGRAPHY CHEST, ABDOMEN AND PELVIS TECHNIQUE: Multidetector CT imaging through the chest, abdomen and pelvis was performed using the standard protocol during bolus administration of intravenous contrast. Multiplanar reconstructed images and MIPs were obtained and reviewed to evaluate the vascular anatomy. RADIATION DOSE REDUCTION: This exam was performed according to the departmental dose-optimization program which includes automated exposure control, adjustment of the mA and/or kV according to patient size and/or use of iterative  reconstruction technique. CONTRAST:  OMNIPAQUE  IOHEXOL  350  MG/ML SOLN COMPARISON:  CT abdomen and pelvis dated 10/19/2022 FINDINGS: CTA CHEST FINDINGS Cardiovascular: Right upper extremity PICC tip terminates in the lower SVC. Preferential opacification of the thoracic aorta. No evidence of thoracic aortic aneurysm or dissection. Irregular noncalcified atherosclerotic plaque without substantial luminal narrowing along the descending thoracic aorta. No pericardial effusion. Coronary artery calcifications. Reflux of contrast material into the hepatic veins, suggesting a degree of right heart dysfunction. Please see separately dictated cardiac CT report for detailed findings. Mediastinum/Nodes: Imaged thyroid  gland without nodules meeting criteria for imaging follow-up by size. Normal esophagus. No pathologically enlarged axillary, supraclavicular, mediastinal, or hilar lymph nodes. Lungs/Pleura: The central airways are patent. Mild interlobular septal thickening with interspersed areas of ground-glass opacities in the bilateral upper lobes. Near-complete relaxation atelectasis of the bilateral lower lobes. No pneumothorax. Pleural effusions. Musculoskeletal: Median sternotomy wires are nondisplaced. No acute or abnormal lytic or blastic osseous lesions. Review of the MIP images confirms the above findings. CTA ABDOMEN AND PELVIS FINDINGS VASCULAR Aorta: Aortic atherosclerosis. Irregular noncalcified atherosclerotic plaque results in moderate luminal narrowing of the juxtarenal and infrarenal abdominal aorta. No aneurysm or dissection. Celiac: Patent without evidence of aneurysm, dissection, vasculitis or significant stenosis. Segmental mild to moderate luminal narrowing of the splenic artery due to calcified plaque. SMA: Segmental mild-to-moderate luminal narrowing of the SMA due to atherosclerotic plaque. Replaced hepatic artery arises from the SMA. Renals: Single bilateral renal arteries. Mild luminal  narrowing of the proximal arteries due to atherosclerotic plaque. IMA: Severe luminal narrowing of the IMA origin. Inflow: Patent without evidence of aneurysm, dissection, vasculitis or significant stenosis. Proximal Outflow: Bilateral common femoral and visualized portions of the superficial and profunda femoral arteries are patent without evidence of aneurysm, dissection, vasculitis or significant stenosis. Veins: No obvious venous abnormality within the limitations of this arterial phase study. Review of the MIP images confirms the above findings. NON-VASCULAR Hepatobiliary: No focal hepatic lesions. No intra or extrahepatic biliary ductal dilation. Normal gallbladder. Pancreas: No focal lesions or main ductal dilation. Spleen: Normal in size without focal abnormality. Adrenals/Urinary Tract: No adrenal nodules. Again seen are multiple bilateral renal cysts, including peripherally calcified irregular structure arising exophytically from the lower pole left kidney with an adjacent intermediate attenuation cyst (6:132). Some of the cysts are simple while others are hemorrhagic/proteinaceous. No hydronephrosis or calculi. Underdistended urinary bladder contains anti dependent intraluminal gas as well as multiple scattered punctate foci of gas along the posterior urinary bladder. There is irregular mural thickening along the right bladder wall with a small nodular focus protruding into the bladder lumen measuring 7 mm (6:177). A mass in this area previously measured 2.1 cm. Stomach/Bowel: Normal appearance of the stomach. No evidence of bowel wall thickening, distention, or inflammatory changes. Appendix is not discretely seen. Lymphatic: No enlarged abdominal or pelvic lymph nodes. Reproductive: Prostate is unremarkable. Other: Small volume presacral free fluid. No free air or fluid collection. Musculoskeletal: No acute or abnormal lytic or blastic osseous lesions. Diffuse body wall edema. Review of the MIP images  confirms the above findings. IMPRESSION: 1. Irregular noncalcified atherosclerotic plaque results in moderate luminal narrowing of the juxtarenal and infrarenal abdominal aorta. No aneurysm or dissection. 2. Mild interlobular septal thickening with interspersed areas of ground-glass opacities in the bilateral upper lobes, likely pulmonary edema. 3. Bilateral large pleural effusions with near-complete relaxation atelectasis of the bilateral lower lobes. 4. Irregular mural thickening along the right bladder wall with a small nodular focus protruding into the bladder lumen measuring 7 mm. A  mass in this area previously measured 2.1 cm. Findings are consistent with known bladder cancer. 5. Intraluminal gas within the urinary bladder including scattered punctate foci along the posterior bladder wall. 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 6. Multiple bilateral renal cysts, some of which are simple while others are hemorrhagic/proteinaceous. These are not substantially changed in size compared to 10/19/2022 and better evaluated on prior CT dated 11/29/2022. 7. Reflux of contrast material into the hepatic veins, suggesting a degree of right heart dysfunction. 8.  Aortic Atherosclerosis (ICD10-I70.0). Electronically Signed   By: Limin  Xu M.D.   On: 04/01/2024 18:28   DG CHEST PORT 1 VIEW Result Date: 04/01/2024 CLINICAL DATA:  Pleural effusion.  Post bilateral thoracentesis. EXAM: PORTABLE CHEST 1 VIEW COMPARISON:  Chest radiograph 03/25/2024.  CT chest 04/01/2024 FINDINGS: Postoperative changes in the mediastinum. Mild cardiac enlargement. No vascular congestion or edema. Minimal bilateral or pleural effusions representing interval decreased since prior CT post thoracentesis. No pneumothorax. Mediastinal contours appear intact. A right PICC line is in place with tip projecting over the low SVC region. Calcification in the  aorta. Probable emphysematous changes in the lungs. Peribronchial thickening with perihilar infiltrates likely representing chronic bronchitis. IMPRESSION: 1. Minimal if any residual pleural effusions post thoracentesis. No pneumothorax. 2. Mild cardiac enlargement. 3. 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 Electronically Signed   By: Elsie Gravely M.D.   On: 04/01/2024 18:17   CT CORONARY MORPH W/CTA COR W/SCORE W/CA W/CM &/OR WO/CM Addendum Date: 04/01/2024 ADDENDUM REPORT: 04/01/2024 14:26 EXAM: OVER-READ INTERPRETATION  CT CHEST The following report is an over-read performed by radiologist Dr. Marcey Diones [facility] on 04/01/2024. This over-read does not include interpretation of cardiac or coronary anatomy or pathology. The coronary CTA interpretation by the cardiologist is attached. COMPARISON:  None. FINDINGS: The heart size is within normal limits. No pericardial fluid is identified. Visualized segments of the thoracic aorta and central pulmonary arteries are normal in caliber. Visualized mediastinum and hilar regions demonstrate no lymphadenopathy or masses. Pulmonary edema with moderate bilateral pleural effusions. Associated compressive atelectasis of both lower lobes. Visualized no pulmonary consolidation, pneumothorax or focal nodule. Visualized upper abdomen and bony structures are unremarkable. IMPRESSION: Pulmonary edema with moderate bilateral pleural effusions. Electronically Signed   By: Marcey Moan M.D.   On: 04/01/2024 14:26   Result Date: 04/01/2024 CLINICAL DATA:  Aortic valve replacement (TAVR), pre-op eval [id number] Severe aortic stenosis [id number] EXAM: Cardiac TAVR CT TECHNIQUE: The patient was scanned on a Siemens Force 192 slice scanner. A 120 kV retrospective scan was triggered in the descending thoracic aorta at 111 HU's. Gantry rotation speed was 270 msecs and collimation was .9 mm. The 3D data set was reconstructed in 5% intervals of the R-R cycle.  Systolic and diastolic phases were analyzed on a dedicated work station using MPR, MIP and VRT modes. The patient received 100mL OMNIPAQUE  IOHEXOL  350 MG/ML SOLN of contrast. 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 Severely thickened and severely calcified aortic valve cusps. AV calcium  score: 1859 Virtual Basal Annulus Measurements: Maximum/Minimum Diameter: 27.5 x 20.2 mm Perimeter: 74.8 mm Area:  423 mm2 No significant LVOT calcifications. Membranous septal length: 8.4 mm Based on these measurements, the annulus would be suitable for a 23 mm Sapien 3 valve. Alternatively, Heart Team can consider 29 mm Evolut valve. Recommend Heart Team discussion for valve selection. Sinus of Valsalva Measurements: Non-coronary:  35 mm Right - coronary:  34 mm Left - coronary:  34 mm Coronary height and sinus of Valsalva Height: Left main: 22 mm, Left sinus: 26 mm Right coronary: 21 mm, Right sinus: 26 mm Aorta: Common origin of the brachiocephalic and left common carotid artery. Severe aortic atherosclerosis with very severe atheromatous plaque in the infrarenal abdominal aorta. Sinotubular Junction:  31 mm Ascending Thoracic Aorta:  37 mm Aortic Arch:  30 mm Descending Thoracic Aorta:  27 mm Coronary Arteries: Normal coronary origin. Right dominance. The study was performed without use of NTG and insufficient for plaque evaluation. Coronary artery calcium  score not performed due to prior CABG. RIMA-LAD patent. LIMA-OM1 patent. SVG to OM2 appears occluded at the aortic anastomosis. Optimum Fluoroscopic Angle for Delivery: LAO 5 CAU 3 OTHER: Large bilateral pleural effusions. Atria: Biatrial dilation. Left atrial appendage: No thrombus. Mitral valve: Grossly normal, mild mitral annular calcifications. Pulmonary artery: Normal caliber. Pulmonary veins: Normal anatomy. IMPRESSION: 1. 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 Severely thickened and severely calcified  aortic valve cusps. 2. Aortic valve calcium  score: 1859 3. Annulus area: 423 mm2, suitable for 23 mm Sapien 3 valve. No LVOT calcifications. Membranous septal length 8.4 mm. 4. Sufficient coronary artery heights from annulus. 5. Optimum fluoroscopic angle for delivery: LAO 5 CAU 3 6. Patent RIMA-LAD, LIMA-OM1.  Occluded SVG to OM2. 7. Severe aortic atherosclerosis with very severe atheromatous plaque in the infrarenal abdominal aorta. 8. Large bilateral pleural effusions. Electronically Signed: By: Soyla Merck M.D. On: 04/01/2024 14:07   ECHOCARDIOGRAM LIMITED Result Date: 03/31/2024    TRANSESOPHOGEAL ECHO REPORT   Patient Name:   ABDULLAH RIZZI Date of Exam: 03/31/2024 Medical Rec #:  [id number]      Height:       64.0 in Accession #:    [id number]     Weight:       128.5 lb Date of Birth:  09-21-1933     BSA:          1.621 m Patient Age:    89 years       BP:           123/66 mmHg Patient Gender: M              HR:           79 bpm. Exam Location:  Inpatient Procedure: Limited Echo, Color Doppler and Cardiac Doppler (Both Spectral and            Color Flow Doppler were utilized during procedure). Indications:    Aortic Stenosis  History:        Patient has prior history of Echocardiogram examinations, most                 recent 03/26/2024.  Sonographer:    Tinnie Gosling RDCS Referring Phys: [id number] GLENDIA DASEN WEAVER IMPRESSIONS  1. Left ventricular ejection fraction, by estimation, is 20%. The left ventricle has severely decreased function. The left ventricle demonstrates global hypokinesis. The left ventricular internal cavity size was mildly dilated. No LV thrombus noted.  2. Right ventricular systolic function is mildly reduced. The right ventricular size is normal. Tricuspid regurgitation signal is inadequate for assessing PA pressure.  3. Left atrial size was mildly dilated.  4. The mitral valve is degenerative. Moderate mitral valve regurgitation. No evidence of mitral stenosis. Moderate mitral annular  calcification.  5. The aortic valve is tricuspid. There is severe calcifcation of the aortic valve. Aortic valve regurgitation  is mild. Aortic valve mean gradient measures 19.0 mmHg. Visually, I suspect low flow/low gradient severe aortic stenosis. However, no LVOT VTI  measurement was made, unable to calculate aortic valve area. Would suggest repeat study with fully interrogation of the aortic valve.  6. The inferior vena cava is normal in size with <50% respiratory variability, suggesting right atrial pressure of 8 mmHg.  7. Left pleural effusion present. FINDINGS  Left Ventricle: Left ventricular ejection fraction, by estimation, is 20%. The left ventricle has severely decreased function. The left ventricle demonstrates global hypokinesis. The left ventricular internal cavity size was mildly dilated. Right Ventricle: The right ventricular size is normal. No increase in right ventricular wall thickness. Right ventricular systolic function is mildly reduced. Tricuspid regurgitation signal is inadequate for assessing PA pressure. Left Atrium: Left atrial size was mildly dilated. Right Atrium: Right atrial size was normal in size. Pericardium: Left pleural effusion present. There is no evidence of pericardial effusion. Mitral Valve: The mitral valve is degenerative in appearance. There is mild calcification of the mitral valve leaflet(s). Moderate mitral annular calcification. Moderate mitral valve regurgitation. No evidence of mitral valve stenosis. Tricuspid Valve: The tricuspid valve is not assessed. Aortic Valve: The aortic valve is tricuspid. There is severe calcifcation of the aortic valve. Aortic valve regurgitation is mild. Aortic valve mean gradient measures 19.0 mmHg. Aortic valve peak gradient measures 32.7 mmHg. Pulmonic Valve: The pulmonic valve was not assessed. Aorta: The aortic root is normal in size and structure. Venous: The inferior vena cava is normal in size with less than 50% respiratory  variability, suggesting right atrial pressure of 8 mmHg. IAS/Shunts: No atrial level shunt detected by color flow Doppler.  LEFT VENTRICLE PLAX 2D LVIDd:         5.30 cm LVIDs:         4.80 cm LV PW:         0.90 cm LV IVS:        0.90 cm LVOT diam:     2.10 cm LVOT Area:     3.46 cm  LV Volumes (MOD) LV vol d, MOD A2C: 162.0 ml LV vol d, MOD A4C: 160.0 ml LV vol s, MOD A2C: 132.0 ml LV vol s, MOD A4C: 124.0 ml LV SV MOD A2C:     30.0 ml LV SV MOD A4C:     160.0 ml LV SV MOD BP:      34.3 ml IVC IVC diam: 1.80 cm LEFT ATRIUM         Index LA diam:    4.10 cm 2.53 cm/m  AORTIC VALVE AV Vmax:      286.00 cm/s AV Vmean:     206.000 cm/s AV VTI:       0.549 m AV Peak Grad: 32.7 mmHg AV Mean Grad: 19.0 mmHg  AORTA Ao Root diam: 3.50 cm MITRAL VALVE MV Area (PHT): 6.12 cm    SHUNTS MV Decel Time: 124 msec    Systemic Diam: 2.10 cm MV E velocity: 92.00 cm/s MV A velocity: 70.40 cm/s MV E/A ratio:  1.31 Dalton McleanMD Electronically signed by Ezra Kanner Signature Date/Time: 03/31/2024/9:04:33 PM    Final      Pre Surgical Assessment: 5 M Walk Test  62M=16.15ft  5 Meter Walk Test- trial 1: 7 seconds 5 Meter Walk Test- trial 2: 7 seconds 5 Meter Walk Test- trial 3: 9 seconds 5 Meter Walk Test Average: 7.66 seconds    ________________________________       Procedure  Type: Isolated AVR Perioperative OutcomeEstimate % Operative Mortality48.6% Morbidity & Mortality74% Stroke12.1% Renal Failure37.4% Reoperation17% Prolonged Ventilation69.5% Deep Sternal Wound Infection0.168% [facility] Stay (>14 days)54.9% [facility] Stay (<6 days)*2.31%     ______________________________     [place] Cardiomyopathy Questionnaire      03/28/2024   11:34 AM  KCCQ-12  1 a. Ability to shower/bathe Extremely limited  1 b. Ability to walk 1 block Extremely limited  1 c. Ability to hurry/jog Other, Did not do  2. Edema feet/ankles/legs Every morning  3. Limited by fatigue All of the time  4.  Limited by dyspnea All of the time  5. Sitting up / on 3+ pillows Every night  6. Limited enjoyment of life Extremely limited  7. Rest of life w/ symptoms Not at all satisfied  8 a. Participation in hobbies Severely limited  8 b. Participation in chores Severely limited  8 c. Visiting family/friends Severely limited        Assessment and Plan:   Indalecio Malmstrom Doescher is a 89 y.o. male with symptoms of severe, stage D2 LFLG aortic stenosis with NYHA Class IV symptoms who was admitted for biventricular cardiogenic shock requiring milrinone  support with AKI and shock liver as well as anemia. He diuresed well on IV Lasix . Co-ox now stable and off milrinone . Repeat echo showed improvement in RV function. LFTs normalizing. Creat stable. 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  He has been seen by palliative care and remains full code and strongly desires anything that will improve his QOL and get him home to his wife and daughter. He is now a DNR.    TTE 03/26/24 w/ LVEF <20%, G2DD, moderate RV dysfunction, severe MR, mod-severe TR, moderate PR, moderate pleural effusion in lateral region, and severe LFLG aortic stenosis with mean gradient 17 mm hg, Vmax 2.85 m/s, AVA 0.77cm2, and moderate AI.   Repeat limited echo 03/31/24 showed EF 20%, LV with GHK, RV mildly reduced (improved), LA mildly dilated, mod MR, AV mean gradient 19 mmHg, severe aortic stenosis.    04/01/24: Cardiac gated CTA of the heart revealed anatomical characteristics consistent with aortic stenosis suitable for treatment by transcatheter aortic valve replacement without any significant complicating features and CTA of the aorta and iliac vessels demonstrate what appear to be adequate pelvic vascular access to facilitate a transfemoral approach.    Cardiac CT demonstrated patent RIMA-LAD and LIMA-OM1 as well as an occluded SVG-->OM which was not felt to explain his cardiomyopathy. Dr. Wendel did not think  that coronary angiography was necessay as it would likely not alter management. Large bilateral pleural effusions were noted on CTs and he is now s/p -1.3L on the L and -1.4L on the R thoracentesis 04/01/24 with marked symptomatic improvement. LFTs have improved and creat stable around 1.87. Hg 7.2 today and being transfused.    I have reviewed the natural history of aortic stenosis with the patient. We have discussed the limitations of medical therapy and the poor prognosis associated with symptomatic aortic stenosis. We have reviewed potential treatment options, including palliative medical therapy, conventional surgical aortic valve replacement, and transcatheter aortic valve replacement. We discussed treatment options in the context of this patient's specific comorbid medical conditions.    The patient's predicted risk of mortality with conventional aortic valve replacement is 48.6% primarily based on age, low BMI, previous sternotomy, with CABG, cardiogenic shock, acute on chronic kidney disease, anemia. Other significant comorbid conditions include RV dysfunction, which has now improved  somewhat. He is not a surgical candidate under any circumstances. Of note, he is DNR.  The pt is posted for TAVR-TF tomorrow @ 7:30am with Dr. Wendel, Dr. Lucas and Dr. Daniel. He is not a bailout candidate. Inpatient orders placed.      Signed, Lamarr Hummer, PA-C  04/03/2024 1:34 PM   Chart reviewed, patient examined, agree with above.  This 89 year old gentleman is known to me status post CABG x 3 in 2023 with a LIMA to the OM1, RIMA to the LAD, and vein graft to an obtuse marginal branch.  TEE at the time of his surgery showed a moderately calcified trileaflet aortic valve with a mean gradient of 8 mmHg and therefore it was not replaced.  He had a follow-up echocardiogram in June 2025 showing a mean gradient of 23 mmHg with a dimensionless index of 0.35 and a valve area of 1.1 cm by VTI.  He now presents  with NYHA class IV symptoms of fatigue, shortness of breath, orthopnea, and peripheral edema and was admitted with biventricular cardiogenic shock requiring milrinone  support.  He developed acute kidney injury and shock liver as well as anemia.  He improved with diuresis and was weaned off milrinone .  Echocardiogram on 03/26/2024 showed a left ventricular ejection fraction of less than 20% with moderate RV dysfunction.  There was severe mitral regurgitation and severe low-flow/low gradient aortic stenosis with a mean gradient of 17 mmHg, AVA of 0.77 cm, and moderate AI.  A follow-up echocardiogram on 03/31/2024 showed an ejection fraction of 20% with global hypokinesis.  Right ventricular function was improved.  There was severe aortic stenosis with a mean gradient of 19 mmHg and moderate mitral regurgitation.  I agree that aortic valve replacement is the only good option for treating this patient.  He is not a candidate for open double valve replacement.  His gated cardiac CTA shows anatomy suitable for TAVR using a 23 mm SAPIEN 3 valve.  His abdominal and pelvic CTA shows anatomy suitable for transfemoral insertion.  There is severe atheromatous plaque in the infrarenal abdominal aorta which will increase his risk of peripheral embolization. We discussed complications that might develop including but not limited to risks of death, stroke, paravalvular leak, aortic dissection or other major vascular complications, aortic annulus rupture, device embolization, cardiac rupture or perforation, mitral regurgitation, acute myocardial infarction, arrhythmia, heart block or bradycardia requiring permanent pacemaker placement, congestive heart failure, respiratory failure, renal failure, pneumonia, infection, other late complications related to structural valve deterioration or migration, or other complications that might ultimately cause a temporary or permanent loss of functional independence or other long term morbidity.   He is not a candidate for emergent sternotomy to manage any intraoperative complications.  The patient provides full informed consent for the procedure as described and all questions were answered.   Dorise LOIS Lucas, MD

## 2024-04-03 NOTE — Progress Notes (Signed)
 Advanced Heart Failure Rounding Note  Cardiologist: None  Chief Complaint: Heart Failure/AS Subjective:   9/17- Diuresed with IV lasix .  S/p -1.3L on the L and -1.4L on the R Thoracentesis 04/01/24  Have been holding diuretics. CVP <5  Feels fine this morning. Resting in bed eating breakfast. No complaints.   Objective:   Weight Range: 57 kg Body mass index is 21.57 kg/m.   Vital Signs:   Temp:  [97.4 F (36.3 C)-97.9 F (36.6 C)] 97.7 F (36.5 C) (09/25 0747) Pulse Rate:  [64-133] 100 (09/25 0747) Resp:  [11-24] 15 (09/25 0747) BP: (97-110)/(53-69) 110/69 (09/25 0747) SpO2:  [69 %-100 %] 98 % (09/25 0747) Weight:  [57 kg] 57 kg (09/25 0446) Last BM Date : 03/31/24  Weight change: Filed Weights   04/01/24 0329 04/02/24 0331 04/03/24 0446  Weight: 58.3 kg 56.3 kg 57 kg    Intake/Output:   Intake/Output Summary (Last 24 hours) at 04/03/2024 0844 Last data filed at 04/03/2024 0600 Gross per 24 hour  Intake 373 ml  Output 1000 ml  Net -627 ml  CVP <5  Physical Exam  General:  elderly appearing.   Neck: JVD flat.  Cor: regular rate & irregular rhythm. 3/6 RUSB murmur. Lungs: clear, diminished bases Extremities: no edema. PICC RUE Neuro: alert & oriented x 3. Affect pleasant.   Telemetry  A fib  vs NSR 70s, intt PVCs (Personally reviewed)    Labs    CBC Recent Labs    04/03/24 0434  WBC 5.7  HGB 7.2*  HCT 21.1*  MCV 103.4*  PLT 74*   Basic Metabolic Panel Recent Labs    90/75/74 0500 04/03/24 0434  NA 131* 130*  K 4.2 4.1  CL 99 97*  CO2 24 26  GLUCOSE 125* 90  BUN 60* 60*  CREATININE 1.92* 1.87*  CALCIUM  8.2* 7.9*  MG 1.8  --    Liver Function Tests Recent Labs    04/01/24 0650 04/03/24 0434  AST 38 26  ALT 86* 50*  ALKPHOS 83 71  BILITOT 1.1 1.1  PROT 5.9* 5.2*  ALBUMIN  2.6* 2.3*    No results for input(s): LIPASE, AMYLASE in the last 72 hours.  Cardiac Enzymes No results for input(s): CKTOTAL, CKMB,  CKMBINDEX, TROPONINI in the last 72 hours.  BNP: BNP (last 3 results) Recent Labs    03/26/24 0404  BNP >4,500.0*    ProBNP (last 3 results) No results for input(s): PROBNP in the last 8760 hours.   D-Dimer No results for input(s): DDIMER in the last 72 hours. Hemoglobin A1C No results for input(s): HGBA1C in the last 72 hours. Fasting Lipid Panel No results for input(s): CHOL, HDL, LDLCALC, TRIG, CHOLHDL, LDLDIRECT in the last 72 hours. Thyroid  Function Tests No results for input(s): TSH, T4TOTAL, T3FREE, THYROIDAB in the last 72 hours.  Invalid input(s): FREET3  Other results:  Imaging   No results found.   Medications:   Scheduled Medications:  Chlorhexidine  Gluconate Cloth  6 each Topical Daily   Influenza vac split trivalent PF  0.5 mL Intramuscular Tomorrow-1000   rosuvastatin   10 mg Oral Daily   sodium chloride  flush  3 mL Intravenous Q12H    Infusions:    PRN Medications: acetaminophen , guaiFENesin -dextromethorphan , lip balm, mouth rinse, senna, sodium chloride  flush, trimethobenzamide   Patient Profile  89 y.o. male with history of CAD w/ prior NSTEMI in 2023 s/p CABG, AS, ischemic CM with prior recovery in EF, CKD IIIa, hx bradycardia, bladder cancer  s/p TURPT, chronic anemia.   Admitted with acute systolic heart failure/cardiogenic shock in setting of severe AS and acute on chronic anemia.  Assessment/Plan  Acute systolic CHF>cardiogenic shock -EF 50-55% in 06/25 w/ reported moderate AS -Echo this admit: EF 15-20% w/ severe AS on preliminary review - Limited echo 03/31/24 EF 20%, no LV thrombus, LV with GHK, RV mildly reduced, LA mildly dilated, mod MR, AV mean gradient 19 mmHg, severe aortic stenosis -Lactic acid 2.2>2.3>1.9 -Etiology for drop in EF not certain. Could be d/t AS. Also has LBBB w/ wide QRS.  - Stable off Milrinone . CO-OX remains stable.  - GDMT limited with shock/AKI - CVP <5. Hold further diuresis.   - GDMT limited by AKI - S/p -1.3L on the L and -1.4L on the R Thoracentesis 04/01/24   Severe AS - Noted on echo this admit - Structural heart team following - Tentative plan for TAVR next week (04/08/24)   CAD Elevated troponin -NSTEMI 2023 s/p CABG X 3 -HS troponin 484 -Suspect demand ischemia -Statin on hold d/t shock liver. No aspirin  with significant anemia.   Acute on chronic macrocytic anemia -Hbgb previously 9s-10s, 8.3 in April. 7.2 on admit s/p 1 u RBCs -FOBT X 1 negative.  - Hgb 7.2 this morning -? AVMs in setting of severe AS   AKI on CKD IIIa -Baseline Scr 1.2-1.3, peaked 2.7 in setting of shock, 1.7 today.  Elevated LFTs -Suspect shock liver -LFTs trending down  -US  abdomen with biliary sludge and gallbladder wall thickening  A fib? - Check EKG this morning - EKG on admission with NSR - Hgb 7.2 / plt 74 - Not a great candidate for [facility]   GOC: Palliative Care consulted. Now DNR.   Length of Stay: 8  Beckey LITTIE Coe, NP  04/03/2024, 8:44 AM  Advanced Heart Failure Team Pager [phone] (M-F; 7a - 5p)  Please contact [facility] for night-coverage after hours (5p -7a ) and weekends on [email or web address]

## 2024-04-04 ENCOUNTER — Other Ambulatory Visit: Payer: Self-pay

## 2024-04-04 ENCOUNTER — Inpatient Hospital Stay (HOSPITAL_COMMUNITY): Payer: Self-pay | Admitting: Certified Registered Nurse Anesthetist

## 2024-04-04 ENCOUNTER — Inpatient Hospital Stay (HOSPITAL_COMMUNITY)

## 2024-04-04 ENCOUNTER — Encounter (HOSPITAL_COMMUNITY)
Admission: EM | Disposition: A | Discharge status: Home Health | Payer: Self-pay | Source: Home / Self Care | Attending: Internal Medicine

## 2024-04-04 ENCOUNTER — Encounter (HOSPITAL_COMMUNITY): Payer: Self-pay | Admitting: Internal Medicine

## 2024-04-04 DIAGNOSIS — I2510 Atherosclerotic heart disease of native coronary artery without angina pectoris: Secondary | ICD-10-CM

## 2024-04-04 DIAGNOSIS — Z952 Presence of prosthetic heart valve: Secondary | ICD-10-CM

## 2024-04-04 DIAGNOSIS — I350 Nonrheumatic aortic (valve) stenosis: Secondary | ICD-10-CM | POA: Diagnosis not present

## 2024-04-04 DIAGNOSIS — Z515 Encounter for palliative care: Secondary | ICD-10-CM

## 2024-04-04 DIAGNOSIS — Z87891 Personal history of nicotine dependence: Secondary | ICD-10-CM

## 2024-04-04 DIAGNOSIS — I10 Essential (primary) hypertension: Secondary | ICD-10-CM

## 2024-04-04 DIAGNOSIS — Z006 Encounter for examination for normal comparison and control in clinical research program: Secondary | ICD-10-CM

## 2024-04-04 DIAGNOSIS — J90 Pleural effusion, not elsewhere classified: Secondary | ICD-10-CM | POA: Diagnosis not present

## 2024-04-04 DIAGNOSIS — I5023 Acute on chronic systolic (congestive) heart failure: Secondary | ICD-10-CM | POA: Diagnosis not present

## 2024-04-04 HISTORY — PX: INTRAOPERATIVE TRANSTHORACIC ECHOCARDIOGRAM: SHX6523

## 2024-04-04 LAB — URINALYSIS, ROUTINE W REFLEX MICROSCOPIC
Bilirubin Urine: NEGATIVE
Glucose, UA: NEGATIVE mg/dL
Ketones, ur: NEGATIVE mg/dL
Nitrite: NEGATIVE
Protein, ur: 30 mg/dL — AB
Specific Gravity, Urine: 1.017 (ref 1.005–1.030)
WBC, UA: >50 WBC/hpf (ref 0–5)
pH: 5 (ref 5.0–8.0)

## 2024-04-04 LAB — CBC
HCT: 24.7 % — ABNORMAL LOW (ref 39.0–52.0)
Hemoglobin: 8.3 g/dL — ABNORMAL LOW (ref 13.0–17.0)
MCH: 33.9 pg (ref 26.0–34.0)
MCHC: 33.6 g/dL (ref 30.0–36.0)
MCV: 100.8 fL — ABNORMAL HIGH (ref 80.0–100.0)
Platelets: 75 K/uL — ABNORMAL LOW (ref 150–400)
RBC: 2.45 MIL/uL — ABNORMAL LOW (ref 4.22–5.81)
RDW: 20.6 % — ABNORMAL HIGH (ref 11.5–15.5)
WBC: 5.5 K/uL (ref 4.0–10.5)
nRBC: 0.4 % — ABNORMAL HIGH (ref 0.0–0.2)

## 2024-04-04 LAB — BASIC METABOLIC PANEL WITH GFR
Anion gap: 7 (ref 5–15)
BUN: 46 mg/dL — ABNORMAL HIGH (ref 8–23)
CO2: 25 mmol/L (ref 22–32)
Calcium: 8.2 mg/dL — ABNORMAL LOW (ref 8.9–10.3)
Chloride: 99 mmol/L (ref 98–111)
Creatinine, Ser: 1.5 mg/dL — ABNORMAL HIGH (ref 0.61–1.24)
GFR, Estimated: 45 mL/min — ABNORMAL LOW (ref >60)
Glucose, Bld: 100 mg/dL — ABNORMAL HIGH (ref 70–99)
Potassium: 4.4 mmol/L (ref 3.5–5.1)
Sodium: 131 mmol/L — ABNORMAL LOW (ref 135–145)

## 2024-04-04 LAB — COOXEMETRY PANEL
Carboxyhemoglobin: 1.8 % — ABNORMAL HIGH (ref 0.5–1.5)
Methemoglobin: <0.7 % (ref 0.0–1.5)
O2 Saturation: 64.4 %
Total hemoglobin: 8.7 g/dL — ABNORMAL LOW (ref 12.0–16.0)

## 2024-04-04 LAB — HAPTOGLOBIN: Haptoglobin: 104 mg/dL (ref 38–329)

## 2024-04-04 MED ORDER — ACETAMINOPHEN 325 MG PO TABS: 650.0000 mg | ORAL_TABLET | Freq: Four times a day (QID) | ORAL | Status: DC | PRN

## 2024-04-04 MED ORDER — HEPARIN (PORCINE) IN NACL 2000-0.9 UNIT/L-% IV SOLN
INTRAVENOUS | Status: DC | PRN
  Administered 2024-04-04 (×2): 1000 mL

## 2024-04-04 MED ORDER — SODIUM CHLORIDE 0.9 % IV SOLN
1.0000 g | INTRAVENOUS | Status: DC
  Administered 2024-04-04 – 2024-04-07 (×4): 1 g via INTRAVENOUS
  Filled 2024-04-04 (×5): qty 10

## 2024-04-04 MED ORDER — SODIUM CHLORIDE 0.9 % IV SOLN: INTRAVENOUS | Status: AC

## 2024-04-04 MED ORDER — SODIUM CHLORIDE 0.9% FLUSH
3.0000 mL | Freq: Two times a day (BID) | INTRAVENOUS | Status: DC
  Administered 2024-04-04 – 2024-04-10 (×12): 3 mL via INTRAVENOUS

## 2024-04-04 MED ORDER — SODIUM CHLORIDE 0.9% FLUSH: 3.0000 mL | INTRAVENOUS | Status: DC | PRN

## 2024-04-04 MED ORDER — HEPARIN SODIUM (PORCINE) 1000 UNIT/ML IJ SOLN
INTRAMUSCULAR | Status: DC | PRN
  Administered 2024-04-04: 10000 [IU] via INTRAVENOUS

## 2024-04-04 MED ORDER — IODIXANOL 320 MG/ML IV SOLN
INTRAVENOUS | Status: DC | PRN
  Administered 2024-04-04: 110 mL

## 2024-04-04 MED ORDER — LIDOCAINE HCL (PF) 1 % IJ SOLN
INTRAMUSCULAR | Status: DC | PRN
  Administered 2024-04-04 (×2): 15 mL

## 2024-04-04 MED ORDER — SODIUM CHLORIDE 0.9 % IV SOLN: 250.0000 mL | INTRAVENOUS | Status: DC | PRN

## 2024-04-04 MED ORDER — FENTANYL CITRATE (PF) 100 MCG/2ML IJ SOLN
INTRAMUSCULAR | Status: DC | PRN
  Administered 2024-04-04 (×4): 25 ug via INTRAVENOUS

## 2024-04-04 MED ORDER — FENTANYL CITRATE (PF) 100 MCG/2ML IJ SOLN
INTRAMUSCULAR | Status: AC
  Filled 2024-04-04: qty 2

## 2024-04-04 MED ORDER — MORPHINE SULFATE (PF) 2 MG/ML IV SOLN: 1.0000 mg | INTRAVENOUS | Status: DC | PRN

## 2024-04-04 MED ORDER — TRAMADOL HCL 50 MG PO TABS: 50.0000 mg | ORAL_TABLET | ORAL | Status: DC | PRN

## 2024-04-04 MED ORDER — NOREPINEPHRINE 4 MG/250ML-% IV SOLN: 0.0000 ug/min | INTRAVENOUS | Status: DC

## 2024-04-04 MED ORDER — ONDANSETRON HCL 4 MG/2ML IJ SOLN: 4.0000 mg | Freq: Four times a day (QID) | INTRAMUSCULAR | Status: DC | PRN

## 2024-04-04 MED ORDER — PROTAMINE SULFATE 10 MG/ML IV SOLN
INTRAVENOUS | Status: DC | PRN
  Administered 2024-04-04: 90 mg via INTRAVENOUS

## 2024-04-04 MED ORDER — ACETAMINOPHEN 650 MG RE SUPP: 650.0000 mg | Freq: Four times a day (QID) | RECTAL | Status: DC | PRN

## 2024-04-04 MED ORDER — LACTATED RINGERS IV SOLN: INTRAVENOUS | Status: DC | PRN

## 2024-04-04 MED ORDER — OXYCODONE HCL 5 MG PO TABS: 5.0000 mg | ORAL_TABLET | ORAL | Status: DC | PRN

## 2024-04-04 MED ORDER — CEFAZOLIN SODIUM-DEXTROSE 2-4 GM/100ML-% IV SOLN
2.0000 g | Freq: Two times a day (BID) | INTRAVENOUS | Status: AC
  Administered 2024-04-04 – 2024-04-05 (×2): 2 g via INTRAVENOUS
  Filled 2024-04-04 (×2): qty 100

## 2024-04-04 SURGICAL SUPPLY — 4 items
CATH 23 ULTRA DELIVERY (CATHETERS) IMPLANT
SHEATH PINNACLE 8F 10CM (SHEATH) IMPLANT
WIRE EMERALD 3MM-J .035X150CM (WIRE) IMPLANT
WIRE G STIF.035X180 STR (WIRE) IMPLANT

## 2024-04-04 NOTE — Transfer of Care (Signed)
 Immediate Anesthesia Transfer of Care Note  Patient: Danny [name]  Procedure(s) Performed: Transcatheter Aortic Valve Replacement, Transfemoral ECHOCARDIOGRAM, TRANSTHORACIC  Patient Location: Cath Lab  Anesthesia Type:MAC  Level of Consciousness: awake, alert , and oriented  Airway & Oxygen Therapy: Patient Spontanous Breathing  Post-op Assessment: Report given to RN and Post -op Vital signs reviewed and stable  Post vital signs: Reviewed and stable  Last Vitals:  Vitals Value Taken Time  BP 110/55 04/04/24 09:30  Temp 98   Pulse 57 04/04/24 09:35  Resp 15 04/04/24 09:35  SpO2 100 % 04/04/24 09:35  Vitals shown include unfiled device data.  Last Pain:  Vitals:   04/04/24 0648  TempSrc:   PainSc: 0-No pain      Patients Stated Pain Goal: 0 (04/04/24 9351)  Complications: There were no known notable events for this encounter.

## 2024-04-04 NOTE — Op Note (Signed)
 [facility]  TAVR OPERATIVE NOTE   Date of Procedure:  04/04/2024  Preoperative Diagnosis: Cardiogenic shock, acute on chronic systolic heart failure, and severe aortic stenosis  Postoperative Diagnosis: Same   Procedure:   Transcatheter Aortic Valve Replacement - Transfemoral Approach  Edwards Sapien 3 Resilia THV (size 23 mm, model # [id number], serial #)   Co-Surgeons:  Dorise LOIS Fellers, MD, Con Clunes, MD and Lurena Red, MD Anesthesiologist:  Erma  Echocardiographer:  Croitoru  Pre-operative Echo Findings: Severe aortic stenosis Severe left ventricular systolic dysfunction  Post-operative Echo Findings: No paravalvular leak Severe left ventricular systolic dysfunction  Left Heart Catheterization Findings: Left ventricular end-diastolic pressure of   BRIEF CLINICAL NOTE AND INDICATIONS FOR SURGERY  The patient is an 89 year old male with a history of coronary artery disease status post CABG in 2023 consisting of a RIMA to LAD, LIMA to obtuse marginal and vein graft to obtuse marginal, previous ischemic cardiomyopathy, history of stroke, peripheral arterial disease, CKD stage IIIa, left bundle branch block, 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  The patient was stabilized on inotropic therapy and diuretics.  His ejection fraction demonstrates an EF of approximately 15 to 20%.  After medical optimization it was consensus opinion that the patient be referred for urgent transcatheter attic valve replacement given his high risk for decompensation given severe LV dysfunction and significant aortic valvular disease.  During the course of the patient's preoperative work up they have been evaluated comprehensively by a multidisciplinary team of specialists coordinated through the Multidisciplinary Heart Valve Clinic in the  [facility].  They have been demonstrated to suffer from symptomatic severe aortic stenosis as noted above. The patient has been counseled extensively as to the relative risks and benefits of all options for the treatment of severe aortic stenosis including long term medical therapy, conventional surgery for aortic valve replacement, and transcatheter aortic valve replacement.  The patient has been independently evaluated by Dr. Fellers with CT surgery and they are felt to be at high risk for conventional surgical aortic valve replacement. The surgeon indicated the patient would be a poor candidate for conventional surgery. Based upon review of all of the patient's preoperative diagnostic tests they are felt to be candidate for transcatheter aortic valve replacement using the transfemoral approach as an alternative to high risk conventional surgery.    Following the decision to proceed with transcatheter aortic valve replacement, a discussion has been held regarding what types of management strategies would be attempted intraoperatively in the event of life-threatening complications, including whether or not the patient would be considered a candidate for the use of cardiopulmonary bypass and/or conversion to open sternotomy for attempted surgical intervention.  The patient has been advised of a variety of complications that might develop peculiar to this approach including but not limited to risks of death, stroke, paravalvular leak, aortic dissection or other major vascular complications, aortic annulus rupture, device embolization, cardiac rupture or perforation, acute myocardial infarction, arrhythmia, heart block or bradycardia requiring permanent pacemaker placement, congestive heart failure, respiratory failure, renal failure, pneumonia, infection, other late complications related to structural valve deterioration or migration, or other complications that might ultimately cause a  temporary or permanent loss of functional independence or other long term morbidity.  The patient provides full informed consent for the procedure as described and all questions were  answered preoperatively.    DETAILS OF THE OPERATIVE PROCEDURE  PREPARATION:   The patient is brought to the operating room on the above mentioned date and central monitoring was established by the anesthesia team. The patient is placed in the supine position on the operating table.  Intravenous antibiotics are administered. Conscious sedation is used.   Baseline transthoracic echocardiogram was performed. The patient's chest, abdomen, both groins, and both lower extremities are prepared and draped in a sterile manner. A time out procedure is performed.   PERIPHERAL ACCESS:   Using the modified Seldinger technique, femoral arterial and venous access were obtained with placement of a 6 Fr sheath in the left common femoral artery using u/s guidance.  A pigtail diagnostic catheter was passed through the femoral arterial sheath under fluoroscopic guidance into the aortic root.  Aortic root angiography was performed in order to determine the optimal angiographic angle for valve deployment.  TRANSFEMORAL ACCESS:  A micropuncture kit was used to gain access to the right common femoral artery using u/s guidance. Position confirmed with angiography. Pre-closure with double ProGlide closure devices. The patient was heparinized systemically and ACT verified > 250 seconds.    A 14 Fr transfemoral E-sheath was introduced into the right common femoral artery after progressively dilating over an Amplatz superstiff wire. An AL-1 catheter was used to direct a stiff Glidewire across the native aortic valve into the left ventricle. This was exchanged out for a pigtail catheter and position was confirmed in the LV apex. Simultaneous left ventricular, aortic, and left ventricular end-diastolic pressures were recorded.  The pigtail catheter  was then exchanged for an Safari wire in the LV apex.  Direct LV pacing thresholds were assessed and found to be adequate.   TRANSCATHETER HEART VALVE DEPLOYMENT:  An Edwards Sapien 3 THV (size 23 mm) was prepared and crimped per manufacturer's guidelines, and the proper orientation of the valve is confirmed on the [employer] delivery system. The valve was advanced through the introducer sheath using normal technique until in an appropriate position in the abdominal aorta beyond the sheath tip. The balloon was then retracted and using the fine-tuning wheel was centered on the valve. The valve was then advanced across the aortic arch using appropriate flexion of the catheter. The valve was carefully positioned across the aortic valve annulus. The Commander catheter was retracted using normal technique. Once final position of the valve has been confirmed by angiographic assessment, the valve is deployed while temporarily holding ventilation and during rapid ventricular pacing to maintain systolic blood pressure < 50 mmHg and pulse pressure < 10 mmHg. The balloon inflation is held for >3 seconds after reaching full deployment volume. Once the balloon has fully deflated the balloon is retracted into the ascending aorta and valve function is assessed using TTE. There is felt to be no paravalvular leak and no central aortic insufficiency.  The patient's hemodynamic recovery following valve deployment is good.  The deployment balloon and guidewire are both removed. Echo demostrated acceptable post-procedural gradients, stable mitral valve function, and no AI.   PROCEDURE COMPLETION:  The sheath was then removed and closure devices were completed. Protamine  was administered once femoral arterial repair was complete. The temporary pacemaker, pigtail catheters and femoral sheaths were removed with a Mynx closure device placed in the artery and manual pressure used for venous hemostasis.    The patient tolerated  the procedure well and is transported to the surgical intensive care in stable condition. There were no immediate intraoperative  complications. All sponge instrument and needle counts are verified correct at completion of the operation.   No blood products were administered during the operation.  The patient received a total of 110 mL of intravenous contrast during the procedure.  Westen Dinino K Kentravious Lipford MD 04/04/2024 11:15 AM

## 2024-04-04 NOTE — Progress Notes (Addendum)
 PROGRESS NOTE    Danny [name]  FMW:[id number] DOB: 1933-10-10 DOA: 03/25/2024 PCP: [name] LABOR, MD  89/M w systolic CHF, aortic stenosis, CAD, CKD, and bladder cancer sp TURP who presented with generalized weakness and fatigue.  In ED, vol overloaded, cr 2.2 , troponin 375 and 360 , Lactic acid 2.2 , hgb 7.2  Echo with reduced EF,  with signs of hypoperfusion.  Placed on milrinone  for inotropic support.  -9/19 off milrinone . Consulted structural heart team.  -9/22 limited echocardiogram with sever low flow gradient aortic stenosis.  - 9/23 right and left thoracentesis, 1.3 L drained on left and 1.4 on right - 9/24, structural heart team consult, plan for TAVR on 9/30, suggested to keep as inpatient -9/26: underwent TAVR  Subjective: Feels fair, no events overnight  Assessment and Plan:  Acute on chronic systolic CHF Severe MR, TR Severe LFLG Aortic stenosis -Echo w EF < 20%, RV with mod reduced, severe mitral valve regurgitation, moderate to severe tricuspid valve regurgitation, low flow severe aortic valve stenosis with moderate regurgitation, moderate pulmonary valve regurgitation,  - concern for low output on admission, started on milrinone  with diuretics -Weaned off milrinone  on 9/19  - GDMT limited by hypotension - CVP is low, diuretics on hold - Structural heart team following, just underwent TAVR today - Palliative care following, DNR  Abnormal LFTs, shock liver Improving  AKI on CKD 3a - Baseline creatinine 1.3, peaked at 2.7 in the setting of cardiogenic shock - Improving, diuretics on hold now  Coronary artery disease History of CABG 2023, at bedtime troponin was 484, felt to be demand ischemia from CHF - Statin held for shock liver, aspirin  held with severe anemia  PAD (peripheral artery disease) Continue supportive medical care  Macrocytic anemia Thrombocytopenia.  - Could have underlying myeloproliferative disorder, anemia and thrombocytopenia  have been ongoing issues for at least 2 years - B12 -normal - Indirect bilirubin is normal, hemolysis less likely - Hemoglobin trending down further, transfused 1 unit PRBC   Abnormal Bladder on CT - CT notes, Intraluminal gas within the urinary bladder including scattered punctate foci along the posterior bladder wall. 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 -pt denies any new or concerning symptoms, suspect related to bladder CA -will check Urine CX, given TAVR today will start empiric Abx today  History of bladder cancer Follow up as outpatient.   DVT prophylaxis: SCDs Code Status: DNR Family Communication: none present Disposition Plan:   Consultants:    Procedures:   Antimicrobials:    Objective: Vitals:   04/04/24 1145 04/04/24 1150 04/04/24 1155 04/04/24 1220  BP: (!) 96/55 (!) 95/55 (!) 96/57 111/60  Pulse: 60 (!) 51 (!) 55 60  Resp: (!) 5 (!) 8 13 11   Temp:    (!) 97.4 F (36.3 C)  TempSrc:    Oral  SpO2: 99% 99% 98% 100%  Weight:      Height:        Intake/Output Summary (Last 24 hours) at 04/04/2024 1308 Last data filed at 04/04/2024 1100 Gross per 24 hour  Intake 656 ml  Output 150 ml  Net 506 ml   Filed Weights   04/03/24 0446 04/04/24 0404 04/04/24 0633  Weight: 57 kg 53.7 kg 53.7 kg    Examination:  Gen: Awake, Alert, Oriented X 3, chronically ill-appearing, no distress HEENT: no JVD Lungs: Good air movement bilaterally, CTAB CVS: S1S2/RRR systolic murmur Abd:  soft, Non tender, non distended, BS present Extremities: No edema, right arm PICC line Skin: no new rashes on exposed skin     Data Reviewed:   CBC: Recent Labs  Lab 03/29/24 0600 03/31/24 0602 04/03/24 0434 04/04/24 0404  WBC 4.8 6.6 5.7 5.5  HGB 7.7* 8.1* 7.2* 8.3*  HCT 22.8* 24.3* 21.1* 24.7*  MCV 104.1* 105.7* 103.4* 100.8*  PLT 71* 80* 74* 75*   Basic Metabolic  Panel: Recent Labs  Lab 03/30/24 0500 03/31/24 0602 04/01/24 0650 04/02/24 0500 04/03/24 0434 04/04/24 0404  NA 131* 131* 130* 131* 130* 131*  K 4.1 4.7 5.0 4.2 4.1 4.4  CL 101 101 98 99 97* 99  CO2 24 23 22 24 26 25   GLUCOSE 101* 108* 102* 125* 90 100*  BUN 56* 55* 58* 60* 60* 46*  CREATININE 1.76* 1.75* 1.68* 1.92* 1.87* 1.50*  CALCIUM  8.1* 8.2* 8.4* 8.2* 7.9* 8.2*  MG 1.9 1.9  --  1.8  --   --    GFR: Estimated Creatinine Clearance: 26.4 mL/min (A) (by C-G formula based on SCr of 1.5 mg/dL (H)). Liver Function Tests: Recent Labs  Lab 03/29/24 0600 04/01/24 0650 04/03/24 0434  AST 76* 38 26  ALT 158* 86* 50*  ALKPHOS 115 83 71  BILITOT 1.0 1.1 1.1  PROT 6.0* 5.9* 5.2*  ALBUMIN  2.8* 2.6* 2.3*   No results for input(s): LIPASE, AMYLASE in the last 168 hours.  No results for input(s): AMMONIA in the last 168 hours. Coagulation Profile: Recent Labs  Lab 04/03/24 2105  INR 1.2   Cardiac Enzymes: No results for input(s): CKTOTAL, CKMB, CKMBINDEX, TROPONINI in the last 168 hours. BNP (last 3 results) No results for input(s): PROBNP in the last 8760 hours. HbA1C: Recent Labs    04/03/24 2105  HGBA1C 5.1   CBG: No results for input(s): GLUCAP in the last 168 hours.  Lipid Profile: No results for input(s): CHOL, HDL, LDLCALC, TRIG, CHOLHDL, LDLDIRECT in the last 72 hours. Thyroid  Function Tests: No results for input(s): TSH, T4TOTAL, FREET4, T3FREE, THYROIDAB in the last 72 hours. Anemia Panel: Recent Labs    04/02/24 0500  VITAMINB12 662   Urine analysis:    Component Value Date/Time   COLORURINE YELLOW 04/04/2024 0340   APPEARANCEUR CLOUDY (A) 04/04/2024 0340   LABSPEC 1.017 04/04/2024 0340   PHURINE 5.0 04/04/2024 0340   GLUCOSEU NEGATIVE 04/04/2024 0340   HGBUR SMALL (A) 04/04/2024 0340   BILIRUBINUR NEGATIVE 04/04/2024 0340   BILIRUBINUR large (A) 10/20/2022 1608   BILIRUBINUR negative 01/19/2022 0924    KETONESUR NEGATIVE 04/04/2024 0340   PROTEINUR 30 (A) 04/04/2024 0340   UROBILINOGEN 4.0 (A) 10/20/2022 1608   UROBILINOGEN 0.2 11/28/2013 2032   NITRITE NEGATIVE 04/04/2024 0340   LEUKOCYTESUR LARGE (A) 04/04/2024 0340   Sepsis Labs: @LABRCNTIP (procalcitonin:4,lacticidven:4)  ) Recent Results (from the past 240 hours)  Resp panel by RT-PCR (RSV, Flu A&B, Covid) Anterior Nasal Swab     Status: None   Collection Time: 03/25/24  7:20 PM   Specimen: Anterior Nasal Swab  Result Value Ref Range Status   SARS Coronavirus 2 by RT PCR NEGATIVE NEGATIVE Final    Comment: (NOTE) SARS-CoV-2 target nucleic acids are NOT DETECTED.  The SARS-CoV-2 RNA is generally detectable in upper respiratory specimens during the acute phase of infection. The lowest concentration of SARS-CoV-2 viral copies this assay can detect is 138 copies/mL. A negative result does not preclude SARS-Cov-2 infection and should not be used as the sole  basis for treatment or other patient management decisions. A negative result may occur with  improper specimen collection/handling, submission of specimen other than nasopharyngeal swab, presence of viral mutation(s) within the areas targeted by this assay, and inadequate number of viral copies(<138 copies/mL). A negative result must be combined with clinical observations, patient history, and epidemiological information. The expected result is Negative.  Fact Sheet for Patients:  BloggerCourse.com  Fact Sheet for Healthcare Providers:  SeriousBroker.it  This test is no t yet approved or cleared by the [place]  FDA and  has been authorized for detection and/or diagnosis of SARS-CoV-2 by FDA under an Emergency Use Authorization (EUA). This EUA will remain  in effect (meaning this test can be used) for the duration of the COVID-19 declaration under Section 564(b)(1) of the Act, 21 U.S.C.section 360bbb-3(b)(1), unless  the authorization is terminated  or revoked sooner.       Influenza A by PCR NEGATIVE NEGATIVE Final   Influenza B by PCR NEGATIVE NEGATIVE Final    Comment: (NOTE) The Xpert Xpress SARS-CoV-2/FLU/RSV plus assay is intended as an aid in the diagnosis of influenza from Nasopharyngeal swab specimens and should not be used as a sole basis for treatment. Nasal washings and aspirates are unacceptable for Xpert Xpress SARS-CoV-2/FLU/RSV testing.  Fact Sheet for Patients: BloggerCourse.com  Fact Sheet for Healthcare Providers: SeriousBroker.it  This test is not yet approved or cleared by the [place]  FDA and has been authorized for detection and/or diagnosis of SARS-CoV-2 by FDA under an Emergency Use Authorization (EUA). This EUA will remain in effect (meaning this test can be used) for the duration of the COVID-19 declaration under Section 564(b)(1) of the Act, 21 U.S.C. section 360bbb-3(b)(1), unless the authorization is terminated or revoked.     Resp Syncytial Virus by PCR NEGATIVE NEGATIVE Final    Comment: (NOTE) Fact Sheet for Patients: BloggerCourse.com  Fact Sheet for Healthcare Providers: SeriousBroker.it  This test is not yet approved or cleared by the [place]  FDA and has been authorized for detection and/or diagnosis of SARS-CoV-2 by FDA under an Emergency Use Authorization (EUA). This EUA will remain in effect (meaning this test can be used) for the duration of the COVID-19 declaration under Section 564(b)(1) of the Act, 21 U.S.C. section 360bbb-3(b)(1), unless the authorization is terminated or revoked.  Performed at [facility], [address], [place], [place] [address]   Respiratory (~20 pathogens) panel by PCR     Status: None   Collection Time: 03/26/24  4:04 AM   Specimen: Nasopharyngeal Swab; Respiratory  Result Value Ref  Range Status   Adenovirus NOT DETECTED NOT DETECTED Final   Coronavirus 229E NOT DETECTED NOT DETECTED Final    Comment: (NOTE) The Coronavirus on the Respiratory Panel, DOES NOT test for the novel  Coronavirus (2019 nCoV)    Coronavirus HKU1 NOT DETECTED NOT DETECTED Final   Coronavirus NL63 NOT DETECTED NOT DETECTED Final   Coronavirus OC43 NOT DETECTED NOT DETECTED Final   Metapneumovirus NOT DETECTED NOT DETECTED Final   Rhinovirus / Enterovirus NOT DETECTED NOT DETECTED Final   Influenza A NOT DETECTED NOT DETECTED Final   Influenza B NOT DETECTED NOT DETECTED Final   Parainfluenza Virus 1 NOT DETECTED NOT DETECTED Final   Parainfluenza Virus 2 NOT DETECTED NOT DETECTED Final   Parainfluenza Virus 3 NOT DETECTED NOT DETECTED Final   Parainfluenza Virus 4 NOT DETECTED NOT DETECTED Final   Respiratory Syncytial Virus NOT DETECTED NOT DETECTED Final   Bordetella  pertussis NOT DETECTED NOT DETECTED Final   Bordetella Parapertussis NOT DETECTED NOT DETECTED Final   Chlamydophila pneumoniae NOT DETECTED NOT DETECTED Final   Mycoplasma pneumoniae NOT DETECTED NOT DETECTED Final    Comment: Performed at [facility], [address], [place], [place] [address]  Surgical pcr screen     Status: None   Collection Time: 04/03/24  7:51 PM   Specimen: Nasal Mucosa; Nasal Swab  Result Value Ref Range Status   MRSA, PCR NEGATIVE NEGATIVE Final   Staphylococcus aureus NEGATIVE NEGATIVE Final    Comment: (NOTE) The Xpert SA Assay (FDA approved for NASAL specimens in patients 16 years of age and older), is one component of a comprehensive surveillance program. It is not intended to diagnose infection nor to guide or monitor treatment. Performed at [facility], [address], [place], [place] [address]      Radiology Studies: Structural Heart Procedure Result Date: 04/04/2024 See surgical note for result.    Scheduled Meds:  Chlorhexidine  Gluconate Cloth  6 each Topical  Daily   Influenza vac split trivalent PF  0.5 mL Intramuscular Tomorrow-1000   melatonin  3 mg Oral QHS   rosuvastatin   10 mg Oral Daily   sodium chloride  flush  3 mL Intravenous Q12H   sodium chloride  flush  3 mL Intravenous Q12H   Continuous Infusions:  sodium chloride      sodium chloride       ceFAZolin  (ANCEF ) IV     norepinephrine  (LEVOPHED ) Adult infusion Stopped (04/04/24 1015)     LOS: 9 days    Time spent:    Sigurd Pac, MD [facility]   04/04/2024, 1:08 PM

## 2024-04-04 NOTE — Progress Notes (Signed)
 PHARMACY NOTE:  ANTIMICROBIAL RENAL DOSAGE ADJUSTMENT  Current antimicrobial regimen includes a mismatch between antimicrobial dosage and estimated renal function.  As per policy approved by the Pharmacy & Therapeutics and Medical Executive Committees, the antimicrobial dosage will be adjusted accordingly.  Current antimicrobial dosage:  Cefazolin  2gm IV q8h x 2 doses  Indication: surgical prophylaxis  Renal Function:  Estimated Creatinine Clearance: 26.4 mL/min (A) (by C-G formula based on SCr of 1.5 mg/dL (H)). []      On intermittent HD, scheduled: []      On CRRT    Antimicrobial dosage has been changed to:  Cefazolin  2gm IV q12h x 2 doses  Additional comments:   Thank you for allowing pharmacy to be a part of this patient's care.  Ka Flammer A. Lyle, PharmD, BCPS, FNKF Clinical Pharmacist [place] Please utilize Amion for appropriate phone number to reach the unit pharmacist [facility])

## 2024-04-04 NOTE — Progress Notes (Incomplete)
 ADVANCED HF CLINIC CONSULT NOTE  Referring Physician: [name] LABOR, MD Primary Care: [name] LABOR, MD Primary Cardiologist: None  Chief Complaint:  HPI:     Past Medical History:  Diagnosis Date   (HFpEF) heart failure with preserved ejection fraction (HCC)    Echo 1/23: Inferior AK, mild aortic stenosis (mean 9 mmHg, V-max 210.5 cm/s, DI 0.70), EF 55-60, GR 1 DD, normal RVSF, normal PASP, trivial MR, mild AI, borderline dilation of aortic root (39 mm)   Acid reflux    hiatal hernia   Anemia    Bladder cancer (HCC)    CAD (coronary artery disease)    S/p non-STEMI 1/23 >> s/p CABG   Carotid artery disease 08/23/2021   Pre-CABG Dopplers 1/23:R ICA 40-59; L ICA 1-39 // Carotid US  07/20/2023: RICA 40-59; LICA 1-39   CHF (congestive heart failure) (HCC)    Chronic kidney disease (CKD)    Coronary artery disease involving native coronary artery of native heart without angina pectoris 05/16/2022   Inf NSTEMI s/p CABG in 07/2021 (RIMA-LAD, LIMA-OM1, S-OM2)   Diverticulitis    Erectile dysfunction 04/20/2015   Essential hypertension 04/20/2015   History of colon polyps    History of kidney stones    History of stroke    Noted on brain MRI 07/2021   Hyperlipidemia 05/28/2015   PAD (peripheral artery disease)    Pre-CABG Dopplers 1/23: Right ABI 0.65; left ABI 0.82   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 Wendel and Dr. Aneita   Skin cancer    Stroke [facility])    noted MRI 2023 pt. unaware    No current facility-administered medications for this visit.   No current outpatient medications on file.   Facility-Administered Medications Ordered in Other Visits  Medication Dose Route Frequency Provider Last Rate Last Admin   0.9 %  sodium chloride  infusion   Intravenous Continuous Sebastian Lamarr SAUNDERS, PA-C       0.9 %  sodium chloride  infusion  250 mL Intravenous PRN Thompson,  Kathryn R, PA-C       acetaminophen  (TYLENOL ) tablet 650 mg  650 mg Oral Q6H PRN Thompson, Kathryn R, PA-C       Or   acetaminophen  (TYLENOL ) suppository 650 mg  650 mg Rectal Q6H PRN Thompson, Kathryn R, PA-C       ceFAZolin  (ANCEF ) IVPB 2g/100 mL premix  2 g Intravenous Q12H Sebastian Lamarr SAUNDERS, PA-C       Chlorhexidine  Gluconate Cloth 2 % PADS 6 each  6 each Topical Daily Claudene Reeves A, MD   6 each at 04/03/24 0945   guaiFENesin -dextromethorphan  (ROBITUSSIN DM) 100-10 MG/5ML syrup 5 mL  5 mL Oral Q4H PRN Arrien, Elidia Sieving, MD       Influenza vac split trivalent PF (FLUZONE HIGH-DOSE) injection 0.5 mL  0.5 mL Intramuscular Tomorrow-1000 Arrien, Elidia Sieving, MD       lip balm (CARMEX) ointment 1 Application  1 Application Topical PRN Arrien, Mauricio Daniel, MD       melatonin tablet 3 mg  3 mg Oral QHS Joseph, Preetha, MD   3 mg at 04/03/24 2333   morphine  (PF) 2 MG/ML injection 1-4 mg  1-4 mg Intravenous Q1H PRN Thompson, Kathryn R, PA-C       norepinephrine  (LEVOPHED ) 4mg  in (0.016 mg/mL) premix infusion  0-10 mcg/min Intravenous Titrated Sebastian Lamarr SAUNDERS, PA-C  Stopped at 04/04/24 1015   ondansetron  (ZOFRAN ) injection 4 mg  4 mg Intravenous Q6H PRN Thompson, Kathryn R, PA-C       Oral care mouth rinse  15 mL Mouth Rinse PRN Claudene Maximino LABOR, MD       oxyCODONE  (Oxy IR/ROXICODONE ) immediate release tablet 5-10 mg  5-10 mg Oral Q3H PRN Thompson, Kathryn R, PA-C       rosuvastatin  (CRESTOR ) tablet 10 mg  10 mg Oral Daily Arrien, Elidia Sieving, MD   10 mg at 04/03/24 1242   senna (SENOKOT) tablet 8.6 mg  1 tablet Oral Daily PRN Opyd, Timothy S, MD   8.6 mg at 04/02/24 2206   sodium chloride  flush (NS) 0.9 % injection 10-40 mL  10-40 mL Intracatheter PRN Claudene Maximino LABOR, MD   10 mL at 04/02/24 2211   sodium chloride  flush (NS) 0.9 % injection 3 mL  3 mL Intravenous Q12H Smith, Rondell A, MD   3 mL at 04/04/24 0410   sodium chloride  flush (NS) 0.9 % injection 3 mL  3 mL  Intravenous Q12H Sebastian Lamarr SAUNDERS, PA-C       sodium chloride  flush (NS) 0.9 % injection 3 mL  3 mL Intravenous PRN Thompson, Kathryn R, PA-C       traMADol  (ULTRAM ) tablet 50-100 mg  50-100 mg Oral Q4H PRN Sebastian Lamarr SAUNDERS, PA-C       trimethobenzamide  (TIGAN ) injection 200 mg  200 mg Intramuscular Q6H PRN Claudene Maximino LABOR, MD        Allergies  Allergen Reactions   Lipitor [Atorvastatin]     Muscle soreness      Social History   Socioeconomic History   Marital status: Married    Spouse name: Not on file   Number of children: 1   Years of education: 12   Highest education level: Not on file  Occupational History   Occupation: Retired  Tobacco Use   Smoking status: Former    Types: Cigars   Smokeless tobacco: Never   Tobacco comments:    Quit 2016  Vaping Use   Vaping status: Never Used  Substance and Sexual Activity   Alcohol use: Yes    Comment: Occasional beer   Drug use: No   Sexual activity: Not Currently  Other Topics Concern   Not on file  Social History Narrative   Lives with wife and daughter lives with them also/2025   Caffeine use: 1-2 cups coffee in the am and 1 glass tea qpm   Social Drivers of Health   Financial Resource Strain: Medium Risk (11/23/2023)   Overall Financial Resource Strain (CARDIA)    Difficulty of Paying Living Expenses: Somewhat hard  Food Insecurity: No Food Insecurity (03/26/2024)   Hunger Vital Sign    Worried About Running Out of Food in the Last Year: Never true    Ran Out of Food in the Last Year: Never true  Transportation Needs: No Transportation Needs (03/26/2024)   PRAPARE - Administrator, Civil Service (Medical): No    Lack of Transportation (Non-Medical): No  Physical Activity: Inactive (11/23/2023)   Exercise Vital Sign    Days of Exercise per Week: 0 days    Minutes of Exercise per Session: 0 min  Stress: No Stress Concern Present (11/23/2023)   [employer] - Occupational  Stress Questionnaire    Feeling of Stress : Only a little  Social Connections: Moderately Isolated (03/26/2024)   Social Connection and  Isolation Panel    Frequency of Communication with Friends and Family: Once a week    Frequency of Social Gatherings with Friends and Family: Never    Attends Religious Services: 1 to 4 times per year    Active Member of [employer] or Organizations: No    Attends Banker Meetings: Never    Marital Status: Married  Catering manager Violence: Not At Risk (03/26/2024)   Humiliation, Afraid, Rape, and Kick questionnaire    Fear of Current or Ex-Partner: No    Emotionally Abused: No    Physically Abused: No    Sexually Abused: No      Family History  Problem Relation Age of Onset   Stroke Father 61   Sudden death Mother 63       unknown cause    There were no vitals filed for this visit.  PHYSICAL EXAM: General:  Well appearing. No respiratory difficulty HEENT: normal Neck: supple. no JVD. Carotids 2+ bilat; no bruits. No lymphadenopathy or thryomegaly appreciated. Cor: PMI nondisplaced. Regular rate & rhythm. No rubs, gallops or murmurs. Lungs: clear Abdomen: soft, nontender, nondistended. No hepatosplenomegaly. No bruits or masses. Good bowel sounds. Extremities: no cyanosis, clubbing, rash, edema Neuro: alert & oriented x 3, cranial nerves grossly intact. moves all 4 extremities w/o difficulty. Affect pleasant.  ECG:   ASSESSMENT & PLAN:   [facility] Pa/Np

## 2024-04-04 NOTE — Anesthesia Procedure Notes (Addendum)
 Arterial Line Insertion Start/End9/26/2025 7:00 AM, 04/04/2024 7:05 AM Performed by: Zelphia Norleen HERO, CRNA, CRNA  Patient location: Pre-op. Preanesthetic checklist: patient identified, IV checked, site marked, risks and benefits discussed, surgical consent, monitors and equipment checked, pre-op evaluation, timeout performed and anesthesia consent Lidocaine  1% used for infiltration Left, radial was placed Catheter size: 20 G Hand hygiene performed  and maximum sterile barriers used   Attempts: 1 Procedure performed using ultrasound guided technique. Ultrasound Notes:anatomy identified, needle tip was noted to be adjacent to the nerve/plexus identified, no ultrasound evidence of intravascular and/or intraneural injection and image(s) printed for medical record Following insertion, dressing applied and Biopatch. Post procedure assessment: normal and unchanged  Patient tolerated the procedure well with no immediate complications.     Media Information  Document Information  Photos  Left radial Aline  04/04/2024 07:04  Attached To:  Hospital Encounter on 03/25/24  Source Information  Radek Carnero, Norleen HERO, CRNA  [facility]

## 2024-04-04 NOTE — Op Note (Signed)
 [facility]   MULTIDISCIPLINARY HEART VALVE TEAM   TAVR OPERATIVE NOTE   Date of Procedure:  04/04/2024  Preoperative Diagnosis: Severe Aortic Stenosis   Postoperative Diagnosis: Same   Procedure:   Transcatheter Aortic Valve Replacement - Percutaneous Transfemoral Approach  Edwards Sapien 3 Ultra Resilia (size 23 mm, model # 9755RSL, serial # [id number])   Co-Surgeons:  Danny Clunes, MD, Danny Fellers, MD and Danny Red, MD   Anesthesiologist:  Erma, MD  Echocardiographer:  Delford, MD  Pre-operative Echo Findings: Severe aortic stenosis Severely decreased left ventricular systolic function  Post-operative Echo Findings: No paravalvular leak Unchanged poor left ventricular systolic function   BRIEF CLINICAL NOTE AND INDICATIONS FOR SURGERY  Danny [name] is a 89 y.o. male with a hx of CAD w/ prior NSTEMI 07/2021 s/p CABG (RIMA to LAD, LIMA to OM1, SVG to OM2), ischemic cardiomyopathy, hx CVA, PAD, bladder cancer, CKD IIIa, LBBB and aortic valve stenosis admitted with cardiogenic shock who is being seen today for the evaluation of severe LFLG AS.   Last echo 12/2023 showed EF 50-55%, RWMA w/ basal inferior and basal inferoseptal severe HK, RV okay, moderate AS-severe severely calcified valve with mean gradient of 23 mmHg and AVA 1.1 cm2 by VTI.   Danny [name] lives at home with his wife who suffers from dementia and is her primary caregiver. He was previously very independent driving, grocery shopping and doing day to day ADLs without difficulty. Over the past several weeks he has suffered a rapid decline with worsening dyspnea, weakness, anorexia and abdominal bloating. He presented to the ED 03/25/24 where labs were significant for: Scr 2.2, K 5.4, Na 133, lipase 100, AST 205, ALT 207, HS troponin 484, lactic acid 2.2>2.3>1.9, hgb 7.2, BNP > 4500. ECG with SR, known LBBB w/ QRS > 150 ms, PACs. FOBT negative. Abdominal US  with evidence of biliary sludge and  gallbladder wall thickening. He was given IVF, tylenol  and lokelma .    He was admitted for evaluation and management of acute on chronic HFpEF, acute on chronic anemia, AKI on CKD IIIb and elevated LFTs. 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 Advanced Heart Failure asked to see to assist with management. Repeat TTE 03/26/24 w/ LVEF <20%, G2DD, moderate RV dysfunction, severe MR, mod-severe TR, moderate PR, moderate pleural effusion in lateral region, and severe LFLG aortic stenosis with mean gradient 17 mm hg, Vmax 2.85 m/s, AVA 0.77cm2, and moderate AI. He was eventually weaned off milrinone  and diuresed to euvolemia. Repeat limited echo 03/31/24 showed EF 20%, LV with GHK, RV mildly reduced (improved), LA mildly dilated, mod MR, AV mean gradient 19 mmHg, severe aortic stenosis.  Structural heart consulted for consideration of TAVR. His creat improved and we were able to get pre TAVR CT scans which showed anatomical characteristics consistent with aortic stenosis suitable for treatment by transcatheter aortic valve replacement without any significant complicating features and adequate pelvic vascular access to facilitate a transfemoral approach. Cardiac CT demonstrated patent RIMA-LAD and LIMA-OM1 as well as an occluded SVG-->OM which was not felt to explain his cardiomyopathy. Dr. Red did not think that coronary angiography was necessay as it would likely not alter management. Large bilateral pleural effusions were noted on CTs and he is now s/p -1.3L on the L and -1.4L on the R thoracentesis 04/01/24 with marked symptomatic improvement. LFTs have improved and creat stable around 1.87. Hg 7.2 today and being transfused.    DETAILS OF  THE OPERATIVE PROCEDURE  PREPARATION:    The patient was brought to the operating room on the above mentioned date and appropriate monitoring was established by the anesthesia team. The patient was placed in the  supine position on the operating table.  Intravenous antibiotics were administered. The patient was monitored closely throughout the procedure under conscious sedation.  Baseline transthoracic echocardiogram was performed. The patient's abdomen and both groins were prepped and draped in a sterile manner. A time out procedure was performed.   PERIPHERAL ACCESS:    Using the modified Seldinger technique, femoral arterial access was obtained with placement of a 6 Fr sheath on the left side.  A pigtail diagnostic catheter was passed through the arterial sheath under fluoroscopic guidance into the aortic root.  Aortic root angiography was performed in order to determine the optimal angiographic angle for valve deployment.   TRANSFEMORAL ACCESS:   Percutaneous transfemoral access and sheath placement was performed using ultrasound guidance.  The right common femoral artery was cannulated using a micropuncture needle and appropriate location was verified using hand injection angiogram.  A pair of Abbott Perclose percutaneous closure devices were placed and a 6 French sheath replaced into the femoral artery.  The patient was heparinized systemically and ACT verified > 250 seconds.    An 8 Fr transfemoral E-sheath was introduced into the right common femoral artery after progressively dilating over an Amplatz superstiff wire. An AL-1 catheter was used to direct a straight-tip exchange length wire across the native aortic valve into the left ventricle. This was exchanged out for a pigtail catheter and position was confirmed in the LV apex. Simultaneous LV and Ao pressures were recorded.  The LVEDP was 20 mmHg.  The pigtail catheter was exchanged for a Safari wire in the LV apex.    TRANSCATHETER HEART VALVE DEPLOYMENT:   An Edwards Sapien 3 Ultra transcatheter heart valve (23 mm) was prepared and crimped per manufacturer's guidelines, and the proper orientation of the valve was confirmed on the [employer] delivery system. The valve was advanced through the introducer sheath using normal technique until in an appropriate position in the abdominal aorta beyond the sheath tip. The balloon was then retracted and using the fine-tuning wheel was centered on the valve. The valve was then advanced across the aortic arch using appropriate flexion of the catheter. The valve was carefully positioned across the aortic valve annulus. The Commander catheter was retracted using normal technique. Once final position of the valve was confirmed by angiographic assessment, the valve was deployed during rapid ventricular pacing via direct LV pacing to maintain systolic blood pressure < 50 mmHg and pulse pressure < 10 mmHg. The balloon inflation was held for >3 seconds after reaching full deployment volume. Once the balloon was fully deflated the balloon was retracted into the ascending aorta and valve function was assessed using echocardiography. There was felt to be no paravalvular leak and no central aortic insufficiency.  The patient's hemodynamic recovery following valve deployment was good.  The deployment balloon and guidewire were both removed.    PROCEDURE COMPLETION:   The sheath was removed and femoral artery closure performed.  Protamine  was administered once femoral arterial repair was complete.  A Mynx femoral closure device was utilized following removal of the diagnostic sheath in the left femoral artery.  The patient tolerated the procedure well and was transported to the cath lab recovery area in stable condition. There were no immediate intraoperative complications. All sponge instrument and needle counts  were verified correct at completion of the operation.   No blood products were administered during the operation.  The patient received a total of 110 mL of intravenous contrast during the procedure.   Danny GORMAN Clunes, MD 04/04/2024 10:26 AM

## 2024-04-04 NOTE — Interval H&P Note (Signed)
 History and Physical Interval Note:  04/04/2024 6:41 AM  Danny [name]  has presented today for surgery, with the diagnosis of Severe Aortic Stenosis.  The various methods of treatment have been discussed with the patient and family. After consideration of risks, benefits and other options for treatment, the patient has consented to  Procedure(s): Transcatheter Aortic Valve Replacement, Transfemoral (N/A) ECHOCARDIOGRAM, TRANSTHORACIC (N/A) as a surgical intervention.  The patient's history has been reviewed, patient examined, no change in status, stable for surgery.  I have reviewed the patient's chart and labs.  Questions were answered to the patient's satisfaction.     Kathy Wahid K Elspeth Blucher

## 2024-04-04 NOTE — Progress Notes (Signed)
  [facility]   MULTIDISCIPLINARY HEART VALVE TEAM  Patient doing well s/p TAVR. He is hemodynamically stable. Groin sites stable. ECG with sinus brady and old LBBB No high grade block. Plan to transfer to from cath lab holding to 4E when bed available. POD 1 echo to be completed tomorrow. I will arrange 1 month follow up with echo in structural clinic.    Of note, pre TAVR scans showed intraluminal gas within the urinary bladder including scattered punctate foci along the posterior bladder wall. 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 UA 9/25 showed small hg, large leukocytes and many bacteria. Pt has known history of bladder cancer. I discussed with Dr. Freida who will review and decide if Abx treatment is indicated.     Lamarr Hummer PA-C  [facility]  Pager [phone]

## 2024-04-04 NOTE — Anesthesia Preprocedure Evaluation (Signed)
 Anesthesia Evaluation  Patient identified by MRN, date of birth, ID band Patient awake    Reviewed: Allergy & Precautions, NPO status , Patient's Chart, lab work & pertinent test results  History of Anesthesia Complications Negative for: history of anesthetic complications  Airway Mallampati: II  TM Distance: >3 FB Neck ROM: Full    Dental  (+) Edentulous Upper, Edentulous Lower   Pulmonary neg pulmonary ROS, former smoker   Pulmonary exam normal        Cardiovascular hypertension, Pt. on medications and Pt. on home beta blockers + CAD, + CABG, + Peripheral Vascular Disease and +CHF  + Valvular Problems/Murmurs AS  Rhythm:Regular Rate:Normal  IMPRESSIONS     1. Left ventricular ejection fraction, by estimation, is 20%. The left  ventricle has severely decreased function. The left ventricle demonstrates  global hypokinesis. The left ventricular internal cavity size was mildly  dilated. No LV thrombus noted.   2. Right ventricular systolic function is mildly reduced. The right  ventricular size is normal. Tricuspid regurgitation signal is inadequate  for assessing PA pressure.   3. Left atrial size was mildly dilated.   4. The mitral valve is degenerative. Moderate mitral valve regurgitation.  No evidence of mitral stenosis. Moderate mitral annular calcification.   5. The aortic valve is tricuspid. There is severe calcifcation of the  aortic valve. Aortic valve regurgitation is mild. Aortic valve mean  gradient measures 19.0 mmHg. Visually, I suspect low flow/low gradient  severe aortic stenosis. However, no LVOT VTI   measurement was made, unable to calculate aortic valve area. Would  suggest repeat study with fully interrogation of the aortic valve.   6. The inferior vena cava is normal in size with <50% respiratory  variability, suggesting right atrial pressure of 8 mmHg.   7. Left pleural effusion present.      Neuro/Psych CVA, Residual Symptoms  negative psych ROS   GI/Hepatic Neg liver ROS,GERD  ,,  Endo/Other  negative endocrine ROS    Renal/GU CRFRenal diseaseLab Results      Component                Value               Date                      NA                       138                 10/04/2023                K                        4.9                 10/04/2023                CO2                      25                  10/04/2023                GLUCOSE                  77  10/04/2023                BUN                      31 (H)              10/04/2023                CREATININE               1.20                10/04/2023                CALCIUM                   9.3                 10/04/2023                GFR                      54.78 (L)           10/10/2022                EGFR                     42 (L)              12/09/2021                GFRNONAA                 59 (L)              10/04/2023            Bladder dysfunction  Bladder Ca    Musculoskeletal negative musculoskeletal ROS (+)    Abdominal Normal abdominal exam  (+)   Peds  Hematology  (+) Blood dyscrasia, anemia Lab Results      Component                Value               Date                      WBC                      4.0                 10/04/2023                HGB                      9.9 (L)             10/04/2023                HCT                      30.3 (L)            10/04/2023                MCV                      106.3 (H)           10/04/2023  PLT                      130 (L)             10/04/2023              Anesthesia Other Findings   Reproductive/Obstetrics                              Anesthesia Physical Anesthesia Plan  ASA: 4  Anesthesia Plan: MAC   Post-op Pain Management:    Induction: Intravenous  PONV Risk Score and Plan: 2 and Ondansetron  and Treatment may vary due to age or medical  condition  Airway Management Planned:   Additional Equipment:   Intra-op Plan:   Post-operative Plan:   Informed Consent: I have reviewed the patients History and Physical, chart, labs and discussed the procedure including the risks, benefits and alternatives for the proposed anesthesia with the patient or authorized representative who has indicated his/her understanding and acceptance.   Patient has DNR.  Discussed DNR with patient and Suspend DNR.   Dental advisory given  Plan Discussed with: CRNA  Anesthesia Plan Comments: (See PAT note 10/04/2023)        Anesthesia Quick Evaluation

## 2024-04-04 NOTE — Plan of Care (Signed)
     Referral previously received for Danny [name] for goals of care discussion. Noted most recent palliative in-person assessment dated 03/29/2024 at which time it was recommended to follow-up in 2 days.  Chart reviewed for Recent provider notes, nurse notes, vitals, and labs and updates received from RN.   At this time patient appears stable.  I plan was to see the patient today, however he is now down in the cardiac catheterization lab to undergo TAVR procedure.  Per previous palliative notes goals were for DNR-limited, full scope of care to attempt to get better and back home to his wife and daughter.  I will be off service for the next 2 days, but somebody from palliative medicine is available for any significant change or new palliative needs.  If the patient remains in the hospital on Monday, 04/07/2024 I will follow-up at that time.  Again, please contact the palliative medicine team for any significant clinical change or new palliative needs in the interim.  Thank you for your referral and allowing [facility] to assist in Danny [name]'s care.   Camellia Kays, NP Palliative Medicine Team Phone: [phone]  NO CHARGE

## 2024-04-04 NOTE — Anesthesia Postprocedure Evaluation (Signed)
 Anesthesia Post Note  Patient: Danny [name]  Procedure(s) Performed: Transcatheter Aortic Valve Replacement, Transfemoral ECHOCARDIOGRAM, TRANSTHORACIC     Patient location during evaluation: Cath Lab Anesthesia Type: MAC Level of consciousness: awake and alert Pain management: pain level controlled Vital Signs Assessment: post-procedure vital signs reviewed and stable Respiratory status: spontaneous breathing, nonlabored ventilation, respiratory function stable and patient connected to nasal cannula oxygen Cardiovascular status: stable and blood pressure returned to baseline Postop Assessment: no apparent nausea or vomiting Anesthetic complications: no   There were no known notable events for this encounter.  Last Vitals:  Vitals:   04/04/24 1035 04/04/24 1040  BP:    Pulse:  (!) 52  Resp: 18 18  Temp:    SpO2:  100%    Last Pain:  Vitals:   04/04/24 0930  TempSrc: Temporal  PainSc: 0-No pain                 Thom SAUNDERS Peoples

## 2024-04-04 NOTE — Progress Notes (Signed)
 Advanced Heart Failure Rounding Note  Cardiologist: None  Chief Complaint: Heart Failure/AS Subjective:   9/17- Diuresed with IV lasix .  S/p -1.3L on the L and -1.4L on the R Thoracentesis 04/01/24  - S/P TAVR 04/04/24 - Waking up post procedure; reports feeling 'amazing'.   Objective:   Weight Range: 53.7 kg Body mass index is 20.32 kg/m.   Vital Signs:   Temp:  [97.7 F (36.5 C)-98.5 F (36.9 C)] 98.5 F (36.9 C) (09/26 0930) Pulse Rate:  [39-90] 55 (09/26 1155) Resp:  [5-28] 13 (09/26 1155) BP: (78-114)/(46-81) 96/57 (09/26 1155) SpO2:  [94 %-100 %] 98 % (09/26 1155) FiO2 (%):  [100 %] 100 % (09/25 1200) Weight:  [53.7 kg] 53.7 kg (09/26 0633) Last BM Date : 03/31/24  Weight change: Filed Weights   04/03/24 0446 04/04/24 0404 04/04/24 9366  Weight: 57 kg 53.7 kg 53.7 kg    Intake/Output:   Intake/Output Summary (Last 24 hours) at 04/04/2024 1157 Last data filed at 04/04/2024 1100 Gross per 24 hour  Intake 893 ml  Output 450 ml  Net 443 ml    Physical Exam  General:  elderly appearing.   Neck: JVP 8-10 Cor: RRR; no murmur Lungs: CTA Extremities: no edema PICC RUE Neuro: alert & oriented x 3. Affect pleasant.   Telemetry  A fib  vs NSR 70s, intt PVCs (Personally reviewed)    Labs    CBC Recent Labs    04/03/24 0434 04/04/24 0404  WBC 5.7 5.5  HGB 7.2* 8.3*  HCT 21.1* 24.7*  MCV 103.4* 100.8*  PLT 74* 75*   Basic Metabolic Panel Recent Labs    90/75/74 0500 04/03/24 0434 04/04/24 0404  NA 131* 130* 131*  K 4.2 4.1 4.4  CL 99 97* 99  CO2 24 26 25   GLUCOSE 125* 90 100*  BUN 60* 60* 46*  CREATININE 1.92* 1.87* 1.50*  CALCIUM  8.2* 7.9* 8.2*  MG 1.8  --   --    Liver Function Tests Recent Labs    04/03/24 0434  AST 26  ALT 50*  ALKPHOS 71  BILITOT 1.1  PROT 5.2*  ALBUMIN  2.3*    No results for input(s): LIPASE, AMYLASE in the last 72 hours.  Cardiac Enzymes No results for input(s): CKTOTAL, CKMB, CKMBINDEX,  TROPONINI in the last 72 hours.  BNP: BNP (last 3 results) Recent Labs    03/26/24 0404  BNP >4,500.0*    ProBNP (last 3 results) No results for input(s): PROBNP in the last 8760 hours.   D-Dimer No results for input(s): DDIMER in the last 72 hours. Hemoglobin A1C Recent Labs    04/03/24 2105  HGBA1C 5.1   Fasting Lipid Panel No results for input(s): CHOL, HDL, LDLCALC, TRIG, CHOLHDL, LDLDIRECT in the last 72 hours. Thyroid  Function Tests No results for input(s): TSH, T4TOTAL, T3FREE, THYROIDAB in the last 72 hours.  Invalid input(s): FREET3  Other results:  Imaging   Structural Heart Procedure Result Date: 04/04/2024 See surgical note for result.    Medications:   Scheduled Medications:  [MAR Hold] Chlorhexidine  Gluconate Cloth  6 each Topical Daily   Influenza vac split trivalent PF  0.5 mL Intramuscular Tomorrow-1000   magnesium  sulfate  40 mEq Other To OR   [MAR Hold] melatonin  3 mg Oral QHS   potassium chloride   80 mEq Other To OR   [MAR Hold] rosuvastatin   10 mg Oral Daily   [MAR Hold] sodium chloride  flush  3 mL Intravenous  Q12H    Infusions:  sodium chloride      heparin  30,000 units/NS 1000 mL solution for CELLSAVER     norepinephrine  (LEVOPHED ) Adult infusion Stopped (04/04/24 1015)     PRN Medications: acetaminophen  **OR** acetaminophen , [MAR Hold] guaiFENesin -dextromethorphan , Heparin  (Porcine) in NaCl, iodixanol , lidocaine  (PF), [MAR Hold] lip balm, ondansetron  (ZOFRAN ) IV, [MAR Hold] mouth rinse, oxyCODONE , [MAR Hold] senna, [MAR Hold] sodium chloride  flush, temazepam , traMADol , [MAR Hold] trimethobenzamide   Patient Profile  89 y.o. male with history of CAD w/ prior NSTEMI in 2023 s/p CABG, AS, ischemic CM with prior recovery in EF, CKD IIIa, hx bradycardia, bladder cancer s/p TURPT, chronic anemia.   Admitted with acute systolic heart failure/cardiogenic shock in setting of severe AS and acute on chronic anemia.   Assessment/Plan  Acute systolic CHF>cardiogenic shock -EF 50-55% in 06/25 w/ reported moderate AS -Echo this admit: EF 15-20% w/ severe AS on preliminary review - Limited echo 03/31/24 EF 20%, no LV thrombus, LV with GHK, RV mildly reduced, LA mildly dilated, mod MR, AV mean gradient 19 mmHg, severe aortic stenosis -S/P TAVR today; patient reports feeling great this morning.  -Mildly hypervolemic on exam; weaned off levophed . Stopped 50cc/hr IVF resuscitation. Discussed with bedside nursing. LVEDP 20 at start of case. Repeat limited TTE ordered.  - Will monitor inpatient for next 24h before considering discharge.    Severe AS - Noted on echo this admit - Structural heart team following - S/P TAVR by Dr. Wendel. Doing very well this AM. Repeat limited TTE pending.    CAD Elevated troponin -NSTEMI 2023 s/p CABG X 3 -HS troponin 484 -Suspect demand ischemia -Statin on hold d/t shock liver. No aspirin  with significant anemia.   Acute on chronic macrocytic anemia -Hbgb previously 9s-10s, 8.3 in April. 7.2 on admit s/p 1 u RBCs -FOBT X 1 negative.  - Hgb 7.2 this morning -? AVMs in setting of severe AS   AKI on CKD IIIa -sCr 1.5 today from 1.87.   A fib? - EKG NSR w/ PACs; no indication for anticoagulation. QRS widening on EKG today. Will repeat outpatient.  - EKG on admission with NSR - Hgb 8.3 / plts 75K.  - Not a great candidate for [facility]   GOC: Palliative Care consulted. Now DNR.   Length of Stay: 9  Shane Badeaux, DO  04/04/2024, 11:57 AM  Advanced Heart Failure Team Pager [phone] (M-F; 7a - 5p)  Please contact [facility] for night-coverage after hours (5p -7a ) and weekends on [email or web address]

## 2024-04-05 ENCOUNTER — Inpatient Hospital Stay (HOSPITAL_COMMUNITY)

## 2024-04-05 DIAGNOSIS — I5023 Acute on chronic systolic (congestive) heart failure: Secondary | ICD-10-CM | POA: Diagnosis not present

## 2024-04-05 DIAGNOSIS — Z952 Presence of prosthetic heart valve: Secondary | ICD-10-CM | POA: Diagnosis not present

## 2024-04-05 DIAGNOSIS — R570 Cardiogenic shock: Secondary | ICD-10-CM | POA: Diagnosis not present

## 2024-04-05 DIAGNOSIS — I5021 Acute systolic (congestive) heart failure: Secondary | ICD-10-CM

## 2024-04-05 LAB — BASIC METABOLIC PANEL WITH GFR
Anion gap: 12 (ref 5–15)
Anion gap: 8 (ref 5–15)
BUN: 40 mg/dL — ABNORMAL HIGH (ref 8–23)
BUN: 40 mg/dL — ABNORMAL HIGH (ref 8–23)
CO2: 22 mmol/L (ref 22–32)
CO2: 23 mmol/L (ref 22–32)
Calcium: 7.8 mg/dL — ABNORMAL LOW (ref 8.9–10.3)
Calcium: 7.7 mg/dL — ABNORMAL LOW (ref 8.9–10.3)
Chloride: 94 mmol/L — ABNORMAL LOW (ref 98–111)
Chloride: 98 mmol/L (ref 98–111)
Creatinine, Ser: 1.56 mg/dL — ABNORMAL HIGH (ref 0.61–1.24)
Creatinine, Ser: 1.63 mg/dL — ABNORMAL HIGH (ref 0.61–1.24)
GFR, Estimated: 43 mL/min — ABNORMAL LOW (ref >60)
GFR, Estimated: 41 mL/min — ABNORMAL LOW (ref >60)
Glucose, Bld: 101 mg/dL — ABNORMAL HIGH (ref 70–99)
Glucose, Bld: 94 mg/dL (ref 70–99)
Potassium: 4.5 mmol/L (ref 3.5–5.1)
Potassium: 4.4 mmol/L (ref 3.5–5.1)
Sodium: 128 mmol/L — ABNORMAL LOW (ref 135–145)
Sodium: 129 mmol/L — ABNORMAL LOW (ref 135–145)

## 2024-04-05 LAB — ECHOCARDIOGRAM COMPLETE
AR max vel: 1.49 cm2
AV Area VTI: 1.43 cm2
AV Area mean vel: 1.44 cm2
AV Mean grad: 10.5 mmHg
AV Peak grad: 20.3 mmHg
Ao pk vel: 2.26 m/s
Area-P 1/2: 4.6 cm2
Height: 64 in
S' Lateral: 3.8 cm
Weight: 1940.05 [oz_av]

## 2024-04-05 LAB — CBC
HCT: 25 % — ABNORMAL LOW (ref 39.0–52.0)
HCT: 23 % — ABNORMAL LOW (ref 39.0–52.0)
Hemoglobin: 8.4 g/dL — ABNORMAL LOW (ref 13.0–17.0)
Hemoglobin: 7.9 g/dL — ABNORMAL LOW (ref 13.0–17.0)
MCH: 33.7 pg (ref 26.0–34.0)
MCH: 34.5 pg — ABNORMAL HIGH (ref 26.0–34.0)
MCHC: 33.6 g/dL (ref 30.0–36.0)
MCHC: 34.3 g/dL (ref 30.0–36.0)
MCV: 100.4 fL — ABNORMAL HIGH (ref 80.0–100.0)
MCV: 100.4 fL — ABNORMAL HIGH (ref 80.0–100.0)
Platelets: 54 K/uL — ABNORMAL LOW (ref 150–400)
Platelets: 48 K/uL — ABNORMAL LOW (ref 150–400)
RBC: 2.49 MIL/uL — ABNORMAL LOW (ref 4.22–5.81)
RBC: 2.29 MIL/uL — ABNORMAL LOW (ref 4.22–5.81)
RDW: 20 % — ABNORMAL HIGH (ref 11.5–15.5)
RDW: 20.2 % — ABNORMAL HIGH (ref 11.5–15.5)
WBC: 7.4 K/uL (ref 4.0–10.5)
WBC: 6.5 K/uL (ref 4.0–10.5)
nRBC: 0.4 % — ABNORMAL HIGH (ref 0.0–0.2)
nRBC: 0 % (ref 0.0–0.2)

## 2024-04-05 LAB — COOXEMETRY PANEL
Carboxyhemoglobin: 2.1 % — ABNORMAL HIGH (ref 0.5–1.5)
Methemoglobin: <0.7 % (ref 0.0–1.5)
O2 Saturation: 66.1 %
Total hemoglobin: 8.1 g/dL — ABNORMAL LOW (ref 12.0–16.0)

## 2024-04-05 NOTE — Progress Notes (Signed)
*  PRELIMINARY RESULTS* Echocardiogram 2D Echocardiogram has been performed.  Danny [name] 04/05/2024, 10:46 AM

## 2024-04-05 NOTE — Progress Notes (Signed)
 Mobility Specialist Progress Note:    04/05/24 1232  Mobility  Activity Respositioned in chair (Ankle Pump, leg lits, leg abd, leg add x10 all resisted)  Level of Assistance Standby assist, set-up cues, supervision of patient - no hands on  Assistive Device None  Range of Motion/Exercises Left leg;Right leg  Mobility Referral Yes  Mobility visit 1 Mobility  Mobility Specialist Start Time (ACUTE ONLY) 1232  Mobility Specialist Stop Time (ACUTE ONLY) 1246  Mobility Specialist Time Calculation (min) (ACUTE ONLY) 14 min   Received pt recently put in recliner not wanting to ambulate but agreeable to session. No c/o any symptoms but seemed moderately tired. Pt able to do all exercises well once cued. Left pt in recliner w/ all needs met.  Venetia Keel Mobility Specialist Please Neurosurgeon or Rehab Office at [phone]

## 2024-04-05 NOTE — Progress Notes (Addendum)
 Progress Note  Patient Name: Danny [name] Date of Encounter: 04/05/2024  Primary Cardiologist: [name]  Subjective   Some SOB when moving about, no orthopnea. Feels pretty good today. No groin site issues. About to go walking with tech shortly.  Inpatient Medications    Scheduled Meds:  Chlorhexidine  Gluconate Cloth  6 each Topical Daily   Influenza vac split trivalent PF  0.5 mL Intramuscular Tomorrow-1000   melatonin  3 mg Oral QHS   rosuvastatin   10 mg Oral Daily   sodium chloride  flush  3 mL Intravenous Q12H   sodium chloride  flush  3 mL Intravenous Q12H   Continuous Infusions:  sodium chloride      cefTRIAXone  (ROCEPHIN )  IV Stopped (04/04/24 1600)   PRN Meds: sodium chloride , acetaminophen  **OR** acetaminophen , guaiFENesin -dextromethorphan , lip balm, morphine  injection, ondansetron  (ZOFRAN ) IV, mouth rinse, oxyCODONE , senna, sodium chloride  flush, sodium chloride  flush, traMADol , trimethobenzamide    Vital Signs    Vitals:   04/05/24 1100 04/05/24 1200 04/05/24 1201 04/05/24 1300  BP: 112/70 125/76 115/86   Pulse: 80  77 76  Resp: 19  16 17   Temp:   (!) 97.5 F (36.4 C)   TempSrc:   Oral   SpO2: 98%  94% 97%  Weight:      Height:        Intake/Output Summary (Last 24 hours) at 04/05/2024 1329 Last data filed at 04/05/2024 1300 Gross per 24 hour  Intake 1066.75 ml  Output 875 ml  Net 191.75 ml      04/05/2024    3:55 AM 04/04/2024    6:33 AM 04/04/2024    4:04 AM  Last 3 Weights  Weight (lbs) 121 lb 4.1 oz 118 lb 6.2 oz 118 lb 6.2 oz  Weight (kg) 55 kg 53.7 kg 53.7 kg     Telemetry    NSR, occ PACs/couplets, rare PVC  - Personally Reviewed  ECG    NSR 86bpm LBBB - Personally Reviewed  Physical Exam   GEN: No acute distress.  HEENT: Normocephalic, atraumatic, sclera non-icteric. Neck: No JVD or bruits. Cardiac: RRR no murmurs, rubs, or gallops.  Respiratory: Diminished BS bilaterally L>R. Breathing is unlabored. GI: Soft, nontender,  non-distended, BS +x 4. MS: no deformity. Extremities: No clubbing or cyanosis. No significant edema. Distal pedal pulses are 2+ and equal bilaterally. Bilateral groin sites c/d/I without hematoma, ecchymosis or bruit. Tegaderm with pressure dressing in tact. Neuro:  AAOx3. Follows commands. Mildly HOH Psych:  Responds to questions appropriately with a normal affect.  Labs    High Sensitivity Troponin:   Recent Labs  Lab 03/26/24 0404  TROPONINIHS 484*      Cardiac EnzymesNo results for input(s): TROPONINI in the last 168 hours. No results for input(s): TROPIPOC in the last 168 hours.   Chemistry Recent Labs  Lab 04/01/24 0650 04/02/24 0500 04/03/24 0434 04/04/24 0404 04/05/24 0150 04/05/24 0545  NA 130*   < > 130* 131* 128* 129*  K 5.0   < > 4.1 4.4 4.5 4.4  CL 98   < > 97* 99 94* 98  CO2 22   < > 26 25 22 23   GLUCOSE 102*   < > 90 100* 101* 94  BUN 58*   < > 60* 46* 40* 40*  CREATININE 1.68*   < > 1.87* 1.50* 1.56* 1.63*  CALCIUM  8.4*   < > 7.9* 8.2* 7.8* 7.7*  PROT 5.9*  --  5.2*  --   --   --  ALBUMIN  2.6*  --  2.3*  --   --   --   AST 38  --  26  --   --   --   ALT 86*  --  50*  --   --   --   ALKPHOS 83  --  71  --   --   --   BILITOT 1.1  --  1.1  --   --   --   GFRNONAA 39*   < > 34* 45* 43* 41*  ANIONGAP 10   < > 7 7 12 8    < > = values in this interval not displayed.     Hematology Recent Labs  Lab 04/04/24 0404 04/05/24 0150 04/05/24 0545  WBC 5.5 7.4 6.5  RBC 2.45* 2.49* 2.29*  HGB 8.3* 8.4* 7.9*  HCT 24.7* 25.0* 23.0*  MCV 100.8* 100.4* 100.4*  MCH 33.9 33.7 34.5*  MCHC 33.6 33.6 34.3  RDW 20.6* 20.0* 20.2*  PLT 75* 54* 48*    BNPNo results for input(s): BNP, PROBNP in the last 168 hours.   DDimer No results for input(s): DDIMER in the last 168 hours.   Radiology    ECHOCARDIOGRAM COMPLETE Result Date: 04/05/2024    ECHOCARDIOGRAM REPORT   Patient Name:   Danny [name] Date of Exam: 04/05/2024 Medical Rec #:  [id number]       Height:       64.0 in Accession #:    [id number]     Weight:       121.3 lb Date of Birth:  Oct 02, 1933     BSA:          1.582 m Patient Age:    89 years       BP:           122/70 mmHg Patient Gender: M              HR:           77 bpm. Exam Location:  Inpatient Procedure: 2D Echo, 3D Echo, Cardiac Doppler, Color Doppler and Strain Analysis            (Both Spectral and Color Flow Doppler were utilized during            procedure). Indications:    Post TAVR evaluation V43.3 / Z95.2  History:        Patient has prior history of Echocardiogram examinations, most                 recent 04/04/2024. CHF, CAD and Previous Myocardial Infarction,                 Prior CABG, Arrythmias:Bradycardia; Risk Factors:Hypertension                 and Dyslipidemia. Chronic kidney disease, stage 3a (HCC), S/P                 TAVR (transcatheter aortic valve replacement).                 Aortic Valve: 23 mm Sapien prosthetic, stented (TAVR) valve is                 present in the aortic position. Procedure Date: 04/04/24.  Sonographer:    Aida Pizza RCS Referring Phys: [id number] [facility] R THOMPSON  Sonographer Comments: Global longitudinal strain was attempted. IMPRESSIONS  1. S/P TAVR with mean gradient 10 mmHg and trace AI.  2. Left ventricular ejection fraction,  by estimation, is 35 to 40%. The left ventricle has moderately decreased function. The left ventricle demonstrates global hypokinesis. Left ventricular diastolic parameters are consistent with Grade II diastolic dysfunction (pseudonormalization). The average left ventricular global longitudinal strain is -8.7 %. The global longitudinal strain is abnormal.  3. Right ventricular systolic function is normal. The right ventricular size is normal.  4. Left atrial size was severely dilated.  5. Right atrial size was severely dilated.  6. Moderate pleural effusion in the left lateral region.  7. The mitral valve is normal in structure. Mild mitral valve regurgitation. No  evidence of mitral stenosis.  8. The aortic valve has been repaired/replaced. Aortic valve regurgitation is trivial. No aortic stenosis is present. There is a 23 mm Sapien prosthetic (TAVR) valve present in the aortic position. Procedure Date: 04/04/24.  9. The inferior vena cava is normal in size with greater than 50% respiratory variability, suggesting right atrial pressure of 3 mmHg. FINDINGS  Left Ventricle: Left ventricular ejection fraction, by estimation, is 35 to 40%. The left ventricle has moderately decreased function. The left ventricle demonstrates global hypokinesis. The average left ventricular global longitudinal strain is -8.7 %.  Strain was performed and the global longitudinal strain is abnormal. The left ventricular internal cavity size was normal in size. There is no left ventricular hypertrophy. Left ventricular diastolic parameters are consistent with Grade II diastolic dysfunction (pseudonormalization). Right Ventricle: The right ventricular size is normal. Right ventricular systolic function is normal. Left Atrium: Left atrial size was severely dilated. Right Atrium: Right atrial size was severely dilated. Pericardium: There is no evidence of pericardial effusion. Mitral Valve: The mitral valve is normal in structure. Mild mitral annular calcification. Mild mitral valve regurgitation. No evidence of mitral valve stenosis. Tricuspid Valve: The tricuspid valve is normal in structure. Tricuspid valve regurgitation is mild . No evidence of tricuspid stenosis. Aortic Valve: The aortic valve has been repaired/replaced. Aortic valve regurgitation is trivial. No aortic stenosis is present. Aortic valve mean gradient measures 10.5 mmHg. Aortic valve peak gradient measures 20.3 mmHg. Aortic valve area, by VTI measures 1.43 cm. There is a 23 mm Sapien prosthetic, stented (TAVR) valve present in the aortic position. Procedure Date: 04/04/24. Pulmonic Valve: The pulmonic valve was normal in structure.  Pulmonic valve regurgitation is not visualized. No evidence of pulmonic stenosis. Aorta: The aortic root is normal in size and structure. Venous: The inferior vena cava is normal in size with greater than 50% respiratory variability, suggesting right atrial pressure of 3 mmHg. IAS/Shunts: No atrial level shunt detected by color flow Doppler. Additional Comments: S/P TAVR with mean gradient 10 mmHg and trace AI. 3D was performed not requiring image post processing on an independent workstation and was abnormal. There is a moderate pleural effusion in the left lateral region.  LEFT VENTRICLE PLAX 2D LVIDd:         5.00 cm   Diastology LVIDs:         3.80 cm   LV e' medial:    4.79 cm/s LV PW:         0.90 cm   LV E/e' medial:  22.3 LV IVS:        1.00 cm   LV e' lateral:   10.00 cm/s LVOT diam:     2.00 cm   LV E/e' lateral: 10.7 LV SV:         62 LV SV Index:   39        2D Longitudinal Strain  LVOT Area:     3.14 cm  2D Strain GLS Avg:     -8.7 %                           3D Volume EF:                          3D EF:        44 %                          LV EDV:       173 ml                          LV ESV:       97 ml                          LV SV:        76 ml RIGHT VENTRICLE RV S prime:     13.50 cm/s TAPSE (M-mode): 1.8 cm LEFT ATRIUM             Index        RIGHT ATRIUM           Index LA diam:        4.10 cm 2.59 cm/m   RA Area:     23.80 cm LA Vol (A2C):   69.0 ml 43.63 ml/m  RA Volume:   81.30 ml  51.41 ml/m LA Vol (A4C):   76.1 ml 48.12 ml/m LA Biplane Vol: 75.6 ml 47.80 ml/m  AORTIC VALVE AV Area (Vmax):    1.49 cm AV Area (Vmean):   1.44 cm AV Area (VTI):     1.43 cm AV Vmax:           225.50 cm/s AV Vmean:          149.000 cm/s AV VTI:            0.438 m AV Peak Grad:      20.3 mmHg AV Mean Grad:      10.5 mmHg LVOT Vmax:         107.00 cm/s LVOT Vmean:        68.350 cm/s LVOT VTI:          0.198 m LVOT/AV VTI ratio: 0.45  AORTA Ao Root diam: 3.30 cm MITRAL VALVE MV Area (PHT): 4.60 cm      SHUNTS MV Decel Time: 165 msec     Systemic VTI:  0.20 m MV E velocity: 107.00 cm/s  Systemic Diam: 2.00 cm MV A velocity: 86.40 cm/s MV E/A ratio:  1.24 Redell Shallow MD Electronically signed by Redell Shallow MD Signature Date/Time: 04/05/2024/1:03:26 PM    Final    Structural Heart Procedure Result Date: 04/04/2024 See surgical note for result.   Cardiac Studies   2d echo 04/05/24   1. S/P TAVR with mean gradient 10 mmHg and trace AI.   2. Left ventricular ejection fraction, by estimation, is 35 to 40%. The  left ventricle has moderately decreased function. The left ventricle  demonstrates global hypokinesis. Left ventricular diastolic parameters are  consistent with Grade II diastolic  dysfunction (pseudonormalization). The average left ventricular global  longitudinal strain is -8.7 %. The global longitudinal strain is abnormal.   3. Right ventricular systolic function  is normal. The right ventricular  size is normal.   4. Left atrial size was severely dilated.   5. Right atrial size was severely dilated.   6. Moderate pleural effusion in the left lateral region.   7. The mitral valve is normal in structure. Mild mitral valve  regurgitation. No evidence of mitral stenosis.   8. The aortic valve has been repaired/replaced. Aortic valve  regurgitation is trivial. No aortic stenosis is present. There is a 23 mm  Sapien prosthetic (TAVR) valve present in the aortic position. Procedure  Date: 04/04/24.   9. The inferior vena cava is normal in size with greater than 50%  respiratory variability, suggesting right atrial pressure of 3 mmHg.   Patient Profile     89 y.o. male with CAD w/ prior NSTEMI 07/2021 s/p CABG (RIMA to LAD, LIMA to OM1, SVG to OM2), ischemic cardiomyopathy, hx CVA, PAD, bladder cancer, CKD IIIa, LBBB and aortic valve stenosis. He was admitted for evaluation and management of a/c CHF, acute on chronic anemia, AKI on CKD IIIb, elevated LFTs, pleural effusions requiring  thoracentesis. 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 Structural/AHF team have assisted with care. EF found to be <20%, down from prior 50-55% in 12/2023. Underwent transfemoral TAVR 04/04/24.  Assessment & Plan    1. Acute systolic CHF with cardiogenic shock, pleural effusions requiring thoracentesis - 12/2023 EF 50-55% w/ reported moderate AS - 03/26/24 on admission EF <20% with severe AS - 04/05/24 POD1 TAVR EF 35-40% - Etiology for drop in EF not certain. Could be d/t AS. Also has LBBB w/ wide QRS.  - S/p -1.3L on the L and -1.4L on the R Thoracentesis 04/01/24 - treated with milrinone , remains off - CO-OX remains stable.  - GDMT limited with shock/AKI, hyperkalemia - CVPs in EMR this AM 6, 6, 8, 6, 0 - will review recs with MD -> moderate pleural effusion on echo, c/w exam as well  2. Severe AS - s/p TAVR 04/04/24 - POD1 echo with EF 35-40%, G2DD, normal RV, severe BAE. Mild MR - no antiplatelets currently in setting of anemia/thrombocytopenia  3. CAD - Elevated troponin - NSTEMI 2023 s/p CABG X 3 - per notes cardiac CT demonstrated patent RIMA-LAD and LIMA-OM1 as well as an occluded SVG-->OM which was not felt to explain his cardiomyopathy, angiography deferred - HS troponin 484, suspected demand ischemia - Statin initially held due to shock liver, back on rosuvastatin  10mg  daily since 03/28/24 - No aspirin  with significant anemia   4. Acute on chronic macrocytic anemia, declining thrombocytopenia -Hbgb previously 9s-10s, 8.3 in April. 7.2 on admit s/p 1 u RBCs - Hgb 7.9 this AM, pending review of goal Hgb/plt with MD -> note platelet count also on decline -? AVMs in setting of severe AS  - IM also raising question of possible myeloproliferative disorder given prior trends   5. AKI on CKD IIIa - Baseline Scr 1.2-1.3, peaked 2.7 in setting of shock, 1.63 today, relatively stable but follow  6. Elevated  LFTs - Suspect shock liver, LFTs have trended down - US  abdomen with biliary sludge and gallbladder wall thickening - Follow LFTs in AM  7. Question of Afib this admission - EKGs have shown NSR/SB with LBBB, will need to follow conduction as OP - currently NSR with occasional PACs, rare PVCs - TSH OK - Not a great candidate for [facility] given #4  8. Hyponatremia - remains mildly decreased but  generally stable  Remainder per primary  For questions or updates, please contact [facility] Please consult [email or web address] for contact info under Cardiology/STEMI.  Signed, Dayna N Dunn, PA-C 04/05/2024, 1:29 PM    Patient seen with PA, I formulated the plan and agree with the above note.   Creatinine stable at 1.63, hgb lower at 7.9 and plts down to 48.  Co-ox 66%.  Patient feels better post-TAVR, walked in hall today.   Echo with EF 35-40%, normal RV, stable bioprosthetic AoV post-TAVR.   General: NAD Neck: No JVD, no thyromegaly or thyroid  nodule.  Lungs: Decreased BS at bases.  CV: Nondisplaced PMI.  Heart regular S1/S2, no S3/S4, 1/6 SEM RUSB.  No peripheral edema.  No carotid bruit.  Abdomen: Soft, nontender, no hepatosplenomegaly, no distention.  Skin: Intact without lesions or rashes.  Neurologic: Alert and oriented x 3.  Psych: Normal affect. Extremities: No clubbing or cyanosis.  HEENT: Normal.   Symptomatically better post-TAVR, echo reviewed with EF up to 35-40% and bioprosthetic aortic valve functioning normally.  He does not look volume overloaded on exam. GDMT limited by AKI, think we can start Farxiga 10 mg daily today with creatinine stable at 1.63.   Would hold off on thoracentesis for now.    Hgb down to 7.9, would transfuse < 7.  Platelets lower at 48K.  He is not on ASA or Plavix  with anemia/thrombocytopenia.   Would like to see platelets and hgb stable prior to discharging home.  Continue to mobilize.   Ezra Shuck 04/05/2024

## 2024-04-05 NOTE — Plan of Care (Signed)

## 2024-04-05 NOTE — Progress Notes (Signed)
 PROGRESS NOTE    Danny [name]  FMW:[id number] DOB: [birth year] DOA: 03/25/2024 PCP: [name] LABOR, MD  89/M w systolic CHF, aortic stenosis, CAD, CKD, and bladder cancer sp TURP who presented with generalized weakness and fatigue.  In ED, vol overloaded, cr 2.2 , troponin 375 and 360 , Lactic acid 2.2 , hgb 7.2  Echo with reduced EF,  with signs of hypoperfusion.  Placed on milrinone  for inotropic support.  -9/19 off milrinone . Consulted structural heart team.  -9/22 limited echocardiogram with sever low flow gradient aortic stenosis.  - 9/23 right and left thoracentesis, 1.3 L drained on left and 1.4 on right - 9/24, structural heart team consult, plan for TAVR on 9/30, suggested to keep as inpatient -9/26: underwent TAVR  Subjective: Feels okay, no events overnight  Assessment and Plan:  Acute on chronic systolic CHF Severe MR, TR Severe LFLG Aortic stenosis -Echo w EF < 20%, RV with mod reduced, severe mitral valve regurgitation, moderate to severe tricuspid valve regurgitation, low flow severe aortic valve stenosis with moderate regurgitation, moderate pulmonary valve regurgitation,  - concern for low output on admission, started on milrinone  with diuretics -Weaned off milrinone  on 9/19  - GDMT limited by hypotension - CVP is low, diuretics on hold - Structural heart team following, underwent TAVR yesterday, repeat echo completed this morning - Palliative care following, DNR - Increase activity, PT  Abnormal LFTs, shock liver Improving  AKI on CKD 3a - Baseline creatinine 1.3, peaked at 2.7 in the setting of cardiogenic shock - Improving, diuretics on hold now  Coronary artery disease History of CABG 2023, at bedtime troponin was 484, felt to be demand ischemia from CHF - Statin held for shock liver, aspirin  held with severe anemia  PAD (peripheral artery disease) Continue supportive medical care  Macrocytic anemia Thrombocytopenia.  - Could have  underlying myeloproliferative disorder, mild anemia and thrombocytopenia have been ongoing issues for at least 2 years, worse now - B12 -normal - Indirect bilirubin is normal, hemolysis less likely - Hemoglobin trending down further, transfused 1 unit PRBC  - Platelets lower, received Heparin  for TAVR 9/26, monitor  Abnormal Bladder on CT - CT notes, Intraluminal gas within the urinary bladder including scattered punctate foci along the posterior bladder wall. 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 -pt denies any new or concerning symptoms, suspect related to bladder CA -FU Urine CX, given TAVR yesterday I started empiric antibiotics, day 2/3 today  History of bladder cancer Follow up as outpatient.   DVT prophylaxis: SCDs Code Status: DNR Family Communication: none present Disposition Plan: Home with home health services  Consultants:    Procedures:   Antimicrobials:    Objective: Vitals:   04/05/24 0700 04/05/24 0800 04/05/24 0900 04/05/24 1000  BP: 120/65 122/70 (!) 126/101 (!) 110/58  Pulse: 70 77 80 72  Resp: 15 15 17  (!) 9  Temp:  98.1 F (36.7 C)    TempSrc:  Oral    SpO2: 97% 98% 100% 97%  Weight:      Height:        Intake/Output Summary (Last 24 hours) at 04/05/2024 1110 Last data filed at 04/05/2024 1000 Gross per 24 hour  Intake 826.75 ml  Output 875 ml  Net -48.25 ml   Filed Weights   04/04/24 0404 04/04/24 0633 04/05/24 0355  Weight: 53.7 kg 53.7 kg 55 kg    Examination:  Gen: Awake, Alert, Oriented  X 3, chronically ill-appearing, no distress HEENT: no JVD Lungs: Good air movement bilaterally, CTAB CVS: S1S2/RRR systolic murmur Abd: soft, Non tender, non distended, BS present Extremities: No edema, right arm PICC line Skin: no new rashes on exposed skin     Data Reviewed:   CBC: Recent Labs  Lab 03/31/24 0602 04/03/24 0434  04/04/24 0404 04/05/24 0150 04/05/24 0545  WBC 6.6 5.7 5.5 7.4 6.5  HGB 8.1* 7.2* 8.3* 8.4* 7.9*  HCT 24.3* 21.1* 24.7* 25.0* 23.0*  MCV 105.7* 103.4* 100.8* 100.4* 100.4*  PLT 80* 74* 75* 54* 48*   Basic Metabolic Panel: Recent Labs  Lab 03/30/24 0500 03/31/24 0602 04/01/24 0650 04/02/24 0500 04/03/24 0434 04/04/24 0404 04/05/24 0150 04/05/24 0545  NA 131* 131*   < > 131* 130* 131* 128* 129*  K 4.1 4.7   < > 4.2 4.1 4.4 4.5 4.4  CL 101 101   < > 99 97* 99 94* 98  CO2 24 23   < > 24 26 25 22 23   GLUCOSE 101* 108*   < > 125* 90 100* 101* 94  BUN 56* 55*   < > 60* 60* 46* 40* 40*  CREATININE 1.76* 1.75*   < > 1.92* 1.87* 1.50* 1.56* 1.63*  CALCIUM  8.1* 8.2*   < > 8.2* 7.9* 8.2* 7.8* 7.7*  MG 1.9 1.9  --  1.8  --   --   --   --    < > = values in this interval not displayed.   GFR: Estimated Creatinine Clearance: 24.8 mL/min (A) (by C-G formula based on SCr of 1.63 mg/dL (H)). Liver Function Tests: Recent Labs  Lab 04/01/24 0650 04/03/24 0434  AST 38 26  ALT 86* 50*  ALKPHOS 83 71  BILITOT 1.1 1.1  PROT 5.9* 5.2*  ALBUMIN  2.6* 2.3*   No results for input(s): LIPASE, AMYLASE in the last 168 hours.  No results for input(s): AMMONIA in the last 168 hours. Coagulation Profile: Recent Labs  Lab 04/03/24 2105  INR 1.2   Cardiac Enzymes: No results for input(s): CKTOTAL, CKMB, CKMBINDEX, TROPONINI in the last 168 hours. BNP (last 3 results) No results for input(s): PROBNP in the last 8760 hours. HbA1C: Recent Labs    04/03/24 2105  HGBA1C 5.1   CBG: No results for input(s): GLUCAP in the last 168 hours.  Lipid Profile: No results for input(s): CHOL, HDL, LDLCALC, TRIG, CHOLHDL, LDLDIRECT in the last 72 hours. Thyroid  Function Tests: No results for input(s): TSH, T4TOTAL, FREET4, T3FREE, THYROIDAB in the last 72 hours. Anemia Panel: No results for input(s): VITAMINB12, FOLATE, FERRITIN, TIBC, IRON,  RETICCTPCT in the last 72 hours.  Urine analysis:    Component Value Date/Time   COLORURINE YELLOW 04/04/2024 0340   APPEARANCEUR CLOUDY (A) 04/04/2024 0340   LABSPEC 1.017 04/04/2024 0340   PHURINE 5.0 04/04/2024 0340   GLUCOSEU NEGATIVE 04/04/2024 0340   HGBUR SMALL (A) 04/04/2024 0340   BILIRUBINUR NEGATIVE 04/04/2024 0340   BILIRUBINUR large (A) 10/20/2022 1608   BILIRUBINUR negative 01/19/2022 0924   KETONESUR NEGATIVE 04/04/2024 0340   PROTEINUR 30 (A) 04/04/2024 0340   UROBILINOGEN 4.0 (A) 10/20/2022 1608   UROBILINOGEN 0.2 11/28/2013 2032   NITRITE NEGATIVE 04/04/2024 0340   LEUKOCYTESUR LARGE (A) 04/04/2024 0340   Sepsis Labs: @LABRCNTIP (procalcitonin:4,lacticidven:4)  ) Recent Results (from the past 240 hours)  Surgical pcr screen     Status: None   Collection Time: 04/03/24  7:51 PM   Specimen: Nasal Mucosa; Nasal  Swab  Result Value Ref Range Status   MRSA, PCR NEGATIVE NEGATIVE Final   Staphylococcus aureus NEGATIVE NEGATIVE Final    Comment: (NOTE) The Xpert SA Assay (FDA approved for NASAL specimens in patients 30 years of age and older), is one component of a comprehensive surveillance program. It is not intended to diagnose infection nor to guide or monitor treatment. Performed at [facility], [address], [place], [place] [address]      Radiology Studies: Structural Heart Procedure Result Date: 04/04/2024 See surgical note for result.    Scheduled Meds:  Chlorhexidine  Gluconate Cloth  6 each Topical Daily   Influenza vac split trivalent PF  0.5 mL Intramuscular Tomorrow-1000   melatonin  3 mg Oral QHS   rosuvastatin   10 mg Oral Daily   sodium chloride  flush  3 mL Intravenous Q12H   sodium chloride  flush  3 mL Intravenous Q12H   Continuous Infusions:  sodium chloride      cefTRIAXone  (ROCEPHIN )  IV Stopped (04/04/24 1600)   norepinephrine  (LEVOPHED ) Adult infusion Stopped (04/04/24 1015)     LOS: 10 days    Time spent:     Sigurd Pac, MD [facility]   04/05/2024, 11:10 AM

## 2024-04-05 NOTE — Progress Notes (Signed)
 CARDIAC REHAB PHASE I    Post TAVR education including site care, restrictions, risk factors, heart healthy diet, exercise guidelines and CRP2 reviewed. All questions and concerns addressed. Pt is not interested in CRP2 at this time.   [id number] Danny Asberry Hacking, RN BSN 04/05/2024 10:30 AM

## 2024-04-06 ENCOUNTER — Encounter (HOSPITAL_COMMUNITY): Payer: Self-pay | Admitting: Internal Medicine

## 2024-04-06 DIAGNOSIS — I5021 Acute systolic (congestive) heart failure: Secondary | ICD-10-CM | POA: Diagnosis not present

## 2024-04-06 DIAGNOSIS — R570 Cardiogenic shock: Secondary | ICD-10-CM | POA: Diagnosis not present

## 2024-04-06 DIAGNOSIS — I5023 Acute on chronic systolic (congestive) heart failure: Secondary | ICD-10-CM | POA: Diagnosis not present

## 2024-04-06 DIAGNOSIS — Z952 Presence of prosthetic heart valve: Secondary | ICD-10-CM | POA: Diagnosis not present

## 2024-04-06 LAB — CBC
HCT: 22.3 % — ABNORMAL LOW (ref 39.0–52.0)
Hemoglobin: 7.7 g/dL — ABNORMAL LOW (ref 13.0–17.0)
MCH: 34.5 pg — ABNORMAL HIGH (ref 26.0–34.0)
MCHC: 34.5 g/dL (ref 30.0–36.0)
MCV: 100 fL (ref 80.0–100.0)
Platelets: 37 K/uL — ABNORMAL LOW (ref 150–400)
RBC: 2.23 MIL/uL — ABNORMAL LOW (ref 4.22–5.81)
RDW: 19.5 % — ABNORMAL HIGH (ref 11.5–15.5)
WBC: 6.5 K/uL (ref 4.0–10.5)
nRBC: 0 % (ref 0.0–0.2)

## 2024-04-06 LAB — BASIC METABOLIC PANEL WITH GFR
Anion gap: 8 (ref 5–15)
BUN: 37 mg/dL — ABNORMAL HIGH (ref 8–23)
CO2: 22 mmol/L (ref 22–32)
Calcium: 7.8 mg/dL — ABNORMAL LOW (ref 8.9–10.3)
Chloride: 100 mmol/L (ref 98–111)
Creatinine, Ser: 1.32 mg/dL — ABNORMAL HIGH (ref 0.61–1.24)
GFR, Estimated: 52 mL/min — ABNORMAL LOW (ref >60)
Glucose, Bld: 100 mg/dL — ABNORMAL HIGH (ref 70–99)
Potassium: 4.4 mmol/L (ref 3.5–5.1)
Sodium: 130 mmol/L — ABNORMAL LOW (ref 135–145)

## 2024-04-06 LAB — COOXEMETRY PANEL
Carboxyhemoglobin: 2.3 % — ABNORMAL HIGH (ref 0.5–1.5)
Methemoglobin: <0.7 % (ref 0.0–1.5)
O2 Saturation: 85 %
Total hemoglobin: 8.1 g/dL — ABNORMAL LOW (ref 12.0–16.0)

## 2024-04-06 LAB — HEPATIC FUNCTION PANEL
ALT: 9 U/L (ref 0–44)
AST: 19 U/L (ref 15–41)
Albumin: 2.2 g/dL — ABNORMAL LOW (ref 3.5–5.0)
Alkaline Phosphatase: 60 U/L (ref 38–126)
Bilirubin, Direct: 0.2 mg/dL (ref 0.0–0.2)
Indirect Bilirubin: 0.5 mg/dL (ref 0.3–0.9)
Total Bilirubin: 0.7 mg/dL (ref 0.0–1.2)
Total Protein: 5 g/dL — ABNORMAL LOW (ref 6.5–8.1)

## 2024-04-06 MED ORDER — DAPAGLIFLOZIN PROPANEDIOL 10 MG PO TABS
10.0000 mg | ORAL_TABLET | Freq: Every day | ORAL | Status: DC
  Administered 2024-04-06: 10 mg via ORAL
  Filled 2024-04-06: qty 1

## 2024-04-06 MED ORDER — SPIRONOLACTONE 12.5 MG HALF TABLET
12.5000 mg | ORAL_TABLET | Freq: Every day | ORAL | Status: DC
  Administered 2024-04-06: 12.5 mg via ORAL
  Filled 2024-04-06: qty 1

## 2024-04-06 NOTE — Progress Notes (Signed)
 Patient ID: Danny [name], male   DOB: [birth year], 89 y.o.   MRN: [id number]  Progress Note  Patient Name: Danny [name] Date of Encounter: 04/06/2024  Primary Cardiologist: [name]  Subjective   Breathing is much better since TAVR.  CVP 5 today. Creatinine lower at 1.32.   Hgb 7.9 => 7.7, plts 48 => 37  On ceftriaxone  for possible UTI.   Inpatient Medications    Scheduled Meds:  Chlorhexidine  Gluconate Cloth  6 each Topical Daily   dapagliflozin propanediol  10 mg Oral Daily   Influenza vac split trivalent PF  0.5 mL Intramuscular Tomorrow-1000   melatonin  3 mg Oral QHS   rosuvastatin   10 mg Oral Daily   sodium chloride  flush  3 mL Intravenous Q12H   sodium chloride  flush  3 mL Intravenous Q12H   spironolactone  12.5 mg Oral Daily   Continuous Infusions:  sodium chloride      cefTRIAXone  (ROCEPHIN )  IV Stopped (04/05/24 1547)   PRN Meds: sodium chloride , acetaminophen  **OR** acetaminophen , guaiFENesin -dextromethorphan , lip balm, morphine  injection, ondansetron  (ZOFRAN ) IV, mouth rinse, oxyCODONE , senna, sodium chloride  flush, sodium chloride  flush, traMADol , trimethobenzamide    Vital Signs    Vitals:   04/06/24 0324 04/06/24 0325 04/06/24 0400 04/06/24 0810  BP: 116/61  113/62 (!) 122/58  Pulse: 60  65 71  Resp: 18  10 13   Temp: 97.7 F (36.5 C)   97.7 F (36.5 C)  TempSrc: Oral   Oral  SpO2: 98%  97% 98%  Weight:  55 kg    Height:        Intake/Output Summary (Last 24 hours) at 04/06/2024 1153 Last data filed at 04/06/2024 9187 Gross per 24 hour  Intake 1180 ml  Output 900 ml  Net 280 ml      04/06/2024    3:25 AM 04/05/2024    3:55 AM 04/04/2024    6:33 AM  Last 3 Weights  Weight (lbs) 121 lb 4.1 oz 121 lb 4.1 oz 118 lb 6.2 oz  Weight (kg) 55 kg 55 kg 53.7 kg     Telemetry    NSR with PACs, personally reviewed  Physical Exam   General: NAD Neck: No JVD, no thyromegaly or thyroid  nodule.  Lungs: Clear to auscultation bilaterally with normal  respiratory effort. CV: Nondisplaced PMI.  Heart regular S1/S2, no S3/S4, 1/6 SEM RUSB.  No peripheral edema.   Abdomen: Soft, nontender, no hepatosplenomegaly, no distention.  Skin: Intact without lesions or rashes.  Neurologic: Alert and oriented x 3.  Psych: Normal affect. Extremities: No clubbing or cyanosis.  HEENT: Normal.   Labs    High Sensitivity Troponin:   Recent Labs  Lab 03/26/24 0404  TROPONINIHS 484*      Cardiac EnzymesNo results for input(s): TROPONINI in the last 168 hours. No results for input(s): TROPIPOC in the last 168 hours.   Chemistry Recent Labs  Lab 04/01/24 0650 04/02/24 0500 04/03/24 0434 04/04/24 0404 04/05/24 0150 04/05/24 0545 04/06/24 0251  NA 130*   < > 130*   < > 128* 129* 130*  K 5.0   < > 4.1   < > 4.5 4.4 4.4  CL 98   < > 97*   < > 94* 98 100  CO2 22   < > 26   < > 22 23 22   GLUCOSE 102*   < > 90   < > 101* 94 100*  BUN 58*   < > 60*   < >  40* 40* 37*  CREATININE 1.68*   < > 1.87*   < > 1.56* 1.63* 1.32*  CALCIUM  8.4*   < > 7.9*   < > 7.8* 7.7* 7.8*  PROT 5.9*  --  5.2*  --   --   --  5.0*  ALBUMIN  2.6*  --  2.3*  --   --   --  2.2*  AST 38  --  26  --   --   --  19  ALT 86*  --  50*  --   --   --  9  ALKPHOS 83  --  71  --   --   --  60  BILITOT 1.1  --  1.1  --   --   --  0.7  GFRNONAA 39*   < > 34*   < > 43* 41* 52*  ANIONGAP 10   < > 7   < > 12 8 8    < > = values in this interval not displayed.     Hematology Recent Labs  Lab 04/05/24 0150 04/05/24 0545 04/06/24 0251  WBC 7.4 6.5 6.5  RBC 2.49* 2.29* 2.23*  HGB 8.4* 7.9* 7.7*  HCT 25.0* 23.0* 22.3*  MCV 100.4* 100.4* 100.0  MCH 33.7 34.5* 34.5*  MCHC 33.6 34.3 34.5  RDW 20.0* 20.2* 19.5*  PLT 54* 48* 37*    BNPNo results for input(s): BNP, PROBNP in the last 168 hours.   DDimer No results for input(s): DDIMER in the last 168 hours.   Radiology    ECHOCARDIOGRAM COMPLETE Result Date: 04/05/2024    ECHOCARDIOGRAM REPORT   Patient Name:   Danny [name] Date of Exam: 04/05/2024 Medical Rec #:  [id number]      Height:       64.0 in Accession #:    [id number]     Weight:       121.3 lb Date of Birth:  1934/02/08     BSA:          1.582 m Patient Age:    89 years       BP:           122/70 mmHg Patient Gender: M              HR:           77 bpm. Exam Location:  Inpatient Procedure: 2D Echo, 3D Echo, Cardiac Doppler, Color Doppler and Strain Analysis            (Both Spectral and Color Flow Doppler were utilized during            procedure). Indications:    Post TAVR evaluation V43.3 / Z95.2  History:        Patient has prior history of Echocardiogram examinations, most                 recent 04/04/2024. CHF, CAD and Previous Myocardial Infarction,                 Prior CABG, Arrythmias:Bradycardia; Risk Factors:Hypertension                 and Dyslipidemia. Chronic kidney disease, stage 3a (HCC), S/P                 TAVR (transcatheter aortic valve replacement).                 Aortic Valve: 23 mm Sapien prosthetic, stented (TAVR) valve is  present in the aortic position. Procedure Date: 04/04/24.  Sonographer:    Aida Pizza RCS Referring Phys: [id number] [facility] R THOMPSON  Sonographer Comments: Global longitudinal strain was attempted. IMPRESSIONS  1. S/P TAVR with mean gradient 10 mmHg and trace AI.  2. Left ventricular ejection fraction, by estimation, is 35 to 40%. The left ventricle has moderately decreased function. The left ventricle demonstrates global hypokinesis. Left ventricular diastolic parameters are consistent with Grade II diastolic dysfunction (pseudonormalization). The average left ventricular global longitudinal strain is -8.7 %. The global longitudinal strain is abnormal.  3. Right ventricular systolic function is normal. The right ventricular size is normal.  4. Left atrial size was severely dilated.  5. Right atrial size was severely dilated.  6. Moderate pleural effusion in the left lateral region.  7. The mitral valve is  normal in structure. Mild mitral valve regurgitation. No evidence of mitral stenosis.  8. The aortic valve has been repaired/replaced. Aortic valve regurgitation is trivial. No aortic stenosis is present. There is a 23 mm Sapien prosthetic (TAVR) valve present in the aortic position. Procedure Date: 04/04/24.  9. The inferior vena cava is normal in size with greater than 50% respiratory variability, suggesting right atrial pressure of 3 mmHg. FINDINGS  Left Ventricle: Left ventricular ejection fraction, by estimation, is 35 to 40%. The left ventricle has moderately decreased function. The left ventricle demonstrates global hypokinesis. The average left ventricular global longitudinal strain is -8.7 %.  Strain was performed and the global longitudinal strain is abnormal. The left ventricular internal cavity size was normal in size. There is no left ventricular hypertrophy. Left ventricular diastolic parameters are consistent with Grade II diastolic dysfunction (pseudonormalization). Right Ventricle: The right ventricular size is normal. Right ventricular systolic function is normal. Left Atrium: Left atrial size was severely dilated. Right Atrium: Right atrial size was severely dilated. Pericardium: There is no evidence of pericardial effusion. Mitral Valve: The mitral valve is normal in structure. Mild mitral annular calcification. Mild mitral valve regurgitation. No evidence of mitral valve stenosis. Tricuspid Valve: The tricuspid valve is normal in structure. Tricuspid valve regurgitation is mild . No evidence of tricuspid stenosis. Aortic Valve: The aortic valve has been repaired/replaced. Aortic valve regurgitation is trivial. No aortic stenosis is present. Aortic valve mean gradient measures 10.5 mmHg. Aortic valve peak gradient measures 20.3 mmHg. Aortic valve area, by VTI measures 1.43 cm. There is a 23 mm Sapien prosthetic, stented (TAVR) valve present in the aortic position. Procedure Date: 04/04/24.  Pulmonic Valve: The pulmonic valve was normal in structure. Pulmonic valve regurgitation is not visualized. No evidence of pulmonic stenosis. Aorta: The aortic root is normal in size and structure. Venous: The inferior vena cava is normal in size with greater than 50% respiratory variability, suggesting right atrial pressure of 3 mmHg. IAS/Shunts: No atrial level shunt detected by color flow Doppler. Additional Comments: S/P TAVR with mean gradient 10 mmHg and trace AI. 3D was performed not requiring image post processing on an independent workstation and was abnormal. There is a moderate pleural effusion in the left lateral region.  LEFT VENTRICLE PLAX 2D LVIDd:         5.00 cm   Diastology LVIDs:         3.80 cm   LV e' medial:    4.79 cm/s LV PW:         0.90 cm   LV E/e' medial:  22.3 LV IVS:        1.00 cm  LV e' lateral:   10.00 cm/s LVOT diam:     2.00 cm   LV E/e' lateral: 10.7 LV SV:         62 LV SV Index:   39        2D Longitudinal Strain LVOT Area:     3.14 cm  2D Strain GLS Avg:     -8.7 %                           3D Volume EF:                          3D EF:        44 %                          LV EDV:       173 ml                          LV ESV:       97 ml                          LV SV:        76 ml RIGHT VENTRICLE RV S prime:     13.50 cm/s TAPSE (M-mode): 1.8 cm LEFT ATRIUM             Index        RIGHT ATRIUM           Index LA diam:        4.10 cm 2.59 cm/m   RA Area:     23.80 cm LA Vol (A2C):   69.0 ml 43.63 ml/m  RA Volume:   81.30 ml  51.41 ml/m LA Vol (A4C):   76.1 ml 48.12 ml/m LA Biplane Vol: 75.6 ml 47.80 ml/m  AORTIC VALVE AV Area (Vmax):    1.49 cm AV Area (Vmean):   1.44 cm AV Area (VTI):     1.43 cm AV Vmax:           225.50 cm/s AV Vmean:          149.000 cm/s AV VTI:            0.438 m AV Peak Grad:      20.3 mmHg AV Mean Grad:      10.5 mmHg LVOT Vmax:         107.00 cm/s LVOT Vmean:        68.350 cm/s LVOT VTI:          0.198 m LVOT/AV VTI ratio: 0.45  AORTA Ao  Root diam: 3.30 cm MITRAL VALVE MV Area (PHT): 4.60 cm     SHUNTS MV Decel Time: 165 msec     Systemic VTI:  0.20 m MV E velocity: 107.00 cm/s  Systemic Diam: 2.00 cm MV A velocity: 86.40 cm/s MV E/A ratio:  1.24 Redell Shallow MD Electronically signed by Redell Shallow MD Signature Date/Time: 04/05/2024/1:03:26 PM    Final     Cardiac Studies   2d echo 04/05/24   1. S/P TAVR with mean gradient 10 mmHg and trace AI.   2. Left ventricular ejection fraction, by estimation, is 35 to 40%. The  left ventricle has moderately decreased function. The left ventricle  demonstrates global hypokinesis. Left ventricular  diastolic parameters are  consistent with Grade II diastolic  dysfunction (pseudonormalization). The average left ventricular global  longitudinal strain is -8.7 %. The global longitudinal strain is abnormal.   3. Right ventricular systolic function is normal. The right ventricular  size is normal.   4. Left atrial size was severely dilated.   5. Right atrial size was severely dilated.   6. Moderate pleural effusion in the left lateral region.   7. The mitral valve is normal in structure. Mild mitral valve  regurgitation. No evidence of mitral stenosis.   8. The aortic valve has been repaired/replaced. Aortic valve  regurgitation is trivial. No aortic stenosis is present. There is a 23 mm  Sapien prosthetic (TAVR) valve present in the aortic position. Procedure  Date: 04/04/24.   9. The inferior vena cava is normal in size with greater than 50%  respiratory variability, suggesting right atrial pressure of 3 mmHg.   Patient Profile     89 y.o. male with CAD w/ prior NSTEMI 07/2021 s/p CABG (RIMA to LAD, LIMA to OM1, SVG to OM2), ischemic cardiomyopathy, hx CVA, PAD, bladder cancer, CKD IIIa, LBBB and aortic valve stenosis. He was admitted for evaluation and management of a/c CHF, acute on chronic anemia, AKI on CKD IIIb, elevated LFTs, pleural effusions requiring thoracentesis. 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 Structural/AHF team have assisted with care. EF found to be <20%, down from prior 50-55% in 12/2023. Underwent transfemoral TAVR 04/04/24.  Assessment & Plan    1. Acute systolic CHF with cardiogenic shock, pleural effusions requiring thoracentesis - 12/2023 EF 50-55% w/ reported moderate AS - 03/26/24 on admission EF <20% with severe AS - 04/05/24 POD1 TAVR EF up to 35-40% and bioprosthetic aortic valve functioning normally - Low EF at admission may be due to combination of severe AS and LBBB.   - CVP 5 today, no Lasix  needed.  - Can start Farxiga 10 mg daily  - Can start spironolactone 12.5 mg daily.   2. Severe AS - s/p TAVR 04/04/24 - POD1 echo with EF up to 35-40% and bioprosthetic aortic valve functioning normally - no antiplatelets currently in setting of anemia/thrombocytopenia  3. CAD - Elevated troponin - NSTEMI 2023 s/p CABG X 3 - per notes cardiac CT demonstrated patent RIMA-LAD and LIMA-OM1 as well as an occluded SVG-->OM which was not felt to explain his cardiomyopathy, angiography deferred - HS troponin 484, suspected demand ischemia - Statin initially held due to shock liver, back on rosuvastatin  10mg  daily since 03/28/24 - No aspirin  with significant anemia and thrombocytopenia.    4. Acute on chronic macrocytic anemia - No overt GI bleeding.  - IM also raising question of possible myeloproliferative disorder given prior trends - Hgb 7.9 => 7.7, transfuse < 7.    5. AKI on CKD IIIa - Baseline Scr 1.2-1.3, peaked 2.7 in setting of shock, 1.63 => 1.32 today  6. Elevated LFTs - Suspect shock liver, LFTs have trended down - US  abdomen with biliary sludge and gallbladder wall thickening  7. Question of Afib this admission - EKGs have shown NSR/SB with LBBB, will need to follow conduction as OP - currently NSR with occasional PACs, rare PVCs - TSH OK - Not a  great candidate for [facility] given anemia/thrombocytopenia.   8. Thrombocytopenia - Platelets continue to fall, down to 37 today.   - Not on any form of heparin  at this time.  - If not  improving tomorrow, will need hematology consult.   Ezra Shuck 04/06/2024

## 2024-04-06 NOTE — Progress Notes (Signed)
 Mobility Specialist Progress Note:    04/06/24 1414  Mobility  Activity Ambulated with assistance  Level of Assistance Contact guard assist, steadying assist  Assistive Device Front wheel walker  Distance Ambulated (ft) 50 ft  Activity Response Tolerated fair  Mobility Referral Yes  Mobility visit 1 Mobility  Mobility Specialist Start Time (ACUTE ONLY) 1414  Mobility Specialist Stop Time (ACUTE ONLY) 1439  Mobility Specialist Time Calculation (min) (ACUTE ONLY) 25 min   Pt pleasant and agreeable to session. Pt needing constant cueing otherwise will take time to get session started. No c/o any symptoms. Cue pt to stand more upright and to hold head up while walking. Returned pt to recliner w/ all needs met and RN in room.   Venetia Keel Mobility Specialist Please Neurosurgeon or Rehab Office at [phone]

## 2024-04-06 NOTE — Plan of Care (Signed)
   Problem: Education: Goal: Knowledge of General Education information will improve Description Including pain rating scale, medication(s)/side effects and non-pharmacologic comfort measures Outcome: Progressing   Problem: Clinical Measurements: Goal: Ability to maintain clinical measurements within normal limits will improve Outcome: Progressing   Problem: Activity: Goal: Risk for activity intolerance will decrease Outcome: Progressing

## 2024-04-06 NOTE — Progress Notes (Signed)
 PROGRESS NOTE    Danny [name]  FMW:[id number] DOB: 10/08/1933 DOA: 03/25/2024 PCP: [name] LABOR, MD  89/M w systolic CHF, aortic stenosis, CAD, CKD, and bladder cancer sp TURP who presented with generalized weakness and fatigue.  In ED, vol overloaded, cr 2.2 , troponin 375 and 360 , Lactic acid 2.2 , hgb 7.2  Echo with reduced EF,  with signs of hypoperfusion.  Placed on milrinone  for inotropic support.  -9/19 off milrinone . Consulted structural heart team.  -9/22 limited echocardiogram with sever low flow gradient aortic stenosis.  - 9/23 right and left thoracentesis, 1.3 L drained on left and 1.4 on right - 9/24, structural heart team consult, plan for TAVR on 9/30, suggested to keep as inpatient -9/26: underwent TAVR  Subjective: Feels well, no complaints, ambulated in the halls yesterday  Assessment and Plan:  Acute on chronic systolic CHF Severe MR, TR Severe LFLG Aortic stenosis -Echo w EF < 20%, RV with mod reduced, severe mitral valve regurgitation, moderate to severe tricuspid valve regurgitation, low flow severe aortic valve stenosis with moderate regurgitation, moderate pulmonary valve regurgitation,  - concern for low output on admission, started on milrinone  with diuretics -Weaned off milrinone  on 9/19  - GDMT limited by hypotension, CVP was low, diuretics were on hold - Structural heart team following, underwent TAVR 9/26, repeat echo noted- Palliative care following, DNR - Increase activity, PT - Discharge planning, home soon if platelets start improving  Abnormal LFTs, shock liver Improved  AKI on CKD 3a - Baseline creatinine 1.3, peaked at 2.7 in the setting of cardiogenic shock - Improving, diuretics on hold now  Coronary artery disease History of CABG 2023, at bedtime troponin was 484, felt to be demand ischemia from CHF - Statin held for shock liver, aspirin  held with severe anemia - Restart statin  PAD (peripheral artery disease) Continue  statin, aspirin  on hold  Macrocytic anemia Thrombocytopenia.  - Suspect underlying myeloproliferative disorder, mild anemia and thrombocytopenia have been ongoing issues for at least 2 years, worse now - B12 -normal - Indirect bilirubin is normal, hemolysis less likely - transfused 1 unit PRBC last week - Platelets lower, received Heparin  for TAVR 9/26> likely contributing to worsening, monitor, no bleeding at this time  Abnormal Bladder on CT - CT notes, Intraluminal gas within the urinary bladder including scattered punctate foci along the posterior bladder wall. 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 -pt denies any new or concerning symptoms, suspect related to bladder CA -FU Urine CX, given TAVR yesterday I started empiric antibiotics, day 3 today, DC ABX after tonight's dose  History of bladder cancer Follow up as outpatient.   DVT prophylaxis: SCDs Code Status: DNR Family Communication: none present Disposition Plan: Home with home health services  Consultants:    Procedures:   Antimicrobials:    Objective: Vitals:   04/06/24 0324 04/06/24 0325 04/06/24 0400 04/06/24 0810  BP: 116/61  113/62 (!) 122/58  Pulse: 60  65 71  Resp: 18  10 13   Temp: 97.7 F (36.5 C)   97.7 F (36.5 C)  TempSrc: Oral   Oral  SpO2: 98%  97% 98%  Weight:  55 kg    Height:        Intake/Output Summary (Last 24 hours) at 04/06/2024 1119 Last data filed at 04/06/2024 9187 Gross per 24 hour  Intake 1180 ml  Output 900 ml  Net 280 ml   Filed  Weights   04/04/24 0633 04/05/24 0355 04/06/24 0325  Weight: 53.7 kg 55 kg 55 kg    Examination:  Gen: Awake, Alert, Oriented X 3, chronically ill-appearing, no distress HEENT: no JVD Lungs: Good air movement bilaterally, CTAB CVS: S1S2/RRR systolic murmur Abd: soft, Non tender, non distended, BS present Extremities: No edema, right arm PICC  line Skin: no new rashes on exposed skin     Data Reviewed:   CBC: Recent Labs  Lab 04/03/24 0434 04/04/24 0404 04/05/24 0150 04/05/24 0545 04/06/24 0251  WBC 5.7 5.5 7.4 6.5 6.5  HGB 7.2* 8.3* 8.4* 7.9* 7.7*  HCT 21.1* 24.7* 25.0* 23.0* 22.3*  MCV 103.4* 100.8* 100.4* 100.4* 100.0  PLT 74* 75* 54* 48* 37*   Basic Metabolic Panel: Recent Labs  Lab 03/31/24 0602 04/01/24 0650 04/02/24 0500 04/03/24 0434 04/04/24 0404 04/05/24 0150 04/05/24 0545 04/06/24 0251  NA 131*   < > 131* 130* 131* 128* 129* 130*  K 4.7   < > 4.2 4.1 4.4 4.5 4.4 4.4  CL 101   < > 99 97* 99 94* 98 100  CO2 23   < > 24 26 25 22 23 22   GLUCOSE 108*   < > 125* 90 100* 101* 94 100*  BUN 55*   < > 60* 60* 46* 40* 40* 37*  CREATININE 1.75*   < > 1.92* 1.87* 1.50* 1.56* 1.63* 1.32*  CALCIUM  8.2*   < > 8.2* 7.9* 8.2* 7.8* 7.7* 7.8*  MG 1.9  --  1.8  --   --   --   --   --    < > = values in this interval not displayed.   GFR: Estimated Creatinine Clearance: 30.7 mL/min (A) (by C-G formula based on SCr of 1.32 mg/dL (H)). Liver Function Tests: Recent Labs  Lab 04/01/24 0650 04/03/24 0434 04/06/24 0251  AST 38 26 19  ALT 86* 50* 9  ALKPHOS 83 71 60  BILITOT 1.1 1.1 0.7  PROT 5.9* 5.2* 5.0*  ALBUMIN  2.6* 2.3* 2.2*   No results for input(s): LIPASE, AMYLASE in the last 168 hours.  No results for input(s): AMMONIA in the last 168 hours. Coagulation Profile: Recent Labs  Lab 04/03/24 2105  INR 1.2   Cardiac Enzymes: No results for input(s): CKTOTAL, CKMB, CKMBINDEX, TROPONINI in the last 168 hours. BNP (last 3 results) No results for input(s): PROBNP in the last 8760 hours. HbA1C: Recent Labs    04/03/24 2105  HGBA1C 5.1   CBG: No results for input(s): GLUCAP in the last 168 hours.  Lipid Profile: No results for input(s): CHOL, HDL, LDLCALC, TRIG, CHOLHDL, LDLDIRECT in the last 72 hours. Thyroid  Function Tests: No results for input(s): TSH,  T4TOTAL, FREET4, T3FREE, THYROIDAB in the last 72 hours. Anemia Panel: No results for input(s): VITAMINB12, FOLATE, FERRITIN, TIBC, IRON, RETICCTPCT in the last 72 hours.  Urine analysis:    Component Value Date/Time   COLORURINE YELLOW 04/04/2024 0340   APPEARANCEUR CLOUDY (A) 04/04/2024 0340   LABSPEC 1.017 04/04/2024 0340   PHURINE 5.0 04/04/2024 0340   GLUCOSEU NEGATIVE 04/04/2024 0340   HGBUR SMALL (A) 04/04/2024 0340   BILIRUBINUR NEGATIVE 04/04/2024 0340   BILIRUBINUR large (A) 10/20/2022 1608   BILIRUBINUR negative 01/19/2022 0924   KETONESUR NEGATIVE 04/04/2024 0340   PROTEINUR 30 (A) 04/04/2024 0340   UROBILINOGEN 4.0 (A) 10/20/2022 1608   UROBILINOGEN 0.2 11/28/2013 2032   NITRITE NEGATIVE 04/04/2024 0340   LEUKOCYTESUR LARGE (A) 04/04/2024 0340  Sepsis Labs: @LABRCNTIP (procalcitonin:4,lacticidven:4)  ) Recent Results (from the past 240 hours)  Surgical pcr screen     Status: None   Collection Time: 04/03/24  7:51 PM   Specimen: Nasal Mucosa; Nasal Swab  Result Value Ref Range Status   MRSA, PCR NEGATIVE NEGATIVE Final   Staphylococcus aureus NEGATIVE NEGATIVE Final    Comment: (NOTE) The Xpert SA Assay (FDA approved for NASAL specimens in patients 69 years of age and older), is one component of a comprehensive surveillance program. It is not intended to diagnose infection nor to guide or monitor treatment. Performed at [facility], [address] [place], [place] [address]      Radiology Studies: ECHOCARDIOGRAM COMPLETE Result Date: 04/05/2024    ECHOCARDIOGRAM REPORT   Patient Name:   Danny [name] Date of Exam: 04/05/2024 Medical Rec #:  [id number]      Height:       64.0 in Accession #:    [id number]     Weight:       121.3 lb Date of Birth:  [birth year]     BSA:          1.582 m Patient Age:    89 years       BP:           122/70 mmHg Patient Gender: M              HR:           77 bpm. Exam Location:  Inpatient Procedure: 2D  Echo, 3D Echo, Cardiac Doppler, Color Doppler and Strain Analysis            (Both Spectral and Color Flow Doppler were utilized during            procedure). Indications:    Post TAVR evaluation V43.3 / Z95.2  History:        Patient has prior history of Echocardiogram examinations, most                 recent 04/04/2024. CHF, CAD and Previous Myocardial Infarction,                 Prior CABG, Arrythmias:Bradycardia; Risk Factors:Hypertension                 and Dyslipidemia. Chronic kidney disease, stage 3a (HCC), S/P                 TAVR (transcatheter aortic valve replacement).                 Aortic Valve: 23 mm Sapien prosthetic, stented (TAVR) valve is                 present in the aortic position. Procedure Date: 04/04/24.  Sonographer:    Aida Pizza RCS Referring Phys: [id number] [facility] R THOMPSON  Sonographer Comments: Global longitudinal strain was attempted. IMPRESSIONS  1. S/P TAVR with mean gradient 10 mmHg and trace AI.  2. Left ventricular ejection fraction, by estimation, is 35 to 40%. The left ventricle has moderately decreased function. The left ventricle demonstrates global hypokinesis. Left ventricular diastolic parameters are consistent with Grade II diastolic dysfunction (pseudonormalization). The average left ventricular global longitudinal strain is -8.7 %. The global longitudinal strain is abnormal.  3. Right ventricular systolic function is normal. The right ventricular size is normal.  4. Left atrial size was severely dilated.  5. Right atrial size was severely dilated.  6. Moderate pleural effusion in the left  lateral region.  7. The mitral valve is normal in structure. Mild mitral valve regurgitation. No evidence of mitral stenosis.  8. The aortic valve has been repaired/replaced. Aortic valve regurgitation is trivial. No aortic stenosis is present. There is a 23 mm Sapien prosthetic (TAVR) valve present in the aortic position. Procedure Date: 04/04/24.  9. The inferior vena cava is  normal in size with greater than 50% respiratory variability, suggesting right atrial pressure of 3 mmHg. FINDINGS  Left Ventricle: Left ventricular ejection fraction, by estimation, is 35 to 40%. The left ventricle has moderately decreased function. The left ventricle demonstrates global hypokinesis. The average left ventricular global longitudinal strain is -8.7 %.  Strain was performed and the global longitudinal strain is abnormal. The left ventricular internal cavity size was normal in size. There is no left ventricular hypertrophy. Left ventricular diastolic parameters are consistent with Grade II diastolic dysfunction (pseudonormalization). Right Ventricle: The right ventricular size is normal. Right ventricular systolic function is normal. Left Atrium: Left atrial size was severely dilated. Right Atrium: Right atrial size was severely dilated. Pericardium: There is no evidence of pericardial effusion. Mitral Valve: The mitral valve is normal in structure. Mild mitral annular calcification. Mild mitral valve regurgitation. No evidence of mitral valve stenosis. Tricuspid Valve: The tricuspid valve is normal in structure. Tricuspid valve regurgitation is mild . No evidence of tricuspid stenosis. Aortic Valve: The aortic valve has been repaired/replaced. Aortic valve regurgitation is trivial. No aortic stenosis is present. Aortic valve mean gradient measures 10.5 mmHg. Aortic valve peak gradient measures 20.3 mmHg. Aortic valve area, by VTI measures 1.43 cm. There is a 23 mm Sapien prosthetic, stented (TAVR) valve present in the aortic position. Procedure Date: 04/04/24. Pulmonic Valve: The pulmonic valve was normal in structure. Pulmonic valve regurgitation is not visualized. No evidence of pulmonic stenosis. Aorta: The aortic root is normal in size and structure. Venous: The inferior vena cava is normal in size with greater than 50% respiratory variability, suggesting right atrial pressure of 3 mmHg.  IAS/Shunts: No atrial level shunt detected by color flow Doppler. Additional Comments: S/P TAVR with mean gradient 10 mmHg and trace AI. 3D was performed not requiring image post processing on an independent workstation and was abnormal. There is a moderate pleural effusion in the left lateral region.  LEFT VENTRICLE PLAX 2D LVIDd:         5.00 cm   Diastology LVIDs:         3.80 cm   LV e' medial:    4.79 cm/s LV PW:         0.90 cm   LV E/e' medial:  22.3 LV IVS:        1.00 cm   LV e' lateral:   10.00 cm/s LVOT diam:     2.00 cm   LV E/e' lateral: 10.7 LV SV:         62 LV SV Index:   39        2D Longitudinal Strain LVOT Area:     3.14 cm  2D Strain GLS Avg:     -8.7 %                           3D Volume EF:                          3D EF:        44 %  LV EDV:       173 ml                          LV ESV:       97 ml                          LV SV:        76 ml RIGHT VENTRICLE RV S prime:     13.50 cm/s TAPSE (M-mode): 1.8 cm LEFT ATRIUM             Index        RIGHT ATRIUM           Index LA diam:        4.10 cm 2.59 cm/m   RA Area:     23.80 cm LA Vol (A2C):   69.0 ml 43.63 ml/m  RA Volume:   81.30 ml  51.41 ml/m LA Vol (A4C):   76.1 ml 48.12 ml/m LA Biplane Vol: 75.6 ml 47.80 ml/m  AORTIC VALVE AV Area (Vmax):    1.49 cm AV Area (Vmean):   1.44 cm AV Area (VTI):     1.43 cm AV Vmax:           225.50 cm/s AV Vmean:          149.000 cm/s AV VTI:            0.438 m AV Peak Grad:      20.3 mmHg AV Mean Grad:      10.5 mmHg LVOT Vmax:         107.00 cm/s LVOT Vmean:        68.350 cm/s LVOT VTI:          0.198 m LVOT/AV VTI ratio: 0.45  AORTA Ao Root diam: 3.30 cm MITRAL VALVE MV Area (PHT): 4.60 cm     SHUNTS MV Decel Time: 165 msec     Systemic VTI:  0.20 m MV E velocity: 107.00 cm/s  Systemic Diam: 2.00 cm MV A velocity: 86.40 cm/s MV E/A ratio:  1.24 Redell Shallow MD Electronically signed by Redell Shallow MD Signature Date/Time: 04/05/2024/1:03:26 PM    Final       Scheduled Meds:  Chlorhexidine  Gluconate Cloth  6 each Topical Daily   Influenza vac split trivalent PF  0.5 mL Intramuscular Tomorrow-1000   melatonin  3 mg Oral QHS   rosuvastatin   10 mg Oral Daily   sodium chloride  flush  3 mL Intravenous Q12H   sodium chloride  flush  3 mL Intravenous Q12H   Continuous Infusions:  sodium chloride      cefTRIAXone  (ROCEPHIN )  IV Stopped (04/05/24 1547)     LOS: 11 days    Time spent:    Sigurd Pac, MD [facility]   04/06/2024, 11:19 AM

## 2024-04-07 DIAGNOSIS — I5043 Acute on chronic combined systolic (congestive) and diastolic (congestive) heart failure: Secondary | ICD-10-CM

## 2024-04-07 DIAGNOSIS — D696 Thrombocytopenia, unspecified: Secondary | ICD-10-CM | POA: Diagnosis not present

## 2024-04-07 LAB — CBC WITH DIFFERENTIAL/PLATELET
Abs Immature Granulocytes: 0.06 K/uL (ref 0.00–0.07)
Basophils Absolute: 0 K/uL (ref 0.0–0.1)
Basophils Relative: 0 %
Eosinophils Absolute: 0 K/uL (ref 0.0–0.5)
Eosinophils Relative: 0 %
HCT: 23.5 % — ABNORMAL LOW (ref 39.0–52.0)
Hemoglobin: 7.8 g/dL — ABNORMAL LOW (ref 13.0–17.0)
Immature Granulocytes: 1 %
Lymphocytes Relative: 22 %
Lymphs Abs: 1.4 K/uL (ref 0.7–4.0)
MCH: 33.6 pg (ref 26.0–34.0)
MCHC: 33.2 g/dL (ref 30.0–36.0)
MCV: 101.3 fL — ABNORMAL HIGH (ref 80.0–100.0)
Monocytes Absolute: 0.2 K/uL (ref 0.1–1.0)
Monocytes Relative: 3 %
Neutro Abs: 4.8 K/uL (ref 1.7–7.7)
Neutrophils Relative %: 74 %
Platelets: 36 K/uL — ABNORMAL LOW (ref 150–400)
RBC: 2.32 MIL/uL — ABNORMAL LOW (ref 4.22–5.81)
RDW: 19.4 % — ABNORMAL HIGH (ref 11.5–15.5)
WBC: 6.4 K/uL (ref 4.0–10.5)
nRBC: 0 % (ref 0.0–0.2)

## 2024-04-07 LAB — TYPE AND SCREEN
ABO/RH(D): A POS
Antibody Screen: NEGATIVE
Unit division: 0
Unit division: 0
Unit division: 0

## 2024-04-07 LAB — URINE CULTURE: Culture: >=100000 — AB

## 2024-04-07 LAB — CBC
HCT: 22.4 % — ABNORMAL LOW (ref 39.0–52.0)
Hemoglobin: 7.7 g/dL — ABNORMAL LOW (ref 13.0–17.0)
MCH: 34.5 pg — ABNORMAL HIGH (ref 26.0–34.0)
MCHC: 34.4 g/dL (ref 30.0–36.0)
MCV: 100.4 fL — ABNORMAL HIGH (ref 80.0–100.0)
Platelets: 35 K/uL — ABNORMAL LOW (ref 150–400)
RBC: 2.23 MIL/uL — ABNORMAL LOW (ref 4.22–5.81)
RDW: 19.7 % — ABNORMAL HIGH (ref 11.5–15.5)
WBC: 7.2 K/uL (ref 4.0–10.5)
nRBC: 0 % (ref 0.0–0.2)

## 2024-04-07 LAB — POCT I-STAT, CHEM 8
BUN: 38 mg/dL — ABNORMAL HIGH (ref 8–23)
Calcium, Ion: 1.23 mmol/L (ref 1.15–1.40)
Chloride: 96 mmol/L — ABNORMAL LOW (ref 98–111)
Creatinine, Ser: 1.5 mg/dL — ABNORMAL HIGH (ref 0.61–1.24)
Glucose, Bld: 111 mg/dL — ABNORMAL HIGH (ref 70–99)
HCT: 21 % — ABNORMAL LOW (ref 39.0–52.0)
Hemoglobin: 7.1 g/dL — ABNORMAL LOW (ref 13.0–17.0)
Potassium: 4.4 mmol/L (ref 3.5–5.1)
Sodium: 131 mmol/L — ABNORMAL LOW (ref 135–145)
TCO2: 24 mmol/L (ref 22–32)

## 2024-04-07 LAB — HEPATITIS PANEL, ACUTE
HCV Ab: NONREACTIVE
Hep A IgM: NONREACTIVE
Hep B C IgM: NONREACTIVE
Hepatitis B Surface Ag: NONREACTIVE

## 2024-04-07 LAB — DIC (DISSEMINATED INTRAVASCULAR COAGULATION)PANEL
D-Dimer, Quant: 13.82 ug{FEU}/mL — ABNORMAL HIGH (ref 0.00–0.50)
Fibrinogen: 273 mg/dL (ref 210–475)
INR: 1.2 (ref 0.8–1.2)
Platelets: 34 K/uL — ABNORMAL LOW (ref 150–400)
Prothrombin Time: 15.5 s — ABNORMAL HIGH (ref 11.4–15.2)
Smear Review: NONE SEEN
aPTT: 34 s (ref 24–36)

## 2024-04-07 LAB — BASIC METABOLIC PANEL WITH GFR
Anion gap: 6 (ref 5–15)
BUN: 33 mg/dL — ABNORMAL HIGH (ref 8–23)
CO2: 23 mmol/L (ref 22–32)
Calcium: 7.8 mg/dL — ABNORMAL LOW (ref 8.9–10.3)
Chloride: 101 mmol/L (ref 98–111)
Creatinine, Ser: 1.35 mg/dL — ABNORMAL HIGH (ref 0.61–1.24)
GFR, Estimated: 51 mL/min — ABNORMAL LOW (ref >60)
Glucose, Bld: 90 mg/dL (ref 70–99)
Potassium: 4.5 mmol/L (ref 3.5–5.1)
Sodium: 130 mmol/L — ABNORMAL LOW (ref 135–145)

## 2024-04-07 LAB — IRON AND TIBC
Iron: 99 ug/dL (ref 45–182)
Saturation Ratios: 34 % (ref 17.9–39.5)
TIBC: 288 ug/dL (ref 250–450)
UIBC: 189 ug/dL

## 2024-04-07 LAB — BPAM RBC
Blood Product Expiration Date: 202510232359
Blood Product Expiration Date: 202510252359
Blood Product Expiration Date: 202510252359
ISSUE DATE / TIME: 202509251129
Unit Type and Rh: 6200
Unit Type and Rh: 6200
Unit Type and Rh: 6200

## 2024-04-07 LAB — GLUCOSE, CAPILLARY
Glucose-Capillary: 129 mg/dL — ABNORMAL HIGH (ref 70–99)
Glucose-Capillary: 123 mg/dL — ABNORMAL HIGH (ref 70–99)
Glucose-Capillary: 100 mg/dL — ABNORMAL HIGH (ref 70–99)

## 2024-04-07 LAB — COOXEMETRY PANEL
Carboxyhemoglobin: 1.7 % — ABNORMAL HIGH (ref 0.5–1.5)
Methemoglobin: <0.7 % (ref 0.0–1.5)
O2 Saturation: 68.9 %
Total hemoglobin: 8.2 g/dL — ABNORMAL LOW (ref 12.0–16.0)

## 2024-04-07 LAB — RETICULOCYTES
Immature Retic Fract: 14.8 % (ref 2.3–15.9)
RBC.: 2.32 MIL/uL — ABNORMAL LOW (ref 4.22–5.81)
Retic Count, Absolute: 47.6 K/uL (ref 19.0–186.0)
Retic Ct Pct: 2.1 % (ref 0.4–3.1)

## 2024-04-07 LAB — TECHNOLOGIST SMEAR REVIEW: Plt Morphology: NORMAL

## 2024-04-07 LAB — FERRITIN: Ferritin: 306 ng/mL (ref 24–336)

## 2024-04-07 LAB — FOLATE: Folate: 7.4 ng/mL (ref >5.9)

## 2024-04-07 LAB — LACTATE DEHYDROGENASE: LDH: 249 U/L — ABNORMAL HIGH (ref 98–192)

## 2024-04-07 LAB — POCT ACTIVATED CLOTTING TIME: Activated Clotting Time: 279 s

## 2024-04-07 LAB — VITAMIN B12: Vitamin B-12: 469 pg/mL (ref 180–914)

## 2024-04-07 MED ORDER — SPIRONOLACTONE 25 MG PO TABS
25.0000 mg | ORAL_TABLET | Freq: Every day | ORAL | Status: DC
  Administered 2024-04-07 – 2024-04-10 (×4): 25 mg via ORAL
  Filled 2024-04-07 (×5): qty 1

## 2024-04-07 MED ORDER — EMPAGLIFLOZIN 10 MG PO TABS
10.0000 mg | ORAL_TABLET | Freq: Every day | ORAL | Status: DC
  Administered 2024-04-07: 10 mg via ORAL
  Filled 2024-04-07: qty 1

## 2024-04-07 NOTE — Progress Notes (Signed)
 Physical Therapy Treatment Patient Details Name: Danny [name] MRN: [id number] DOB: 1934/03/10 Today's Date: 04/07/2024   History of Present Illness 89 y/o M adm 03/25/24 with weakness, fatigue HFpEF, elevated troponin, AKI and Severe AS. S/p thoracentesis 9/23. 9/29 CBC shows platelet count of 35 in the setting of progressive thrombocytopenia of unknown etiology or significance, MD plan hematology consult.  PMHx:  CAD s/p CABG, CHF, CKD, HTN, PAD, stroke, HLD, bladder CA    PT Comments  Pt received in supine, agreeable to therapy session with encouragement, pt needing increased time/encouragement to perform bed mobility and transfers, good participation in gait training once standing. Pt needing up to CGA for safety with functional mobility tasks and demonstrates improved standing tolerance/gait distance this session. Pt agreeable to sit up in chair to order dinner post-exertion and reports this is the first time he has been up in the chair today. Pt continues to benefit from PT services to progress toward functional mobility goals, continue to recommend HHPT upon DC.    If plan is discharge home, recommend the following: Assistance with cooking/housework;A little help with walking and/or transfers;A lot of help with bathing/dressing/bathroom;Assist for transportation;Help with stairs or ramp for entrance   Can travel by private vehicle        Equipment Recommendations  None recommended by PT    Recommendations for Other Services       Precautions / Restrictions Precautions Precautions: Fall Recall of Precautions/Restrictions: Intact Precaution/Restrictions Comments: watch SpO2 Restrictions Weight Bearing Restrictions Per Provider Order: No     Mobility  Bed Mobility Overal bed mobility: Modified Independent Bed Mobility: Rolling, Sidelying to Sit Rolling: Modified independent (Device/Increase time) Sidelying to sit: Supervision       General bed mobility comments: assist  with line mgmt; from nearly flat bed with no rails    Transfers Overall transfer level: Needs assistance Equipment used: Rolling walker (2 wheels) Transfers: Sit to/from Stand Sit to Stand: Contact guard assist           General transfer comment: from EOB>RW and RW>chair, cues for safer UE placement.    Ambulation/Gait Ambulation/Gait assistance: Contact guard assist Gait Distance (Feet): 190 Feet Assistive device: Rolling walker (2 wheels) Gait Pattern/deviations: Step-through pattern, Decreased stride length, Trunk flexed Gait velocity: reduced Gait velocity interpretation: <1.31 ft/sec, indicative of household ambulator   General Gait Details: Tactile and verbal cues provided for improved upright posture, but pt maintained a flexed trunk and inferior gaze with only brief cervical/trunk extension when cued. Cues provided to keep RW on ground when turning. No LOB, CGA for safety. SpO2 WFL on RA and HR 80's bpm wtih exertion.   Stairs             Wheelchair Mobility     Tilt Bed    Modified Rankin (Stroke Patients Only)       Balance Overall balance assessment: Needs assistance Sitting-balance support: Feet supported, No upper extremity supported Sitting balance-Leahy Scale: Good     Standing balance support: Bilateral upper extremity supported, During functional activity, Reliant on assistive device for balance Standing balance-Leahy Scale: Poor Standing balance comment: BUE support                            Communication Communication Communication: No apparent difficulties  Cognition Arousal: Alert Behavior During Therapy: WFL for tasks assessed/performed   PT - Cognitive impairments: No apparent impairments  PT - Cognition Comments: Slow processing at times Following commands: Impaired Following commands impaired: Follows multi-step commands with increased time, Follows one step commands with increased  time    Cueing Cueing Techniques: Verbal cues, Gestural cues  Exercises      General Comments General comments (skin integrity, edema, etc.): BP 118/68 sitting EOB HR 85 bpm, no dizziness but needs >3 mins between postural changes before agreeing to stand up.      Pertinent Vitals/Pain Pain Assessment Pain Assessment: Faces Faces Pain Scale: Hurts a little bit Pain Location: Not localized; increased time between postural changes, occasional grimacing. Pain Intervention(s): Limited activity within patient's tolerance, Repositioned    Home Living                          Prior Function            PT Goals (current goals can now be found in the care plan section) Acute Rehab PT Goals Patient Stated Goal: To return to independence so I am able to assist my daughter with my wife's care. PT Goal Formulation: With patient Time For Goal Achievement: 04/10/24 Progress towards PT goals: Progressing toward goals    Frequency    Min 2X/week      PT Plan      Co-evaluation              AM-PAC PT 6 Clicks Mobility   Outcome Measure  Help needed turning from your back to your side while in a flat bed without using bedrails?: None Help needed moving from lying on your back to sitting on the side of a flat bed without using bedrails?: None (w/o hospital lines) Help needed moving to and from a bed to a chair (including a wheelchair)?: A Little Help needed standing up from a chair using your arms (e.g., wheelchair or bedside chair)?: A Little Help needed to walk in hospital room?: A Little Help needed climbing 3-5 steps with a railing? : A Lot 6 Click Score: 19    End of Session Equipment Utilized During Treatment: Gait belt Activity Tolerance: Patient tolerated treatment well Patient left: in chair;with call bell/phone within reach;with chair alarm set Nurse Communication: Mobility status PT Visit Diagnosis: Other abnormalities of gait and mobility  (R26.89);Muscle weakness (generalized) (M62.81);Difficulty in walking, not elsewhere classified (R26.2);Unsteadiness on feet (R26.81)     Time: 8346-8278 PT Time Calculation (min) (ACUTE ONLY): 28 min  Charges:    $Gait Training: 8-22 mins $Therapeutic Activity: 8-22 mins PT General Charges $$ ACUTE PT VISIT: 1 Visit                     Ilamae Geng P., PTA Acute Rehabilitation Services Secure Chat Preferred 9a-5:30pm Office: [phone]    Connell CHRISTELLA Blue 04/07/2024, 5:50 PM

## 2024-04-07 NOTE — Consult Note (Addendum)
 [facility]  [phone] [phone]    HEMATOLOGY CONSULTATION  PURPOSE OF CONSULTATION/CHIEF COMPLAINT:  Thrombocytopenia  Referring MD:  Dr. Sigurd [name]   HPI: Danny [name] is an 89 year old male patient who was admitted for complaints of weakness on 03/26/24.   Platelets have been decreasing steadily since admission therefore hematology evaluation is now being requested. Patient seen awake and alert laying in bed. He is chronically ill-appearing. Patient's Danny [name] also at bedside.  Relates that he came to the hospital because he was feeling worn-out and that he was told he didn't look good.   Medical History significant for CHF, CAD, PAD, bladder cancer for which he had surgery and follows with Urology group at [facility].  Also admits to skin cancer on the top of his head for which he had a big surgery.  Surgical history significant for resection of bladder in 2024. Surgical removal of skin cancer on his scalp.   Also hx of cardiac cath, and appendectomy.  Family history is non-contributory.  Reports his mother died at age 19, unsure of the reason.  Social history significant for tobacco use with cigars, reports that he quit over 20 years ago.  Denies alcohol use.  Denies illicit drug use.  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     ASSESSMENT AND PLAN:  Thrombocytopenia -- Platelets decreasing 35K today, has shown steady decrease from 75K three days ago.  -- Patient reports no bleeding, noted multiple ecchymotic areas on bil UE -- Received platelet transfusion last week.  -- Status post heparin  received for TAVR on 9/26.  -- Labs ordered and pending including; haptoglobin, immature platelets, smear review, DIC, HIT, LDH, retic, hep panel. -- Monitor closely for bleeding -- Hematology-Oncology/Dr. Lanny will make further evaluation and treatment recommendations.   Anemia -- Likely multifactorial, related to renal dysfunction and  co-morbidities -- Hemoglobin low 7.8 today -- Iron studies done. Ferritin 306, iron 99, sat 34%.  No iron repletion required at this time.   -- B12 level stable 469 -- Recommend PRBC transfusion for HGB <7.0.  No transfusional intervention required at this time.  -- Continue to monitor CBC with differential.   Acute on chronic CHF History of CAD History of PAD -- status post CABG in 2023 -- LVEF 35-40% -- agree with holding aspirin  at this time until platelet count improves -- On statin -- Heart failure and Primary teams following  Shortness of breath Pleural effusion -- Status post thoracentesis -- Reports breathing much better today -- Monitor closely  AKI on CKD -- Elevated creatinine and BUN, improving -- Avoid nephrotoxic agents -- Monitor renal function  History of Bladder cancer -- status post bladder resection -- Continue follow up with Urology group at [facility].   History of Skin cancer, scalp -- Status post surgery -- Follow up with PCP       Past Medical History:  Diagnosis Date   (HFpEF) heart failure with preserved ejection fraction (HCC)    Echo 1/23: Inferior AK, mild aortic stenosis (mean 9 mmHg, V-max 210.5 cm/s, DI 0.70), EF 55-60, GR 1 DD, normal RVSF, normal PASP, trivial MR, mild AI, borderline dilation of aortic root (39 mm)   Acid reflux    hiatal hernia   Anemia    Bladder cancer (HCC)    CAD (coronary artery disease)    S/p non-STEMI 1/23 >> s/p CABG   Carotid artery disease 08/23/2021   Pre-CABG Dopplers 1/23:R ICA 40-59; [name] ICA  1-39 // Carotid US  07/20/2023: RICA 40-59; LICA 1-39   CHF (congestive heart failure) (HCC)    Chronic kidney disease (CKD)    Coronary artery disease involving native coronary artery of native heart without angina pectoris 05/16/2022   Inf NSTEMI s/p CABG in 07/2021 (RIMA-LAD, LIMA-OM1, S-OM2)   Diverticulitis    Erectile dysfunction 04/20/2015   Essential hypertension 04/20/2015   History of colon polyps     History of kidney stones    History of stroke    Noted on brain MRI 07/2021   Hyperlipidemia 05/28/2015   PAD (peripheral artery disease)    Pre-CABG Dopplers 1/23: Right ABI 0.65; left ABI 0.82   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 Wendel and Dr. Aneita   Skin cancer    Stroke [facility])    noted MRI 2023 pt. unaware  :  Past Surgical History:  Procedure Laterality Date   APPENDECTOMY     cataract surgery     CORONARY ARTERY BYPASS GRAFT N/A 08/01/2021   Procedure: CORONARY ARTERY BYPASS GRAFTING (CABG) X 3  ,ON PUMP, USING LEFT AND RIGHT INTERNAL MAMMARY ARTERIES, RIGHT AND LEFT ENDOSCOPIC GREATER SAPHENOUS VEIN CONDUITS;  Surgeon: Danny Dorise POUR, MD;  Location: [facility] OR;  Service: Open Heart Surgery;  Laterality: N/A;   ENDOVEIN HARVEST OF GREATER SAPHENOUS VEIN Right 08/01/2021   Procedure: ENDOVEIN HARVEST OF GREATER SAPHENOUS VEIN;  Surgeon: Danny Dorise POUR, MD;  Location: [facility] OR;  Service: Open Heart Surgery;  Laterality: Right;   INTRAOPERATIVE TRANSTHORACIC ECHOCARDIOGRAM N/A 04/04/2024   Procedure: ECHOCARDIOGRAM, TRANSTHORACIC;  Surgeon: Danny Danny POUR, MD;  Location: [facility] INVASIVE CV LAB;  Service: Cardiovascular;  Laterality: N/A;   LEFT HEART CATH AND CORONARY ANGIOGRAPHY N/A 07/27/2021   Procedure: LEFT HEART CATH AND CORONARY ANGIOGRAPHY;  Surgeon: Darron Danny LABOR, MD;  Location: [facility] INVASIVE CV LAB;  Service: Cardiovascular;  Laterality: N/A;   SKIN CANCER EXCISION     TEE WITHOUT CARDIOVERSION N/A 08/01/2021   Procedure: TRANSESOPHAGEAL ECHOCARDIOGRAM (TEE);  Surgeon: Danny Dorise POUR, MD;  Location: [facility] OR;  Service: Open Heart Surgery;  Laterality: N/A;   TRANSURETHRAL RESECTION OF BLADDER TUMOR N/A 01/03/2023   Procedure: TRANSURETHRAL RESECTION OF BLADDER TUMOR (TURBT);  Surgeon: Danny Lonni Righter, MD;  Location: [facility] ORS;  Service: Urology;  Laterality: N/A;  90 MINUTES    TRANSURETHRAL RESECTION OF BLADDER TUMOR N/A 10/10/2023   Procedure: TURBT (TRANSURETHRAL RESECTION OF BLADDER TUMOR);  Surgeon: Danny Lonni Righter, MD;  Location: [facility] ORS;  Service: Urology;  Laterality: N/A;   TRANSURETHRAL RESECTION OF PROSTATE    :  Allergies  Allergen Reactions   Lipitor [Atorvastatin]     Muscle soreness  :   Family History  Problem Relation Age of Onset   Stroke Father 77   Sudden death Mother 66       unknown cause  :   Social History   Socioeconomic History   Marital status: Married    Spouse name: Not on file   Number of children: 1   Years of education: 12   Highest education level: Not on file  Occupational History   Occupation: Retired  Tobacco Use   Smoking status: Former    Types: Cigars   Smokeless tobacco: Never   Tobacco comments:    Quit 2016  Vaping Use   Vaping status: Never Used  Substance and Sexual Activity   Alcohol  use: Yes    Comment: Occasional beer   Drug use: No   Sexual activity: Not Currently  Other Topics Concern   Not on file  Social History Narrative   Lives with wife and daughter lives with them also/2025   Caffeine use: 1-2 cups coffee in the am and 1 glass tea qpm   Social Drivers of Health   Financial Resource Strain: Medium Risk (11/23/2023)   Overall Financial Resource Strain (CARDIA)    Difficulty of Paying Living Expenses: Somewhat hard  Food Insecurity: No Food Insecurity (03/26/2024)   Hunger Vital Sign    Worried About Running Out of Food in the Last Year: Never true    Ran Out of Food in the Last Year: Never true  Transportation Needs: No Transportation Needs (03/26/2024)   PRAPARE - Administrator, Civil Service (Medical): No    Lack of Transportation (Non-Medical): No  Physical Activity: Inactive (11/23/2023)   Exercise Vital Sign    Days of Exercise per Week: 0 days    Minutes of Exercise per Session: 0 min  Stress: No Stress Concern Present (11/23/2023)   [employer]ational [employer] - Occupational Stress Questionnaire    Feeling of Stress : Only a little  Social Connections: Moderately Isolated (03/26/2024)   Social Connection and Isolation Panel    Frequency of Communication with Friends and Family: Once a week    Frequency of Social Gatherings with Friends and Family: Never    Attends Religious Services: 1 to 4 times per year    Active Member of [employer] or Organizations: No    Attends Banker Meetings: Never    Marital Status: Married  Catering manager Violence: Not At Risk (03/26/2024)   Humiliation, Afraid, Rape, and Kick questionnaire    Fear of Current or Ex-Partner: No    Emotionally Abused: No    Physically Abused: No    Sexually Abused: No  :   CURRENT MEDS: Current Facility-Administered Medications  Medication Dose Route Frequency Provider Last Rate Last Admin   0.9 %  sodium chloride  infusion  250 mL Intravenous PRN Thompson, Kathryn R, PA-C       acetaminophen  (TYLENOL ) tablet 650 mg  650 mg Oral Q6H PRN Thompson, Kathryn R, PA-C       Or   acetaminophen  (TYLENOL ) suppository 650 mg  650 mg Rectal Q6H PRN Thompson, Kathryn R, PA-C       cefTRIAXone  (ROCEPHIN ) 1 g in sodium chloride  0.9 % 100 mL IVPB  1 g Intravenous Q24H Joseph, Preetha, MD   Stopped at 04/06/24 1511   Chlorhexidine  Gluconate Cloth 2 % PADS 6 each  6 each Topical Daily Claudene Maximino LABOR, MD   6 each at 04/07/24 0941   empagliflozin (JARDIANCE) tablet 10 mg  10 mg Oral Daily McLean, Dalton S, MD   10 mg at 04/07/24 9061   guaiFENesin -dextromethorphan  (ROBITUSSIN DM) 100-10 MG/5ML syrup 5 mL  5 mL Oral Q4H PRN Arrien, Elidia Sieving, MD       Influenza vac split trivalent PF (FLUZONE HIGH-DOSE) injection 0.5 mL  0.5 mL Intramuscular Tomorrow-1000 Arrien, Elidia Sieving, MD       lip balm (CARMEX) ointment 1 Application  1 Application Topical PRN Arrien, Mauricio Daniel, MD       melatonin tablet 3 mg  3 mg Oral QHS Joseph, Preetha, MD   3 mg at 04/06/24  2255   morphine  (PF) 2 MG/ML injection 1-4 mg  1-4 mg  Intravenous Q1H PRN Sebastian Lamarr SAUNDERS, PA-C       ondansetron  (ZOFRAN ) injection 4 mg  4 mg Intravenous Q6H PRN Sebastian Lamarr SAUNDERS, PA-C       Oral care mouth rinse  15 mL Mouth Rinse PRN Claudene Maximino LABOR, MD       oxyCODONE  (Oxy IR/ROXICODONE ) immediate release tablet 5-10 mg  5-10 mg Oral Q3H PRN Sebastian Lamarr SAUNDERS, PA-C       rosuvastatin  (CRESTOR ) tablet 10 mg  10 mg Oral Daily Arrien, Elidia Sieving, MD   10 mg at 04/07/24 9061   senna (SENOKOT) tablet 8.6 mg  1 tablet Oral Daily PRN Charlton Evalene RAMAN, MD   8.6 mg at 04/02/24 2206   sodium chloride  flush (NS) 0.9 % injection 10-40 mL  10-40 mL Intracatheter PRN Claudene Maximino A, MD   10 mL at 04/05/24 1057   sodium chloride  flush (NS) 0.9 % injection 3 mL  3 mL Intravenous Q12H Smith, Rondell A, MD   3 mL at 04/07/24 0941   sodium chloride  flush (NS) 0.9 % injection 3 mL  3 mL Intravenous Q12H Sebastian Lamarr R, PA-C   3 mL at 04/07/24 0941   sodium chloride  flush (NS) 0.9 % injection 3 mL  3 mL Intravenous PRN Sebastian Lamarr SAUNDERS, PA-C       spironolactone (ALDACTONE) tablet 25 mg  25 mg Oral Daily McLean, Dalton S, MD   25 mg at 04/07/24 9061   traMADol  (ULTRAM ) tablet 50-100 mg  50-100 mg Oral Q4H PRN Sebastian Lamarr SAUNDERS, PA-C       trimethobenzamide  (TIGAN ) injection 200 mg  200 mg Intramuscular Q6H PRN Claudene Maximino LABOR, MD        PHYSICAL EXAMINATION: ECOG PERFORMANCE STATUS: 3 - Symptomatic, >50% confined to bed  Vitals:   04/07/24 0800 04/07/24 1201  BP: 121/67 119/64  Pulse: 71 78  Resp: 14 18  Temp:  (!) 97.5 F (36.4 C)  SpO2: 96% 99%   Filed Weights   04/05/24 0355 04/06/24 0325 04/07/24 0621  Weight: 121 lb 4.1 oz (55 kg) 121 lb 4.1 oz (55 kg) 123 lb 14.4 oz (56.2 kg)    GENERAL: alert, +chronically ill-appearing SKIN: +multiple ecchymoses on bil UE  EYES: normal, conjunctiva are pink and non-injected, sclera clear OROPHARYNX: no exudate, no erythema and  lips, buccal mucosa, and tongue normal  NECK: supple, thyroid  normal size, non-tender, without nodularity LYMPH: no palpable lymphadenopathy in the cervical, axillary or inguinal LUNGS: clear to auscultation and percussion with normal breathing effort HEART: regular rate & rhythm and no murmurs and no lower extremity edema ABDOMEN: abdomen soft, non-tender and normal bowel sounds MUSCULOSKELETAL: no cyanosis of digits and no clubbing  PSYCH: alert & oriented x 3 with fluent speech NEURO: no focal motor/sensory deficits   LABS: Lab Results  Component Value Date   WBC 6.4 04/07/2024   HGB 7.8 ([name]) 04/07/2024   HCT 23.5 ([name]) 04/07/2024   MCV 101.3 (H) 04/07/2024   PLT 34 ([name]) 04/07/2024   PLT 36 ([name]) 04/07/2024    Lab Results  Component Value Date   WBC 6.4 04/07/2024   HGB 7.8 ([name]) 04/07/2024   HCT 23.5 ([name]) 04/07/2024   PLT 34 ([name]) 04/07/2024   PLT 36 ([name]) 04/07/2024   GLUCOSE 90 04/07/2024   CHOL 192 10/10/2022   TRIG 54.0 10/10/2022   HDL 66.80 10/10/2022   LDLCALC 114 (H) 10/10/2022   ALT 9 04/06/2024   AST 19 04/06/2024  NA 130 ([name]) 04/07/2024   K 4.5 04/07/2024   CL 101 04/07/2024   CREATININE 1.35 (H) 04/07/2024   BUN 33 (H) 04/07/2024   CO2 23 04/07/2024   INR 1.2 04/07/2024   HGBA1C 5.1 04/03/2024    ECHOCARDIOGRAM COMPLETE Result Date: 04/05/2024    ECHOCARDIOGRAM REPORT   Patient Name:   Danny [name] Date of Exam: 04/05/2024 Medical Rec #:  [id number]      Height:       64.0 in Accession #:    [id number]     Weight:       121.3 lb Date of Birth:  27-Aug-1933     BSA:          1.582 m Patient Age:    89 years       BP:           122/70 mmHg Patient Gender: M              HR:           77 bpm. Exam Location:  Inpatient Procedure: 2D Echo, 3D Echo, Cardiac Doppler, Color Doppler and Strain Analysis            (Both Spectral and Color Flow Doppler were utilized during            procedure). Indications:    Post TAVR evaluation V43.3 / Z95.2  History:        Patient has  prior history of Echocardiogram examinations, most                 recent 04/04/2024. CHF, CAD and Previous Myocardial Infarction,                 Prior CABG, Arrythmias:Bradycardia; Risk Factors:Hypertension                 and Dyslipidemia. Chronic kidney disease, stage 3a (HCC), S/P                 TAVR (transcatheter aortic valve replacement).                 Aortic Valve: 23 mm Sapien prosthetic, stented (TAVR) valve is                 present in the aortic position. Procedure Date: 04/04/24.  Sonographer:    Aida Pizza RCS Referring Phys: [id number] [facility] R THOMPSON  Sonographer Comments: Global longitudinal strain was attempted. IMPRESSIONS  1. S/P TAVR with mean gradient 10 mmHg and trace AI.  2. Left ventricular ejection fraction, by estimation, is 35 to 40%. The left ventricle has moderately decreased function. The left ventricle demonstrates global hypokinesis. Left ventricular diastolic parameters are consistent with Grade II diastolic dysfunction (pseudonormalization). The average left ventricular global longitudinal strain is -8.7 %. The global longitudinal strain is abnormal.  3. Right ventricular systolic function is normal. The right ventricular size is normal.  4. Left atrial size was severely dilated.  5. Right atrial size was severely dilated.  6. Moderate pleural effusion in the left lateral region.  7. The mitral valve is normal in structure. Mild mitral valve regurgitation. No evidence of mitral stenosis.  8. The aortic valve has been repaired/replaced. Aortic valve regurgitation is trivial. No aortic stenosis is present. There is a 23 mm Sapien prosthetic (TAVR) valve present in the aortic position. Procedure Date: 04/04/24.  9. The inferior vena cava is normal in size with greater than 50% respiratory variability, suggesting right atrial pressure of  3 mmHg. FINDINGS  Left Ventricle: Left ventricular ejection fraction, by estimation, is 35 to 40%. The left ventricle has moderately decreased  function. The left ventricle demonstrates global hypokinesis. The average left ventricular global longitudinal strain is -8.7 %.  Strain was performed and the global longitudinal strain is abnormal. The left ventricular internal cavity size was normal in size. There is no left ventricular hypertrophy. Left ventricular diastolic parameters are consistent with Grade II diastolic dysfunction (pseudonormalization). Right Ventricle: The right ventricular size is normal. Right ventricular systolic function is normal. Left Atrium: Left atrial size was severely dilated. Right Atrium: Right atrial size was severely dilated. Pericardium: There is no evidence of pericardial effusion. Mitral Valve: The mitral valve is normal in structure. Mild mitral annular calcification. Mild mitral valve regurgitation. No evidence of mitral valve stenosis. Tricuspid Valve: The tricuspid valve is normal in structure. Tricuspid valve regurgitation is mild . No evidence of tricuspid stenosis. Aortic Valve: The aortic valve has been repaired/replaced. Aortic valve regurgitation is trivial. No aortic stenosis is present. Aortic valve mean gradient measures 10.5 mmHg. Aortic valve peak gradient measures 20.3 mmHg. Aortic valve area, by VTI measures 1.43 cm. There is a 23 mm Sapien prosthetic, stented (TAVR) valve present in the aortic position. Procedure Date: 04/04/24. Pulmonic Valve: The pulmonic valve was normal in structure. Pulmonic valve regurgitation is not visualized. No evidence of pulmonic stenosis. Aorta: The aortic root is normal in size and structure. Venous: The inferior vena cava is normal in size with greater than 50% respiratory variability, suggesting right atrial pressure of 3 mmHg. IAS/Shunts: No atrial level shunt detected by color flow Doppler. Additional Comments: S/P TAVR with mean gradient 10 mmHg and trace AI. 3D was performed not requiring image post processing on an independent workstation and was abnormal. There is a  moderate pleural effusion in the left lateral region.  LEFT VENTRICLE PLAX 2D LVIDd:         5.00 cm   Diastology LVIDs:         3.80 cm   LV e' medial:    4.79 cm/s LV PW:         0.90 cm   LV E/e' medial:  22.3 LV IVS:        1.00 cm   LV e' lateral:   10.00 cm/s LVOT diam:     2.00 cm   LV E/e' lateral: 10.7 LV SV:         62 LV SV Index:   39        2D Longitudinal Strain LVOT Area:     3.14 cm  2D Strain GLS Avg:     -8.7 %                           3D Volume EF:                          3D EF:        44 %                          LV EDV:       173 ml                          LV ESV:       97 ml  LV SV:        76 ml RIGHT VENTRICLE RV S prime:     13.50 cm/s TAPSE (M-mode): 1.8 cm LEFT ATRIUM             Index        RIGHT ATRIUM           Index LA diam:        4.10 cm 2.59 cm/m   RA Area:     23.80 cm LA Vol (A2C):   69.0 ml 43.63 ml/m  RA Volume:   81.30 ml  51.41 ml/m LA Vol (A4C):   76.1 ml 48.12 ml/m LA Biplane Vol: 75.6 ml 47.80 ml/m  AORTIC VALVE AV Area (Vmax):    1.49 cm AV Area (Vmean):   1.44 cm AV Area (VTI):     1.43 cm AV Vmax:           225.50 cm/s AV Vmean:          149.000 cm/s AV VTI:            0.438 m AV Peak Grad:      20.3 mmHg AV Mean Grad:      10.5 mmHg LVOT Vmax:         107.00 cm/s LVOT Vmean:        68.350 cm/s LVOT VTI:          0.198 m LVOT/AV VTI ratio: 0.45  AORTA Ao Root diam: 3.30 cm MITRAL VALVE MV Area (PHT): 4.60 cm     SHUNTS MV Decel Time: 165 msec     Systemic VTI:  0.20 m MV E velocity: 107.00 cm/s  Systemic Diam: 2.00 cm MV A velocity: 86.40 cm/s MV E/A ratio:  1.24 Redell Shallow MD Electronically signed by Redell Shallow MD Signature Date/Time: 04/05/2024/1:03:26 PM    Final    Structural Heart Procedure Result Date: 04/04/2024 See surgical note for result.  DG CHEST PORT 1 VIEW Result Date: 04/02/2024 CLINICAL DATA:  Bilateral pleural effusions. EXAM: PORTABLE CHEST 1 VIEW COMPARISON:  Radiograph and CT yesterday FINDINGS:  Stable right upper extremity PICC tip in the SVC. Unchanged cardiomegaly post median sternotomy and CABG. Trace bilateral pleural effusions. Scattered atelectasis. Improving ground-glass opacities in the upper lung zones. No pneumothorax. IMPRESSION: 1. Trace bilateral pleural effusions. 2. Improving ground-glass opacities in the upper lung zones. Electronically Signed   By: Andrea Gasman M.D.   On: 04/02/2024 12:50   CT ANGIO CHEST AORTA W/CM & OR WO/CM Result Date: 04/01/2024 CLINICAL DATA:  Preoperative evaluation for aortic valve replacement. History of bladder cancer. * Tracking Code: BO * EXAM: CT ANGIOGRAPHY CHEST, ABDOMEN AND PELVIS TECHNIQUE: Multidetector CT imaging through the chest, abdomen and pelvis was performed using the standard protocol during bolus administration of intravenous contrast. Multiplanar reconstructed images and MIPs were obtained and reviewed to evaluate the vascular anatomy. RADIATION DOSE REDUCTION: This exam was performed according to the departmental dose-optimization program which includes automated exposure control, adjustment of the mA and/or kV according to patient size and/or use of iterative reconstruction technique. CONTRAST:  OMNIPAQUE  IOHEXOL  350 MG/ML SOLN COMPARISON:  CT abdomen and pelvis dated 10/19/2022 FINDINGS: CTA CHEST FINDINGS Cardiovascular: Right upper extremity PICC tip terminates in the lower SVC. Preferential opacification of the thoracic aorta. No evidence of thoracic aortic aneurysm or dissection. Irregular noncalcified atherosclerotic plaque without substantial luminal narrowing along the descending thoracic aorta. No pericardial effusion. Coronary artery calcifications. Reflux of contrast material into the hepatic  veins, suggesting a degree of right heart dysfunction. Please see separately dictated cardiac CT report for detailed findings. Mediastinum/Nodes: Imaged thyroid  gland without nodules meeting criteria for imaging follow-up by size.  Normal esophagus. No pathologically enlarged axillary, supraclavicular, mediastinal, or hilar lymph nodes. Lungs/Pleura: The central airways are patent. Mild interlobular septal thickening with interspersed areas of ground-glass opacities in the bilateral upper lobes. Near-complete relaxation atelectasis of the bilateral lower lobes. No pneumothorax. Pleural effusions. Musculoskeletal: Median sternotomy wires are nondisplaced. No acute or abnormal lytic or blastic osseous lesions. Review of the MIP images confirms the above findings. CTA ABDOMEN AND PELVIS FINDINGS VASCULAR Aorta: Aortic atherosclerosis. Irregular noncalcified atherosclerotic plaque results in moderate luminal narrowing of the juxtarenal and infrarenal abdominal aorta. No aneurysm or dissection. Celiac: Patent without evidence of aneurysm, dissection, vasculitis or significant stenosis. Segmental mild to moderate luminal narrowing of the splenic artery due to calcified plaque. SMA: Segmental mild-to-moderate luminal narrowing of the SMA due to atherosclerotic plaque. Replaced hepatic artery arises from the SMA. Renals: Single bilateral renal arteries. Mild luminal narrowing of the proximal arteries due to atherosclerotic plaque. IMA: Severe luminal narrowing of the IMA origin. Inflow: Patent without evidence of aneurysm, dissection, vasculitis or significant stenosis. Proximal Outflow: Bilateral common femoral and visualized portions of the superficial and profunda femoral arteries are patent without evidence of aneurysm, dissection, vasculitis or significant stenosis. Veins: No obvious venous abnormality within the limitations of this arterial phase study. Review of the MIP images confirms the above findings. NON-VASCULAR Hepatobiliary: No focal hepatic lesions. No intra or extrahepatic biliary ductal dilation. Normal gallbladder. Pancreas: No focal lesions or main ductal dilation. Spleen: Normal in size without focal abnormality. Adrenals/Urinary  Tract: No adrenal nodules. Again seen are multiple bilateral renal cysts, including peripherally calcified irregular structure arising exophytically from the lower pole left kidney with an adjacent intermediate attenuation cyst (6:132). Some of the cysts are simple while others are hemorrhagic/proteinaceous. No hydronephrosis or calculi. Underdistended urinary bladder contains anti dependent intraluminal gas as well as multiple scattered punctate foci of gas along the posterior urinary bladder. There is irregular mural thickening along the right bladder wall with a small nodular focus protruding into the bladder lumen measuring 7 mm (6:177). A mass in this area previously measured 2.1 cm. Stomach/Bowel: Normal appearance of the stomach. No evidence of bowel wall thickening, distention, or inflammatory changes. Appendix is not discretely seen. Lymphatic: No enlarged abdominal or pelvic lymph nodes. Reproductive: Prostate is unremarkable. Other: Small volume presacral free fluid. No free air or fluid collection. Musculoskeletal: No acute or abnormal lytic or blastic osseous lesions. Diffuse body wall edema. Review of the MIP images confirms the above findings. IMPRESSION: 1. Irregular noncalcified atherosclerotic plaque results in moderate luminal narrowing of the juxtarenal and infrarenal abdominal aorta. No aneurysm or dissection. 2. Mild interlobular septal thickening with interspersed areas of ground-glass opacities in the bilateral upper lobes, likely pulmonary edema. 3. Bilateral large pleural effusions with near-complete relaxation atelectasis of the bilateral lower lobes. 4. Irregular mural thickening along the right bladder wall with a small nodular focus protruding into the bladder lumen measuring 7 mm. A mass in this area previously measured 2.1 cm. Findings are consistent with known bladder cancer. 5. Intraluminal gas within the urinary bladder including scattered punctate foci along the posterior bladder  wall. 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 6. Multiple bilateral renal cysts, some of which are simple while others are hemorrhagic/proteinaceous. These  are not substantially changed in size compared to 10/19/2022 and better evaluated on prior CT dated 11/29/2022. 7. Reflux of contrast material into the hepatic veins, suggesting a degree of right heart dysfunction. 8.  Aortic Atherosclerosis (ICD10-I70.0). Electronically Signed   By: Limin  Xu M.D.   On: 04/01/2024 18:28   CT Angio Abd/Pel w/ and/or w/o Result Date: 04/01/2024 CLINICAL DATA:  Preoperative evaluation for aortic valve replacement. History of bladder cancer. * Tracking Code: BO * EXAM: CT ANGIOGRAPHY CHEST, ABDOMEN AND PELVIS TECHNIQUE: Multidetector CT imaging through the chest, abdomen and pelvis was performed using the standard protocol during bolus administration of intravenous contrast. Multiplanar reconstructed images and MIPs were obtained and reviewed to evaluate the vascular anatomy. RADIATION DOSE REDUCTION: This exam was performed according to the departmental dose-optimization program which includes automated exposure control, adjustment of the mA and/or kV according to patient size and/or use of iterative reconstruction technique. CONTRAST:  OMNIPAQUE  IOHEXOL  350 MG/ML SOLN COMPARISON:  CT abdomen and pelvis dated 10/19/2022 FINDINGS: CTA CHEST FINDINGS Cardiovascular: Right upper extremity PICC tip terminates in the lower SVC. Preferential opacification of the thoracic aorta. No evidence of thoracic aortic aneurysm or dissection. Irregular noncalcified atherosclerotic plaque without substantial luminal narrowing along the descending thoracic aorta. No pericardial effusion. Coronary artery calcifications. Reflux of contrast material into the hepatic veins, suggesting a degree of right heart dysfunction. Please see separately  dictated cardiac CT report for detailed findings. Mediastinum/Nodes: Imaged thyroid  gland without nodules meeting criteria for imaging follow-up by size. Normal esophagus. No pathologically enlarged axillary, supraclavicular, mediastinal, or hilar lymph nodes. Lungs/Pleura: The central airways are patent. Mild interlobular septal thickening with interspersed areas of ground-glass opacities in the bilateral upper lobes. Near-complete relaxation atelectasis of the bilateral lower lobes. No pneumothorax. Pleural effusions. Musculoskeletal: Median sternotomy wires are nondisplaced. No acute or abnormal lytic or blastic osseous lesions. Review of the MIP images confirms the above findings. CTA ABDOMEN AND PELVIS FINDINGS VASCULAR Aorta: Aortic atherosclerosis. Irregular noncalcified atherosclerotic plaque results in moderate luminal narrowing of the juxtarenal and infrarenal abdominal aorta. No aneurysm or dissection. Celiac: Patent without evidence of aneurysm, dissection, vasculitis or significant stenosis. Segmental mild to moderate luminal narrowing of the splenic artery due to calcified plaque. SMA: Segmental mild-to-moderate luminal narrowing of the SMA due to atherosclerotic plaque. Replaced hepatic artery arises from the SMA. Renals: Single bilateral renal arteries. Mild luminal narrowing of the proximal arteries due to atherosclerotic plaque. IMA: Severe luminal narrowing of the IMA origin. Inflow: Patent without evidence of aneurysm, dissection, vasculitis or significant stenosis. Proximal Outflow: Bilateral common femoral and visualized portions of the superficial and profunda femoral arteries are patent without evidence of aneurysm, dissection, vasculitis or significant stenosis. Veins: No obvious venous abnormality within the limitations of this arterial phase study. Review of the MIP images confirms the above findings. NON-VASCULAR Hepatobiliary: No focal hepatic lesions. No intra or extrahepatic biliary  ductal dilation. Normal gallbladder. Pancreas: No focal lesions or main ductal dilation. Spleen: Normal in size without focal abnormality. Adrenals/Urinary Tract: No adrenal nodules. Again seen are multiple bilateral renal cysts, including peripherally calcified irregular structure arising exophytically from the lower pole left kidney with an adjacent intermediate attenuation cyst (6:132). Some of the cysts are simple while others are hemorrhagic/proteinaceous. No hydronephrosis or calculi. Underdistended urinary bladder contains anti dependent intraluminal gas as well as multiple scattered punctate foci of gas along the posterior urinary bladder. There is irregular mural thickening along the right bladder wall with a small nodular focus  protruding into the bladder lumen measuring 7 mm (6:177). A mass in this area previously measured 2.1 cm. Stomach/Bowel: Normal appearance of the stomach. No evidence of bowel wall thickening, distention, or inflammatory changes. Appendix is not discretely seen. Lymphatic: No enlarged abdominal or pelvic lymph nodes. Reproductive: Prostate is unremarkable. Other: Small volume presacral free fluid. No free air or fluid collection. Musculoskeletal: No acute or abnormal lytic or blastic osseous lesions. Diffuse body wall edema. Review of the MIP images confirms the above findings. IMPRESSION: 1. Irregular noncalcified atherosclerotic plaque results in moderate luminal narrowing of the juxtarenal and infrarenal abdominal aorta. No aneurysm or dissection. 2. Mild interlobular septal thickening with interspersed areas of ground-glass opacities in the bilateral upper lobes, likely pulmonary edema. 3. Bilateral large pleural effusions with near-complete relaxation atelectasis of the bilateral lower lobes. 4. Irregular mural thickening along the right bladder wall with a small nodular focus protruding into the bladder lumen measuring 7 mm. A mass in this area previously measured 2.1 cm.  Findings are consistent with known bladder cancer. 5. Intraluminal gas within the urinary bladder including scattered punctate foci along the posterior bladder wall. 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 6. Multiple bilateral renal cysts, some of which are simple while others are hemorrhagic/proteinaceous. These are not substantially changed in size compared to 10/19/2022 and better evaluated on prior CT dated 11/29/2022. 7. Reflux of contrast material into the hepatic veins, suggesting a degree of right heart dysfunction. 8.  Aortic Atherosclerosis (ICD10-I70.0). Electronically Signed   By: Limin  Xu M.D.   On: 04/01/2024 18:28   DG CHEST PORT 1 VIEW Result Date: 04/01/2024 CLINICAL DATA:  Pleural effusion.  Post bilateral thoracentesis. EXAM: PORTABLE CHEST 1 VIEW COMPARISON:  Chest radiograph 03/25/2024.  CT chest 04/01/2024 FINDINGS: Postoperative changes in the mediastinum. Mild cardiac enlargement. No vascular congestion or edema. Minimal bilateral or pleural effusions representing interval decreased since prior CT post thoracentesis. No pneumothorax. Mediastinal contours appear intact. A right PICC line is in place with tip projecting over the low SVC region. Calcification in the aorta. Probable emphysematous changes in the lungs. Peribronchial thickening with perihilar infiltrates likely representing chronic bronchitis. IMPRESSION: 1. Minimal if any residual pleural effusions post thoracentesis. No pneumothorax. 2. Mild cardiac enlargement. 3. 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 Electronically Signed   By: Elsie Gravely M.D.   On: 04/01/2024 18:17   CT CORONARY MORPH W/CTA COR W/SCORE W/CA W/CM &/OR WO/CM Addendum Date: 04/01/2024 ADDENDUM REPORT: 04/01/2024 14:26 EXAM: OVER-READ INTERPRETATION  CT CHEST The following report is an over-read performed by radiologist Dr. Marcey Diones  [facility] on 04/01/2024. This over-read does not include interpretation of cardiac or coronary anatomy or pathology. The coronary CTA interpretation by the cardiologist is attached. COMPARISON:  None. FINDINGS: The heart size is within normal limits. No pericardial fluid is identified. Visualized segments of the thoracic aorta and central pulmonary arteries are normal in caliber. Visualized mediastinum and hilar regions demonstrate no lymphadenopathy or masses. Pulmonary edema with moderate bilateral pleural effusions. Associated compressive atelectasis of both lower lobes. Visualized no pulmonary consolidation, pneumothorax or focal nodule. Visualized upper abdomen and bony structures are unremarkable. IMPRESSION: Pulmonary edema with moderate bilateral pleural effusions. Electronically Signed   By: Marcey Moan M.D.   On: 04/01/2024 14:26   Result Date: 04/01/2024 CLINICAL DATA:  Aortic valve replacement (TAVR), pre-op eval [id number] Severe aortic stenosis [id number] EXAM: Cardiac TAVR CT TECHNIQUE:  The patient was scanned on a Siemens Force 192 slice scanner. A 120 kV retrospective scan was triggered in the descending thoracic aorta at 111 HU's. Gantry rotation speed was 270 msecs and collimation was .9 mm. The 3D data set was reconstructed in 5% intervals of the R-R cycle. Systolic and diastolic phases were analyzed on a dedicated work station using MPR, MIP and VRT modes. The patient received 100mL OMNIPAQUE  IOHEXOL  350 MG/ML SOLN of contrast. 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 Severely thickened and severely calcified aortic valve cusps. AV calcium  score: 1859 Virtual Basal Annulus Measurements: Maximum/Minimum Diameter: 27.5 x 20.2 mm Perimeter: 74.8 mm Area:  423 mm2 No significant LVOT calcifications. Membranous septal length: 8.4 mm Based on these measurements, the annulus would be suitable for a 23 mm Sapien 3 valve. Alternatively, Heart Team  can consider 29 mm Evolut valve. Recommend Heart Team discussion for valve selection. Sinus of Valsalva Measurements: Non-coronary:  35 mm Right - coronary:  34 mm Left - coronary:  34 mm Coronary height and sinus of Valsalva Height: Left main: 22 mm, Left sinus: 26 mm Right coronary: 21 mm, Right sinus: 26 mm Aorta: Common origin of the brachiocephalic and left common carotid artery. Severe aortic atherosclerosis with very severe atheromatous plaque in the infrarenal abdominal aorta. Sinotubular Junction:  31 mm Ascending Thoracic Aorta:  37 mm Aortic Arch:  30 mm Descending Thoracic Aorta:  27 mm Coronary Arteries: Normal coronary origin. Right dominance. The study was performed without use of NTG and insufficient for plaque evaluation. Coronary artery calcium  score not performed due to prior CABG. RIMA-LAD patent. LIMA-OM1 patent. SVG to OM2 appears occluded at the aortic anastomosis. Optimum Fluoroscopic Angle for Delivery: LAO 5 CAU 3 OTHER: Large bilateral pleural effusions. Atria: Biatrial dilation. Left atrial appendage: No thrombus. Mitral valve: Grossly normal, mild mitral annular calcifications. Pulmonary artery: Normal caliber. Pulmonary veins: Normal anatomy. IMPRESSION: 1. 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 Severely thickened and severely calcified aortic valve cusps. 2. Aortic valve calcium  score: 1859 3. Annulus area: 423 mm2, suitable for 23 mm Sapien 3 valve. No LVOT calcifications. Membranous septal length 8.4 mm. 4. Sufficient coronary artery heights from annulus. 5. Optimum fluoroscopic angle for delivery: LAO 5 CAU 3 6. Patent RIMA-LAD, LIMA-OM1.  Occluded SVG to OM2. 7. Severe aortic atherosclerosis with very severe atheromatous plaque in the infrarenal abdominal aorta. 8. Large bilateral pleural effusions. Electronically Signed: By: Soyla Merck M.D. On: 04/01/2024 14:07   ECHOCARDIOGRAM LIMITED Result Date: 03/31/2024    TRANSESOPHOGEAL ECHO REPORT   Patient  Name:   Danny [name] Date of Exam: 03/31/2024 Medical Rec #:  [id number]      Height:       64.0 in Accession #:    [id number]     Weight:       128.5 lb Date of Birth:  01-Oct-1933     BSA:          1.621 m Patient Age:    89 years       BP:           123/66 mmHg Patient Gender: M              HR:           79 bpm. Exam Location:  Inpatient Procedure: Limited Echo, Color Doppler and Cardiac Doppler (Both Spectral and            Color Flow Doppler were  utilized during procedure). Indications:    Aortic Stenosis  History:        Patient has prior history of Echocardiogram examinations, most                 recent 03/26/2024.  Sonographer:    Tinnie Gosling RDCS Referring Phys: [id number] GLENDIA DASEN WEAVER IMPRESSIONS  1. Left ventricular ejection fraction, by estimation, is 20%. The left ventricle has severely decreased function. The left ventricle demonstrates global hypokinesis. The left ventricular internal cavity size was mildly dilated. No LV thrombus noted.  2. Right ventricular systolic function is mildly reduced. The right ventricular size is normal. Tricuspid regurgitation signal is inadequate for assessing PA pressure.  3. Left atrial size was mildly dilated.  4. The mitral valve is degenerative. Moderate mitral valve regurgitation. No evidence of mitral stenosis. Moderate mitral annular calcification.  5. The aortic valve is tricuspid. There is severe calcifcation of the aortic valve. Aortic valve regurgitation is mild. Aortic valve mean gradient measures 19.0 mmHg. Visually, I suspect low flow/low gradient severe aortic stenosis. However, no LVOT VTI  measurement was made, unable to calculate aortic valve area. Would suggest repeat study with fully interrogation of the aortic valve.  6. The inferior vena cava is normal in size with <50% respiratory variability, suggesting right atrial pressure of 8 mmHg.  7. Left pleural effusion present. FINDINGS  Left Ventricle: Left ventricular ejection fraction, by estimation,  is 20%. The left ventricle has severely decreased function. The left ventricle demonstrates global hypokinesis. The left ventricular internal cavity size was mildly dilated. Right Ventricle: The right ventricular size is normal. No increase in right ventricular wall thickness. Right ventricular systolic function is mildly reduced. Tricuspid regurgitation signal is inadequate for assessing PA pressure. Left Atrium: Left atrial size was mildly dilated. Right Atrium: Right atrial size was normal in size. Pericardium: Left pleural effusion present. There is no evidence of pericardial effusion. Mitral Valve: The mitral valve is degenerative in appearance. There is mild calcification of the mitral valve leaflet(s). Moderate mitral annular calcification. Moderate mitral valve regurgitation. No evidence of mitral valve stenosis. Tricuspid Valve: The tricuspid valve is not assessed. Aortic Valve: The aortic valve is tricuspid. There is severe calcifcation of the aortic valve. Aortic valve regurgitation is mild. Aortic valve mean gradient measures 19.0 mmHg. Aortic valve peak gradient measures 32.7 mmHg. Pulmonic Valve: The pulmonic valve was not assessed. Aorta: The aortic root is normal in size and structure. Venous: The inferior vena cava is normal in size with less than 50% respiratory variability, suggesting right atrial pressure of 8 mmHg. IAS/Shunts: No atrial level shunt detected by color flow Doppler.  LEFT VENTRICLE PLAX 2D LVIDd:         5.30 cm LVIDs:         4.80 cm LV PW:         0.90 cm LV IVS:        0.90 cm LVOT diam:     2.10 cm LVOT Area:     3.46 cm  LV Volumes (MOD) LV vol d, MOD A2C: 162.0 ml LV vol d, MOD A4C: 160.0 ml LV vol s, MOD A2C: 132.0 ml LV vol s, MOD A4C: 124.0 ml LV SV MOD A2C:     30.0 ml LV SV MOD A4C:     160.0 ml LV SV MOD BP:      34.3 ml IVC IVC diam: 1.80 cm LEFT ATRIUM         Index LA  diam:    4.10 cm 2.53 cm/m  AORTIC VALVE AV Vmax:      286.00 cm/s AV Vmean:     206.000 cm/s AV  VTI:       0.549 m AV Peak Grad: 32.7 mmHg AV Mean Grad: 19.0 mmHg  AORTA Ao Root diam: 3.50 cm MITRAL VALVE MV Area (PHT): 6.12 cm    SHUNTS MV Decel Time: 124 msec    Systemic Diam: 2.10 cm MV E velocity: 92.00 cm/s MV A velocity: 70.40 cm/s MV E/A ratio:  1.31 Dalton McleanMD Electronically signed by Ezra Kanner Signature Date/Time: 03/31/2024/9:04:33 PM    Final    ECHOCARDIOGRAM COMPLETE Result Date: 03/26/2024    ECHOCARDIOGRAM REPORT   Patient Name:   Danny [name] Date of Exam: 03/26/2024 Medical Rec #:  [id number]      Height:       64.0 in Accession #:    [id number]     Weight:       128.1 lb Date of Birth:  1933/07/29     BSA:          1.619 m Patient Age:    89 years       BP:           116/64 mmHg Patient Gender: M              HR:           81 bpm. Exam Location:  Inpatient Procedure: 2D Echo and Intracardiac Opacification Agent (Both Spectral and Color            Flow Doppler were utilized during procedure). Indications:    CHF  History:        Patient has prior history of Echocardiogram examinations. CHF,                 CAD, Prior CABG; Aortic Valve Disease and Mitral Valve Disease.  Sonographer:    Norleen Amour Referring Phys: MAXIMINO [name] SMITH IMPRESSIONS  1. Left ventricular ejection fraction, by estimation, is <20%. The left ventricle has severely decreased function. The left ventricle demonstrates global hypokinesis. Left ventricular diastolic parameters are consistent with Grade II diastolic dysfunction (pseudonormalization). Elevated left ventricular end-diastolic pressure.  2. Right ventricular systolic function is moderately reduced. The right ventricular size is normal. There is moderately elevated pulmonary artery systolic pressure.  3. Left atrial size was severely dilated.  4. Right atrial size was moderately dilated.  5. Moderate pleural effusion in the left lateral region.  6. The mitral valve is degenerative. Severe mitral valve regurgitation. No evidence of mitral stenosis.   7. Tricuspid valve regurgitation is moderate to severe.  8. Low flow, low gradient severe aortic stenosis. Dimensionless index 0.22. Aortic regurgitation pressure half-time 351 ms. The aortic valve is calcified. There is severe calcifcation of the aortic valve. There is severe thickening of the aortic valve. Aortic valve regurgitation is moderate. Severe aortic valve stenosis. Aortic valve area, by VTI measures 0.77 cm. Aortic valve mean gradient measures 17.0 mmHg. Aortic valve Vmax measures 2.85 m/s.  9. Pulmonic valve regurgitation is moderate. 10. The inferior vena cava is normal in size with <50% respiratory variability, suggesting right atrial pressure of 8 mmHg. FINDINGS  Left Ventricle: Left ventricular ejection fraction, by estimation, is <20%. The left ventricle has severely decreased function. The left ventricle demonstrates global hypokinesis. The left ventricular internal cavity size was normal in size. There is no  left ventricular hypertrophy. Left ventricular diastolic parameters are  consistent with Grade II diastolic dysfunction (pseudonormalization). Elevated left ventricular end-diastolic pressure. Right Ventricle: The right ventricular size is normal. No increase in right ventricular wall thickness. Right ventricular systolic function is moderately reduced. There is moderately elevated pulmonary artery systolic pressure. The tricuspid regurgitant velocity is 3.05 m/s, and with an assumed right atrial pressure of 8 mmHg, the estimated right ventricular systolic pressure is 45.2 mmHg. Left Atrium: Left atrial size was severely dilated. Right Atrium: Right atrial size was moderately dilated. Pericardium: There is no evidence of pericardial effusion. Mitral Valve: The mitral valve is degenerative in appearance. Severe mitral valve regurgitation. No evidence of mitral valve stenosis. Tricuspid Valve: The tricuspid valve is normal in structure. Tricuspid valve regurgitation is moderate to severe. No  evidence of tricuspid stenosis. Aortic Valve: Low flow, low gradient severe aortic stenosis. Dimensionless index 0.22. Aortic regurgitation pressure half-time 351 ms. The aortic valve is calcified. There is severe calcifcation of the aortic valve. There is severe thickening of the aortic valve. Aortic valve regurgitation is moderate. Severe aortic stenosis is present. Aortic valve mean gradient measures 17.0 mmHg. Aortic valve peak gradient measures 32.6 mmHg. Aortic valve area, by VTI measures 0.77 cm. Pulmonic Valve: The pulmonic valve was normal in structure. Pulmonic valve regurgitation is moderate. No evidence of pulmonic stenosis. Aorta: The aortic root is normal in size and structure. Venous: The inferior vena cava is normal in size with less than 50% respiratory variability, suggesting right atrial pressure of 8 mmHg. IAS/Shunts: No atrial level shunt detected by color flow Doppler. Additional Comments: There is a moderate pleural effusion in the left lateral region.  LEFT VENTRICLE PLAX 2D LVIDd:         4.70 cm      Diastology LVIDs:         4.30 cm      LV e' medial:    3.81 cm/s LV PW:         1.00 cm      LV E/e' medial:  20.7 LV IVS:        1.00 cm      LV e' lateral:   10.30 cm/s LVOT diam:     2.10 cm      LV E/e' lateral: 7.6 LV SV:         39 LV SV Index:   24 LVOT Area:     3.46 cm  LV Volumes (MOD) LV vol d, MOD A2C: 197.0 ml LV vol d, MOD A4C: 148.0 ml LV vol s, MOD A2C: 132.0 ml LV vol s, MOD A4C: 128.0 ml LV SV MOD A2C:     65.0 ml LV SV MOD A4C:     148.0 ml LV SV MOD BP:      40.1 ml RIGHT VENTRICLE             IVC RV Basal diam:  4.00 cm     IVC diam: 1.50 cm RV S prime:     10.10 cm/s TAPSE (M-mode): 1.3 cm LEFT ATRIUM             Index        RIGHT ATRIUM           Index LA diam:        3.50 cm 2.16 cm/m   RA Area:     19.70 cm LA Vol (A2C):   65.3 ml 40.34 ml/m  RA Volume:   62.30 ml  38.48 ml/m LA Vol (A4C):   63.6 ml  39.29 ml/m LA Biplane Vol: 64.7 ml 39.97 ml/m  AORTIC VALVE  AV Area (Vmax):    0.75 cm AV Area (Vmean):   0.71 cm AV Area (VTI):     0.77 cm AV Vmax:           285.40 cm/s AV Vmean:          190.000 cm/s AV VTI:            0.503 m AV Peak Grad:      32.6 mmHg AV Mean Grad:      17.0 mmHg LVOT Vmax:         61.80 cm/s LVOT Vmean:        38.967 cm/s LVOT VTI:          0.111 m LVOT/AV VTI ratio: 0.22  AORTA Ao Root diam: 2.80 cm Ao Asc diam:  2.40 cm MITRAL VALVE               TRICUSPID VALVE MV Area (PHT): 5.16 cm    TR Peak grad:   37.2 mmHg MV Decel Time: 147 msec    TR Vmax:        305.00 cm/s MV E velocity: 78.70 cm/s MV A velocity: 58.10 cm/s  SHUNTS MV E/A ratio:  1.35        Systemic VTI:  0.11 m                            Systemic Diam: 2.10 cm Annabella Scarce MD Electronically signed by Annabella Scarce MD Signature Date/Time: 03/26/2024/7:32:52 PM    Final    US  EKG SITE RITE Result Date: 03/26/2024 If Site Rite image not attached, placement could not be confirmed due to current cardiac rhythm.  US  Abdomen Complete Result Date: 03/26/2024 EXAM: COMPLETE ABDOMINAL ULTRASOUND TECHNIQUE: Real-time ultrasonography of the abdomen was performed. COMPARISON: CT of the abdomen and pelvis dated 11/28/2000 and abdominal sonogram dated 10/27/1999. CLINICAL HISTORY: Acute kidney injury (HCC), Transaminitis, Serum lipase elevation. FINDINGS: LIVER: The liver is diffusely heterogeneously echogenic. BILIARY SYSTEM: There is mild biliary sludge. There is thickening of the gallbladder wall, which measures 4.5 mm in thickness. There is a cholesterol polyp demonstrated. The patient is not focally tender over the gallbladder. The common bile duct measures 4 mm in diameter. KIDNEYS: The right kidney measures 8.8 cm in length and contains a simple cyst measuring approximately 1.5 cm. The left kidney measures 10.6 cm in length and contains a complex cyst measuring approximately 4.9 x 3.1 x 4.6 cm. There is a calcified rim, as noted on the previous CT. PANCREAS: The pancreatic  duct measures 2 mm in diameter, within normal limits. SPLEEN: No acute abnormality. Spleen is within normal limits in size. VESSELS: The abdominal aorta is normal in caliber measuring 2 cm in diameter, but demonstrates moderate diffuse calcific atheromatous disease. The inferior vena cava is normal in caliber. OTHER: There are bilateral pleural effusions present. IMPRESSION: 1. Mild biliary sludge and gallbladder wall thickening (4.5 mm) with cholesterol polyp. No focal tenderness over the gallbladder. 2. Bilateral pleural effusions. Electronically signed by: Evalene Coho MD 03/26/2024 05:48 AM EDT RP Workstation: [id number]   DG Chest Portable 1 View Result Date: 03/25/2024 CLINICAL DATA:  SOB EXAM: PORTABLE CHEST 1 VIEW COMPARISON:  Chest x-ray 09/07/2021 FINDINGS: The heart and mediastinal contours are unchanged. Atherosclerotic plaque. Surgical changes noted overlying the mediastinum. Left costophrenic angle collimated off view. No focal consolidation. Mild pulmonary edema.  No pleural effusion. No pneumothorax. No acute osseous abnormality.  Intact sternotomy wires. IMPRESSION: 1. Mild pulmonary edema. 2.  Aortic Atherosclerosis (ICD10-I70.0). 3. Left costophrenic angle collimated off view. Electronically Signed   By: Morgane  Naveau M.D.   On: 03/25/2024 19:29     The total time spent in the appointment was 55 minutes encounter with patients including review of chart and various tests results, discussions about plan of care and coordination of care plan   All questions were answered. The patient knows to call the clinic with any problems, questions or concerns. No barriers to learning was detected.  Thank you for the courtesy of this consultation, Olam JINNY Brunner, NP  9/29/20252:29 PM  Addendum I have seen the patient, examined him. I agree with the assessment and and plan and have edited the notes.   89/M w systolic CHF, aortic stenosis, CAD, s/p CABG in 2023, CKD, and bladder cancer sp  TURP who presented with generalized weakness and fatigue. He was admitted on 03/25/2024. 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 His plt became slightly low in 12/2022, and was 106K on admission 9/16. It gradually got worse since his admission, and dropped to 54K on 9/27 and 35K today, no bleeding. He also has anemia with Hg 7-8.5 range. WBC normal. Smear showed no schistocytes, reticular count is low, no signs of hemolysis.  He is folate, B12, and iron levels are normal.  DIC panel showed elevated D-dimer, normal INR and fibrinogen.  No prior history of hepatitis or liver disease, he does not drink alcohol.  Recent CT scan showed normal appearance of liver and spleen.  I think his thrombocytopenia is likely related to his severe aortic stenosis, and recent TAVR surgery.  No concern for TTP, ITP cannot be ruled out, but felt to be less likely.  Patient has minimal nose bleeding.  Will consider platelet transfusion if platelet drops below 20K.  Antiplatelet therapy is contraindicated given his significant thrombocytopenia.  If antiplatelet therapy is important due to his cardiac history, I can consider TPO to improve his thrombocytopenia.  I will follow-up, thanks for the consultation.  Onita Mattock MD 04/07/2024

## 2024-04-07 NOTE — Progress Notes (Addendum)
 Patient ID: Danny [name], male   DOB: 08-13-1933, 89 y.o.   MRN: [id number]  Progress Note  Patient Name: Danny [name] Date of Encounter: 04/07/2024  Primary Cardiologist: [name]  Subjective   Breathing is much better since TAVR.  Denies dyspnea. No CP   Co-ox 69%, CVP 5   Hgb 7.9 => 7.7, plts 48 => 37=>35   On ceftriaxone  for possible UTI.   Inpatient Medications    Scheduled Meds:  Chlorhexidine  Gluconate Cloth  6 each Topical Daily   dapagliflozin propanediol  10 mg Oral Daily   Influenza vac split trivalent PF  0.5 mL Intramuscular Tomorrow-1000   melatonin  3 mg Oral QHS   rosuvastatin   10 mg Oral Daily   sodium chloride  flush  3 mL Intravenous Q12H   sodium chloride  flush  3 mL Intravenous Q12H   spironolactone  12.5 mg Oral Daily   Continuous Infusions:  sodium chloride      cefTRIAXone  (ROCEPHIN )  IV Stopped (04/06/24 1511)   PRN Meds: sodium chloride , acetaminophen  **OR** acetaminophen , guaiFENesin -dextromethorphan , lip balm, morphine  injection, ondansetron  (ZOFRAN ) IV, mouth rinse, oxyCODONE , senna, sodium chloride  flush, sodium chloride  flush, traMADol , trimethobenzamide    Vital Signs    Vitals:   04/07/24 0000 04/07/24 0400 04/07/24 0621 04/07/24 0753  BP: 105/60 112/68  132/64  Pulse: 73 77 69 68  Resp: 15 12 13 12   Temp: 98.4 F (36.9 C) 98.1 F (36.7 C)  98.1 F (36.7 C)  TempSrc: Oral Oral  Oral  SpO2: 95% 97% 94% 97%  Weight:   56.2 kg   Height:        Intake/Output Summary (Last 24 hours) at 04/07/2024 0848 Last data filed at 04/07/2024 0600 Gross per 24 hour  Intake 100 ml  Output 1150 ml  Net -1050 ml      04/07/2024    6:21 AM 04/06/2024    3:25 AM 04/05/2024    3:55 AM  Last 3 Weights  Weight (lbs) 123 lb 14.4 oz 121 lb 4.1 oz 121 lb 4.1 oz  Weight (kg) 56.2 kg 55 kg 55 kg     Telemetry    NSR with PACs 80s, personally reviewed  Physical Exam  CVP 4-5  GENERAL: elderly male, NAD Lungs- clear CARDIAC:  JVP: 5 cm          Normal rate with regular rhythm. 1/6 SEM murmur.  No LEE .  ABDOMEN: Soft, non-tender, non-distended.  EXTREMITIES: Warm and well perfused.  NEUROLOGIC: No obvious FND   Labs    High Sensitivity Troponin:   Recent Labs  Lab 03/26/24 0404  TROPONINIHS 484*      Cardiac EnzymesNo results for input(s): TROPONINI in the last 168 hours. No results for input(s): TROPIPOC in the last 168 hours.   Chemistry Recent Labs  Lab 04/01/24 0650 04/02/24 0500 04/03/24 0434 04/04/24 0404 04/05/24 0545 04/06/24 0251 04/07/24 0515  NA 130*   < > 130*   < > 129* 130* 130*  K 5.0   < > 4.1   < > 4.4 4.4 4.5  CL 98   < > 97*   < > 98 100 101  CO2 22   < > 26   < > 23 22 23   GLUCOSE 102*   < > 90   < > 94 100* 90  BUN 58*   < > 60*   < > 40* 37* 33*  CREATININE 1.68*   < > 1.87*   < >  1.63* 1.32* 1.35*  CALCIUM  8.4*   < > 7.9*   < > 7.7* 7.8* 7.8*  PROT 5.9*  --  5.2*  --   --  5.0*  --   ALBUMIN  2.6*  --  2.3*  --   --  2.2*  --   AST 38  --  26  --   --  19  --   ALT 86*  --  50*  --   --  9  --   ALKPHOS 83  --  71  --   --  60  --   BILITOT 1.1  --  1.1  --   --  0.7  --   GFRNONAA 39*   < > 34*   < > 41* 52* 51*  ANIONGAP 10   < > 7   < > 8 8 6    < > = values in this interval not displayed.     Hematology Recent Labs  Lab 04/05/24 0545 04/06/24 0251 04/07/24 0515  WBC 6.5 6.5 7.2  RBC 2.29* 2.23* 2.23*  HGB 7.9* 7.7* 7.7*  HCT 23.0* 22.3* 22.4*  MCV 100.4* 100.0 100.4*  MCH 34.5* 34.5* 34.5*  MCHC 34.3 34.5 34.4  RDW 20.2* 19.5* 19.7*  PLT 48* 37* 35*    BNPNo results for input(s): BNP, PROBNP in the last 168 hours.   DDimer No results for input(s): DDIMER in the last 168 hours.   Radiology    ECHOCARDIOGRAM COMPLETE Result Date: 04/05/2024    ECHOCARDIOGRAM REPORT   Patient Name:   Danny [name] Date of Exam: 04/05/2024 Medical Rec #:  [id number]      Height:       64.0 in Accession #:    [id number]     Weight:       121.3 lb Date of Birth:   1933-08-30     BSA:          1.582 m Patient Age:    89 years       BP:           122/70 mmHg Patient Gender: M              HR:           77 bpm. Exam Location:  Inpatient Procedure: 2D Echo, 3D Echo, Cardiac Doppler, Color Doppler and Strain Analysis            (Both Spectral and Color Flow Doppler were utilized during            procedure). Indications:    Post TAVR evaluation V43.3 / Z95.2  History:        Patient has prior history of Echocardiogram examinations, most                 recent 04/04/2024. CHF, CAD and Previous Myocardial Infarction,                 Prior CABG, Arrythmias:Bradycardia; Risk Factors:Hypertension                 and Dyslipidemia. Chronic kidney disease, stage 3a (HCC), S/P                 TAVR (transcatheter aortic valve replacement).                 Aortic Valve: 23 mm Sapien prosthetic, stented (TAVR) valve is  present in the aortic position. Procedure Date: 04/04/24.  Sonographer:    Aida Pizza RCS Referring Phys: [id number] [facility] R THOMPSON  Sonographer Comments: Global longitudinal strain was attempted. IMPRESSIONS  1. S/P TAVR with mean gradient 10 mmHg and trace AI.  2. Left ventricular ejection fraction, by estimation, is 35 to 40%. The left ventricle has moderately decreased function. The left ventricle demonstrates global hypokinesis. Left ventricular diastolic parameters are consistent with Grade II diastolic dysfunction (pseudonormalization). The average left ventricular global longitudinal strain is -8.7 %. The global longitudinal strain is abnormal.  3. Right ventricular systolic function is normal. The right ventricular size is normal.  4. Left atrial size was severely dilated.  5. Right atrial size was severely dilated.  6. Moderate pleural effusion in the left lateral region.  7. The mitral valve is normal in structure. Mild mitral valve regurgitation. No evidence of mitral stenosis.  8. The aortic valve has been repaired/replaced. Aortic valve  regurgitation is trivial. No aortic stenosis is present. There is a 23 mm Sapien prosthetic (TAVR) valve present in the aortic position. Procedure Date: 04/04/24.  9. The inferior vena cava is normal in size with greater than 50% respiratory variability, suggesting right atrial pressure of 3 mmHg. FINDINGS  Left Ventricle: Left ventricular ejection fraction, by estimation, is 35 to 40%. The left ventricle has moderately decreased function. The left ventricle demonstrates global hypokinesis. The average left ventricular global longitudinal strain is -8.7 %.  Strain was performed and the global longitudinal strain is abnormal. The left ventricular internal cavity size was normal in size. There is no left ventricular hypertrophy. Left ventricular diastolic parameters are consistent with Grade II diastolic dysfunction (pseudonormalization). Right Ventricle: The right ventricular size is normal. Right ventricular systolic function is normal. Left Atrium: Left atrial size was severely dilated. Right Atrium: Right atrial size was severely dilated. Pericardium: There is no evidence of pericardial effusion. Mitral Valve: The mitral valve is normal in structure. Mild mitral annular calcification. Mild mitral valve regurgitation. No evidence of mitral valve stenosis. Tricuspid Valve: The tricuspid valve is normal in structure. Tricuspid valve regurgitation is mild . No evidence of tricuspid stenosis. Aortic Valve: The aortic valve has been repaired/replaced. Aortic valve regurgitation is trivial. No aortic stenosis is present. Aortic valve mean gradient measures 10.5 mmHg. Aortic valve peak gradient measures 20.3 mmHg. Aortic valve area, by VTI measures 1.43 cm. There is a 23 mm Sapien prosthetic, stented (TAVR) valve present in the aortic position. Procedure Date: 04/04/24. Pulmonic Valve: The pulmonic valve was normal in structure. Pulmonic valve regurgitation is not visualized. No evidence of pulmonic stenosis. Aorta: The  aortic root is normal in size and structure. Venous: The inferior vena cava is normal in size with greater than 50% respiratory variability, suggesting right atrial pressure of 3 mmHg. IAS/Shunts: No atrial level shunt detected by color flow Doppler. Additional Comments: S/P TAVR with mean gradient 10 mmHg and trace AI. 3D was performed not requiring image post processing on an independent workstation and was abnormal. There is a moderate pleural effusion in the left lateral region.  LEFT VENTRICLE PLAX 2D LVIDd:         5.00 cm   Diastology LVIDs:         3.80 cm   LV e' medial:    4.79 cm/s LV PW:         0.90 cm   LV E/e' medial:  22.3 LV IVS:        1.00 cm  LV e' lateral:   10.00 cm/s LVOT diam:     2.00 cm   LV E/e' lateral: 10.7 LV SV:         62 LV SV Index:   39        2D Longitudinal Strain LVOT Area:     3.14 cm  2D Strain GLS Avg:     -8.7 %                           3D Volume EF:                          3D EF:        44 %                          LV EDV:       173 ml                          LV ESV:       97 ml                          LV SV:        76 ml RIGHT VENTRICLE RV S prime:     13.50 cm/s TAPSE (M-mode): 1.8 cm LEFT ATRIUM             Index        RIGHT ATRIUM           Index LA diam:        4.10 cm 2.59 cm/m   RA Area:     23.80 cm LA Vol (A2C):   69.0 ml 43.63 ml/m  RA Volume:   81.30 ml  51.41 ml/m LA Vol (A4C):   76.1 ml 48.12 ml/m LA Biplane Vol: 75.6 ml 47.80 ml/m  AORTIC VALVE AV Area (Vmax):    1.49 cm AV Area (Vmean):   1.44 cm AV Area (VTI):     1.43 cm AV Vmax:           225.50 cm/s AV Vmean:          149.000 cm/s AV VTI:            0.438 m AV Peak Grad:      20.3 mmHg AV Mean Grad:      10.5 mmHg LVOT Vmax:         107.00 cm/s LVOT Vmean:        68.350 cm/s LVOT VTI:          0.198 m LVOT/AV VTI ratio: 0.45  AORTA Ao Root diam: 3.30 cm MITRAL VALVE MV Area (PHT): 4.60 cm     SHUNTS MV Decel Time: 165 msec     Systemic VTI:  0.20 m MV E velocity: 107.00 cm/s  Systemic  Diam: 2.00 cm MV A velocity: 86.40 cm/s MV E/A ratio:  1.24 Redell Shallow MD Electronically signed by Redell Shallow MD Signature Date/Time: 04/05/2024/1:03:26 PM    Final     Cardiac Studies   2d echo 04/05/24   1. S/P TAVR with mean gradient 10 mmHg and trace AI.   2. Left ventricular ejection fraction, by estimation, is 35 to 40%. The  left ventricle has moderately decreased function. The left ventricle  demonstrates global hypokinesis. Left ventricular  diastolic parameters are  consistent with Grade II diastolic  dysfunction (pseudonormalization). The average left ventricular global  longitudinal strain is -8.7 %. The global longitudinal strain is abnormal.   3. Right ventricular systolic function is normal. The right ventricular  size is normal.   4. Left atrial size was severely dilated.   5. Right atrial size was severely dilated.   6. Moderate pleural effusion in the left lateral region.   7. The mitral valve is normal in structure. Mild mitral valve  regurgitation. No evidence of mitral stenosis.   8. The aortic valve has been repaired/replaced. Aortic valve  regurgitation is trivial. No aortic stenosis is present. There is a 23 mm  Sapien prosthetic (TAVR) valve present in the aortic position. Procedure  Date: 04/04/24.   9. The inferior vena cava is normal in size with greater than 50%  respiratory variability, suggesting right atrial pressure of 3 mmHg.   Patient Profile     89 y.o. male with CAD w/ prior NSTEMI 07/2021 s/p CABG (RIMA to LAD, LIMA to OM1, SVG to OM2), ischemic cardiomyopathy, hx CVA, PAD, bladder cancer, CKD IIIa, LBBB and aortic valve stenosis. He was admitted for evaluation and management of a/c CHF, acute on chronic anemia, AKI on CKD IIIb, elevated LFTs, pleural effusions requiring thoracentesis. 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 Structural/AHF team have assisted with  care. EF found to be <20%, down from prior 50-55% in 12/2023. Underwent transfemoral TAVR 04/04/24.  Assessment & Plan    1. Acute systolic CHF with cardiogenic shock, pleural effusions requiring thoracentesis - 12/2023 EF 50-55% w/ reported moderate AS - 03/26/24 on admission EF <20% with severe AS - 04/05/24 POD1 TAVR EF up to 35-40% and bioprosthetic aortic valve functioning normally - Low EF at admission may be due to combination of severe AS and LBBB.   - Co-ox 69%, CVP 4-5  - Does not need loop diuretic  - On Farxiga 10 mg daily, Switching to Jardiance d/t cost   - Increase spironolactone to 25 mg daily   2. Severe AS - s/p TAVR 04/04/24 - POD1 echo with EF up to 35-40% and bioprosthetic aortic valve functioning normally - no antiplatelets currently in setting of anemia/thrombocytopenia  3. CAD - Elevated troponin - NSTEMI 2023 s/p CABG X 3 - per notes cardiac CT demonstrated patent RIMA-LAD and LIMA-OM1 as well as an occluded SVG-->OM which was not felt to explain his cardiomyopathy, angiography deferred - HS troponin 484, suspected demand ischemia - Statin initially held due to shock liver, back on rosuvastatin  10mg  daily since 03/28/24 - No aspirin  with significant anemia and thrombocytopenia.    4. Acute on chronic macrocytic anemia - No overt GI bleeding.  - IM also raising question of possible myeloproliferative disorder given prior trends - Hgb 7.9 => 7.7, transfuse < 7.    5. AKI on CKD IIIa - Baseline Scr 1.2-1.3, peaked 2.7 in setting of shock, 1.63 => 1.35 today  6. Elevated LFTs - Suspect shock liver, LFTs have trended down - US  abdomen with biliary sludge and gallbladder wall thickening  7. Question of Afib this admission - EKGs have shown NSR/SB with LBBB, will need to follow conduction as OP - currently NSR with occasional PACs, rare PVCs - TSH OK - Not a great candidate for [facility] given anemia/thrombocytopenia.   8. Thrombocytopenia - Platelets continue to  fall, down to 35 today.   - Not on any  form of heparin  at this time.  - recommend hematology consult.   Caffie Shed, PA-C  04/07/2024  Patient seen with PA, I formulated the plan and agree with the above note.   No complaints this morning.  CVP 5, co-ox 69%.  Creatinine stable 1.35.   Hgb 7.7, plts mildly lower at 37.   General: NAD Neck: No JVD, no thyromegaly or thyroid  nodule.  Lungs: Clear to auscultation bilaterally with normal respiratory effort. CV: Nondisplaced PMI.  Heart regular S1/S2, no S3/S4, 1/6 SEM RUSB.  No peripheral edema.    Abdomen: Soft, nontender, no hepatosplenomegaly, no distention.  Skin: Intact without lesions or rashes.  Neurologic: Alert and oriented x 3.  Psych: Normal affect. Extremities: No clubbing or cyanosis.  HEENT: Normal.   Euvolemic, stable from cardiac standpoint.  Can increase spironolactone to 25 mg daily and add Coreg 3.125 mg bid.   Plts slightly lower, would be reasonable at this point to get hematology consult.  Ezra Shuck 04/07/2024 9:45 AM

## 2024-04-07 NOTE — Progress Notes (Signed)
 Daily Progress Note   Date: 04/07/2024   Patient Name: Danny [name]  DOB: 05/19/1934  MRN: [id number]  Age / Sex: 89 y.o., male  Attending Physician: Fairy Frames, MD Primary Care Physician: [name] LABOR, MD Admit Date: 03/25/2024 Length of Stay: 12 days  Reason for Follow-up: Establishing goals of care  Past Medical History:  Diagnosis Date   (HFpEF) heart failure with preserved ejection fraction (HCC)    Echo 1/23: Inferior AK, mild aortic stenosis (mean 9 mmHg, V-max 210.5 cm/s, DI 0.70), EF 55-60, GR 1 DD, normal RVSF, normal PASP, trivial MR, mild AI, borderline dilation of aortic root (39 mm)   Acid reflux    hiatal hernia   Anemia    Bladder cancer (HCC)    CAD (coronary artery disease)    S/p non-STEMI 1/23 >> s/p CABG   Carotid artery disease 08/23/2021   Pre-CABG Dopplers 1/23:R ICA 40-59; L ICA 1-39 // Carotid US  07/20/2023: RICA 40-59; LICA 1-39   CHF (congestive heart failure) (HCC)    Chronic kidney disease (CKD)    Coronary artery disease involving native coronary artery of native heart without angina pectoris 05/16/2022   Inf NSTEMI s/p CABG in 07/2021 (RIMA-LAD, LIMA-OM1, S-OM2)   Diverticulitis    Erectile dysfunction 04/20/2015   Essential hypertension 04/20/2015   History of colon polyps    History of kidney stones    History of stroke    Noted on brain MRI 07/2021   Hyperlipidemia 05/28/2015   PAD (peripheral artery disease)    Pre-CABG Dopplers 1/23: Right ABI 0.65; left ABI 0.82   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 Wendel and Dr. Aneita   Skin cancer    Stroke [facility])    noted MRI 2023 pt. unaware    Subjective:   Subjective: Chart Reviewed. Updates received. Patient Assessed. Created space and opportunity for patient  and family to explore thoughts and feelings regarding current medical situation.  Today's Discussion: Today before meeting  with the patient/family, I reviewed the chart notes including hospitalist note from yesterday, cardiology note from yesterday, nursing note from yesterday, ability note from yesterday, advanced heart failure team note from today.  I also reviewed vital signs, nursing flowsheets, medication administrations record, labs, and imaging. Labs reviewed include CBC today which shows platelet count of 35 in the setting of progressive thrombocytopenia of unknown etiology or significance, likely consulting hematology today. CoOx panel from today which shows O2 saturation 68.9 after TAVR.  Today saw the patient at bedside, no family was present.  Overall he looks much better.  He states that he feels really good after TAVR.  He celebrates being up in the hall yesterday walking around.  We reminisced over the past almost 2 weeks of care and everything that he has accomplished.  He is hopeful that with this TAVR he will be able to get home to his wife and daughter.  We talked about the last remaining hurdle is platelet count and he understands the need to clear this up prior to going home.  He wants to go home and his best.  We talked about possible hematology consult and he is accepting and celebrating of this.  I shared that given his remarkable improvement after TAVR I would back off for a couple days and plan to return on Thursday if he is still admitted.  We both agreed and hoped that  he would be able to discharge prior to that.  I provided emotional and general support through therapeutic listening, empathy, sharing of stories, and other techniques. I answered all questions and addressed all concerns to the best of my ability.  Review of Systems  Constitutional:        Overall feels much better after TAVR  Respiratory:  Negative for chest tightness and shortness of breath.   Cardiovascular:  Negative for chest pain.  Gastrointestinal:  Negative for abdominal pain, nausea and vomiting.    Objective:    Primary Diagnoses: Present on Admission:  Acute on chronic systolic CHF (congestive heart failure) (HCC)  Macrocytic anemia  Hyperlipidemia  Essential hypertension  Chronic kidney disease, stage 3a (HCC)  PAD (peripheral artery disease)   Vital Signs:  BP 132/64 (BP Location: Right Arm)   Pulse 68   Temp 98.1 F (36.7 C) (Oral)   Resp 12   Ht 5' 4 (1.626 m)   Wt 56.2 kg   SpO2 97%   BMI 21.27 kg/m   Physical Exam Vitals and nursing note reviewed.  Constitutional:      General: He is not in acute distress.    Appearance: He is ill-appearing. He is not toxic-appearing.     Comments: Appears somewhat weak  HENT:     Head: Normocephalic and atraumatic.  Cardiovascular:     Rate and Rhythm: Normal rate.  Pulmonary:     Effort: Pulmonary effort is normal. No respiratory distress.     Breath sounds: No wheezing or rhonchi.  Abdominal:     General: Abdomen is flat. Bowel sounds are normal. There is no distension.     Palpations: Abdomen is soft.  Skin:    General: Skin is warm and dry.  Neurological:     General: No focal deficit present.     Mental Status: He is alert and oriented to person, place, and time.  Psychiatric:        Mood and Affect: Mood normal.        Behavior: Behavior normal.     Palliative Assessment/Data: 60-70%   Assessment & Plan:   HPI/Patient Profile:  89 y.o. male  with past medical history of heart failure with preserved ejection fraction, hyperlipidemia, CAD s/p CABG in 2023, CKD, and recurrent bladder cancer s/p TURP who presented with complaints of generalized weakness and fatigue over the last 5 days.  He was admitted on 03/25/2024 with HFpEF, elevated troponin macrocytic anemia, AKI superimposed on CKD 3B, transaminitis papillary bladder tumor, and others.  After workup determined to be in acute systolic heart failure with EF less than 20%, severe aortic stenosis, cardiogenic shock, lactic acidosis, AKI, acute liver failure, LBBB, and  others.   Palliative medicine was consulted for GOC conversations.  SUMMARY OF RECOMMENDATIONS   DNR-limited Continue full scope of care otherwise Time to trend platelet count, possible hematology consult per heart failure team Continued support of patient and family Palliative medicine will follow-up in 3 days if patient remains admitted Please notify us  of any significant change or new palliative needs in the interim  Symptom Management:  Per primary team Palliative medicine is available to assist as needed  Code Status: DNR - Limited (DNR/DNI)  Prognosis: Unable to determine  Discharge Planning: To Be Determined  Discussed with: Patient, medical team, nursing team  Thank you for allowing us  to participate in the care of Shell R Kenney PMT will continue to support holistically.  Billing based on MDM: Moderate  Detailed review of medical records (labs, imaging, vital signs), medically appropriate exam, discussed with treatment team, counseling and education to patient, family, & staff, documenting clinical information, medication management, coordination of care  Camellia Kays, NP Palliative Medicine Team  Team Phone # [phone] (Nights/Weekends)  03/08/2021, 8:17 AM

## 2024-04-07 NOTE — Progress Notes (Addendum)
 PROGRESS NOTE    Danny [name]  FMW:[id number] DOB: 1934/03/25 DOA: 03/25/2024 PCP: [name] LABOR, MD  89/M w systolic CHF, aortic stenosis, CAD, CKD, and bladder cancer sp TURP who presented with generalized weakness and fatigue.  In ED, vol overloaded, cr 2.2 , troponin 375 and 360 , Lactic acid 2.2 , hgb 7.2  Echo with reduced EF,  with signs of hypoperfusion.  Placed on milrinone  for inotropic support.  -9/19 off milrinone . Consulted structural heart team.  -9/22 limited echocardiogram with sever low flow gradient aortic stenosis.  - 9/23 right and left thoracentesis, 1.3 L drained on left and 1.4 on right - 9/24, structural heart team consult, plan for TAVR  -9/26: underwent TAVR -9/27: Platelet count worsening  Subjective: Okay, no complaints, denies dyspnea today  Assessment and Plan:  Acute on chronic systolic CHF Severe MR, TR Severe LFLG Aortic stenosis -Echo w EF < 20%, RV with mod reduced, severe mitral valve regurgitation, moderate to severe tricuspid valve regurgitation, low flow severe aortic valve stenosis with moderate regurgitation, moderate pulmonary valve regurgitation,  - concern for low output on admission, started on milrinone  with diuretics -Weaned off milrinone  on 9/19  - GDMT limited by hypotension, CVP was low, diuretics were on hold -Continue Aldactone and Jardiance - Structural heart team following, underwent TAVR 9/26, repeat echo noted- Palliative care following, DNR - Discharge planning, home soon if platelets start improving  Abnormal LFTs, shock liver Improved  AKI on CKD 3a - Baseline creatinine 1.3, peaked at 2.7 in the setting of cardiogenic shock - Improving, diuretics on hold now  Coronary artery disease History of CABG 2023, at bedtime troponin was 484, felt to be demand ischemia from CHF - Statin held for shock liver, aspirin  held with severe anemia - Restart statin  PAD (peripheral artery disease) Continue statin, aspirin   on hold  Macrocytic anemia Thrombocytopenia.  - Suspect underlying myeloproliferative disorder, mild anemia and thrombocytopenia have been ongoing issues for at least 2 years, worse now - B12 -normal - Indirect bilirubin is normal, hemolysis less likely - transfused 1 unit PRBC last week - Platelets lower, received Heparin  for TAVR 9/26> likely contributing to worsening, monitor,> will request hematology eval  Abnormal Bladder on CT ?  Klebsiella UTI - CT notes, Intraluminal gas within the urinary bladder including scattered punctate foci along the posterior bladder wall. 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 -pt denies any new or concerning symptoms, suspect related to bladder CA -FU Urine CX, given TAVR I started empiric antibiotics, day 4 today, DC ABX now  History of bladder cancer Follow up as outpatient.   DVT prophylaxis: SCDs Code Status: DNR Family Communication: none present Disposition Plan: Home with home health services  Consultants:    Procedures:   Antimicrobials:    Objective: Vitals:   04/07/24 0400 04/07/24 0621 04/07/24 0753 04/07/24 0800  BP: 112/68  132/64 121/67  Pulse: 77 69 68 71  Resp: 12 13 12 14   Temp: 98.1 F (36.7 C)  98.1 F (36.7 C)   TempSrc: Oral  Oral   SpO2: 97% 94% 97% 96%  Weight:  56.2 kg    Height:        Intake/Output Summary (Last 24 hours) at 04/07/2024 1133 Last data filed at 04/07/2024 1119 Gross per 24 hour  Intake 340 ml  Output 1500 ml  Net -1160 ml   Filed Weights   04/05/24 0355 04/06/24  9674 04/07/24 0621  Weight: 55 kg 55 kg 56.2 kg    Examination:  Gen: Awake, Alert, Oriented X 3, chronically ill-appearing, no distress HEENT: no JVD Lungs: Good air movement bilaterally, CTAB CVS: S1S2/RRR systolic murmur Abd: soft, Non tender, non distended, BS present Extremities: No edema, right arm PICC line Skin:  no new rashes on exposed skin     Data Reviewed:   CBC: Recent Labs  Lab 04/05/24 0150 04/05/24 0545 04/06/24 0251 04/07/24 0515 04/07/24 1015  WBC 7.4 6.5 6.5 7.2 6.4  NEUTROABS  --   --   --   --  4.8  HGB 8.4* 7.9* 7.7* 7.7* 7.8*  HCT 25.0* 23.0* 22.3* 22.4* 23.5*  MCV 100.4* 100.4* 100.0 100.4* 101.3*  PLT 54* 48* 37* 35* 34*  36*   Basic Metabolic Panel: Recent Labs  Lab 04/02/24 0500 04/03/24 0434 04/04/24 0404 04/04/24 0910 04/05/24 0150 04/05/24 0545 04/06/24 0251 04/07/24 0515  NA 131*   < > 131* 131* 128* 129* 130* 130*  K 4.2   < > 4.4 4.4 4.5 4.4 4.4 4.5  CL 99   < > 99 96* 94* 98 100 101  CO2 24   < > 25  --  22 23 22 23   GLUCOSE 125*   < > 100* 111* 101* 94 100* 90  BUN 60*   < > 46* 38* 40* 40* 37* 33*  CREATININE 1.92*   < > 1.50* 1.50* 1.56* 1.63* 1.32* 1.35*  CALCIUM  8.2*   < > 8.2*  --  7.8* 7.7* 7.8* 7.8*  MG 1.8  --   --   --   --   --   --   --    < > = values in this interval not displayed.   GFR: Estimated Creatinine Clearance: 30.6 mL/min (A) (by C-G formula based on SCr of 1.35 mg/dL (H)). Liver Function Tests: Recent Labs  Lab 04/01/24 0650 04/03/24 0434 04/06/24 0251  AST 38 26 19  ALT 86* 50* 9  ALKPHOS 83 71 60  BILITOT 1.1 1.1 0.7  PROT 5.9* 5.2* 5.0*  ALBUMIN  2.6* 2.3* 2.2*   No results for input(s): LIPASE, AMYLASE in the last 168 hours.  No results for input(s): AMMONIA in the last 168 hours. Coagulation Profile: Recent Labs  Lab 04/03/24 2105 04/07/24 1015  INR 1.2 1.2   Cardiac Enzymes: No results for input(s): CKTOTAL, CKMB, CKMBINDEX, TROPONINI in the last 168 hours. BNP (last 3 results) No results for input(s): PROBNP in the last 8760 hours. HbA1C: No results for input(s): HGBA1C in the last 72 hours.  CBG: Recent Labs  Lab 04/07/24 1115  GLUCAP 129*    Lipid Profile: No results for input(s): CHOL, HDL, LDLCALC, TRIG, CHOLHDL, LDLDIRECT in the last 72  hours. Thyroid  Function Tests: No results for input(s): TSH, T4TOTAL, FREET4, T3FREE, THYROIDAB in the last 72 hours. Anemia Panel: Recent Labs    04/07/24 1015  VITAMINB12 469  FOLATE 7.4  FERRITIN 306  TIBC 288  IRON 99  RETICCTPCT 2.1    Urine analysis:    Component Value Date/Time   COLORURINE YELLOW 04/04/2024 0340   APPEARANCEUR CLOUDY (A) 04/04/2024 0340   LABSPEC 1.017 04/04/2024 0340   PHURINE 5.0 04/04/2024 0340   GLUCOSEU NEGATIVE 04/04/2024 0340   HGBUR SMALL (A) 04/04/2024 0340   BILIRUBINUR NEGATIVE 04/04/2024 0340   BILIRUBINUR large (A) 10/20/2022 1608   BILIRUBINUR negative 01/19/2022 0924   KETONESUR NEGATIVE 04/04/2024 0340   PROTEINUR  30 (A) 04/04/2024 0340   UROBILINOGEN 4.0 (A) 10/20/2022 1608   UROBILINOGEN 0.2 11/28/2013 2032   NITRITE NEGATIVE 04/04/2024 0340   LEUKOCYTESUR LARGE (A) 04/04/2024 0340   Sepsis Labs: @LABRCNTIP (procalcitonin:4,lacticidven:4)  ) Recent Results (from the past 240 hours)  Surgical pcr screen     Status: None   Collection Time: 04/03/24  7:51 PM   Specimen: Nasal Mucosa; Nasal Swab  Result Value Ref Range Status   MRSA, PCR NEGATIVE NEGATIVE Final   Staphylococcus aureus NEGATIVE NEGATIVE Final    Comment: (NOTE) The Xpert SA Assay (FDA approved for NASAL specimens in patients 32 years of age and older), is one component of a comprehensive surveillance program. It is not intended to diagnose infection nor to guide or monitor treatment. Performed at [facility], [address] [place], [place] [address]   Urine Culture (for pregnant, neutropenic or urologic patients or patients with an indwelling urinary catheter)     Status: Abnormal   Collection Time: 04/04/24  1:10 PM   Specimen: Urine, Clean Catch  Result Value Ref Range Status   Specimen Description URINE, CLEAN CATCH  Final   Special Requests   Final    NONE Performed at [facility], [address], [place], [place]  [address]    Culture >=100,000 COLONIES/mL KLEBSIELLA OXYTOCA (A)  Final   Report Status 04/07/2024 FINAL  Final   Organism ID, Bacteria KLEBSIELLA OXYTOCA (A)  Final      Susceptibility   Klebsiella oxytoca - MIC*    AMPICILLIN >=32 RESISTANT Resistant     CEFEPIME <=0.12 SENSITIVE Sensitive     ERTAPENEM <=0.12 SENSITIVE Sensitive     CEFTRIAXONE  <=0.25 SENSITIVE Sensitive     CIPROFLOXACIN <=0.06 SENSITIVE Sensitive     GENTAMICIN <=1 SENSITIVE Sensitive     NITROFURANTOIN <=16 SENSITIVE Sensitive     TRIMETH /SULFA  <=20 SENSITIVE Sensitive     AMPICILLIN/SULBACTAM 4 SENSITIVE Sensitive     PIP/TAZO Value in next row Sensitive      <=4 SENSITIVEThis is a modified FDA-approved test that has been validated and its performance characteristics determined by the reporting laboratory.  This laboratory is certified under the Clinical Laboratory Improvement Amendments CLIA as qualified to perform high complexity clinical laboratory testing.    MEROPENEM Value in next row Sensitive      <=4 SENSITIVEThis is a modified FDA-approved test that has been validated and its performance characteristics determined by the reporting laboratory.  This laboratory is certified under the Clinical Laboratory Improvement Amendments CLIA as qualified to perform high complexity clinical laboratory testing.    * >=100,000 COLONIES/mL KLEBSIELLA OXYTOCA     Radiology Studies: No results found.    Scheduled Meds:  Chlorhexidine  Gluconate Cloth  6 each Topical Daily   empagliflozin  10 mg Oral Daily   Influenza vac split trivalent PF  0.5 mL Intramuscular Tomorrow-1000   melatonin  3 mg Oral QHS   rosuvastatin   10 mg Oral Daily   sodium chloride  flush  3 mL Intravenous Q12H   sodium chloride  flush  3 mL Intravenous Q12H   spironolactone  25 mg Oral Daily   Continuous Infusions:  sodium chloride      cefTRIAXone  (ROCEPHIN )  IV Stopped (04/06/24 1511)     LOS: 12 days    Time spent:    Sigurd Pac, MD [facility]   04/07/2024, 11:33 AM

## 2024-04-07 NOTE — Plan of Care (Signed)
   Problem: Education: Goal: Knowledge of General Education information will improve Description: Including pain rating scale, medication(s)/side effects and non-pharmacologic comfort measures Outcome: Progressing   Problem: Clinical Measurements: Goal: Will remain free from infection Outcome: Progressing   Problem: Activity: Goal: Risk for activity intolerance will decrease Outcome: Progressing

## 2024-04-08 DIAGNOSIS — I5022 Chronic systolic (congestive) heart failure: Secondary | ICD-10-CM | POA: Diagnosis not present

## 2024-04-08 LAB — COMPREHENSIVE METABOLIC PANEL WITH GFR
ALT: 10 U/L (ref 0–44)
AST: 25 U/L (ref 15–41)
Albumin: 2.2 g/dL — ABNORMAL LOW (ref 3.5–5.0)
Alkaline Phosphatase: 61 U/L (ref 38–126)
Anion gap: 8 (ref 5–15)
BUN: 28 mg/dL — ABNORMAL HIGH (ref 8–23)
CO2: 23 mmol/L (ref 22–32)
Calcium: 7.8 mg/dL — ABNORMAL LOW (ref 8.9–10.3)
Chloride: 97 mmol/L — ABNORMAL LOW (ref 98–111)
Creatinine, Ser: 1.31 mg/dL — ABNORMAL HIGH (ref 0.61–1.24)
GFR, Estimated: 53 mL/min — ABNORMAL LOW (ref >60)
Glucose, Bld: 153 mg/dL — ABNORMAL HIGH (ref 70–99)
Potassium: 3.9 mmol/L (ref 3.5–5.1)
Sodium: 128 mmol/L — ABNORMAL LOW (ref 135–145)
Total Bilirubin: 0.4 mg/dL (ref 0.0–1.2)
Total Protein: 5 g/dL — ABNORMAL LOW (ref 6.5–8.1)

## 2024-04-08 LAB — GLUCOSE, CAPILLARY
Glucose-Capillary: 88 mg/dL (ref 70–99)
Glucose-Capillary: 85 mg/dL (ref 70–99)
Glucose-Capillary: 110 mg/dL — ABNORMAL HIGH (ref 70–99)
Glucose-Capillary: 143 mg/dL — ABNORMAL HIGH (ref 70–99)

## 2024-04-08 LAB — CBC
HCT: 22.4 % — ABNORMAL LOW (ref 39.0–52.0)
Hemoglobin: 7.5 g/dL — ABNORMAL LOW (ref 13.0–17.0)
MCH: 33.5 pg (ref 26.0–34.0)
MCHC: 33.5 g/dL (ref 30.0–36.0)
MCV: 100 fL (ref 80.0–100.0)
Platelets: 33 K/uL — ABNORMAL LOW (ref 150–400)
RBC: 2.24 MIL/uL — ABNORMAL LOW (ref 4.22–5.81)
RDW: 19.2 % — ABNORMAL HIGH (ref 11.5–15.5)
WBC: 6.8 K/uL (ref 4.0–10.5)
nRBC: 0 % (ref 0.0–0.2)

## 2024-04-08 LAB — COOXEMETRY PANEL
Carboxyhemoglobin: 1.9 % — ABNORMAL HIGH (ref 0.5–1.5)
Methemoglobin: 0.9 % (ref 0.0–1.5)
O2 Saturation: 83.7 %
Total hemoglobin: 8.1 g/dL — ABNORMAL LOW (ref 12.0–16.0)

## 2024-04-08 LAB — HEPARIN INDUCED PLATELET AB (HIT ANTIBODY): Heparin Induced Plt Ab: 0.114 {OD_unit} (ref 0.000–0.400)

## 2024-04-08 MED ORDER — CARVEDILOL 3.125 MG PO TABS
3.1250 mg | ORAL_TABLET | Freq: Two times a day (BID) | ORAL | Status: DC
  Administered 2024-04-08 – 2024-04-10 (×5): 3.125 mg via ORAL
  Filled 2024-04-08 (×5): qty 1

## 2024-04-08 MED ORDER — SODIUM CHLORIDE 0.9 % IV SOLN
1.0000 g | INTRAVENOUS | Status: DC
  Filled 2024-04-08: qty 10

## 2024-04-08 MED ORDER — CEFTRIAXONE SODIUM 1 G IJ SOLR
1.0000 g | INTRAMUSCULAR | Status: AC
  Administered 2024-04-08 – 2024-04-09 (×2): 1 g via INTRAVENOUS
  Filled 2024-04-08 (×2): qty 10

## 2024-04-08 NOTE — Progress Notes (Addendum)
 Patient ID: RANSOM NICKSON, male   DOB: July 19, 1933, 89 y.o.   MRN: [id number]  Progress Note  Patient Name: Mukhtar Shams Dubberly Date of Encounter: 04/08/2024  Primary Cardiologist: [name] Consulting HF Cardiologist: Dr. Gardenia  Subjective   Remains on ceftriaxone  for Klebsiella UTI.  Hgb 7.8 > 7.5 Plt 36>33 K (106 on admit)  CO-OX 84% this am.   CVP 5.   Tele alarm this am for afib > looks like sinus with PACs to me.  Feels great. Ambulated with PT yesterday. Only complaint is dysuria.   Inpatient Medications    Scheduled Meds:  Chlorhexidine  Gluconate Cloth  6 each Topical Daily   empagliflozin  10 mg Oral Daily   Influenza vac split trivalent PF  0.5 mL Intramuscular Tomorrow-1000   melatonin  3 mg Oral QHS   rosuvastatin   10 mg Oral Daily   sodium chloride  flush  3 mL Intravenous Q12H   sodium chloride  flush  3 mL Intravenous Q12H   spironolactone  25 mg Oral Daily   Continuous Infusions:  sodium chloride      cefTRIAXone  (ROCEPHIN )  IV     PRN Meds: sodium chloride , acetaminophen  **OR** acetaminophen , guaiFENesin -dextromethorphan , lip balm, morphine  injection, ondansetron  (ZOFRAN ) IV, mouth rinse, oxyCODONE , senna, sodium chloride  flush, sodium chloride  flush, traMADol , trimethobenzamide    Vital Signs    Vitals:   04/07/24 2257 04/08/24 0314 04/08/24 0500 04/08/24 0735  BP: 120/65 114/69  122/61  Pulse: 83 73  76  Resp: 20 20  17   Temp: 98.3 F (36.8 C) 97.8 F (36.6 C)  (!) 97.5 F (36.4 C)  TempSrc: Oral Oral  Oral  SpO2: 100% 99%  100%  Weight:   58.8 kg   Height:        Intake/Output Summary (Last 24 hours) at 04/08/2024 0836 Last data filed at 04/08/2024 0654 Gross per 24 hour  Intake 940 ml  Output 1850 ml  Net -910 ml      04/08/2024    5:00 AM 04/07/2024    6:21 AM 04/06/2024    3:25 AM  Last 3 Weights  Weight (lbs) 129 lb 10.1 oz 123 lb 14.4 oz 121 lb 4.1 oz  Weight (kg) 58.8 kg 56.2 kg 55 kg     Telemetry    NSR with PACs and  occasional PVCs, 70s-80s  Physical Exam   General:  Elderly male. Sitting up in bed. Cor: JVP ~ 6 cm. Regular rate & rhythm. 1/6 AS murmur. Lungs: clear Abdomen: soft, nontender, nondistended.  Extremities: no edema Neuro: alert & orientedx3. Affect pleasant   Labs    High Sensitivity Troponin:   Recent Labs  Lab 03/26/24 0404  TROPONINIHS 484*      Cardiac EnzymesNo results for input(s): TROPONINI in the last 168 hours. No results for input(s): TROPIPOC in the last 168 hours.   Chemistry Recent Labs  Lab 04/03/24 0434 04/04/24 0404 04/05/24 0545 04/06/24 0251 04/07/24 0515  NA 130*   < > 129* 130* 130*  K 4.1   < > 4.4 4.4 4.5  CL 97*   < > 98 100 101  CO2 26   < > 23 22 23   GLUCOSE 90   < > 94 100* 90  BUN 60*   < > 40* 37* 33*  CREATININE 1.87*   < > 1.63* 1.32* 1.35*  CALCIUM  7.9*   < > 7.7* 7.8* 7.8*  PROT 5.2*  --   --  5.0*  --   ALBUMIN   2.3*  --   --  2.2*  --   AST 26  --   --  19  --   ALT 50*  --   --  9  --   ALKPHOS 71  --   --  60  --   BILITOT 1.1  --   --  0.7  --   GFRNONAA 34*   < > 41* 52* 51*  ANIONGAP 7   < > 8 8 6    < > = values in this interval not displayed.     Hematology Recent Labs  Lab 04/07/24 0515 04/07/24 1015 04/08/24 0333  WBC 7.2 6.4 6.8  RBC 2.23* 2.32*  2.32* 2.24*  HGB 7.7* 7.8* 7.5*  HCT 22.4* 23.5* 22.4*  MCV 100.4* 101.3* 100.0  MCH 34.5* 33.6 33.5  MCHC 34.4 33.2 33.5  RDW 19.7* 19.4* 19.2*  PLT 35* 34*  36* 33*    BNPNo results for input(s): BNP, PROBNP in the last 168 hours.   DDimer  Recent Labs  Lab 04/07/24 1015  DDIMER 13.82*     Radiology    No results found.   Cardiac Studies   2d echo 04/05/24   1. S/P TAVR with mean gradient 10 mmHg and trace AI.   2. Left ventricular ejection fraction, by estimation, is 35 to 40%. The  left ventricle has moderately decreased function. The left ventricle  demonstrates global hypokinesis. Left ventricular diastolic parameters are   consistent with Grade II diastolic  dysfunction (pseudonormalization). The average left ventricular global  longitudinal strain is -8.7 %. The global longitudinal strain is abnormal.   3. Right ventricular systolic function is normal. The right ventricular  size is normal.   4. Left atrial size was severely dilated.   5. Right atrial size was severely dilated.   6. Moderate pleural effusion in the left lateral region.   7. The mitral valve is normal in structure. Mild mitral valve  regurgitation. No evidence of mitral stenosis.   8. The aortic valve has been repaired/replaced. Aortic valve  regurgitation is trivial. No aortic stenosis is present. There is a 23 mm  Sapien prosthetic (TAVR) valve present in the aortic position. Procedure  Date: 04/04/24.   9. The inferior vena cava is normal in size with greater than 50%  respiratory variability, suggesting right atrial pressure of 3 mmHg.   Patient Profile     89 y.o. male with CAD w/ prior NSTEMI 07/2021 s/p CABG (RIMA to LAD, LIMA to OM1, SVG to OM2), ischemic cardiomyopathy, hx CVA, PAD, bladder cancer, CKD IIIa, LBBB and aortic valve stenosis. He was admitted for evaluation and management of a/c CHF, acute on chronic anemia, AKI on CKD IIIb, elevated LFTs, pleural effusions requiring thoracentesis. 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 Structural/AHF team have assisted with care. EF found to be <20%, down from prior 50-55% in 12/2023. Underwent transfemoral TAVR 04/04/24.  Assessment & Plan    1. Acute systolic CHF with cardiogenic shock, pleural effusions requiring thoracentesis - 12/2023 EF 50-55% w/ reported moderate AS - 03/26/24 on admission EF <20% with severe AS - 04/05/24 POD1 TAVR EF up to 35-40% and bioprosthetic aortic valve functioning normally - Low EF at admission may be due to combination of severe AS and LBBB.   - CO-OX 84% - CVP 5 - Does  not need loop diuretic  - Stop SGLT2i for now with confirmed UTI - Continue spironolactone  to 25 mg daily  - Start 3.125 mg Coreg BID  2. Severe AS - s/p TAVR 04/04/24 - Echo 09/27 with EF up to 35-40% and normal functioning bioprosthetic aortic valve functioning - no antiplatelets currently in setting of anemia/thrombocytopenia  3. CAD - Elevated troponin - NSTEMI 2023 s/p CABG X 3 - per notes cardiac CT demonstrated patent RIMA-LAD and LIMA-OM1 as well as an occluded SVG-->OM which was not felt to explain his cardiomyopathy, angiography deferred - HS troponin 484, suspected demand ischemia - Statin initially held due to shock liver, back on rosuvastatin  10mg  daily since 03/28/24 - No aspirin  with significant anemia and thrombocytopenia.    4. Acute on chronic macrocytic anemia - No overt GI bleeding.  - Hgb averaging in 7s transfuse < 7.  - Seen by Heme/Onc, anemia felt to be multifactorial   5. AKI on CKD IIIa - Baseline Scr 1.2-1.3, peaked 2.7 in setting of shock. Scr down to 1.3 yesterday - Check CMET today  6. Elevated LFTs - Suspect shock liver, LFTs have trended down - US  abdomen with biliary sludge and gallbladder wall thickening  7. Question of Afib this admission - EKGs has shown NSR/SB with LBBB, will need to follow conduction as OP  - Tele alarms for Afib but rhythm appears to be SR with PACs on my review of telemetry. - TSH OK - Not a great candidate for [facility] given anemia/thrombocytopenia.   8. Thrombocytopenia - Platelets have continued to trend down, 106K on admit >> 30s last few days - Not on any form of heparin  at this time.  - Heme/Onc felt  thromboctyopenia likely 2/2 severe AS and recent TAVR. Tranfuse if drops below 20K.  9. UTI - Klebsiella - On ceftriaxone .     FINCH, LINDSAY N, PA-C  04/08/2024 8:36 AM  Patient seen with PA, I formulated the plan and agree with the above note.   No complaints today.  Plts still low 36 => 33.  Co-ox 84%. CVP 5.    General: NAD Neck: No JVD, no thyromegaly or thyroid  nodule.  Lungs: Clear to auscultation bilaterally with normal respiratory effort. CV: Nondisplaced PMI.  Heart regular S1/S2, no S3/S4, 1/6 SEM RUSB.  No peripheral edema.   Abdomen: Soft, nontender, no hepatosplenomegaly, no distention.  Skin: Intact without lesions or rashes.  Neurologic: Alert and oriented x 3.  Psych: Normal affect. Extremities: No clubbing or cyanosis.  HEENT: Normal.   Euvolemic, stable from cardiac standpoint.  Continue spironolactone, start Coreg 3.125 mg bid.  Stop SGLT2 inhibitor with Klebsiella UTI.    Plts slightly lower, hematology following.   He is ready for home from cardiac standpoint, main issue now is hematologic.   Ezra Shuck 04/08/2024 9:10 AM

## 2024-04-08 NOTE — Progress Notes (Signed)
 PROGRESS NOTE    Danny [name]  FMW:[id number] DOB: 06/05/1934 DOA: 03/25/2024 PCP: [name] LABOR, MD  89/M w systolic CHF, aortic stenosis, CAD, CKD, and bladder cancer sp TURP who presented with generalized weakness and fatigue.  In ED, vol overloaded, cr 2.2 , troponin 375 and 360 , Lactic acid 2.2 , hgb 7.2  Echo with reduced EF,  with signs of hypoperfusion.  Placed on milrinone  for inotropic support.  -9/19 off milrinone .  -9/22 limited echocardiogram with sever low flow gradient aortic stenosis.  - 9/23 right and left thoracentesis, 1.3 L drained on left and 1.4 on right - 9/24, structural heart team consult for TAVR  -9/26: underwent TAVR -9/27: Platelet count worsening - 9/29, hematology consult for worsening thrombocytopenia  Subjective: Okay, no complaints, denies dyspnea today  Assessment and Plan:  Acute on chronic systolic CHF Severe MR, TR Severe LFLG Aortic stenosis -Echo w EF < 20%, RV with mod reduced, severe mitral valve regurgitation, moderate to severe tricuspid valve regurgitation, low flow severe aortic valve stenosis with moderate regurgitation, moderate pulmonary valve regurgitation,  - concern for low output on admission, started on milrinone  with diuretics -Weaned off milrinone  on 9/19  - GDMT limited by hypotension, CVP was low, diuretics were on hold - Continue Aldactone, Jardiance discontinued with UTI, - Structural heart team following, underwent TAVR 9/26, repeat echo noted- Palliative care following, DNR - Discharge planning, home soon if platelets start improving  Abnormal LFTs, shock liver Improved  AKI on CKD 3a - Baseline creatinine 1.3, peaked at 2.7 in the setting of cardiogenic shock - Improving, diuretics on hold now  Coronary artery disease History of CABG 2023, at bedtime troponin was 484, felt to be demand ischemia from CHF - Statin held for shock liver, aspirin  held with severe anemia - Restart statin  PAD (peripheral  artery disease) Continue statin, aspirin  on hold  Macrocytic anemia Thrombocytopenia.  - Suspect underlying myeloproliferative disorder, mild anemia and thrombocytopenia have been ongoing issues for at least 2 years, worse now - B12 -normal - Indirect bilirubin is normal, hemolysis less likely - transfused 1 unit PRBC last week - Platelets lower, received Heparin  for TAVR 9/26> likely contributing to worsening, monitor,> appears she had hematology eval, Dr. Ileana thinks thrombocytopenia is related to severe aortic stenosis and recent TAVR, monitor for now transfuse if drops below 20K  Abnormal Bladder on CT ?  Klebsiella UTI - CT notes, Intraluminal gas within the urinary bladder including scattered punctate foci along the posterior bladder wall. 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 -pt denies any new or concerning symptoms, suspect related to bladder CA -given TAVR I started empiric antibiotics,, urine culture now growing Klebsiella, with some symptoms today, continue ceftriaxone , reassess symptomatology tomorrow  History of bladder cancer Follow up as outpatient.   DVT prophylaxis: SCDs Code Status: DNR Family Communication: none present Disposition Plan: Home with home health services  Consultants:    Procedures:   Antimicrobials:    Objective: Vitals:   04/08/24 0735 04/08/24 0900 04/08/24 0929 04/08/24 1119  BP: 122/61 (!) 113/54 (!) 113/54 111/68  Pulse: 76 82 76 83  Resp: 17 19  18   Temp: (!) 97.5 F (36.4 C) 97.7 F (36.5 C)  97.7 F (36.5 C)  TempSrc: Oral Oral  Oral  SpO2: 100% 99%  98%  Weight:      Height:        Intake/Output Summary (  Last 24 hours) at 04/08/2024 1139 Last data filed at 04/08/2024 0930 Gross per 24 hour  Intake 703 ml  Output 1500 ml  Net -797 ml   Filed Weights   04/06/24 0325 04/07/24 0621 04/08/24 0500  Weight: 55 kg 56.2 kg  58.8 kg    Examination:  Gen: Awake, Alert, Oriented X 3, chronically ill-appearing, no distress HEENT: no JVD Lungs: Good air movement bilaterally, CTAB CVS: S1S2/RRR systolic murmur Abd: soft, Non tender, non distended, BS present Extremities: No edema, right arm PICC line Skin: no new rashes on exposed skin     Data Reviewed:   CBC: Recent Labs  Lab 04/05/24 0545 04/06/24 0251 04/07/24 0515 04/07/24 1015 04/08/24 0333  WBC 6.5 6.5 7.2 6.4 6.8  NEUTROABS  --   --   --  4.8  --   HGB 7.9* 7.7* 7.7* 7.8* 7.5*  HCT 23.0* 22.3* 22.4* 23.5* 22.4*  MCV 100.4* 100.0 100.4* 101.3* 100.0  PLT 48* 37* 35* 34*  36* 33*   Basic Metabolic Panel: Recent Labs  Lab 04/02/24 0500 04/03/24 0434 04/05/24 0150 04/05/24 0545 04/06/24 0251 04/07/24 0515 04/08/24 1024  NA 131*   < > 128* 129* 130* 130* 128*  K 4.2   < > 4.5 4.4 4.4 4.5 3.9  CL 99   < > 94* 98 100 101 97*  CO2 24   < > 22 23 22 23 23   GLUCOSE 125*   < > 101* 94 100* 90 153*  BUN 60*   < > 40* 40* 37* 33* 28*  CREATININE 1.92*   < > 1.56* 1.63* 1.32* 1.35* 1.31*  CALCIUM  8.2*   < > 7.8* 7.7* 7.8* 7.8* 7.8*  MG 1.8  --   --   --   --   --   --    < > = values in this interval not displayed.   GFR: Estimated Creatinine Clearance: 33 mL/min (A) (by C-G formula based on SCr of 1.31 mg/dL (H)). Liver Function Tests: Recent Labs  Lab 04/03/24 0434 04/06/24 0251 04/08/24 1024  AST 26 19 25   ALT 50* 9 10  ALKPHOS 71 60 61  BILITOT 1.1 0.7 0.4  PROT 5.2* 5.0* 5.0*  ALBUMIN  2.3* 2.2* 2.2*   No results for input(s): LIPASE, AMYLASE in the last 168 hours.  No results for input(s): AMMONIA in the last 168 hours. Coagulation Profile: Recent Labs  Lab 04/03/24 2105 04/07/24 1015  INR 1.2 1.2   Cardiac Enzymes: No results for input(s): CKTOTAL, CKMB, CKMBINDEX, TROPONINI in the last 168 hours. BNP (last 3 results) No results for input(s): PROBNP in the last 8760 hours. HbA1C: No results  for input(s): HGBA1C in the last 72 hours.  CBG: Recent Labs  Lab 04/07/24 1115 04/07/24 1636 04/07/24 2138 04/08/24 0620 04/08/24 1117  GLUCAP 129* 123* 100* 88 85    Lipid Profile: No results for input(s): CHOL, HDL, LDLCALC, TRIG, CHOLHDL, LDLDIRECT in the last 72 hours. Thyroid  Function Tests: No results for input(s): TSH, T4TOTAL, FREET4, T3FREE, THYROIDAB in the last 72 hours. Anemia Panel: Recent Labs    04/07/24 1015  VITAMINB12 469  FOLATE 7.4  FERRITIN 306  TIBC 288  IRON 99  RETICCTPCT 2.1    Urine analysis:    Component Value Date/Time   COLORURINE YELLOW 04/04/2024 0340   APPEARANCEUR CLOUDY (A) 04/04/2024 0340   LABSPEC 1.017 04/04/2024 0340   PHURINE 5.0 04/04/2024 0340   GLUCOSEU NEGATIVE 04/04/2024 0340   HGBUR  SMALL (A) 04/04/2024 0340   BILIRUBINUR NEGATIVE 04/04/2024 0340   BILIRUBINUR large (A) 10/20/2022 1608   BILIRUBINUR negative 01/19/2022 0924   KETONESUR NEGATIVE 04/04/2024 0340   PROTEINUR 30 (A) 04/04/2024 0340   UROBILINOGEN 4.0 (A) 10/20/2022 1608   UROBILINOGEN 0.2 11/28/2013 2032   NITRITE NEGATIVE 04/04/2024 0340   LEUKOCYTESUR LARGE (A) 04/04/2024 0340   Sepsis Labs: @LABRCNTIP (procalcitonin:4,lacticidven:4)  ) Recent Results (from the past 240 hours)  Surgical pcr screen     Status: None   Collection Time: 04/03/24  7:51 PM   Specimen: Nasal Mucosa; Nasal Swab  Result Value Ref Range Status   MRSA, PCR NEGATIVE NEGATIVE Final   Staphylococcus aureus NEGATIVE NEGATIVE Final    Comment: (NOTE) The Xpert SA Assay (FDA approved for NASAL specimens in patients 43 years of age and older), is one component of a comprehensive surveillance program. It is not intended to diagnose infection nor to guide or monitor treatment. Performed at [facility], [address], [place], [place] [address]   Urine Culture (for pregnant, neutropenic or urologic patients or patients with an indwelling urinary  catheter)     Status: Abnormal   Collection Time: 04/04/24  1:10 PM   Specimen: Urine, Clean Catch  Result Value Ref Range Status   Specimen Description URINE, CLEAN CATCH  Final   Special Requests   Final    NONE Performed at [facility], [address] [place], [place] [address]    Culture >=100,000 COLONIES/mL KLEBSIELLA OXYTOCA (A)  Final   Report Status 04/07/2024 FINAL  Final   Organism ID, Bacteria KLEBSIELLA OXYTOCA (A)  Final      Susceptibility   Klebsiella oxytoca - MIC*    AMPICILLIN >=32 RESISTANT Resistant     CEFEPIME <=0.12 SENSITIVE Sensitive     ERTAPENEM <=0.12 SENSITIVE Sensitive     CEFTRIAXONE  <=0.25 SENSITIVE Sensitive     CIPROFLOXACIN <=0.06 SENSITIVE Sensitive     GENTAMICIN <=1 SENSITIVE Sensitive     NITROFURANTOIN <=16 SENSITIVE Sensitive     TRIMETH /SULFA  <=20 SENSITIVE Sensitive     AMPICILLIN/SULBACTAM 4 SENSITIVE Sensitive     PIP/TAZO Value in next row Sensitive      <=4 SENSITIVEThis is a modified FDA-approved test that has been validated and its performance characteristics determined by the reporting laboratory.  This laboratory is certified under the Clinical Laboratory Improvement Amendments CLIA as qualified to perform high complexity clinical laboratory testing.    MEROPENEM Value in next row Sensitive      <=4 SENSITIVEThis is a modified FDA-approved test that has been validated and its performance characteristics determined by the reporting laboratory.  This laboratory is certified under the Clinical Laboratory Improvement Amendments CLIA as qualified to perform high complexity clinical laboratory testing.    * >=100,000 COLONIES/mL KLEBSIELLA OXYTOCA     Radiology Studies: No results found.    Scheduled Meds:  carvedilol  3.125 mg Oral BID WC   Chlorhexidine  Gluconate Cloth  6 each Topical Daily   Influenza vac split trivalent PF  0.5 mL Intramuscular Tomorrow-1000   melatonin  3 mg Oral QHS   rosuvastatin   10 mg Oral Daily    sodium chloride  flush  3 mL Intravenous Q12H   sodium chloride  flush  3 mL Intravenous Q12H   spironolactone  25 mg Oral Daily   Continuous Infusions:  sodium chloride      cefTRIAXone  (ROCEPHIN )  IV       LOS: 13 days  Time spent:    Sigurd Pac, MD [facility]   04/08/2024, 11:39 AM

## 2024-04-09 ENCOUNTER — Encounter (HOSPITAL_COMMUNITY)

## 2024-04-09 DIAGNOSIS — D696 Thrombocytopenia, unspecified: Secondary | ICD-10-CM | POA: Diagnosis not present

## 2024-04-09 DIAGNOSIS — I350 Nonrheumatic aortic (valve) stenosis: Secondary | ICD-10-CM | POA: Diagnosis not present

## 2024-04-09 LAB — COOXEMETRY PANEL
Carboxyhemoglobin: 1.9 % — ABNORMAL HIGH (ref 0.5–1.5)
Methemoglobin: <0.7 % (ref 0.0–1.5)
O2 Saturation: 74.8 %
Total hemoglobin: 7.3 g/dL — ABNORMAL LOW (ref 12.0–16.0)

## 2024-04-09 LAB — CBC
HCT: 21.5 % — ABNORMAL LOW (ref 39.0–52.0)
Hemoglobin: 7.3 g/dL — ABNORMAL LOW (ref 13.0–17.0)
MCH: 34.4 pg — ABNORMAL HIGH (ref 26.0–34.0)
MCHC: 34 g/dL (ref 30.0–36.0)
MCV: 101.4 fL — ABNORMAL HIGH (ref 80.0–100.0)
Platelets: 30 K/uL — ABNORMAL LOW (ref 150–400)
RBC: 2.12 MIL/uL — ABNORMAL LOW (ref 4.22–5.81)
RDW: 19.1 % — ABNORMAL HIGH (ref 11.5–15.5)
WBC: 6.8 K/uL (ref 4.0–10.5)
nRBC: 0 % (ref 0.0–0.2)

## 2024-04-09 LAB — BASIC METABOLIC PANEL WITH GFR
Anion gap: 6 (ref 5–15)
BUN: 34 mg/dL — ABNORMAL HIGH (ref 8–23)
CO2: 21 mmol/L — ABNORMAL LOW (ref 22–32)
Calcium: 7.9 mg/dL — ABNORMAL LOW (ref 8.9–10.3)
Chloride: 103 mmol/L (ref 98–111)
Creatinine, Ser: 1.3 mg/dL — ABNORMAL HIGH (ref 0.61–1.24)
GFR, Estimated: 53 mL/min — ABNORMAL LOW (ref >60)
Glucose, Bld: 90 mg/dL (ref 70–99)
Potassium: 4.5 mmol/L (ref 3.5–5.1)
Sodium: 130 mmol/L — ABNORMAL LOW (ref 135–145)

## 2024-04-09 LAB — IMMATURE PLATELET FRACTION: Immature Platelet Fraction: 5.9 % (ref 1.2–8.6)

## 2024-04-09 LAB — GLUCOSE, CAPILLARY
Glucose-Capillary: 88 mg/dL (ref 70–99)
Glucose-Capillary: 109 mg/dL — ABNORMAL HIGH (ref 70–99)
Glucose-Capillary: 113 mg/dL — ABNORMAL HIGH (ref 70–99)

## 2024-04-09 LAB — HAPTOGLOBIN: Haptoglobin: 45 mg/dL (ref 38–329)

## 2024-04-09 MED ORDER — ALTEPLASE 2 MG IJ SOLR
2.0000 mg | Freq: Once | INTRAMUSCULAR | Status: AC
  Administered 2024-04-09: 2 mg
  Filled 2024-04-09: qty 2

## 2024-04-09 MED ORDER — ROMIPLOSTIM 125 MCG ~~LOC~~ SOLR
2.0000 ug/kg | Freq: Once | SUBCUTANEOUS | Status: DC
  Filled 2024-04-09: qty 0.23

## 2024-04-09 MED ORDER — ROMIPLOSTIM 250 MCG ~~LOC~~ SOLR
2.0000 ug/kg | Freq: Once | SUBCUTANEOUS | Status: AC
  Administered 2024-04-09: 115 ug via SUBCUTANEOUS
  Filled 2024-04-09: qty 0.23

## 2024-04-09 NOTE — Progress Notes (Signed)
 Danny [name]   DOB:March 14, 1934   FM#:[id number]   [id number]  Hematology follow-up Subjective: Patient is clinically stable, no bleeding, platelet count remains low in 30's.    Objective:  Vitals:   04/09/24 1130 04/09/24 1628  BP: 111/63 116/76  Pulse: 68 68  Resp: 19 14  Temp: (!) 97.5 F (36.4 C) (!) 97.4 F (36.3 C)  SpO2: 98% 97%    Body mass index is 21.8 kg/m.  Intake/Output Summary (Last 24 hours) at 04/09/2024 1803 Last data filed at 04/09/2024 1130 Gross per 24 hour  Intake 3 ml  Output 1150 ml  Net -1147 ml     Sclerae unicteric  Oropharynx clear  MSK no focal spinal tenderness, no peripheral edema  Neuro nonfocal    CBG (last 3)  Recent Labs    04/09/24 0624 04/09/24 1133 04/09/24 1627  GLUCAP 88 109* 113*     Labs:  Lab Results  Component Value Date   WBC 6.8 04/09/2024   HGB 7.3 (L) 04/09/2024   HCT 21.5 (L) 04/09/2024   MCV 101.4 (H) 04/09/2024   PLT 30 (L) 04/09/2024   NEUTROABS 4.8 04/07/2024      Urine Studies No results for input(s): UHGB, CRYS in the last 72 hours.  Invalid input(s): UACOL, UAPR, USPG, UPH, UTP, UGL, UKET, UBIL, UNIT, UROB, [place], UEPI, UWBC, [place], [place], [place], [place], [place]  Basic Metabolic Panel: Recent Labs  Lab 04/05/24 0545 04/06/24 0251 04/07/24 0515 04/08/24 1024 04/09/24 0353  NA 129* 130* 130* 128* 130*  K 4.4 4.4 4.5 3.9 4.5  CL 98 100 101 97* 103  CO2 23 22 23 23  21*  GLUCOSE 94 100* 90 153* 90  BUN 40* 37* 33* 28* 34*  CREATININE 1.63* 1.32* 1.35* 1.31* 1.30*  CALCIUM  7.7* 7.8* 7.8* 7.8* 7.9*   GFR Estimated Creatinine Clearance: 32.6 mL/min (A) (by C-G formula based on SCr of 1.3 mg/dL (H)). Liver Function Tests: Recent Labs  Lab 04/03/24 0434 04/06/24 0251 04/08/24 1024  AST 26 19 25   ALT 50* 9 10  ALKPHOS 71 60 61  BILITOT 1.1 0.7 0.4  PROT 5.2* 5.0* 5.0*  ALBUMIN  2.3* 2.2* 2.2*   No results for input(s): LIPASE, AMYLASE in the last  168 hours. No results for input(s): AMMONIA in the last 168 hours. Coagulation profile Recent Labs  Lab 04/03/24 2105 04/07/24 1015  INR 1.2 1.2    CBC: Recent Labs  Lab 04/06/24 0251 04/07/24 0515 04/07/24 1015 04/08/24 0333 04/09/24 0353  WBC 6.5 7.2 6.4 6.8 6.8  NEUTROABS  --   --  4.8  --   --   HGB 7.7* 7.7* 7.8* 7.5* 7.3*  HCT 22.3* 22.4* 23.5* 22.4* 21.5*  MCV 100.0 100.4* 101.3* 100.0 101.4*  PLT 37* 35* 34*  36* 33* 30*   Cardiac Enzymes: No results for input(s): CKTOTAL, CKMB, CKMBINDEX, TROPONINI in the last 168 hours. BNP: Invalid input(s): POCBNP CBG: Recent Labs  Lab 04/08/24 1613 04/08/24 2152 04/09/24 0624 04/09/24 1133 04/09/24 1627  GLUCAP 110* 143* 88 109* 113*   D-Dimer Recent Labs    04/07/24 1015  DDIMER 13.82*   Hgb A1c No results for input(s): HGBA1C in the last 72 hours. Lipid Profile No results for input(s): CHOL, HDL, LDLCALC, TRIG, CHOLHDL, LDLDIRECT in the last 72 hours. Thyroid  function studies No results for input(s): TSH, T4TOTAL, T3FREE, THYROIDAB in the last 72 hours.  Invalid input(s): FREET3 Anemia work up [employer]    04/07/24 1015  [id number]  469  FOLATE 7.4  FERRITIN 306  TIBC 288  IRON 99  RETICCTPCT 2.1   Microbiology Recent Results (from the past 240 hours)  Surgical pcr screen     Status: None   Collection Time: 04/03/24  7:51 PM   Specimen: Nasal Mucosa; Nasal Swab  Result Value Ref Range Status   MRSA, PCR NEGATIVE NEGATIVE Final   Staphylococcus aureus NEGATIVE NEGATIVE Final    Comment: (NOTE) The Xpert SA Assay (FDA approved for NASAL specimens in patients 61 years of age and older), is one component of a comprehensive surveillance program. It is not intended to diagnose infection nor to guide or monitor treatment. Performed at [facility], [address] [place], [place] [address]   Urine Culture (for pregnant, neutropenic or urologic patients  or patients with an indwelling urinary catheter)     Status: Abnormal   Collection Time: 04/04/24  1:10 PM   Specimen: Urine, Clean Catch  Result Value Ref Range Status   Specimen Description URINE, CLEAN CATCH  Final   Special Requests   Final    NONE Performed at [facility], [address], [place], [place] [address]    Culture >=100,000 COLONIES/mL KLEBSIELLA OXYTOCA (A)  Final   Report Status 04/07/2024 FINAL  Final   Organism ID, Bacteria KLEBSIELLA OXYTOCA (A)  Final      Susceptibility   Klebsiella oxytoca - MIC*    AMPICILLIN >=32 RESISTANT Resistant     CEFEPIME <=0.12 SENSITIVE Sensitive     ERTAPENEM <=0.12 SENSITIVE Sensitive     CEFTRIAXONE  <=0.25 SENSITIVE Sensitive     CIPROFLOXACIN <=0.06 SENSITIVE Sensitive     GENTAMICIN <=1 SENSITIVE Sensitive     NITROFURANTOIN <=16 SENSITIVE Sensitive     TRIMETH /SULFA  <=20 SENSITIVE Sensitive     AMPICILLIN/SULBACTAM 4 SENSITIVE Sensitive     PIP/TAZO Value in next row Sensitive      <=4 SENSITIVEThis is a modified FDA-approved test that has been validated and its performance characteristics determined by the reporting laboratory.  This laboratory is certified under the Clinical Laboratory Improvement Amendments CLIA as qualified to perform high complexity clinical laboratory testing.    MEROPENEM Value in next row Sensitive      <=4 SENSITIVEThis is a modified FDA-approved test that has been validated and its performance characteristics determined by the reporting laboratory.  This laboratory is certified under the Clinical Laboratory Improvement Amendments CLIA as qualified to perform high complexity clinical laboratory testing.    * >=100,000 COLONIES/mL KLEBSIELLA OXYTOCA      Studies:  No results found.  Assessment: 89 y.o. male   Worsening thrombocytopenia, likely secondary to severe AS and TAVR Microcytic anemia, secondary to multiple comorbidities, especially CKD and CHF  Acute on chronic systolic  CHF Severe aortic stenosis, status post TAVR on September 26 CAD UTI    Plan:  - Due to persistent severe thrombocytopenia, I will order 1 dose Nplate 2mcg/kg today, he may take a few to several days to see the improvement of thrombocytopenia. - Okay to discharge from hematology standpoint - I will see him back in my office in 2 weeks - If he has poor response to TPO, I may consider a bone marrow biopsy after discharge.  Onita Mattock MD 04/09/2024

## 2024-04-09 NOTE — Progress Notes (Addendum)
 Patient ID: GREOGORY [name], male   DOB: 1933/10/23, 89 y.o.   MRN: [id number]  Progress Note  Patient Name: Danny [name] Date of Encounter: 04/09/2024  Primary Cardiologist: [name] Consulting HF Cardiologist: Dr. Gardenia  Subjective   Remains on ceftriaxone  for Klebsiella UTI.  CO-OX 75%.  CVP 5.  Hgb slowly drifting down, 7.8>7.5>7.3 Plt down further to 30K (106 on admit)  No complaints, just wants to go home. Has been ambulating daily.   Inpatient Medications    Scheduled Meds:  carvedilol  3.125 mg Oral BID WC   Chlorhexidine  Gluconate Cloth  6 each Topical Daily   Influenza vac split trivalent PF  0.5 mL Intramuscular Tomorrow-1000   melatonin  3 mg Oral QHS   rosuvastatin   10 mg Oral Daily   sodium chloride  flush  3 mL Intravenous Q12H   sodium chloride  flush  3 mL Intravenous Q12H   spironolactone  25 mg Oral Daily   Continuous Infusions:  sodium chloride      cefTRIAXone  (ROCEPHIN )  IV 1 g (04/08/24 1528)   PRN Meds: sodium chloride , acetaminophen  **OR** acetaminophen , guaiFENesin -dextromethorphan , lip balm, morphine  injection, ondansetron  (ZOFRAN ) IV, mouth rinse, oxyCODONE , senna, sodium chloride  flush, sodium chloride  flush, traMADol , trimethobenzamide    Vital Signs    Vitals:   04/08/24 2059 04/08/24 2300 04/09/24 0222 04/09/24 0733  BP: (!) 107/54 (!) 112/58 108/66 (!) 148/65  Pulse: 73 67 69 81  Resp: 20 19 12 13   Temp: (!) 97.5 F (36.4 C) 97.6 F (36.4 C) 97.9 F (36.6 C) (!) 97.5 F (36.4 C)  TempSrc: Oral Axillary Axillary Oral  SpO2: 98% 98% 97% 100%  Weight:   57.6 kg   Height:        Intake/Output Summary (Last 24 hours) at 04/09/2024 1128 Last data filed at 04/09/2024 0100 Gross per 24 hour  Intake 203 ml  Output 1200 ml  Net -997 ml      04/09/2024    2:22 AM 04/08/2024    5:00 AM 04/07/2024    6:21 AM  Last 3 Weights  Weight (lbs) 126 lb 15.8 oz 129 lb 10.1 oz 123 lb 14.4 oz  Weight (kg) 57.6 kg 58.8 kg 56.2 kg      Telemetry    NSR with PVCs and PACs  Physical Exam   General:  Well appearing elderly male Cor: No JVD. Regular rate & rhythm. 1/6 AS murmur. Lungs: clear Abdomen: soft, nontender, nondistended. Extremities: no edema Neuro: alert & orientedx3. Affect pleasant   Labs    High Sensitivity Troponin:   Recent Labs  Lab 03/26/24 0404  TROPONINIHS 484*      Cardiac EnzymesNo results for input(s): TROPONINI in the last 168 hours. No results for input(s): TROPIPOC in the last 168 hours.   Chemistry Recent Labs  Lab 04/03/24 0434 04/04/24 0404 04/06/24 0251 04/07/24 0515 04/08/24 1024 04/09/24 0353  NA 130*   < > 130* 130* 128* 130*  K 4.1   < > 4.4 4.5 3.9 4.5  CL 97*   < > 100 101 97* 103  CO2 26   < > 22 23 23  21*  GLUCOSE 90   < > 100* 90 153* 90  BUN 60*   < > 37* 33* 28* 34*  CREATININE 1.87*   < > 1.32* 1.35* 1.31* 1.30*  CALCIUM  7.9*   < > 7.8* 7.8* 7.8* 7.9*  PROT 5.2*  --  5.0*  --  5.0*  --   ALBUMIN  2.3*  --  2.2*  --  2.2*  --   AST 26  --  19  --  25  --   ALT 50*  --  9  --  10  --   ALKPHOS 71  --  60  --  61  --   BILITOT 1.1  --  0.7  --  0.4  --   GFRNONAA 34*   < > 52* 51* 53* 53*  ANIONGAP 7   < > 8 6 8 6    < > = values in this interval not displayed.     Hematology Recent Labs  Lab 04/07/24 1015 04/08/24 0333 04/09/24 0353  WBC 6.4 6.8 6.8  RBC 2.32*  2.32* 2.24* 2.12*  HGB 7.8* 7.5* 7.3*  HCT 23.5* 22.4* 21.5*  MCV 101.3* 100.0 101.4*  MCH 33.6 33.5 34.4*  MCHC 33.2 33.5 34.0  RDW 19.4* 19.2* 19.1*  PLT 34*  36* 33* 30*    BNPNo results for input(s): BNP, PROBNP in the last 168 hours.   DDimer  Recent Labs  Lab 04/07/24 1015  DDIMER 13.82*     Radiology    No results found.   Cardiac Studies   2d echo 04/05/24   1. S/P TAVR with mean gradient 10 mmHg and trace AI.   2. Left ventricular ejection fraction, by estimation, is 35 to 40%. The  left ventricle has moderately decreased function. The left  ventricle  demonstrates global hypokinesis. Left ventricular diastolic parameters are  consistent with Grade II diastolic  dysfunction (pseudonormalization). The average left ventricular global  longitudinal strain is -8.7 %. The global longitudinal strain is abnormal.   3. Right ventricular systolic function is normal. The right ventricular  size is normal.   4. Left atrial size was severely dilated.   5. Right atrial size was severely dilated.   6. Moderate pleural effusion in the left lateral region.   7. The mitral valve is normal in structure. Mild mitral valve  regurgitation. No evidence of mitral stenosis.   8. The aortic valve has been repaired/replaced. Aortic valve  regurgitation is trivial. No aortic stenosis is present. There is a 23 mm  Sapien prosthetic (TAVR) valve present in the aortic position. Procedure  Date: 04/04/24.   9. The inferior vena cava is normal in size with greater than 50%  respiratory variability, suggesting right atrial pressure of 3 mmHg.   Patient Profile     89 y.o. male with CAD w/ prior NSTEMI 07/2021 s/p CABG (RIMA to LAD, LIMA to OM1, SVG to OM2), ischemic cardiomyopathy, hx CVA, PAD, bladder cancer, CKD IIIa, LBBB and aortic valve stenosis. He was admitted for evaluation and management of a/c CHF, acute on chronic anemia, AKI on CKD IIIb, elevated LFTs, pleural effusions requiring thoracentesis. 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 Structural/AHF team have assisted with care. EF found to be <20%, down from prior 50-55% in 12/2023. Underwent transfemoral TAVR 04/04/24.  Assessment & Plan    1. Acute systolic CHF with cardiogenic shock, pleural effusions requiring thoracentesis - 12/2023 EF 50-55% w/ reported moderate AS - 03/26/24 on admission EF <20% with severe AS - 04/05/24 POD1 TAVR EF up to 35-40% and bioprosthetic aortic valve functioning normally - Low EF at  admission may be due to combination of severe AS and LBBB.   - CO-OX stable. Not on inotrope support. Can stop checking. - CVP 5 - Does not need loop diuretic. Can prescribe  40 mg lasix  PRN at discharge.  - Off SGLT2i for now with confirmed UTI - Continue spironolactone 25 mg daily  - Continue coreg 3.125 mg BID - Hesitant to use ARB/ARNi with advanced age and SBP 100s-110s  2. Severe AS - s/p TAVR 04/04/24 - Echo 09/27 with EF up to 35-40% and normal functioning bioprosthetic aortic valve functioning - no antiplatelets currently in setting of anemia/thrombocytopenia  3. CAD - Elevated troponin - NSTEMI 2023 s/p CABG X 3 - per notes cardiac CT demonstrated patent RIMA-LAD and LIMA-OM1 as well as an occluded SVG-->OM which was not felt to explain his cardiomyopathy, angiography deferred - HS troponin 484, suspected demand ischemia - Statin initially held due to shock liver, back on rosuvastatin  10mg  daily since 03/28/24 - No aspirin  with significant anemia and thrombocytopenia.    4. Acute on chronic macrocytic anemia - No overt GI bleeding.  - Hgb averaging in 7s and slowly trending down.  Transfuse < 7.  - Seen by Heme/Onc, anemia felt to be multifactorial   5. AKI on CKD IIIa - Baseline Scr 1.2-1.3, peaked 2.7 in setting of shock.  - Scr now back to baseline  6. Elevated LFTs - Suspect shock liver, LFTs have trended down - US  abdomen with biliary sludge and gallbladder wall thickening  7. Question of Afib this admission - EKGs has shown NSR/SB with LBBB, will need to follow conduction as OP  - TSH OK - Not a great candidate for [facility] given anemia/thrombocytopenia.   8. Thrombocytopenia - Platelets have continued to trend down, 106K on admit >> now 30s and slowly trending down - Not on any form of heparin  at this time.  - Heme/Onc felt  thromboctyopenia likely 2/2 severe AS and recent TAVR. Tranfuse if drops below 20K. Primary discussing TPO with Heme/Onc.  9. UTI -  Klebsiella - On ceftriaxone .   Stable for discharge from HF standpoint. Main issue at this point is hematologic. Will sign off.  Has f/u scheduled in HF clinic.  FINCH, LINDSAY N, PA-C  04/09/2024 11:28 AM  Patient seen with PA, I formulated the plan and agree with the above note.   Plts remain low at 30, hgb slowly trending down.  Creatinine stable 1.3.  CVP remains 5.   General: NAD Neck: No JVD, no thyromegaly or thyroid  nodule.  Lungs: Clear to auscultation bilaterally with normal respiratory effort. CV: Nondisplaced PMI.  Heart regular S1/S2, no S3/S4, 1/6 SEM RUSB.  No peripheral edema.   Abdomen: Soft, nontender, no hepatosplenomegaly, no distention.  Skin: Intact without lesions or rashes.  Neurologic: Alert and oriented x 3.  Psych: Normal affect. Extremities: No clubbing or cyanosis.  HEENT: Normal.   He is stable from cardiac standpoint. Would continue current meds.  Lasix  can be used prn.   Still with markedly low platelets, workup per hematology.   Cardiology will sign off, call with questions.   Ezra Shuck 04/09/2024 11:55 AM

## 2024-04-09 NOTE — Care Management Important Message (Signed)
 Important Message  Patient Details  Name: Danny [name] MRN: [id number] Date of Birth: [birth year]   Important Message Given:  Yes - Medicare IM     Vonzell Arrie Sharps 04/09/2024, 11:18 AM

## 2024-04-09 NOTE — Progress Notes (Signed)
 Occupational Therapy Treatment Patient Details Name: Danny [name] MRN: [id number] DOB: 1933-09-25 Today's Date: 04/09/2024   History of present illness 89 y/o M adm 03/25/24 with weakness, fatigue HFpEF, elevated troponin, AKI and Severe AS. S/p thoracentesis 9/23. 9/29 CBC shows platelet count of 35 in the setting of progressive thrombocytopenia of unknown etiology or significance, MD plan hematology consult.  PMHx:  CAD s/p CABG, CHF, CKD, HTN, PAD, stroke, HLD, bladder CA   OT comments  Pt progressing well towards goals. Pt with significant improvement from last treatment session. Progressed activity tolerance on RA, requiring no seated or standing rest breaks during session. Began verbally educating pt on use of energy conservation strategies. Continue to recommend HHOT to optimize independence levels. Will continue to follow acutely.       If plan is discharge home, recommend the following:  A little help with walking and/or transfers;A lot of help with bathing/dressing/bathroom;Assistance with cooking/housework;Assist for transportation;Help with stairs or ramp for entrance   Equipment Recommendations  Other (comment) (rollator)       Precautions / Restrictions Precautions Precautions: Fall Recall of Precautions/Restrictions: Intact Precaution/Restrictions Comments: watch SpO2 Restrictions Weight Bearing Restrictions Per Provider Order: No       Mobility Bed Mobility Overal bed mobility: Modified Independent     General bed mobility comments: assist with line mgmt; from nearly flat bed with no rails    Transfers Overall transfer level: Needs assistance Equipment used: Rolling walker (2 wheels) Transfers: Sit to/from Stand Sit to Stand: Supervision     General transfer comment: Good hand placement and technique     Balance Overall balance assessment: Needs assistance Sitting-balance support: Feet supported, No upper extremity supported Sitting balance-Leahy  Scale: Good     Standing balance support: Bilateral upper extremity supported, During functional activity, Reliant on assistive device for balance Standing balance-Leahy Scale: Fair Standing balance comment: BUE support         ADL either performed or assessed with clinical judgement   ADL Overall ADL's : Needs assistance/impaired     Grooming: Supervision/safety;Standing   Toilet Transfer: Supervision/safety     Functional mobility during ADLs: Supervision/safety;Rolling walker (2 wheels) General ADL Comments: Improved activity tolerance, verbally educated on energy conservation strategies    Extremity/Trunk Assessment Upper Extremity Assessment Upper Extremity Assessment: Generalized weakness   Lower Extremity Assessment Lower Extremity Assessment: Defer to PT evaluation        Vision   Vision Assessment?: No apparent visual deficits         Communication Communication Communication: No apparent difficulties   Cognition Arousal: Alert Behavior During Therapy: WFL for tasks assessed/performed Cognition: No apparent impairments     Following commands: Intact Following commands impaired: Follows multi-step commands with increased time, Follows one step commands with increased time      Cueing   Cueing Techniques: Verbal cues        General Comments VSS on RA    Pertinent Vitals/ Pain       Pain Assessment Pain Assessment: No/denies pain Pain Intervention(s): Monitored during session   Frequency  Min 2X/week        Progress Toward Goals  OT Goals(current goals can now be found in the care plan section)  Progress towards OT goals: Progressing toward goals  Acute Rehab OT Goals Patient Stated Goal: To go home OT Goal Formulation: With patient Time For Goal Achievement: 04/10/24 Potential to Achieve Goals: Good ADL Goals Pt Will Perform Grooming: with modified independence;standing Pt Will Perform Lower  Body Dressing: with modified  independence;sit to/from stand;sitting/lateral leans Pt Will Transfer to Toilet: with modified independence;ambulating;regular height toilet Pt Will Perform Toileting - Clothing Manipulation and hygiene: with modified independence;sitting/lateral leans;sit to/from stand Additional ADL Goal #1: Pt will independently verbalize 3 energy conservation strategies for ADL tasks  Plan         AM-PAC OT 6 Clicks Daily Activity     Outcome Measure   Help from another person eating meals?: None Help from another person taking care of personal grooming?: A Little Help from another person toileting, which includes using toliet, bedpan, or urinal?: A Little Help from another person bathing (including washing, rinsing, drying)?: A Little Help from another person to put on and taking off regular upper body clothing?: A Little Help from another person to put on and taking off regular lower body clothing?: A Little 6 Click Score: 19    End of Session Equipment Utilized During Treatment: Gait belt;Rolling walker (2 wheels)  OT Visit Diagnosis: Unsteadiness on feet (R26.81);Other abnormalities of gait and mobility (R26.89);Muscle weakness (generalized) (M62.81)   Activity Tolerance Patient tolerated treatment well   Patient Left in bed;with call bell/phone within reach   Nurse Communication Mobility status        Time: 1550-1611 OT Time Calculation (min): 21 min  Charges: OT General Charges $OT Visit: 1 Visit OT Treatments $Self Care/Home Management : 8-22 mins  Adrianne BROCKS, OT  Acute Rehabilitation Services Office [phone] Secure chat preferred   Adrianne GORMAN Savers 04/09/2024, 4:58 PM

## 2024-04-09 NOTE — Progress Notes (Signed)
 PROGRESS NOTE    Danny [name]  FMW:[id number] DOB: [birth year] DOA: 03/25/2024 PCP: [name] LABOR, MD  89/M w systolic CHF, aortic stenosis, CAD, CKD, and bladder cancer sp TURP who presented with generalized weakness and fatigue.  In ED, vol overloaded, cr 2.2 , troponin 375 and 360 , Lactic acid 2.2 , hgb 7.2  Echo with reduced EF,  with signs of hypoperfusion.  Placed on milrinone  for inotropic support.  -9/19 off milrinone .  -9/22 limited echocardiogram with sever low flow gradient aortic stenosis.  - 9/23 right and left thoracentesis, 1.3 L drained on left and 1.4 on right - 9/24, structural heart team consult for TAVR  -9/26: underwent TAVR -9/27: Platelet count worsening - 9/29, hematology consult for worsening thrombocytopenia  Subjective: Feels well, no complaints  Assessment and Plan:  Acute on chronic systolic CHF Severe MR, TR Severe LFLG Aortic stenosis -Echo w EF < 20%, RV with mod reduced, severe mitral valve regurgitation, moderate to severe tricuspid valve regurgitation, low flow severe aortic valve stenosis with moderate regurgitation, moderate pulmonary valve regurgitation,  - concern for low output on admission, started on milrinone  with diuretics -Weaned off milrinone  on 9/19  - GDMT limited by hypotension, CVP was low, diuretics were then held - Continue Coreg, Aldactone, Jardiance discontinued with UTI, - Structural heart team following, underwent TAVR 9/26, repeat echo noted- Palliative care following, DNR - Discharge planning, home soon if platelets start improving  Macrocytic anemia Thrombocytopenia.  - Suspect underlying myeloproliferative disorder, mild anemia and thrombocytopenia have been ongoing issues for at least 2 years, worse now - B12 -normal - Indirect bilirubin is normal, hemolysis less likely - transfused 1 unit PRBC last week - Platelets lower, received Heparin  for TAVR 9/26> likely contributing to worsening, monitor,> appears  she had hematology eval, Dr. Ileana thinks thrombocytopenia is related to severe aortic stenosis and recent TAVR, monitor for now transfuse if drops below 20K - Will discuss with Dr.feng re: TPO today  Abnormal LFTs, shock liver Improved  AKI on CKD 3a - Baseline creatinine 1.3, peaked at 2.7 in the setting of cardiogenic shock - Improving, diuretics on hold now  Coronary artery disease History of CABG 2023, at bedtime troponin was 484, felt to be demand ischemia from CHF - Statin held for shock liver, aspirin  held with severe anemia - Restart statin  PAD (peripheral artery disease) Continue statin, aspirin  on hold  Abnormal Bladder on CT ?  Klebsiella UTI - CT notes, Intraluminal gas within the urinary bladder including scattered punctate foci along the posterior bladder wall. 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 -pt denies any new or concerning symptoms, suspect related to bladder CA -given TAVR I started empiric antibiotics,, urine culture now growing Klebsiella, had some symptoms yesterday, improved, completes ABX today  History of bladder cancer Follow up as outpatient.   DVT prophylaxis: SCDs Code Status: DNR Family Communication: none present Disposition Plan: Home with home health services hopefully 1 to 2 days  Consultants:    Procedures:   Antimicrobials:    Objective: Vitals:   04/08/24 2059 04/08/24 2300 04/09/24 0222 04/09/24 0733  BP: (!) 107/54 (!) 112/58 108/66 (!) 148/65  Pulse: 73 67 69 81  Resp: 20 19 12 13   Temp: (!) 97.5 F (36.4 C) 97.6 F (36.4 C) 97.9 F (36.6 C) (!) 97.5 F (36.4 C)  TempSrc: Oral Axillary Axillary Oral  SpO2: 98% 98% 97% 100%  Weight:   57.6 kg   Height:        Intake/Output Summary (Last 24 hours) at 04/09/2024 1105 Last data filed at 04/09/2024 0100 Gross per 24 hour  Intake 203 ml  Output 1800 ml  Net -1597 ml    Filed Weights   04/07/24 0621 04/08/24 0500 04/09/24 0222  Weight: 56.2 kg 58.8 kg 57.6 kg    Examination:  Gen: Awake, Alert, Oriented X 3, chronically ill-appearing, no distress HEENT: No JVD Lungs: Clear CVS: S1S2/RRR systolic murmur Abd: soft, Non tender, non distended, BS present Extremities: No edema, right arm PICC line Skin: no new rashes on exposed skin     Data Reviewed:   CBC: Recent Labs  Lab 04/06/24 0251 04/07/24 0515 04/07/24 1015 04/08/24 0333 04/09/24 0353  WBC 6.5 7.2 6.4 6.8 6.8  NEUTROABS  --   --  4.8  --   --   HGB 7.7* 7.7* 7.8* 7.5* 7.3*  HCT 22.3* 22.4* 23.5* 22.4* 21.5*  MCV 100.0 100.4* 101.3* 100.0 101.4*  PLT 37* 35* 34*  36* 33* 30*   Basic Metabolic Panel: Recent Labs  Lab 04/05/24 0545 04/06/24 0251 04/07/24 0515 04/08/24 1024 04/09/24 0353  NA 129* 130* 130* 128* 130*  K 4.4 4.4 4.5 3.9 4.5  CL 98 100 101 97* 103  CO2 23 22 23 23  21*  GLUCOSE 94 100* 90 153* 90  BUN 40* 37* 33* 28* 34*  CREATININE 1.63* 1.32* 1.35* 1.31* 1.30*  CALCIUM  7.7* 7.8* 7.8* 7.8* 7.9*   GFR: Estimated Creatinine Clearance: 32.6 mL/min (A) (by C-G formula based on SCr of 1.3 mg/dL (H)). Liver Function Tests: Recent Labs  Lab 04/03/24 0434 04/06/24 0251 04/08/24 1024  AST 26 19 25   ALT 50* 9 10  ALKPHOS 71 60 61  BILITOT 1.1 0.7 0.4  PROT 5.2* 5.0* 5.0*  ALBUMIN  2.3* 2.2* 2.2*   No results for input(s): LIPASE, AMYLASE in the last 168 hours.  No results for input(s): AMMONIA in the last 168 hours. Coagulation Profile: Recent Labs  Lab 04/03/24 2105 04/07/24 1015  INR 1.2 1.2   Cardiac Enzymes: No results for input(s): CKTOTAL, CKMB, CKMBINDEX, TROPONINI in the last 168 hours. BNP (last 3 results) No results for input(s): PROBNP in the last 8760 hours. HbA1C: No results for input(s): HGBA1C in the last 72 hours.  CBG: Recent Labs  Lab 04/08/24 0620 04/08/24 1117 04/08/24 1613 04/08/24 2152  04/09/24 0624  GLUCAP 88 85 110* 143* 88    Lipid Profile: No results for input(s): CHOL, HDL, LDLCALC, TRIG, CHOLHDL, LDLDIRECT in the last 72 hours. Thyroid  Function Tests: No results for input(s): TSH, T4TOTAL, FREET4, T3FREE, THYROIDAB in the last 72 hours. Anemia Panel: Recent Labs    04/07/24 1015  VITAMINB12 469  FOLATE 7.4  FERRITIN 306  TIBC 288  IRON 99  RETICCTPCT 2.1    Urine analysis:    Component Value Date/Time   COLORURINE YELLOW 04/04/2024 0340   APPEARANCEUR CLOUDY (A) 04/04/2024 0340   LABSPEC 1.017 04/04/2024 0340   PHURINE 5.0 04/04/2024 0340   GLUCOSEU NEGATIVE 04/04/2024 0340   HGBUR SMALL (A) 04/04/2024 0340   BILIRUBINUR NEGATIVE 04/04/2024 0340   BILIRUBINUR large (A) 10/20/2022 1608   BILIRUBINUR negative 01/19/2022 0924   KETONESUR NEGATIVE 04/04/2024 0340   PROTEINUR 30 (A) 04/04/2024 0340   UROBILINOGEN 4.0 (A) 10/20/2022 1608   UROBILINOGEN 0.2 11/28/2013 2032   NITRITE NEGATIVE 04/04/2024 0340   LEUKOCYTESUR LARGE (A) 04/04/2024 0340  Sepsis Labs: @LABRCNTIP (procalcitonin:4,lacticidven:4)  ) Recent Results (from the past 240 hours)  Surgical pcr screen     Status: None   Collection Time: 04/03/24  7:51 PM   Specimen: Nasal Mucosa; Nasal Swab  Result Value Ref Range Status   MRSA, PCR NEGATIVE NEGATIVE Final   Staphylococcus aureus NEGATIVE NEGATIVE Final    Comment: (NOTE) The Xpert SA Assay (FDA approved for NASAL specimens in patients 11 years of age and older), is one component of a comprehensive surveillance program. It is not intended to diagnose infection nor to guide or monitor treatment. Performed at [facility], [address], [place], [place] [address]   Urine Culture (for pregnant, neutropenic or urologic patients or patients with an indwelling urinary catheter)     Status: Abnormal   Collection Time: 04/04/24  1:10 PM   Specimen: Urine, Clean Catch  Result Value Ref Range Status    Specimen Description URINE, CLEAN CATCH  Final   Special Requests   Final    NONE Performed at [facility], [address], [place], [place] [address]    Culture >=100,000 COLONIES/mL KLEBSIELLA OXYTOCA (A)  Final   Report Status 04/07/2024 FINAL  Final   Organism ID, Bacteria KLEBSIELLA OXYTOCA (A)  Final      Susceptibility   Klebsiella oxytoca - MIC*    AMPICILLIN >=32 RESISTANT Resistant     CEFEPIME <=0.12 SENSITIVE Sensitive     ERTAPENEM <=0.12 SENSITIVE Sensitive     CEFTRIAXONE  <=0.25 SENSITIVE Sensitive     CIPROFLOXACIN <=0.06 SENSITIVE Sensitive     GENTAMICIN <=1 SENSITIVE Sensitive     NITROFURANTOIN <=16 SENSITIVE Sensitive     TRIMETH /SULFA  <=20 SENSITIVE Sensitive     AMPICILLIN/SULBACTAM 4 SENSITIVE Sensitive     PIP/TAZO Value in next row Sensitive      <=4 SENSITIVEThis is a modified FDA-approved test that has been validated and its performance characteristics determined by the reporting laboratory.  This laboratory is certified under the Clinical Laboratory Improvement Amendments CLIA as qualified to perform high complexity clinical laboratory testing.    MEROPENEM Value in next row Sensitive      <=4 SENSITIVEThis is a modified FDA-approved test that has been validated and its performance characteristics determined by the reporting laboratory.  This laboratory is certified under the Clinical Laboratory Improvement Amendments CLIA as qualified to perform high complexity clinical laboratory testing.    * >=100,000 COLONIES/mL KLEBSIELLA OXYTOCA     Radiology Studies: No results found.    Scheduled Meds:  carvedilol  3.125 mg Oral BID WC   Chlorhexidine  Gluconate Cloth  6 each Topical Daily   Influenza vac split trivalent PF  0.5 mL Intramuscular Tomorrow-1000   melatonin  3 mg Oral QHS   rosuvastatin   10 mg Oral Daily   sodium chloride  flush  3 mL Intravenous Q12H   sodium chloride  flush  3 mL Intravenous Q12H   spironolactone  25 mg Oral Daily    Continuous Infusions:  sodium chloride      cefTRIAXone  (ROCEPHIN )  IV 1 g (04/08/24 1528)     LOS: 14 days    Time spent:    Sigurd Pac, MD [facility]   04/09/2024, 11:05 AM

## 2024-04-09 NOTE — Progress Notes (Signed)
 Physical Therapy Treatment Patient Details Name: Danny [name] MRN: [id number] DOB: 1933/11/04 Today's Date: 04/09/2024   History of Present Illness 89 y/o M adm 03/25/24 with weakness, fatigue HFpEF, elevated troponin, AKI and Severe AS. S/p thoracentesis 9/23. 9/29 CBC shows platelet count of 35 in the setting of progressive thrombocytopenia of unknown etiology or significance, MD plan hematology consult.  PMHx:  CAD s/p CABG, CHF, CKD, HTN, PAD, stroke, HLD, bladder CA    PT Comments  Pt is progressing towards goals.  Currently pt is Mod I to supervision for all functional activities including bed mobility, sit to stand and gait with RW. Pt has a ramped entrance at home. Due to pt current functional status, home set up and available assistance at home recommending skilled physical therapy services 3x/week in order to address strength, balance and functional mobility to decrease risk for falls, injury and re-hospitalization.      If plan is discharge home, recommend the following: Assistance with cooking/housework;A little help with walking and/or transfers;A lot of help with bathing/dressing/bathroom;Assist for transportation;Help with stairs or ramp for entrance     Equipment Recommendations  None recommended by PT       Precautions / Restrictions Precautions Precautions: Fall Recall of Precautions/Restrictions: Intact Precaution/Restrictions Comments: watch SpO2 Restrictions Weight Bearing Restrictions Per Provider Order: No     Mobility  Bed Mobility Overal bed mobility: Modified Independent Bed Mobility: Rolling, Sidelying to Sit Rolling: Modified independent (Device/Increase time) Sidelying to sit: Modified independent (Device/Increase time)       General bed mobility comments: assist with line mgmt; from nearly flat bed with no rails    Transfers Overall transfer level: Needs assistance Equipment used: Rolling walker (2 wheels) Transfers: Sit to/from Stand Sit to  Stand: Supervision           General transfer comment: from EOB>RW and RW>chair, good hand placement    Ambulation/Gait Ambulation/Gait assistance: Supervision Gait Distance (Feet): 200 Feet Assistive device: Rolling walker (2 wheels) Gait Pattern/deviations: Step-through pattern, Decreased stride length, Trunk flexed Gait velocity: reduced Gait velocity interpretation: <1.31 ft/sec, indicative of household ambulator   General Gait Details: Cues provided to keep RW on ground when turning. No LOB, supervision for safety. SpO2 WFL on RA and HR 80's bpm wtih exertion.      Balance Overall balance assessment: Needs assistance Sitting-balance support: Feet supported, No upper extremity supported Sitting balance-Leahy Scale: Good Sitting balance - Comments: EOB without support   Standing balance support: Bilateral upper extremity supported, During functional activity, Reliant on assistive device for balance Standing balance-Leahy Scale: Fair Standing balance comment: BUE support      Communication Communication Communication: No apparent difficulties  Cognition Arousal: Alert Behavior During Therapy: WFL for tasks assessed/performed   PT - Cognitive impairments: No apparent impairments     Following commands: Intact      Cueing Cueing Techniques: Verbal cues     General Comments General comments (skin integrity, edema, etc.): Pt reports very mild dizziness after 100 ft of gait, improved when back to room; no change in HR      Pertinent Vitals/Pain Pain Assessment Pain Assessment: No/denies pain     PT Goals (current goals can now be found in the care plan section) Acute Rehab PT Goals Patient Stated Goal: To return to independence so I am able to assist my daughter with my wife's care. PT Goal Formulation: With patient Time For Goal Achievement: 04/10/24 Potential to Achieve Goals: Good Progress towards PT goals: Progressing  toward goals    Frequency     Min 2X/week      PT Plan  Continue with current POC        AM-PAC PT 6 Clicks Mobility   Outcome Measure  Help needed turning from your back to your side while in a flat bed without using bedrails?: None Help needed moving from lying on your back to sitting on the side of a flat bed without using bedrails?: None Help needed moving to and from a bed to a chair (including a wheelchair)?: A Little Help needed standing up from a chair using your arms (e.g., wheelchair or bedside chair)?: A Little Help needed to walk in hospital room?: A Little Help needed climbing 3-5 steps with a railing? : A Little 6 Click Score: 20    End of Session Equipment Utilized During Treatment: Gait belt Activity Tolerance: Patient tolerated treatment well Patient left: in chair;with call bell/phone within reach;with chair alarm set Nurse Communication: Mobility status PT Visit Diagnosis: Other abnormalities of gait and mobility (R26.89);Muscle weakness (generalized) (M62.81);Difficulty in walking, not elsewhere classified (R26.2);Unsteadiness on feet (R26.81)     Time: 1202-1228 PT Time Calculation (min) (ACUTE ONLY): 26 min  Charges:    $Therapeutic Activity: 23-37 mins PT General Charges $$ ACUTE PT VISIT: 1 Visit                     Dorothyann Maier, DPT, CLT  Acute Rehabilitation Services Office: [phone] (Secure chat preferred)    Dorothyann VEAR Maier 04/09/2024, 1:28 PM

## 2024-04-10 ENCOUNTER — Telehealth: Payer: Self-pay | Admitting: Hematology

## 2024-04-10 ENCOUNTER — Other Ambulatory Visit (HOSPITAL_COMMUNITY): Payer: Self-pay

## 2024-04-10 ENCOUNTER — Other Ambulatory Visit: Payer: Self-pay

## 2024-04-10 DIAGNOSIS — D649 Anemia, unspecified: Secondary | ICD-10-CM

## 2024-04-10 DIAGNOSIS — C679 Malignant neoplasm of bladder, unspecified: Secondary | ICD-10-CM

## 2024-04-10 LAB — BASIC METABOLIC PANEL WITH GFR
Anion gap: 4 — ABNORMAL LOW (ref 5–15)
BUN: 30 mg/dL — ABNORMAL HIGH (ref 8–23)
CO2: 24 mmol/L (ref 22–32)
Calcium: 8 mg/dL — ABNORMAL LOW (ref 8.9–10.3)
Chloride: 102 mmol/L (ref 98–111)
Creatinine, Ser: 1.3 mg/dL — ABNORMAL HIGH (ref 0.61–1.24)
GFR, Estimated: 53 mL/min — ABNORMAL LOW (ref >60)
Glucose, Bld: 79 mg/dL (ref 70–99)
Potassium: 4.7 mmol/L (ref 3.5–5.1)
Sodium: 130 mmol/L — ABNORMAL LOW (ref 135–145)

## 2024-04-10 LAB — CBC
HCT: 21.2 % — ABNORMAL LOW (ref 39.0–52.0)
Hemoglobin: 7.2 g/dL — ABNORMAL LOW (ref 13.0–17.0)
MCH: 35 pg — ABNORMAL HIGH (ref 26.0–34.0)
MCHC: 34 g/dL (ref 30.0–36.0)
MCV: 102.9 fL — ABNORMAL HIGH (ref 80.0–100.0)
Platelets: 30 K/uL — ABNORMAL LOW (ref 150–400)
RBC: 2.06 MIL/uL — ABNORMAL LOW (ref 4.22–5.81)
RDW: 19 % — ABNORMAL HIGH (ref 11.5–15.5)
WBC: 7 K/uL (ref 4.0–10.5)
nRBC: 0 % (ref 0.0–0.2)

## 2024-04-10 MED ORDER — CARVEDILOL 3.125 MG PO TABS
3.1250 mg | ORAL_TABLET | Freq: Two times a day (BID) | ORAL | 0 refills | Status: DC
  Filled 2024-04-10: qty 60, 30d supply, fill #0

## 2024-04-10 MED ORDER — SPIRONOLACTONE 25 MG PO TABS
25.0000 mg | ORAL_TABLET | Freq: Every day | ORAL | 0 refills | Status: DC
  Filled 2024-04-10: qty 30, 30d supply, fill #0

## 2024-04-10 MED ORDER — FUROSEMIDE 40 MG PO TABS
40.0000 mg | ORAL_TABLET | Freq: Every day | ORAL | 0 refills | Status: DC | PRN
  Filled 2024-04-10: qty 30, 30d supply, fill #0

## 2024-04-10 MED ORDER — ROSUVASTATIN CALCIUM 10 MG PO TABS
10.0000 mg | ORAL_TABLET | Freq: Every day | ORAL | 0 refills | Status: DC
  Filled 2024-04-10: qty 30, 30d supply, fill #0

## 2024-04-10 NOTE — Progress Notes (Signed)
CVAD removed per protocol per MD order. Manual pressure applied for 5 mins. Vaseline gauze, gauze, and Tegaderm applied over insertion site. No bleeding or swelling noted. Instructed patient to remain in bed for thirty mins. Educated patient about S/S of infection and when to call MD; no heavy lifting or pressure on right side for 24 hours; keep dressing dry and intact for 24 hours. Pt verbalized comprehension.

## 2024-04-10 NOTE — TOC Transition Note (Signed)
 Transition of Care (TOC) - Discharge Note Rayfield Gobble RN, BSN Inpatient Care Management Unit 4E- RN Case Manager See Treatment Team for direct phone #   Patient Details  Name: Danny [name] MRN: [id number] Date of Birth: 1933-07-16  Transition of Care [facility]) CM/SW Contact:  Gobble Rayfield Hurst, RN Phone Number: 04/10/2024, 10:11 AM   Clinical Narrative:    Pt stable for transition home today, per previous CM- HH has been set up with [facility]- MD added order for OT as well.  [facility] liaison notified of discharge and updated HH orders for start of care  Family will transport home.   No further IP CM needs noted, interventions completed.    Final next level of care: Home w Home Health Services Barriers to Discharge: Barriers Resolved   Patient Goals and CMS Choice Patient states their goals for this hospitalization and ongoing recovery are:: wants to remain independent   Choice offered to / list presented to : Patient, Spouse      Discharge Placement                 Home w/ [facility]      Discharge Plan and Services Additional resources added to the After Visit Summary for     Discharge Planning Services: CM Consult Post Acute Care Choice: Home Health          DME Arranged: N/A DME Agency: NA       HH Arranged: RN, Disease Management, PT, OT HH Agency: [facility] Date [facility] Agency Contacted: 04/10/24 Time HH Agency Contacted: 1011 Representative spoke with at [facility] Agency: [place]  Social Drivers of Health (SDOH) Interventions SDOH Screenings   Food Insecurity: No Food Insecurity (03/26/2024)  Housing: Low Risk  (03/26/2024)  Transportation Needs: No Transportation Needs (03/26/2024)  Utilities: Not At Risk (03/26/2024)  Alcohol Screen: Low Risk  (11/23/2023)  Depression (PHQ2-9): Low Risk  (11/23/2023)  Financial Resource Strain: Medium Risk (11/23/2023)  Physical Activity: Inactive (11/23/2023)  Social Connections: Moderately Isolated (03/26/2024)   Stress: No Stress Concern Present (11/23/2023)  Tobacco Use: Medium Risk (04/04/2024)  Health Literacy: Adequate Health Literacy (11/23/2023)     Readmission Risk Interventions    04/10/2024   10:11 AM 03/26/2024    4:38 PM 08/10/2021   10:15 AM  Readmission Risk Prevention Plan  Post Dischage Appt   Complete  Medication Screening   Complete  Transportation Screening  Complete Complete  Home Care Screening  Complete   Medication Review (RN CM)  Complete   HRI or Home Care Consult Complete    Social Work Consult for Recovery Care Planning/Counseling Complete    Palliative Care Screening Not Applicable    Medication Review Oceanographer) Complete

## 2024-04-10 NOTE — Discharge Summary (Signed)
 Physician Discharge Summary   Patient: Danny [name] MRN: [id number] DOB: Feb 07, 1934  Admit date:     03/25/2024  Discharge date: 04/10/24  Discharge Physician: Garnette Pelt   PCP: [name] LABOR, MD   Recommendations at discharge:    Follow up with PCP in 1-2 weeks Follow up with Cardiology as scheduled Follow up with Hematology as scheduled  Discharge Diagnoses: Active Problems:   Acute on chronic systolic CHF (congestive heart failure) (HCC)   Essential hypertension   Chronic kidney disease, stage 3a (HCC)   Coronary artery disease   PAD (peripheral artery disease)   Hyperlipidemia   Macrocytic anemia   History of bladder cancer   Pleural effusion on left   S/P TAVR (transcatheter aortic valve replacement)   Palliative care by specialist   Aortic stenosis, severe  Resolved Problems:   * No resolved hospital problems. [facility] Course: 89/M w systolic CHF, aortic stenosis, CAD, CKD, and bladder cancer sp TURP who presented with generalized weakness and fatigue.  In ED, vol overloaded, cr 2.2 , troponin 375 and 360 , Lactic acid 2.2 , hgb 7.2  Echo with reduced EF,  with signs of hypoperfusion.  Placed on milrinone  for inotropic support.  -9/19 off milrinone .  -9/22 limited echocardiogram with sever low flow gradient aortic stenosis.  - 9/23 right and left thoracentesis, 1.3 L drained on left and 1.4 on right - 9/24, structural heart team consult for TAVR  -9/26: underwent TAVR -9/27: Platelet count worsening - 9/29, hematology consult for worsening thrombocytopenia  Assessment and Plan: Acute on chronic systolic CHF Severe MR, TR Severe LFLG Aortic stenosis -Echo w EF < 20%, RV with mod reduced, severe mitral valve regurgitation, moderate to severe tricuspid valve regurgitation, low flow severe aortic valve stenosis with moderate regurgitation, moderate pulmonary valve regurgitation,  - concern for low output on admission, started on milrinone  with  diuretics -Weaned off milrinone  on 9/19  - GDMT limited by hypotension, CVP was low, diuretics were then held - Continue Coreg, Jardiance discontinued with UTI, - Structural heart team following, underwent TAVR 9/26, repeat echo noted   Macrocytic anemia Thrombocytopenia.  - Suspect underlying myeloproliferative disorder, mild anemia and thrombocytopenia have been ongoing issues for at least 2 years, worse now - B12 -normal - Indirect bilirubin is normal, hemolysis less likely - transfused 1 unit PRBC last week - Platelets lower, received Heparin  for TAVR 9/26> likely contributing to worsening, monitor,> appears she had hematology eval, Dr. Ileana thinks thrombocytopenia is related to severe aortic stenosis and recent TAVR - Stable for d/c from Hematology standpoint   Abnormal LFTs, shock liver Improved   AKI on CKD 3a - Baseline creatinine 1.3, peaked at 2.7 in the setting of cardiogenic shock -improving   Coronary artery disease History of CABG 2023, at bedtime troponin was 484, felt to be demand ischemia from CHF - Statin held for shock liver, aspirin  held with severe anemia - Restarted statin at lower dose   PAD (peripheral artery disease) Continue statin, aspirin  on hold   Abnormal Bladder on CT ?  Klebsiella UTI - CT notes, Intraluminal gas within the urinary bladder including scattered punctate foci along the posterior bladder wall. 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 -pt denies any new or concerning symptoms, suspect related to bladder CA -given TAVR I started empiric antibiotics,, urine culture now growing Klebsiella, had some symptoms yesterday, improved, completed antibiotics   History  of bladder cancer Follow up as outpatient.    Consultants: Hematology, Cardiology, Palliative Care, CT Surgery Procedures performed: Transcatheter Aortic Valve Replacement     Disposition: Home health Diet recommendation:  Cardiac diet DISCHARGE MEDICATION: Allergies as of 04/10/2024       Reactions   Lipitor [atorvastatin]    Muscle soreness        Medication List     STOP taking these medications    aspirin  EC 81 MG tablet   oxybutynin  5 MG tablet Commonly known as: DITROPAN    phenazopyridine  100 MG tablet Commonly known as: PYRIDIUM        TAKE these medications    carvedilol 3.125 MG tablet Commonly known as: COREG Take 1 tablet (3.125 mg total) by mouth 2 (two) times daily with a meal.   furosemide  40 MG tablet Commonly known as: Lasix  Take 1 tablet (40 mg total) by mouth daily as needed for fluid or edema.   nitroGLYCERIN  0.4 MG SL tablet Commonly known as: NITROSTAT  Place 1 tablet (0.4 mg total) under the tongue every 5 (five) minutes as needed for chest pain.   rosuvastatin  10 MG tablet Commonly known as: CRESTOR  Take 1 tablet (10 mg total) by mouth daily. Start taking on: April 11, 2024 What changed:  medication strength how much to take   spironolactone 25 MG tablet Commonly known as: ALDACTONE Take 1 tablet (25 mg total) by mouth daily. Start taking on: April 11, 2024        Follow-up Information     [facility] Follow up.   Specialty: Home Health Services Why: Home Health agency will call to arrange appt (HHRN/PT/OT) Contact information: [address] [place] [address] [phone]         [facility] Follow up on 04/21/2024.   Specialty: Cardiology Why: 04/21/24 9:30 AM   Hospital follow up in the Advanced [facility] Failure Clinic at [facility] [place] [facility] Contact information: [address] [place]  [address] [phone]        [name] LABOR, MD Follow up.   Specialty: Internal Medicine Why: please contact PCP office to schedule a hospital follow up appt to  be seen in 7-14 days Contact information: [address] [place] [address] [phone]         Lanny Callander, MD Follow up in 2 week(s).   Specialties: Hematology, Oncology Why: Hospital follow up Contact information: [address] [place] [address] [phone]                Discharge Exam: Fredricka Weights   04/08/24 0500 04/09/24 0222 04/10/24 0313  Weight: 58.8 kg 57.6 kg 56.5 kg   General exam: Awake, laying in bed, in nad Respiratory system: Normal respiratory effort, no wheezing Cardiovascular system: regular rate, s1, s2 Gastrointestinal system: Soft, nondistended, positive BS Central nervous system: CN2-12 grossly intact, strength intact Extremities: Perfused, no clubbing Skin: Normal skin turgor, no notable skin lesions seen Psychiatry: Mood normal // no visual hallucinations   Condition at discharge: fair  The results of significant diagnostics from this hospitalization (including imaging, microbiology, ancillary and laboratory) are listed below for reference.   Imaging Studies: ECHOCARDIOGRAM COMPLETE Result Date: 04/05/2024    ECHOCARDIOGRAM REPORT   Patient Name:   Danny [name] Date of Exam: 04/05/2024 Medical Rec #:  [id number]      Height:  64.0 in Accession #:    [id number]     Weight:       121.3 lb Date of Birth:  [birth year]     BSA:          1.582 m Patient Age:    89 years       BP:           122/70 mmHg Patient Gender: M              HR:           77 bpm. Exam Location:  Inpatient Procedure: 2D Echo, 3D Echo, Cardiac Doppler, Color Doppler and Strain Analysis            (Both Spectral and Color Flow Doppler were utilized during            procedure). Indications:    Post TAVR evaluation V43.3 / Z95.2  History:        Patient has prior history of Echocardiogram examinations, most                 recent 04/04/2024. CHF, CAD and Previous Myocardial Infarction,                 Prior CABG, Arrythmias:Bradycardia; Risk  Factors:Hypertension                 and Dyslipidemia. Chronic kidney disease, stage 3a (HCC), S/P                 TAVR (transcatheter aortic valve replacement).                 Aortic Valve: 23 mm Sapien prosthetic, stented (TAVR) valve is                 present in the aortic position. Procedure Date: 04/04/24.  Sonographer:    Aida Pizza RCS Referring Phys: [id number] [facility] R THOMPSON  Sonographer Comments: Global longitudinal strain was attempted. IMPRESSIONS  1. S/P TAVR with mean gradient 10 mmHg and trace AI.  2. Left ventricular ejection fraction, by estimation, is 35 to 40%. The left ventricle has moderately decreased function. The left ventricle demonstrates global hypokinesis. Left ventricular diastolic parameters are consistent with Grade II diastolic dysfunction (pseudonormalization). The average left ventricular global longitudinal strain is -8.7 %. The global longitudinal strain is abnormal.  3. Right ventricular systolic function is normal. The right ventricular size is normal.  4. Left atrial size was severely dilated.  5. Right atrial size was severely dilated.  6. Moderate pleural effusion in the left lateral region.  7. The mitral valve is normal in structure. Mild mitral valve regurgitation. No evidence of mitral stenosis.  8. The aortic valve has been repaired/replaced. Aortic valve regurgitation is trivial. No aortic stenosis is present. There is a 23 mm Sapien prosthetic (TAVR) valve present in the aortic position. Procedure Date: 04/04/24.  9. The inferior vena cava is normal in size with greater than 50% respiratory variability, suggesting right atrial pressure of 3 mmHg. FINDINGS  Left Ventricle: Left ventricular ejection fraction, by estimation, is 35 to 40%. The left ventricle has moderately decreased function. The left ventricle demonstrates global hypokinesis. The average left ventricular global longitudinal strain is -8.7 %.  Strain was performed and the global longitudinal strain is  abnormal. The left ventricular internal cavity size was normal in size. There is no left ventricular hypertrophy. Left ventricular diastolic parameters are consistent with Grade II diastolic dysfunction (pseudonormalization). Right Ventricle: The right ventricular  size is normal. Right ventricular systolic function is normal. Left Atrium: Left atrial size was severely dilated. Right Atrium: Right atrial size was severely dilated. Pericardium: There is no evidence of pericardial effusion. Mitral Valve: The mitral valve is normal in structure. Mild mitral annular calcification. Mild mitral valve regurgitation. No evidence of mitral valve stenosis. Tricuspid Valve: The tricuspid valve is normal in structure. Tricuspid valve regurgitation is mild . No evidence of tricuspid stenosis. Aortic Valve: The aortic valve has been repaired/replaced. Aortic valve regurgitation is trivial. No aortic stenosis is present. Aortic valve mean gradient measures 10.5 mmHg. Aortic valve peak gradient measures 20.3 mmHg. Aortic valve area, by VTI measures 1.43 cm. There is a 23 mm Sapien prosthetic, stented (TAVR) valve present in the aortic position. Procedure Date: 04/04/24. Pulmonic Valve: The pulmonic valve was normal in structure. Pulmonic valve regurgitation is not visualized. No evidence of pulmonic stenosis. Aorta: The aortic root is normal in size and structure. Venous: The inferior vena cava is normal in size with greater than 50% respiratory variability, suggesting right atrial pressure of 3 mmHg. IAS/Shunts: No atrial level shunt detected by color flow Doppler. Additional Comments: S/P TAVR with mean gradient 10 mmHg and trace AI. 3D was performed not requiring image post processing on an independent workstation and was abnormal. There is a moderate pleural effusion in the left lateral region.  LEFT VENTRICLE PLAX 2D LVIDd:         5.00 cm   Diastology LVIDs:         3.80 cm   LV e' medial:    4.79 cm/s LV PW:         0.90 cm    LV E/e' medial:  22.3 LV IVS:        1.00 cm   LV e' lateral:   10.00 cm/s LVOT diam:     2.00 cm   LV E/e' lateral: 10.7 LV SV:         62 LV SV Index:   39        2D Longitudinal Strain LVOT Area:     3.14 cm  2D Strain GLS Avg:     -8.7 %                           3D Volume EF:                          3D EF:        44 %                          LV EDV:       173 ml                          LV ESV:       97 ml                          LV SV:        76 ml RIGHT VENTRICLE RV S prime:     13.50 cm/s TAPSE (M-mode): 1.8 cm LEFT ATRIUM             Index        RIGHT ATRIUM           Index LA diam:  4.10 cm 2.59 cm/m   RA Area:     23.80 cm LA Vol (A2C):   69.0 ml 43.63 ml/m  RA Volume:   81.30 ml  51.41 ml/m LA Vol (A4C):   76.1 ml 48.12 ml/m LA Biplane Vol: 75.6 ml 47.80 ml/m  AORTIC VALVE AV Area (Vmax):    1.49 cm AV Area (Vmean):   1.44 cm AV Area (VTI):     1.43 cm AV Vmax:           225.50 cm/s AV Vmean:          149.000 cm/s AV VTI:            0.438 m AV Peak Grad:      20.3 mmHg AV Mean Grad:      10.5 mmHg LVOT Vmax:         107.00 cm/s LVOT Vmean:        68.350 cm/s LVOT VTI:          0.198 m LVOT/AV VTI ratio: 0.45  AORTA Ao Root diam: 3.30 cm MITRAL VALVE MV Area (PHT): 4.60 cm     SHUNTS MV Decel Time: 165 msec     Systemic VTI:  0.20 m MV E velocity: 107.00 cm/s  Systemic Diam: 2.00 cm MV A velocity: 86.40 cm/s MV E/A ratio:  1.24 Redell Shallow MD Electronically signed by Redell Shallow MD Signature Date/Time: 04/05/2024/1:03:26 PM    Final    Structural Heart Procedure Result Date: 04/04/2024 See surgical note for result.  DG CHEST PORT 1 VIEW Result Date: 04/02/2024 CLINICAL DATA:  Bilateral pleural effusions. EXAM: PORTABLE CHEST 1 VIEW COMPARISON:  Radiograph and CT yesterday FINDINGS: Stable right upper extremity PICC tip in the SVC. Unchanged cardiomegaly post median sternotomy and CABG. Trace bilateral pleural effusions. Scattered atelectasis. Improving ground-glass  opacities in the upper lung zones. No pneumothorax. IMPRESSION: 1. Trace bilateral pleural effusions. 2. Improving ground-glass opacities in the upper lung zones. Electronically Signed   By: Andrea Gasman M.D.   On: 04/02/2024 12:50   CT ANGIO CHEST AORTA W/CM & OR WO/CM Result Date: 04/01/2024 CLINICAL DATA:  Preoperative evaluation for aortic valve replacement. History of bladder cancer. * Tracking Code: BO * EXAM: CT ANGIOGRAPHY CHEST, ABDOMEN AND PELVIS TECHNIQUE: Multidetector CT imaging through the chest, abdomen and pelvis was performed using the standard protocol during bolus administration of intravenous contrast. Multiplanar reconstructed images and MIPs were obtained and reviewed to evaluate the vascular anatomy. RADIATION DOSE REDUCTION: This exam was performed according to the departmental dose-optimization program which includes automated exposure control, adjustment of the mA and/or kV according to patient size and/or use of iterative reconstruction technique. CONTRAST:  OMNIPAQUE  IOHEXOL  350 MG/ML SOLN COMPARISON:  CT abdomen and pelvis dated 10/19/2022 FINDINGS: CTA CHEST FINDINGS Cardiovascular: Right upper extremity PICC tip terminates in the lower SVC. Preferential opacification of the thoracic aorta. No evidence of thoracic aortic aneurysm or dissection. Irregular noncalcified atherosclerotic plaque without substantial luminal narrowing along the descending thoracic aorta. No pericardial effusion. Coronary artery calcifications. Reflux of contrast material into the hepatic veins, suggesting a degree of right heart dysfunction. Please see separately dictated cardiac CT report for detailed findings. Mediastinum/Nodes: Imaged thyroid  gland without nodules meeting criteria for imaging follow-up by size. Normal esophagus. No pathologically enlarged axillary, supraclavicular, mediastinal, or hilar lymph nodes. Lungs/Pleura: The central airways are patent. Mild interlobular septal  thickening with interspersed areas of ground-glass opacities in the bilateral upper lobes. Near-complete relaxation atelectasis of the  bilateral lower lobes. No pneumothorax. Pleural effusions. Musculoskeletal: Median sternotomy wires are nondisplaced. No acute or abnormal lytic or blastic osseous lesions. Review of the MIP images confirms the above findings. CTA ABDOMEN AND PELVIS FINDINGS VASCULAR Aorta: Aortic atherosclerosis. Irregular noncalcified atherosclerotic plaque results in moderate luminal narrowing of the juxtarenal and infrarenal abdominal aorta. No aneurysm or dissection. Celiac: Patent without evidence of aneurysm, dissection, vasculitis or significant stenosis. Segmental mild to moderate luminal narrowing of the splenic artery due to calcified plaque. SMA: Segmental mild-to-moderate luminal narrowing of the SMA due to atherosclerotic plaque. Replaced hepatic artery arises from the SMA. Renals: Single bilateral renal arteries. Mild luminal narrowing of the proximal arteries due to atherosclerotic plaque. IMA: Severe luminal narrowing of the IMA origin. Inflow: Patent without evidence of aneurysm, dissection, vasculitis or significant stenosis. Proximal Outflow: Bilateral common femoral and visualized portions of the superficial and profunda femoral arteries are patent without evidence of aneurysm, dissection, vasculitis or significant stenosis. Veins: No obvious venous abnormality within the limitations of this arterial phase study. Review of the MIP images confirms the above findings. NON-VASCULAR Hepatobiliary: No focal hepatic lesions. No intra or extrahepatic biliary ductal dilation. Normal gallbladder. Pancreas: No focal lesions or main ductal dilation. Spleen: Normal in size without focal abnormality. Adrenals/Urinary Tract: No adrenal nodules. Again seen are multiple bilateral renal cysts, including peripherally calcified irregular structure arising exophytically from the lower pole left  kidney with an adjacent intermediate attenuation cyst (6:132). Some of the cysts are simple while others are hemorrhagic/proteinaceous. No hydronephrosis or calculi. Underdistended urinary bladder contains anti dependent intraluminal gas as well as multiple scattered punctate foci of gas along the posterior urinary bladder. There is irregular mural thickening along the right bladder wall with a small nodular focus protruding into the bladder lumen measuring 7 mm (6:177). A mass in this area previously measured 2.1 cm. Stomach/Bowel: Normal appearance of the stomach. No evidence of bowel wall thickening, distention, or inflammatory changes. Appendix is not discretely seen. Lymphatic: No enlarged abdominal or pelvic lymph nodes. Reproductive: Prostate is unremarkable. Other: Small volume presacral free fluid. No free air or fluid collection. Musculoskeletal: No acute or abnormal lytic or blastic osseous lesions. Diffuse body wall edema. Review of the MIP images confirms the above findings. IMPRESSION: 1. Irregular noncalcified atherosclerotic plaque results in moderate luminal narrowing of the juxtarenal and infrarenal abdominal aorta. No aneurysm or dissection. 2. Mild interlobular septal thickening with interspersed areas of ground-glass opacities in the bilateral upper lobes, likely pulmonary edema. 3. Bilateral large pleural effusions with near-complete relaxation atelectasis of the bilateral lower lobes. 4. Irregular mural thickening along the right bladder wall with a small nodular focus protruding into the bladder lumen measuring 7 mm. A mass in this area previously measured 2.1 cm. Findings are consistent with known bladder cancer. 5. Intraluminal gas within the urinary bladder including scattered punctate foci along the posterior bladder wall. 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 6. Multiple bilateral renal  cysts, some of which are simple while others are hemorrhagic/proteinaceous. These are not substantially changed in size compared to 10/19/2022 and better evaluated on prior CT dated 11/29/2022. 7. Reflux of contrast material into the hepatic veins, suggesting a degree of right heart dysfunction. 8.  Aortic Atherosclerosis (ICD10-I70.0). Electronically Signed   By: Limin  Xu M.D.   On: 04/01/2024 18:28   CT Angio Abd/Pel w/ and/or w/o Result Date: 04/01/2024 CLINICAL DATA:  Preoperative evaluation for aortic  valve replacement. History of bladder cancer. * Tracking Code: BO * EXAM: CT ANGIOGRAPHY CHEST, ABDOMEN AND PELVIS TECHNIQUE: Multidetector CT imaging through the chest, abdomen and pelvis was performed using the standard protocol during bolus administration of intravenous contrast. Multiplanar reconstructed images and MIPs were obtained and reviewed to evaluate the vascular anatomy. RADIATION DOSE REDUCTION: This exam was performed according to the departmental dose-optimization program which includes automated exposure control, adjustment of the mA and/or kV according to patient size and/or use of iterative reconstruction technique. CONTRAST:  OMNIPAQUE  IOHEXOL  350 MG/ML SOLN COMPARISON:  CT abdomen and pelvis dated 10/19/2022 FINDINGS: CTA CHEST FINDINGS Cardiovascular: Right upper extremity PICC tip terminates in the lower SVC. Preferential opacification of the thoracic aorta. No evidence of thoracic aortic aneurysm or dissection. Irregular noncalcified atherosclerotic plaque without substantial luminal narrowing along the descending thoracic aorta. No pericardial effusion. Coronary artery calcifications. Reflux of contrast material into the hepatic veins, suggesting a degree of right heart dysfunction. Please see separately dictated cardiac CT report for detailed findings. Mediastinum/Nodes: Imaged thyroid  gland without nodules meeting criteria for imaging follow-up by size. Normal esophagus. No  pathologically enlarged axillary, supraclavicular, mediastinal, or hilar lymph nodes. Lungs/Pleura: The central airways are patent. Mild interlobular septal thickening with interspersed areas of ground-glass opacities in the bilateral upper lobes. Near-complete relaxation atelectasis of the bilateral lower lobes. No pneumothorax. Pleural effusions. Musculoskeletal: Median sternotomy wires are nondisplaced. No acute or abnormal lytic or blastic osseous lesions. Review of the MIP images confirms the above findings. CTA ABDOMEN AND PELVIS FINDINGS VASCULAR Aorta: Aortic atherosclerosis. Irregular noncalcified atherosclerotic plaque results in moderate luminal narrowing of the juxtarenal and infrarenal abdominal aorta. No aneurysm or dissection. Celiac: Patent without evidence of aneurysm, dissection, vasculitis or significant stenosis. Segmental mild to moderate luminal narrowing of the splenic artery due to calcified plaque. SMA: Segmental mild-to-moderate luminal narrowing of the SMA due to atherosclerotic plaque. Replaced hepatic artery arises from the SMA. Renals: Single bilateral renal arteries. Mild luminal narrowing of the proximal arteries due to atherosclerotic plaque. IMA: Severe luminal narrowing of the IMA origin. Inflow: Patent without evidence of aneurysm, dissection, vasculitis or significant stenosis. Proximal Outflow: Bilateral common femoral and visualized portions of the superficial and profunda femoral arteries are patent without evidence of aneurysm, dissection, vasculitis or significant stenosis. Veins: No obvious venous abnormality within the limitations of this arterial phase study. Review of the MIP images confirms the above findings. NON-VASCULAR Hepatobiliary: No focal hepatic lesions. No intra or extrahepatic biliary ductal dilation. Normal gallbladder. Pancreas: No focal lesions or main ductal dilation. Spleen: Normal in size without focal abnormality. Adrenals/Urinary Tract: No adrenal  nodules. Again seen are multiple bilateral renal cysts, including peripherally calcified irregular structure arising exophytically from the lower pole left kidney with an adjacent intermediate attenuation cyst (6:132). Some of the cysts are simple while others are hemorrhagic/proteinaceous. No hydronephrosis or calculi. Underdistended urinary bladder contains anti dependent intraluminal gas as well as multiple scattered punctate foci of gas along the posterior urinary bladder. There is irregular mural thickening along the right bladder wall with a small nodular focus protruding into the bladder lumen measuring 7 mm (6:177). A mass in this area previously measured 2.1 cm. Stomach/Bowel: Normal appearance of the stomach. No evidence of bowel wall thickening, distention, or inflammatory changes. Appendix is not discretely seen. Lymphatic: No enlarged abdominal or pelvic lymph nodes. Reproductive: Prostate is unremarkable. Other: Small volume presacral free fluid. No free air or fluid collection. Musculoskeletal: No acute or abnormal lytic or  blastic osseous lesions. Diffuse body wall edema. Review of the MIP images confirms the above findings. IMPRESSION: 1. Irregular noncalcified atherosclerotic plaque results in moderate luminal narrowing of the juxtarenal and infrarenal abdominal aorta. No aneurysm or dissection. 2. Mild interlobular septal thickening with interspersed areas of ground-glass opacities in the bilateral upper lobes, likely pulmonary edema. 3. Bilateral large pleural effusions with near-complete relaxation atelectasis of the bilateral lower lobes. 4. Irregular mural thickening along the right bladder wall with a small nodular focus protruding into the bladder lumen measuring 7 mm. A mass in this area previously measured 2.1 cm. Findings are consistent with known bladder cancer. 5. Intraluminal gas within the urinary bladder including scattered punctate foci along the posterior bladder wall. Recommend  correlation with urinalysis as these findings may reflect emphysematous cystitis, as well as history of recent instrumentation. 6. Multiple bilateral renal cysts, some of which are simple while others are hemorrhagic/proteinaceous. These are not substantially changed in size compared to 10/19/2022 and better evaluated on prior CT dated 11/29/2022. 7. Reflux of contrast material into the hepatic veins, suggesting a degree of right heart dysfunction. 8.  Aortic Atherosclerosis (ICD10-I70.0). Electronically Signed   By: Limin  Xu M.D.   On: 04/01/2024 18:28   DG CHEST PORT 1 VIEW Result Date: 04/01/2024 CLINICAL DATA:  Pleural effusion.  Post bilateral thoracentesis. EXAM: PORTABLE CHEST 1 VIEW COMPARISON:  Chest radiograph 03/25/2024.  CT chest 04/01/2024 FINDINGS: Postoperative changes in the mediastinum. Mild cardiac enlargement. No vascular congestion or edema. Minimal bilateral or pleural effusions representing interval decreased since prior CT post thoracentesis. No pneumothorax. Mediastinal contours appear intact. A right PICC line is in place with tip projecting over the low SVC region. Calcification in the aorta. Probable emphysematous changes in the lungs. Peribronchial thickening with perihilar infiltrates likely representing chronic bronchitis. IMPRESSION: 1. Minimal if any residual pleural effusions post thoracentesis. No pneumothorax. 2. Mild cardiac enlargement. 3. 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 Electronically Signed   By: Elsie Gravely M.D.   On: 04/01/2024 18:17   CT CORONARY MORPH W/CTA COR W/SCORE W/CA W/CM &/OR WO/CM Addendum Date: 04/01/2024 ADDENDUM REPORT: 04/01/2024 14:26 EXAM: OVER-READ INTERPRETATION  CT CHEST The following report is an over-read performed by radiologist Dr. Marcey Diones [facility] on 04/01/2024. This over-read does not include interpretation of cardiac or coronary anatomy or pathology. The coronary CTA interpretation by the  cardiologist is attached. COMPARISON:  None. FINDINGS: The heart size is within normal limits. No pericardial fluid is identified. Visualized segments of the thoracic aorta and central pulmonary arteries are normal in caliber. Visualized mediastinum and hilar regions demonstrate no lymphadenopathy or masses. Pulmonary edema with moderate bilateral pleural effusions. Associated compressive atelectasis of both lower lobes. Visualized no pulmonary consolidation, pneumothorax or focal nodule. Visualized upper abdomen and bony structures are unremarkable. IMPRESSION: Pulmonary edema with moderate bilateral pleural effusions. Electronically Signed   By: Marcey Moan M.D.   On: 04/01/2024 14:26   Result Date: 04/01/2024 CLINICAL DATA:  Aortic valve replacement (TAVR), pre-op eval 712435 Severe aortic stenosis [id number] EXAM: Cardiac TAVR CT TECHNIQUE: The patient was scanned on a Siemens Force 192 slice scanner. A 120 kV retrospective scan was triggered in the descending thoracic aorta at 111 HU's. Gantry rotation speed was 270 msecs and collimation was .9 mm. The 3D data set was reconstructed in 5% intervals of the R-R cycle. Systolic and diastolic phases were analyzed on a dedicated work station using MPR, MIP and VRT modes. The patient received  OMNIPAQUE  IOHEXOL  350 MG/ML SOLN of contrast. 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 Severely thickened and severely calcified aortic valve cusps. AV calcium  score: 1859 Virtual Basal Annulus Measurements: Maximum/Minimum Diameter: 27.5 x 20.2 mm Perimeter: 74.8 mm Area:  423 mm2 No significant LVOT calcifications. Membranous septal length: 8.4 mm Based on these measurements, the annulus would be suitable for a 23 mm Sapien 3 valve. Alternatively, Heart Team can consider 29 mm Evolut valve. Recommend Heart Team discussion for valve selection. Sinus of Valsalva Measurements: Non-coronary:  35 mm Right - coronary:  34 mm Left -  coronary:  34 mm Coronary height and sinus of Valsalva Height: Left main: 22 mm, Left sinus: 26 mm Right coronary: 21 mm, Right sinus: 26 mm Aorta: Common origin of the brachiocephalic and left common carotid artery. Severe aortic atherosclerosis with very severe atheromatous plaque in the infrarenal abdominal aorta. Sinotubular Junction:  31 mm Ascending Thoracic Aorta:  37 mm Aortic Arch:  30 mm Descending Thoracic Aorta:  27 mm Coronary Arteries: Normal coronary origin. Right dominance. The study was performed without use of NTG and insufficient for plaque evaluation. Coronary artery calcium  score not performed due to prior CABG. RIMA-LAD patent. LIMA-OM1 patent. SVG to OM2 appears occluded at the aortic anastomosis. Optimum Fluoroscopic Angle for Delivery: LAO 5 CAU 3 OTHER: Large bilateral pleural effusions. Atria: Biatrial dilation. Left atrial appendage: No thrombus. Mitral valve: Grossly normal, mild mitral annular calcifications. Pulmonary artery: Normal caliber. Pulmonary veins: Normal anatomy. IMPRESSION: 1. 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 Severely thickened and severely calcified aortic valve cusps. 2. Aortic valve calcium  score: 1859 3. Annulus area: 423 mm2, suitable for 23 mm Sapien 3 valve. No LVOT calcifications. Membranous septal length 8.4 mm. 4. Sufficient coronary artery heights from annulus. 5. Optimum fluoroscopic angle for delivery: LAO 5 CAU 3 6. Patent RIMA-LAD, LIMA-OM1.  Occluded SVG to OM2. 7. Severe aortic atherosclerosis with very severe atheromatous plaque in the infrarenal abdominal aorta. 8. Large bilateral pleural effusions. Electronically Signed: By: Soyla Merck M.D. On: 04/01/2024 14:07   ECHOCARDIOGRAM LIMITED Result Date: 03/31/2024    TRANSESOPHOGEAL ECHO REPORT   Patient Name:   Danny [name] Date of Exam: 03/31/2024 Medical Rec #:  [id number]      Height:       64.0 in Accession #:    [id number]     Weight:       128.5 lb Date of Birth:   Jun 24, 1934     BSA:          1.621 m Patient Age:    89 years       BP:           123/66 mmHg Patient Gender: M              HR:           79 bpm. Exam Location:  Inpatient Procedure: Limited Echo, Color Doppler and Cardiac Doppler (Both Spectral and            Color Flow Doppler were utilized during procedure). Indications:    Aortic Stenosis  History:        Patient has prior history of Echocardiogram examinations, most                 recent 03/26/2024.  Sonographer:    Tinnie Gosling RDCS Referring Phys: [id number] GLENDIA DASEN WEAVER IMPRESSIONS  1. Left ventricular ejection fraction, by estimation, is 20%. The  left ventricle has severely decreased function. The left ventricle demonstrates global hypokinesis. The left ventricular internal cavity size was mildly dilated. No LV thrombus noted.  2. Right ventricular systolic function is mildly reduced. The right ventricular size is normal. Tricuspid regurgitation signal is inadequate for assessing PA pressure.  3. Left atrial size was mildly dilated.  4. The mitral valve is degenerative. Moderate mitral valve regurgitation. No evidence of mitral stenosis. Moderate mitral annular calcification.  5. The aortic valve is tricuspid. There is severe calcifcation of the aortic valve. Aortic valve regurgitation is mild. Aortic valve mean gradient measures 19.0 mmHg. Visually, I suspect low flow/low gradient severe aortic stenosis. However, no LVOT VTI  measurement was made, unable to calculate aortic valve area. Would suggest repeat study with fully interrogation of the aortic valve.  6. The inferior vena cava is normal in size with <50% respiratory variability, suggesting right atrial pressure of 8 mmHg.  7. Left pleural effusion present. FINDINGS  Left Ventricle: Left ventricular ejection fraction, by estimation, is 20%. The left ventricle has severely decreased function. The left ventricle demonstrates global hypokinesis. The left ventricular internal cavity size was mildly  dilated. Right Ventricle: The right ventricular size is normal. No increase in right ventricular wall thickness. Right ventricular systolic function is mildly reduced. Tricuspid regurgitation signal is inadequate for assessing PA pressure. Left Atrium: Left atrial size was mildly dilated. Right Atrium: Right atrial size was normal in size. Pericardium: Left pleural effusion present. There is no evidence of pericardial effusion. Mitral Valve: The mitral valve is degenerative in appearance. There is mild calcification of the mitral valve leaflet(s). Moderate mitral annular calcification. Moderate mitral valve regurgitation. No evidence of mitral valve stenosis. Tricuspid Valve: The tricuspid valve is not assessed. Aortic Valve: The aortic valve is tricuspid. There is severe calcifcation of the aortic valve. Aortic valve regurgitation is mild. Aortic valve mean gradient measures 19.0 mmHg. Aortic valve peak gradient measures 32.7 mmHg. Pulmonic Valve: The pulmonic valve was not assessed. Aorta: The aortic root is normal in size and structure. Venous: The inferior vena cava is normal in size with less than 50% respiratory variability, suggesting right atrial pressure of 8 mmHg. IAS/Shunts: No atrial level shunt detected by color flow Doppler.  LEFT VENTRICLE PLAX 2D LVIDd:         5.30 cm LVIDs:         4.80 cm LV PW:         0.90 cm LV IVS:        0.90 cm LVOT diam:     2.10 cm LVOT Area:     3.46 cm  LV Volumes (MOD) LV vol d, MOD A2C: 162.0 ml LV vol d, MOD A4C: 160.0 ml LV vol s, MOD A2C: 132.0 ml LV vol s, MOD A4C: 124.0 ml LV SV MOD A2C:     30.0 ml LV SV MOD A4C:     160.0 ml LV SV MOD BP:      34.3 ml IVC IVC diam: 1.80 cm LEFT ATRIUM         Index LA diam:    4.10 cm 2.53 cm/m  AORTIC VALVE AV Vmax:      286.00 cm/s AV Vmean:     206.000 cm/s AV VTI:       0.549 m AV Peak Grad: 32.7 mmHg AV Mean Grad: 19.0 mmHg  AORTA Ao Root diam: 3.50 cm MITRAL VALVE MV Area (PHT): 6.12 cm    SHUNTS MV Decel Time: 124  msec     Systemic Diam: 2.10 cm MV E velocity: 92.00 cm/s MV A velocity: 70.40 cm/s MV E/A ratio:  1.31 Dalton McleanMD Electronically signed by Ezra Kanner Signature Date/Time: 03/31/2024/9:04:33 PM    Final    ECHOCARDIOGRAM COMPLETE Result Date: 03/26/2024    ECHOCARDIOGRAM REPORT   Patient Name:   Danny [name] Date of Exam: 03/26/2024 Medical Rec #:  [id number]      Height:       64.0 in Accession #:    [id number]     Weight:       128.1 lb Date of Birth:  [birth year]     BSA:          1.619 m Patient Age:    89 years       BP:           116/64 mmHg Patient Gender: M              HR:           81 bpm. Exam Location:  Inpatient Procedure: 2D Echo and Intracardiac Opacification Agent (Both Spectral and Color            Flow Doppler were utilized during procedure). Indications:    CHF  History:        Patient has prior history of Echocardiogram examinations. CHF,                 CAD, Prior CABG; Aortic Valve Disease and Mitral Valve Disease.  Sonographer:    Norleen Amour Referring Phys: MAXIMINO LABOR SMITH IMPRESSIONS  1. Left ventricular ejection fraction, by estimation, is <20%. The left ventricle has severely decreased function. The left ventricle demonstrates global hypokinesis. Left ventricular diastolic parameters are consistent with Grade II diastolic dysfunction (pseudonormalization). Elevated left ventricular end-diastolic pressure.  2. Right ventricular systolic function is moderately reduced. The right ventricular size is normal. There is moderately elevated pulmonary artery systolic pressure.  3. Left atrial size was severely dilated.  4. Right atrial size was moderately dilated.  5. Moderate pleural effusion in the left lateral region.  6. The mitral valve is degenerative. Severe mitral valve regurgitation. No evidence of mitral stenosis.  7. Tricuspid valve regurgitation is moderate to severe.  8. Low flow, low gradient severe aortic stenosis. Dimensionless index 0.22. Aortic regurgitation pressure  half-time 351 ms. The aortic valve is calcified. There is severe calcifcation of the aortic valve. There is severe thickening of the aortic valve. Aortic valve regurgitation is moderate. Severe aortic valve stenosis. Aortic valve area, by VTI measures 0.77 cm. Aortic valve mean gradient measures 17.0 mmHg. Aortic valve Vmax measures 2.85 m/s.  9. Pulmonic valve regurgitation is moderate. 10. The inferior vena cava is normal in size with <50% respiratory variability, suggesting right atrial pressure of 8 mmHg. FINDINGS  Left Ventricle: Left ventricular ejection fraction, by estimation, is <20%. The left ventricle has severely decreased function. The left ventricle demonstrates global hypokinesis. The left ventricular internal cavity size was normal in size. There is no  left ventricular hypertrophy. Left ventricular diastolic parameters are consistent with Grade II diastolic dysfunction (pseudonormalization). Elevated left ventricular end-diastolic pressure. Right Ventricle: The right ventricular size is normal. No increase in right ventricular wall thickness. Right ventricular systolic function is moderately reduced. There is moderately elevated pulmonary artery systolic pressure. The tricuspid regurgitant velocity is 3.05 m/s, and with an assumed right atrial pressure of 8 mmHg, the estimated right ventricular systolic pressure is 45.2 mmHg. Left Atrium:  Left atrial size was severely dilated. Right Atrium: Right atrial size was moderately dilated. Pericardium: There is no evidence of pericardial effusion. Mitral Valve: The mitral valve is degenerative in appearance. Severe mitral valve regurgitation. No evidence of mitral valve stenosis. Tricuspid Valve: The tricuspid valve is normal in structure. Tricuspid valve regurgitation is moderate to severe. No evidence of tricuspid stenosis. Aortic Valve: Low flow, low gradient severe aortic stenosis. Dimensionless index 0.22. Aortic regurgitation pressure half-time 351  ms. The aortic valve is calcified. There is severe calcifcation of the aortic valve. There is severe thickening of the aortic valve. Aortic valve regurgitation is moderate. Severe aortic stenosis is present. Aortic valve mean gradient measures 17.0 mmHg. Aortic valve peak gradient measures 32.6 mmHg. Aortic valve area, by VTI measures 0.77 cm. Pulmonic Valve: The pulmonic valve was normal in structure. Pulmonic valve regurgitation is moderate. No evidence of pulmonic stenosis. Aorta: The aortic root is normal in size and structure. Venous: The inferior vena cava is normal in size with less than 50% respiratory variability, suggesting right atrial pressure of 8 mmHg. IAS/Shunts: No atrial level shunt detected by color flow Doppler. Additional Comments: There is a moderate pleural effusion in the left lateral region.  LEFT VENTRICLE PLAX 2D LVIDd:         4.70 cm      Diastology LVIDs:         4.30 cm      LV e' medial:    3.81 cm/s LV PW:         1.00 cm      LV E/e' medial:  20.7 LV IVS:        1.00 cm      LV e' lateral:   10.30 cm/s LVOT diam:     2.10 cm      LV E/e' lateral: 7.6 LV SV:         39 LV SV Index:   24 LVOT Area:     3.46 cm  LV Volumes (MOD) LV vol d, MOD A2C: 197.0 ml LV vol d, MOD A4C: 148.0 ml LV vol s, MOD A2C: 132.0 ml LV vol s, MOD A4C: 128.0 ml LV SV MOD A2C:     65.0 ml LV SV MOD A4C:     148.0 ml LV SV MOD BP:      40.1 ml RIGHT VENTRICLE             IVC RV Basal diam:  4.00 cm     IVC diam: 1.50 cm RV S prime:     10.10 cm/s TAPSE (M-mode): 1.3 cm LEFT ATRIUM             Index        RIGHT ATRIUM           Index LA diam:        3.50 cm 2.16 cm/m   RA Area:     19.70 cm LA Vol (A2C):   65.3 ml 40.34 ml/m  RA Volume:   62.30 ml  38.48 ml/m LA Vol (A4C):   63.6 ml 39.29 ml/m LA Biplane Vol: 64.7 ml 39.97 ml/m  AORTIC VALVE AV Area (Vmax):    0.75 cm AV Area (Vmean):   0.71 cm AV Area (VTI):     0.77 cm AV Vmax:           285.40 cm/s AV Vmean:          190.000 cm/s AV VTI:  0.503 m AV Peak Grad:      32.6 mmHg AV Mean Grad:      17.0 mmHg LVOT Vmax:         61.80 cm/s LVOT Vmean:        38.967 cm/s LVOT VTI:          0.111 m LVOT/AV VTI ratio: 0.22  AORTA Ao Root diam: 2.80 cm Ao Asc diam:  2.40 cm MITRAL VALVE               TRICUSPID VALVE MV Area (PHT): 5.16 cm    TR Peak grad:   37.2 mmHg MV Decel Time: 147 msec    TR Vmax:        305.00 cm/s MV E velocity: 78.70 cm/s MV A velocity: 58.10 cm/s  SHUNTS MV E/A ratio:  1.35        Systemic VTI:  0.11 m                            Systemic Diam: 2.10 cm Annabella Scarce MD Electronically signed by Annabella Scarce MD Signature Date/Time: 03/26/2024/7:32:52 PM    Final    US  EKG SITE RITE Result Date: 03/26/2024 If Site Rite image not attached, placement could not be confirmed due to current cardiac rhythm.  US  Abdomen Complete Result Date: 03/26/2024 EXAM: COMPLETE ABDOMINAL ULTRASOUND TECHNIQUE: Real-time ultrasonography of the abdomen was performed. COMPARISON: CT of the abdomen and pelvis dated 11/28/2000 and abdominal sonogram dated 10/27/1999. CLINICAL HISTORY: Acute kidney injury (HCC), Transaminitis, Serum lipase elevation. FINDINGS: LIVER: The liver is diffusely heterogeneously echogenic. BILIARY SYSTEM: There is mild biliary sludge. There is thickening of the gallbladder wall, which measures 4.5 mm in thickness. There is a cholesterol polyp demonstrated. The patient is not focally tender over the gallbladder. The common bile duct measures 4 mm in diameter. KIDNEYS: The right kidney measures 8.8 cm in length and contains a simple cyst measuring approximately 1.5 cm. The left kidney measures 10.6 cm in length and contains a complex cyst measuring approximately 4.9 x 3.1 x 4.6 cm. There is a calcified rim, as noted on the previous CT. PANCREAS: The pancreatic duct measures 2 mm in diameter, within normal limits. SPLEEN: No acute abnormality. Spleen is within normal limits in size. VESSELS: The abdominal aorta is normal in  caliber measuring 2 cm in diameter, but demonstrates moderate diffuse calcific atheromatous disease. The inferior vena cava is normal in caliber. OTHER: There are bilateral pleural effusions present. IMPRESSION: 1. Mild biliary sludge and gallbladder wall thickening (4.5 mm) with cholesterol polyp. No focal tenderness over the gallbladder. 2. Bilateral pleural effusions. Electronically signed by: Evalene Coho MD 03/26/2024 05:48 AM EDT RP Workstation: [id number]   DG Chest Portable 1 View Result Date: 03/25/2024 CLINICAL DATA:  SOB EXAM: PORTABLE CHEST 1 VIEW COMPARISON:  Chest x-ray 09/07/2021 FINDINGS: The heart and mediastinal contours are unchanged. Atherosclerotic plaque. Surgical changes noted overlying the mediastinum. Left costophrenic angle collimated off view. No focal consolidation. Mild pulmonary edema. No pleural effusion. No pneumothorax. No acute osseous abnormality.  Intact sternotomy wires. IMPRESSION: 1. Mild pulmonary edema. 2.  Aortic Atherosclerosis (ICD10-I70.0). 3. Left costophrenic angle collimated off view. Electronically Signed   By: Morgane  Naveau M.D.   On: 03/25/2024 19:29    Microbiology: Results for orders placed or performed during the hospital encounter of 03/25/24  Resp panel by RT-PCR (RSV, Flu A&B, Covid) Anterior Nasal Swab     Status:  None   Collection Time: 03/25/24  7:20 PM   Specimen: Anterior Nasal Swab  Result Value Ref Range Status   SARS Coronavirus 2 by RT PCR NEGATIVE NEGATIVE Final    Comment: (NOTE) SARS-CoV-2 target nucleic acids are NOT DETECTED.  The SARS-CoV-2 RNA is generally detectable in upper respiratory specimens during the acute phase of infection. The lowest concentration of SARS-CoV-2 viral copies this assay can detect is 138 copies/mL. A negative result does not preclude SARS-Cov-2 infection and should not be used as the sole basis for treatment or other patient management decisions. A negative result may occur with  improper  specimen collection/handling, submission of specimen other than nasopharyngeal swab, presence of viral mutation(s) within the areas targeted by this assay, and inadequate number of viral copies(<138 copies/mL). A negative result must be combined with clinical observations, patient history, and epidemiological information. The expected result is Negative.  Fact Sheet for Patients:  BloggerCourse.com  Fact Sheet for Healthcare Providers:  SeriousBroker.it  This test is no t yet approved or cleared by the [place]  FDA and  has been authorized for detection and/or diagnosis of SARS-CoV-2 by FDA under an Emergency Use Authorization (EUA). This EUA will remain  in effect (meaning this test can be used) for the duration of the COVID-19 declaration under Section 564(b)(1) of the Act, 21 U.S.C.section 360bbb-3(b)(1), unless the authorization is terminated  or revoked sooner.       Influenza A by PCR NEGATIVE NEGATIVE Final   Influenza B by PCR NEGATIVE NEGATIVE Final    Comment: (NOTE) The Xpert Xpress SARS-CoV-2/FLU/RSV plus assay is intended as an aid in the diagnosis of influenza from Nasopharyngeal swab specimens and should not be used as a sole basis for treatment. Nasal washings and aspirates are unacceptable for Xpert Xpress SARS-CoV-2/FLU/RSV testing.  Fact Sheet for Patients: BloggerCourse.com  Fact Sheet for Healthcare Providers: SeriousBroker.it  This test is not yet approved or cleared by the [place]  FDA and has been authorized for detection and/or diagnosis of SARS-CoV-2 by FDA under an Emergency Use Authorization (EUA). This EUA will remain in effect (meaning this test can be used) for the duration of the COVID-19 declaration under Section 564(b)(1) of the Act, 21 U.S.C. section 360bbb-3(b)(1), unless the authorization is terminated or revoked.     Resp  Syncytial Virus by PCR NEGATIVE NEGATIVE Final    Comment: (NOTE) Fact Sheet for Patients: BloggerCourse.com  Fact Sheet for Healthcare Providers: SeriousBroker.it  This test is not yet approved or cleared by the [place]  FDA and has been authorized for detection and/or diagnosis of SARS-CoV-2 by FDA under an Emergency Use Authorization (EUA). This EUA will remain in effect (meaning this test can be used) for the duration of the COVID-19 declaration under Section 564(b)(1) of the Act, 21 U.S.C. section 360bbb-3(b)(1), unless the authorization is terminated or revoked.  Performed at [facility], [address] [place], [place] [address]   Respiratory (~20 pathogens) panel by PCR     Status: None   Collection Time: 03/26/24  4:04 AM   Specimen: Nasopharyngeal Swab; Respiratory  Result Value Ref Range Status   Adenovirus NOT DETECTED NOT DETECTED Final   Coronavirus 229E NOT DETECTED NOT DETECTED Final    Comment: (NOTE) The Coronavirus on the Respiratory Panel, DOES NOT test for the novel  Coronavirus (2019 nCoV)    Coronavirus HKU1 NOT DETECTED NOT DETECTED Final   Coronavirus NL63 NOT DETECTED NOT DETECTED Final   Coronavirus OC43 NOT DETECTED NOT  DETECTED Final   Metapneumovirus NOT DETECTED NOT DETECTED Final   Rhinovirus / Enterovirus NOT DETECTED NOT DETECTED Final   Influenza A NOT DETECTED NOT DETECTED Final   Influenza B NOT DETECTED NOT DETECTED Final   Parainfluenza Virus 1 NOT DETECTED NOT DETECTED Final   Parainfluenza Virus 2 NOT DETECTED NOT DETECTED Final   Parainfluenza Virus 3 NOT DETECTED NOT DETECTED Final   Parainfluenza Virus 4 NOT DETECTED NOT DETECTED Final   Respiratory Syncytial Virus NOT DETECTED NOT DETECTED Final   Bordetella pertussis NOT DETECTED NOT DETECTED Final   Bordetella Parapertussis NOT DETECTED NOT DETECTED Final   Chlamydophila pneumoniae NOT DETECTED NOT  DETECTED Final   Mycoplasma pneumoniae NOT DETECTED NOT DETECTED Final    Comment: Performed at [facility], [address] [place], [place] [address]  Surgical pcr screen     Status: None   Collection Time: 04/03/24  7:51 PM   Specimen: Nasal Mucosa; Nasal Swab  Result Value Ref Range Status   MRSA, PCR NEGATIVE NEGATIVE Final   Staphylococcus aureus NEGATIVE NEGATIVE Final    Comment: (NOTE) The Xpert SA Assay (FDA approved for NASAL specimens in patients 31 years of age and older), is one component of a comprehensive surveillance program. It is not intended to diagnose infection nor to guide or monitor treatment. Performed at [facility], [address] [place], [place] [address]   Urine Culture (for pregnant, neutropenic or urologic patients or patients with an indwelling urinary catheter)     Status: Abnormal   Collection Time: 04/04/24  1:10 PM   Specimen: Urine, Clean Catch  Result Value Ref Range Status   Specimen Description URINE, CLEAN CATCH  Final   Special Requests   Final    NONE Performed at [facility], [address], [place], [place] [address]    Culture >=100,000 COLONIES/mL KLEBSIELLA OXYTOCA (A)  Final   Report Status 04/07/2024 FINAL  Final   Organism ID, Bacteria KLEBSIELLA OXYTOCA (A)  Final      Susceptibility   Klebsiella oxytoca - MIC*    AMPICILLIN >=32 RESISTANT Resistant     CEFEPIME <=0.12 SENSITIVE Sensitive     ERTAPENEM <=0.12 SENSITIVE Sensitive     CEFTRIAXONE  <=0.25 SENSITIVE Sensitive     CIPROFLOXACIN <=0.06 SENSITIVE Sensitive     GENTAMICIN <=1 SENSITIVE Sensitive     NITROFURANTOIN <=16 SENSITIVE Sensitive     TRIMETH /SULFA  <=20 SENSITIVE Sensitive     AMPICILLIN/SULBACTAM 4 SENSITIVE Sensitive     PIP/TAZO Value in next row Sensitive      <=4 SENSITIVEThis is a modified FDA-approved test that has been validated and its performance characteristics determined by the reporting laboratory.  This laboratory is  certified under the Clinical Laboratory Improvement Amendments CLIA as qualified to perform high complexity clinical laboratory testing.    MEROPENEM Value in next row Sensitive      <=4 SENSITIVEThis is a modified FDA-approved test that has been validated and its performance characteristics determined by the reporting laboratory.  This laboratory is certified under the Clinical Laboratory Improvement Amendments CLIA as qualified to perform high complexity clinical laboratory testing.    * >=100,000 COLONIES/mL KLEBSIELLA OXYTOCA    Labs: CBC: Recent Labs  Lab 04/07/24 0515 04/07/24 1015 04/08/24 0333 04/09/24 0353 04/10/24 0500  WBC 7.2 6.4 6.8 6.8 7.0  NEUTROABS  --  4.8  --   --   --   HGB 7.7* 7.8* 7.5* 7.3* 7.2*  HCT 22.4* 23.5* 22.4* 21.5* 21.2*  MCV 100.4* 101.3* 100.0 101.4* 102.9*  PLT 35* 34*  36* 33* 30* 30*   Basic Metabolic Panel: Recent Labs  Lab 04/06/24 0251 04/07/24 0515 04/08/24 1024 04/09/24 0353 04/10/24 0500  NA 130* 130* 128* 130* 130*  K 4.4 4.5 3.9 4.5 4.7  CL 100 101 97* 103 102  CO2 22 23 23  21* 24  GLUCOSE 100* 90 153* 90 79  BUN 37* 33* 28* 34* 30*  CREATININE 1.32* 1.35* 1.31* 1.30* 1.30*  CALCIUM  7.8* 7.8* 7.8* 7.9* 8.0*   Liver Function Tests: Recent Labs  Lab 04/06/24 0251 04/08/24 1024  AST 19 25  ALT 9 10  ALKPHOS 60 61  BILITOT 0.7 0.4  PROT 5.0* 5.0*  ALBUMIN  2.2* 2.2*   CBG: Recent Labs  Lab 04/08/24 1613 04/08/24 2152 04/09/24 0624 04/09/24 1133 04/09/24 1627  GLUCAP 110* 143* 88 109* 113*    Discharge time spent: less than 30 minutes.  Signed: Garnette Pelt, MD [facility] 04/10/2024

## 2024-04-10 NOTE — Telephone Encounter (Signed)
 Telephone encounter created in error.

## 2024-04-10 NOTE — Progress Notes (Signed)
 Volunteer called to [facility] then [facility].

## 2024-04-10 NOTE — Progress Notes (Addendum)
 Discharge  Patient verbally understands discharge instructions.  Reviewed instructions with daughter [name] via telephone.  [name] verbally understands discharge instructions.  Tele removed. CCMD/Kristi.  Place at nurse station.  PIV remove dressing intact.    Patient removed external foley while get dressed.  Foley and right femoral dressing found on the floor.  Dressing dry blood.  Fresh bright red blood on patients underwear.  Right femoral incision site with minimal bright red bloody drainage at the site.  Pt denies pain or discomfort.  Wound assessed, cleaned and dressed by Nena NOVAK, Charge nurse.  Daughter notified of dressing site.   Patient refused flu vaccination.  States, I just get it at my pharmacy.   Ride on the way.  TOC meds ready.

## 2024-04-10 NOTE — Telephone Encounter (Signed)
 Scheduled appointments per staff message. Talked with the patient and he is aware of the made appointments.

## 2024-04-10 NOTE — Progress Notes (Signed)
 Daily Progress Note   Date: 04/10/2024   Patient Name: Danny [name]  DOB: [birth year]  MRN: [id number]  Age / Sex: 89 y.o., male  Attending Physician: Cindy Garnette POUR, MD Primary Care Physician: [name] LABOR, MD Admit Date: 03/25/2024 Length of Stay: 15 days  Reason for Follow-up: Establishing goals of care  Past Medical History:  Diagnosis Date   (HFpEF) heart failure with preserved ejection fraction (HCC)    Echo 1/23: Inferior AK, mild aortic stenosis (mean 9 mmHg, V-max 210.5 cm/s, DI 0.70), EF 55-60, GR 1 DD, normal RVSF, normal PASP, trivial MR, mild AI, borderline dilation of aortic root (39 mm)   Acid reflux    hiatal hernia   Anemia    Bladder cancer (HCC)    CAD (coronary artery disease)    S/p non-STEMI 1/23 >> s/p CABG   Carotid artery disease 08/23/2021   Pre-CABG Dopplers 1/23:R ICA 40-59; L ICA 1-39 // Carotid US  07/20/2023: RICA 40-59; LICA 1-39   CHF (congestive heart failure) (HCC)    Chronic kidney disease (CKD)    Coronary artery disease involving native coronary artery of native heart without angina pectoris 05/16/2022   Inf NSTEMI s/p CABG in 07/2021 (RIMA-LAD, LIMA-OM1, S-OM2)   Diverticulitis    Erectile dysfunction 04/20/2015   Essential hypertension 04/20/2015   History of colon polyps    History of kidney stones    History of stroke    Noted on brain MRI 07/2021   Hyperlipidemia 05/28/2015   PAD (peripheral artery disease)    Pre-CABG Dopplers 1/23: Right ABI 0.65; left ABI 0.82   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 Wendel and Dr. Aneita   Skin cancer    Stroke [facility])    noted MRI 2023 pt. unaware    Subjective:   Subjective: Chart Reviewed. Updates received. Patient Assessed. Created space and opportunity for patient  and family to explore thoughts and feelings regarding current medical situation.  Today's Discussion: Today before meeting  with the patient/family, I reviewed the chart notes including cardiology note from yesterday, PT note from yesterday, OT note from yesterday, hematology note from yesterday, hospitalist note from today.  I also reviewed vital signs, nursing flowsheets, medication administrations record, labs, and imaging. Labs reviewed include CBC today which shows continued suppression of platelet count, although stable at 30 today in the setting of progressive thrombocytopenia which hematology is attributing to severe AS and status post TAVR with plan for Nplate dose (given yesterday) and outpatient follow-up in 2 weeks.  It appears patient seems stable for discharge, possible discharge today.  Today saw the patient at bedside, no family was present.  Overall he looks much better.  He states overall he feels well, is ready to go home.  We talked about plan for outpatient PT/OT to help him improve strengthening to allow him to better care for his wife, he is anxious to get back home to sleep.  He understands he would need some help initially he states his daughter has  really stepped up.  Understands need for follow-up with pulmonology and cardiology and is worried he may not remember all of this.  I explained that this would all be on his discharge information and he would likely hear from their office is to confirm appointments closer to appointment.  Encouraged him to ask any questions and Artice help clarify information.  I shared that  given his significant improvement, plan for possible discharge palliative medicine backup at this time.  We are available while he is admitted for any concerns or needs moving forward.  I provided emotional and general support through therapeutic listening, empathy, sharing of stories, and other techniques. I answered all questions and addressed all concerns to the best of my ability.  Review of Systems  Constitutional:        Continues to feel good, ready to go home  Respiratory:   Negative for chest tightness and shortness of breath.   Cardiovascular:  Negative for chest pain.  Gastrointestinal:  Negative for abdominal pain, nausea and vomiting.    Objective:   Primary Diagnoses: Present on Admission:  Acute on chronic systolic CHF (congestive heart failure) (HCC)  Macrocytic anemia  Hyperlipidemia  Essential hypertension  Chronic kidney disease, stage 3a (HCC)  PAD (peripheral artery disease)   Vital Signs:  BP 114/86 (BP Location: Left Arm)   Pulse 66   Temp (!) 97.5 F (36.4 C) (Oral)   Resp 14   Ht 5' 4 (1.626 m)   Wt 56.5 kg   SpO2 97%   BMI 21.37 kg/m   Physical Exam Vitals and nursing note reviewed.  Constitutional:      General: He is not in acute distress.    Appearance: He is not toxic-appearing.     Comments: Appears somewhat weak but improved compared to earlier this week  HENT:     Head: Normocephalic and atraumatic.  Cardiovascular:     Rate and Rhythm: Normal rate.  Pulmonary:     Effort: Pulmonary effort is normal. No respiratory distress.     Breath sounds: No wheezing or rhonchi.  Abdominal:     General: Abdomen is flat. Bowel sounds are normal. There is no distension.     Palpations: Abdomen is soft.  Skin:    General: Skin is warm and dry.  Neurological:     General: No focal deficit present.     Mental Status: He is alert and oriented to person, place, and time.  Psychiatric:        Mood and Affect: Mood normal.        Behavior: Behavior normal.     Palliative Assessment/Data: 70-80%   Assessment & Plan:   HPI/Patient Profile:  89 y.o. male  with past medical history of heart failure with preserved ejection fraction, hyperlipidemia, CAD s/p CABG in 2023, CKD, and recurrent bladder cancer s/p TURP who presented with complaints of generalized weakness and fatigue over the last 5 days.  He was admitted on 03/25/2024 with HFpEF, elevated troponin macrocytic anemia, AKI superimposed on CKD 3B, transaminitis papillary  bladder tumor, and others.  After workup determined to be in acute systolic heart failure with EF less than 20%, severe aortic stenosis, cardiogenic shock, lactic acidosis, AKI, acute liver failure, LBBB, and others.   Palliative medicine was consulted for GOC conversations.  SUMMARY OF RECOMMENDATIONS   DNR-limited Continue full scope of care otherwise Continued support of patient and family Anticipate d/c today Palliative medicine will back off at this time, please notify us  of any significant or new needs while admitted  Symptom Management:  Per primary team Palliative medicine is available to assist as needed  Code Status: DNR - Limited (DNR/DNI)  Prognosis: > 12 months  Discharge Planning: Home with [facility] and outpatient follow-up  Discussed with: Patient, medical team, nursing team  Thank you for allowing us  to participate in the care of [facility]  R Marina PMT will continue to support holistically.  Billing based on MDM: Moderate  Detailed review of medical records (labs, imaging, vital signs), medically appropriate exam, discussed with treatment team, counseling and education to patient, family, & staff, documenting clinical information, medication management, coordination of care  Camellia Kays, NP Palliative Medicine Team  Team Phone # [phone] (Nights/Weekends)  03/08/2021, 8:17 AM

## 2024-04-11 ENCOUNTER — Telehealth: Payer: Self-pay | Admitting: *Deleted

## 2024-04-11 NOTE — Transitions of Care (Post Inpatient/ED Visit) (Signed)
   04/11/2024  Name: Danny [name] MRN: [id number] DOB: January 18, 1934  Today's TOC FU Call Status: Today's TOC FU Call Status:: Unsuccessful Call (1st Attempt) Unsuccessful Call (1st Attempt) Date: 04/11/24  Attempted to reach the patient regarding the most recent Inpatient visit.  Unsuccessful outreaches x 2 to both numbers listed for patient; unable to leave message on either number  Follow Up Plan: Additional outreach attempts will be made to reach the patient to complete the Transitions of Care (Post Inpatient/ED visit) call.   Pls call/ message for questions,  Kjirsten Bloodgood Mckinney Marvie Calender, RN, BSN, CCRN Alumnus RN Care Manager  Transitions of Care  [facility] - [facility] [phone]: direct office

## 2024-04-14 ENCOUNTER — Telehealth: Payer: Self-pay

## 2024-04-14 ENCOUNTER — Telehealth: Payer: Self-pay | Admitting: *Deleted

## 2024-04-14 NOTE — Telephone Encounter (Signed)
 Patient has not been seen in over a year with you.

## 2024-04-14 NOTE — Transitions of Care (Post Inpatient/ED Visit) (Signed)
 04/14/2024  Name: Danny [name] MRN: [id number] DOB: 1933-08-31  Today's TOC FU Call Status: Today's TOC FU Call Status:: Successful TOC FU Call Completed TOC FU Call Complete Date: 04/14/24 Patient's Name and Date of Birth confirmed.  Transition Care Management Follow-up Telephone Call Date of Discharge: 04/10/24 Discharge Facility: [facility]) Type of Discharge: Inpatient Admission Primary Inpatient Discharge Diagnosis:: CHF exacerbation with subsequent surgical TAVR How have you been since you were released from the hospital?: Better (I am doing okay.  The PT is coming around and will be here later this week.  This phone is terrible- it is so hard to hear on.  When you call next use my land line.  I will make my own appointment with Dr. Rollene at some point) Any questions or concerns?: No  Items Reviewed: Did you receive and understand the discharge instructions provided?: Yes (thoroughly reviewed with patient who verbalizes good understanding of same) Medications obtained,verified, and reconciled?: Yes (Medications Reviewed) (Full medication reconciliation/ review completed; no concerns or discrepancies identified; confirmed patient obtained/ is taking all newly Rx'd medications as instructed; self-manages medications and denies questions/ concerns around medications today) Any new allergies since your discharge?: No Dietary orders reviewed?: Yes Type of Diet Ordered:: Healthy as possible Do you have support at home?: Yes People in Home [RPT]: spouse, child(ren), adult Name of Support/Comfort Primary Source: Reports independent in self-care activities; resides with supportive spouse and adult daughter- assists as/ if needed/ indicated  Medications Reviewed Today: Medications Reviewed Today     Reviewed by Simaya Lumadue M, RN (Registered Nurse) on 04/14/24 at 1507  Med List Status: <None>   Medication Order Taking? Sig Documenting Provider Last Dose Status Informant   carvedilol (COREG) 3.125 MG tablet [id number] Yes Take 1 tablet (3.125 mg total) by mouth 2 (two) times daily with a meal. Cindy Garnette POUR, MD  Active   furosemide  (LASIX ) 40 MG tablet [id number] Yes Take 1 tablet (40 mg total) by mouth daily as needed for fluid or edema. Cindy Garnette POUR, MD  Active   nitroGLYCERIN  (NITROSTAT ) 0.4 MG SL tablet [id number] Yes Place 1 tablet (0.4 mg total) under the tongue every 5 (five) minutes as needed for chest pain. Lelon Hamilton T, PA-C  Active Self  rosuvastatin  (CRESTOR ) 10 MG tablet [id number] Yes Take 1 tablet (10 mg total) by mouth daily. Cindy Garnette POUR, MD  Active   spironolactone (ALDACTONE) 25 MG tablet [id number] Yes Take 1 tablet (25 mg total) by mouth daily. Cindy Garnette POUR, MD  Active            Home Care and Equipment/Supplies: Were Home Health Services Ordered?: Yes Name of Home Health Agency:: [facility]/ OT/ RN Has Agency set up a time to come to your home?: Yes First Home Health Visit Date: 04/12/24 Any new equipment or medical supplies ordered?: No  Functional Questionnaire: Do you need assistance with bathing/showering or dressing?: Yes (Requires minimal assistance and supervision from family: we have a handicapped walk-in shower with a built in seat) Do you need assistance with meal preparation?: Yes (Family assists with meal preparation) Do you need assistance with eating?: No Do you have difficulty maintaining continence: No Do you need assistance with getting out of bed/getting out of a chair/moving?: No Do you have difficulty managing or taking your medications?: No  Follow up appointments reviewed: PCP Follow-up appointment confirmed?: No (Patient declined care coordination outreach with scheduling care guide to schedule hospital follow up PCP appointment I'll schedule  it myself at some point verified not indicated per hospital discharging provider discharge notes) MD Provider Line Number:[phone] Given: No (verified  well-established with current PCP) Specialist Hospital Follow-up appointment confirmed?: Yes Date of Specialist follow-up appointment?: 04/21/24 Follow-Up Specialty Provider:: cardiology CHF provider Do you need transportation to your follow-up appointment?: No Do you understand care options if your condition(s) worsen?: Yes-patient verbalized understanding  SDOH Interventions Today    Flowsheet Row Most Recent Value  SDOH Interventions   Food Insecurity Interventions Intervention Not Indicated  Housing Interventions Intervention Not Indicated  Transportation Interventions Intervention Not Indicated  [Reports drives self at baseline: reports car currently in the shop,  states we are using [employer] until the care gets fixed]  Utilities Interventions Intervention Not Indicated   See TOC assessment tabs for additional assessment/ TOC intervention information  Successfully enrolled into 30-day TOC program  Pls call/ message for questions,  Beatris Blinda Lawrence, RN, BSN, Media planner  Transitions of Care  [facility] - [facility] [phone]: direct office

## 2024-04-14 NOTE — Patient Instructions (Addendum)
 Visit Information  Thank you for taking time to visit with me today. Please don't hesitate to contact me if I can be of assistance to you before our next scheduled telephone appointment.  Our next appointment is by telephone on Wednesday 04/23/24 at 2:00 pm  Please call the care guide team at [phone] if you need to cancel or reschedule your appointment.   Patient Self Care Activities:  Attend all scheduled provider appointments Call provider office for new concerns or questions  Participate in Transition of Care Program/Attend TOC scheduled calls Take medications as prescribed   call office if I gain more than 2 pounds in one day or 5 pounds in one week track weight in diary use salt in moderation watch for swelling in feet, ankles and legs every day weigh myself daily follow rescue plan if symptoms flare-up Continue working with the home health team that is involved in your care Continue pacing activity as your recuperation from recent surgery continues Use assistive devices as needed to prevent falls- your walker Weigh yourself every day to stay on top of early fluid retention: write down your weights every day so you remember what it is from day to day: follow the weight-gain guidelines and action plan to call your doctor if you gain more than 3 lbs overnight, or 5 lbs in one week Take all recorded weights from home monitoring to upcoming cardiology provider office visit for your doctor to review If you believe your condition is getting worse- contact your care providers (doctors) promptly- reaching out to your doctor early when you have concerns can prevent you from having to go to the hospital  Following is a copy of your care plan:   Goals Addressed             This Visit's Progress    [facility] Transitions of Care (TOC) Care Plan   On track    Problems:  Recent Hospitalization for treatment of CHF with subsequent surgical TAVR September 16- April 10, 2024 Independent at  baseline; using walker post-hospital discharge- does not use at baseline; resides with supportive spouse and adult daughter- assisting with care needs as indicated Vehicle currently in shop: using the [employer] to get around Baseline very poor cell reception per patient report: he requests that all subsequent calls be placed to home land line: [phone]: (1) unplanned hospital admission x last (6)/ (12) months  Goal:  Over the next 30 days, the patient will not experience hospital readmission  Interventions:  Transitions of Care: week # 1/ day # 1 Durable Medical Equipment (DME) needs assessed with patient/caregiver Doctor Visits  - discussed the importance of doctor visits Communication with PCP re: enrollment into [facility] 30-day program Post discharge activity limitations prescribed by provider reviewed Post-op wound/incision care reviewed with patient/caregiver Reviewed Signs and symptoms of infection Provided education around role of home health services with importance of participation/ ongoing engagement Confirmed currently requiring/ using assistive devices for ambulation - walker post- op; provided education/ reinforcement around fall prevention  Provided education/ reinforcement around benefit of conservative post-op activity; need to pace activity without over-doing  Reviewed upcoming provider office visits: 04/21/24: CHF provider; 04/22/24: oncology/ anemia provider; confirmed patient is aware of all and has plans to attend as scheduled Provided my direct contact information should questions/ concerns/ needs arise post-TOC initial call, prior to next TOC 30-day program RN CM telephone visit    Heart Failure Interventions: Basic overview and discussion of pathophysiology of Heart Failure reviewed Reviewed role  of diuretics in prevention of fluid overload and management of heart failure; Discussed the importance of keeping all appointments with provider Assessed social determinant of  health barriers  Briefly discussed rationale for daily weight monitoring at home along with weight gain guidelines/ action plan for weight gain; importance of taking diuretic as prescribed- he tells me he is doing this: his cell reception is so poor, I am unable to hear well for entirety of call; it sounds as if he is telling me that he is not currently near his recorded daily weights: he does not tell me until the very end of the call that he has a land-line that has better reception  Patient Self Care Activities:  Attend all scheduled provider appointments Call provider office for new concerns or questions  Participate in Transition of Care Program/Attend TOC scheduled calls Take medications as prescribed   call office if I gain more than 2 pounds in one day or 5 pounds in one week track weight in diary use salt in moderation watch for swelling in feet, ankles and legs every day weigh myself daily follow rescue plan if symptoms flare-up Continue working with the home health team that is involved in your care Continue pacing activity as your recuperation from recent surgery continues Use assistive devices as needed to prevent falls- your walker Weigh yourself every day to stay on top of early fluid retention: write down your weights every day so you remember what it is from day to day: follow the weight-gain guidelines and action plan to call your doctor if you gain more than 3 lbs overnight, or 5 lbs in one week Take all recorded weights from home monitoring to upcoming cardiology provider office visit for your doctor to review If you believe your condition is getting worse- contact your care providers (doctors) promptly- reaching out to your doctor early when you have concerns can prevent you from having to go to the hospital  Plan:  Telephone follow up appointment with care management team member scheduled for:  Wednesday 04/23/24 at 2:00 pm       The patient verbalized understanding  of instructions, educational materials, and care plan provided today and DECLINED offer to receive copy of patient instructions, educational materials, and care plan.   If you are experiencing a Mental Health or Behavioral Health Crisis or need someone to talk to, please  call the Suicide and Crisis Lifeline: 988 call the [place]  National Suicide Prevention Lifeline: 906-314-2045 or TTY: 437-697-1827 TTY 920-732-8118) to talk to a trained counselor call 1-800-273-TALK (toll free, 24 hour hotline) go to [facility] [address], [place] [phone]) call the [facility] Crisis Line: [phone] call 911   Pls call/ message for questions,  Danny Mclaren Mckinney Laronn Devonshire, RN, BSN, CCRN Alumnus RN Care Manager  Transitions of Care  [facility] - [facility] [phone]: direct office

## 2024-04-14 NOTE — Telephone Encounter (Signed)
 Copied from CRM [id number]. Topic: Clinical - Home Health Verbal Orders >> Apr 14, 2024  9:26 AM Donna BRAVO wrote: Caller/Agency: [facility] Rushing Number: [phone] Service Requested: Skilled Nursing/ medication management/ Physical Therapy Frequency: start of care date 04/15/24 Any new concerns about the patient? Yes      [facility] will send plan of care  Olam would like a call back regarding patient care.

## 2024-04-15 DIAGNOSIS — I130 Hypertensive heart and chronic kidney disease with heart failure and stage 1 through stage 4 chronic kidney disease, or unspecified chronic kidney disease: Secondary | ICD-10-CM | POA: Diagnosis not present

## 2024-04-15 DIAGNOSIS — N1832 Chronic kidney disease, stage 3b: Secondary | ICD-10-CM | POA: Diagnosis not present

## 2024-04-15 DIAGNOSIS — I5043 Acute on chronic combined systolic (congestive) and diastolic (congestive) heart failure: Secondary | ICD-10-CM | POA: Diagnosis not present

## 2024-04-15 DIAGNOSIS — D630 Anemia in neoplastic disease: Secondary | ICD-10-CM | POA: Diagnosis not present

## 2024-04-15 DIAGNOSIS — I739 Peripheral vascular disease, unspecified: Secondary | ICD-10-CM | POA: Diagnosis not present

## 2024-04-15 DIAGNOSIS — C679 Malignant neoplasm of bladder, unspecified: Secondary | ICD-10-CM | POA: Diagnosis not present

## 2024-04-15 DIAGNOSIS — D696 Thrombocytopenia, unspecified: Secondary | ICD-10-CM | POA: Diagnosis not present

## 2024-04-15 DIAGNOSIS — N179 Acute kidney failure, unspecified: Secondary | ICD-10-CM | POA: Diagnosis not present

## 2024-04-15 DIAGNOSIS — D631 Anemia in chronic kidney disease: Secondary | ICD-10-CM | POA: Diagnosis not present

## 2024-04-16 DIAGNOSIS — I5043 Acute on chronic combined systolic (congestive) and diastolic (congestive) heart failure: Secondary | ICD-10-CM | POA: Diagnosis not present

## 2024-04-16 DIAGNOSIS — D630 Anemia in neoplastic disease: Secondary | ICD-10-CM | POA: Diagnosis not present

## 2024-04-16 DIAGNOSIS — C679 Malignant neoplasm of bladder, unspecified: Secondary | ICD-10-CM | POA: Diagnosis not present

## 2024-04-16 DIAGNOSIS — N179 Acute kidney failure, unspecified: Secondary | ICD-10-CM | POA: Diagnosis not present

## 2024-04-16 DIAGNOSIS — I739 Peripheral vascular disease, unspecified: Secondary | ICD-10-CM | POA: Diagnosis not present

## 2024-04-16 DIAGNOSIS — D696 Thrombocytopenia, unspecified: Secondary | ICD-10-CM | POA: Diagnosis not present

## 2024-04-16 DIAGNOSIS — D631 Anemia in chronic kidney disease: Secondary | ICD-10-CM | POA: Diagnosis not present

## 2024-04-16 DIAGNOSIS — N1832 Chronic kidney disease, stage 3b: Secondary | ICD-10-CM | POA: Diagnosis not present

## 2024-04-16 NOTE — Telephone Encounter (Signed)
 Called patient and LVM letting her know that patient has not been seen with us  in over a year she verbalized she understood and will inform her team about this

## 2024-04-16 NOTE — Telephone Encounter (Signed)
 Needs visit for any hh orders

## 2024-04-18 ENCOUNTER — Telehealth (HOSPITAL_COMMUNITY): Payer: Self-pay

## 2024-04-18 NOTE — Telephone Encounter (Signed)
 Called to confirm/remind patient of their appointment at [facility] on 04/21/24.   Appointment:   [] Confirmed  [] Left mess   [x] No answer/No voice mail  [] VM Full/unable to leave message  [] Phone not in service

## 2024-04-21 ENCOUNTER — Telehealth: Payer: Self-pay | Admitting: Cardiovascular Disease

## 2024-04-21 ENCOUNTER — Encounter (HOSPITAL_COMMUNITY)

## 2024-04-21 NOTE — Telephone Encounter (Signed)
 Returned call from [place] who states that [name], Pa-C signed an order when patient was discharged from the hospital after his valve repair for home health services. Assessment has been completed and patient  has been recommended to be seen by the nurse weekly times 3 and every other week times 2. She states [name] can give a verbal order or she can send a fax for a physical signature.

## 2024-04-21 NOTE — Progress Notes (Incomplete)
 [facility] CLINIC CONSULT NOTE  Primary Care: [name] LABOR, MD Primary Cardiologist: None HF Cardiologist: Dr. Gardenia  HPI: 89 y.o. male with history of CAD w/ prior NSTEMI 1/23 s/p CABG (RIMA to LAD, LIMA to OM1, SVG to OM2), ischemic cardiomyopathy, aortic valve stenosis, hx CVA, PAD, hx bladder cancer, CKD IIIa, LBBB.   EF previously 40-45% on LV gram at time of cath/NSTEMI in 1/23. EF improved to 55-60% on follow-up echo.   Last echo 06/25: EF 50-55%, RWMA w/ basal inferior and basal inferoseptal severe HK, RV okay, moderate AS with mean gradient of 23 mmHg and AVA 1.1 cm2 by VTI.   Admitted 9/25 for acute on chronic HFpEF, acute on chronic anemia, AKI on CKD IIIb and elevated LFTs. Found to have pleural effusions requiring thoracentesis. 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 EF found to be < 20%, with critical AS, down from prior 50-55% in 12/2023. Structural/AHF team have assisted with care. Transiently required milrinone  gtt, and underwent transfemoral TAVR 04/04/24.  Seen by PMT, elected for limited DNR. GDMT titrated and he was discharged home with HH ([facility]), weight 124 lbs.  Today he returns for post hospital HF follow up. Overall feeling fine. Denies increasing SOB, palpitations, abnormal bleeding, CP, dizziness, edema, or PND/Orthopnea. Appetite ok. Weight at home 170 pounds. Taking all medications.   Cardiac Studies      Past Medical History:  Diagnosis Date   (HFpEF) heart failure with preserved ejection fraction (HCC)    Echo 1/23: Inferior AK, mild aortic stenosis (mean 9 mmHg, V-max 210.5 cm/s, DI 0.70), EF 55-60, GR 1 DD, normal RVSF, normal PASP, trivial MR, mild AI, borderline dilation of aortic root (39 mm)   Acid reflux    hiatal hernia   Anemia    Bladder cancer (HCC)    CAD (coronary artery disease)    S/p non-STEMI 1/23 >> s/p CABG   Carotid artery  disease 08/23/2021   Pre-CABG Dopplers 1/23:R ICA 40-59; L ICA 1-39 // Carotid US  07/20/2023: RICA 40-59; LICA 1-39   CHF (congestive heart failure) (HCC)    Chronic kidney disease (CKD)    Coronary artery disease involving native coronary artery of native heart without angina pectoris 05/16/2022   Inf NSTEMI s/p CABG in 07/2021 (RIMA-LAD, LIMA-OM1, S-OM2)   Diverticulitis    Erectile dysfunction 04/20/2015   Essential hypertension 04/20/2015   History of colon polyps    History of kidney stones    History of stroke    Noted on brain MRI 07/2021   Hyperlipidemia 05/28/2015   PAD (peripheral artery disease)    Pre-CABG Dopplers 1/23: Right ABI 0.65; left ABI 0.82   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 Wendel and Dr. Aneita   Skin cancer    Stroke [facility])    noted MRI 2023 pt. unaware    Current Outpatient Medications  Medication Sig Dispense Refill   carvedilol (COREG) 3.125 MG tablet Take 1 tablet (3.125 mg total) by mouth 2 (two) times daily with a meal. 60 tablet 0   furosemide  (LASIX ) 40 MG tablet Take 1 tablet (40 mg total) by mouth daily as needed for fluid or edema. 30 tablet 0   nitroGLYCERIN  (NITROSTAT ) 0.4 MG SL tablet Place 1 tablet (0.4 mg total) under the tongue every 5 (five) minutes as needed for chest pain. 25 tablet  3   rosuvastatin  (CRESTOR ) 10 MG tablet Take 1 tablet (10 mg total) by mouth daily. 30 tablet 0   spironolactone (ALDACTONE) 25 MG tablet Take 1 tablet (25 mg total) by mouth daily. 30 tablet 0   No current facility-administered medications for this visit.    Allergies  Allergen Reactions   Lipitor [Atorvastatin]     Muscle soreness      Social History   Socioeconomic History   Marital status: Married    Spouse name: Not on file   Number of children: 1   Years of education: 12   Highest education level: Not on file  Occupational History   Occupation:  Retired  Tobacco Use   Smoking status: Former    Types: Cigars   Smokeless tobacco: Never   Tobacco comments:    Quit 2016  Vaping Use   Vaping status: Never Used  Substance and Sexual Activity   Alcohol use: Yes    Comment: Occasional beer   Drug use: No   Sexual activity: Not Currently  Other Topics Concern   Not on file  Social History Narrative   Lives with wife and daughter lives with them also/2025   Caffeine use: 1-2 cups coffee in the am and 1 glass tea qpm   Social Drivers of Health   Financial Resource Strain: Medium Risk (11/23/2023)   Overall Financial Resource Strain (CARDIA)    Difficulty of Paying Living Expenses: Somewhat hard  Food Insecurity: No Food Insecurity (04/14/2024)   Hunger Vital Sign    Worried About Running Out of Food in the Last Year: Never true    Ran Out of Food in the Last Year: Never true  Transportation Needs: No Transportation Needs (04/14/2024)   PRAPARE - Administrator, Civil Service (Medical): No    Lack of Transportation (Non-Medical): No  Physical Activity: Inactive (11/23/2023)   Exercise Vital Sign    Days of Exercise per Week: 0 days    Minutes of Exercise per Session: 0 min  Stress: No Stress Concern Present (11/23/2023)   [employer] - Occupational Stress Questionnaire    Feeling of Stress : Only a little  Social Connections: Moderately Isolated (03/26/2024)   Social Connection and Isolation Panel    Frequency of Communication with Friends and Family: Once a week    Frequency of Social Gatherings with Friends and Family: Never    Attends Religious Services: 1 to 4 times per year    Active Member of [employer] or Organizations: No    Attends Banker Meetings: Never    Marital Status: Married  Catering manager Violence: Not At Risk (04/14/2024)   Humiliation, Afraid, Rape, and Kick questionnaire    Fear of Current or Ex-Partner: No    Emotionally Abused: No    Physically Abused:  No    Sexually Abused: No      Family History  Problem Relation Age of Onset   Stroke Father 38   Sudden death Mother 40       unknown cause    There were no vitals filed for this visit.  PHYSICAL EXAM: General:  NAD. No resp difficulty HEENT: Normal Neck: Supple. No JVD. Cor: Regular rate & rhythm. No rubs, gallops or murmurs. Lungs: Clear Abdomen: Soft, nontender, nondistended.  Extremities: No cyanosis, clubbing, rash, edema Neuro: Alert & oriented x 3, moves all 4 extremities w/o difficulty. Affect pleasant.  ECG (personally reviewed):   ASSESSMENT & PLAN: 1.  Chronic systolic CHF with cardiogenic shock, pleural effusions requiring thoracentesis - Echo 12/2023 EF 50-55% w/ reported moderate AS - Echo 03/26/24 on admission, EF <20% with severe AS - Echo 04/05/24 POD1 TAVR, EF up to 35-40% and bioprosthetic aortic valve functioning normally - Low EF at admission may be due to combination of severe AS and LBBB.   - CO-OX stable. Not on inotrope support. Can stop checking. - CVP 5 - Does not need loop diuretic. Can prescribe 40 mg lasix  PRN at discharge.  - Off SGLT2i for now with confirmed UTI - Continue spironolactone 25 mg daily  - Continue coreg 3.125 mg BID - Hesitant to use ARB/ARNi with advanced age and SBP 100s-110s   2. Severe AS - s/p TAVR 04/04/24 - Echo 09/27 with EF up to 35-40% and normal functioning bioprosthetic aortic valve functioning - no antiplatelets 2/2 to anemia/thrombocytopenia   3. CAD - NSTEMI 2023 s/p CABG X 3 - Cardiac CT demonstrated patent RIMA-LAD and LIMA-OM1 as well as an occluded SVG-->OM which was not felt to explain his cardiomyopathy, angiography deferred - Continue rosuvastatin  10 mg daily - No aspirin  with significant anemia and thrombocytopenia.    4. Chronic macrocytic anemia - No overt GI bleeding.  - Hgb averaging in 7s, Transfuse < 7.  - Seen by Heme/Onc, anemia felt to be multifactorial - CBC today   5. CKD IIIa -  Baseline Scr 1.2-1.3, peaked 2.7 in setting of shock.  - Scr now back to baseline - Labs today.   6. Elevated LFTs - Suspect shock liver, LFTs have trended down - US  abdomen with biliary sludge and gallbladder wall thickening   7. ? Afib this admission - EKGs has shown NSR/SB with LBBB, will need to follow conduction as OP  - TSH OK - Not a great candidate for [facility] given anemia/thrombocytopenia.    8. Thrombocytopenia - Plts 106K>>30s and slowly trending down - Heme/Onc felt  thromboctyopenia likely 2/2 severe AS and recent TAVR.  - Defer to Heme/Onc   Follow up in ***

## 2024-04-21 NOTE — Telephone Encounter (Signed)
 Need verbal orders for patient to be seen by the nurse weekly times 3 and every other week times 2. Please advise

## 2024-04-21 NOTE — Telephone Encounter (Signed)
 Called Nena back and read [employer] to [place]. Nena stated understanding.

## 2024-04-21 NOTE — Telephone Encounter (Signed)
 Ok to give verbal order for [facility] as outlined from me. [name], PA-C    04/21/2024 2:40 PM

## 2024-04-22 ENCOUNTER — Telehealth: Payer: Self-pay | Admitting: Hematology

## 2024-04-22 ENCOUNTER — Inpatient Hospital Stay: Attending: Hematology

## 2024-04-22 ENCOUNTER — Other Ambulatory Visit: Payer: Self-pay

## 2024-04-22 ENCOUNTER — Inpatient Hospital Stay: Admitting: Hematology

## 2024-04-22 VITALS — BP 134/58 | HR 97 | Temp 97.8°F | Resp 18 | Ht 64.0 in | Wt 116.0 lb

## 2024-04-22 DIAGNOSIS — N189 Chronic kidney disease, unspecified: Secondary | ICD-10-CM | POA: Insufficient documentation

## 2024-04-22 DIAGNOSIS — D631 Anemia in chronic kidney disease: Secondary | ICD-10-CM | POA: Diagnosis not present

## 2024-04-22 DIAGNOSIS — I130 Hypertensive heart and chronic kidney disease with heart failure and stage 1 through stage 4 chronic kidney disease, or unspecified chronic kidney disease: Secondary | ICD-10-CM | POA: Diagnosis not present

## 2024-04-22 DIAGNOSIS — C679 Malignant neoplasm of bladder, unspecified: Secondary | ICD-10-CM

## 2024-04-22 DIAGNOSIS — D649 Anemia, unspecified: Secondary | ICD-10-CM

## 2024-04-22 DIAGNOSIS — D696 Thrombocytopenia, unspecified: Secondary | ICD-10-CM

## 2024-04-22 DIAGNOSIS — Z79899 Other long term (current) drug therapy: Secondary | ICD-10-CM | POA: Diagnosis not present

## 2024-04-22 LAB — CBC WITH DIFFERENTIAL (CANCER CENTER ONLY)
Abs Immature Granulocytes: 0.04 K/uL (ref 0.00–0.07)
Basophils Absolute: 0 K/uL (ref 0.0–0.1)
Basophils Relative: 0 %
Eosinophils Absolute: 0.1 K/uL (ref 0.0–0.5)
Eosinophils Relative: 1 %
HCT: 22.1 % — ABNORMAL LOW (ref 39.0–52.0)
Hemoglobin: 7.4 g/dL — ABNORMAL LOW (ref 13.0–17.0)
Immature Granulocytes: 1 %
Lymphocytes Relative: 30 %
Lymphs Abs: 1.7 K/uL (ref 0.7–4.0)
MCH: 34.9 pg — ABNORMAL HIGH (ref 26.0–34.0)
MCHC: 33.5 g/dL (ref 30.0–36.0)
MCV: 104.2 fL — ABNORMAL HIGH (ref 80.0–100.0)
Monocytes Absolute: 0.2 K/uL (ref 0.1–1.0)
Monocytes Relative: 4 %
Neutro Abs: 3.5 K/uL (ref 1.7–7.7)
Neutrophils Relative %: 64 %
Platelet Count: 89 K/uL — ABNORMAL LOW (ref 150–400)
RBC: 2.12 MIL/uL — ABNORMAL LOW (ref 4.22–5.81)
RDW: 19.8 % — ABNORMAL HIGH (ref 11.5–15.5)
WBC Count: 5.5 K/uL (ref 4.0–10.5)
nRBC: 0 % (ref 0.0–0.2)

## 2024-04-22 LAB — SAMPLE TO BLOOD BANK

## 2024-04-22 LAB — PREPARE RBC (CROSSMATCH)

## 2024-04-22 NOTE — Telephone Encounter (Signed)
 I spoke with Danny [name] and I informed her of Danny [name]'s appointment scheduled for 10/16.

## 2024-04-22 NOTE — Telephone Encounter (Signed)
 I attempted to inform Danny [name] of his 10/16 Blood appt.

## 2024-04-22 NOTE — Progress Notes (Signed)
 [facility]   Telephone:([phone] Fax:([phone]) [phone]   Clinic Follow up Note   Patient Care Team: [name] LABOR, MD as PCP - General (Internal Medicine) Lanny Callander, MD as Consulting Physician (Hematology) Tousey, Laine M, RN as VBCI Care Management  Date of Service:  04/22/2024  CHIEF COMPLAINT: f/u of anemia and thrombocytopenia  CURRENT THERAPY:  Pending Blood transfusion as needed  Assessment & Plan Anemia of chronic disease, rule out MDS Anemia persists with hemoglobin level of 7.4 despite previous transfusions. Etiology suspected to be multifactorial, especially anemia of chronic disease secondary to heart failure, chronic kidney disease.  Due to his significant thrombocytopenia, primary bone marrow disease such as myelodysplastic syndrome is on the differential. - Schedule 1 unit blood transfusion in the next 2-3 days. - I recommend bone marrow biopsy in the hospital within the next 2-3 weeks.  He agreed. - Administer red blood cell stimulating injection if biopsy results are favorable.  Thrombocytopenia Thrombocytopenia has improved significantly with platelet count increased from 30 to 89 after one dsoe Nplate injection in hospital, indicating positive response to treatment.  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 - Ensure follow-up with cardiology.  Chronic kidney disease Chronic kidney disease may be contributing to anemia.  Plan - Lab reviewed, thrombocytopenia has significantly improved, however he has persistent moderate anemia -Arrange 1 unit blood transfusion in the next few days - I recommend bone marrow biopsy in 2 weeks to rule out MDS and other primary bone marrow disease - Follow-up 1 week after bone marrow biopsy.   Discussed the use of AI scribe software for clinical note transcription with the patient, who gave verbal consent to proceed.  History of Present Illness An 89  year old male with thrombocytopenia presents for follow-up.  His platelet count has improved significantly from 20-30 to 89 since his last hospital visit. He received an injection during his hospital stay, which may have contributed to this improvement. There are no bleeding symptoms, and he feels generally well.  His hemoglobin level was low at 7.4, necessitating a transfusion of two units of blood during his hospital stay. He currently has no bleeding and feels vital. No new symptoms have emerged since the transfusion.     All other systems were reviewed with the patient and are negative.  MEDICAL HISTORY:  Past Medical History:  Diagnosis Date   (HFpEF) heart failure with preserved ejection fraction (HCC)    Echo 1/23: Inferior AK, mild aortic stenosis (mean 9 mmHg, V-max 210.5 cm/s, DI 0.70), EF 55-60, GR 1 DD, normal RVSF, normal PASP, trivial MR, mild AI, borderline dilation of aortic root (39 mm)   Acid reflux    hiatal hernia   Anemia    Bladder cancer (HCC)    CAD (coronary artery disease)    S/p non-STEMI 1/23 >> s/p CABG   Carotid artery disease 08/23/2021   Pre-CABG Dopplers 1/23:R ICA 40-59; L ICA 1-39 // Carotid US  07/20/2023: RICA 40-59; LICA 1-39   CHF (congestive heart failure) (HCC)    Chronic kidney disease (CKD)    Coronary artery disease involving native coronary artery of native heart without angina pectoris 05/16/2022   Inf NSTEMI s/p CABG in 07/2021 (RIMA-LAD, LIMA-OM1, S-OM2)   Diverticulitis    Erectile dysfunction 04/20/2015   Essential hypertension 04/20/2015   History of colon polyps    History of kidney stones    History of stroke  Noted on brain MRI 07/2021   Hyperlipidemia 05/28/2015   PAD (peripheral artery disease)    Pre-CABG Dopplers 1/23: Right ABI 0.65; left ABI 0.82   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 Wendel and Dr. Aneita   Skin cancer     Stroke [facility])    noted MRI 2023 pt. unaware    SURGICAL HISTORY: Past Surgical History:  Procedure Laterality Date   APPENDECTOMY     cataract surgery     CORONARY ARTERY BYPASS GRAFT N/A 08/01/2021   Procedure: CORONARY ARTERY BYPASS GRAFTING (CABG) X 3  ,ON PUMP, USING LEFT AND RIGHT INTERNAL MAMMARY ARTERIES, RIGHT AND LEFT ENDOSCOPIC GREATER SAPHENOUS VEIN CONDUITS;  Surgeon: Lucas Dorise POUR, MD;  Location: [facility] OR;  Service: Open Heart Surgery;  Laterality: N/A;   ENDOVEIN HARVEST OF GREATER SAPHENOUS VEIN Right 08/01/2021   Procedure: ENDOVEIN HARVEST OF GREATER SAPHENOUS VEIN;  Surgeon: Lucas Dorise POUR, MD;  Location: [facility] OR;  Service: Open Heart Surgery;  Laterality: Right;   INTRAOPERATIVE TRANSTHORACIC ECHOCARDIOGRAM N/A 04/04/2024   Procedure: ECHOCARDIOGRAM, TRANSTHORACIC;  Surgeon: Wendel Lurena POUR, MD;  Location: [facility] INVASIVE CV LAB;  Service: Cardiovascular;  Laterality: N/A;   LEFT HEART CATH AND CORONARY ANGIOGRAPHY N/A 07/27/2021   Procedure: LEFT HEART CATH AND CORONARY ANGIOGRAPHY;  Surgeon: Darron Deatrice LABOR, MD;  Location: [facility] INVASIVE CV LAB;  Service: Cardiovascular;  Laterality: N/A;   SKIN CANCER EXCISION     TEE WITHOUT CARDIOVERSION N/A 08/01/2021   Procedure: TRANSESOPHAGEAL ECHOCARDIOGRAM (TEE);  Surgeon: Lucas Dorise POUR, MD;  Location[facility] OR;  Service: Open Heart Surgery;  Laterality: N/A;   TRANSURETHRAL RESECTION OF BLADDER TUMOR N/A 01/03/2023   Procedure: TRANSURETHRAL RESECTION OF BLADDER TUMOR (TURBT);  Surgeon: Devere Lonni Righter, MD;  Location: [facility] ORS;  Service: Urology;  Laterality: N/A;  90 MINUTES   TRANSURETHRAL RESECTION OF BLADDER TUMOR N/A 10/10/2023   Procedure: TURBT (TRANSURETHRAL RESECTION OF BLADDER TUMOR);  Surgeon: Devere Lonni Righter, MD;  Location: [facility] ORS;  Service: Urology;  Laterality: N/A;   TRANSURETHRAL RESECTION OF PROSTATE      I have reviewed the social history and family history with the patient and they are unchanged from  previous note.  ALLERGIES:  is allergic to lipitor [atorvastatin].  MEDICATIONS:  Current Outpatient Medications  Medication Sig Dispense Refill   carvedilol (COREG) 3.125 MG tablet Take 1 tablet (3.125 mg total) by mouth 2 (two) times daily with a meal. 60 tablet 0   furosemide  (LASIX ) 40 MG tablet Take 1 tablet (40 mg total) by mouth daily as needed for fluid or edema. 30 tablet 0   nitroGLYCERIN  (NITROSTAT ) 0.4 MG SL tablet Place 1 tablet (0.4 mg total) under the tongue every 5 (five) minutes as needed for chest pain. 25 tablet 3   rosuvastatin  (CRESTOR ) 10 MG tablet Take 1 tablet (10 mg total) by mouth daily. 30 tablet 0   spironolactone (ALDACTONE) 25 MG tablet Take 1 tablet (25 mg total) by mouth daily. 30 tablet 0   No current facility-administered medications for this visit.    PHYSICAL EXAMINATION: ECOG PERFORMANCE STATUS: 2 - Symptomatic, <50% confined to bed  Vitals:   04/22/24 1433  BP: (!) 134/58  Pulse: 97  Resp: 18  Temp: 97.8 F (36.6 C)  SpO2: 97%   Wt Readings from Last 3 Encounters:  04/22/24 116 lb (52.6 kg)  04/10/24 124 lb 8 oz (  56.5 kg)  01/22/24 128 lb 6.4 oz (58.2 kg)     GENERAL:alert, no distress and comfortable SKIN: skin color, texture, turgor are normal, no rashes or significant lesions EYES: normal, Conjunctiva are pink and non-injected, sclera clear NECK: supple, thyroid  normal size, non-tender, without nodularity LYMPH:  no palpable lymphadenopathy in the cervical, axillary  LUNGS: clear to auscultation and percussion with normal breathing effort HEART: regular rate & rhythm and no murmurs and no lower extremity edema ABDOMEN:abdomen soft, non-tender and normal bowel sounds Musculoskeletal:no cyanosis of digits and no clubbing  NEURO: alert & oriented x 3 with fluent speech, no focal motor/sensory deficits  Physical Exam    LABORATORY DATA:  I have reviewed the data as listed    Latest Ref Rng & Units 04/22/2024    2:06 PM  04/10/2024    5:00 AM 04/09/2024    3:53 AM  CBC  WBC 4.0 - 10.5 K/uL 5.5  7.0  6.8   Hemoglobin 13.0 - 17.0 g/dL 7.4  7.2  7.3   Hematocrit 39.0 - 52.0 % 22.1  21.2  21.5   Platelets 150 - 400 K/uL 89  30  30         Latest Ref Rng & Units 04/10/2024    5:00 AM 04/09/2024    3:53 AM 04/08/2024   10:24 AM  CMP  Glucose 70 - 99 mg/dL 79  90  846   BUN 8 - 23 mg/dL 30  34  28   Creatinine 0.61 - 1.24 mg/dL 8.69  8.69  8.68   Sodium 135 - 145 mmol/L 130  130  128   Potassium 3.5 - 5.1 mmol/L 4.7  4.5  3.9   Chloride 98 - 111 mmol/L 102  103  97   CO2 22 - 32 mmol/L 24  21  23    Calcium  8.9 - 10.3 mg/dL 8.0  7.9  7.8   Total Protein 6.5 - 8.1 g/dL   5.0   Total Bilirubin 0.0 - 1.2 mg/dL   0.4   Alkaline Phos 38 - 126 U/L   61   AST 15 - 41 U/L   25   ALT 0 - 44 U/L   10       RADIOGRAPHIC STUDIES: I have personally reviewed the radiological images as listed and agreed with the findings in the report. No results found.    Orders Placed This Encounter  Procedures   CT BONE MARROW BIOPSY & ASPIRATION    Standing Status:   Future    Expected Date:   05/06/2024    Expiration Date:   04/22/2025    Reason for Exam (SYMPTOM  OR DIAGNOSIS REQUIRED):   anemia and throumbocytopenia, rule out primary marrow disease    Preferred location?:   [facility]   Informed Consent Details: Physician/Practitioner Attestation; Transcribe to consent form and obtain patient signature    Physician/Practitioner attestation of informed consent for blood and or blood product transfusion:   I, the physician/practitioner, attest that I have discussed with the patient the benefits, risks, side effects, alternatives, likelihood of achieving goals and potential problems during recovery for the procedure that I have provided informed consent.    Product(s):   All Product(s)   All questions were answered. The patient knows to call the clinic with any problems, questions or concerns. No barriers to  learning was detected. The total time spent in the appointment was 30 minutes, including review of chart and various tests results,  discussions about plan of care and coordination of care plan     Onita Mattock, MD 04/22/2024

## 2024-04-23 ENCOUNTER — Other Ambulatory Visit: Payer: Self-pay | Admitting: *Deleted

## 2024-04-23 ENCOUNTER — Telehealth: Payer: Self-pay | Admitting: Physician Assistant

## 2024-04-23 ENCOUNTER — Other Ambulatory Visit: Payer: Self-pay

## 2024-04-23 NOTE — Telephone Encounter (Signed)
 Spoke to kate, verbal order given for PT.

## 2024-04-23 NOTE — Transitions of Care (Post Inpatient/ED Visit) (Signed)
 Transition of Care week 2/ day # 9  Visit Note  04/23/2024  Name: Danny [name] MRN: [id number]          DOB: Nov 13, 1933  Situation: Patient enrolled in [facility] 30-day program. Visit completed with patient by telephone.   HIPAA identifiers x 2 verified  Background:  Recent Hospitalization for treatment of CHF with subsequent surgical TAVR September 16- April 10, 2024 Independent at baseline; using walker post-hospital discharge- does not use at baseline; resides with supportive spouse and adult daughter- assisting with care needs as indicated Vehicle currently in shop: using the Gisele to get around  Denies need for additional resources for transportation Baseline very poor cell reception per patient report: he requests that all subsequent calls be placed to home land line: [phone]: (1) unplanned hospital admission x last (6)/ (12) months  Initial Transition Care Management Follow-up Telephone Call Discharge Date and Diagnosis: 04/10/24, CHF exacerbation with subsequent surgical TAVR   Past Medical History:  Diagnosis Date   (HFpEF) heart failure with preserved ejection fraction (HCC)    Echo 1/23: Inferior AK, mild aortic stenosis (mean 9 mmHg, V-max 210.5 cm/s, DI 0.70), EF 55-60, GR 1 DD, normal RVSF, normal PASP, trivial MR, mild AI, borderline dilation of aortic root (39 mm)   Acid reflux    hiatal hernia   Anemia    Bladder cancer (HCC)    CAD (coronary artery disease)    S/p non-STEMI 1/23 >> s/p CABG   Carotid artery disease 08/23/2021   Pre-CABG Dopplers 1/23:R ICA 40-59; L ICA 1-39 // Carotid US  07/20/2023: RICA 40-59; LICA 1-39   CHF (congestive heart failure) (HCC)    Chronic kidney disease (CKD)    Coronary artery disease involving native coronary artery of native heart without angina pectoris 05/16/2022   Inf NSTEMI s/p CABG in 07/2021 (RIMA-LAD, LIMA-OM1, S-OM2)   Diverticulitis    Erectile dysfunction 04/20/2015   Essential hypertension 04/20/2015   History  of colon polyps    History of kidney stones    History of stroke    Noted on brain MRI 07/2021   Hyperlipidemia 05/28/2015   PAD (peripheral artery disease)    Pre-CABG Dopplers 1/23: Right ABI 0.65; left ABI 0.82   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 Wendel and Dr. Aneita   Skin cancer    Stroke [facility])    noted MRI 2023 pt. unaware   Assessment:  I forgot you were calling today, was laying down taking a nap.  Went to cancer center yesterday and going back tomorrow for a blood transfusion; still using Gisele to get where I need to go, I am good at using it, I went to the appointment by myself yesterday.  Not using the walker as much, feeling stronger.  I forgot about the heart doctor visit, I will re-schedule it.  I am feeling like my normal self again and not having any problems or concerns.  I will eventually get around to scheduling an appointment with Dr. Rollene- I figure there is no big rush on it    Denies clinical concerns and sounds to be in no distress throughout [facility] 30-day program outreach call today  Patient Reported Symptoms: Cognitive Cognitive Status: Normal speech and language skills, Alert and oriented to person, place, and time, Difficulties with attention and concentration, Insightful and able to interpret abstract concepts Cognitive/Intellectual Conditions Management [RPT]: None reported or  documented in medical history or problem list      Neurological Neurological Review of Symptoms: No symptoms reported    HEENT HEENT Symptoms Reported: No symptoms reported      Cardiovascular Cardiovascular Symptoms Reported: No symptoms reported, Other: Other Cardiovascular Symptoms: Confirmed has still not obtained scales for daily weight monitoring: reports family went out today to get me scales; Denies shortness of breath, sweling; feel much better than I did last week and am pretty  much back to my normal self  Reports weight at oncology provider office visit yesterday was where it always is: 116 lbs Reports I missed the heart doctor appointment, I just completely forgot about it Encouraged patient to promptly re-schedule: states he will do as soon as we hang up Does patient have uncontrolled Hypertension?: No Cardiovascular Management Strategies: Coping strategies, Routine screening, Medication therapy  Respiratory Respiratory Symptoms Reported: No symptoms reported Other Respiratory Symptoms: Denies shortness of breath and sounds to be in no respiratory distress throughout TOC call Respiratory Management Strategies: Routine screening, Adequate rest, Coping strategies  Endocrine Endocrine Symptoms Reported: No symptoms reported Is patient diabetic?: No    Gastrointestinal Gastrointestinal Symptoms Reported: No symptoms reported Additional Gastrointestinal Details: Reports last BM normal, today      Genitourinary Genitourinary Symptoms Reported: No symptoms reported    Integumentary Integumentary Symptoms Reported: No symptoms reported, Wound Additional Integumentary Details: Reports where they did the surgery is all healed up Skin Management Strategies: Routine screening, Coping strategies, Adequate rest  Musculoskeletal Musculoskelatal Symptoms Reviewed: Limited mobility Additional Musculoskeletal Details: confirmed currently requiring/ using assistive devices for ambulation - walker: reports using prn now: I don't have to use it as much, I feel like I am getting stronger        Psychosocial Psychosocial Symptoms Reported: No symptoms reported, Difficulty concentrating (Patient requires frequent re-directing during TOC call) Behavioral Management Strategies: Support system, Coping strategies Major Change/Loss/Stressor/Fears (CP): Medical condition, self Techniques to Cope with Loss/Stress/Change: Diversional activities     There were no vitals filed  for this visit.  Medications Reviewed Today     Reviewed by Bettye Sitton M, RN (Registered Nurse) on 04/23/24 at 1402  Med List Status: <None>   Medication Order Taking? Sig Documenting Provider Last Dose Status Informant  carvedilol (COREG) 3.125 MG tablet [id number]  Take 1 tablet (3.125 mg total) by mouth 2 (two) times daily with a meal. Cindy Garnette POUR, MD  Active   furosemide  (LASIX ) 40 MG tablet [id number]  Take 1 tablet (40 mg total) by mouth daily as needed for fluid or edema. Cindy Garnette POUR, MD  Active   nitroGLYCERIN  (NITROSTAT ) 0.4 MG SL tablet [id number]  Place 1 tablet (0.4 mg total) under the tongue every 5 (five) minutes as needed for chest pain. Lelon Hamilton T, PA-C  Active Self  rosuvastatin  (CRESTOR ) 10 MG tablet [id number]  Take 1 tablet (10 mg total) by mouth daily. Cindy Garnette POUR, MD  Active   spironolactone (ALDACTONE) 25 MG tablet [id number]  Take 1 tablet (25 mg total) by mouth daily. Cindy Garnette POUR, MD  Active            Recommendation:   PCP Follow-up- advised patient to promptly schedule- he continues to decline assistance with scheduling for hospital follow up office visit Specialty provider follow-up- advised patient to promptly re-schedule missed cardiology provider office visit, originally scheduled for 04/21/24; confirmed plans to attend oncology provider office visit for scheduled blood transfusion on 04/24/24 Continue Current Plan of Care  Follow Up Plan:   Telephone follow-up in 1 week- as scheduled 05/01/24  Pls call/ message for questions,  Ferrin Liebig Mckinney Rianna Lukes, RN, BSN, CCRN Alumnus RN Care Manager  Transitions of Care  [facility] - [facility] [phone]: direct office

## 2024-04-23 NOTE — Telephone Encounter (Signed)
 Home health nurse calling for verbal orders for physical therapy, once a week for 5 weeks. Please advise.

## 2024-04-23 NOTE — Patient Instructions (Signed)
 Visit Information  Thank you for taking time to visit with me today. Please don't hesitate to contact me if I can be of assistance to you before our next scheduled telephone appointment.  Our next appointment is by telephone on Thursday, May 01, 2024 at 2:15 pm with nurse Turkey and then again on Wednesday, May 07, 2024 at 2:00 pm with nurse Beatris  Please call the care guide team at [phone] if you need to cancel or reschedule your appointment.   Following are the goals we discussed today:  Patient Self Care Activities:  Attend all scheduled provider appointments Call provider office for new concerns or questions  Participate in Transition of Care Program/Attend TOC scheduled calls Take medications as prescribed   call office if I gain more than 2 pounds in one day or 5 pounds in one week track weight in diary use salt in moderation watch for swelling in feet, ankles and legs every day weigh myself daily follow rescue plan if symptoms flare-up Please re-schedule your missed cardiology provider office visit as soon as possible: [phone] Continue working with the home health team that is involved in your care Continue pacing activity as your recuperation from recent surgery continues Use assistive devices as needed to prevent falls- your walker Weigh yourself every day to stay on top of early fluid retention: write down your weights every day so you remember what it is from day to day: follow the weight-gain guidelines and action plan to call your doctor if you gain more than 3 lbs overnight, or 5 lbs in one week Take all recorded weights from home monitoring to upcoming cardiology provider office visit for your doctor to review If you believe your condition is getting worse- contact your care providers (doctors) promptly- reaching out to your doctor early when you have concerns can prevent you from having to go to the hospital  If you are experiencing a Mental Health or  Behavioral Health Crisis or need someone to talk to, please  call the Suicide and Crisis Lifeline: 988 call the [place]  National Suicide Prevention Lifeline: 737-576-1099 or TTY: (979) 649-6800 TTY 478 555 7931) to talk to a trained counselor call 1-800-273-TALK (toll free, 24 hour hotline) go to [facility] [address], [place] [phone]) call the [facility] Crisis Line: (830)671-2884 call 911   Patient verbalizes understanding of instructions and care plan provided today and agrees to view in MyChart. Active MyChart status and patient understanding of how to access instructions and care plan via MyChart confirmed with patient.     Pls call/ message for questions,  Schwanda Zima Mckinney Joshva Labreck, RN, BSN, CCRN Alumnus RN Care Manager  Transitions of Care  [facility] - [facility] [phone]: direct office

## 2024-04-24 ENCOUNTER — Inpatient Hospital Stay

## 2024-04-24 ENCOUNTER — Telehealth: Payer: Self-pay | Admitting: Hematology

## 2024-04-24 DIAGNOSIS — I130 Hypertensive heart and chronic kidney disease with heart failure and stage 1 through stage 4 chronic kidney disease, or unspecified chronic kidney disease: Secondary | ICD-10-CM | POA: Diagnosis not present

## 2024-04-24 DIAGNOSIS — D649 Anemia, unspecified: Secondary | ICD-10-CM

## 2024-04-24 DIAGNOSIS — C679 Malignant neoplasm of bladder, unspecified: Secondary | ICD-10-CM

## 2024-04-24 MED ORDER — SODIUM CHLORIDE 0.9% IV SOLUTION
250.0000 mL | INTRAVENOUS | Status: DC
  Administered 2024-04-24: 250 mL via INTRAVENOUS

## 2024-04-24 NOTE — Telephone Encounter (Signed)
 Alex is scheduled to see Dr. Lanny on 10/29. He has been contacted and he is aware.

## 2024-04-24 NOTE — Patient Instructions (Signed)

## 2024-04-25 DIAGNOSIS — I5043 Acute on chronic combined systolic (congestive) and diastolic (congestive) heart failure: Secondary | ICD-10-CM | POA: Diagnosis not present

## 2024-04-25 DIAGNOSIS — D696 Thrombocytopenia, unspecified: Secondary | ICD-10-CM | POA: Diagnosis not present

## 2024-04-25 DIAGNOSIS — I130 Hypertensive heart and chronic kidney disease with heart failure and stage 1 through stage 4 chronic kidney disease, or unspecified chronic kidney disease: Secondary | ICD-10-CM | POA: Diagnosis not present

## 2024-04-25 DIAGNOSIS — N179 Acute kidney failure, unspecified: Secondary | ICD-10-CM | POA: Diagnosis not present

## 2024-04-25 DIAGNOSIS — C679 Malignant neoplasm of bladder, unspecified: Secondary | ICD-10-CM | POA: Diagnosis not present

## 2024-04-25 DIAGNOSIS — I739 Peripheral vascular disease, unspecified: Secondary | ICD-10-CM | POA: Diagnosis not present

## 2024-04-25 DIAGNOSIS — D631 Anemia in chronic kidney disease: Secondary | ICD-10-CM | POA: Diagnosis not present

## 2024-04-25 DIAGNOSIS — D630 Anemia in neoplastic disease: Secondary | ICD-10-CM | POA: Diagnosis not present

## 2024-04-25 DIAGNOSIS — N1832 Chronic kidney disease, stage 3b: Secondary | ICD-10-CM | POA: Diagnosis not present

## 2024-04-25 LAB — BPAM RBC
Blood Product Expiration Date: 202511022359
ISSUE DATE / TIME: 202510161410
Unit Type and Rh: 6200

## 2024-04-25 LAB — TYPE AND SCREEN
ABO/RH(D): A POS
Antibody Screen: NEGATIVE
Unit division: 0

## 2024-04-28 ENCOUNTER — Other Ambulatory Visit

## 2024-04-28 ENCOUNTER — Ambulatory Visit: Admitting: Hematology

## 2024-04-29 ENCOUNTER — Other Ambulatory Visit: Payer: Self-pay | Admitting: Radiology

## 2024-04-29 DIAGNOSIS — D696 Thrombocytopenia, unspecified: Secondary | ICD-10-CM

## 2024-04-29 NOTE — H&P (Incomplete)
 Chief Complaint: Anemia and thrombocytopenia; referred for image guided bone marrow biopsy to rule out primary marrow disease  Referring Provider(s): Feng,Y  Supervising Physician: Hughes Simmonds  Patient Status: [facility] - Out-pt  History of Present Illness: Danny [name] is an 89 y.o. male with past medical history of heart failure, GERD, bladder cancer, coronary artery disease with prior MI/CABG, carotid artery disease, chronic kidney disease, diverticulitis, hypertension, colon polyps, nephrolithiasis, prior stroke, hyperlipidemia, peripheral arterial disease, prior TAVR, skin cancer as well as anemia of chronic disease and thrombocytopenia.  He is scheduled today for image guided bone marrow biopsy to rule out myelodysplastic syndrome.  *** Patient is Full Code  Past Medical History:  Diagnosis Date   (HFpEF) heart failure with preserved ejection fraction (HCC)    Echo 1/23: Inferior AK, mild aortic stenosis (mean 9 mmHg, V-max 210.5 cm/s, DI 0.70), EF 55-60, GR 1 DD, normal RVSF, normal PASP, trivial MR, mild AI, borderline dilation of aortic root (39 mm)   Acid reflux    hiatal hernia   Anemia    Bladder cancer (HCC)    CAD (coronary artery disease)    S/p non-STEMI 1/23 >> s/p CABG   Carotid artery disease 08/23/2021   Pre-CABG Dopplers 1/23:R ICA 40-59; L ICA 1-39 // Carotid US  07/20/2023: RICA 40-59; LICA 1-39   CHF (congestive heart failure) (HCC)    Chronic kidney disease (CKD)    Coronary artery disease involving native coronary artery of native heart without angina pectoris 05/16/2022   Inf NSTEMI s/p CABG in 07/2021 (RIMA-LAD, LIMA-OM1, S-OM2)   Diverticulitis    Erectile dysfunction 04/20/2015   Essential hypertension 04/20/2015   History of colon polyps    History of kidney stones    History of stroke    Noted on brain MRI 07/2021   Hyperlipidemia 05/28/2015   PAD (peripheral artery disease)    Pre-CABG Dopplers 1/23: Right ABI 0.65; left ABI 0.82   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 Wendel and Dr. Aneita   Skin cancer    Stroke [facility]    noted MRI 2023 pt. unaware    Past Surgical History:  Procedure Laterality Date   APPENDECTOMY     cataract surgery     CORONARY ARTERY BYPASS GRAFT N/A 08/01/2021   Procedure: CORONARY ARTERY BYPASS GRAFTING (CABG) X 3  ,ON PUMP, USING LEFT AND RIGHT INTERNAL MAMMARY ARTERIES, RIGHT AND LEFT ENDOSCOPIC GREATER SAPHENOUS VEIN CONDUITS;  Surgeon: Lucas Dorise POUR, MD;  Location: [facility] OR;  Service: Open Heart Surgery;  Laterality: N/A;   ENDOVEIN HARVEST OF GREATER SAPHENOUS VEIN Right 08/01/2021   Procedure: ENDOVEIN HARVEST OF GREATER SAPHENOUS VEIN;  Surgeon: Lucas Dorise POUR, MD;  Location: [facility] OR;  Service: Open Heart Surgery;  Laterality: Right;   INTRAOPERATIVE TRANSTHORACIC ECHOCARDIOGRAM N/A 04/04/2024   Procedure: ECHOCARDIOGRAM, TRANSTHORACIC;  Surgeon: Wendel Lurena POUR, MD;  Location: [facility] INVASIVE CV LAB;  Service: Cardiovascular;  Laterality: N/A;   LEFT HEART CATH AND CORONARY ANGIOGRAPHY N/A 07/27/2021   Procedure: LEFT HEART CATH AND CORONARY ANGIOGRAPHY;  Surgeon: Darron Deatrice LABOR, MD;  Location: [facility] INVASIVE CV LAB;  Service: Cardiovascular;  Laterality: N/A;   SKIN CANCER EXCISION     TEE WITHOUT CARDIOVERSION N/A 08/01/2021   Procedure: TRANSESOPHAGEAL ECHOCARDIOGRAM (TEE);  Surgeon: Lucas Dorise POUR, MD;  Location: [facility] OR;  Service: Open Heart Surgery;  Laterality: N/A;   TRANSURETHRAL  RESECTION OF BLADDER TUMOR N/A 01/03/2023   Procedure: TRANSURETHRAL RESECTION OF BLADDER TUMOR (TURBT);  Surgeon: Devere Lonni Righter, MD;  Location: [facility] ORS;  Service: Urology;  Laterality: N/A;  90 MINUTES   TRANSURETHRAL RESECTION OF BLADDER TUMOR N/A 10/10/2023   Procedure: TURBT (TRANSURETHRAL RESECTION OF BLADDER TUMOR);  Surgeon: Devere Lonni Righter, MD;  Location: [facility] ORS;  Service: Urology;  Laterality: N/A;    TRANSURETHRAL RESECTION OF PROSTATE      Allergies: Lipitor [atorvastatin]  Medications: Prior to Admission medications   Medication Sig Start Date End Date Taking? Authorizing Provider  carvedilol (COREG) 3.125 MG tablet Take 1 tablet (3.125 mg total) by mouth 2 (two) times daily with a meal. 04/10/24 05/10/24  Cindy Garnette POUR, MD  furosemide  (LASIX ) 40 MG tablet Take 1 tablet (40 mg total) by mouth daily as needed for fluid or edema. 04/10/24   Cindy Garnette POUR, MD  nitroGLYCERIN  (NITROSTAT ) 0.4 MG SL tablet Place 1 tablet (0.4 mg total) under the tongue every 5 (five) minutes as needed for chest pain. 05/16/22 03/26/25  Lelon Hamilton T, PA-C  rosuvastatin  (CRESTOR ) 10 MG tablet Take 1 tablet (10 mg total) by mouth daily. 04/11/24   Cindy Garnette POUR, MD  spironolactone (ALDACTONE) 25 MG tablet Take 1 tablet (25 mg total) by mouth daily. 04/11/24   Cindy Garnette POUR, MD     Family History  Problem Relation Age of Onset   Stroke Father 45   Sudden death Mother 40       unknown cause    Social History   Socioeconomic History   Marital status: Married    Spouse name: Not on file   Number of children: 1   Years of education: 12   Highest education level: Not on file  Occupational History   Occupation: Retired  Tobacco Use   Smoking status: Former    Types: Cigars   Smokeless tobacco: Never   Tobacco comments:    Quit 2016  Vaping Use   Vaping status: Never Used  Substance and Sexual Activity   Alcohol use: Yes    Comment: Occasional beer   Drug use: No   Sexual activity: Not Currently  Other Topics Concern   Not on file  Social History Narrative   Lives with wife and daughter lives with them also/2025   Caffeine use: 1-2 cups coffee in the am and 1 glass tea qpm   Social Drivers of Health   Financial Resource Strain: Medium Risk (11/23/2023)   Overall Financial Resource Strain (CARDIA)    Difficulty of Paying Living Expenses: Somewhat hard  Food Insecurity: No Food  Insecurity (04/14/2024)   Hunger Vital Sign    Worried About Running Out of Food in the Last Year: Never true    Ran Out of Food in the Last Year: Never true  Transportation Needs: No Transportation Needs (04/14/2024)   PRAPARE - Administrator, Civil Service (Medical): No    Lack of Transportation (Non-Medical): No  Physical Activity: Inactive (11/23/2023)   Exercise Vital Sign    Days of Exercise per Week: 0 days    Minutes of Exercise per Session: 0 min  Stress: No Stress Concern Present (11/23/2023)   [employer] - Occupational Stress Questionnaire    Feeling of Stress : Only a little  Social Connections: Moderately Isolated (03/26/2024)   Social Connection and Isolation Panel    Frequency of Communication with Friends and Family: Once a week  Frequency of Social Gatherings with Friends and Family: Never    Attends Religious Services: 1 to 4 times per year    Active Member of Clubs or Organizations: No    Attends Banker Meetings: Never    Marital Status: Married       Review of Systems  Vital Signs:   Advance Care Plan: No documents on file    Physical Exam  Imaging: ECHOCARDIOGRAM COMPLETE Result Date: 04/05/2024    ECHOCARDIOGRAM REPORT   Patient Name:   Danny [name] Date of Exam: 04/05/2024 Medical Rec #:  [id number]      Height:       64.0 in Accession #:    [id number]     Weight:       121.3 lb Date of Birth:  [birth year]     BSA:          1.582 m Patient Age:    89 years       BP:           122/70 mmHg Patient Gender: M              HR:           77 bpm. Exam Location:  Inpatient Procedure: 2D Echo, 3D Echo, Cardiac Doppler, Color Doppler and Strain Analysis            (Both Spectral and Color Flow Doppler were utilized during            procedure). Indications:    Post TAVR evaluation V43.3 / Z95.2  History:        Patient has prior history of Echocardiogram examinations, most                 recent 04/04/2024. CHF,  CAD and Previous Myocardial Infarction,                 Prior CABG, Arrythmias:Bradycardia; Risk Factors:Hypertension                 and Dyslipidemia. Chronic kidney disease, stage 3a (HCC), S/P                 TAVR (transcatheter aortic valve replacement).                 Aortic Valve: 23 mm Sapien prosthetic, stented (TAVR) valve is                 present in the aortic position. Procedure Date: 04/04/24.  Sonographer:    Aida Pizza RCS Referring Phys: [id number] [facility] R THOMPSON  Sonographer Comments: Global longitudinal strain was attempted. IMPRESSIONS  1. S/P TAVR with mean gradient 10 mmHg and trace AI.  2. Left ventricular ejection fraction, by estimation, is 35 to 40%. The left ventricle has moderately decreased function. The left ventricle demonstrates global hypokinesis. Left ventricular diastolic parameters are consistent with Grade II diastolic dysfunction (pseudonormalization). The average left ventricular global longitudinal strain is -8.7 %. The global longitudinal strain is abnormal.  3. Right ventricular systolic function is normal. The right ventricular size is normal.  4. Left atrial size was severely dilated.  5. Right atrial size was severely dilated.  6. Moderate pleural effusion in the left lateral region.  7. The mitral valve is normal in structure. Mild mitral valve regurgitation. No evidence of mitral stenosis.  8. The aortic valve has been repaired/replaced. Aortic valve regurgitation is trivial. No aortic stenosis is present. There is a 23 mm Sapien prosthetic (TAVR)  valve present in the aortic position. Procedure Date: 04/04/24.  9. The inferior vena cava is normal in size with greater than 50% respiratory variability, suggesting right atrial pressure of 3 mmHg. FINDINGS  Left Ventricle: Left ventricular ejection fraction, by estimation, is 35 to 40%. The left ventricle has moderately decreased function. The left ventricle demonstrates global hypokinesis. The average left ventricular  global longitudinal strain is -8.7 %.  Strain was performed and the global longitudinal strain is abnormal. The left ventricular internal cavity size was normal in size. There is no left ventricular hypertrophy. Left ventricular diastolic parameters are consistent with Grade II diastolic dysfunction (pseudonormalization). Right Ventricle: The right ventricular size is normal. Right ventricular systolic function is normal. Left Atrium: Left atrial size was severely dilated. Right Atrium: Right atrial size was severely dilated. Pericardium: There is no evidence of pericardial effusion. Mitral Valve: The mitral valve is normal in structure. Mild mitral annular calcification. Mild mitral valve regurgitation. No evidence of mitral valve stenosis. Tricuspid Valve: The tricuspid valve is normal in structure. Tricuspid valve regurgitation is mild . No evidence of tricuspid stenosis. Aortic Valve: The aortic valve has been repaired/replaced. Aortic valve regurgitation is trivial. No aortic stenosis is present. Aortic valve mean gradient measures 10.5 mmHg. Aortic valve peak gradient measures 20.3 mmHg. Aortic valve area, by VTI measures 1.43 cm. There is a 23 mm Sapien prosthetic, stented (TAVR) valve present in the aortic position. Procedure Date: 04/04/24. Pulmonic Valve: The pulmonic valve was normal in structure. Pulmonic valve regurgitation is not visualized. No evidence of pulmonic stenosis. Aorta: The aortic root is normal in size and structure. Venous: The inferior vena cava is normal in size with greater than 50% respiratory variability, suggesting right atrial pressure of 3 mmHg. IAS/Shunts: No atrial level shunt detected by color flow Doppler. Additional Comments: S/P TAVR with mean gradient 10 mmHg and trace AI. 3D was performed not requiring image post processing on an independent workstation and was abnormal. There is a moderate pleural effusion in the left lateral region.  LEFT VENTRICLE PLAX 2D LVIDd:          5.00 cm   Diastology LVIDs:         3.80 cm   LV e' medial:    4.79 cm/s LV PW:         0.90 cm   LV E/e' medial:  22.3 LV IVS:        1.00 cm   LV e' lateral:   10.00 cm/s LVOT diam:     2.00 cm   LV E/e' lateral: 10.7 LV SV:         62 LV SV Index:   39        2D Longitudinal Strain LVOT Area:     3.14 cm  2D Strain GLS Avg:     -8.7 %                           3D Volume EF:                          3D EF:        44 %                          LV EDV:       173 ml  LV ESV:       97 ml                          LV SV:        76 ml RIGHT VENTRICLE RV S prime:     13.50 cm/s TAPSE (M-mode): 1.8 cm LEFT ATRIUM             Index        RIGHT ATRIUM           Index LA diam:        4.10 cm 2.59 cm/m   RA Area:     23.80 cm LA Vol (A2C):   69.0 ml 43.63 ml/m  RA Volume:   81.30 ml  51.41 ml/m LA Vol (A4C):   76.1 ml 48.12 ml/m LA Biplane Vol: 75.6 ml 47.80 ml/m  AORTIC VALVE AV Area (Vmax):    1.49 cm AV Area (Vmean):   1.44 cm AV Area (VTI):     1.43 cm AV Vmax:           225.50 cm/s AV Vmean:          149.000 cm/s AV VTI:            0.438 m AV Peak Grad:      20.3 mmHg AV Mean Grad:      10.5 mmHg LVOT Vmax:         107.00 cm/s LVOT Vmean:        68.350 cm/s LVOT VTI:          0.198 m LVOT/AV VTI ratio: 0.45  AORTA Ao Root diam: 3.30 cm MITRAL VALVE MV Area (PHT): 4.60 cm     SHUNTS MV Decel Time: 165 msec     Systemic VTI:  0.20 m MV E velocity: 107.00 cm/s  Systemic Diam: 2.00 cm MV A velocity: 86.40 cm/s MV E/A ratio:  1.24 Redell Shallow MD Electronically signed by Redell Shallow MD Signature Date/Time: 04/05/2024/1:03:26 PM    Final    Structural Heart Procedure Result Date: 04/04/2024 See surgical note for result.  DG CHEST PORT 1 VIEW Result Date: 04/02/2024 CLINICAL DATA:  Bilateral pleural effusions. EXAM: PORTABLE CHEST 1 VIEW COMPARISON:  Radiograph and CT yesterday FINDINGS: Stable right upper extremity PICC tip in the SVC. Unchanged cardiomegaly post median sternotomy  and CABG. Trace bilateral pleural effusions. Scattered atelectasis. Improving ground-glass opacities in the upper lung zones. No pneumothorax. IMPRESSION: 1. Trace bilateral pleural effusions. 2. Improving ground-glass opacities in the upper lung zones. Electronically Signed   By: Andrea Gasman M.D.   On: 04/02/2024 12:50   CT ANGIO CHEST AORTA W/CM & OR WO/CM Result Date: 04/01/2024 CLINICAL DATA:  Preoperative evaluation for aortic valve replacement. History of bladder cancer. * Tracking Code: BO * EXAM: CT ANGIOGRAPHY CHEST, ABDOMEN AND PELVIS TECHNIQUE: Multidetector CT imaging through the chest, abdomen and pelvis was performed using the standard protocol during bolus administration of intravenous contrast. Multiplanar reconstructed images and MIPs were obtained and reviewed to evaluate the vascular anatomy. RADIATION DOSE REDUCTION: This exam was performed according to the departmental dose-optimization program which includes automated exposure control, adjustment of the mA and/or kV according to patient size and/or use of iterative reconstruction technique. CONTRAST:  OMNIPAQUE  IOHEXOL  350 MG/ML SOLN COMPARISON:  CT abdomen and pelvis dated 10/19/2022 FINDINGS: CTA CHEST FINDINGS Cardiovascular: Right upper extremity PICC tip terminates in the lower SVC. Preferential opacification of the thoracic  aorta. No evidence of thoracic aortic aneurysm or dissection. Irregular noncalcified atherosclerotic plaque without substantial luminal narrowing along the descending thoracic aorta. No pericardial effusion. Coronary artery calcifications. Reflux of contrast material into the hepatic veins, suggesting a degree of right heart dysfunction. Please see separately dictated cardiac CT report for detailed findings. Mediastinum/Nodes: Imaged thyroid  gland without nodules meeting criteria for imaging follow-up by size. Normal esophagus. No pathologically enlarged axillary, supraclavicular, mediastinal, or hilar  lymph nodes. Lungs/Pleura: The central airways are patent. Mild interlobular septal thickening with interspersed areas of ground-glass opacities in the bilateral upper lobes. Near-complete relaxation atelectasis of the bilateral lower lobes. No pneumothorax. Pleural effusions. Musculoskeletal: Median sternotomy wires are nondisplaced. No acute or abnormal lytic or blastic osseous lesions. Review of the MIP images confirms the above findings. CTA ABDOMEN AND PELVIS FINDINGS VASCULAR Aorta: Aortic atherosclerosis. Irregular noncalcified atherosclerotic plaque results in moderate luminal narrowing of the juxtarenal and infrarenal abdominal aorta. No aneurysm or dissection. Celiac: Patent without evidence of aneurysm, dissection, vasculitis or significant stenosis. Segmental mild to moderate luminal narrowing of the splenic artery due to calcified plaque. SMA: Segmental mild-to-moderate luminal narrowing of the SMA due to atherosclerotic plaque. Replaced hepatic artery arises from the SMA. Renals: Single bilateral renal arteries. Mild luminal narrowing of the proximal arteries due to atherosclerotic plaque. IMA: Severe luminal narrowing of the IMA origin. Inflow: Patent without evidence of aneurysm, dissection, vasculitis or significant stenosis. Proximal Outflow: Bilateral common femoral and visualized portions of the superficial and profunda femoral arteries are patent without evidence of aneurysm, dissection, vasculitis or significant stenosis. Veins: No obvious venous abnormality within the limitations of this arterial phase study. Review of the MIP images confirms the above findings. NON-VASCULAR Hepatobiliary: No focal hepatic lesions. No intra or extrahepatic biliary ductal dilation. Normal gallbladder. Pancreas: No focal lesions or main ductal dilation. Spleen: Normal in size without focal abnormality. Adrenals/Urinary Tract: No adrenal nodules. Again seen are multiple bilateral renal cysts, including  peripherally calcified irregular structure arising exophytically from the lower pole left kidney with an adjacent intermediate attenuation cyst (6:132). Some of the cysts are simple while others are hemorrhagic/proteinaceous. No hydronephrosis or calculi. Underdistended urinary bladder contains anti dependent intraluminal gas as well as multiple scattered punctate foci of gas along the posterior urinary bladder. There is irregular mural thickening along the right bladder wall with a small nodular focus protruding into the bladder lumen measuring 7 mm (6:177). A mass in this area previously measured 2.1 cm. Stomach/Bowel: Normal appearance of the stomach. No evidence of bowel wall thickening, distention, or inflammatory changes. Appendix is not discretely seen. Lymphatic: No enlarged abdominal or pelvic lymph nodes. Reproductive: Prostate is unremarkable. Other: Small volume presacral free fluid. No free air or fluid collection. Musculoskeletal: No acute or abnormal lytic or blastic osseous lesions. Diffuse body wall edema. Review of the MIP images confirms the above findings. IMPRESSION: 1. Irregular noncalcified atherosclerotic plaque results in moderate luminal narrowing of the juxtarenal and infrarenal abdominal aorta. No aneurysm or dissection. 2. Mild interlobular septal thickening with interspersed areas of ground-glass opacities in the bilateral upper lobes, likely pulmonary edema. 3. Bilateral large pleural effusions with near-complete relaxation atelectasis of the bilateral lower lobes. 4. Irregular mural thickening along the right bladder wall with a small nodular focus protruding into the bladder lumen measuring 7 mm. A mass in this area previously measured 2.1 cm. Findings are consistent with known bladder cancer. 5. Intraluminal gas within the urinary bladder including scattered punctate foci along the posterior bladder  wall. Recommend correlation with urinalysis as these findings may reflect  emphysematous cystitis, as well as history of recent instrumentation. 6. Multiple bilateral renal cysts, some of which are simple while others are hemorrhagic/proteinaceous. These are not substantially changed in size compared to 10/19/2022 and better evaluated on prior CT dated 11/29/2022. 7. Reflux of contrast material into the hepatic veins, suggesting a degree of right heart dysfunction. 8.  Aortic Atherosclerosis (ICD10-I70.0). Electronically Signed   By: Limin  Xu M.D.   On: 04/01/2024 18:28   CT Angio Abd/Pel w/ and/or w/o Result Date: 04/01/2024 CLINICAL DATA:  Preoperative evaluation for aortic valve replacement. History of bladder cancer. * Tracking Code: BO * EXAM: CT ANGIOGRAPHY CHEST, ABDOMEN AND PELVIS TECHNIQUE: Multidetector CT imaging through the chest, abdomen and pelvis was performed using the standard protocol during bolus administration of intravenous contrast. Multiplanar reconstructed images and MIPs were obtained and reviewed to evaluate the vascular anatomy. RADIATION DOSE REDUCTION: This exam was performed according to the departmental dose-optimization program which includes automated exposure control, adjustment of the mA and/or kV according to patient size and/or use of iterative reconstruction technique. CONTRAST:  OMNIPAQUE  IOHEXOL  350 MG/ML SOLN COMPARISON:  CT abdomen and pelvis dated 10/19/2022 FINDINGS: CTA CHEST FINDINGS Cardiovascular: Right upper extremity PICC tip terminates in the lower SVC. Preferential opacification of the thoracic aorta. No evidence of thoracic aortic aneurysm or dissection. Irregular noncalcified atherosclerotic plaque without substantial luminal narrowing along the descending thoracic aorta. No pericardial effusion. Coronary artery calcifications. Reflux of contrast material into the hepatic veins, suggesting a degree of right heart dysfunction. Please see separately dictated cardiac CT report for detailed findings. Mediastinum/Nodes: Imaged  thyroid  gland without nodules meeting criteria for imaging follow-up by size. Normal esophagus. No pathologically enlarged axillary, supraclavicular, mediastinal, or hilar lymph nodes. Lungs/Pleura: The central airways are patent. Mild interlobular septal thickening with interspersed areas of ground-glass opacities in the bilateral upper lobes. Near-complete relaxation atelectasis of the bilateral lower lobes. No pneumothorax. Pleural effusions. Musculoskeletal: Median sternotomy wires are nondisplaced. No acute or abnormal lytic or blastic osseous lesions. Review of the MIP images confirms the above findings. CTA ABDOMEN AND PELVIS FINDINGS VASCULAR Aorta: Aortic atherosclerosis. Irregular noncalcified atherosclerotic plaque results in moderate luminal narrowing of the juxtarenal and infrarenal abdominal aorta. No aneurysm or dissection. Celiac: Patent without evidence of aneurysm, dissection, vasculitis or significant stenosis. Segmental mild to moderate luminal narrowing of the splenic artery due to calcified plaque. SMA: Segmental mild-to-moderate luminal narrowing of the SMA due to atherosclerotic plaque. Replaced hepatic artery arises from the SMA. Renals: Single bilateral renal arteries. Mild luminal narrowing of the proximal arteries due to atherosclerotic plaque. IMA: Severe luminal narrowing of the IMA origin. Inflow: Patent without evidence of aneurysm, dissection, vasculitis or significant stenosis. Proximal Outflow: Bilateral common femoral and visualized portions of the superficial and profunda femoral arteries are patent without evidence of aneurysm, dissection, vasculitis or significant stenosis. Veins: No obvious venous abnormality within the limitations of this arterial phase study. Review of the MIP images confirms the above findings. NON-VASCULAR Hepatobiliary: No focal hepatic lesions. No intra or extrahepatic biliary ductal dilation. Normal gallbladder. Pancreas: No focal lesions or main ductal  dilation. Spleen: Normal in size without focal abnormality. Adrenals/Urinary Tract: No adrenal nodules. Again seen are multiple bilateral renal cysts, including peripherally calcified irregular structure arising exophytically from the lower pole left kidney with an adjacent intermediate attenuation cyst (6:132). Some of the cysts are simple while others are hemorrhagic/proteinaceous. No hydronephrosis or calculi. Underdistended urinary  bladder contains anti dependent intraluminal gas as well as multiple scattered punctate foci of gas along the posterior urinary bladder. There is irregular mural thickening along the right bladder wall with a small nodular focus protruding into the bladder lumen measuring 7 mm (6:177). A mass in this area previously measured 2.1 cm. Stomach/Bowel: Normal appearance of the stomach. No evidence of bowel wall thickening, distention, or inflammatory changes. Appendix is not discretely seen. Lymphatic: No enlarged abdominal or pelvic lymph nodes. Reproductive: Prostate is unremarkable. Other: Small volume presacral free fluid. No free air or fluid collection. Musculoskeletal: No acute or abnormal lytic or blastic osseous lesions. Diffuse body wall edema. Review of the MIP images confirms the above findings. IMPRESSION: 1. Irregular noncalcified atherosclerotic plaque results in moderate luminal narrowing of the juxtarenal and infrarenal abdominal aorta. No aneurysm or dissection. 2. Mild interlobular septal thickening with interspersed areas of ground-glass opacities in the bilateral upper lobes, likely pulmonary edema. 3. Bilateral large pleural effusions with near-complete relaxation atelectasis of the bilateral lower lobes. 4. Irregular mural thickening along the right bladder wall with a small nodular focus protruding into the bladder lumen measuring 7 mm. A mass in this area previously measured 2.1 cm. Findings are consistent with known bladder cancer. 5. Intraluminal gas within the  urinary bladder including scattered punctate foci along the posterior bladder wall. 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 6. Multiple bilateral renal cysts, some of which are simple while others are hemorrhagic/proteinaceous. These are not substantially changed in size compared to 10/19/2022 and better evaluated on prior CT dated 11/29/2022. 7. Reflux of contrast material into the hepatic veins, suggesting a degree of right heart dysfunction. 8.  Aortic Atherosclerosis (ICD10-I70.0). Electronically Signed   By: Limin  Xu M.D.   On: 04/01/2024 18:28   DG CHEST PORT 1 VIEW Result Date: 04/01/2024 CLINICAL DATA:  Pleural effusion.  Post bilateral thoracentesis. EXAM: PORTABLE CHEST 1 VIEW COMPARISON:  Chest radiograph 03/25/2024.  CT chest 04/01/2024 FINDINGS: Postoperative changes in the mediastinum. Mild cardiac enlargement. No vascular congestion or edema. Minimal bilateral or pleural effusions representing interval decreased since prior CT post thoracentesis. No pneumothorax. Mediastinal contours appear intact. A right PICC line is in place with tip projecting over the low SVC region. Calcification in the aorta. Probable emphysematous changes in the lungs. Peribronchial thickening with perihilar infiltrates likely representing chronic bronchitis. IMPRESSION: 1. Minimal if any residual pleural effusions post thoracentesis. No pneumothorax. 2. Mild cardiac enlargement. 3. 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 Electronically Signed   By: Elsie Gravely M.D.   On: 04/01/2024 18:17   CT CORONARY MORPH W/CTA COR W/SCORE W/CA W/CM &/OR WO/CM Addendum Date: 04/01/2024 ADDENDUM REPORT: 04/01/2024 14:26 EXAM: OVER-READ INTERPRETATION  CT CHEST The following report is an over-read performed by radiologist Dr. Marcey Diones [facility] on 04/01/2024. This over-read does not include  interpretation of cardiac or coronary anatomy or pathology. The coronary CTA interpretation by the cardiologist is attached. COMPARISON:  None. FINDINGS: The heart size is within normal limits. No pericardial fluid is identified. Visualized segments of the thoracic aorta and central pulmonary arteries are normal in caliber. Visualized mediastinum and hilar regions demonstrate no lymphadenopathy or masses. Pulmonary edema with moderate bilateral pleural effusions. Associated compressive atelectasis of both lower lobes. Visualized no pulmonary consolidation, pneumothorax or focal nodule. Visualized upper abdomen and bony structures are unremarkable. IMPRESSION: Pulmonary edema with moderate bilateral pleural effusions. Electronically Signed  By: Marcey Moan M.D.   On: 04/01/2024 14:26   Result Date: 04/01/2024 CLINICAL DATA:  Aortic valve replacement (TAVR), pre-op eval [id number] Severe aortic stenosis [id number] EXAM: Cardiac TAVR CT TECHNIQUE: The patient was scanned on a Siemens Force 192 slice scanner. A 120 kV retrospective scan was triggered in the descending thoracic aorta at 111 HU's. Gantry rotation speed was 270 msecs and collimation was .9 mm. The 3D data set was reconstructed in 5% intervals of the R-R cycle. Systolic and diastolic phases were analyzed on a dedicated work station using MPR, MIP and VRT modes. The patient received 100mL OMNIPAQUE  IOHEXOL  350 MG/ML SOLN of contrast. 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 Severely thickened and severely calcified aortic valve cusps. AV calcium  score: 1859 Virtual Basal Annulus Measurements: Maximum/Minimum Diameter: 27.5 x 20.2 mm Perimeter: 74.8 mm Area:  423 mm2 No significant LVOT calcifications. Membranous septal length: 8.4 mm Based on these measurements, the annulus would be suitable for a 23 mm Sapien 3 valve. Alternatively, Heart Team can consider 29 mm Evolut valve. Recommend Heart Team discussion for  valve selection. Sinus of Valsalva Measurements: Non-coronary:  35 mm Right - coronary:  34 mm Left - coronary:  34 mm Coronary height and sinus of Valsalva Height: Left main: 22 mm, Left sinus: 26 mm Right coronary: 21 mm, Right sinus: 26 mm Aorta: Common origin of the brachiocephalic and left common carotid artery. Severe aortic atherosclerosis with very severe atheromatous plaque in the infrarenal abdominal aorta. Sinotubular Junction:  31 mm Ascending Thoracic Aorta:  37 mm Aortic Arch:  30 mm Descending Thoracic Aorta:  27 mm Coronary Arteries: Normal coronary origin. Right dominance. The study was performed without use of NTG and insufficient for plaque evaluation. Coronary artery calcium  score not performed due to prior CABG. RIMA-LAD patent. LIMA-OM1 patent. SVG to OM2 appears occluded at the aortic anastomosis. Optimum Fluoroscopic Angle for Delivery: LAO 5 CAU 3 OTHER: Large bilateral pleural effusions. Atria: Biatrial dilation. Left atrial appendage: No thrombus. Mitral valve: Grossly normal, mild mitral annular calcifications. Pulmonary artery: Normal caliber. Pulmonary veins: Normal anatomy. IMPRESSION: 1. 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 Severely thickened and severely calcified aortic valve cusps. 2. Aortic valve calcium  score: 1859 3. Annulus area: 423 mm2, suitable for 23 mm Sapien 3 valve. No LVOT calcifications. Membranous septal length 8.4 mm. 4. Sufficient coronary artery heights from annulus. 5. Optimum fluoroscopic angle for delivery: LAO 5 CAU 3 6. Patent RIMA-LAD, LIMA-OM1.  Occluded SVG to OM2. 7. Severe aortic atherosclerosis with very severe atheromatous plaque in the infrarenal abdominal aorta. 8. Large bilateral pleural effusions. Electronically Signed: By: Soyla Merck M.D. On: 04/01/2024 14:07   ECHOCARDIOGRAM LIMITED Result Date: 03/31/2024    TRANSESOPHOGEAL ECHO REPORT   Patient Name:   Danny [name] Date of Exam: 03/31/2024 Medical Rec #:   [id number]      Height:       64.0 in Accession #:    [id number]     Weight:       128.5 lb Date of Birth:  09/23/1933     BSA:          1.621 m Patient Age:    89 years       BP:           123/66 mmHg Patient Gender: M              HR:  79 bpm. Exam Location:  Inpatient Procedure: Limited Echo, Color Doppler and Cardiac Doppler (Both Spectral and            Color Flow Doppler were utilized during procedure). Indications:    Aortic Stenosis  History:        Patient has prior history of Echocardiogram examinations, most                 recent 03/26/2024.  Sonographer:    Tinnie Gosling RDCS Referring Phys: [id number] GLENDIA DASEN WEAVER IMPRESSIONS  1. Left ventricular ejection fraction, by estimation, is 20%. The left ventricle has severely decreased function. The left ventricle demonstrates global hypokinesis. The left ventricular internal cavity size was mildly dilated. No LV thrombus noted.  2. Right ventricular systolic function is mildly reduced. The right ventricular size is normal. Tricuspid regurgitation signal is inadequate for assessing PA pressure.  3. Left atrial size was mildly dilated.  4. The mitral valve is degenerative. Moderate mitral valve regurgitation. No evidence of mitral stenosis. Moderate mitral annular calcification.  5. The aortic valve is tricuspid. There is severe calcifcation of the aortic valve. Aortic valve regurgitation is mild. Aortic valve mean gradient measures 19.0 mmHg. Visually, I suspect low flow/low gradient severe aortic stenosis. However, no LVOT VTI  measurement was made, unable to calculate aortic valve area. Would suggest repeat study with fully interrogation of the aortic valve.  6. The inferior vena cava is normal in size with <50% respiratory variability, suggesting right atrial pressure of 8 mmHg.  7. Left pleural effusion present. FINDINGS  Left Ventricle: Left ventricular ejection fraction, by estimation, is 20%. The left ventricle has severely decreased function. The  left ventricle demonstrates global hypokinesis. The left ventricular internal cavity size was mildly dilated. Right Ventricle: The right ventricular size is normal. No increase in right ventricular wall thickness. Right ventricular systolic function is mildly reduced. Tricuspid regurgitation signal is inadequate for assessing PA pressure. Left Atrium: Left atrial size was mildly dilated. Right Atrium: Right atrial size was normal in size. Pericardium: Left pleural effusion present. There is no evidence of pericardial effusion. Mitral Valve: The mitral valve is degenerative in appearance. There is mild calcification of the mitral valve leaflet(s). Moderate mitral annular calcification. Moderate mitral valve regurgitation. No evidence of mitral valve stenosis. Tricuspid Valve: The tricuspid valve is not assessed. Aortic Valve: The aortic valve is tricuspid. There is severe calcifcation of the aortic valve. Aortic valve regurgitation is mild. Aortic valve mean gradient measures 19.0 mmHg. Aortic valve peak gradient measures 32.7 mmHg. Pulmonic Valve: The pulmonic valve was not assessed. Aorta: The aortic root is normal in size and structure. Venous: The inferior vena cava is normal in size with less than 50% respiratory variability, suggesting right atrial pressure of 8 mmHg. IAS/Shunts: No atrial level shunt detected by color flow Doppler.  LEFT VENTRICLE PLAX 2D LVIDd:         5.30 cm LVIDs:         4.80 cm LV PW:         0.90 cm LV IVS:        0.90 cm LVOT diam:     2.10 cm LVOT Area:     3.46 cm  LV Volumes (MOD) LV vol d, MOD A2C: 162.0 ml LV vol d, MOD A4C: 160.0 ml LV vol s, MOD A2C: 132.0 ml LV vol s, MOD A4C: 124.0 ml LV SV MOD A2C:     30.0 ml LV SV MOD A4C:  160.0 ml LV SV MOD BP:      34.3 ml IVC IVC diam: 1.80 cm LEFT ATRIUM         Index LA diam:    4.10 cm 2.53 cm/m  AORTIC VALVE AV Vmax:      286.00 cm/s AV Vmean:     206.000 cm/s AV VTI:       0.549 m AV Peak Grad: 32.7 mmHg AV Mean Grad: 19.0  mmHg  AORTA Ao Root diam: 3.50 cm MITRAL VALVE MV Area (PHT): 6.12 cm    SHUNTS MV Decel Time: 124 msec    Systemic Diam: 2.10 cm MV E velocity: 92.00 cm/s MV A velocity: 70.40 cm/s MV E/A ratio:  1.31 Dalton McleanMD Electronically signed by Ezra Kanner Signature Date/Time: 03/31/2024/9:04:33 PM    Final     Labs:  CBC: Recent Labs    04/08/24 0333 04/09/24 0353 04/10/24 0500 04/22/24 1406  WBC 6.8 6.8 7.0 5.5  HGB 7.5* 7.3* 7.2* 7.4*  HCT 22.4* 21.5* 21.2* 22.1*  PLT 33* 30* 30* 89*    COAGS: Recent Labs    04/03/24 2105 04/07/24 1015  INR 1.2 1.2  APTT  --  34    BMP: Recent Labs    04/07/24 0515 04/08/24 1024 04/09/24 0353 04/10/24 0500  NA 130* 128* 130* 130*  K 4.5 3.9 4.5 4.7  CL 101 97* 103 102  CO2 23 23 21* 24  GLUCOSE 90 153* 90 79  BUN 33* 28* 34* 30*  CALCIUM  7.8* 7.8* 7.9* 8.0*  CREATININE 1.35* 1.31* 1.30* 1.30*  GFRNONAA 51* 53* 53* 53*    LIVER FUNCTION TESTS: Recent Labs    04/01/24 0650 04/03/24 0434 04/06/24 0251 04/08/24 1024  BILITOT 1.1 1.1 0.7 0.4  AST 38 26 19 25   ALT 86* 50* 9 10  ALKPHOS 83 71 60 61  PROT 5.9* 5.2* 5.0* 5.0*  ALBUMIN  2.6* 2.3* 2.2* 2.2*    TUMOR MARKERS: No results for input(s): AFPTM, CEA, CA199, CHROMGRNA in the last 8760 hours.  Assessment and Plan: 89 y.o. male with past medical history of heart failure, GERD, bladder cancer, coronary artery disease with prior MI/CABG, carotid artery disease, chronic kidney disease, diverticulitis, hypertension, colon polyps, nephrolithiasis, prior stroke, hyperlipidemia, peripheral arterial disease, prior TAVR, skin cancer as well as anemia of chronic disease and thrombocytopenia.  He is scheduled today for image guided bone marrow biopsy to rule out myelodysplastic syndrome.Risks and benefits of procedure was discussed with the patient including, but not limited to bleeding, infection, damage to adjacent structures or low yield requiring additional  tests.  All of the questions were answered and there is agreement to proceed.  Consent signed and in chart.    Thank you for allowing our service to participate in Danny [name] 's care.  Electronically Signed: D. Franky Rakers, PA-C   04/29/2024, 3:52 PM      I spent a total of  20 minutes   in face to face in clinical consultation, greater than 50% of which was counseling/coordinating care for image guided bone marrow biopsy i

## 2024-04-30 ENCOUNTER — Ambulatory Visit (HOSPITAL_COMMUNITY)

## 2024-04-30 ENCOUNTER — Ambulatory Visit (HOSPITAL_COMMUNITY)
Admission: RE | Admit: 2024-04-30 | Discharge: 2024-04-30 | Disposition: A | Discharge status: Home / Self Care | Source: Ambulatory Visit | Attending: Hematology | Admitting: Hematology

## 2024-04-30 DIAGNOSIS — I130 Hypertensive heart and chronic kidney disease with heart failure and stage 1 through stage 4 chronic kidney disease, or unspecified chronic kidney disease: Secondary | ICD-10-CM | POA: Diagnosis not present

## 2024-04-30 DIAGNOSIS — N1832 Chronic kidney disease, stage 3b: Secondary | ICD-10-CM | POA: Diagnosis not present

## 2024-04-30 DIAGNOSIS — I739 Peripheral vascular disease, unspecified: Secondary | ICD-10-CM | POA: Diagnosis not present

## 2024-04-30 DIAGNOSIS — D696 Thrombocytopenia, unspecified: Secondary | ICD-10-CM | POA: Diagnosis not present

## 2024-04-30 DIAGNOSIS — D630 Anemia in neoplastic disease: Secondary | ICD-10-CM | POA: Diagnosis not present

## 2024-04-30 DIAGNOSIS — D631 Anemia in chronic kidney disease: Secondary | ICD-10-CM | POA: Diagnosis not present

## 2024-05-01 ENCOUNTER — Other Ambulatory Visit: Payer: Self-pay

## 2024-05-01 ENCOUNTER — Telehealth: Payer: Self-pay

## 2024-05-01 LAB — ECHOCARDIOGRAM LIMITED
AR max vel: 1.94 cm2
AV Area VTI: 2.25 cm2
AV Area mean vel: 2.07 cm2
AV Mean grad: 3 mmHg
AV Peak grad: 5.9 mmHg
Ao pk vel: 1.21 m/s
Est EF: 30

## 2024-05-02 ENCOUNTER — Telehealth: Payer: Self-pay

## 2024-05-02 NOTE — Transitions of Care (Post Inpatient/ED Visit) (Signed)
 Transition of Care week 4  Visit Note  05/02/2024  Name: Danny [name] MRN: [id number]          DOB: 21-Jul-1933  Situation: Patient enrolled in [facility] 30-day program. Visit completed with daughter states patient at home spoke with patient by landline telephone.   Background:   Initial Transition Care Management Follow-up Telephone Call Discharge Date and Diagnosis: 04/10/24, CHF exacerbation with subsequent surgical TAVR   Past Medical History:  Diagnosis Date   (HFpEF) heart failure with preserved ejection fraction (HCC)    Echo 1/23: Inferior AK, mild aortic stenosis (mean 9 mmHg, V-max 210.5 cm/s, DI 0.70), EF 55-60, GR 1 DD, normal RVSF, normal PASP, trivial MR, mild AI, borderline dilation of aortic root (39 mm)   Acid reflux    hiatal hernia   Anemia    Bladder cancer (HCC)    CAD (coronary artery disease)    S/p non-STEMI 1/23 >> s/p CABG   Carotid artery disease 08/23/2021   Pre-CABG Dopplers 1/23:R ICA 40-59; L ICA 1-39 // Carotid US  07/20/2023: RICA 40-59; LICA 1-39   CHF (congestive heart failure) (HCC)    Chronic kidney disease (CKD)    Coronary artery disease involving native coronary artery of native heart without angina pectoris 05/16/2022   Inf NSTEMI s/p CABG in 07/2021 (RIMA-LAD, LIMA-OM1, S-OM2)   Diverticulitis    Erectile dysfunction 04/20/2015   Essential hypertension 04/20/2015   History of colon polyps    History of kidney stones    History of stroke    Noted on brain MRI 07/2021   Hyperlipidemia 05/28/2015   PAD (peripheral artery disease)    Pre-CABG Dopplers 1/23: Right ABI 0.65; left ABI 0.82   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 Wendel and Dr. Aneita   Skin cancer    Stroke [facility])    noted MRI 2023 pt. unaware    Assessment: Patient Reported Symptoms: Cognitive Cognitive Status: Alert and oriented to person, place, and time, Difficulties with  attention and concentration, Normal speech and language skills      Neurological Neurological Review of Symptoms: No symptoms reported    HEENT HEENT Symptoms Reported: No symptoms reported (States he hears better out of left ear) HEENT Self-Management Outcome: 4 (good)    Cardiovascular Cardiovascular Symptoms Reported: No symptoms reported Does patient have uncontrolled Hypertension?: No Cardiovascular Management Strategies: Coping strategies, Medication therapy, Routine screening Weight: 115 lb (52.2 kg) Cardiovascular Self-Management Outcome: 3 (uncertain) Cardiovascular Comment: seems like I'm losing weight - to contact cardiology about Furosemide  being taken as needed to clarify  Respiratory Respiratory Symptoms Reported: No symptoms reported    Endocrine Endocrine Symptoms Reported: No symptoms reported    Gastrointestinal Gastrointestinal Symptoms Reported: No symptoms reported      Genitourinary Genitourinary Symptoms Reported: No symptoms reported    Integumentary Integumentary Symptoms Reported: No symptoms reported    Musculoskeletal Musculoskelatal Symptoms Reviewed: Limited mobility Additional Musculoskeletal Details: confirmed currently requiring/ using assistive devices for ambulation - walker: reports using prn now: I don't have to use it as much, I feel like I am getting stronger, cooking and assisting wife Musculoskeletal Management Strategies: Medication therapy, Medical device, Routine screening      Psychosocial Psychosocial Symptoms Reported: No symptoms reported, Difficulty concentrating         There were no vitals filed for this visit.  Medications Reviewed Today     Reviewed  by Eilleen Richerd GRADE, RN (Registered Nurse) on 05/02/24 at 1141  Med List Status: <None>   Medication Order Taking? Sig Documenting Provider Last Dose Status Informant  carvedilol (COREG) 3.125 MG tablet [id number] Yes Take 1 tablet (3.125 mg total) by mouth 2 (two) times  daily with a meal. Cindy Garnette POUR, MD  Active   furosemide  (LASIX ) 40 MG tablet [id number] Yes Take 1 tablet (40 mg total) by mouth daily as needed for fluid or edema. Cindy Garnette POUR, MD  Active   nitroGLYCERIN  (NITROSTAT ) 0.4 MG SL tablet [id number] Yes Place 1 tablet (0.4 mg total) under the tongue every 5 (five) minutes as needed for chest pain. Lelon Hamilton T, PA-C  Active Self  rosuvastatin  (CRESTOR ) 10 MG tablet [id number] Yes Take 1 tablet (10 mg total) by mouth daily. Cindy Garnette POUR, MD  Active   spironolactone (ALDACTONE) 25 MG tablet [id number] Yes Take 1 tablet (25 mg total) by mouth daily. Cindy Garnette POUR, MD  Active            Care gaps were reviewed with patient today and patient verbalized understanding to follow up with primary care provider during office visits and discuss any concerns.  Patient's last flu vaccine was January 2025 and encouraged to follow up with PCP.  Recommendation:   Continue Current Plan of Care Continue to follow up appointments in MyChart  Follow Up Plan:   Telephone follow-up in 1 week with Laine Tousey on 05/07/24 patient states he knows it's on his MyChart papers.  Richerd Eilleen, RN, BSN, CCM [facility], [facility] Management Coordinator Direct Dial: [phone]

## 2024-05-02 NOTE — Patient Instructions (Addendum)
 Visit Information  Thank you for taking time to visit with me today. Please don't hesitate to contact me if I can be of assistance to you before our next scheduled telephone appointment.  Our next appointment is by telephone on 05/07/24 at 1400  Following is a copy of your care plan:   Goals Addressed             This Visit's Progress    [facility] Transitions of Care (TOC) Care Plan   Improving    Problems:  Recent Hospitalization for treatment of CHF with subsequent surgical TAVR September 16- April 10, 2024 Independent at baseline; using walker post-hospital discharge- does not use at baseline; resides with supportive spouse and adult daughter- assisting with care needs as indicated 04/23/24: reports using walker only prn now: I feel like I am getting stronger Vehicle currently in shop: using the [employer] to get around 04/23/24: confirms still independently using [employer] for transportation: reports this is no problem, I am good at using the [employer] Denies need for additional resources for transportation today 05/02/24 not to drive, they use [employer]le though [place], he says due to no working vehicle Baseline very poor cell reception per patient report: he requests that all subsequent calls be placed to home land line: [phone]: (1) unplanned hospital admission x last (6)/ (12) months 05/02/24 Patient feels his scale is not very accurate  Goal:  Over the next 30 days, the patient will not experience hospital readmission  Interventions:  Transitions of Care: week # 2/ day # 9 Discussed current clinical condition:  I forgot you were calling today, was laying down taking a nap.  Went to cancer center yesterday and going back tomorrow for a blood transfusion; still using [employer] to get where I need to go, I am good at using it, I went to the appointment by myself yesterday.  Not using the walker as much, feeling stronger.  I forgot about the heart doctor visit, I will re-schedule it.  I am feeling  like my normal self again and not having any problems or concerns.  I will eventually get around to scheduling an appointment with Dr. Rollene- I figure there is no big rush on it  Denies clinical concerns and sounds to be in no distress throughout [facility] 30-day program outreach call today Durable Medical Equipment (DME) needs assessed with patient/caregiver Doctor Visits  - discussed the importance of doctor visits Post discharge activity limitations prescribed by provider reviewed Post-op wound/incision care reviewed with patient/caregiver Reviewed Signs and symptoms of infection Confirmed patient has still not scheduled hospital follow up with PCP: again encouraged him to do so- he again declines my offer with assistance in scheduling- reports he wishes to self-schedule 05/02/24 Patient states he is seeing so many physicians right now and haven't made the appointment. Confirmed home health services remain active: last visit today- she left just a little while ago ([facility]); Provided reinforcement around role of home health services with importance of participation/ ongoing engagement 05/02/24 HH still coming out the nurse was out yesterday. Confirmed currently requiring/ using assistive devices for ambulation - walker post- op- using prn now; provided education/ reinforcement around fall prevention  Provided education/ reinforcement around benefit of conservative post-op activity; need to pace activity without over-doing  Reviewed oncology provider office visit on 04/22/24 with patient- verbalizes good understanding of same; denies questions/ concerns post-recent provider office visits Reviewed upcoming provider office visits: 04/24/24: oncology/ anemia provider- for scheduled blood transfusion; confirmed patient is aware of  all and has plans to attend as scheduled Strongly encouraged patient to promptly re-schedule missed cardiology provider office visit scheduled 04/21/24: he reports he has  the number and will call to self-schedule- declines/ denies need for assistance with scheduling when offered  Heart Failure Interventions: Basic overview and discussion of pathophysiology of Heart Failure reviewed Reviewed role of diuretics in prevention of fluid overload and management of heart failure; Discussed the importance of keeping all appointments with provider Confirmed has still not obtained scales for daily weight monitoring: reports family went out today to get me scales; Denies shortness of breath, sweling; feel much better than I did last week and am pretty much back to my normal self Reports weight at oncology provider office visit yesterday was where it always is: 116 lbs  05/02/24 Reviewed with patient - Reinforced previously provided education around rationale for daily weight monitoring at home along with weight gain guidelines/ action plan for weight gain; importance of taking diuretic as prescribed advised patient to take all recorded weights from home monitoring- once he starts home monitoring-- to cardiology provider office visit for provider review, once he gets this office visit scheduled  Patient Self Care Activities:  Attend all scheduled provider appointments Call provider office for new concerns or questions  Participate in Transition of Care Program/Attend TOC scheduled calls Take medications as prescribed   call office if I gain more than 2 pounds in one day or 5 pounds in one week track weight in diary use salt in moderation watch for swelling in feet, ankles and legs every day weigh myself daily follow rescue plan if symptoms flare-up Please re-schedule your missed cardiology provider office visit as soon as possible: [phone] 05/02/24 - Patient missed his biopsy yesterday, daughter is re-scheduling appointment Continue working with the home health team that is involved in your care Continue pacing activity as your recuperation from recent surgery  continues Use assistive devices as needed to prevent falls- your walker Weigh yourself every day to stay on top of early fluid retention: write down your weights every day so you remember what it is from day to day: follow the weight-gain guidelines and action plan to call your doctor if you gain more than 3 lbs overnight, or 5 lbs in one week Take all recorded weights from home monitoring to upcoming cardiology provider office visit for your doctor to review If you believe your condition is getting worse- contact your care providers (doctors) promptly- reaching out to your doctor early when you have concerns can prevent you from having to go to the hospital 05/02/24 Patient to follow up with cardiologist regarding Furosemide  parameters patient states he has just been taking it, then he read the bottle to this writer and states, I better talk to my cardiologist about it also at my appointment. Explained to patient regarding weight gain over 2 - 3 lbs over night or 5 pounds in a week but he states, I feel like my scale is not correct and will have my daughter get me a digital one.  Plan:  05/02/24 Reminded patient- Telephone follow up appointment with care management team member scheduled for:  Wednesday 05/07/24 with Laine Tousey, RN he states he sees it on his MyChart papers.        Patient verbalizes understanding of instructions and care plan provided today and agrees to view in MyChart. Active MyChart status and patient understanding of how to access instructions and care plan via MyChart confirmed with patient.  The patient has been provided with contact information for the care management team and has been advised to call with any health related questions or concerns.  Follow up with provider re: your biopsy you missed 05/01/24 patient states daughter has re-scheduled for May 16, 2024 he believes  Please call the care guide team at [phone] if you need to cancel or reschedule  your appointment.   Please call the [place]  National Suicide Prevention Lifeline: (667)706-5420 or TTY: 364-816-9904 TTY 281 271 9855) to talk to a trained counselor if you are experiencing a Mental Health or Behavioral Health Crisis or need someone to talk to.  Richerd Fish, RN, BSN, CCM [facility] Management Coordinator Direct Dial: [phone]

## 2024-05-06 DIAGNOSIS — N1832 Chronic kidney disease, stage 3b: Secondary | ICD-10-CM | POA: Diagnosis not present

## 2024-05-06 DIAGNOSIS — D696 Thrombocytopenia, unspecified: Secondary | ICD-10-CM | POA: Diagnosis not present

## 2024-05-06 DIAGNOSIS — I130 Hypertensive heart and chronic kidney disease with heart failure and stage 1 through stage 4 chronic kidney disease, or unspecified chronic kidney disease: Secondary | ICD-10-CM | POA: Diagnosis not present

## 2024-05-06 DIAGNOSIS — I739 Peripheral vascular disease, unspecified: Secondary | ICD-10-CM | POA: Diagnosis not present

## 2024-05-06 DIAGNOSIS — I5043 Acute on chronic combined systolic (congestive) and diastolic (congestive) heart failure: Secondary | ICD-10-CM | POA: Diagnosis not present

## 2024-05-06 DIAGNOSIS — D631 Anemia in chronic kidney disease: Secondary | ICD-10-CM | POA: Diagnosis not present

## 2024-05-06 DIAGNOSIS — N179 Acute kidney failure, unspecified: Secondary | ICD-10-CM | POA: Diagnosis not present

## 2024-05-06 DIAGNOSIS — C679 Malignant neoplasm of bladder, unspecified: Secondary | ICD-10-CM | POA: Diagnosis not present

## 2024-05-07 ENCOUNTER — Inpatient Hospital Stay: Admitting: Hematology

## 2024-05-07 ENCOUNTER — Other Ambulatory Visit: Payer: Self-pay | Admitting: *Deleted

## 2024-05-07 NOTE — Transitions of Care (Post Inpatient/ED Visit) (Signed)
 Transition of Care week 4/ day # 23  Visit Note  05/07/2024  Name: Danny [name] MRN: [id number]          DOB: 1933/08/22  Situation: Patient enrolled in [facility] 30-day program. Visit completed with patient by telephone.   HIPAA identifiers x 2 verified  Background:  Recent Hospitalization for treatment of CHF with subsequent surgical TAVR September 16- April 10, 2024 Independent at baseline; using walker post-hospital discharge- does not use at baseline; resides with supportive spouse and adult daughter- assisting with care needs as indicated Vehicle currently in shop: using the [employer] to get around  Denies need for additional resources for transportation Baseline very poor cell reception per patient report: he requests that all subsequent calls be placed to home land line: [phone]: (1) unplanned hospital admission x last (6)/ (12) months  Initial Transition Care Management Follow-up Telephone Call Discharge Date and Diagnosis: 04/10/24, CHF exacerbation with subsequent surgical TAVR   Past Medical History:  Diagnosis Date   (HFpEF) heart failure with preserved ejection fraction (HCC)    Echo 1/23: Inferior AK, mild aortic stenosis (mean 9 mmHg, V-max 210.5 cm/s, DI 0.70), EF 55-60, GR 1 DD, normal RVSF, normal PASP, trivial MR, mild AI, borderline dilation of aortic root (39 mm)   Acid reflux    hiatal hernia   Anemia    Bladder cancer (HCC)    CAD (coronary artery disease)    S/p non-STEMI 1/23 >> s/p CABG   Carotid artery disease 08/23/2021   Pre-CABG Dopplers 1/23:R ICA 40-59; L ICA 1-39 // Carotid US  07/20/2023: RICA 40-59; LICA 1-39   CHF (congestive heart failure) (HCC)    Chronic kidney disease (CKD)    Coronary artery disease involving native coronary artery of native heart without angina pectoris 05/16/2022   Inf NSTEMI s/p CABG in 07/2021 (RIMA-LAD, LIMA-OM1, S-OM2)   Diverticulitis    Erectile dysfunction 04/20/2015   Essential hypertension 04/20/2015    History of colon polyps    History of kidney stones    History of stroke    Noted on brain MRI 07/2021   Hyperlipidemia 05/28/2015   PAD (peripheral artery disease)    Pre-CABG Dopplers 1/23: Right ABI 0.65; left ABI 0.82   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 Wendel and Dr. Aneita   Skin cancer    Stroke [facility])    noted MRI 2023 pt. unaware   Assessment:  I am doing just fine; PT came yesterday and said I am making progress.  No longer needing the walker, and no falls.  Still taking the lasix  every day for now until I go to the heart doctor next week- and she can tell me if I should take it every day or not.  My weights have all been between 117-120 lbs, and my daughter re-calibrated the scales we have here, so I think they are now accurate.  No issues with breathing or with swelling.  I am doing just fine; I will look into getting the flu shot as you tell me to    Denies clinical concerns and sounds to be in no distress throughout [facility] 30-day program outreach call today  Patient Reported Symptoms: Cognitive Cognitive Status: Alert and oriented to person, place, and time, Normal speech and language skills, Insightful and able to interpret abstract concepts Cognitive/Intellectual Conditions Management [RPT]: None reported or documented in medical history or problem list  Neurological Neurological Review of Symptoms: No symptoms reported    HEENT HEENT Symptoms Reported: No symptoms reported      Cardiovascular Cardiovascular Symptoms Reported: No symptoms reported Other Cardiovascular Symptoms: Reports daughter re-calibrated the scales we have here and my weights have been running between 117-120 lbs without weight gain of 3 pounds overnight Does patient have uncontrolled Hypertension?: No Cardiovascular Management Strategies: Medication therapy, Routine screening, Coping strategies,  Adequate rest, Weight management Do You Have a Working Readable Scale?: Yes Weight: 117 lb (53.1 kg) (home reported weight from 05/07/24)  Respiratory Respiratory Symptoms Reported: No symptoms reported Other Respiratory Symptoms: Denies shortness of breath; sounds to be in no respiratory distress throughout TOC call Respiratory Management Strategies: Weight management, Routine screening, Coping strategies, Adequate rest  Endocrine Endocrine Symptoms Reported: No symptoms reported Is patient diabetic?: No    Gastrointestinal Gastrointestinal Symptoms Reported: No symptoms reported Additional Gastrointestinal Details: Reports very good appetite; normal and regular BM's reports last BM as last night      Genitourinary Genitourinary Symptoms Reported: No symptoms reported    Integumentary Integumentary Symptoms Reported: No symptoms reported    Musculoskeletal Musculoskelatal Symptoms Reviewed: No symptoms reported Additional Musculoskeletal Details: confirmed not currently requiring/ using assistive devices for ambulation- has walker for prn use; confirms home health PT came yesterday and said I was doing very good with my progress.  States I will use the walker if I feel unsteady, but I am not needing it much at all so I am not using it if I don't need it Confirmed no new/ recent falls post- last week TOC call Musculoskeletal Management Strategies: Coping strategies, Routine screening, Medical device      Psychosocial Psychosocial Symptoms Reported: No symptoms reported, Difficulty concentrating Behavioral Management Strategies: Coping strategies, Support system Major Change/Loss/Stressor/Fears (CP): Medical condition, self Techniques to Cope with Loss/Stress/Change: Diversional activities Quality of Family Relationships: supportive, involved, helpful   There were no vitals filed for this visit.  Medications Reviewed Today     Reviewed by Arra Connaughton M, RN (Registered Nurse)  on 05/07/24 at 1410  Med List Status: <None>   Medication Order Taking? Sig Documenting Provider Last Dose Status Informant  carvedilol (COREG) 3.125 MG tablet [id number] Yes Take 1 tablet (3.125 mg total) by mouth 2 (two) times daily with a meal. Cindy Garnette POUR, MD  Active   furosemide  (LASIX ) 40 MG tablet [id number] Yes Take 1 tablet (40 mg total) by mouth daily as needed for fluid or edema.  Patient taking differently: Take 40 mg by mouth daily as needed for fluid or edema. 05/07/24: Reports during TOC call- taking it daily --- but orders notes as needed   Cindy Garnette POUR, MD  Active   nitroGLYCERIN  (NITROSTAT ) 0.4 MG SL tablet [id number] Yes Place 1 tablet (0.4 mg total) under the tongue every 5 (five) minutes as needed for chest pain. Lelon Hamilton T, PA-C  Active Self  rosuvastatin  (CRESTOR ) 10 MG tablet [id number] Yes Take 1 tablet (10 mg total) by mouth daily. Cindy Garnette POUR, MD  Active   spironolactone (ALDACTONE) 25 MG tablet [id number] Yes Take 1 tablet (25 mg total) by mouth daily. Cindy Garnette POUR, MD  Active            Recommendation:   PCP Follow-up: still needs post-hospital discharge PCP appointment: continues to decline- states wants to schedule independently Specialty provider follow-up- as scheduled with cardiology provider 05/15/24 Continue Current Plan of Care  Follow Up Plan:   Telephone follow-up in  1 week  Pls call/ message for questions,  Sydna Brodowski Mckinney Velora Horstman, RN, BSN, CCRN Alumnus RN Care Manager  Transitions of Care  [facility] - [facility] [phone]: direct office

## 2024-05-07 NOTE — Patient Instructions (Addendum)
 Visit Information  Thank you for taking time to visit with me today. Please don't hesitate to contact me if I can be of assistance to you before our next scheduled telephone appointment.  Our next appointment is by telephone on Wednesday, May 14, 2024 at 3:15 pm  Please call the care guide team at [phone] if you need to cancel or reschedule your appointment.   Following are the goals we discussed today:  Patient Self Care Activities:  Attend all scheduled provider appointments Call provider office for new concerns or questions  Participate in Transition of Care Program/Attend TOC scheduled calls Take medications as prescribed   call office if I gain more than 2 pounds in one day or 5 pounds in one week track weight in diary use salt in moderation watch for swelling in feet, ankles and legs every day weigh myself daily follow rescue plan if symptoms flare-up Continue working with the home health team that is involved in your care Continue pacing activity as your recuperation from recent surgery continues Use assistive devices as needed to prevent falls- your walker Weigh yourself every day to stay on top of early fluid retention: write down your weights every day so you remember what it is from day to day: follow the weight-gain guidelines and action plan to call your doctor if you gain more than 3 lbs overnight, or 5 lbs in one week Take all recorded weights from home monitoring to upcoming cardiology provider office visit for your doctor to review Please make an effort to get your flu vaccine shot as soon as possible If you believe your condition is getting worse- contact your care providers (doctors) promptly- reaching out to your doctor early when you have concerns can prevent you from having to go to the hospital  If you are experiencing a Mental Health or Behavioral Health Crisis or need someone to talk to, please  call the Suicide and Crisis Lifeline: 988 call the [place]   National Suicide Prevention Lifeline: 406-866-0819 or TTY: (732)879-8263 TTY 469-409-8004) to talk to a trained counselor call 1-800-273-TALK (toll free, 24 hour hotline) go to [facility] [address] [place] [phone]) call the [facility] Crisis Line: [phone] call 911   Care plan and visit instructions communicated with the patient verbally today. Patient agrees to receive a copy in MyChart. Active MyChart status and patient understanding of how to access instructions and care plan via MyChart confirmed with patient.     Pls call/ message for questions,  Desman Polak Mckinney Mohmed Farver, RN, BSN, CCRN Alumnus RN Care Manager  Transitions of Care  [facility] - [facility] [phone]: direct office

## 2024-05-14 ENCOUNTER — Other Ambulatory Visit: Payer: Self-pay | Admitting: *Deleted

## 2024-05-14 ENCOUNTER — Encounter (HOSPITAL_COMMUNITY): Payer: Self-pay | Admitting: Internal Medicine

## 2024-05-14 VITALS — BP 115/64 | Wt 117.0 lb

## 2024-05-14 DIAGNOSIS — Z952 Presence of prosthetic heart valve: Secondary | ICD-10-CM

## 2024-05-14 DIAGNOSIS — I5023 Acute on chronic systolic (congestive) heart failure: Secondary | ICD-10-CM

## 2024-05-14 NOTE — Transitions of Care (Post Inpatient/ED Visit) (Signed)
 Transition of Care week # 5/ day # 30 TOC 30-day program case closure  Visit Note  05/14/2024  Name: Danny [name] MRN: [id number]          DOB: May 25, 1934  Situation: Patient enrolled in [facility] 30-day program. Visit completed with patient by telephone.   HIPAA identifiers x 2 verified  Background:  Recent Hospitalization for treatment of CHF with subsequent surgical TAVR September 16- April 10, 2024 Independent at baseline; using walker post-hospital discharge- does not use at baseline; resides with supportive spouse and adult daughter- assisting with care needs as indicated Vehicle currently in shop: using the Gisele to get around  Denies need for additional resources for transportation Baseline very poor cell reception per patient report: he requests that all subsequent calls be placed to home land line: [phone]: (1) unplanned hospital admission x last (6)/ (12) months Baseline very poor cell reception per patient report: he requests that all subsequent calls be placed to home land line: [phone]:  TOC 30--day outreach completed; patient has successfully met/ accomplished established goals for [facility] 30-day program without hospital readmission and referral was sent for ongoing follow up with longitudinal RN CM   Initial Transition Care Management Follow-up Telephone Call Discharge Date and Diagnosis: 04/10/24, CHF exacerbation with subsequent surgical TAVR   Past Medical History:  Diagnosis Date   (HFpEF) heart failure with preserved ejection fraction (HCC)    Echo 1/23: Inferior AK, mild aortic stenosis (mean 9 mmHg, V-max 210.5 cm/s, DI 0.70), EF 55-60, GR 1 DD, normal RVSF, normal PASP, trivial MR, mild AI, borderline dilation of aortic root (39 mm)   Acid reflux    hiatal hernia   Anemia    Bladder cancer (HCC)    CAD (coronary artery disease)    S/p non-STEMI 1/23 >> s/p CABG   Carotid artery disease 08/23/2021   Pre-CABG Dopplers 1/23:R ICA 40-59; L ICA 1-39 //  Carotid US  07/20/2023: RICA 40-59; LICA 1-39   CHF (congestive heart failure) (HCC)    Chronic kidney disease (CKD)    Coronary artery disease involving native coronary artery of native heart without angina pectoris 05/16/2022   Inf NSTEMI s/p CABG in 07/2021 (RIMA-LAD, LIMA-OM1, S-OM2)   Diverticulitis    Erectile dysfunction 04/20/2015   Essential hypertension 04/20/2015   History of colon polyps    History of kidney stones    History of stroke    Noted on brain MRI 07/2021   Hyperlipidemia 05/28/2015   PAD (peripheral artery disease)    Pre-CABG Dopplers 1/23: Right ABI 0.65; left ABI 0.82   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 Wendel and Dr. Aneita   Skin cancer    Stroke [facility])    noted MRI 2023 pt. unaware   Assessment:  I am still doing just fine; PT came today and just left and they have ordered me a new scale.  My weights here at home are still running consistently 117-120, and I am breathing fine.  No falls and not having to use the walker or cane, although I keep them nearby just in case.  Still taking the Lasix  fluid pill every day for now- I will ask the heart doctor how I should be taking it when I see her tomorrow.  Still using Gisele and that is going fine.  I will get the flu shot soon, one way or another    Denies  clinical concerns and sounds to be in no distress throughout [facility] 30-day program outreach call today  Patient Reported Symptoms: Cognitive Cognitive Status: Normal speech and language skills, Alert and oriented to person, place, and time, Insightful and able to interpret abstract concepts Cognitive/Intellectual Conditions Management [RPT]: None reported or documented in medical history or problem list      Neurological Neurological Review of Symptoms: No symptoms reported    HEENT HEENT Symptoms Reported: No symptoms reported      Cardiovascular Cardiovascular  Symptoms Reported: No symptoms reported, Other: Other Cardiovascular Symptoms: Confirmed continues monitoring- recording daily weights at home; reports ongoing consistent daily weights between 117-120 lbs; continues taking lasix  QD (ordered prn): reminded to obtain clarification from cardiology provider during tomorrow's office visit re: whether he should be taking as needed or QD: patient verbalizes understanding of same, states will do- care coordination outreach to cardiology provider as FYI Does patient have uncontrolled Hypertension?: No Cardiovascular Management Strategies: Routine screening, Coping strategies, Medication therapy, Adequate rest, Weight management Do You Have a Working Readable Scale?: Yes Weight: 117 lb (53.1 kg) (home reported value from 05/14/24)  Respiratory Respiratory Symptoms Reported: No symptoms reported Other Respiratory Symptoms: Denies shortness of breath and sounds to be in no respiratory distress throughout [facility] call; again reminded patient to obtain flu vaccine for 2025-2026 winter season promptly- he confirms still has not yet completed Respiratory Management Strategies: Routine screening, Adequate rest, Coping strategies, Weight management  Endocrine Endocrine Symptoms Reported: No symptoms reported Is patient diabetic?: No    Gastrointestinal Gastrointestinal Symptoms Reported: No symptoms reported Additional Gastrointestinal Details: Continues to report good appetite and regular BM's Gastrointestinal Management Strategies: Coping strategies    Genitourinary Genitourinary Symptoms Reported: No symptoms reported    Integumentary Integumentary Symptoms Reported: No symptoms reported    Musculoskeletal Musculoskelatal Symptoms Reviewed: No symptoms reported Additional Musculoskeletal Details: confirmed not currently requiring/ using assistive devices for ambulation on a regular basis- has cane/ walker but does not use consistently; confirmed no new/ recent  falls since last TOC call; confirmed home health PT remains active Musculoskeletal Management Strategies: Coping strategies, Routine screening, Adequate rest, Activity, Exercise      Psychosocial Psychosocial Symptoms Reported: No symptoms reported, Difficulty concentrating Additional Psychological Details: Continues to require minimal- moderate re-focusing throughout TOC call- as per baseline at time of initial TOC call Behavioral Management Strategies: Support system, Coping strategies Major Change/Loss/Stressor/Fears (CP): Medical condition, self Techniques to Cope with Loss/Stress/Change: Diversional activities Quality of Family Relationships: supportive, involved, helpful   Vitals:   05/14/24 1515  BP: 115/64    Medications Reviewed Today     Reviewed by Riane Rung M, RN (Registered Nurse) on 05/14/24 at 1528  Med List Status: <None>   Medication Order Taking? Sig Documenting Provider Last Dose Status Informant  carvedilol (COREG) 3.125 MG tablet [id number]  Take 1 tablet (3.125 mg total) by mouth 2 (two) times daily with a meal. Cindy Garnette POUR, MD  Expired 05/10/24 2359   furosemide  (LASIX ) 40 MG tablet [id number]  Take 1 tablet (40 mg total) by mouth daily as needed for fluid or edema.  Patient taking differently: Take 40 mg by mouth daily as needed for fluid or edema. 05/07/24: Reports during TOC call- taking it daily --- but orders notes as needed   Cindy Garnette POUR, MD  Active   nitroGLYCERIN  (NITROSTAT ) 0.4 MG SL tablet [id number]  Place 1 tablet (0.4 mg total) under the tongue every 5 (five) minutes as needed for chest pain.  Lelon Hamilton T, PA-C  Active Self  rosuvastatin  (CRESTOR ) 10 MG tablet [id number]  Take 1 tablet (10 mg total) by mouth daily. Cindy Garnette POUR, MD  Active   spironolactone (ALDACTONE) 25 MG tablet [id number]  Take 1 tablet (25 mg total) by mouth daily. Cindy Garnette POUR, MD  Active            Recommendation:   Specialty provider follow-up: cardiology  provider as scheduled 05/15/24 Continue Current Plan of Care  Follow Up Plan:   Referral to RN Case Manager Closing From:  Transitions of Care Program  Pls call/ message for questions,  Jailynne Opperman Mckinney Daune Divirgilio, RN, BSN, CCRN Alumnus RN Care Manager  Transitions of Care  [facility] - [facility] [phone]: direct office

## 2024-05-14 NOTE — Patient Instructions (Signed)
 Visit Information  Thank you for taking time to visit with me today. Please don't hesitate to contact me if I can be of assistance to you   It has been a pleasure working with you over the last 30 days!  Please listen for a call from the scheduling care guide to schedule a phone call with the new nurse care manager who will pick up in your care where we are leaving off today  Following are the goals we discussed today:  Patient Self Care Activities:  Attend all scheduled provider appointments Call provider office for new concerns or questions  Take medications as prescribed   call office if I gain more than 2 pounds in one day or 5 pounds in one week track weight in diary use salt in moderation watch for swelling in feet, ankles and legs every day weigh myself daily follow rescue plan if symptoms flare-up Continue working with the home health team that is involved in your care Continue pacing activity as your recuperation from recent surgery continues Use assistive devices as needed to prevent falls- your walker Weigh yourself every day to stay on top of early fluid retention: write down your weights every day so you remember what it is from day to day: follow the weight-gain guidelines and action plan to call your doctor if you gain more than 3 lbs overnight, or 5 lbs in one week Take all recorded weights from home monitoring to upcoming cardiology provider office visit for your doctor to review Please make an effort to get your flu vaccine shot as soon as possible If you believe your condition is getting worse- contact your care providers (doctors) promptly- reaching out to your doctor early when you have concerns can prevent you from having to go to the hospital  If you are experiencing a Mental Health or Behavioral Health Crisis or need someone to talk to, please  call the Suicide and Crisis Lifeline: 988 call the [place]  National Suicide Prevention Lifeline: (562) 535-0106 or TTY:  (438)548-9874 TTY 216 510 8991) to talk to a trained counselor call 1-800-273-TALK (toll free, 24 hour hotline) go to [facility] [address], [place] [phone]) call the [facility] Crisis Line: (239) 447-5771 call 911   Care plan and visit instructions communicated with the patient verbally today. Patient agrees to receive a copy in MyChart. Active MyChart status and patient understanding of how to access instructions and care plan via MyChart confirmed with patient.     Pls call/ message for questions,  Arleene Settle Mckinney Myliah Medel, RN, BSN, CCRN Alumnus RN Care Manager  Transitions of Care  [facility] - [facility] [phone]: direct office

## 2024-05-14 NOTE — Progress Notes (Unsigned)
 HEART AND VASCULAR CENTER   MULTIDISCIPLINARY HEART VALVE CLINIC                                     Cardiology Office Note:    Date:  05/15/2024   ID:  Danny [name], DOB Sep 16, 1933, MRN [id number]  PCP:  [name] LABOR, MD  [facility] Cardiologist:  None  [facility] Structural heart: Lurena MARLA Red, MD [facility] Electrophysiologist:  None   Referring MD: [name] LABOR, *   1 month s/p TAVR  History of Present Illness:    Danny [name] is a 89 y.o. 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   EF previously 40-45% on LV gram at time of cath/NSTEMI in 07/2021. S/p CABG (RIMA to LAD, LIMA to OM1, SVG to OM2) by Dr. Lucas. Aortic stenosis noted to be mild at that time. EF subsequently normalized.    Last echo 12/2023 showed EF 50-55%, RWMA w/ basal inferior and basal inferoseptal severe HK, RV okay, moderate AS-severe severely calcified valve with mean gradient of 23 mmHg and AVA 1.1 cm2 by VTI.   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 TTE 03/26/24 w/ LVEF <20%, G2DD, moderate RV dysfunction, severe MR, mod-severe TR, moderate PR, moderate pleural effusion in lateral region, and severe LFLG aortic stenosis with mean gradient 17 mm hg, Vmax 2.85 m/s, AVA 0.77cm2, and moderate AI. He was eventually weaned off milrinone  and diuresed to euvolemia. Large bilateral pleural effusions were noted on CTs and he is now s/p -1.3L on the L and -1.4L on the R thoracentesis 04/01/24 with marked symptomatic improvement. S/p TAVR with a 23 mm Edwards S3UR via the TF approach by Dr. Red and Dr. Aneita on 04/04/24. Echo POD1 showed EF up to 35-40% and bioprosthetic  aortic valve functioning normally. Post op course c/b severe thrombocytopenia with Plt down to 30. Hematology consulted and felt to be related to severe aortic stenosis and recent TAVR. He has a bone marrow biopsy set up for 04/30/24 but he no showed or cancelled all of his apts.   Today the patient presents to clinic for follow up. Here alone. He is unsure of what medications he is taking. Took an uber here. Doesn't remember having TAVR. Daughter would not pick up phone. No CP or SOB. Mild LE edema, but no orthopnea or PND. No dizziness or syncope. Has had some blood in his urine. No palpitations. He has been busy doing laundry and cooks. He relies on Uber to get around.     Past Medical History:  Diagnosis Date   (HFpEF) heart failure with preserved ejection fraction (HCC)    Echo 1/23: Inferior AK, mild aortic stenosis (mean 9 mmHg, V-max 210.5 cm/s, DI 0.70), EF 55-60, GR 1 DD, normal RVSF, normal PASP, trivial MR, mild AI, borderline dilation of aortic root (39 mm)   Acid reflux    hiatal hernia   Anemia    Bladder cancer (HCC)    CAD (coronary artery disease)    S/p non-STEMI 1/23 >> s/p CABG   Carotid artery disease 08/23/2021  Pre-CABG Dopplers 1/23:R ICA 40-59; L ICA 1-39 // Carotid US  07/20/2023: RICA 40-59; LICA 1-39   CHF (congestive heart failure) (HCC)    Chronic kidney disease (CKD)    Coronary artery disease involving native coronary artery of native heart without angina pectoris 05/16/2022   Inf NSTEMI s/p CABG in 07/2021 (RIMA-LAD, LIMA-OM1, S-OM2)   Diverticulitis    Erectile dysfunction 04/20/2015   Essential hypertension 04/20/2015   History of colon polyps    History of kidney stones    History of stroke    Noted on brain MRI 07/2021   Hyperlipidemia 05/28/2015   PAD (peripheral artery disease)    Pre-CABG Dopplers 1/23: Right ABI 0.65; left ABI 0.82   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 Wendel and Dr. Aneita   Skin cancer    Stroke [facility])    noted MRI 2023 pt. unaware     Current Medications: Current Meds  Medication Sig   [DISCONTINUED] cephALEXin  (KEFLEX ) 750 MG capsule Take 1 capsule (750 mg total) by mouth 4 (four) times daily.      ROS:   Please see the history of present illness.    All other systems reviewed and are negative.  EKGs       Risk Assessment/Calculations:           Physical Exam:    VS:  BP 118/64   Pulse (!) 58   Ht 5' 4 (1.626 m)   Wt 119 lb 9.6 oz (54.3 kg)   SpO2 100%   BMI 20.53 kg/m     Wt Readings from Last 3 Encounters:  05/15/24 119 lb 9.6 oz (54.3 kg)  05/14/24 117 lb (53.1 kg)  05/07/24 117 lb (53.1 kg)     GEN: Well nourished, well developed in no acute distress, thin chronically ill appearing.  NECK: No JVD, mildly infected wound on neck? From central line.    CARDIAC: RRR, no murmurs, rubs, gallops RESPIRATORY:  Clear to auscultation without rales, wheezing or rhonchi  ABDOMEN: Soft, non-tender, non-distended EXTREMITIES:  No edema; No deformity.     ASSESSMENT:    1. S/P TAVR (transcatheter aortic valve replacement)   2. HFrEF (heart failure with reduced ejection fraction) (HCC)   3. Coronary artery disease involving coronary bypass graft of native heart without angina pectoris   4. Macrocytic anemia   5. Thrombocytopenia   6. Stage 3a chronic kidney disease (HCC)   7. Neck infection     PLAN:    In order of problems listed above:  Severe AS s/p TAVR:  -- Unfortunately, the pt showed up almost 40 minutes late to his appointment today and echo will be r/s.  -- NYHA class I symptoms.  -- Not on aspirin  due to severe anemia and thrombocytopenia.  -- SBE prophylaxis discussed; the patient is edentulous and does not go to the dentist.   Chronic HFrEF: -- Recent admit with cardiogenic shock, pleural effusions requiring thoracentesis -- Echo 04/05/24 POD1 TAVR EF up to 35-40%  and bioprosthetic aortic valve functioning normally. -- Missed echo today.  -- Discharged on Lasix  40mg  PRN at discharge, but reportedly ? taking daily.  -- No SGLT2i given recent [place] UTI in hospital and gas in bladder.  -- Continue spironolactone 25 mg daily  -- Continue coreg 3.125 mg BID -- Hesitant to use ARB/ARNi with advanced age, dementia and confusion surrounding medications.  CAD: -- NSTEMI 2023 s/p CABG X 3. -- Per notes cardiac CT demonstrated patent RIMA-LAD and LIMA-OM1 as well as an occluded SVG-->OM which was not felt to explain his cardiomyopathy, angiography deferred. -- Continue rosuvastatin  10mg  daily. -- No aspirin  with significant anemia and thrombocytopenia.  -- Check CBC today.    Chronic macrocytic anemia: -- No overt GI bleeding noted during admission.   -- Seen by Heme/Onc, anemia felt to be multifactorial. -- CBC today  Thrombocytopenia -- Platelets down to 30 during admission.  -- Heme/Onc felt thromboctyopenia likely 2/2 severe AS and recent TAVR.  -- Pt no showed to bone marrow biopsy on 04/30/24.  CKD IIIa: -- Baseline Scr 1.2-1.3, peaked 2.7 in setting of shock during admission.  -- CMET today   Neck infection: -- He has a mildly purulent sore on his neck where I think he had a central line.  -- I removed clear bandage and will Rx Keflex  500 mg BID out of an abundance of caution given recent valve replacement and inability to reliably get to PCP or derm office.   ADDENDUM: finally able to get in touch with daughter and she said the spot on his neck is chronic and has been seen by derm in the past. I think it is likely not infected so does not need to take the Keflex .     Dispo: very difficult visit with pts underlying dementia and no support. I called [name] several times with no answer- did send a text back that she was busy. Was able to reach wife but she doesn't know what the pt is taking. I am going to reach out to social work to see if  there is anything we can do to help him with medications/rides (currently taking Ubers). May require paramedicine. I will not r/s echo as he will not likely come without some sort of home health intervention. I have asked him to go to to the lab for a CBC and CMET as he as not made any of his follow up appts since TAVR and doesn't even remember having a TAVR.   Total time spent with patient was over 40 minutes which included evaluating patient, reviewing record and coordinating care. Face to face time >50%.     Medication Adjustments/Labs and Tests Ordered: Current medicines are reviewed at length with the patient today.  Concerns regarding medicines are outlined above.  Orders Placed This Encounter  Procedures   Comp Met (CMET)   CBC   Meds ordered this encounter  Medications   DISCONTD: cephALEXin  (KEFLEX ) 750 MG capsule    Sig: Take 1 capsule (750 mg total) by mouth 4 (four) times daily.    Dispense:  40 capsule    Refill:  0    Supervising Provider:   COOPER, MICHAEL [[id number]]   cephALEXin  (KEFLEX ) 500 MG capsule    Sig: Take 1 capsule (500 mg total) by mouth 2 (two) times daily.    Dispense:  14 capsule    Refill:  0    Supervising Provider:   WONDA SHARPER [[id number]]    Patient Instructions  Medication Instructions:  Your physician has recommended you make the following change in your medication:  START Keflex  to take 4 times daily for 10 days. Patient sent to the pharmacy in the office to pick up prescription. *If you need a refill on your cardiac medications before your next appointment, please call your pharmacy*  Lab Work: TODAY BMET and CBC If you have labs (blood  work) drawn today and your tests are completely normal, you will receive your results only by: MyChart Message (if you have MyChart) OR A paper copy in the mail If you have any lab test that is abnormal or we need to change your treatment, we will call you to review the results.  Testing/Procedures: We will  call to reschedule Your physician has requested that you have an echocardiogram. Echocardiography is a painless test that uses sound waves to create images of your heart. It provides your doctor with information about the size and shape of your heart and how well your heart's chambers and valves are working. This procedure takes approximately one hour. There are no restrictions for this procedure. Please do NOT wear cologne, perfume, aftershave, or lotions (deodorant is allowed). Please arrive 15 minutes prior to your appointment time.  Please note: We ask at that you not bring children with you during ultrasound (echo/ vascular) testing. Due to room size and safety concerns, children are not allowed in the ultrasound rooms during exams. Our front office staff cannot provide observation of children in our lobby area while testing is being conducted. An adult accompanying a patient to their appointment will only be allowed in the ultrasound room at the discretion of the ultrasound technician under special circumstances. We apologize for any inconvenience.   Follow-Up: At [facility] you and your health needs are our priority.  As part of our continuing mission to provide you with exceptional heart care, our providers are all part of one team.  This team includes your primary Cardiologist (physician) and Advanced Practice Providers or APPs (Physician Assistants and Nurse Practitioners) who all work together to provide you with the care you need, when you need it.  Your next appointment:   We will call to reschedule your echocardiogram.   We recommend signing up for the patient portal called MyChart.  Sign up information is provided on this After Visit Summary.  MyChart is used to connect with patients for Virtual Visits (Telemedicine).  Patients are able to view lab/test results, encounter notes, upcoming appointments, etc.  Non-urgent messages can be sent to your provider as well.   To  learn more about what you can do with MyChart, go to [email or web address].          Signed, Lamarr Hummer, PA-C  05/15/2024 4:11 PM    [facility]

## 2024-05-15 ENCOUNTER — Other Ambulatory Visit (HOSPITAL_COMMUNITY): Payer: Self-pay

## 2024-05-15 ENCOUNTER — Ambulatory Visit (HOSPITAL_COMMUNITY): Attending: Cardiovascular Disease

## 2024-05-15 ENCOUNTER — Ambulatory Visit (INDEPENDENT_AMBULATORY_CARE_PROVIDER_SITE_OTHER): Admitting: Physician Assistant

## 2024-05-15 VITALS — BP 118/64 | HR 58 | Ht 64.0 in | Wt 119.6 lb

## 2024-05-15 DIAGNOSIS — I130 Hypertensive heart and chronic kidney disease with heart failure and stage 1 through stage 4 chronic kidney disease, or unspecified chronic kidney disease: Secondary | ICD-10-CM | POA: Diagnosis not present

## 2024-05-15 DIAGNOSIS — I5020 Unspecified systolic (congestive) heart failure: Secondary | ICD-10-CM | POA: Diagnosis not present

## 2024-05-15 DIAGNOSIS — D631 Anemia in chronic kidney disease: Secondary | ICD-10-CM | POA: Diagnosis not present

## 2024-05-15 DIAGNOSIS — L089 Local infection of the skin and subcutaneous tissue, unspecified: Secondary | ICD-10-CM

## 2024-05-15 DIAGNOSIS — D539 Nutritional anemia, unspecified: Secondary | ICD-10-CM

## 2024-05-15 DIAGNOSIS — D696 Thrombocytopenia, unspecified: Secondary | ICD-10-CM

## 2024-05-15 DIAGNOSIS — C679 Malignant neoplasm of bladder, unspecified: Secondary | ICD-10-CM | POA: Diagnosis not present

## 2024-05-15 DIAGNOSIS — N1831 Chronic kidney disease, stage 3a: Secondary | ICD-10-CM

## 2024-05-15 DIAGNOSIS — I739 Peripheral vascular disease, unspecified: Secondary | ICD-10-CM | POA: Diagnosis not present

## 2024-05-15 DIAGNOSIS — Z952 Presence of prosthetic heart valve: Secondary | ICD-10-CM | POA: Diagnosis not present

## 2024-05-15 DIAGNOSIS — I5043 Acute on chronic combined systolic (congestive) and diastolic (congestive) heart failure: Secondary | ICD-10-CM | POA: Diagnosis not present

## 2024-05-15 DIAGNOSIS — I25810 Atherosclerosis of coronary artery bypass graft(s) without angina pectoris: Secondary | ICD-10-CM | POA: Diagnosis not present

## 2024-05-15 DIAGNOSIS — N179 Acute kidney failure, unspecified: Secondary | ICD-10-CM | POA: Diagnosis not present

## 2024-05-15 DIAGNOSIS — N1832 Chronic kidney disease, stage 3b: Secondary | ICD-10-CM | POA: Diagnosis not present

## 2024-05-15 DIAGNOSIS — D630 Anemia in neoplastic disease: Secondary | ICD-10-CM | POA: Diagnosis not present

## 2024-05-15 MED ORDER — CEPHALEXIN 750 MG PO CAPS: 750.0000 mg | ORAL_CAPSULE | Freq: Four times a day (QID) | ORAL | 0 refills | Status: DC

## 2024-05-15 MED ORDER — CEPHALEXIN 500 MG PO CAPS
500.0000 mg | ORAL_CAPSULE | Freq: Two times a day (BID) | ORAL | 0 refills | Status: DC
  Filled 2024-05-15: qty 14, 7d supply, fill #0

## 2024-05-15 NOTE — Addendum Note (Signed)
 Addended by: SEBASTIAN LAMARR SAUNDERS on: 05/15/2024 04:11 PM   Modules accepted: Orders

## 2024-05-15 NOTE — Patient Instructions (Signed)
 Medication Instructions:  Your physician has recommended you make the following change in your medication:  START Keflex  to take 4 times daily for 10 days. Patient sent to the pharmacy in the office to pick up prescription. *If you need a refill on your cardiac medications before your next appointment, please call your pharmacy*  Lab Work: TODAY BMET and CBC If you have labs (blood work) drawn today and your tests are completely normal, you will receive your results only by: MyChart Message (if you have MyChart) OR A paper copy in the mail If you have any lab test that is abnormal or we need to change your treatment, we will call you to review the results.  Testing/Procedures: We will call to reschedule Your physician has requested that you have an echocardiogram. Echocardiography is a painless test that uses sound waves to create images of your heart. It provides your doctor with information about the size and shape of your heart and how well your heart's chambers and valves are working. This procedure takes approximately one hour. There are no restrictions for this procedure. Please do NOT wear cologne, perfume, aftershave, or lotions (deodorant is allowed). Please arrive 15 minutes prior to your appointment time.  Please note: We ask at that you not bring children with you during ultrasound (echo/ vascular) testing. Due to room size and safety concerns, children are not allowed in the ultrasound rooms during exams. Our front office staff cannot provide observation of children in our lobby area while testing is being conducted. An adult accompanying a patient to their appointment will only be allowed in the ultrasound room at the discretion of the ultrasound technician under special circumstances. We apologize for any inconvenience.   Follow-Up: At [facility], you and your health needs are our priority.  As part of our continuing mission to provide you with exceptional heart care,  our providers are all part of one team.  This team includes your primary Cardiologist (physician) and Advanced Practice Providers or APPs (Physician Assistants and Nurse Practitioners) who all work together to provide you with the care you need, when you need it.  Your next appointment:   We will call to reschedule your echocardiogram.   We recommend signing up for the patient portal called MyChart.  Sign up information is provided on this After Visit Summary.  MyChart is used to connect with patients for Virtual Visits (Telemedicine).  Patients are able to view lab/test results, encounter notes, upcoming appointments, etc.  Non-urgent messages can be sent to your provider as well.   To learn more about what you can do with MyChart, go to [email or web address].

## 2024-05-16 ENCOUNTER — Ambulatory Visit: Payer: Self-pay | Admitting: Physician Assistant

## 2024-05-16 ENCOUNTER — Telehealth: Payer: Self-pay

## 2024-05-16 ENCOUNTER — Telehealth (HOSPITAL_COMMUNITY): Payer: Self-pay | Admitting: Licensed Clinical Social Worker

## 2024-05-16 DIAGNOSIS — I5020 Unspecified systolic (congestive) heart failure: Secondary | ICD-10-CM

## 2024-05-16 LAB — CBC
Hematocrit: 22 % — ABNORMAL LOW (ref 37.5–51.0)
Hemoglobin: 7.5 g/dL — ABNORMAL LOW (ref 13.0–17.7)
MCH: 35 pg — ABNORMAL HIGH (ref 26.6–33.0)
MCHC: 34.1 g/dL (ref 31.5–35.7)
MCV: 103 fL — ABNORMAL HIGH (ref 79–97)
Platelets: 70 x10E3/uL — CL (ref 150–450)
RBC: 2.14 x10E6/uL — CL (ref 4.14–5.80)
RDW: 19.5 % — ABNORMAL HIGH (ref 11.6–15.4)
WBC: 4.5 x10E3/uL (ref 3.4–10.8)

## 2024-05-16 LAB — COMPREHENSIVE METABOLIC PANEL WITH GFR
ALT: 12 IU/L (ref 0–44)
AST: 17 IU/L (ref 0–40)
Albumin: 4.5 g/dL (ref 3.7–4.7)
Alkaline Phosphatase: 62 IU/L (ref 48–129)
BUN/Creatinine Ratio: 36 — ABNORMAL HIGH (ref 10–24)
BUN: 57 mg/dL — ABNORMAL HIGH (ref 8–27)
Bilirubin Total: 0.4 mg/dL (ref 0.0–1.2)
CO2: 23 mmol/L (ref 20–29)
Calcium: 9.3 mg/dL (ref 8.6–10.2)
Chloride: 98 mmol/L (ref 96–106)
Creatinine, Ser: 1.57 mg/dL — ABNORMAL HIGH (ref 0.76–1.27)
Globulin, Total: 2.8 g/dL (ref 1.5–4.5)
Glucose: 95 mg/dL (ref 70–99)
Potassium: 5.9 mmol/L (ref 3.5–5.2)
Sodium: 135 mmol/L (ref 134–144)
Total Protein: 7.3 g/dL (ref 6.0–8.5)
eGFR: 42 mL/min/1.73 — ABNORMAL LOW (ref >59)

## 2024-05-16 NOTE — Telephone Encounter (Signed)
 Attempted to call pt, unable to reach and unable to LVM. Attempted to call pt's daughter as well, unable to reach and phone continued to ring over and over and did not allow me to try and LVM.

## 2024-05-16 NOTE — Telephone Encounter (Signed)
 Spoke with pt over the phone, unaware Rockie Hummer, PA-C had already spoke to pt earlier regarding elevated K+ level. Explained the concern for having a potassium level that is elevated and pt seemed confused when speaking to him repeatedly stating he knew most foods had potassium in them and he was watching what he ate. Emphasized the recommendations of going to ER to be evaluated with such an elevated potassium but patient didn't seem to want to go. Advised pt that if he did not go to ED tonight, if over the weekend he started to have CP or feel like his heart rate was irregular, he needed to go to ED to be seen. Pt verbalized understanding of plan.

## 2024-05-16 NOTE — Telephone Encounter (Signed)
 H&V Care Navigation CSW Progress Note  Clinical Social Worker consulted due to concerns with pt managing medications at home and transportation barriers to attending appointments.  Patient was supposed to be seen in clinic in Oct after hospital stay but missed- he states due to transportation.  CSW had staff re-schedule visit and informed pt of details.  Provider agreeable to paramedicine referral being placed due to medication confusion and patient is agreeable.  CSW will continue to follow and assist as needed  Andriette HILARIO Leech, LCSW Clinical Social Worker [facility] Desk#: [phone] Cell#: [phone]

## 2024-05-19 ENCOUNTER — Telehealth: Payer: Self-pay | Admitting: *Deleted

## 2024-05-19 ENCOUNTER — Telehealth (HOSPITAL_COMMUNITY): Payer: Self-pay

## 2024-05-19 NOTE — Progress Notes (Signed)
 Complex Care Management Note  Care Guide Note 05/19/2024 Name: [name] MRN: [id number] DOB: Mar 15, 1934  [name] is a 89 y.o. year old male who sees [name] LABOR, MD for primary care. I reached out to [employer] by phone today to offer complex care management services.  [name] was given information about Complex Care Management services today including:   The Complex Care Management services include support from the care team which includes your Nurse Care Manager, Clinical Social Worker, or Pharmacist.  The Complex Care Management team is here to help remove barriers to the health concerns and goals most important to you. Complex Care Management services are voluntary, and the patient may decline or stop services at any time by request to their care team member.   Complex Care Management Consent Status: Patient agreed to services and verbal consent obtained.   Follow up plan:  Telephone appointment with complex care management team member scheduled for:  06/03/2024  Encounter Outcome:  Patient Scheduled  Thedford Franks, CMA [place]  [facility], [facility] Guide Direct Dial: [phone]  Fax: [phone] Website: [email or web address]

## 2024-05-19 NOTE — Telephone Encounter (Signed)
 Attempted to reach Mr. John to establish paramedicine. No answer- left message. Will continue to reach out.    Powell Mirza, EMT-Paramedic [phone] 05/19/2024

## 2024-05-21 DIAGNOSIS — I739 Peripheral vascular disease, unspecified: Secondary | ICD-10-CM | POA: Diagnosis not present

## 2024-05-21 DIAGNOSIS — C679 Malignant neoplasm of bladder, unspecified: Secondary | ICD-10-CM | POA: Diagnosis not present

## 2024-05-21 DIAGNOSIS — I5043 Acute on chronic combined systolic (congestive) and diastolic (congestive) heart failure: Secondary | ICD-10-CM | POA: Diagnosis not present

## 2024-05-21 DIAGNOSIS — D631 Anemia in chronic kidney disease: Secondary | ICD-10-CM | POA: Diagnosis not present

## 2024-05-21 DIAGNOSIS — N1832 Chronic kidney disease, stage 3b: Secondary | ICD-10-CM | POA: Diagnosis not present

## 2024-05-21 DIAGNOSIS — N179 Acute kidney failure, unspecified: Secondary | ICD-10-CM | POA: Diagnosis not present

## 2024-05-21 DIAGNOSIS — D630 Anemia in neoplastic disease: Secondary | ICD-10-CM | POA: Diagnosis not present

## 2024-05-21 DIAGNOSIS — D696 Thrombocytopenia, unspecified: Secondary | ICD-10-CM | POA: Diagnosis not present

## 2024-05-21 DIAGNOSIS — I130 Hypertensive heart and chronic kidney disease with heart failure and stage 1 through stage 4 chronic kidney disease, or unspecified chronic kidney disease: Secondary | ICD-10-CM | POA: Diagnosis not present

## 2024-05-28 ENCOUNTER — Telehealth (HOSPITAL_COMMUNITY): Payer: Self-pay

## 2024-05-28 DIAGNOSIS — I5043 Acute on chronic combined systolic (congestive) and diastolic (congestive) heart failure: Secondary | ICD-10-CM | POA: Diagnosis not present

## 2024-05-28 DIAGNOSIS — N1832 Chronic kidney disease, stage 3b: Secondary | ICD-10-CM | POA: Diagnosis not present

## 2024-05-28 DIAGNOSIS — D631 Anemia in chronic kidney disease: Secondary | ICD-10-CM | POA: Diagnosis not present

## 2024-05-28 DIAGNOSIS — I130 Hypertensive heart and chronic kidney disease with heart failure and stage 1 through stage 4 chronic kidney disease, or unspecified chronic kidney disease: Secondary | ICD-10-CM | POA: Diagnosis not present

## 2024-05-28 DIAGNOSIS — I739 Peripheral vascular disease, unspecified: Secondary | ICD-10-CM | POA: Diagnosis not present

## 2024-05-28 DIAGNOSIS — C679 Malignant neoplasm of bladder, unspecified: Secondary | ICD-10-CM | POA: Diagnosis not present

## 2024-05-28 DIAGNOSIS — D630 Anemia in neoplastic disease: Secondary | ICD-10-CM | POA: Diagnosis not present

## 2024-05-28 DIAGNOSIS — D696 Thrombocytopenia, unspecified: Secondary | ICD-10-CM | POA: Diagnosis not present

## 2024-05-28 DIAGNOSIS — N179 Acute kidney failure, unspecified: Secondary | ICD-10-CM | POA: Diagnosis not present

## 2024-05-28 NOTE — Telephone Encounter (Signed)
 Spoke to Drexel and confirmed home visit for tomorrow at 2:00. I tried to notify his daughter but no answer.   Danny [name], EMT-Paramedic [phone] 05/28/2024

## 2024-05-29 ENCOUNTER — Ambulatory Visit (HOSPITAL_COMMUNITY)

## 2024-05-29 ENCOUNTER — Telehealth (HOSPITAL_COMMUNITY): Payer: Self-pay | Admitting: *Deleted

## 2024-05-29 ENCOUNTER — Other Ambulatory Visit: Payer: Self-pay

## 2024-05-29 ENCOUNTER — Other Ambulatory Visit (HOSPITAL_COMMUNITY)

## 2024-05-29 ENCOUNTER — Other Ambulatory Visit (HOSPITAL_COMMUNITY): Payer: Self-pay

## 2024-05-29 MED ORDER — ROSUVASTATIN CALCIUM 10 MG PO TABS
10.0000 mg | ORAL_TABLET | Freq: Every day | ORAL | 2 refills | Status: AC
  Filled 2024-05-29 – 2024-05-30 (×2): qty 30, 30d supply, fill #0

## 2024-05-29 MED ORDER — CARVEDILOL 3.125 MG PO TABS
3.1250 mg | ORAL_TABLET | Freq: Two times a day (BID) | ORAL | 2 refills | Status: AC
  Filled 2024-05-29 – 2024-05-30 (×2): qty 60, 30d supply, fill #0
  Filled 2024-07-07 – 2024-07-08 (×3): qty 60, 30d supply, fill #1
  Filled 2024-08-08: qty 60, 30d supply, fill #2

## 2024-05-29 MED ORDER — FUROSEMIDE 40 MG PO TABS
40.0000 mg | ORAL_TABLET | Freq: Every day | ORAL | 2 refills | Status: DC
  Filled 2024-05-29 – 2024-05-30 (×2): qty 30, 30d supply, fill #0
  Filled 2024-06-19: qty 30, 30d supply, fill #1

## 2024-05-29 NOTE — Progress Notes (Signed)
 Paramedicine Encounter  SOCIAL/MEDICAL BARRIERS:  PHARMACY USED - [facility]    MED ISSUES:  AFFORDABILITY- none   PT ASSIST APPS NEEDED- none   PCP- [place]   REVIEWED DIET/FLUID/SALT RESTRICTIONS- yes   RENT/OWN HOME ISSUES- own   SOCIAL SUPPORT- daughter [name] lives in the home with him and his wife   SAFETY/DOMESTIC ISSUES- none   SUBSTANCE ABUSE- personally none   DAILY WEIGHTS- yes   EDUCATE ON DISEASE PROCESS/SYMPTOMS/PURPOSES OF MEDS- yes     Patient ID: Danny [name], male    DOB: 24-Nov-1933, 89 y.o.   MRN: [id number]   Complaints- lower leg swelling and weight gain   Assessment- Caox4, warm and dry ambulatory without shortness of breath inside the home, no dizziness, no chest pain, has lower leg swelling +3 BLE up to knees, 13 lbs weight gain since last visit at cardiology office, lungs clear, vitals as noted.   Compliance with meds- non compliant has not taken any meds in several weeks due to no refills and no show to last HF clinic apt .  Pill box filled- n/a   Refills needed- all meds   Meds changes since last visit- stop spiro today and start lasix  40mg  daily per B. Simmons in [facility].    Social changes- none    VISIT SUMMARY- Arrived for home visit for Malakie- this is our first paramedicine visit. He reports to be feeling well with no complaints today other than weight gain and lower leg swelling. He says this has been going on for a week or more. He has not been taking his meds due to him not having refills and no showing to his last HF visit. I called triage and got orders for updated meds stop spironolactone due to last K being elevated, start lasix  40mg  daily. He is needing them picked up from pharmacy- daughter will have a friend take her to get them. I reviewed HF education and sodium, fluid restrictions and things to improve on. He agreed with plans. I plant to meet him in clinic on Monday. Home visit complete.   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   Palliative care by specialist 04/04/2024   Aortic stenosis, severe 04/04/2024   Pleural effusion on left 04/01/2024   Coronary artery disease 03/27/2024   Demand ischemia (HCC) 03/26/2024   Elevated troponin 03/26/2024   Acute kidney injury superimposed on chronic kidney disease 03/26/2024   Transaminitis 03/26/2024   Hyperkalemia 03/26/2024   History of bladder cancer 03/26/2024   Hyperlipidemia 03/26/2024   Generalized weakness 03/26/2024   Bradycardia with 31-40 beats per minute 10/10/2023   Bradycardia 10/10/2023   Scalp abrasion 01/11/2023   Bladder cancer (HCC) 01/11/2023   Coronary artery disease involving native coronary artery of native heart without angina pectoris 05/16/2022   Painless hematuria 01/19/2022   Onychomycosis 08/23/2021   Acute on chronic systolic CHF (congestive heart failure) (HCC) 08/23/2021   PAD (peripheral artery disease) 08/23/2021   Carotid artery disease 08/23/2021  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/2021   Aortic stenosis 07/30/2021   B12 deficiency 07/29/2021   Thrombocytopenia 07/28/2021   Macrocytic anemia 07/28/2021   Seizure-like activity (HCC) 07/27/2021   Generalized abdominal pain 09/30/2020   Loss of weight 09/30/2020   Chronic kidney disease, stage 3a (HCC) 11/21/2019   Transient alteration of awareness 07/25/2018   Syncope 07/25/2018   Hyperlipidemia LDL goal <70 05/28/2015   Medicare annual wellness visit, subsequent 04/20/2015   Erectile dysfunction 04/20/2015   Essential hypertension  04/20/2015    Current Outpatient Medications:    carvedilol  (COREG ) 3.125 MG tablet, Take 1 tablet (3.125 mg total) by mouth 2 (two) times daily with a meal., Disp: 60 tablet, Rfl: 2   furosemide  (LASIX ) 40 MG tablet, Take 1 tablet (40 mg total) by mouth daily., Disp: 30 tablet, Rfl: 2   nitroGLYCERIN  (NITROSTAT ) 0.4 MG SL tablet, Place 1 tablet (0.4 mg total) under the tongue every 5 (five) minutes as needed for chest pain., Disp: 25 tablet, Rfl: 3   rosuvastatin  (CRESTOR ) 10 MG tablet, Take 1 tablet (10 mg total) by mouth daily., Disp: 30 tablet, Rfl: 2   spironolactone  (ALDACTONE ) 25 MG tablet, Take 1 tablet (25 mg total) by mouth daily., Disp: 30 tablet, Rfl: 0   cephALEXin  (KEFLEX ) 500 MG capsule, Take 1 capsule (500 mg total) by mouth 2 (two) times daily. (Patient not taking: Reported on 05/29/2024), Disp: 14 capsule, Rfl: 0 Allergies  Allergen Reactions   Lipitor [Atorvastatin]     Muscle soreness     Social History   Socioeconomic History   Marital status: Married    Spouse name: Not on file   Number of children: 1   Years of education: 12   Highest education level: Not on file  Occupational History   Occupation: Retired  Tobacco Use   Smoking status: Former    Types: Cigars   Smokeless tobacco: Never   Tobacco comments:    Quit 2016  Vaping Use   Vaping status: Never Used  Substance and Sexual Activity   Alcohol use: Yes    Comment: Occasional beer   Drug use: No   Sexual activity: Not Currently  Other Topics Concern   Not on file  Social History Narrative   Lives with wife and daughter lives with them also/2025   Caffeine use: 1-2 cups coffee in the am and 1 glass tea qpm   Social Drivers of Health   Financial Resource Strain: Medium Risk (11/23/2023)   Overall Financial Resource Strain (CARDIA)    Difficulty of Paying Living Expenses: Somewhat hard  Food Insecurity: No Food Insecurity (04/14/2024)   Hunger Vital Sign    Worried About Running Out of Food  in the Last Year: Never true    Ran Out of Food in the Last Year: Never true  Transportation Needs: No Transportation Needs (04/14/2024)   PRAPARE - Administrator, Civil Service (Medical): No    Lack of Transportation (Non-Medical): No  Physical Activity: Inactive (11/23/2023)   Exercise Vital Sign    Days of Exercise per Week: 0 days    Minutes of Exercise per Session: 0 min  Stress: No Stress Concern Present (11/23/2023)   [place] of Occupational Health - Occupational Stress Questionnaire    Feeling of Stress : Only a little  Social Connections: Moderately Isolated (03/26/2024)   Social Connection and Isolation Panel    Frequency of Communication with Friends and Family: Once a week    Frequency  of Social Gatherings with Friends and Family: Never    Attends Religious Services: 1 to 4 times per year    Active Member of [employer] or Organizations: No    Attends Banker Meetings: Never    Marital Status: Married  Catering Manager Violence: Not At Risk (04/14/2024)   Humiliation, Afraid, Rape, and Kick questionnaire    Fear of Current or Ex-Partner: No    Emotionally Abused: No    Physically Abused: No    Sexually Abused: No    Physical Exam      Future Appointments  Date Time Provider Department Center  06/02/2024  3:30 PM [facility] PA/NP [facility] None  06/03/2024  7:00 AM [facility] ROOM [facility] None  06/03/2024  9:00 AM [facility]-CT 1 [facility]-CT [place]  06/03/2024 10:30 AM Prentiss Heddy HERO, RN [facility]-POP[facility] None  06/04/2024  1:00 PM Lanny Callander, MD [facility]-MEDON[facility] None  11/27/2024 10:50 AM [facility]-ANNUAL WELLNESS VISIT [facility] [place] [facility]  04/09/2025 12:50 PM [facility]-ECHO 2 HVC-ECHO H&V  04/09/2025  2:20 PM Sebastian Lamarr SAUNDERS, PA-C [facility]-MAGST H&V

## 2024-05-29 NOTE — Telephone Encounter (Signed)
 Powell is out seeing pt for first time today and is concerned about LE edema, up to his knees bilat, lungs CTA, VS stable (110/62, 54) denies SOB, wt is up about 12 lbs in 2 weeks (119-->131). She states pt was d/c'd in Oct on Lasix  40 mg prn but took it daily until he ran out, also out of all meds for about 2 weeks. Labs done 11/6 with K 5.9 pt advised to report to ER but did not.  Discussed with Caffie Shed, PA she advised restart Lasix  40 mg Daily, Carvedilol  3.125 BID, and crestor  10 mg daily, stay off Spiro. Needs asap appt and labs.   Pt is already sch for post hosp f/u on Mon 11/24. Powell is aware and has advised pt and daughter, med refills sent to [facility] pharmacy, daughter will pick up today. Powell has stressed importance of keeping appt on Mon 11/24

## 2024-05-30 ENCOUNTER — Telehealth (HOSPITAL_COMMUNITY): Payer: Self-pay

## 2024-05-30 ENCOUNTER — Other Ambulatory Visit: Payer: Self-pay

## 2024-05-30 ENCOUNTER — Other Ambulatory Visit (HOSPITAL_COMMUNITY): Payer: Self-pay

## 2024-05-30 NOTE — Telephone Encounter (Signed)
 Called to confirm/remind patient of their appointment at the [facility] on 06/02/2024.   Appointment:   [] Confirmed  [] Left mess   [] No answer/No voice mail  [x] VM Full/unable to leave message  [] Phone not in service  Patient reminded to bring all medications and/or complete list.  Confirmed patient has transportation. Gave directions, instructed to utilize valet parking.

## 2024-06-02 ENCOUNTER — Telehealth (HOSPITAL_COMMUNITY): Payer: Self-pay | Admitting: Licensed Clinical Social Worker

## 2024-06-02 ENCOUNTER — Ambulatory Visit (HOSPITAL_COMMUNITY)
Admission: RE | Admit: 2024-06-02 | Discharge: 2024-06-02 | Disposition: A | Discharge status: Home / Self Care | Source: Ambulatory Visit | Attending: Family Medicine | Admitting: Family Medicine

## 2024-06-02 ENCOUNTER — Other Ambulatory Visit (HOSPITAL_COMMUNITY): Payer: Self-pay

## 2024-06-02 ENCOUNTER — Other Ambulatory Visit: Payer: Self-pay | Admitting: Radiology

## 2024-06-02 ENCOUNTER — Encounter (HOSPITAL_COMMUNITY): Payer: Self-pay

## 2024-06-02 VITALS — BP 149/61 | HR 62 | Ht 64.0 in | Wt 136.2 lb

## 2024-06-02 DIAGNOSIS — D696 Thrombocytopenia, unspecified: Secondary | ICD-10-CM | POA: Diagnosis not present

## 2024-06-02 DIAGNOSIS — I2510 Atherosclerotic heart disease of native coronary artery without angina pectoris: Secondary | ICD-10-CM | POA: Diagnosis not present

## 2024-06-02 DIAGNOSIS — Z8673 Personal history of transient ischemic attack (TIA), and cerebral infarction without residual deficits: Secondary | ICD-10-CM | POA: Diagnosis not present

## 2024-06-02 DIAGNOSIS — I447 Left bundle-branch block, unspecified: Secondary | ICD-10-CM | POA: Diagnosis not present

## 2024-06-02 DIAGNOSIS — I491 Atrial premature depolarization: Secondary | ICD-10-CM | POA: Insufficient documentation

## 2024-06-02 DIAGNOSIS — J90 Pleural effusion, not elsewhere classified: Secondary | ICD-10-CM | POA: Insufficient documentation

## 2024-06-02 DIAGNOSIS — I739 Peripheral vascular disease, unspecified: Secondary | ICD-10-CM | POA: Insufficient documentation

## 2024-06-02 DIAGNOSIS — Z59868 Other specified financial insecurity: Secondary | ICD-10-CM | POA: Diagnosis not present

## 2024-06-02 DIAGNOSIS — N390 Urinary tract infection, site not specified: Secondary | ICD-10-CM | POA: Insufficient documentation

## 2024-06-02 DIAGNOSIS — I25810 Atherosclerosis of coronary artery bypass graft(s) without angina pectoris: Secondary | ICD-10-CM

## 2024-06-02 DIAGNOSIS — Z953 Presence of xenogenic heart valve: Secondary | ICD-10-CM | POA: Diagnosis not present

## 2024-06-02 DIAGNOSIS — I454 Nonspecific intraventricular block: Secondary | ICD-10-CM | POA: Diagnosis not present

## 2024-06-02 DIAGNOSIS — D631 Anemia in chronic kidney disease: Secondary | ICD-10-CM | POA: Diagnosis not present

## 2024-06-02 DIAGNOSIS — I252 Old myocardial infarction: Secondary | ICD-10-CM | POA: Insufficient documentation

## 2024-06-02 DIAGNOSIS — I5022 Chronic systolic (congestive) heart failure: Secondary | ICD-10-CM | POA: Diagnosis not present

## 2024-06-02 DIAGNOSIS — Z79899 Other long term (current) drug therapy: Secondary | ICD-10-CM | POA: Insufficient documentation

## 2024-06-02 DIAGNOSIS — R570 Cardiogenic shock: Secondary | ICD-10-CM | POA: Diagnosis not present

## 2024-06-02 DIAGNOSIS — I5020 Unspecified systolic (congestive) heart failure: Secondary | ICD-10-CM

## 2024-06-02 DIAGNOSIS — N1831 Chronic kidney disease, stage 3a: Secondary | ICD-10-CM | POA: Insufficient documentation

## 2024-06-02 DIAGNOSIS — Z87891 Personal history of nicotine dependence: Secondary | ICD-10-CM | POA: Diagnosis not present

## 2024-06-02 DIAGNOSIS — I255 Ischemic cardiomyopathy: Secondary | ICD-10-CM | POA: Insufficient documentation

## 2024-06-02 DIAGNOSIS — D539 Nutritional anemia, unspecified: Secondary | ICD-10-CM | POA: Diagnosis not present

## 2024-06-02 DIAGNOSIS — Z952 Presence of prosthetic heart valve: Secondary | ICD-10-CM

## 2024-06-02 DIAGNOSIS — Z951 Presence of aortocoronary bypass graft: Secondary | ICD-10-CM | POA: Insufficient documentation

## 2024-06-02 DIAGNOSIS — E875 Hyperkalemia: Secondary | ICD-10-CM | POA: Diagnosis not present

## 2024-06-02 DIAGNOSIS — I130 Hypertensive heart and chronic kidney disease with heart failure and stage 1 through stage 4 chronic kidney disease, or unspecified chronic kidney disease: Secondary | ICD-10-CM | POA: Insufficient documentation

## 2024-06-02 DIAGNOSIS — Z8551 Personal history of malignant neoplasm of bladder: Secondary | ICD-10-CM | POA: Diagnosis not present

## 2024-06-02 LAB — BASIC METABOLIC PANEL WITH GFR
Anion gap: 8 (ref 5–15)
BUN: 31 mg/dL — ABNORMAL HIGH (ref 8–23)
CO2: 23 mmol/L (ref 22–32)
Calcium: 8.7 mg/dL — ABNORMAL LOW (ref 8.9–10.3)
Chloride: 106 mmol/L (ref 98–111)
Creatinine, Ser: 1.38 mg/dL — ABNORMAL HIGH (ref 0.61–1.24)
GFR, Estimated: 49 mL/min — ABNORMAL LOW (ref >60)
Glucose, Bld: 101 mg/dL — ABNORMAL HIGH (ref 70–99)
Potassium: 5.6 mmol/L — ABNORMAL HIGH (ref 3.5–5.1)
Sodium: 137 mmol/L (ref 135–145)

## 2024-06-02 LAB — BRAIN NATRIURETIC PEPTIDE: B Natriuretic Peptide: 1102.9 pg/mL — ABNORMAL HIGH (ref 0.0–100.0)

## 2024-06-02 NOTE — Telephone Encounter (Signed)
 H&V Care Navigation CSW Progress Note  Clinical Social Worker informed by paramedic that pt does not have transport to app today.  CSW able to arrange [employer] taxi to pick up from home at 2:50pm.  Patient informed of above.  SDOH Screenings   Food Insecurity: No Food Insecurity (04/14/2024)  Housing: Unknown (04/14/2024)  Transportation Needs: No Transportation Needs (04/14/2024)  Utilities: Not At Risk (04/14/2024)  Alcohol Screen: Low Risk  (11/23/2023)  Depression (PHQ2-9): Low Risk  (05/02/2024)  Financial Resource Strain: Medium Risk (11/23/2023)  Physical Activity: Inactive (11/23/2023)  Social Connections: Moderately Isolated (03/26/2024)  Stress: No Stress Concern Present (11/23/2023)  Tobacco Use: Medium Risk (04/04/2024)  Health Literacy: Adequate Health Literacy (11/23/2023)    06/02/2024  Danny [name] DOB: 10-Oct-1933 MRN: [id number]   RIDER WAIVER AND RELEASE OF LIABILITY  For the purposes of helping with transportation needs, [facility] partners with outside transportation providers (taxi companies, [place], catering manager.) to give [place] patients or other approved people the choice of on-demand rides Public Librarian) to our buildings for non-emergency visits.  By using [employer], I, the person signing this document, on behalf of myself and/or any legal minors (in my care using the [employer]), agree:  Science Writer given to me are supplied by independent, outside transportation providers who do not work for, or have any affiliation with, [employer]. [place] is not a transportation company. [facility] has no control over the quality or safety of the rides I get using [employer]. [place] has no control over whether any outside ride will happen on time or not. [facility] gives no guarantee on the reliability, quality, safety, or availability on any rides, or that no mistakes will happen. I know and accept that traveling by vehicle (car,  truck, SVU, fleeta, bus, taxi, etc.) has risks of serious injuries such as disability, being paralyzed, and death. I know and agree the risk of using [employer] is mine alone, and not [employer]. [employer] are provided as is and as are available. The transportation providers are in charge for all inspections and care of the vehicles used to provide these rides. I agree not to take legal action against [facility], its agents, employees, officers, directors, representatives, insurers, attorneys, assigns, successors, subsidiaries, and affiliates at any time for any reasons related directly or indirectly to using [employer]. I also agree not to take legal action against [place] or its affiliates for any injury, death, or damage to property caused by or related to using [employer]. I have read this Waiver and Release of Liability, and I understand the terms used in it and their legal meaning. This Waiver is freely and voluntarily given with the understanding that my right (or any legal minors) to legal action against  relating to [employer] is knowingly given up to use these services.   I attest that I read the Ride Waiver and Release of Liability to [employer], gave Danny [name] the opportunity to ask questions and answered the questions asked (if any). I affirm that Danny [name] then provided consent for assistance with transportation.     Danny [name]

## 2024-06-02 NOTE — Progress Notes (Signed)
 Medication Samples have been provided to the patient.  Drug name: LOKELMA        Strength: 10GM        Qty: 1 PACKET  LOT: [id number]  Exp.Date: 01/2026  Dosing instructions: ONLY AS DIRECTED BY THE HF CLINIC  The patient has been instructed regarding the correct time, dose, and frequency of taking this medication, including desired effects and most common side effects.   Danny [name] B Ples Trudel 4:16 PM 06/02/2024

## 2024-06-02 NOTE — Patient Instructions (Addendum)
 Medication Changes:  1 LOKELMA  SAMPLE GIVEN TODAY---ONLY TAKE THIS IF WE CALL AND TELL YOU TO TAKE IT.   RESTART ALL OF YOUR MEDICATIONS   Lab Work:  Labs done today, your results will be available in MyChart, we will contact you for abnormal readings.  Testing/Procedures:  ECHOCARDIOGRAM AS SCHEDULED---TO FOLLOW UP YOUR TAVR                                                                                                                                      Special Instructions // Education:  WEAR COMPRESSION STOCKINGS DAILY   Follow-Up in: 3 WEEKS AS SCHEDULED   At the [facility], you and your health needs are our priority. We have a designated team specialized in the treatment of Heart Failure. This Care Team includes your primary Heart Failure Specialized Cardiologist (physician), Advanced Practice Providers (APPs- Physician Assistants and Nurse Practitioners), and Pharmacist who all work together to provide you with the care you need, when you need it.   You may see any of the following providers on your designated Care Team at your next follow up:  Dr. Toribio Fuel Dr. Ezra Shuck Dr. Odis Brownie Greig Mosses, NP Caffie Shed, [place] [facility] [place], [place] Beckey Coe, NP Jordan Lee, NP Tinnie Redman, PharmD   Please be sure to bring in all your medications bottles to every appointment.   Need to Contact Us :  If you have any questions or concerns before your next appointment please send us  a message through [place] or call our office at [phone].    TO LEAVE A MESSAGE FOR THE NURSE SELECT OPTION 2, PLEASE LEAVE A MESSAGE INCLUDING: YOUR NAME DATE OF BIRTH CALL BACK NUMBER REASON FOR CALL**this is important as we prioritize the call backs  YOU WILL RECEIVE A CALL BACK THE SAME DAY AS LONG AS YOU CALL BEFORE 4:00 PM

## 2024-06-02 NOTE — Progress Notes (Addendum)
 ADVANCED HF CLINIC CONSULT NOTE   Primary Care: [name] LABOR, MD Primary Cardiologist: Dr. Rolan  HPI: 89 y.o. male with history of CAD w/ prior NSTEMI 1/23 s/p CABG (RIMA to LAD, LIMA to OM1, SVG to OM2), ischemic cardiomyopathy, aortic valve stenosis, hx CVA, PAD, hx bladder cancer, CKD IIIa, LBBB.   EF previously 40-45% on LV gram at time of cath/NSTEMI in 1/23. EF improved to 55-60% on follow-up echo.   Echo 6/25: EF 50-55%, RWMA w/ basal inferior and basal inferoseptal severe HK, RV okay, moderate AS with mean gradient of 23 mmHg and AVA 1.1 cm2 by VTI.   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 EF found to be <20%, down from prior 50-55% in 12/2023. Structural/AHF team have assisted with care.Underwent transfemoral TAVR 04/04/24. Discharged home, weight 131 lbs.  Today he returns for post hospital HF follow up with paramedic Heather. Overall feeling fine. He is not SOB walking on flat ground, uses a cane for balance. Helps care for his wife. Legs are swelling. Denies palpitations, abnormal bleeding, CP, dizziness, or PND/Orthopnea. Appetite ok. Weight at home 135 pounds. Taking all medications. Followed by paramedicine. Requires transportation assistance, daughter helps around the house.  ReDs reading: 32 %, normal  ECG (personally reviewed): SR with PAC, IVCD with QRS 150 msec  Labs (11/25): K 5.9, creatinine 1.57  Cardiac Studies - Echo 9/25: EF <20%, G2DD,  RV moderately reduced, LFLG AS with mean gradient  17 mmHg and AVA 0.77 cm by VTI  - Echo 6/25: EF 50-55%, RWMA w/ basal inferior and basal inferoseptal severe HK, RV okay, moderate AS with mean gradient of 23 mmHg and AVA 1.1 cm2 by VTI.    Past Medical History:  Diagnosis Date   (HFpEF) heart failure with preserved ejection fraction (HCC)    Echo 1/23: Inferior AK, mild aortic stenosis (mean 9 mmHg, V-max 210.5 cm/s, DI 0.70),  EF 55-60, GR 1 DD, normal RVSF, normal PASP, trivial MR, mild AI, borderline dilation of aortic root (39 mm)   Acid reflux    hiatal hernia   Anemia    Bladder cancer (HCC)    CAD (coronary artery disease)    S/p non-STEMI 1/23 >> s/p CABG   Carotid artery disease 08/23/2021   Pre-CABG Dopplers 1/23:R ICA 40-59; L ICA 1-39 // Carotid US  07/20/2023: RICA 40-59; LICA 1-39   CHF (congestive heart failure) (HCC)    Chronic kidney disease (CKD)    Coronary artery disease involving native coronary artery of native heart without angina pectoris 05/16/2022   Inf NSTEMI s/p CABG in 07/2021 (RIMA-LAD, LIMA-OM1, S-OM2)   Diverticulitis    Erectile dysfunction 04/20/2015   Essential hypertension 04/20/2015   History of colon polyps    History of kidney stones    History of stroke    Noted on brain MRI 07/2021   Hyperlipidemia 05/28/2015   PAD (peripheral artery disease)    Pre-CABG Dopplers 1/23: Right ABI 0.65; left ABI 0.82   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 Wendel and Dr. Aneita   Skin cancer    Stroke [facility])    noted MRI 2023 pt. unaware    Current Outpatient Medications  Medication Sig Dispense Refill   carvedilol  (COREG ) 3.125 MG tablet Take 1 tablet (3.125 mg total) by mouth 2 (two) times daily with a meal.  60 tablet 2   furosemide  (LASIX ) 40 MG tablet Take 1 tablet (40 mg total) by mouth daily. 30 tablet 2   nitroGLYCERIN  (NITROSTAT ) 0.4 MG SL tablet Place 1 tablet (0.4 mg total) under the tongue every 5 (five) minutes as needed for chest pain. 25 tablet 3   rosuvastatin  (CRESTOR ) 10 MG tablet Take 1 tablet (10 mg total) by mouth daily. 30 tablet 2   cephALEXin  (KEFLEX ) 500 MG capsule Take 1 capsule (500 mg total) by mouth 2 (two) times daily. (Patient not taking: Reported on 06/02/2024) 14 capsule 0   spironolactone  (ALDACTONE ) 25 MG tablet Take 1 tablet (25 mg total) by mouth daily.  (Patient not taking: Reported on 06/02/2024) 30 tablet 0   No current facility-administered medications for this encounter.    Allergies  Allergen Reactions   Lipitor [Atorvastatin]     Muscle soreness      Social History   Socioeconomic History   Marital status: Married    Spouse name: Not on file   Number of children: 1   Years of education: 12   Highest education level: Not on file  Occupational History   Occupation: Retired  Tobacco Use   Smoking status: Former    Types: Cigars   Smokeless tobacco: Never   Tobacco comments:    Quit 2016  Vaping Use   Vaping status: Never Used  Substance and Sexual Activity   Alcohol use: Yes    Comment: Occasional beer   Drug use: No   Sexual activity: Not Currently  Other Topics Concern   Not on file  Social History Narrative   Lives with wife and daughter lives with them also/2025   Caffeine use: 1-2 cups coffee in the am and 1 glass tea qpm   Social Drivers of Health   Financial Resource Strain: Medium Risk (11/23/2023)   Overall Financial Resource Strain (CARDIA)    Difficulty of Paying Living Expenses: Somewhat hard  Food Insecurity: No Food Insecurity (04/14/2024)   Hunger Vital Sign    Worried About Running Out of Food in the Last Year: Never true    Ran Out of Food in the Last Year: Never true  Transportation Needs: No Transportation Needs (04/14/2024)   PRAPARE - Administrator, Civil Service (Medical): No    Lack of Transportation (Non-Medical): No  Physical Activity: Inactive (11/23/2023)   Exercise Vital Sign    Days of Exercise per Week: 0 days    Minutes of Exercise per Session: 0 min  Stress: No Stress Concern Present (11/23/2023)   [employer]ational [employer] - Occupational Stress Questionnaire    Feeling of Stress : Only a little  Social Connections: Moderately Isolated (03/26/2024)   Social Connection and Isolation Panel    Frequency of Communication with Friends and Family: Once a  week    Frequency of Social Gatherings with Friends and Family: Never    Attends Religious Services: 1 to 4 times per year    Active Member of [employer] or Organizations: No    Attends Banker Meetings: Never    Marital Status: Married  Catering Manager Violence: Not At Risk (04/14/2024)   Humiliation, Afraid, Rape, and Kick questionnaire    Fear of Current or Ex-Partner: No    Emotionally Abused: No    Physically Abused: No    Sexually Abused: No   Family History  Problem Relation Age of Onset   Stroke Father 63   Sudden death Mother 72  unknown cause   Wt Readings from Last 3 Encounters:  06/02/24 61.8 kg (136 lb 3.2 oz)  05/29/24 59.4 kg (131 lb)  05/15/24 54.3 kg (119 lb 9.6 oz)   BP (!) 149/61   Pulse 62   Ht 5' 4 (1.626 m)   Wt 61.8 kg (136 lb 3.2 oz)   SpO2 100%   BMI 23.38 kg/m   PHYSICAL EXAM: General:  NAD. No resp difficulty, walked into clinic with cane, elderly HEENT: Normal Neck: Supple. No JVD. Cor: Regular rate & rhythm. No rubs, gallops or murmurs. Lungs: Clear Abdomen: Soft, nontender, nondistended.  Extremities: No cyanosis, clubbing, rash, 1-2+ BLE edema Neuro: Alert & oriented x 3, moves all 4 extremities w/o difficulty. Affect pleasant.  ASSESSMENT & PLAN: 1. Chronic systolic CHF with cardiogenic shock, pleural effusions requiring thoracentesis: Echo 6/25 EF EF 50-55% w/ reported moderate AS. Admitted 9/25 with CGS, with LFLG AS. Echo showed EF < 20%, severe AS. Underwent TAVR 04/05/24, POD1 echo showed EF up to 35-40% bioprosthetic aortic valve functioning normally. Low EF at admission may be due to combination of severe AS and LBBB.  Today, he is NYHA II, he is mildly volume overloaded today, weight is up. ReDs 32%, he just picked up his medications from the pharmacy today. - Continue Coreg  3.25 mg bid - Continue Lasix  40 mg daily. BMET and BNP today - Plan to add ARB/ARNi next visit. - Off spiro with K of 5.9. Will give sample of  Lokelma  today in case labs show continued hyperkalemia - Off SGLT2i for now with confirmed UTI - Place compression hose 2. Severe AS: s/p TAVR 04/04/24. Echo 09/27 with EF up to 35-40% and normal functioning bioprosthetic aortic valve functioning - no antiplatelets in setting of anemia/thrombocytopenia - Will reach out to Structural Heart Team, we can help coordinate repeat echo with paramedicine's assistance. 3. CAD: NSTEMI 2023 s/p CABG X 3. Per notes cardiac CT demonstrated patent RIMA-LAD and LIMA-OM1 as well as an occluded SVG-->OM which was not felt to explain his cardiomyopathy, angiography deferred. No chest pain - Continue Crestor  10 mg daily. - No aspirin  with significant anemia and thrombocytopenia.  4. Chronic macrocytic anemia: denies over bleeding. Hgb averaging in 7s - Follows with Dr. Lanny. 5. CKD IIIa: Baseline Scr 1.2-1.3. - BMET today 6. Question of Afib this admission: EKGs has shown NSR/SB with LBBB, will need to follow conduction. - NSR with PACs on ECG today. He does have IVCD. - Not a great candidate for [facility] given anemia/thrombocytopenia.  7. Thrombocytopenia: Platelets have continued to trend down, 106K on admit >>30s>>>>now 70 - Not on any form of heparin  at this time.  - Heme/Onc felt  thromboctyopenia likely 2/2 severe AS and recent TAVR.  - He has BMBx tomorrow.    Follow up in 3 weeks with APP for fluid check and GDMT titration.  Harlene Gainer, FNP-BC 06/02/24

## 2024-06-02 NOTE — Progress Notes (Signed)
 Paramedicine Encounter  Patient ID: Danny [name], male, DOB: 03-Nov-1933, 89 y.o.,  MRN: [id number]  Met patient in clinic today with provider. [name] reports feeling fine other than his legs swelling and his weight is up. He denied shortness of breath, denied dizziness or chest pain. I got his meds ordered last week on Thursday his daughter never picked them up due to transportation, [facility] sent them out for delivery- [employer] just delivered them today at 0900 so he has not taken any meds since I saw him last week. I plan to follow up in one week in the home. He agreed with plan. Clinic visit complete.   Weight @ clinic- 136lbs  Weight @ home- 131lbs (Thursday last week)   B/P- 110/62   P- 54   SP02-   REDS CLIP-  32%   Med changes-  Lasix  40mg  daily, Carvedilol  3.125mg  BID, Crestor  10mg  nightly, wear compression socks daily and remove at night.   Social Changes- still using uber or taxis for appointments trying to get a new car.    Powell Mirza, EMT-Paramedic [phone] 06/02/2024

## 2024-06-02 NOTE — Progress Notes (Signed)
 ReDS Vest / Clip - 06/02/24 1534       ReDS Vest / Clip   Station Marker A    Ruler Value 27    ReDS Value Range Moderate volume overload    ReDS Actual Value 32

## 2024-06-03 ENCOUNTER — Other Ambulatory Visit: Payer: Self-pay

## 2024-06-03 ENCOUNTER — Ambulatory Visit (HOSPITAL_COMMUNITY): Payer: Self-pay | Admitting: Family Medicine

## 2024-06-03 ENCOUNTER — Telehealth: Payer: Self-pay

## 2024-06-03 ENCOUNTER — Ambulatory Visit (HOSPITAL_COMMUNITY)
Admission: RE | Admit: 2024-06-03 | Discharge: 2024-06-03 | Disposition: A | Discharge status: Home / Self Care | Source: Ambulatory Visit | Attending: Hematology | Admitting: Hematology

## 2024-06-03 ENCOUNTER — Encounter (HOSPITAL_COMMUNITY): Payer: Self-pay

## 2024-06-03 DIAGNOSIS — D631 Anemia in chronic kidney disease: Secondary | ICD-10-CM | POA: Insufficient documentation

## 2024-06-03 DIAGNOSIS — Z860100 Personal history of colon polyps, unspecified: Secondary | ICD-10-CM | POA: Diagnosis not present

## 2024-06-03 DIAGNOSIS — Z85828 Personal history of other malignant neoplasm of skin: Secondary | ICD-10-CM | POA: Diagnosis not present

## 2024-06-03 DIAGNOSIS — I509 Heart failure, unspecified: Secondary | ICD-10-CM | POA: Insufficient documentation

## 2024-06-03 DIAGNOSIS — Z951 Presence of aortocoronary bypass graft: Secondary | ICD-10-CM | POA: Diagnosis not present

## 2024-06-03 DIAGNOSIS — K5790 Diverticulosis of intestine, part unspecified, without perforation or abscess without bleeding: Secondary | ICD-10-CM | POA: Diagnosis not present

## 2024-06-03 DIAGNOSIS — I252 Old myocardial infarction: Secondary | ICD-10-CM | POA: Diagnosis not present

## 2024-06-03 DIAGNOSIS — Z1379 Encounter for other screening for genetic and chromosomal anomalies: Secondary | ICD-10-CM | POA: Insufficient documentation

## 2024-06-03 DIAGNOSIS — I739 Peripheral vascular disease, unspecified: Secondary | ICD-10-CM | POA: Diagnosis not present

## 2024-06-03 DIAGNOSIS — Z8551 Personal history of malignant neoplasm of bladder: Secondary | ICD-10-CM | POA: Insufficient documentation

## 2024-06-03 DIAGNOSIS — Z87442 Personal history of urinary calculi: Secondary | ICD-10-CM | POA: Diagnosis not present

## 2024-06-03 DIAGNOSIS — Z8673 Personal history of transient ischemic attack (TIA), and cerebral infarction without residual deficits: Secondary | ICD-10-CM | POA: Diagnosis not present

## 2024-06-03 DIAGNOSIS — Z952 Presence of prosthetic heart valve: Secondary | ICD-10-CM | POA: Insufficient documentation

## 2024-06-03 DIAGNOSIS — I5020 Unspecified systolic (congestive) heart failure: Secondary | ICD-10-CM

## 2024-06-03 DIAGNOSIS — E785 Hyperlipidemia, unspecified: Secondary | ICD-10-CM | POA: Diagnosis not present

## 2024-06-03 DIAGNOSIS — D696 Thrombocytopenia, unspecified: Secondary | ICD-10-CM | POA: Diagnosis not present

## 2024-06-03 DIAGNOSIS — N189 Chronic kidney disease, unspecified: Secondary | ICD-10-CM | POA: Insufficient documentation

## 2024-06-03 DIAGNOSIS — I2510 Atherosclerotic heart disease of native coronary artery without angina pectoris: Secondary | ICD-10-CM | POA: Diagnosis not present

## 2024-06-03 DIAGNOSIS — D649 Anemia, unspecified: Secondary | ICD-10-CM | POA: Diagnosis not present

## 2024-06-03 DIAGNOSIS — K219 Gastro-esophageal reflux disease without esophagitis: Secondary | ICD-10-CM | POA: Insufficient documentation

## 2024-06-03 DIAGNOSIS — I130 Hypertensive heart and chronic kidney disease with heart failure and stage 1 through stage 4 chronic kidney disease, or unspecified chronic kidney disease: Secondary | ICD-10-CM | POA: Diagnosis not present

## 2024-06-03 LAB — CBC WITH DIFFERENTIAL/PLATELET
Abs Immature Granulocytes: 0.01 K/uL (ref 0.00–0.07)
Basophils Absolute: 0 K/uL (ref 0.0–0.1)
Basophils Relative: 0 %
Eosinophils Absolute: 0 K/uL (ref 0.0–0.5)
Eosinophils Relative: 1 %
HCT: 22.6 % — ABNORMAL LOW (ref 39.0–52.0)
Hemoglobin: 7.2 g/dL — ABNORMAL LOW (ref 13.0–17.0)
Immature Granulocytes: 0 %
Lymphocytes Relative: 32 %
Lymphs Abs: 1.2 K/uL (ref 0.7–4.0)
MCH: 35.6 pg — ABNORMAL HIGH (ref 26.0–34.0)
MCHC: 31.9 g/dL (ref 30.0–36.0)
MCV: 111.9 fL — ABNORMAL HIGH (ref 80.0–100.0)
Monocytes Absolute: 0.1 K/uL (ref 0.1–1.0)
Monocytes Relative: 2 %
Neutro Abs: 2.4 K/uL (ref 1.7–7.7)
Neutrophils Relative %: 65 %
Platelets: 78 K/uL — ABNORMAL LOW (ref 150–400)
RBC: 2.02 MIL/uL — ABNORMAL LOW (ref 4.22–5.81)
RDW: 22 % — ABNORMAL HIGH (ref 11.5–15.5)
Smear Review: NORMAL
WBC: 3.7 K/uL — ABNORMAL LOW (ref 4.0–10.5)
nRBC: 0 % (ref 0.0–0.2)

## 2024-06-03 MED ORDER — SODIUM CHLORIDE 0.9 % IV SOLN: INTRAVENOUS | Status: DC

## 2024-06-03 NOTE — Telephone Encounter (Signed)
 Noted need for Lokelma  will notify patient and his daughter to take same.   Powell Mirza, EMT-Paramedic [phone] 06/03/2024

## 2024-06-03 NOTE — Addendum Note (Signed)
 Encounter addended by: Jaia Alonge K, PA-C on: 06/03/2024 9:07 AM  Actions taken: Delete clinical note

## 2024-06-03 NOTE — Sedation Documentation (Signed)
 Pt unsedated

## 2024-06-03 NOTE — Progress Notes (Signed)
 Patient ID: Danny [name], male   DOB: December 17, 1933, 89 y.o.   MRN: [id number] Mr. Russey is an 89 y.o. male with past medical history of heart failure, GERD, bladder cancer, coronary artery disease with prior MI/CABG, carotid artery disease, chronic kidney disease, diverticulitis, hypertension, colon polyps, nephrolithiasis, prior stroke, hyperlipidemia, peripheral arterial disease, prior TAVR, skin cancer as well as anemia of chronic disease and thrombocytopenia.  He is scheduled today for image guided bone marrow biopsy to rule out myelodysplastic syndrome. Risks and benefits of procedure was discussed with the patient  including, but not limited to bleeding, infection, damage to adjacent structures or low yield requiring additional tests.  All of the questions were answered and there is agreement to proceed.  Consent signed and in chart. Since pt does not have family available to drive him home postprocedure will plan to perform biopsy with local anesthetic only.

## 2024-06-03 NOTE — Procedures (Signed)
 Vascular and Interventional Radiology Procedure Note  Patient: Danny [name] DOB: 1933-11-20 Medical Record Number: [id number] Note Date/Time: 06/03/24 9:51 AM   Performing Physician: Thom Hall, MD Assistant(s): None  Diagnosis: Anemia + Thrombocytopenia   Procedure: BONE MARROW ASPIRATION and BIOPSY  Anesthesia: Local Anesthetic Complications: None Estimated Blood Loss: Minimal Specimens: Sent for Pathology  Findings:  Successful CT-guided bone marrow aspiration and biopsy A total of 1 cores were obtained. Hemostasis of the tract was achieved using Manual Pressure.  Plan: Bed rest for 30 minutes.  See detailed procedure note with images in PACS. The patient tolerated the procedure well without incident or complication and was returned to Recovery in stable condition.    Thom Hall, MD Vascular and Interventional Radiology Special[facility] Radiology   Pager. [phone] Clinic. [phone]

## 2024-06-04 ENCOUNTER — Inpatient Hospital Stay: Admitting: Hematology

## 2024-06-04 NOTE — Addendum Note (Signed)
 Addended by: BUELL POWELL HERO on: 06/04/2024 02:50 PM   Modules accepted: Orders

## 2024-06-06 ENCOUNTER — Inpatient Hospital Stay: Admitting: Hematology

## 2024-06-06 ENCOUNTER — Telehealth: Payer: Self-pay

## 2024-06-06 DIAGNOSIS — D696 Thrombocytopenia, unspecified: Secondary | ICD-10-CM | POA: Diagnosis not present

## 2024-06-06 DIAGNOSIS — I5043 Acute on chronic combined systolic (congestive) and diastolic (congestive) heart failure: Secondary | ICD-10-CM | POA: Diagnosis not present

## 2024-06-06 DIAGNOSIS — I739 Peripheral vascular disease, unspecified: Secondary | ICD-10-CM | POA: Diagnosis not present

## 2024-06-06 DIAGNOSIS — D631 Anemia in chronic kidney disease: Secondary | ICD-10-CM | POA: Diagnosis not present

## 2024-06-06 DIAGNOSIS — N179 Acute kidney failure, unspecified: Secondary | ICD-10-CM | POA: Diagnosis not present

## 2024-06-06 DIAGNOSIS — C679 Malignant neoplasm of bladder, unspecified: Secondary | ICD-10-CM | POA: Diagnosis not present

## 2024-06-06 DIAGNOSIS — D630 Anemia in neoplastic disease: Secondary | ICD-10-CM | POA: Diagnosis not present

## 2024-06-06 DIAGNOSIS — N1832 Chronic kidney disease, stage 3b: Secondary | ICD-10-CM | POA: Diagnosis not present

## 2024-06-06 DIAGNOSIS — I130 Hypertensive heart and chronic kidney disease with heart failure and stage 1 through stage 4 chronic kidney disease, or unspecified chronic kidney disease: Secondary | ICD-10-CM | POA: Diagnosis not present

## 2024-06-06 NOTE — Telephone Encounter (Signed)
 Spoke with pt's daughter to inform them that Dr Lanny would like to reschedule their appts today d/t biopsy results are not back.  Pt's daughter stated OK.  Stated Dr Demetra scheduler will contact them to get them rescheduled.

## 2024-06-09 ENCOUNTER — Telehealth (HOSPITAL_COMMUNITY): Payer: Self-pay

## 2024-06-09 ENCOUNTER — Other Ambulatory Visit: Payer: Self-pay

## 2024-06-09 ENCOUNTER — Other Ambulatory Visit (HOSPITAL_COMMUNITY): Payer: Self-pay

## 2024-06-09 NOTE — Telephone Encounter (Signed)
 Danny [name] has no vehicle or no one to take him to apts, his insurance rides expired on (11/24) as he only had 8 after his admission.   He has labs on Thursday 12/4 at 2:15 in the HF clinic.   Feel free to follow up with me or him and or his daughter about ride.   Thanks.   Powell Mirza, EMT-Paramedic [phone] 06/09/2024

## 2024-06-09 NOTE — Patient Instructions (Addendum)
 Visit Information  Thank you for taking time to visit with me today. Please don't hesitate to contact me if I can be of assistance to you before our next scheduled appointment.  Our next appointment is by telephone on 06/25/24 at 1:30 pm  Please call the care guide team at [phone] if you need to cancel or reschedule your appointment.   Following is a copy of your care plan:   Goals Addressed             This Visit's Progress    VBCI RN Care Plan       Problems:  Chronic Disease Management support and education needs related to CHF Per patient no advance directive-patient request RNCM send packet.   Goal: Over the next 90 days the Patient will continue to work with RN Care Manager and/or Social Worker to address care management and care coordination needs related to CHF as evidenced by adherence to care management team scheduled appointments     demonstrate Improved adherence to prescribed treatment plan for CHF as evidenced by patient report and/or review of chart.  Interventions:   Heart Failure Interventions: Assessed need for readable accurate scales in home Discussed importance of daily weight and advised patient to weigh and record daily Reviewed role of diuretics in prevention of fluid overload and management of heart failure; Discussed the importance of keeping all appointments with provider Assessed social determinant of health barriers  Reviewed signs/symptoms of HF exacerbation Reviewed upcoming provider appointments with patient. Confirmed patient is active with Paramedicine team Reviewed medications with patient and confirmed medications pill box is being filled/managed by Paramedicine team. Advance Directive packet sent Education provided regarding Advance directive and Heart Failure self care  Patient Self-Care Activities:  Attend all scheduled provider appointments Call provider office for new concerns or questions  Take medications as prescribed    weigh myself daily Monitor for signs/symptoms of Fluid volume overload  Plan:  Telephone follow up appointment with care management team member scheduled for:  06/25/24 at 1:30 pm        Please call the Suicide and Crisis Lifeline: 988 call the [place]  National Suicide Prevention Lifeline: 330-643-0694 or TTY: 534 612 2268 TTY [phone]) to talk to a trained counselor if you are experiencing a Mental Health or Behavioral Health Crisis or need someone to talk to.  Patient verbalized understanding of Care plan and visit instructions communicated this visit. Patient agrees to review care plan and instructions in MyChart.  Heddy Shutter, RN, MSN, BSN, CCM [place]  [facility], Population Health Case Manager Phone: [phone]   Heart Failure: Self-Care Heart failure is a serious condition. The information below explains things you need to do to take care of yourself at home. To help you stay as healthy as possible, you may be asked to change your diet, take certain medicines, and make other changes in your life. Your health care provider may also give you more instructions. If you have problems or questions, call your provider. What are the risks? Having heart failure makes it more likely for you to have some problems. These can get worse if you don't take good care of yourself. Problems may include: Damage to the kidneys, liver, or lungs. Not getting enough nutrients. This is called malnutrition. Heart rhythms that aren't normal. Problems with blood clotting that could cause a stroke. Supplies needed: A scale to keep track of your weight. A blood pressure monitor. A notebook. Medicines. How to care for yourself when you have  heart failure Medicines Take your medicines only as told. Take them every day. Do not stop taking your medicine unless your provider tells you to. Do not skip any dose of medicine. Get your prescriptions refilled before you run out of  medicine. Talk with your health care provider if you can't afford your medicines. Eating and drinking  Eat heart-healthy foods. Talk with an expert in healthy eating called a dietitian. They can help make an eating plan that's right for you. Limit salt (sodium) if told by your provider. Ask your dietitian to tell you which seasonings are healthy for your heart. Cook in healthy ways instead of frying. Healthy ways of cooking include roasting, grilling, broiling, baking, poaching, steaming, and stir-frying. Choose foods that have no trans fat and are low in saturated fat and cholesterol. Healthy choices include: Fresh or frozen fruits and vegetables. Fish. Lean meats. Legumes. Fat-free or low-fat dairy products. Whole-grain foods. High-fiber foods. Limit how much fluid you drink, if told by your provider. This may help with symptoms. Alcohol use Do not drink alcohol if: Your provider tells you not to drink. Your heart was damaged by alcohol, or you have very bad heart failure. You're pregnant, may be pregnant, or plan to become pregnant. If you drink alcohol: Limit how much you have to: 0-1 drink a day if you're male. 0-2 drinks a day if you're male. Know how much alcohol is in your drink. In the [place] one drink is one 12 oz bottle of beer (355 mL), one 5 oz glass of wine (148 mL), or one 1 oz glass of hard liquor (44 mL). Lifestyle Do not smoke, vape, or use nicotine or tobacco. Do not use nicotine gum or patches before talking to your provider. Do not use illegal drugs. Work with your provider to stay at a healthy body weight. Lose weight if told. Do physical activity if told by your provider. Talk to your provider before you begin an exercise if: You're an older adult. You have very bad heart failure. Learn to manage stress. If you need help, ask your provider. Get physical rehabilitation, also called rehab, as needed to help you stay independent and to help with your quality  of life. Take part in a cardiac rehab program. This program helps to improve your health and well-being through exercise training, education, and counseling. Plan time to rest when you get tired. Tracking important information  Weigh yourself every day. This will help you to know if fluid is building up in your body. Weigh yourself every morning after you pee (urinate) and before you eat breakfast. Wear the same amount of clothing each time you weigh yourself. Write down your daily weight. Give your record to your provider. Check and write down your pulse and blood pressure as told by your provider. Dealing with very hot and very cold weather If the weather is very hot: Avoid activities that take a lot of energy. Use air conditioning or fans, or find a cooler place. Avoid caffeine and alcohol. Wear clothes that are loose, lightweight, and light-colored. If the weather is very cold: Avoid activities that take a lot of energy. Layer your clothes. Wear mittens or gloves, a hat, and a face covering when you go outside. Avoid alcohol. Other tips Stay up to date with shots, or vaccines. Get pneumococcal and flu (influenza) shots. Keep all follow-up visits. Your provider will watch your symptoms and change your treatment plan as needed. Where to find more information American Heart Association:  thisjobs.cz Contact a health care provider if: You gain 2-3 lb (1-1.4 kg) in 24 hours or 5 lb (2.3 kg) in a week. You have shortness of breath that's getting worse. You're not able to do your usual physical activities. You get tired easily. You cough a lot. You don't feel like eating or you feel like you may throw up. You have swelling in your hands, feet, ankles, or belly. You're not able to sleep because it's hard to breathe. You feel like your heart is beating fast. This is called palpitations. You get dizzy when you stand up. You feel depressed or sad. Get help right away if: You have trouble  breathing. You or someone else notices a change in your awareness, such as having trouble staying awake or concentrating. You have pain or discomfort in your chest. You faint. These symptoms may be an emergency. Call 911 right away. Do not wait to see if the symptoms will go away. Do not drive yourself to the hospital. This information is not intended to replace advice given to you by your health care provider. Make sure you discuss any questions you have with your health care provider. Document Revised: 05/08/2023 Document Reviewed: 12/01/2022 Elsevier Patient Education  2024 [employer].   Advance Directive  Advance directives are legal papers that state your wishes about health care decisions. They let your wishes be known to family, friends, and health care providers if you become unable to speak for yourself.  You should write these papers out over time rather than all at once. They can be changed and updated at any time. The types of advance directives include: Medical power of attorney (POA). Living wills. Do not resuscitate (DNR) or do not attempt resuscitation (DNAR) orders. What are a health care proxy and medical POA? A health care proxy is also called a health care agent. It's a person you choose to make medical decisions for you when you can't make them for yourself. In most cases, a proxy is a trusted friend or family member. A medical POA is legal paperwork that names your proxy. It may need to be: Signed. Notarized. Dated. Copied. Witnessed. Added to your medical record. You may also want to choose someone to handle your money if you can't do so. This is called a durable POA for finances. It's separate from a medical POA. You may choose your health care proxy or someone else to act as your agent in money matters. If you don't have a proxy, or if the proxy may not be acting in your best interest, a court may choose a guardian to act on your behalf. What is a living  will? A living will is legal paperwork that states your wishes about medical care. Providers should keep a copy of it in your medical record. You may want to give a copy to family members or friends. You can also keep a card in your wallet to let loved ones know you have a living will and where they can find it. A living will may be used if: You're very sick with something that will end your life. You become disabled. You can't make decisions or speak for yourself. Your living will should include whether: To use or not use life support equipment. This may include machines to filter your blood or to help you breathe. You want a DNR or DNAR order. This tells providers not to use CPR if your heart or breathing stops. To use or not use tube  feeding. You want to be given foods and fluids. You want a type of comfort care called palliative care. This may be given when the goal for treatment becomes comfort rather than a cure. You want to donate your organs and tissues. A living will doesn't say what to do with your money and property if you pass away. What is a DNR or DNAR? A DNR or DNAR order is a request not to have CPR. If you don't have one of these orders, a provider will try to help you if your heart stops or you stop breathing.  If you plan to have surgery, talk with your provider about your DNR or DNAR order. What happens if I don't have an advance directive? Each state has its own laws about advance directives. Some states assign family decision makers to act on your behalf if you don't have an advance directive.  Check with your provider, attorney, or state representative about the laws in your state. Where to find more information Each state has its own laws about advance directives. You can look up these laws at: https://rodriguez-phillips.com/ This information is not intended to replace advice given to you by your health care provider. Make sure you discuss any questions you have with your health care  provider. Document Revised: 11/13/2022 Document Reviewed: 11/13/2022 Elsevier Patient Education  2024 [employer].

## 2024-06-09 NOTE — Patient Outreach (Signed)
 Complex Care Management   Visit Note  06/09/2024  Name:  Danny [name] MRN: [id number] DOB: [birth year]  Situation: Referral received for Complex Care Management related to Heart Failure I obtained verbal consent from Patient.  Visit completed with Patient  on the phone  Background:   Past Medical History:  Diagnosis Date   (HFpEF) heart failure with preserved ejection fraction (HCC)    Echo 1/23: Inferior AK, mild aortic stenosis (mean 9 mmHg, V-max 210.5 cm/s, DI 0.70), EF 55-60, GR 1 DD, normal RVSF, normal PASP, trivial MR, mild AI, borderline dilation of aortic root (39 mm)   Acid reflux    hiatal hernia   Anemia    Bladder cancer (HCC)    CAD (coronary artery disease)    S/p non-STEMI 1/23 >> s/p CABG   Carotid artery disease 08/23/2021   Pre-CABG Dopplers 1/23:R ICA 40-59; L ICA 1-39 // Carotid US  07/20/2023: RICA 40-59; LICA 1-39   CHF (congestive heart failure) (HCC)    Chronic kidney disease (CKD)    Coronary artery disease involving native coronary artery of native heart without angina pectoris 05/16/2022   Inf NSTEMI s/p CABG in 07/2021 (RIMA-LAD, LIMA-OM1, S-OM2)   Diverticulitis    Erectile dysfunction 04/20/2015   Essential hypertension 04/20/2015   History of colon polyps    History of kidney stones    History of stroke    Noted on brain MRI 07/2021   Hyperlipidemia 05/28/2015   PAD (peripheral artery disease)    Pre-CABG Dopplers 1/23: Right ABI 0.65; left ABI 0.82   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 Wendel and Dr. Aneita   Skin cancer    Stroke [facility])    noted MRI 2023 pt. unaware    Assessment: Patient Reported Symptoms:  Cognitive Cognitive Status: Alert and oriented to person, place, and time (patient able to state: name, DOB and year. per review of chart, patient forgetful.)      Neurological Neurological Review of Symptoms: No symptoms reported    HEENT  HEENT Symptoms Reported: No symptoms reported      Cardiovascular Cardiovascular Symptoms Reported: No symptoms reported Does patient have uncontrolled Hypertension?: No (patient reports running 120's/60's) Cardiovascular Management Strategies: Medication therapy, Adequate rest, Activity, Routine screening, Exercise Weight: 135 lb (61.2 kg) Cardiovascular Self-Management Outcome: 5 (very good) Cardiovascular Comment: Patient denies any signs/symptoms of HF exacerbation. Paitent reports he is wearing complression socks and denies any swellilng or edema. Patient states Heather with [facility] team is scheduled to make a home visit today.  Respiratory Respiratory Symptoms Reported: No symptoms reported    Endocrine Endocrine Symptoms Reported: No symptoms reported Is patient diabetic?: No    Gastrointestinal Gastrointestinal Symptoms Reported: No symptoms reported      Genitourinary Genitourinary Symptoms Reported: No symptoms reported    Integumentary Integumentary Symptoms Reported: No symptoms reported Additional Integumentary Details: reports a little bruising on arm from IV, but reports healing up nicely    Musculoskeletal Musculoskelatal Symptoms Reviewed: No symptoms reported        Psychosocial Psychosocial Symptoms Reported: No symptoms reported     Quality of Family Relationships: supportive, involved Do you feel physically threatened by others?: No    Today's Vitals   06/09/24 1134  Weight: 135 lb (61.2 kg)   Pain Scale: 0-10 Pain Score: 0-No pain  Medications Reviewed Today     Reviewed by Scout Gumbs M, RN (  Registered Nurse) on 06/09/24 at 1110  Med List Status: <None>   Medication Order Taking? Sig Documenting Provider Last Dose Status Informant  carvedilol  (COREG ) 3.125 MG tablet [id number] Yes Take 1 tablet (3.125 mg total) by mouth 2 (two) times daily with a meal. Marcine Caffie HERO, PA-C  Active   furosemide  (LASIX ) 40 MG tablet [id number] Yes Take  1 tablet (40 mg total) by mouth daily. Marcine Caffie M, PA-C  Active   nitroGLYCERIN  (NITROSTAT ) 0.4 MG SL tablet [id number] Yes Place 1 tablet (0.4 mg total) under the tongue every 5 (five) minutes as needed for chest pain. Lelon Hamilton T, PA-C  Active Self  rosuvastatin  (CRESTOR ) 10 MG tablet [id number] Yes Take 1 tablet (10 mg total) by mouth daily. Marcine Caffie HERO, PA-C  Active            Recommendation:   Specialty provider follow-up 06/20/24 oncology, 06/24/24 Advanced HF clinic,07/04/24 echo Last PCP visit 10/2022. Patient states he will speak with daughter to schedule a follow up/annual visit.  Follow Up Plan:   Telephone follow up appointment date/time:  06/25/24 at 1:30 pm  Heddy Shutter, RN, MSN, BSN, CCM [place]  [facility], Population Health Case Manager Phone: [phone]

## 2024-06-09 NOTE — Progress Notes (Signed)
 Paramedicine Encounter    Patient ID: Danny [name], male    DOB: 1933-08-14, 89 y.o.   MRN: [id number]   Complaints- lower leg edema   Assessment- CAOx4, warm and dry seated in living room, no complaints other than leg swelling, no fatigue, no shortness of breath, no dizziness or chest pain. Lungs clear, +3 edema up to knees in both legs, wearing compression socks intermittently. Weight is 135lbs continuing to be elevated.   Compliance with meds- compliant   Pill box filled- for one week   Refills needed- none   Meds changes since last visit- took lokelma  on Tuesday last week     Social changes- no transportation now as his insurance rides expired (only 8 rides after a hospital stay)    VISIT SUMMARY- Arrived for home visit for [facility] who reports to be feeling well with no complaints other than lower leg swelling. He continues to have edema up to his knees despite increase in lasix  and compression socks. He has been med compliant. He denied any shortness of breath, no chest pain, no dizziness. Lungs clear. He will have labs rechecked this week on Thursday- he was given Lokelma  last Tuesday. I will follow up after labs. I educated him on fluid and salt intake, I also reminded him to wear compression socks and to elevate his legs.   I will see if Jenna can assist with transportation as he has no vehicle or no one to take him to apts, his insurance rides expired as he only had 8 after his admission.   BP 128/60   Pulse (!) 50   Resp 16   Wt 135 lb (61.2 kg)   SpO2 99%   BMI 23.17 kg/m  Weight yesterday-- 135lbs  Last visit weight-- 136lbs      ACTION: Home visit completed     Patient Care Team: [name] LABOR, MD as PCP - General (Internal Medicine) Wendel Lurena POUR, MD as PCP - Structural Heart (Cardiology) Lanny Callander, MD as Consulting Physician (Hematology) Prentiss Heddy HERO, RN as [facility] Care Management  Patient Active Problem List   Diagnosis Date Noted   S/P  TAVR (transcatheter aortic valve replacement) 04/04/2024   Palliative care by specialist 04/04/2024   Aortic stenosis, severe 04/04/2024   Pleural effusion on left 04/01/2024   Coronary artery disease 03/27/2024   Demand ischemia (HCC) 03/26/2024   Elevated troponin 03/26/2024   Acute kidney injury superimposed on chronic kidney disease 03/26/2024   Transaminitis 03/26/2024   Hyperkalemia 03/26/2024   History of bladder cancer 03/26/2024   Hyperlipidemia 03/26/2024   Generalized weakness 03/26/2024   Bradycardia with 31-40 beats per minute 10/10/2023   Bradycardia 10/10/2023   Scalp abrasion 01/11/2023   Bladder cancer (HCC) 01/11/2023   Coronary artery disease involving native coronary artery of native heart without angina pectoris 05/16/2022   Painless hematuria 01/19/2022   Onychomycosis 08/23/2021   Acute on chronic systolic CHF (congestive heart failure) (HCC) 08/23/2021   PAD (peripheral artery disease) 08/23/2021   Carotid artery disease 08/23/2021   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/2021   Aortic stenosis 07/30/2021   B12 deficiency 07/29/2021   Thrombocytopenia 07/28/2021   Macrocytic anemia 07/28/2021   Seizure-like activity (HCC) 07/27/2021   Generalized abdominal pain 09/30/2020   Loss of weight 09/30/2020   Chronic kidney disease, stage 3a (HCC) 11/21/2019   Transient alteration of awareness 07/25/2018   Syncope 07/25/2018   Hyperlipidemia LDL goal <70 05/28/2015   Medicare annual wellness visit, subsequent  04/20/2015   Erectile dysfunction 04/20/2015   Essential hypertension 04/20/2015    Current Outpatient Medications:    carvedilol  (COREG ) 3.125 MG tablet, Take 1 tablet (3.125 mg total) by mouth 2 (two) times daily with a meal., Disp: 60 tablet, Rfl: 2   furosemide  (LASIX ) 40 MG tablet, Take 1 tablet (40 mg total) by mouth daily., Disp: 30 tablet, Rfl: 2   nitroGLYCERIN  (NITROSTAT ) 0.4 MG SL tablet, Place 1 tablet (0.4 mg total) under the tongue every 5 (five) minutes as  needed for chest pain., Disp: 25 tablet, Rfl: 3   rosuvastatin  (CRESTOR ) 10 MG tablet, Take 1 tablet (10 mg total) by mouth daily., Disp: 30 tablet, Rfl: 2 Allergies  Allergen Reactions   Lipitor [Atorvastatin]     Muscle soreness     Social History   Socioeconomic History   Marital status: Married    Spouse name: Not on file   Number of children: 1   Years of education: 12   Highest education level: Not on file  Occupational History   Occupation: Retired  Tobacco Use   Smoking status: Former    Types: Cigars   Smokeless tobacco: Never   Tobacco comments:    Quit 2016  Vaping Use   Vaping status: Never Used  Substance and Sexual Activity   Alcohol use: Yes    Comment: Occasional beer   Drug use: No   Sexual activity: Not Currently  Other Topics Concern   Not on file  Social History Narrative   Lives with wife and daughter lives with them also/2025   Caffeine use: 1-2 cups coffee in the am and 1 glass tea qpm   Social Drivers of Health   Financial Resource Strain: Medium Risk (11/23/2023)   Overall Financial Resource Strain (CARDIA)    Difficulty of Paying Living Expenses: Somewhat hard  Food Insecurity: No Food Insecurity (06/09/2024)   Hunger Vital Sign    Worried About Running Out of Food in the Last Year: Never true    Ran Out of Food in the Last Year: Never true  Transportation Needs: No Transportation Needs (06/09/2024)   PRAPARE - Administrator, Civil Service (Medical): No    Lack of Transportation (Non-Medical): No  Physical Activity: Inactive (11/23/2023)   Exercise Vital Sign    Days of Exercise per Week: 0 days    Minutes of Exercise per Session: 0 min  Stress: No Stress Concern Present (11/23/2023)   [place] [employer] - Occupational Stress Questionnaire    Feeling of Stress : Only a little  Social Connections: Moderately Isolated (03/26/2024)   Social Connection and Isolation Panel    Frequency of Communication with  Friends and Family: Once a week    Frequency of Social Gatherings with Friends and Family: Never    Attends Religious Services: 1 to 4 times per year    Active Member of [employer] or Organizations: No    Attends Banker Meetings: Never    Marital Status: Married  Catering Manager Violence: Not At Risk (06/09/2024)   Humiliation, Afraid, Rape, and Kick questionnaire    Fear of Current or Ex-Partner: No    Emotionally Abused: No    Physically Abused: No    Sexually Abused: No    Physical Exam      Future Appointments  Date Time Provider Department Center  06/12/2024  2:15 PM [facility] LAB [facility] None  06/20/2024 10:00 AM Lanny Callander, MD [facility] None  06/24/2024  2:30 PM [facility] PA/NP [facility] None  06/25/2024  1:30 PM Prentiss Heddy HERO, RN [facility] None  07/04/2024  3:00 PM MC ECHO OP 1 [facility]ECHOLAB [facility]  11/27/2024 10:50 AM [facility]-ANNUAL WELLNESS VISIT [facility]  04/09/2025 12:50 PM [facility]-ECHO 2 H[facility]-ECHO H&V  04/09/2025  2:20 PM Sebastian Lamarr SAUNDERS, PA-C [facility]-MAGST H&V

## 2024-06-10 LAB — SURGICAL PATHOLOGY

## 2024-06-11 ENCOUNTER — Telehealth (HOSPITAL_COMMUNITY): Payer: Self-pay | Admitting: Licensed Clinical Social Worker

## 2024-06-11 ENCOUNTER — Encounter (HOSPITAL_COMMUNITY): Payer: Self-pay

## 2024-06-11 NOTE — Telephone Encounter (Signed)
 H&V Care Navigation CSW Progress Note  Clinical Social Worker consulted to assist with transportation.  Patient has used all insurance rides for the year and does not have access to a car- working on purchasing one at this time.  Ride set up through [place] [employer]- pick up from home 1:45pm- patient and dtr informed  CSW mailing transportation resources and Access [place] application for completion in case they cannot get alternative transportation set up for future appts.  Andriette HILARIO Leech, LCSW Clinical Social Worker [facility] Desk#: [phone] Cell#: [phone]

## 2024-06-12 ENCOUNTER — Ambulatory Visit (HOSPITAL_COMMUNITY): Admission: RE | Admit: 2024-06-12 | Discharge: 2024-06-12 | Attending: Cardiology

## 2024-06-12 DIAGNOSIS — I5020 Unspecified systolic (congestive) heart failure: Secondary | ICD-10-CM

## 2024-06-12 LAB — BASIC METABOLIC PANEL WITH GFR
Anion gap: 10 (ref 5–15)
BUN: 37 mg/dL — ABNORMAL HIGH (ref 8–23)
CO2: 25 mmol/L (ref 22–32)
Calcium: 9.1 mg/dL (ref 8.9–10.3)
Chloride: 104 mmol/L (ref 98–111)
Creatinine, Ser: 1.6 mg/dL — ABNORMAL HIGH (ref 0.61–1.24)
GFR, Estimated: 41 mL/min — ABNORMAL LOW (ref >60)
Glucose, Bld: 104 mg/dL — ABNORMAL HIGH (ref 70–99)
Potassium: 4.6 mmol/L (ref 3.5–5.1)
Sodium: 139 mmol/L (ref 135–145)

## 2024-06-13 ENCOUNTER — Ambulatory Visit (HOSPITAL_COMMUNITY): Payer: Self-pay | Admitting: Family Medicine

## 2024-06-16 ENCOUNTER — Other Ambulatory Visit (HOSPITAL_COMMUNITY): Payer: Self-pay

## 2024-06-16 DIAGNOSIS — Z952 Presence of prosthetic heart valve: Secondary | ICD-10-CM

## 2024-06-19 ENCOUNTER — Other Ambulatory Visit (HOSPITAL_COMMUNITY): Payer: Self-pay

## 2024-06-19 ENCOUNTER — Other Ambulatory Visit: Payer: Self-pay

## 2024-06-19 NOTE — Progress Notes (Signed)
 Paramedicine Encounter    Patient ID: Danny [name], male    DOB: [birth year], 89 y.o.   MRN: [id number]   Complaints- lower leg swelling   Assessment- CAOX4, warm and dry ambulatory, no shortness of breath on short distances, some shortness of breath on long walks, edema still present up to knees, lungs clear. Vitals obtained.   Compliance with meds- missed several carvedilol  doses   Pill box filled- for one week   Refills needed- lasix    Meds changes since last visit- none     Social changes- none    VISIT SUMMARY- Arrived for home visit for [facility] who reports to be feeling well today with no complaints other than lower leg swelling. Weight is down 2 lbs this week. Edema still present up to knees- says he is wearing compression socks and elevating his legs but still swollen. No chest pain, no dizziness, some SHOB on long walks. He has been missing some carvedilol  doses. I re-educated him on med compliance. He understood. He has transportation from family friend to upcoming appointments. He has clinic follow up Tuesday. I am on the truck so we plan to see each other in one week. Home visit complete.   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/2024   Palliative care by specialist 04/04/2024   Aortic stenosis, severe 04/04/2024   Pleural effusion on left 04/01/2024   Coronary artery disease 03/27/2024   Demand ischemia (HCC) 03/26/2024   Elevated troponin 03/26/2024   Acute kidney injury  superimposed on chronic kidney disease 03/26/2024   Transaminitis 03/26/2024   Hyperkalemia 03/26/2024   History of bladder cancer 03/26/2024   Hyperlipidemia 03/26/2024   Generalized weakness 03/26/2024   Bradycardia with 31-40 beats per minute 10/10/2023   Bradycardia 10/10/2023   Scalp abrasion 01/11/2023   Bladder cancer (HCC) 01/11/2023   Coronary artery disease involving native coronary artery of native heart without angina pectoris 05/16/2022   Painless hematuria 01/19/2022   Onychomycosis 08/23/2021   Acute on chronic systolic CHF (congestive heart failure) (HCC) 08/23/2021   PAD (peripheral artery disease) 08/23/2021   Carotid artery disease 08/23/2021   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/2021   Aortic stenosis 07/30/2021   B12 deficiency 07/29/2021   Thrombocytopenia 07/28/2021   Macrocytic anemia 07/28/2021   Seizure-like activity (HCC) 07/27/2021   Generalized abdominal pain 09/30/2020   Loss of weight 09/30/2020   Chronic kidney disease, stage 3a (HCC) 11/21/2019   Transient alteration of awareness 07/25/2018   Syncope 07/25/2018   Hyperlipidemia LDL goal <70 05/28/2015   Medicare annual wellness visit, subsequent 04/20/2015   Erectile dysfunction 04/20/2015   Essential hypertension 04/20/2015   Current Medications[1] Allergies[2]   Social History   Socioeconomic History   Marital status: Married    Spouse name: Not on file   Number of children: 1   Years of education: 12   Highest education level: Not on file  Occupational History  Occupation: Retired  Tobacco Use   Smoking status: Former    Types: Cigars   Smokeless tobacco: Never   Tobacco comments:    Quit 2016  Vaping Use   Vaping status: Never Used  Substance and Sexual Activity   Alcohol use: Yes    Comment: Occasional beer   Drug use: No   Sexual activity: Not Currently  Other Topics Concern   Not on file  Social History Narrative   Lives with wife and daughter lives with them also/2025   Caffeine  use: 1-2 cups coffee in the am and 1 glass tea qpm   Social Drivers of Health   Tobacco Use: Medium Risk (06/03/2024)   Patient History    Smoking Tobacco Use: Former    Smokeless Tobacco Use: Never    Passive Exposure: Not on file  Financial Resource Strain: Medium Risk (11/23/2023)   Overall Financial Resource Strain (CARDIA)    Difficulty of Paying Living Expenses: Somewhat hard  Food Insecurity: No Food Insecurity (06/09/2024)   Epic    Worried About Radiation Protection Practitioner of Food in the Last Year: Never true    Ran Out of Food in the Last Year: Never true  Transportation Needs: No Transportation Needs (06/09/2024)   Epic    Lack of Transportation (Medical): No    Lack of Transportation (Non-Medical): No  Physical Activity: Inactive (11/23/2023)   Exercise Vital Sign    Days of Exercise per Week: 0 days    Minutes of Exercise per Session: 0 min  Stress: No Stress Concern Present (11/23/2023)   [employer] - Occupational Stress Questionnaire    Feeling of Stress : Only a little  Social Connections: Moderately Isolated (03/26/2024)   Social Connection and Isolation Panel    Frequency of Communication with Friends and Family: Once a week    Frequency of Social Gatherings with Friends and Family: Never    Attends Religious Services: 1 to 4 times per year    Active Member of Clubs or Organizations: No    Attends Banker Meetings: Never    Marital Status: Married  Catering Manager Violence: Not At Risk (06/09/2024)   Epic    Fear of Current or Ex-Partner: No    Emotionally Abused: No    Physically Abused: No    Sexually Abused: No  Depression (PHQ2-9): Low Risk (05/02/2024)   Depression (PHQ2-9)    PHQ-2 Score: 0  Alcohol Screen: Low Risk (11/23/2023)   Alcohol Screen    Last Alcohol Screening Score (AUDIT): 0  Housing: Low Risk (06/09/2024)   Epic    Unable to Pay for Housing in the Last Year: No    Number of Times Moved in the Last Year: 0     Homeless in the Last Year: No  Utilities: Not At Risk (06/09/2024)   Epic    Threatened with loss of utilities: No  Health Literacy: Adequate Health Literacy (11/23/2023)   B1300 Health Literacy    Frequency of need for help with medical instructions: Never    Physical Exam      Future Appointments  Date Time Provider Department Center  06/20/2024 10:00 AM Lanny Callander, MD [facility] None  06/24/2024  2:30 PM [facility] PA/NP [facility] None  06/25/2024  1:30 PM Mertel, [place] , RN [facility] None  07/04/2024  3:00 PM MC ECHO OP 1 [facility]ECH[facility]  07/21/2024 11:00 AM [facility]-VASC 10 [facility]-ULTRA H&V  11/27/2024 10:50 AM [facility] G[facility]-ANNUAL WELLNESS VISIT [facility]-GR [employer]  04/09/2025 12:50 PM HVC-ECHO 2 H[facility]-ECHO H&V  04/09/2025  2:20 PM Sebastian Lamarr SAUNDERS, PA-C [facility]-MAGST H&V                 [1]  Current Outpatient Medications:    carvedilol  (COREG ) 3.125 MG tablet, Take 1 tablet (3.125 mg total) by mouth 2 (two) times daily with a meal., Disp: 60 tablet, Rfl: 2   furosemide  (LASIX ) 40 MG tablet, Take 1 tablet (40 mg total) by mouth daily., Disp: 30 tablet, Rfl: 2   nitroGLYCERIN  (NITROSTAT ) 0.4 MG SL tablet, Place 1 tablet (0.4 mg total) under the tongue every 5 (five) minutes as needed for chest pain., Disp: 25 tablet, Rfl: 3   rosuvastatin  (CRESTOR ) 10 MG tablet, Take 1 tablet (10 mg total) by mouth daily., Disp: 30 tablet, Rfl: 2 [2]  Allergies Allergen Reactions   Lipitor [Atorvastatin]     Muscle soreness

## 2024-06-19 NOTE — Assessment & Plan Note (Deleted)
 SABRA

## 2024-06-20 ENCOUNTER — Inpatient Hospital Stay: Attending: Hematology | Admitting: Hematology

## 2024-06-20 DIAGNOSIS — D46Z Other myelodysplastic syndromes: Secondary | ICD-10-CM | POA: Insufficient documentation

## 2024-06-23 ENCOUNTER — Encounter (HOSPITAL_COMMUNITY): Payer: Self-pay

## 2024-06-24 ENCOUNTER — Telehealth (HOSPITAL_COMMUNITY): Payer: Self-pay

## 2024-06-24 ENCOUNTER — Ambulatory Visit (HOSPITAL_COMMUNITY): Payer: Self-pay | Admitting: Family Medicine

## 2024-06-24 ENCOUNTER — Ambulatory Visit (HOSPITAL_COMMUNITY)
Admission: RE | Admit: 2024-06-24 | Discharge: 2024-06-24 | Disposition: A | Source: Ambulatory Visit | Attending: Family Medicine

## 2024-06-24 ENCOUNTER — Encounter (HOSPITAL_COMMUNITY): Payer: Self-pay

## 2024-06-24 VITALS — BP 120/64 | HR 63 | Ht 64.0 in | Wt 137.6 lb

## 2024-06-24 DIAGNOSIS — Z8673 Personal history of transient ischemic attack (TIA), and cerebral infarction without residual deficits: Secondary | ICD-10-CM | POA: Insufficient documentation

## 2024-06-24 DIAGNOSIS — D696 Thrombocytopenia, unspecified: Secondary | ICD-10-CM | POA: Insufficient documentation

## 2024-06-24 DIAGNOSIS — I25810 Atherosclerosis of coronary artery bypass graft(s) without angina pectoris: Secondary | ICD-10-CM

## 2024-06-24 DIAGNOSIS — D631 Anemia in chronic kidney disease: Secondary | ICD-10-CM | POA: Insufficient documentation

## 2024-06-24 DIAGNOSIS — I447 Left bundle-branch block, unspecified: Secondary | ICD-10-CM | POA: Insufficient documentation

## 2024-06-24 DIAGNOSIS — I252 Old myocardial infarction: Secondary | ICD-10-CM | POA: Diagnosis not present

## 2024-06-24 DIAGNOSIS — I255 Ischemic cardiomyopathy: Secondary | ICD-10-CM | POA: Insufficient documentation

## 2024-06-24 DIAGNOSIS — D539 Nutritional anemia, unspecified: Secondary | ICD-10-CM

## 2024-06-24 DIAGNOSIS — I5022 Chronic systolic (congestive) heart failure: Secondary | ICD-10-CM | POA: Diagnosis not present

## 2024-06-24 DIAGNOSIS — Z951 Presence of aortocoronary bypass graft: Secondary | ICD-10-CM | POA: Insufficient documentation

## 2024-06-24 DIAGNOSIS — Z8551 Personal history of malignant neoplasm of bladder: Secondary | ICD-10-CM | POA: Diagnosis not present

## 2024-06-24 DIAGNOSIS — J90 Pleural effusion, not elsewhere classified: Secondary | ICD-10-CM | POA: Insufficient documentation

## 2024-06-24 DIAGNOSIS — N1831 Chronic kidney disease, stage 3a: Secondary | ICD-10-CM

## 2024-06-24 DIAGNOSIS — E875 Hyperkalemia: Secondary | ICD-10-CM | POA: Diagnosis not present

## 2024-06-24 DIAGNOSIS — I739 Peripheral vascular disease, unspecified: Secondary | ICD-10-CM | POA: Diagnosis not present

## 2024-06-24 DIAGNOSIS — Z952 Presence of prosthetic heart valve: Secondary | ICD-10-CM | POA: Diagnosis not present

## 2024-06-24 DIAGNOSIS — I2510 Atherosclerotic heart disease of native coronary artery without angina pectoris: Secondary | ICD-10-CM | POA: Diagnosis not present

## 2024-06-24 LAB — BASIC METABOLIC PANEL WITH GFR
Anion gap: 9 (ref 5–15)
BUN: 36 mg/dL — ABNORMAL HIGH (ref 8–23)
CO2: 26 mmol/L (ref 22–32)
Calcium: 9.2 mg/dL (ref 8.9–10.3)
Chloride: 104 mmol/L (ref 98–111)
Creatinine, Ser: 1.44 mg/dL — ABNORMAL HIGH (ref 0.61–1.24)
GFR, Estimated: 47 mL/min — ABNORMAL LOW (ref >60)
Glucose, Bld: 100 mg/dL — ABNORMAL HIGH (ref 70–99)
Potassium: 5.3 mmol/L — ABNORMAL HIGH (ref 3.5–5.1)
Sodium: 140 mmol/L (ref 135–145)

## 2024-06-24 LAB — PRO BRAIN NATRIURETIC PEPTIDE: Pro Brain Natriuretic Peptide: 7377 pg/mL — ABNORMAL HIGH (ref <300.0)

## 2024-06-24 MED ORDER — POTASSIUM CHLORIDE CRYS ER 20 MEQ PO TBCR: 20.0000 meq | EXTENDED_RELEASE_TABLET | Freq: Every day | ORAL | 3 refills | Status: DC

## 2024-06-24 MED ORDER — FUROSEMIDE 40 MG PO TABS: 40.0000 mg | ORAL_TABLET | Freq: Two times a day (BID) | ORAL | 3 refills | Status: DC

## 2024-06-24 NOTE — Patient Instructions (Addendum)
 Good to see you today!   INCREASE lasix  to 40 mg Twice daily  START potassium 20 meq (1 tablet) daily  Labs done today, your results will be available in MyChart, we will contact you for abnormal readings  Your physician recommends that you schedule a follow-up appointment as scheduled  We have reached out to Dr.Feng's office to get follow up appointment,they will call you to schedule  If you have any questions or concerns before your next appointment please send us  a message through Mascot or call our office at [phone].    TO LEAVE A MESSAGE FOR THE NURSE SELECT OPTION 2, PLEASE LEAVE A MESSAGE INCLUDING: YOUR NAME DATE OF BIRTH CALL BACK NUMBER REASON FOR CALL**this is important as we prioritize the call backs  YOU WILL RECEIVE A CALL BACK THE SAME DAY AS LONG AS YOU CALL BEFORE 4:00 PM At the [facility], you and your health needs are our priority. As part of our continuing mission to provide you with exceptional heart care, we have created designated Provider Care Teams. These Care Teams include your primary Cardiologist (physician) and Advanced Practice Providers (APPs- Physician Assistants and Nurse Practitioners) who all work together to provide you with the care you need, when you need it.   You may see any of the following providers on your designated Care Team at your next follow up: Dr Toribio Fuel Dr Ezra Shuck Dr. Morene Brownie Greig Mosses, NP Caffie Shed, [place] [facility] [place], [place] Beckey Coe, NP Jordan Lee, NP Ellouise Class, NP Tinnie Redman, PharmD Jaun Bash, PharmD   Please be sure to bring in all your medications bottles to every appointment.    Thank you for choosing [facility]

## 2024-06-24 NOTE — Telephone Encounter (Signed)
 Called to confirm/remind patient of their appointment at the [facility] on 06/24/24.   Appointment:   [x] Confirmed  [] Left mess   [] No answer/No voice mail  [] VM Full/unable to leave message  [] Phone not in service  Patient reminded to bring all medications and/or complete list.  Confirmed patient has transportation. Gave directions, instructed to utilize valet parking.

## 2024-06-24 NOTE — Progress Notes (Addendum)
 ADVANCED HF CLINIC NOTE   Primary Care: [name] LABOR, MD Primary Cardiologist: Dr. Rolan  HPI: 89 y.o. male with history of CAD w/ prior NSTEMI 1/23 s/p CABG (RIMA to LAD, LIMA to OM1, SVG to OM2), ischemic cardiomyopathy, aortic valve stenosis, hx CVA, PAD, hx bladder cancer, CKD IIIa, LBBB.   EF previously 40-45% on LV gram at time of cath/NSTEMI in 1/23. EF improved to 55-60% on follow-up echo.   Echo 6/25: EF 50-55%, RWMA w/ basal inferior and basal inferoseptal severe HK, RV okay, moderate AS with mean gradient of 23 mmHg and AVA 1.1 cm2 by VTI.   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 EF found to be <20%, down from prior 50-55% in 12/2023. Structural/AHF team have assisted with care.Underwent transfemoral TAVR 04/04/24. Discharged home, weight 131 lbs.  S/p BMBx 11/25  Today he returns for HF follow up. Overall feeling fine. No SOB with ADLs, walks with a cane. Legs are swelling, wearing compression hose. Denies palpitations, abnormal bleeding, CP, dizziness, or PND/Orthopnea. Appetite ok. Weight at home 139 pounds. Taking all medications. Lives with wife, daughter helps out. Followed by paramedicine, requires transportation assistance.  ECG (personally reviewed): none ordered today  Labs (11/25): K 5.9, creatinine 1.57 Labs (12/25): K 4.6, creatinine 1.60  Cardiac Studies - Echo 9/25: EF < 20%, G2DD,  RV moderately reduced, LFLG AS with mean gradient  17 mmHg and AVA 0.77 cm by VTI  - Echo 6/25: EF 50-55%, RWMA w/ basal inferior and basal inferoseptal severe HK, RV okay, moderate AS with mean gradient of 23 mmHg and AVA 1.1 cm2 by VTI.  Past Medical History:  Diagnosis Date   (HFpEF) heart failure with preserved ejection fraction (HCC)    Echo 1/23: Inferior AK, mild aortic stenosis (mean 9 mmHg, V-max 210.5 cm/s, DI 0.70), EF 55-60, GR 1 DD, normal RVSF, normal PASP, trivial MR, mild  AI, borderline dilation of aortic root (39 mm)   Acid reflux    hiatal hernia   Anemia    Bladder cancer (HCC)    CAD (coronary artery disease)    S/p non-STEMI 1/23 >> s/p CABG   Carotid artery disease 08/23/2021   Pre-CABG Dopplers 1/23:R ICA 40-59; L ICA 1-39 // Carotid US  07/20/2023: RICA 40-59; LICA 1-39   CHF (congestive heart failure) (HCC)    Chronic kidney disease (CKD)    Coronary artery disease involving native coronary artery of native heart without angina pectoris 05/16/2022   Inf NSTEMI s/p CABG in 07/2021 (RIMA-LAD, LIMA-OM1, S-OM2)   Diverticulitis    Erectile dysfunction 04/20/2015   Essential hypertension 04/20/2015   History of colon polyps    History of kidney stones    History of stroke    Noted on brain MRI 07/2021   Hyperlipidemia 05/28/2015   PAD (peripheral artery disease)    Pre-CABG Dopplers 1/23: Right ABI 0.65; left ABI 0.82   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 Wendel and Dr. Aneita   Skin cancer    Stroke [facility])    noted MRI 2023 pt. unaware    Current Outpatient Medications  Medication Sig Dispense Refill   carvedilol  (COREG ) 3.125 MG tablet Take 1 tablet (3.125 mg total) by mouth 2 (two) times daily with a meal. 60 tablet 2   furosemide  (LASIX ) 40 MG tablet Take 1 tablet (40  mg total) by mouth daily. 30 tablet 2   nitroGLYCERIN  (NITROSTAT ) 0.4 MG SL tablet Place 1 tablet (0.4 mg total) under the tongue every 5 (five) minutes as needed for chest pain. 25 tablet 3   rosuvastatin  (CRESTOR ) 10 MG tablet Take 1 tablet (10 mg total) by mouth daily. 30 tablet 2   No current facility-administered medications for this encounter.    Allergies  Allergen Reactions   Lipitor [Atorvastatin]     Muscle soreness      Social History   Socioeconomic History   Marital status: Married    Spouse name: Not on file   Number of children: 1   Years of education: 12    Highest education level: Not on file  Occupational History   Occupation: Retired  Tobacco Use   Smoking status: Former    Types: Cigars   Smokeless tobacco: Never   Tobacco comments:    Quit 2016  Vaping Use   Vaping status: Never Used  Substance and Sexual Activity   Alcohol use: Yes    Comment: Occasional beer   Drug use: No   Sexual activity: Not Currently  Other Topics Concern   Not on file  Social History Narrative   Lives with wife and daughter lives with them also/2025   Caffeine use: 1-2 cups coffee in the am and 1 glass tea qpm   Social Drivers of Health   Tobacco Use: Medium Risk (06/24/2024)   Patient History    Smoking Tobacco Use: Former    Smokeless Tobacco Use: Never    Passive Exposure: Not on file  Financial Resource Strain: Medium Risk (11/23/2023)   Overall Financial Resource Strain (CARDIA)    Difficulty of Paying Living Expenses: Somewhat hard  Food Insecurity: No Food Insecurity (06/09/2024)   Epic    Worried About Radiation Protection Practitioner of Food in the Last Year: Never true    Ran Out of Food in the Last Year: Never true  Transportation Needs: No Transportation Needs (06/09/2024)   Epic    Lack of Transportation (Medical): No    Lack of Transportation (Non-Medical): No  Physical Activity: Inactive (11/23/2023)   Exercise Vital Sign    Days of Exercise per Week: 0 days    Minutes of Exercise per Session: 0 min  Stress: No Stress Concern Present (11/23/2023)   [employer] - Occupational Stress Questionnaire    Feeling of Stress : Only a little  Social Connections: Moderately Isolated (03/26/2024)   Social Connection and Isolation Panel    Frequency of Communication with Friends and Family: Once a week    Frequency of Social Gatherings with Friends and Family: Never    Attends Religious Services: 1 to 4 times per year    Active Member of Clubs or Organizations: No    Attends Banker Meetings: Never    Marital Status:  Married  Catering Manager Violence: Not At Risk (06/09/2024)   Epic    Fear of Current or Ex-Partner: No    Emotionally Abused: No    Physically Abused: No    Sexually Abused: No  Depression (PHQ2-9): Low Risk (05/02/2024)   Depression (PHQ2-9)    PHQ-2 Score: 0  Alcohol Screen: Low Risk (11/23/2023)   Alcohol Screen    Last Alcohol Screening Score (AUDIT): 0  Housing: Low Risk (06/09/2024)   Epic    Unable to Pay for Housing in the Last Year: No    Number of Times Moved in the  Last Year: 0    Homeless in the Last Year: No  Utilities: Not At Risk (06/09/2024)   Epic    Threatened with loss of utilities: No  Health Literacy: Adequate Health Literacy (11/23/2023)   B1300 Health Literacy    Frequency of need for help with medical instructions: Never   Family History  Problem Relation Age of Onset   Stroke Father 24   Sudden death Mother 32       unknown cause   Wt Readings from Last 3 Encounters:  06/24/24 62.4 kg (137 lb 9.6 oz)  06/19/24 60.3 kg (133 lb)  06/09/24 61.2 kg (135 lb)   BP 120/64   Pulse 63   Ht 5' 4 (1.626 m)   Wt 62.4 kg (137 lb 9.6 oz)   SpO2 99%   BMI 23.62 kg/m   PHYSICAL EXAM: General:  NAD. No resp difficulty, walked into clinic with cane, elderly HEENT: +HOH Neck: Supple. JVP to jaw Cor: Regular rate & rhythm. No rubs, gallops or murmurs. Lungs: Clear Abdomen: Soft, nontender, nondistended.  Extremities: No cyanosis, clubbing, rash, 2-3+ BLE edema Neuro: Alert & oriented x 3, moves all 4 extremities w/o difficulty. Affect pleasant.  ASSESSMENT & PLAN: 1. Chronic systolic CHF with cardiogenic shock, pleural effusions requiring thoracentesis: Echo 6/25 EF 50-55% w/ reported moderate AS. Admitted 9/25 with CGS, with LFLG AS. Echo 9/25 showed EF < 20%, severe AS. Underwent TAVR 04/05/24, POD1 echo showed EF up to 35-40% bioprosthetic aortic valve functioning normally. Low EF at admission may be due to combination of severe AS and LBBB.  Today, he is  NYHA II, confounded by deconditioning. He is volume overloaded today, weight is up.  - Increase Lasix  to 40 mg bid, add 20 KCL daily. BMET/BNP today - Continue Coreg  3.125 mg bid - Add ARB/ARNi if next. - Off spiro with hyperkalemia and need for Lokelma , could re-challenge with paramedicine on-board and trial with scheduled Lokelma . - Off SGLT2i for now with confirmed UTI - Place compression hose 2. Severe AS: s/p TAVR 04/04/24. Echo 04/05/24 with EF up to 35-40% and normal functioning bioprosthetic aortic valve functioning - no antiplatelets in setting of anemia/thrombocytopenia - Repeat echo has been arranged. 3. CAD: NSTEMI 2023 s/p CABG X 3. Per notes cardiac CT demonstrated patent RIMA-LAD and LIMA-OM1 as well as an occluded SVG-->OM which was not felt to explain his cardiomyopathy, angiography deferred. No chest pain - Continue Crestor  10 mg daily. - No aspirin  with significant anemia and thrombocytopenia.  4. Chronic macrocytic anemia: denies over bleeding. Hgb averaging in 7s - Follows with Dr. Lanny. 5. CKD IIIa: Baseline Scr 1.2-1.3. Most recent up to 1.6 - BMET today 6. Question of Afib during last admission: EKGs has shown NSR/SB with LBBB, will need to follow conduction. Regular on exam today. - Not a great candidate for [facility] given anemia/thrombocytopenia.  7. Thrombocytopenia: Platelets have continued to trend down, 106K on admit >>30s>>>>now 78 - Not on any form of heparin  at this time.  - Heme/Onc felt  thromboctyopenia likely 2/2 severe AS and recent TAVR.  - s/p BMBx,  results showed 5% blasts, ? Myeloid neoplasm. Needs follow up with Dr. Lanny to review results.   Follow up in 1 week with APP for fluid check (will need BMET)  Harlene Gainer, FNP-BC 06/24/2024

## 2024-06-25 ENCOUNTER — Other Ambulatory Visit (HOSPITAL_COMMUNITY): Payer: Self-pay

## 2024-06-25 ENCOUNTER — Other Ambulatory Visit: Payer: Self-pay

## 2024-06-25 ENCOUNTER — Encounter (HOSPITAL_COMMUNITY): Payer: Self-pay

## 2024-06-25 MED ORDER — FUROSEMIDE 40 MG PO TABS
40.0000 mg | ORAL_TABLET | Freq: Two times a day (BID) | ORAL | 3 refills | Status: AC
  Filled 2024-06-25 (×3): qty 90, 45d supply, fill #0
  Filled 2024-08-08: qty 90, 45d supply, fill #1

## 2024-06-25 NOTE — Patient Instructions (Signed)
 Danny [name] - I am sorry I was unable to reach you today for our scheduled appointment. I work with [name] LABOR, MD and am calling to support your healthcare needs. Please contact me at [phone] at your earliest convenience. I look forward to speaking with you soon.   Thank you,  [name]  Administrator, [employer] [phone]

## 2024-06-25 NOTE — Progress Notes (Signed)
 Paramedicine Encounter    Patient ID: Danny [name], male    DOB: 06-09-1934, 89 y.o.   MRN: [id number]   Complaints- no complaints   Assessment- CAOx4, warm and dry seated in his living room, no shortness of breath upon ambulating, no fatigue, no chest pain.   Compliance with meds- no missed doses   Pill box filled- for one week   Refills needed- none   Meds changes since last visit- lasix  increased to 40mg  BID     Social changes- needs transportation- no family vehicle at present    VISIT SUMMARY- Arrived for home visit for [name] who reports to be feeling well with no complaints today- met me at the door and ambulated to the kitchen with no shortness of breath. No fatigue. He denied any symptoms. He has bilateral edema to his knees on both legs. His weight is up 4 lbs from last home visit. I noted Banquet frozen dinners and bacon in the kitchen. He also admits to drinking over 68 ounces of fluid daily- I educated him extensively in the home today on the sodium limits and fluid restrictions he is to adhere to daily to maintain a safe and healthy fluid amount to keep him healthy and at home. I educated him on medication changes and updated him on labs and med updates. I went to pick up his Lasix  after getting it filled at the correct pharmacy- came back out and filled his pill box for one week. I plan to meet him in the clinic in one week. He will need transportation for this visit. I will message Jenna for same. He is agreeable. He knows to reach out for any needs. Home visit complete. I will follow up in clinic on Tuesday.   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/04/2024   Palliative care by specialist 04/04/2024   Aortic stenosis, severe 04/04/2024   Pleural effusion on left 04/01/2024   Coronary artery disease 03/27/2024   Demand ischemia (HCC) 03/26/2024   Elevated troponin 03/26/2024   Acute kidney injury superimposed on chronic kidney disease 03/26/2024   Transaminitis 03/26/2024   Hyperkalemia 03/26/2024   History of bladder cancer 03/26/2024   Hyperlipidemia 03/26/2024   Generalized weakness 03/26/2024   Bradycardia with 31-40 beats per minute 10/10/2023   Bradycardia 10/10/2023   Scalp abrasion 01/11/2023   Bladder cancer (HCC) 01/11/2023   Coronary artery disease involving native coronary artery of native heart without angina pectoris 05/16/2022   Painless hematuria 01/19/2022   Onychomycosis 08/23/2021   Acute on chronic systolic CHF (congestive heart failure) (HCC) 08/23/2021   PAD (peripheral artery disease) 08/23/2021   Carotid artery disease 08/23/2021   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/2021   Aortic stenosis 07/30/2021   B12 deficiency 07/29/2021   Thrombocytopenia 07/28/2021   Macrocytic anemia 07/28/2021   Seizure-like activity (HCC) 07/27/2021   Generalized abdominal pain 09/30/2020   Loss of weight 09/30/2020   Chronic kidney disease, stage 3a (HCC) 11/21/2019  Transient alteration of awareness 07/25/2018   Syncope 07/25/2018   Hyperlipidemia LDL goal <70 05/28/2015   Medicare annual wellness visit, subsequent 04/20/2015   Erectile dysfunction 04/20/2015   Essential hypertension 04/20/2015   Current Medications[1] Allergies[1]   Social History   Socioeconomic History   Marital status: Married    Spouse name: Not on file   Number of children: 1   Years of education: 12   Highest education level: Not on file  Occupational History    Occupation: Retired  Tobacco Use   Smoking status: Former    Types: Cigars   Smokeless tobacco: Never   Tobacco comments:    Quit 2016  Vaping Use   Vaping status: Never Used  Substance and Sexual Activity   Alcohol use: Yes    Comment: Occasional beer   Drug use: No   Sexual activity: Not Currently  Other Topics Concern   Not on file  Social History Narrative   Lives with wife and daughter lives with them also/2025   Caffeine use: 1-2 cups coffee in the am and 1 glass tea qpm   Social Drivers of Health   Tobacco Use: Medium Risk (06/24/2024)   Patient History    Smoking Tobacco Use: Former    Smokeless Tobacco Use: Never    Passive Exposure: Not on file  Financial Resource Strain: Medium Risk (11/23/2023)   Overall Financial Resource Strain (CARDIA)    Difficulty of Paying Living Expenses: Somewhat hard  Food Insecurity: No Food Insecurity (06/09/2024)   Epic    Worried About Radiation Protection Practitioner of Food in the Last Year: Never true    Ran Out of Food in the Last Year: Never true  Transportation Needs: No Transportation Needs (06/09/2024)   Epic    Lack of Transportation (Medical): No    Lack of Transportation (Non-Medical): No  Physical Activity: Inactive (11/23/2023)   Exercise Vital Sign    Days of Exercise per Week: 0 days    Minutes of Exercise per Session: 0 min  Stress: No Stress Concern Present (11/23/2023)   Arleigh-davidson of Occupational Health - Occupational Stress Questionnaire    Feeling of Stress : Only a little  Social Connections: Moderately Isolated (03/26/2024)   Social Connection and Isolation Panel    Frequency of Communication with Friends and Family: Once a week    Frequency of Social Gatherings with Friends and Family: Never    Attends Religious Services: 1 to 4 times per year    Active Member of [employer] or Organizations: No    Attends Banker Meetings: Never    Marital Status: Married  Catering Manager Violence: Not At Risk (06/09/2024)    Epic    Fear of Current or Ex-Partner: No    Emotionally Abused: No    Physically Abused: No    Sexually Abused: No  Depression (PHQ2-9): Low Risk (05/02/2024)   Depression (PHQ2-9)    PHQ-2 Score: 0  Alcohol Screen: Low Risk (11/23/2023)   Alcohol Screen    Last Alcohol Screening Score (AUDIT): 0  Housing: Low Risk (06/09/2024)   Epic    Unable to Pay for Housing in the Last Year: No    Number of Times Moved in the Last Year: 0    Homeless in the Last Year: No  Utilities: Not At Risk (06/09/2024)   Epic    Threatened with loss of utilities: No  Health Literacy: Adequate Health Literacy (11/23/2023)   B1300 Health Literacy    Frequency  of need for help with medical instructions: Never    Physical Exam      Future Appointments  Date Time Provider Department Center  07/01/2024  1:30 PM [facility] PA/NP [facility] None  07/04/2024  3:00 PM [facility] ECHO OP 1 [facility]ECHOL[facility]  07/21/2024 11:00 AM HVC-VASC 10 H[facility]-ULTRA H&V  07/23/2024  1:00 PM Mertel, [place] , RN [facility]-POPH None  11/27/2024 10:50 AM [facility]-ANNUAL WELLNESS VISIT [facility]  04/09/2025 12:50 PM HVC-ECHO 2 HVC-ECHO H&V  04/09/2025  2:20 PM Sebastian Lamarr SAUNDERS, PA-C CVD-MAGST H&V                 [1]  Current Outpatient Medications:    carvedilol  (COREG ) 3.125 MG tablet, Take 1 tablet (3.125 mg total) by mouth 2 (two) times daily with a meal., Disp: 60 tablet, Rfl: 2   furosemide  (LASIX ) 40 MG tablet, Take 1 tablet (40 mg total) by mouth 2 (two) times daily., Disp: 90 tablet, Rfl: 3   nitroGLYCERIN  (NITROSTAT ) 0.4 MG SL tablet, Place 1 tablet (0.4 mg total) under the tongue every 5 (five) minutes as needed for chest pain., Disp: 25 tablet, Rfl: 3   rosuvastatin  (CRESTOR ) 10 MG tablet, Take 1 tablet (10 mg total) by mouth daily., Disp: 30 tablet, Rfl: 2   potassium chloride  SA (KLOR-CON  M) 20 MEQ tablet, Take 1 tablet (20 mEq total) by mouth daily. (Patient not taking: Reported on 06/25/2024), Disp: 90 tablet,  Rfl: 3 [1]  Allergies Allergen Reactions   Lipitor [Atorvastatin]     Muscle soreness

## 2024-06-26 ENCOUNTER — Telehealth: Payer: Self-pay | Admitting: Hematology

## 2024-06-26 ENCOUNTER — Telehealth (HOSPITAL_COMMUNITY): Payer: Self-pay | Admitting: Licensed Clinical Social Worker

## 2024-06-26 NOTE — Telephone Encounter (Signed)
 H&V Care Navigation CSW Progress Note  Clinical Social Worker consulted to assist with transportation.  Set up ride through [employer].  SDOH Screenings   Food Insecurity: No Food Insecurity (06/09/2024)  Housing: Low Risk (06/09/2024)  Transportation Needs: Unmet Transportation Needs (06/26/2024)  Utilities: Not At Risk (06/09/2024)  Alcohol Screen: Low Risk (11/23/2023)  Depression (PHQ2-9): Low Risk (05/02/2024)  Financial Resource Strain: Medium Risk (11/23/2023)  Physical Activity: Inactive (11/23/2023)  Social Connections: Moderately Isolated (03/26/2024)  Stress: No Stress Concern Present (11/23/2023)  Tobacco Use: Medium Risk (06/24/2024)  Health Literacy: Adequate Health Literacy (11/23/2023)    06/26/2024  Danny [name] DOB: 08/17/1933 MRN: [id number]   RIDER WAIVER AND RELEASE OF LIABILITY  For the purposes of helping with transportation needs, [facility] partners with outside transportation providers (taxi companies, [place], catering manager.) to give [facility] patients or other approved people the choice of on-demand rides Public Librarian) to our buildings for non-emergency visits.  By using [employer], I, the person signing this document, on behalf of myself and/or any legal minors (in my care using the [employer]), agree:  Science Writer given to me are supplied by independent, outside transportation providers who do not work for, or have any affiliation with, [employer]. [place] is not a transportation company. [place] has no control over the quality or safety of the rides I get using [employer]. [facility] has no control over whether any outside ride will happen on time or not. [place] gives no guarantee on the reliability, quality, safety, or availability on any rides, or that no mistakes will happen. I know and accept that traveling by vehicle (car, truck, SVU, fleeta, bus, taxi, etc.) has risks of serious injuries such as disability,  being paralyzed, and death. I know and agree the risk of using [employer] is mine alone, and not [employer]. [employer] are provided as is and as are available. The transportation providers are in charge for all inspections and care of the vehicles used to provide these rides. I agree not to take legal action against [place], its agents, employees, officers, directors, representatives, insurers, attorneys, assigns, successors, subsidiaries, and affiliates at any time for any reasons related directly or indirectly to using [employer]. I also agree not to take legal action against [place] or its affiliates for any injury, death, or damage to property caused by or related to using [employer]. I have read this Waiver and Release of Liability, and I understand the terms used in it and their legal meaning. This Waiver is freely and voluntarily given with the understanding that my right (or any legal minors) to legal action against [place] relating to [employer] is knowingly given up to use these services.   I attest that I read the Ride Waiver and Release of Liability to [employer], gave [name] the opportunity to ask questions and answered the questions asked (if any). I affirm that [name] then provided consent for assistance with transportation.     [name] [name]

## 2024-06-29 NOTE — Assessment & Plan Note (Addendum)
 MDS, high risk, IPSS-R 5.0 -pt presented with anemia with Hg in 7's and thrombocytopenia with platelet in 30-70 K range since early 2025, previous had mild anemia for several years.  Required blood transfusion -BM biopsy on 06/03/2024 showed normocellular bone marrow (15%) with trilineage hematopoiesis.  No significant dysplasia is identified in any of the lineages.  Blasts appear minimally increased (approximately 5%).  - Cytogenetics was normal.  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 -I recommend Aazcitadine and also discussed option of ESA.  Given his advanced age, will let him try ESA first.

## 2024-06-30 ENCOUNTER — Telehealth: Payer: Self-pay

## 2024-06-30 ENCOUNTER — Inpatient Hospital Stay

## 2024-06-30 ENCOUNTER — Encounter: Payer: Self-pay | Admitting: Hematology

## 2024-06-30 ENCOUNTER — Other Ambulatory Visit (HOSPITAL_COMMUNITY): Payer: Self-pay | Admitting: *Deleted

## 2024-06-30 ENCOUNTER — Inpatient Hospital Stay: Admitting: Hematology

## 2024-06-30 VITALS — BP 127/74 | HR 66 | Temp 97.8°F | Resp 16 | Wt 132.9 lb

## 2024-06-30 DIAGNOSIS — D469 Myelodysplastic syndrome, unspecified: Secondary | ICD-10-CM

## 2024-06-30 DIAGNOSIS — D649 Anemia, unspecified: Secondary | ICD-10-CM

## 2024-06-30 DIAGNOSIS — C679 Malignant neoplasm of bladder, unspecified: Secondary | ICD-10-CM

## 2024-06-30 DIAGNOSIS — D46Z Other myelodysplastic syndromes: Secondary | ICD-10-CM | POA: Diagnosis present

## 2024-06-30 LAB — CBC WITH DIFFERENTIAL/PLATELET
Abs Immature Granulocytes: 0.02 K/uL (ref 0.00–0.07)
Basophils Absolute: 0 K/uL (ref 0.0–0.1)
Basophils Relative: 0 %
Eosinophils Absolute: 0 K/uL (ref 0.0–0.5)
Eosinophils Relative: 1 %
HCT: 22.3 % — ABNORMAL LOW (ref 39.0–52.0)
Hemoglobin: 7.3 g/dL — ABNORMAL LOW (ref 13.0–17.0)
Immature Granulocytes: 1 %
Lymphocytes Relative: 34 %
Lymphs Abs: 1.5 K/uL (ref 0.7–4.0)
MCH: 36.3 pg — ABNORMAL HIGH (ref 26.0–34.0)
MCHC: 32.7 g/dL (ref 30.0–36.0)
MCV: 110.9 fL — ABNORMAL HIGH (ref 80.0–100.0)
Monocytes Absolute: 0.2 K/uL (ref 0.1–1.0)
Monocytes Relative: 4 %
Neutro Abs: 2.7 K/uL (ref 1.7–7.7)
Neutrophils Relative %: 60 %
Platelets: 98 K/uL — ABNORMAL LOW (ref 150–400)
RBC: 2.01 MIL/uL — ABNORMAL LOW (ref 4.22–5.81)
RDW: 17.3 % — ABNORMAL HIGH (ref 11.5–15.5)
WBC: 4.4 K/uL (ref 4.0–10.5)
nRBC: 0 % (ref 0.0–0.2)

## 2024-06-30 LAB — FERRITIN: Ferritin: 342 ng/mL — ABNORMAL HIGH (ref 24–336)

## 2024-06-30 LAB — SAMPLE TO BLOOD BANK

## 2024-06-30 LAB — SURGICAL PATHOLOGY

## 2024-06-30 NOTE — Telephone Encounter (Signed)
 Contacted [facility] per Dr. Lanny @T [phone], requested patient's recent office visit note. Spoke with someone at [employer] [phone]. Awaiting 10/2023 office visit note.

## 2024-06-30 NOTE — Progress Notes (Signed)
 " [facility]   Telephone:([phone] Fax:([phone]   Clinic Follow up Note   Patient Care Team: [name] LABOR, MD as PCP - General (Internal Medicine) Wendel Lurena POUR, MD as PCP - Structural Heart (Cardiology) Lanny Callander, MD as Consulting Physician (Hematology) Prentiss Heddy HERO, RN as [facility] Care Management  Date of Service:  06/30/2024  CHIEF COMPLAINT: f/u of anemia and thrombocytopenia  CURRENT THERAPY:  Pending ESA   Oncology History   MDS (myelodysplastic syndrome) (HCC) MDS, high risk, IPSS-R 5.0 -pt presented with anemia with Hg in 7's and thrombocytopenia with platelet in 30-70 K range since early 2025, previous had mild anemia for several years.  Required blood transfusion -BM biopsy on 06/03/2024 showed normocellular bone marrow (15%) with trilineage hematopoiesis.  No significant dysplasia is identified in any of the lineages.  Blasts appear minimally increased (approximately 5%).  - Cytogenetics was normal.  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 -I recommend Aazcitadine   Assessment & Plan Myelodysplastic syndrome Chronic myelodysplastic syndrome confirmed by bone marrow biopsy with two mutations and increased premature cells. Anemia is the primary clinical issue, with mild thrombocytopenia. He remains asymptomatic with no bleeding or infectious complications, preserved functional status, and no current fatigue or dyspnea. There is ongoing risk for progression to acute leukemia, but no evidence of transformation. Current management focuses on anemia and surveillance for disease progression. Erythropoiesis-stimulating agent therapy is indicated given his age and clinical status; chemotherapy will be reserved for future disease progression. The proposed erythropoiesis-stimulating agent carries a small risk of thrombosis, particularly if hemoglobin exceeds 11 g/dL; this will be mitigated by withholding therapy above this  threshold. - Ordered repeat complete blood count to assess hematologic status. - Planned initiation of erythropoiesis-stimulating agent injection every two weeks, starting next week. - Instructed him to obtain blood tests prior to each injection visit. - Advised him to keep blue band on after blood draw until notified regarding transfusion need. - Scheduled follow-up in 1.5 months to reassess anemia and response to therapy. - Discussed that chemotherapy may be considered if cytopenias worsen or there is evidence of disease progression.  CAD with history of CABG, ischemic cardiomyopathy, CVA, PAD, CKD stage III -Follow-up with cardiology and PCP  Plan - I reviewed his bone marrow biopsy results, cytogenetics and NGS results.  I discussed the diagnosis of MDS and his overall prognosis. - I recommend him to start ESA first, if he does not respond, we will switch to azacitidine - Plan to start Retacrit next week, and continue every 2 weeks.  Follow-up with steroid injection.   Discussed the use of AI scribe software for clinical note transcription with the patient, who gave verbal consent to proceed.  History of Present Illness Danny [name] [place] is an 89 year old male with myelodysplastic syndrome and associated anemia who presents for hematology/oncology follow-up to address ongoing anemia and review recent diagnostic findings.  He has myelodysplastic syndrome with chronic anemia, with prior bone marrow biopsy showing two mutations and premature cells.  He denies pain, dyspnea, epistaxis, gingival bleeding, fatigue, or activity limitation. He has a persistent ecchymosis on his hand that occasionally bleeds without progression. He is not on anticoagulants or aspirin  but takes clopidogrel . His appetite is good.  He has had two bladder surgeries for cysts without known bladder cancer and continues urology follow-up. He has a black skin lesion described as a skin tag and plans dermatology  evaluation.     All other systems  were reviewed with the patient and are negative.  MEDICAL HISTORY:  Past Medical History:  Diagnosis Date   (HFpEF) heart failure with preserved ejection fraction (HCC)    Echo 1/23: Inferior AK, mild aortic stenosis (mean 9 mmHg, V-max 210.5 cm/s, DI 0.70), EF 55-60, GR 1 DD, normal RVSF, normal PASP, trivial MR, mild AI, borderline dilation of aortic root (39 mm)   Acid reflux    hiatal hernia   Anemia    Bladder cancer (HCC)    CAD (coronary artery disease)    S/p non-STEMI 1/23 >> s/p CABG   Carotid artery disease 08/23/2021   Pre-CABG Dopplers 1/23:R ICA 40-59; L ICA 1-39 // Carotid US  07/20/2023: RICA 40-59; LICA 1-39   CHF (congestive heart failure) (HCC)    Chronic kidney disease (CKD)    Coronary artery disease involving native coronary artery of native heart without angina pectoris 05/16/2022   Inf NSTEMI s/p CABG in 07/2021 (RIMA-LAD, LIMA-OM1, S-OM2)   Diverticulitis    Erectile dysfunction 04/20/2015   Essential hypertension 04/20/2015   History of colon polyps    History of kidney stones    History of stroke    Noted on brain MRI 07/2021   Hyperlipidemia 05/28/2015   PAD (peripheral artery disease)    Pre-CABG Dopplers 1/23: Right ABI 0.65; left ABI 0.82   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 Wendel and Dr. Aneita   Skin cancer    Stroke [facility])    noted MRI 2023 pt. unaware    SURGICAL HISTORY: Past Surgical History:  Procedure Laterality Date   APPENDECTOMY     cataract surgery     CORONARY ARTERY BYPASS GRAFT N/A 08/01/2021   Procedure: CORONARY ARTERY BYPASS GRAFTING (CABG) X 3  ,ON PUMP, USING LEFT AND RIGHT INTERNAL MAMMARY ARTERIES, RIGHT AND LEFT ENDOSCOPIC GREATER SAPHENOUS VEIN CONDUITS;  Surgeon: Lucas Dorise POUR, MD;  Location: [facility] OR;  Service: Open Heart Surgery;  Laterality: N/A;   ENDOVEIN HARVEST OF GREATER SAPHENOUS VEIN  Right 08/01/2021   Procedure: ENDOVEIN HARVEST OF GREATER SAPHENOUS VEIN;  Surgeon: Lucas Dorise POUR, MD;  Location: [facility] OR;  Service: Open Heart Surgery;  Laterality: Right;   INTRAOPERATIVE TRANSTHORACIC ECHOCARDIOGRAM N/A 04/04/2024   Procedure: ECHOCARDIOGRAM, TRANSTHORACIC;  Surgeon: Wendel Lurena POUR, MD;  Location: [facility] INVASIVE CV LAB;  Service: Cardiovascular;  Laterality: N/A;   LEFT HEART CATH AND CORONARY ANGIOGRAPHY N/A 07/27/2021   Procedure: LEFT HEART CATH AND CORONARY ANGIOGRAPHY;  Surgeon: Darron Deatrice LABOR, MD;  Location: [facility] INVASIVE CV LAB;  Service: Cardiovascular;  Laterality: N/A;   SKIN CANCER EXCISION     TEE WITHOUT CARDIOVERSION N/A 08/01/2021   Procedure: TRANSESOPHAGEAL ECHOCARDIOGRAM (TEE);  Surgeon: Lucas Dorise POUR, MD;  Location: [facility] OR;  Service: Open Heart Surgery;  Laterality: N/A;   TRANSURETHRAL RESECTION OF BLADDER TUMOR N/A 01/03/2023   Procedure: TRANSURETHRAL RESECTION OF BLADDER TUMOR (TURBT);  Surgeon: Devere Lonni Righter, MD;  Location: [facility] ORS;  Service: Urology;  Laterality: N/A;  90 MINUTES   TRANSURETHRAL RESECTION OF BLADDER TUMOR N/A 10/10/2023   Procedure: TURBT (TRANSURETHRAL RESECTION OF BLADDER TUMOR);  Surgeon: Devere Lonni Righter, MD;  Location: [facility] ORS;  Service: Urology;  Laterality: N/A;   TRANSURETHRAL RESECTION OF PROSTATE      I have reviewed the social history and family history with the patient and they are unchanged from previous note.  ALLERGIES:  is  allergic to lipitor [atorvastatin].  MEDICATIONS:  Current Outpatient Medications  Medication Sig Dispense Refill   carvedilol  (COREG ) 3.125 MG tablet Take 1 tablet (3.125 mg total) by mouth 2 (two) times daily with a meal. 60 tablet 2   furosemide  (LASIX ) 40 MG tablet Take 1 tablet (40 mg total) by mouth 2 (two) times daily. 90 tablet 3   nitroGLYCERIN  (NITROSTAT ) 0.4 MG SL tablet Place 1 tablet (0.4 mg total) under the tongue every 5 (five) minutes as needed for chest pain. 25  tablet 3   potassium chloride  SA (KLOR-CON  M) 20 MEQ tablet Take 1 tablet (20 mEq total) by mouth daily. 90 tablet 3   rosuvastatin  (CRESTOR ) 10 MG tablet Take 1 tablet (10 mg total) by mouth daily. 30 tablet 2   No current facility-administered medications for this visit.    PHYSICAL EXAMINATION: ECOG PERFORMANCE STATUS: 1 - Symptomatic but completely ambulatory  Vitals:   06/30/24 0803  BP: 127/74  Pulse: 66  Resp: 16  Temp: 97.8 F (36.6 C)  SpO2: 100%   Wt Readings from Last 3 Encounters:  06/30/24 132 lb 14.4 oz (60.3 kg)  06/25/24 136 lb (61.7 kg)  06/24/24 137 lb 9.6 oz (62.4 kg)     GENERAL:alert, no distress and comfortable SKIN: skin color, texture, turgor are normal, no rashes or significant lesions EYES: normal, Conjunctiva are pink and non-injected, sclera clear NECK: supple, thyroid  normal size, non-tender, without nodularity LYMPH:  no palpable lymphadenopathy in the cervical, axillary  LUNGS: clear to auscultation and percussion with normal breathing effort HEART: regular rate & rhythm and no murmurs and no lower extremity edema ABDOMEN:abdomen soft, non-tender and normal bowel sounds Musculoskeletal:no cyanosis of digits and no clubbing  NEURO: alert & oriented x 3 with fluent speech, no focal motor/sensory deficits  Physical Exam    LABORATORY DATA:  I have reviewed the data as listed    Latest Ref Rng & Units 06/30/2024    8:48 AM 06/03/2024    8:53 AM 05/15/2024    4:12 PM  CBC  WBC 4.0 - 10.5 K/uL 4.4  3.7  4.5   Hemoglobin 13.0 - 17.0 g/dL 7.3  7.2  7.5   Hematocrit 39.0 - 52.0 % 22.3  22.6  22.0   Platelets 150 - 400 K/uL 98  78  70         Latest Ref Rng & Units 06/24/2024    3:13 PM 06/12/2024    3:13 PM 06/02/2024    4:28 PM  CMP  Glucose 70 - 99 mg/dL 899  895  898   BUN 8 - 23 mg/dL 36  37  31   Creatinine 0.61 - 1.24 mg/dL 8.55  8.39  8.61   Sodium 135 - 145 mmol/L 140  139  137   Potassium 3.5 - 5.1 mmol/L 5.3  4.6  5.6    Chloride 98 - 111 mmol/L 104  104  106   CO2 22 - 32 mmol/L 26  25  23    Calcium  8.9 - 10.3 mg/dL 9.2  9.1  8.7       RADIOGRAPHIC STUDIES: I have personally reviewed the radiological images as listed and agreed with the findings in the report. No results found.    Orders Placed This Encounter  Procedures   CBC with Differential/Platelet    Standing Status:   Standing    Number of Occurrences:   50    Expiration Date:   06/30/2025   Erythropoietin   Standing Status:   Future    Number of Occurrences:   1    Expiration Date:   06/30/2025   Ferritin    Standing Status:   Standing    Number of Occurrences:   20    Expiration Date:   06/30/2025   All questions were answered. The patient knows to call the clinic with any problems, questions or concerns. No barriers to learning was detected. The total time spent in the appointment was 40 minutes, including review of chart and various tests results, discussions about plan of care and coordination of care plan     Onita Mattock, MD 06/30/2024     "

## 2024-06-30 NOTE — Telephone Encounter (Signed)
 Called to confirm/remind patient of [facility] appointment [facility] on 07/01/24.   Appointment:   [x] Confirmed  [] Left mess   [] No answer/No voice mail  [] VM Full/unable to leave message  [] Phone not in service  Patient reminded to bring all medications and/or complete list.  Confirmed patient has transportation. Gave directions, instructed to utilize valet parking.

## 2024-06-30 NOTE — Telephone Encounter (Signed)
 error

## 2024-07-01 ENCOUNTER — Ambulatory Visit (HOSPITAL_COMMUNITY)
Admission: RE | Admit: 2024-07-01 | Discharge: 2024-07-01 | Disposition: A | Discharge status: Home / Self Care | Source: Ambulatory Visit | Attending: Adult Health | Admitting: Adult Health

## 2024-07-01 ENCOUNTER — Encounter (HOSPITAL_COMMUNITY): Payer: Self-pay

## 2024-07-01 ENCOUNTER — Ambulatory Visit (HOSPITAL_COMMUNITY): Payer: Self-pay | Admitting: Adult Health

## 2024-07-01 VITALS — BP 108/64 | HR 69 | Wt 131.4 lb

## 2024-07-01 DIAGNOSIS — D539 Nutritional anemia, unspecified: Secondary | ICD-10-CM | POA: Diagnosis not present

## 2024-07-01 DIAGNOSIS — Z79899 Other long term (current) drug therapy: Secondary | ICD-10-CM | POA: Insufficient documentation

## 2024-07-01 DIAGNOSIS — I130 Hypertensive heart and chronic kidney disease with heart failure and stage 1 through stage 4 chronic kidney disease, or unspecified chronic kidney disease: Secondary | ICD-10-CM | POA: Diagnosis not present

## 2024-07-01 DIAGNOSIS — I255 Ischemic cardiomyopathy: Secondary | ICD-10-CM | POA: Insufficient documentation

## 2024-07-01 DIAGNOSIS — I2510 Atherosclerotic heart disease of native coronary artery without angina pectoris: Secondary | ICD-10-CM | POA: Diagnosis not present

## 2024-07-01 DIAGNOSIS — Z8551 Personal history of malignant neoplasm of bladder: Secondary | ICD-10-CM | POA: Insufficient documentation

## 2024-07-01 DIAGNOSIS — Z953 Presence of xenogenic heart valve: Secondary | ICD-10-CM | POA: Diagnosis not present

## 2024-07-01 DIAGNOSIS — Z951 Presence of aortocoronary bypass graft: Secondary | ICD-10-CM | POA: Insufficient documentation

## 2024-07-01 DIAGNOSIS — I739 Peripheral vascular disease, unspecified: Secondary | ICD-10-CM | POA: Insufficient documentation

## 2024-07-01 DIAGNOSIS — N1831 Chronic kidney disease, stage 3a: Secondary | ICD-10-CM | POA: Insufficient documentation

## 2024-07-01 DIAGNOSIS — Z5982 Transportation insecurity: Secondary | ICD-10-CM | POA: Insufficient documentation

## 2024-07-01 DIAGNOSIS — I350 Nonrheumatic aortic (valve) stenosis: Secondary | ICD-10-CM | POA: Diagnosis not present

## 2024-07-01 DIAGNOSIS — E875 Hyperkalemia: Secondary | ICD-10-CM | POA: Diagnosis not present

## 2024-07-01 DIAGNOSIS — Z8673 Personal history of transient ischemic attack (TIA), and cerebral infarction without residual deficits: Secondary | ICD-10-CM | POA: Insufficient documentation

## 2024-07-01 DIAGNOSIS — J90 Pleural effusion, not elsewhere classified: Secondary | ICD-10-CM | POA: Insufficient documentation

## 2024-07-01 DIAGNOSIS — I25810 Atherosclerosis of coronary artery bypass graft(s) without angina pectoris: Secondary | ICD-10-CM | POA: Insufficient documentation

## 2024-07-01 DIAGNOSIS — D696 Thrombocytopenia, unspecified: Secondary | ICD-10-CM | POA: Insufficient documentation

## 2024-07-01 DIAGNOSIS — I447 Left bundle-branch block, unspecified: Secondary | ICD-10-CM | POA: Diagnosis not present

## 2024-07-01 DIAGNOSIS — I5022 Chronic systolic (congestive) heart failure: Secondary | ICD-10-CM

## 2024-07-01 DIAGNOSIS — N390 Urinary tract infection, site not specified: Secondary | ICD-10-CM | POA: Insufficient documentation

## 2024-07-01 DIAGNOSIS — R570 Cardiogenic shock: Secondary | ICD-10-CM | POA: Insufficient documentation

## 2024-07-01 DIAGNOSIS — Z59868 Other specified financial insecurity: Secondary | ICD-10-CM | POA: Diagnosis not present

## 2024-07-01 DIAGNOSIS — Z87891 Personal history of nicotine dependence: Secondary | ICD-10-CM | POA: Diagnosis not present

## 2024-07-01 DIAGNOSIS — I252 Old myocardial infarction: Secondary | ICD-10-CM | POA: Diagnosis not present

## 2024-07-01 DIAGNOSIS — D631 Anemia in chronic kidney disease: Secondary | ICD-10-CM | POA: Diagnosis not present

## 2024-07-01 LAB — BASIC METABOLIC PANEL WITH GFR
Anion gap: 9 (ref 5–15)
BUN: 48 mg/dL — ABNORMAL HIGH (ref 8–23)
CO2: 29 mmol/L (ref 22–32)
Calcium: 9.5 mg/dL (ref 8.9–10.3)
Chloride: 101 mmol/L (ref 98–111)
Creatinine, Ser: 1.56 mg/dL — ABNORMAL HIGH (ref 0.61–1.24)
GFR, Estimated: 42 mL/min — ABNORMAL LOW
Glucose, Bld: 77 mg/dL (ref 70–99)
Potassium: 5.6 mmol/L — ABNORMAL HIGH (ref 3.5–5.1)
Sodium: 139 mmol/L (ref 135–145)

## 2024-07-01 LAB — ERYTHROPOIETIN: Erythropoietin: 79.1 m[IU]/mL — ABNORMAL HIGH (ref 2.6–18.5)

## 2024-07-01 NOTE — Telephone Encounter (Addendum)
 Pt aware, agreeable, and verbalized understanding  Labs ordered for Friday   ----- Message from Greig Mosses, NP sent at 07/01/2024  4:24 PM EST ----- K 5.6 . Will need to ensure he took his lasix  today. Needs to avoid high potassium foods. Repeat BMET on Friday. Please call

## 2024-07-01 NOTE — Patient Instructions (Addendum)
 Good to see you today!  Labs done today, your results will be available in MyChart, we will contact you for abnormal readings.  Your physician recommends that you schedule a follow-up appointment: 3 months ( March 2026) ** PLEASE CALL THE OFFICE IN MID JANUARY TO ARRANGE YOUR FOLLOW UP APPOINTMENT.**  If you have any questions or concerns before your next appointment please send us  a message through Julian or call our office at [phone].    TO LEAVE A MESSAGE FOR THE NURSE SELECT OPTION 2, PLEASE LEAVE A MESSAGE INCLUDING: YOUR NAME DATE OF BIRTH CALL BACK NUMBER REASON FOR CALL**this is important as we prioritize the call backs  YOU WILL RECEIVE A CALL BACK THE SAME DAY AS LONG AS YOU CALL BEFORE 4:00 PM At the [facility], you and your health needs are our priority. As part of our continuing mission to provide you with exceptional heart care, we have created designated Provider Care Teams. These Care Teams include your primary Cardiologist (physician) and Advanced Practice Providers (APPs- Physician Assistants and Nurse Practitioners) who all work together to provide you with the care you need, when you need it.   You may see any of the following providers on your designated Care Team at your next follow up: Dr Toribio Fuel Dr Ezra Shuck Dr. Morene Brownie Greig Mosses, NP Caffie Shed, [place] [facility] [place], [place] Beckey Coe, NP Jordan Lee, NP Ellouise Class, NP Tinnie Redman, PharmD Jaun Bash, PharmD   Please be sure to bring in all your medications bottles to every appointment.    Thank you for choosing [facility]

## 2024-07-01 NOTE — Addendum Note (Signed)
 Encounter addended by: Lenetta Greig BIRCH, NP on: 07/01/2024 3:34 PM  Actions taken: Level of Service modified

## 2024-07-01 NOTE — Progress Notes (Signed)
 "  ADVANCED HF CLINIC NOTE   Primary Care: [name] LABOR, MD Primary Cardiologist: Dr. Rolan  HPI: 89 y.o. male with history of CAD w/ prior NSTEMI 1/23 s/p CABG (RIMA to LAD, LIMA to OM1, SVG to OM2), ischemic cardiomyopathy, aortic valve stenosis, hx CVA, PAD, hx bladder cancer, CKD IIIa, LBBB.   EF previously 40-45% on LV gram at time of cath/NSTEMI in 1/23. EF improved to 55-60% on follow-up echo.   Echo 6/25: EF 50-55%, RWMA w/ basal inferior and basal inferoseptal severe HK, RV okay, moderate AS with mean gradient of 23 mmHg and AVA 1.1 cm2 by VTI.   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 EF found to be <20%, down from prior 50-55% in 12/2023. Structural/AHF team have assisted with care.Underwent transfemoral TAVR 04/04/24. Discharged home, weight 131 lbs.  S/P BMBx 11/25  He was seen in the HF clinic 06/24/24. Lasix  was increased to 40 mg twice a day. He reports increased urine output.  Today he returns for HF follow up.Overall feeling fine. He takes care of his wife. Denies SOB/PND/Orthopnea. Appetite ok. No fever or chills. Taking all medications. He uses transportation for appointments.His daughter lives with him. Followed by HF Paramedicine.   Labs (11/25): K 5.9, creatinine 1.57 Labs (12/25): K 4.6, creatinine 1.60  Cardiac Studies - Echo 9/25: EF < 20%, G2DD,  RV moderately reduced, LFLG AS with mean gradient  17 mmHg and AVA 0.77 cm by VTI  - Echo 6/25: EF 50-55%, RWMA w/ basal inferior and basal inferoseptal severe HK, RV okay, moderate AS with mean gradient of 23 mmHg and AVA 1.1 cm2 by VTI.  Past Medical History:  Diagnosis Date   (HFpEF) heart failure with preserved ejection fraction (HCC)    Echo 1/23: Inferior AK, mild aortic stenosis (mean 9 mmHg, V-max 210.5 cm/s, DI 0.70), EF 55-60, GR 1 DD, normal RVSF, normal PASP, trivial MR, mild AI, borderline dilation of aortic root  (39 mm)   Acid reflux    hiatal hernia   Anemia    Bladder cancer (HCC)    CAD (coronary artery disease)    S/p non-STEMI 1/23 >> s/p CABG   Carotid artery disease 08/23/2021   Pre-CABG Dopplers 1/23:R ICA 40-59; L ICA 1-39 // Carotid US  07/20/2023: RICA 40-59; LICA 1-39   CHF (congestive heart failure) (HCC)    Chronic kidney disease (CKD)    Coronary artery disease involving native coronary artery of native heart without angina pectoris 05/16/2022   Inf NSTEMI s/p CABG in 07/2021 (RIMA-LAD, LIMA-OM1, S-OM2)   Diverticulitis    Erectile dysfunction 04/20/2015   Essential hypertension 04/20/2015   History of colon polyps    History of kidney stones    History of stroke    Noted on brain MRI 07/2021   Hyperlipidemia 05/28/2015   PAD (peripheral artery disease)    Pre-CABG Dopplers 1/23: Right ABI 0.65; left ABI 0.82   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 Wendel and Dr. Aneita   Skin cancer    Stroke [facility])    noted MRI 2023 pt. unaware    Current Outpatient Medications  Medication Sig Dispense Refill   carvedilol  (COREG ) 3.125 MG tablet Take 1 tablet (3.125 mg total) by mouth 2 (two) times daily with a meal. 60 tablet 2   furosemide  (LASIX ) 40 MG tablet Take  1 tablet (40 mg total) by mouth 2 (two) times daily. 90 tablet 3   nitroGLYCERIN  (NITROSTAT ) 0.4 MG SL tablet Place 1 tablet (0.4 mg total) under the tongue every 5 (five) minutes as needed for chest pain. 25 tablet 3   rosuvastatin  (CRESTOR ) 10 MG tablet Take 1 tablet (10 mg total) by mouth daily. 30 tablet 2   No current facility-administered medications for this encounter.    Allergies  Allergen Reactions   Lipitor [Atorvastatin]     Muscle soreness      Social History   Socioeconomic History   Marital status: Married    Spouse name: Not on file   Number of children: 1   Years of education: 12   Highest education level:  Not on file  Occupational History   Occupation: Retired  Tobacco Use   Smoking status: Former    Types: Cigars   Smokeless tobacco: Never   Tobacco comments:    Quit 2016  Vaping Use   Vaping status: Never Used  Substance and Sexual Activity   Alcohol use: Yes    Comment: Occasional beer   Drug use: No   Sexual activity: Not Currently  Other Topics Concern   Not on file  Social History Narrative   Lives with wife and daughter lives with them also/2025   Caffeine use: 1-2 cups coffee in the am and 1 glass tea qpm   Social Drivers of Health   Tobacco Use: Medium Risk (07/01/2024)   Patient History    Smoking Tobacco Use: Former    Smokeless Tobacco Use: Never    Passive Exposure: Not on file  Financial Resource Strain: Medium Risk (11/23/2023)   Overall Financial Resource Strain (CARDIA)    Difficulty of Paying Living Expenses: Somewhat hard  Food Insecurity: No Food Insecurity (06/09/2024)   Epic    Worried About Programme Researcher, Broadcasting/film/video in the Last Year: Never true    Ran Out of Food in the Last Year: Never true  Transportation Needs: Unmet Transportation Needs (06/26/2024)   Epic    Lack of Transportation (Medical): Yes    Lack of Transportation (Non-Medical): Yes  Physical Activity: Inactive (11/23/2023)   Exercise Vital Sign    Days of Exercise per Week: 0 days    Minutes of Exercise per Session: 0 min  Stress: No Stress Concern Present (11/23/2023)   [employer] Occupational Health - Occupational Stress Questionnaire    Feeling of Stress : Only a little  Social Connections: Moderately Isolated (03/26/2024)   Social Connection and Isolation Panel    Frequency of Communication with Friends and Family: Once a week    Frequency of Social Gatherings with Friends and Family: Never    Attends Religious Services: 1 to 4 times per year    Active Member of [employer] or Organizations: No    Attends Banker Meetings: Never    Marital Status: Married  Careers Information Officer Violence: Not At Risk (06/09/2024)   Epic    Fear of Current or Ex-Partner: No    Emotionally Abused: No    Physically Abused: No    Sexually Abused: No  Depression (PHQ2-9): Low Risk (06/30/2024)   Depression (PHQ2-9)    PHQ-2 Score: 0  Alcohol Screen: Low Risk (11/23/2023)   Alcohol Screen    Last Alcohol Screening Score (AUDIT): 0  Housing: Low Risk (06/09/2024)   Epic    Unable to Pay for Housing in the Last Year: No  Number of Times Moved in the Last Year: 0    Homeless in the Last Year: No  Utilities: Not At Risk (06/09/2024)   Epic    Threatened with loss of utilities: No  Health Literacy: Adequate Health Literacy (11/23/2023)   B1300 Health Literacy    Frequency of need for help with medical instructions: Never   Family History  Problem Relation Age of Onset   Stroke Father 33   Sudden death Mother 59       unknown cause   Wt Readings from Last 3 Encounters:  07/01/24 59.6 kg (131 lb 6.4 oz)  06/30/24 60.3 kg (132 lb 14.4 oz)  06/25/24 61.7 kg (136 lb)   BP 108/64   Pulse 69   Wt 59.6 kg (131 lb 6.4 oz)   SpO2 99%   BMI 22.55 kg/m   PHYSICAL EXAM: General:   No resp difficulty Neck: no JVD.  Cor: Regular rate & rhythm.  Lungs: clear Abdomen: soft, nontender, nondistended.  Extremities: R and LLE 1+  edema Neuro: alert & oriented x3  ASSESSMENT & PLAN: 1. Chronic systolic CHF with cardiogenic shock, pleural effusions requiring thoracentesis: Echo 6/25 EF 50-55% w/ reported moderate AS. Admitted 9/25 with CGS, with LFLG AS. Echo 9/25 showed EF < 20%, severe AS. Underwent TAVR 04/05/24, POD1 echo showed EF up to 35-40% bioprosthetic aortic valve functioning normally. Low EF at admission may be due to combination of severe AS and LBBB. NYHA  II. Appears euvolemic. Continue lasix  40 mg twice a day. .  - Continue Coreg  3.125 mg bid - Off spiro with hyperkalemia and need for Lokelma , could re-challenge with paramedicine on-board and trial with scheduled  Lokelma . - Off SGLT2i for now with confirmed UTI - Continue compression hose.  2. Severe AS: s/p TAVR 04/04/24. Echo 04/05/24 with EF up to 35-40% and normal functioning bioprosthetic aortic valve functioning - no antiplatelets in setting of anemia/thrombocytopenia - Repeat echo later this week.  3. CAD: NSTEMI 2023 s/p CABG X 3. Per notes cardiac CT demonstrated patent RIMA-LAD and LIMA-OM1 as well as an occluded SVG-->OM which was not felt to explain his cardiomyopathy, angiography deferred. No chest pain - Continue Crestor  10 mg daily. - No aspirin  with significant anemia and thrombocytopenia.  4. Chronic macrocytic anemia: denies over bleeding. Hgb averaging in 7s - Follows with Dr. Lanny. 5. CKD IIIa: Baseline Scr 1.2-1.3. Most creatinine 1.4  - BMET today 6. Question of Afib during last admission: EKGs has shown NSR/SB with LBBB, will need to follow conduction. Regular on exam today. - Not a great candidate for [facility] given anemia/thrombocytopenia.  7. Thrombocytopenia: Platelets - Not on any form of heparin  at this time.  - Heme/Onc felt  thromboctyopenia likely 2/2 severe AS and recent TAVR.  - s/p BMBx,  results showed 5% blasts, ? Myeloid neoplasm. Needs follow up with Dr. Lanny to review results.   Echo on Friday. I personally set up his pill box for the next 7 days. I have updated HF Paramedicine. Follow up with Dr Rolan in 3 months.   Kataleya Zaugg NP-C   07/01/2024  "

## 2024-07-04 ENCOUNTER — Ambulatory Visit (HOSPITAL_COMMUNITY): Payer: Self-pay | Admitting: Adult Health

## 2024-07-04 ENCOUNTER — Ambulatory Visit (HOSPITAL_COMMUNITY)
Admission: RE | Admit: 2024-07-04 | Discharge: 2024-07-04 | Disposition: A | Discharge status: Home / Self Care | Source: Ambulatory Visit | Attending: Hematology | Admitting: Hematology

## 2024-07-04 DIAGNOSIS — I5022 Chronic systolic (congestive) heart failure: Secondary | ICD-10-CM

## 2024-07-04 DIAGNOSIS — I083 Combined rheumatic disorders of mitral, aortic and tricuspid valves: Secondary | ICD-10-CM | POA: Insufficient documentation

## 2024-07-04 DIAGNOSIS — I77810 Thoracic aortic ectasia: Secondary | ICD-10-CM | POA: Diagnosis not present

## 2024-07-04 DIAGNOSIS — I371 Nonrheumatic pulmonary valve insufficiency: Secondary | ICD-10-CM | POA: Insufficient documentation

## 2024-07-04 DIAGNOSIS — I2510 Atherosclerotic heart disease of native coronary artery without angina pectoris: Secondary | ICD-10-CM | POA: Diagnosis not present

## 2024-07-04 DIAGNOSIS — D649 Anemia, unspecified: Secondary | ICD-10-CM

## 2024-07-04 DIAGNOSIS — I110 Hypertensive heart disease with heart failure: Secondary | ICD-10-CM | POA: Insufficient documentation

## 2024-07-04 DIAGNOSIS — Z952 Presence of prosthetic heart valve: Secondary | ICD-10-CM | POA: Insufficient documentation

## 2024-07-04 LAB — ECHOCARDIOGRAM COMPLETE
AR max vel: 0.76 cm2
AV Area VTI: 0.75 cm2
AV Area mean vel: 0.77 cm2
AV Mean grad: 10 mmHg
AV Peak grad: 19.4 mmHg
Ao pk vel: 2.2 m/s
Calc EF: 37.9 %
MV VTI: 1.16 cm2
Single Plane A2C EF: 44.6 %
Single Plane A4C EF: 26.1 %

## 2024-07-04 LAB — BASIC METABOLIC PANEL WITH GFR
Anion gap: 10 (ref 5–15)
BUN: 46 mg/dL — ABNORMAL HIGH (ref 8–23)
CO2: 29 mmol/L (ref 22–32)
Calcium: 9.3 mg/dL (ref 8.9–10.3)
Chloride: 99 mmol/L (ref 98–111)
Creatinine, Ser: 1.62 mg/dL — ABNORMAL HIGH (ref 0.61–1.24)
GFR, Estimated: 41 mL/min — ABNORMAL LOW
Glucose, Bld: 76 mg/dL (ref 70–99)
Potassium: 4.8 mmol/L (ref 3.5–5.1)
Sodium: 137 mmol/L (ref 135–145)

## 2024-07-04 LAB — PREPARE RBC (CROSSMATCH)

## 2024-07-04 MED ORDER — SODIUM CHLORIDE 0.9% IV SOLUTION: Freq: Once | INTRAVENOUS | Status: DC

## 2024-07-05 LAB — TYPE AND SCREEN
ABO/RH(D): A POS
Antibody Screen: NEGATIVE
Unit division: 0

## 2024-07-05 LAB — BPAM RBC
Blood Product Expiration Date: 202601192359
ISSUE DATE / TIME: 202512260939
Unit Type and Rh: 6200

## 2024-07-07 ENCOUNTER — Other Ambulatory Visit (HOSPITAL_COMMUNITY): Payer: Self-pay

## 2024-07-08 ENCOUNTER — Ambulatory Visit (HOSPITAL_COMMUNITY): Payer: Self-pay | Admitting: Family Medicine

## 2024-07-08 ENCOUNTER — Other Ambulatory Visit (HOSPITAL_COMMUNITY): Payer: Self-pay

## 2024-07-08 ENCOUNTER — Other Ambulatory Visit: Payer: Self-pay

## 2024-07-08 NOTE — Progress Notes (Signed)
 Paramedicine Encounter    Patient ID: Danny [name], male    DOB: 1933/10/17, 89 y.o.   MRN: [id number]   Complaints- none   Assessment- CAOX4, warm and dry, ambulatory without shortness of breath, some lower leg edema still present but improved, lungs clear, vitals within normal  Compliance with meds- missed two doses of morning and two evening doses   Pill box filled- for one week   Refills needed- carvedilol    Meds changes since last visit- none     Social changes- none    VISIT SUMMARY- Arrived for home visit for Danny [name] who was CAOX4, warm and dry ambulatory without shortness of breath or difficulty. Vitals obtained and within normal limits. Lungs clear. Some lower leg edema still noted but improved. Meds reviewed and confirmed and pill box filled for one week. HF education provided and discussed. Appointments reviewed. Home visit complete. I plan to see Danny [name] in one week.   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   Palliative care by specialist 04/04/2024   Aortic stenosis, severe 04/04/2024   Pleural effusion on left 04/01/2024   Coronary artery disease 03/27/2024   Demand ischemia (HCC) 03/26/2024   Elevated troponin 03/26/2024   Acute kidney injury superimposed on chronic kidney disease 03/26/2024   Transaminitis 03/26/2024   Hyperkalemia 03/26/2024   History of bladder cancer 03/26/2024   Hyperlipidemia 03/26/2024   Generalized weakness 03/26/2024   Bradycardia with  31-40 beats per minute 10/10/2023   Bradycardia 10/10/2023   Scalp abrasion 01/11/2023   Bladder cancer (HCC) 01/11/2023   Coronary artery disease involving native coronary artery of native heart without angina pectoris 05/16/2022   Painless hematuria 01/19/2022   Onychomycosis 08/23/2021   Acute on chronic systolic CHF (congestive heart failure) (HCC) 08/23/2021   PAD (peripheral artery disease) 08/23/2021   Carotid artery disease 08/23/2021   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/2021   Aortic stenosis 07/30/2021   B12 deficiency 07/29/2021   MDS (myelodysplastic syndrome) (HCC) 07/28/2021   Macrocytic anemia 07/28/2021   Seizure-like activity (HCC) 07/27/2021   Generalized abdominal pain 09/30/2020   Loss of weight 09/30/2020   Chronic kidney disease, stage 3a (HCC) 11/21/2019   Transient alteration of awareness 07/25/2018   Syncope 07/25/2018   Hyperlipidemia LDL goal <70 05/28/2015   Medicare annual wellness visit, subsequent 04/20/2015   Erectile dysfunction 04/20/2015   Essential hypertension 04/20/2015   Current Medications[1] Allergies[2]   Social History   Socioeconomic History   Marital status: Married    Spouse name: Not on file   Number of children: 1   Years of education: 12   Highest education level: Not on file  Occupational History   Occupation: Retired  Tobacco Use   Smoking status: Former    Types: Cigars   Smokeless tobacco: Never   Tobacco comments:    Quit 2016  Vaping Use   Vaping status: Never Used  Substance and Sexual  Activity   Alcohol use: Yes    Comment: Occasional beer   Drug use: No   Sexual activity: Not Currently  Other Topics Concern   Not on file  Social History Narrative   Lives with wife and daughter lives with them also/2025   Caffeine use: 1-2 cups coffee in the am and 1 glass tea qpm   Social Drivers of Health   Tobacco Use: Medium Risk (07/01/2024)   Patient History    Smoking Tobacco Use: Former    Smokeless Tobacco Use: Never     Passive Exposure: Not on file  Financial Resource Strain: Medium Risk (11/23/2023)   Overall Financial Resource Strain (CARDIA)    Difficulty of Paying Living Expenses: Somewhat hard  Food Insecurity: No Food Insecurity (06/09/2024)   Epic    Worried About Programme Researcher, Broadcasting/film/video in the Last Year: Never true    Ran Out of Food in the Last Year: Never true  Transportation Needs: Unmet Transportation Needs (06/26/2024)   Epic    Lack of Transportation (Medical): Yes    Lack of Transportation (Non-Medical): Yes  Physical Activity: Inactive (11/23/2023)   Exercise Vital Sign    Days of Exercise per Week: 0 days    Minutes of Exercise per Session: 0 min  Stress: No Stress Concern Present (11/23/2023)   [employer]-davidson of Occupational Health - Occupational Stress Questionnaire    Feeling of Stress : Only a little  Social Connections: Moderately Isolated (03/26/2024)   Social Connection and Isolation Panel    Frequency of Communication with Friends and Family: Once a week    Frequency of Social Gatherings with Friends and Family: Never    Attends Religious Services: 1 to 4 times per year    Active Member of Clubs or Organizations: No    Attends Banker Meetings: Never    Marital Status: Married  Catering Manager Violence: Not At Risk (06/09/2024)   Epic    Fear of Current or Ex-Partner: No    Emotionally Abused: No    Physically Abused: No    Sexually Abused: No  Depression (PHQ2-9): Low Risk (06/30/2024)   Depression (PHQ2-9)    PHQ-2 Score: 0  Alcohol Screen: Low Risk (11/23/2023)   Alcohol Screen    Last Alcohol Screening Score (AUDIT): 0  Housing: Low Risk (06/09/2024)   Epic    Unable to Pay for Housing in the Last Year: No    Number of Times Moved in the Last Year: 0    Homeless in the Last Year: No  Utilities: Not At Risk (06/09/2024)   Epic    Threatened with loss of utilities: No  Health Literacy: Adequate Health Literacy (11/23/2023)   B1300 Health Literacy     Frequency of need for help with medical instructions: Never    Physical Exam      Future Appointments  Date Time Provider Department Center  07/09/2024  1:00 PM [facility] FLUSH [facility] None  07/21/2024 11:00 AM [facility]-VASC 10 [facility]-ULTRA H&V  07/23/2024  1:00 PM Mertel, [place] , RN [facility]POPH None  07/23/2024  2:15 PM [facility]ONC LAB [facility] None  07/23/2024  3:00 PM [facility] FLUSH [facility] None  08/07/2024 12:45 PM [facility]-ONC LAB [facility] None  08/07/2024  1:20 PM Lanny Callander, MD [facility] None  08/07/2024  1:45 PM [facility] FLUSH [facility] None  11/27/2024 10:50 AM [facility]-ANNUAL WELLNESS VISIT [facility]  04/09/2025 12:50 PM [facility]-ECHO 2 [facility]-ECHO H&V  04/09/2025  2:20 PM Sebastian Collar  R, PA-C [facility]-MAGST H&V                 [1]  Current Outpatient Medications:    carvedilol  (COREG ) 3.125 MG tablet, Take 1 tablet (3.125 mg total) by mouth 2 (two) times daily with a meal., Disp: 60 tablet, Rfl: 2   furosemide  (LASIX ) 40 MG tablet, Take 1 tablet (40 mg total) by mouth 2 (two) times daily., Disp: 90 tablet, Rfl: 3   nitroGLYCERIN  (NITROSTAT ) 0.4 MG SL tablet, Place 1 tablet (0.4 mg total) under the tongue every 5 (five) minutes as needed for chest pain., Disp: 25 tablet, Rfl: 3   rosuvastatin  (CRESTOR ) 10 MG tablet, Take 1 tablet (10 mg total) by mouth daily., Disp: 30 tablet, Rfl: 2 [2]  Allergies Allergen Reactions   Lipitor [Atorvastatin]     Muscle soreness

## 2024-07-09 ENCOUNTER — Inpatient Hospital Stay

## 2024-07-09 VITALS — BP 116/85 | HR 68 | Temp 97.7°F | Resp 17

## 2024-07-09 DIAGNOSIS — D649 Anemia, unspecified: Secondary | ICD-10-CM

## 2024-07-09 DIAGNOSIS — D469 Myelodysplastic syndrome, unspecified: Secondary | ICD-10-CM

## 2024-07-09 DIAGNOSIS — D46Z Other myelodysplastic syndromes: Secondary | ICD-10-CM | POA: Diagnosis not present

## 2024-07-09 DIAGNOSIS — C679 Malignant neoplasm of bladder, unspecified: Secondary | ICD-10-CM

## 2024-07-09 LAB — CBC WITH DIFFERENTIAL/PLATELET
Abs Immature Granulocytes: 0.02 K/uL (ref 0.00–0.07)
Basophils Absolute: 0 K/uL (ref 0.0–0.1)
Basophils Relative: 0 %
Eosinophils Absolute: 0 K/uL (ref 0.0–0.5)
Eosinophils Relative: 1 %
HCT: 26.9 % — ABNORMAL LOW (ref 39.0–52.0)
Hemoglobin: 9.1 g/dL — ABNORMAL LOW (ref 13.0–17.0)
Immature Granulocytes: 1 %
Lymphocytes Relative: 34 %
Lymphs Abs: 1.4 K/uL (ref 0.7–4.0)
MCH: 34.7 pg — ABNORMAL HIGH (ref 26.0–34.0)
MCHC: 33.8 g/dL (ref 30.0–36.0)
MCV: 102.7 fL — ABNORMAL HIGH (ref 80.0–100.0)
Monocytes Absolute: 0.2 K/uL (ref 0.1–1.0)
Monocytes Relative: 5 %
Neutro Abs: 2.5 K/uL (ref 1.7–7.7)
Neutrophils Relative %: 59 %
Platelets: 92 K/uL — ABNORMAL LOW (ref 150–400)
RBC: 2.62 MIL/uL — ABNORMAL LOW (ref 4.22–5.81)
RDW: 19.7 % — ABNORMAL HIGH (ref 11.5–15.5)
WBC: 4.1 K/uL (ref 4.0–10.5)
nRBC: 0 % (ref 0.0–0.2)

## 2024-07-09 LAB — SAMPLE TO BLOOD BANK

## 2024-07-09 MED ORDER — EPOETIN ALFA-EPBX 10000 UNIT/ML IJ SOLN
10000.0000 [IU] | Freq: Once | INTRAMUSCULAR | Status: AC
  Administered 2024-07-09: 10000 [IU] via SUBCUTANEOUS
  Filled 2024-07-09: qty 1

## 2024-07-14 ENCOUNTER — Other Ambulatory Visit (HOSPITAL_COMMUNITY): Payer: Self-pay

## 2024-07-15 NOTE — Progress Notes (Signed)
 Paramedicine Encounter    Patient ID: Danny [name], male    DOB: 1934/05/17, 89 y.o.   MRN: [id number]   Complaints- none   Assessment- CAOX4, warm and dry, ambulatory without shortness of breath, improved lower leg swelling, lungs clear, vitals within normal limits.   Compliance with meds- no missed doses   Pill box filled- for one week   Refills needed- none   Meds changes since last visit- none     Social changes- still needing help with transportation    VISIT SUMMARY- Arrived for home visit for [facility] who reports to be feeling well with no complaints today. He is normotensive with no weight gain. Minimal lower leg edema, lungs clear. Meds reviewed- no missed doses. Pill box filled for one week. Appointments reviewed and confirmed. He continues to lack transportation. He has not contacted the resources provided- I reminded him and his daughter to do so- they plan to. I plan to see him in one week. HF education provided and he is doing well with same. Home visit complete.   BP 108/62   Pulse 72   Resp 16   Wt 127 lb 3.2 oz (57.7 kg)   BMI 21.83 kg/m  Weight yesterday-- 128  Last visit weight-- 129      ACTION: Home visit completed     Patient Care Team: [name] LABOR, MD as PCP - General (Internal Medicine) Wendel Lurena POUR, MD as PCP - Structural Heart (Cardiology) Lanny Callander, MD as Consulting Physician (Hematology) Prentiss Heddy HERO, RN as [facility] Care Management  Patient Active Problem List   Diagnosis Date Noted   S/P TAVR (transcatheter aortic valve replacement) 04/04/2024   Palliative care by specialist 04/04/2024   Aortic stenosis, severe 04/04/2024   Pleural effusion on left 04/01/2024   Coronary artery disease 03/27/2024   Demand ischemia (HCC) 03/26/2024   Elevated troponin 03/26/2024   Acute kidney injury superimposed on chronic kidney disease 03/26/2024   Transaminitis 03/26/2024   Hyperkalemia 03/26/2024   History of bladder cancer  03/26/2024   Hyperlipidemia 03/26/2024   Generalized weakness 03/26/2024   Bradycardia with 31-40 beats per minute 10/10/2023   Bradycardia 10/10/2023   Scalp abrasion 01/11/2023   Bladder cancer (HCC) 01/11/2023   Coronary artery disease involving native coronary artery of native heart without angina pectoris 05/16/2022   Painless hematuria 01/19/2022   Onychomycosis 08/23/2021   Acute on chronic systolic CHF (congestive heart failure) (HCC) 08/23/2021   PAD (peripheral artery disease) 08/23/2021   Carotid artery disease 08/23/2021   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/2021   Aortic stenosis 07/30/2021   B12 deficiency 07/29/2021   MDS (myelodysplastic syndrome) (HCC) 07/28/2021   Macrocytic anemia 07/28/2021   Seizure-like activity (HCC) 07/27/2021   Generalized abdominal pain 09/30/2020   Loss of weight 09/30/2020   Chronic kidney disease, stage 3a (HCC) 11/21/2019   Transient alteration of awareness 07/25/2018   Syncope 07/25/2018   Hyperlipidemia LDL goal <70 05/28/2015   Medicare annual wellness visit, subsequent 04/20/2015   Erectile dysfunction 04/20/2015   Essential hypertension 04/20/2015   Current Medications[1] Allergies[2]   Social History   Socioeconomic History   Marital status: Married    Spouse name: Not on file   Number of children: 1   Years of education: 12   Highest education level: Not on file  Occupational History   Occupation: Retired  Tobacco Use   Smoking status: Former    Types: Cigars   Smokeless tobacco: Never   Tobacco  comments:    Quit 2016  Vaping Use   Vaping status: Never Used  Substance and Sexual Activity   Alcohol use: Yes    Comment: Occasional beer   Drug use: No   Sexual activity: Not Currently  Other Topics Concern   Not on file  Social History Narrative   Lives with wife and daughter lives with them also/2025   Caffeine use: 1-2 cups coffee in the am and 1 glass tea qpm   Social Drivers of Health   Tobacco Use: Medium Risk  (07/01/2024)   Patient History    Smoking Tobacco Use: Former    Smokeless Tobacco Use: Never    Passive Exposure: Not on file  Financial Resource Strain: Medium Risk (11/23/2023)   Overall Financial Resource Strain (CARDIA)    Difficulty of Paying Living Expenses: Somewhat hard  Food Insecurity: No Food Insecurity (06/09/2024)   Epic    Worried About Programme Researcher, Broadcasting/film/video in the Last Year: Never true    Ran Out of Food in the Last Year: Never true  Transportation Needs: Unmet Transportation Needs (06/26/2024)   Epic    Lack of Transportation (Medical): Yes    Lack of Transportation (Non-Medical): Yes  Physical Activity: Inactive (11/23/2023)   Exercise Vital Sign    Days of Exercise per Week: 0 days    Minutes of Exercise per Session: 0 min  Stress: No Stress Concern Present (11/23/2023)   [employer] - Occupational Stress Questionnaire    Feeling of Stress : Only a little  Social Connections: Moderately Isolated (03/26/2024)   Social Connection and Isolation Panel    Frequency of Communication with Friends and Family: Once a week    Frequency of Social Gatherings with Friends and Family: Never    Attends Religious Services: 1 to 4 times per year    Active Member of Clubs or Organizations: No    Attends Banker Meetings: Never    Marital Status: Married  Catering Manager Violence: Not At Risk (06/09/2024)   Epic    Fear of Current or Ex-Partner: No    Emotionally Abused: No    Physically Abused: No    Sexually Abused: No  Depression (PHQ2-9): Low Risk (06/30/2024)   Depression (PHQ2-9)    PHQ-2 Score: 0  Alcohol Screen: Low Risk (11/23/2023)   Alcohol Screen    Last Alcohol Screening Score (AUDIT): 0  Housing: Low Risk (06/09/2024)   Epic    Unable to Pay for Housing in the Last Year: No    Number of Times Moved in the Last Year: 0    Homeless in the Last Year: No  Utilities: Not At Risk (06/09/2024)   Epic    Threatened with loss of  utilities: No  Health Literacy: Adequate Health Literacy (11/23/2023)   B1300 Health Literacy    Frequency of need for help with medical instructions: Never    Physical Exam      Future Appointments  Date Time Provider Department Center  07/21/2024 11:00 AM [facility]-VASC 10 [facility]-ULTRA H&V  07/23/2024  1:00 PM Mertel, [place] , RN [facility] None  07/23/2024  2:15 PM [facility] LAB [facility] None  07/23/2024  3:00 PM [facility] FLUSH [facility] None  08/07/2024 12:45 PM [facility] LAB [facility] None  08/07/2024  1:20 PM Lanny Callander, MD [facility] None  08/07/2024  1:45 PM [facility] FLUSH [facility] None  11/27/2024 10:50 AM [facility]-ANNUAL WELLNESS VISIT [facility]  04/09/2025 12:50 PM HVC-ECHO 2  HVC-ECHO H&V  04/09/2025  2:20 PM Sebastian Lamarr SAUNDERS, PA-C [facility] H&V                 [1]  Current Outpatient Medications:    carvedilol  (COREG ) 3.125 MG tablet, Take 1 tablet (3.125 mg total) by mouth 2 (two) times daily with a meal., Disp: 60 tablet, Rfl: 2   furosemide  (LASIX ) 40 MG tablet, Take 1 tablet (40 mg total) by mouth 2 (two) times daily., Disp: 90 tablet, Rfl: 3   nitroGLYCERIN  (NITROSTAT ) 0.4 MG SL tablet, Place 1 tablet (0.4 mg total) under the tongue every 5 (five) minutes as needed for chest pain., Disp: 25 tablet, Rfl: 3   rosuvastatin  (CRESTOR ) 10 MG tablet, Take 1 tablet (10 mg total) by mouth daily., Disp: 30 tablet, Rfl: 2 [2]  Allergies Allergen Reactions   Lipitor [Atorvastatin]     Muscle soreness

## 2024-07-21 ENCOUNTER — Other Ambulatory Visit (HOSPITAL_COMMUNITY): Payer: Self-pay

## 2024-07-21 ENCOUNTER — Ambulatory Visit (HOSPITAL_COMMUNITY)
Admission: RE | Admit: 2024-07-21 | Discharge: 2024-07-21 | Disposition: A | Discharge status: Home / Self Care | Source: Ambulatory Visit | Attending: Physician Assistant | Admitting: Physician Assistant

## 2024-07-21 DIAGNOSIS — I6523 Occlusion and stenosis of bilateral carotid arteries: Secondary | ICD-10-CM | POA: Insufficient documentation

## 2024-07-21 NOTE — Progress Notes (Signed)
 Paramedicine Encounter    Patient ID: Danny [name], male    DOB: 1933/09/03, 89 y.o.   MRN: [id number]   Complaints- none   Assessment- CAOX4, warm and dry ambulatory, no shortness of breath, no edema, lungs clear.   Compliance with meds- no missed doses   Pill box filled- for one week   Refills needed- none   Meds changes since last visit- none     Social changes- still needing transportation assistance    VISIT SUMMARY- Arrived for home visit for [facility] who reports to be feeling well with no complaints. No swelling, lungs clear. He was seen today for a carotid ultrasound and they obtained vitals during visit. I reviewed meds and confirmed pill box filled for one week. No refills needed. He and I reviewed upcoming visits and confirmed same. He still needs transportation assistance I will reprint same and review with him and his daughter. He appreciates same. Home visit complete.   Will need to see Dr. Rolan in March (prefers afternoon)   Wt 128 lb (58.1 kg)   BMI 21.97 kg/m  Weight yesterday-- 126  Last visit weight-- 127      ACTION: Home visit completed     Patient Care Team: [name] LABOR, MD as PCP - General (Internal Medicine) Wendel Lurena POUR, MD as PCP - Structural Heart (Cardiology) Lanny Callander, MD as Consulting Physician (Hematology) Prentiss Heddy HERO, RN as [facility] Care Management  Patient Active Problem List   Diagnosis Date Noted   S/P TAVR (transcatheter aortic valve replacement) 04/04/2024   Palliative care by specialist 04/04/2024   Aortic stenosis, severe 04/04/2024   Pleural effusion on left 04/01/2024   Coronary artery disease 03/27/2024   Demand ischemia (HCC) 03/26/2024   Elevated troponin 03/26/2024   Acute kidney injury superimposed on chronic kidney disease 03/26/2024   Transaminitis 03/26/2024   Hyperkalemia 03/26/2024   History of bladder cancer 03/26/2024   Hyperlipidemia 03/26/2024   Generalized weakness 03/26/2024    Bradycardia with 31-40 beats per minute 10/10/2023   Bradycardia 10/10/2023   Scalp abrasion 01/11/2023   Bladder cancer (HCC) 01/11/2023   Coronary artery disease involving native coronary artery of native heart without angina pectoris 05/16/2022   Painless hematuria 01/19/2022   Onychomycosis 08/23/2021   Acute on chronic systolic CHF (congestive heart failure) (HCC) 08/23/2021   PAD (peripheral artery disease) 08/23/2021   Carotid artery disease 08/23/2021   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/2021   Aortic stenosis 07/30/2021   B12 deficiency 07/29/2021   MDS (myelodysplastic syndrome) (HCC) 07/28/2021   Macrocytic anemia 07/28/2021   Seizure-like activity (HCC) 07/27/2021   Generalized abdominal pain 09/30/2020   Loss of weight 09/30/2020   Chronic kidney disease, stage 3a (HCC) 11/21/2019   Transient alteration of awareness 07/25/2018   Syncope 07/25/2018   Hyperlipidemia LDL goal <70 05/28/2015   Medicare annual wellness visit, subsequent 04/20/2015   Erectile dysfunction 04/20/2015   Essential hypertension 04/20/2015   Current Medications[1] Allergies[2]   Social History   Socioeconomic History   Marital status: Married    Spouse name: Not on file   Number of children: 1   Years of education: 12   Highest education level: Not on file  Occupational History   Occupation: Retired  Tobacco Use   Smoking status: Former    Types: Cigars   Smokeless tobacco: Never   Tobacco comments:    Quit 2016  Vaping Use   Vaping status: Never Used  Substance and Sexual Activity  Alcohol use: Yes    Comment: Occasional beer   Drug use: No   Sexual activity: Not Currently  Other Topics Concern   Not on file  Social History Narrative   Lives with wife and daughter lives with them also/2025   Caffeine use: 1-2 cups coffee in the am and 1 glass tea qpm   Social Drivers of Health   Tobacco Use: Medium Risk (07/01/2024)   Patient History    Smoking Tobacco Use: Former    Smokeless  Tobacco Use: Never    Passive Exposure: Not on file  Financial Resource Strain: Medium Risk (11/23/2023)   Overall Financial Resource Strain (CARDIA)    Difficulty of Paying Living Expenses: Somewhat hard  Food Insecurity: No Food Insecurity (06/09/2024)   Epic    Worried About Programme Researcher, Broadcasting/film/video in the Last Year: Never true    Ran Out of Food in the Last Year: Never true  Transportation Needs: Unmet Transportation Needs (06/26/2024)   Epic    Lack of Transportation (Medical): Yes    Lack of Transportation (Non-Medical): Yes  Physical Activity: Inactive (11/23/2023)   Exercise Vital Sign    Days of Exercise per Week: 0 days    Minutes of Exercise per Session: 0 min  Stress: No Stress Concern Present (11/23/2023)   [employer] - Occupational Stress Questionnaire    Feeling of Stress : Only a little  Social Connections: Moderately Isolated (03/26/2024)   Social Connection and Isolation Panel    Frequency of Communication with Friends and Family: Once a week    Frequency of Social Gatherings with Friends and Family: Never    Attends Religious Services: 1 to 4 times per year    Active Member of Clubs or Organizations: No    Attends Banker Meetings: Never    Marital Status: Married  Catering Manager Violence: Not At Risk (06/09/2024)   Epic    Fear of Current or Ex-Partner: No    Emotionally Abused: No    Physically Abused: No    Sexually Abused: No  Depression (PHQ2-9): Low Risk (06/30/2024)   Depression (PHQ2-9)    PHQ-2 Score: 0  Alcohol Screen: Low Risk (11/23/2023)   Alcohol Screen    Last Alcohol Screening Score (AUDIT): 0  Housing: Low Risk (06/09/2024)   Epic    Unable to Pay for Housing in the Last Year: No    Number of Times Moved in the Last Year: 0    Homeless in the Last Year: No  Utilities: Not At Risk (06/09/2024)   Epic    Threatened with loss of utilities: No  Health Literacy: Adequate Health Literacy (11/23/2023)   B1300  Health Literacy    Frequency of need for help with medical instructions: Never    Physical Exam      Future Appointments  Date Time Provider Department Center  07/23/2024  1:00 PM Mertel, [place] , RN [facility] None  07/23/2024  2:15 PM [facility] LAB [facility] None  07/23/2024  3:00 PM [facility] FLUSH [facility] None  08/07/2024 12:45 PM [facility] LAB [facility] None  08/07/2024  1:20 PM Lanny Callander, MD [facility]MED[facility] None  08/07/2024  1:45 PM [facility] FLUSH [facility] None  11/27/2024 10:50 AM [facility]-ANNUAL WELLNESS VISIT [facility]  04/09/2025 12:50 PM HVC-ECHO 2 HVC-ECHO H&V  04/09/2025  2:20 PM Sebastian Lamarr SAUNDERS, PA-C [facility]-MAG[facility] H&V                 [1]  Current Outpatient Medications:    carvedilol  (COREG ) 3.125 MG tablet, Take 1 tablet (3.125 mg total) by mouth 2 (two) times daily with a meal., Disp: 60 tablet, Rfl: 2   furosemide  (LASIX ) 40 MG tablet, Take 1 tablet (40 mg total) by mouth 2 (two) times daily., Disp: 90 tablet, Rfl: 3   nitroGLYCERIN  (NITROSTAT ) 0.4 MG SL tablet, Place 1 tablet (0.4 mg total) under the tongue every 5 (five) minutes as needed for chest pain., Disp: 25 tablet, Rfl: 3   rosuvastatin  (CRESTOR ) 10 MG tablet, Take 1 tablet (10 mg total) by mouth daily., Disp: 30 tablet, Rfl: 2 [2]  Allergies Allergen Reactions   Lipitor [Atorvastatin]     Muscle soreness

## 2024-07-22 ENCOUNTER — Ambulatory Visit: Payer: Self-pay | Admitting: Physician Assistant

## 2024-07-22 DIAGNOSIS — I6523 Occlusion and stenosis of bilateral carotid arteries: Secondary | ICD-10-CM

## 2024-07-23 ENCOUNTER — Inpatient Hospital Stay: Attending: Hematology

## 2024-07-23 ENCOUNTER — Other Ambulatory Visit: Payer: Self-pay

## 2024-07-23 ENCOUNTER — Inpatient Hospital Stay

## 2024-07-23 VITALS — BP 128/60 | HR 63 | Temp 98.2°F | Resp 17

## 2024-07-23 DIAGNOSIS — D649 Anemia, unspecified: Secondary | ICD-10-CM

## 2024-07-23 DIAGNOSIS — D469 Myelodysplastic syndrome, unspecified: Secondary | ICD-10-CM

## 2024-07-23 DIAGNOSIS — C679 Malignant neoplasm of bladder, unspecified: Secondary | ICD-10-CM

## 2024-07-23 LAB — CBC WITH DIFFERENTIAL/PLATELET
Abs Immature Granulocytes: 0.02 K/uL (ref 0.00–0.07)
Basophils Absolute: 0 K/uL (ref 0.0–0.1)
Basophils Relative: 0 %
Eosinophils Absolute: 0 K/uL (ref 0.0–0.5)
Eosinophils Relative: 1 %
HCT: 26.6 % — ABNORMAL LOW (ref 39.0–52.0)
Hemoglobin: 9 g/dL — ABNORMAL LOW (ref 13.0–17.0)
Immature Granulocytes: 0 %
Lymphocytes Relative: 36 %
Lymphs Abs: 1.8 K/uL (ref 0.7–4.0)
MCH: 34.5 pg — ABNORMAL HIGH (ref 26.0–34.0)
MCHC: 33.8 g/dL (ref 30.0–36.0)
MCV: 101.9 fL — ABNORMAL HIGH (ref 80.0–100.0)
Monocytes Absolute: 0.2 K/uL (ref 0.1–1.0)
Monocytes Relative: 5 %
Neutro Abs: 2.9 K/uL (ref 1.7–7.7)
Neutrophils Relative %: 58 %
Platelets: 86 K/uL — ABNORMAL LOW (ref 150–400)
RBC: 2.61 MIL/uL — ABNORMAL LOW (ref 4.22–5.81)
RDW: 17.9 % — ABNORMAL HIGH (ref 11.5–15.5)
WBC: 5 K/uL (ref 4.0–10.5)
nRBC: 0 % (ref 0.0–0.2)

## 2024-07-23 LAB — SAMPLE TO BLOOD BANK

## 2024-07-23 MED ORDER — EPOETIN ALFA-EPBX 10000 UNIT/ML IJ SOLN
10000.0000 [IU] | Freq: Once | INTRAMUSCULAR | Status: AC
  Administered 2024-07-23: 10000 [IU] via SUBCUTANEOUS
  Filled 2024-07-23: qty 1

## 2024-07-24 ENCOUNTER — Other Ambulatory Visit (HOSPITAL_COMMUNITY): Payer: Self-pay

## 2024-07-30 ENCOUNTER — Other Ambulatory Visit (HOSPITAL_COMMUNITY): Payer: Self-pay

## 2024-07-30 NOTE — Progress Notes (Signed)
 Paramedicine Encounter    Patient ID: Danny [name], male    DOB: Nov 27, 1933, 89 y.o.   MRN: [id number]   Complaints- none   Assessment- CAOx4, warm and dry, ambulatory no shortness of breath, no edema in lower legs, no abdominal swelling, lungs clear, vitals within normal limits.   Compliance with meds- no missed doses   Pill box filled- one week   Refills needed- none   Meds changes since last visit- none     Social changes- still needing transportation (going to look at a car this weekend)    VISIT SUMMARY- Arrived for home visit for [facility] who reports to be feeling well with no complaints today. Looks great, no swelling, no shortness of breath, lungs clear, no lower leg edema, vitals within normal for him. Meds reviewed and pill box filled for one week. No refills needed. Appointments reviewed and confirmed. HF education provided. I plan to see Danny [name] in one week.   BP 98/60   Pulse 62   Resp 16   Wt 125 lb (56.7 kg)   BMI 21.46 kg/m  Weight yesterday-- 125lbs  Last visit weight-- 128lbs      ACTION: Home visit completed     Patient Care Team: [name] LABOR, MD as PCP - General (Internal Medicine) Wendel Lurena POUR, MD as PCP - Structural Heart (Cardiology) Lanny Callander, MD as Consulting Physician (Hematology) Prentiss Heddy HERO, RN as [facility] Care Management  Patient Active Problem List   Diagnosis Date Noted   S/P TAVR (transcatheter aortic valve replacement) 04/04/2024   Palliative care by specialist 04/04/2024   Aortic stenosis, severe 04/04/2024   Pleural effusion on left 04/01/2024   Coronary artery disease 03/27/2024   Demand ischemia (HCC) 03/26/2024   Elevated troponin 03/26/2024   Acute kidney injury superimposed on chronic kidney disease 03/26/2024   Transaminitis 03/26/2024   Hyperkalemia 03/26/2024   History of bladder cancer 03/26/2024   Hyperlipidemia 03/26/2024   Generalized weakness 03/26/2024   Bradycardia with 31-40 beats per minute  10/10/2023   Bradycardia 10/10/2023   Scalp abrasion 01/11/2023   Bladder cancer (HCC) 01/11/2023   Coronary artery disease involving native coronary artery of native heart without angina pectoris 05/16/2022   Painless hematuria 01/19/2022   Onychomycosis 08/23/2021   Acute on chronic systolic CHF (congestive heart failure) (HCC) 08/23/2021   PAD (peripheral artery disease) 08/23/2021   Carotid artery disease 08/23/2021   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/2021   Aortic stenosis 07/30/2021   B12 deficiency 07/29/2021   MDS (myelodysplastic syndrome) (HCC) 07/28/2021   Macrocytic anemia 07/28/2021   Seizure-like activity (HCC) 07/27/2021   Generalized abdominal pain 09/30/2020   Loss of weight 09/30/2020   Chronic kidney disease, stage 3a (HCC) 11/21/2019   Transient alteration of awareness 07/25/2018   Syncope 07/25/2018   Hyperlipidemia LDL goal <70 05/28/2015   Medicare annual wellness visit, subsequent 04/20/2015   Erectile dysfunction 04/20/2015   Essential hypertension 04/20/2015   Current Medications[1] Allergies[2]   Social History   Socioeconomic History   Marital status: Married    Spouse name: Not on file   Number of children: 1   Years of education: 12   Highest education level: Not on file  Occupational History   Occupation: Retired  Tobacco Use   Smoking status: Former    Types: Cigars   Smokeless tobacco: Never   Tobacco comments:    Quit 2016  Vaping Use   Vaping status: Never Used  Substance and Sexual Activity  Alcohol use: Yes    Comment: Occasional beer   Drug use: No   Sexual activity: Not Currently  Other Topics Concern   Not on file  Social History Narrative   Lives with wife and daughter lives with them also/2025   Caffeine use: 1-2 cups coffee in the am and 1 glass tea qpm   Social Drivers of Health   Tobacco Use: Medium Risk (07/01/2024)   Patient History    Smoking Tobacco Use: Former    Smokeless Tobacco Use: Never    Passive Exposure: Not  on file  Financial Resource Strain: Medium Risk (11/23/2023)   Overall Financial Resource Strain (CARDIA)    Difficulty of Paying Living Expenses: Somewhat hard  Food Insecurity: No Food Insecurity (06/09/2024)   Epic    Worried About Programme Researcher, Broadcasting/film/video in the Last Year: Never true    Ran Out of Food in the Last Year: Never true  Transportation Needs: Unmet Transportation Needs (06/26/2024)   Epic    Lack of Transportation (Medical): Yes    Lack of Transportation (Non-Medical): Yes  Physical Activity: Inactive (11/23/2023)   Exercise Vital Sign    Days of Exercise per Week: 0 days    Minutes of Exercise per Session: 0 min  Stress: No Stress Concern Present (11/23/2023)   [employer] - Occupational Stress Questionnaire    Feeling of Stress : Only a little  Social Connections: Moderately Isolated (03/26/2024)   Social Connection and Isolation Panel    Frequency of Communication with Friends and Family: Once a week    Frequency of Social Gatherings with Friends and Family: Never    Attends Religious Services: 1 to 4 times per year    Active Member of Clubs or Organizations: No    Attends Banker Meetings: Never    Marital Status: Married  Catering Manager Violence: Not At Risk (06/09/2024)   Epic    Fear of Current or Ex-Partner: No    Emotionally Abused: No    Physically Abused: No    Sexually Abused: No  Depression (PHQ2-9): Low Risk (06/30/2024)   Depression (PHQ2-9)    PHQ-2 Score: 0  Alcohol Screen: Low Risk (11/23/2023)   Alcohol Screen    Last Alcohol Screening Score (AUDIT): 0  Housing: Low Risk (06/09/2024)   Epic    Unable to Pay for Housing in the Last Year: No    Number of Times Moved in the Last Year: 0    Homeless in the Last Year: No  Utilities: Not At Risk (06/09/2024)   Epic    Threatened with loss of utilities: No  Health Literacy: Adequate Health Literacy (11/23/2023)   B1300 Health Literacy    Frequency of need for  help with medical instructions: Never    Physical Exam      Future Appointments  Date Time Provider Department Center  08/07/2024 12:45 PM [facility] LAB [facility] None  08/07/2024  1:20 PM Lanny Callander, MD [facility]MED[facility]C None  08/07/2024  1:45 PM [facility] MED[facility] FLUSH [facility]MED[facility] None  11/27/2024 10:50 AM [facility]-ANNUAL WELLNESS VISIT [facility]  04/09/2025 12:50 PM HVC-ECHO 2 HVC-ECHO H&V  04/09/2025  2:20 PM Sebastian Lamarr SAUNDERS, PA-C CVD-MAGST H&V                 [1]  Current Outpatient Medications:    carvedilol  (COREG ) 3.125 MG tablet, Take 1 tablet (3.125 mg total) by mouth 2 (two) times daily with a meal., Disp: 60  tablet, Rfl: 2   furosemide  (LASIX ) 40 MG tablet, Take 1 tablet (40 mg total) by mouth 2 (two) times daily., Disp: 90 tablet, Rfl: 3   nitroGLYCERIN  (NITROSTAT ) 0.4 MG SL tablet, Place 1 tablet (0.4 mg total) under the tongue every 5 (five) minutes as needed for chest pain., Disp: 25 tablet, Rfl: 3   rosuvastatin  (CRESTOR ) 10 MG tablet, Take 1 tablet (10 mg total) by mouth daily., Disp: 30 tablet, Rfl: 2 [2]  Allergies Allergen Reactions   Lipitor [Atorvastatin]     Muscle soreness

## 2024-08-06 ENCOUNTER — Other Ambulatory Visit (HOSPITAL_COMMUNITY): Payer: Self-pay

## 2024-08-06 NOTE — Progress Notes (Signed)
 Paramedicine Encounter    Patient ID: Danny [name], male    DOB: 04/12/1934, 89 y.o.   MRN: [id number]   Complaints- blood in urine- X2 days   Compliance with meds- missed several night doses per pill box but says he took same out of the bottles.   Pill box filled- for one week   Refills needed- lasix , carvedilol    Meds changes since last visit- none     Social changes-none (still needing transportation)    VISIT SUMMARY- med rec only instructed to call urology reguarding blood in urine. No pain, no shortness of breath, no swelling.   There were no vitals taken for this visit.     ACTION: Home visit completed     Patient Care Team: [name] LABOR, MD as PCP - General (Internal Medicine) Wendel Lurena POUR, MD as PCP - Structural Heart (Cardiology) Lanny Callander, MD as Consulting Physician (Hematology) Prentiss Heddy HERO, RN as [facility] Care Management  Patient Active Problem List   Diagnosis Date Noted   S/P TAVR (transcatheter aortic valve replacement) 04/04/2024   Palliative care by specialist 04/04/2024   Aortic stenosis, severe 04/04/2024   Pleural effusion on left 04/01/2024   Coronary artery disease 03/27/2024   Demand ischemia (HCC) 03/26/2024   Elevated troponin 03/26/2024   Acute kidney injury superimposed on chronic kidney disease 03/26/2024   Transaminitis 03/26/2024   Hyperkalemia 03/26/2024   History of bladder cancer 03/26/2024   Hyperlipidemia 03/26/2024   Generalized weakness 03/26/2024   Bradycardia with 31-40 beats per minute 10/10/2023   Bradycardia 10/10/2023   Scalp abrasion 01/11/2023   Bladder cancer (HCC) 01/11/2023   Coronary artery disease involving native coronary artery of native heart without angina pectoris 05/16/2022   Painless hematuria 01/19/2022   Onychomycosis 08/23/2021   Acute on chronic systolic CHF (congestive heart failure) (HCC) 08/23/2021   PAD (peripheral artery disease) 08/23/2021   Carotid artery disease  08/23/2021   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/2021   Aortic stenosis 07/30/2021   B12 deficiency 07/29/2021   MDS (myelodysplastic syndrome) (HCC) 07/28/2021   Macrocytic anemia 07/28/2021   Seizure-like activity (HCC) 07/27/2021   Generalized abdominal pain 09/30/2020   Loss of weight 09/30/2020   Chronic kidney disease, stage 3a (HCC) 11/21/2019   Transient alteration of awareness 07/25/2018   Syncope 07/25/2018   Hyperlipidemia LDL goal <70 05/28/2015   Medicare annual wellness visit, subsequent 04/20/2015   Erectile dysfunction 04/20/2015   Essential hypertension 04/20/2015   Current Medications[1] Allergies[2]   Social History   Socioeconomic History   Marital status: Married    Spouse name: Not on file   Number of children: 1   Years of education: 12   Highest education level: Not on file  Occupational History   Occupation: Retired  Tobacco Use   Smoking status: Former    Types: Cigars   Smokeless tobacco: Never   Tobacco comments:    Quit 2016  Vaping Use   Vaping status: Never Used  Substance and Sexual Activity   Alcohol use: Yes    Comment: Occasional beer   Drug use: No   Sexual activity: Not Currently  Other Topics Concern   Not on file  Social History Narrative   Lives with wife and daughter lives with them also/2025   Caffeine use: 1-2 cups coffee in the am and 1 glass tea qpm   Social Drivers of Health   Tobacco Use: Medium Risk (07/01/2024)   Patient History    Smoking  Tobacco Use: Former    Smokeless Tobacco Use: Never    Passive Exposure: Not on file  Financial Resource Strain: Medium Risk (11/23/2023)   Overall Financial Resource Strain (CARDIA)    Difficulty of Paying Living Expenses: Somewhat hard  Food Insecurity: No Food Insecurity (06/09/2024)   Epic    Worried About Programme Researcher, Broadcasting/film/video in the Last Year: Never true    Ran Out of Food in the Last Year: Never true  Transportation Needs: Unmet Transportation Needs (06/26/2024)   Epic    Lack of  Transportation (Medical): Yes    Lack of Transportation (Non-Medical): Yes  Physical Activity: Inactive (11/23/2023)   Exercise Vital Sign    Days of Exercise per Week: 0 days    Minutes of Exercise per Session: 0 min  Stress: No Stress Concern Present (11/23/2023)   [employer] - Occupational Stress Questionnaire    Feeling of Stress : Only a little  Social Connections: Moderately Isolated (03/26/2024)   Social Connection and Isolation Panel    Frequency of Communication with Friends and Family: Once a week    Frequency of Social Gatherings with Friends and Family: Never    Attends Religious Services: 1 to 4 times per year    Active Member of [employer] or Organizations: No    Attends Banker Meetings: Never    Marital Status: Married  Catering Manager Violence: Not At Risk (06/09/2024)   Epic    Fear of Current or Ex-Partner: No    Emotionally Abused: No    Physically Abused: No    Sexually Abused: No  Depression (PHQ2-9): Low Risk (06/30/2024)   Depression (PHQ2-9)    PHQ-2 Score: 0  Alcohol Screen: Low Risk (11/23/2023)   Alcohol Screen    Last Alcohol Screening Score (AUDIT): 0  Housing: Low Risk (06/09/2024)   Epic    Unable to Pay for Housing in the Last Year: No    Number of Times Moved in the Last Year: 0    Homeless in the Last Year: No  Utilities: Not At Risk (06/09/2024)   Epic    Threatened with loss of utilities: No  Health Literacy: Adequate Health Literacy (11/23/2023)   B1300 Health Literacy    Frequency of need for help with medical instructions: Never    Physical Exam      Future Appointments  Date Time Provider Department Center  08/07/2024 12:45 PM [facility] None  08/07/2024  1:20 PM Lanny Callander, MD [facility] None  08/07/2024  1:45 PM [facility] FLUSH [facility] None  11/27/2024 10:50 AM [facility]-ANNUAL WELLNESS VISIT [facility]  04/09/2025 12:50 PM [facility] 2 [facility] H&V  04/09/2025   2:20 PM Sebastian Lamarr SAUNDERS, PA-C [facility] H&V                  [1]  Current Outpatient Medications:    carvedilol  (COREG ) 3.125 MG tablet, Take 1 tablet (3.125 mg total) by mouth 2 (two) times daily with a meal., Disp: 60 tablet, Rfl: 2   furosemide  (LASIX ) 40 MG tablet, Take 1 tablet (40 mg total) by mouth 2 (two) times daily., Disp: 90 tablet, Rfl: 3   nitroGLYCERIN  (NITROSTAT ) 0.4 MG SL tablet, Place 1 tablet (0.4 mg total) under the tongue every 5 (five) minutes as needed for chest pain., Disp: 25 tablet, Rfl: 3   rosuvastatin  (CRESTOR ) 10 MG tablet, Take 1 tablet (10 mg total) by mouth daily., Disp: 30 tablet, Rfl: 2 [  2]  Allergies Allergen Reactions   Lipitor [Atorvastatin]     Muscle soreness

## 2024-08-07 ENCOUNTER — Telehealth: Payer: Self-pay

## 2024-08-07 ENCOUNTER — Inpatient Hospital Stay

## 2024-08-07 ENCOUNTER — Inpatient Hospital Stay: Admitting: Hematology

## 2024-08-07 NOTE — Assessment & Plan Note (Addendum)
 MDS, high risk, IPSS-R 5.0 -pt presented with anemia with Hg in 7's and thrombocytopenia with platelet in 30-70 K range since early 2025, previous had mild anemia for several years.  Required blood transfusion -BM biopsy on 06/03/2024 showed normocellular bone marrow (15%) with trilineage hematopoiesis.  No significant dysplasia is identified in any of the lineages.  Blasts appear minimally increased (approximately 5%).  - Cytogenetics was normal.  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 -I recommend Aazcitadine and also discussed option of ESA.  Given his advanced age, will let him try ESA first. He started on 07/09/2024

## 2024-08-07 NOTE — Telephone Encounter (Signed)
 Contacted patient via telephone call @T #[phone] regarding missed appointment. Spoke with patient's daughter, [name]. Was advised that she has been trying to call to cancel today's appt, and [name] stated she left a vmail w/ details on someone's vmail (name unknown). Let [name] know I would forward her message to our scheduler reqesting a c/b to get today's appts rescheduled. [name] voiced understanding.

## 2024-08-08 ENCOUNTER — Other Ambulatory Visit (HOSPITAL_COMMUNITY): Payer: Self-pay

## 2024-08-08 ENCOUNTER — Other Ambulatory Visit: Payer: Self-pay

## 2024-08-15 ENCOUNTER — Other Ambulatory Visit (HOSPITAL_COMMUNITY): Payer: Self-pay

## 2024-08-15 NOTE — Progress Notes (Signed)
 Paramedicine Encounter    Patient ID: Danny [name], male    DOB: 12/08/1933, 89 y.o.   MRN: [id number]   Complaints- none   Assessment- CAOX4, warm and dry ambulatory without shortness of breath, no chest pain, no dizziness, no swelling, lungs clear, vitals stable.   Compliance with meds- no missed doses   Pill box filled- for one week   Refills needed- none   Meds changes since last visit- none     Social changes- I wrote down numbers for cancer center, HF clinic and urology for him to make appointments for follow ups    VISIT SUMMARY- Arrived for home visit for Danny [name] who reports to be feeling well with no complaints today. Vitals and assessment obtained. He denied any chest pain, shortness of breath, dizziness or swelling. No edema noted. Vitals stable. Weight stable. No missed meds. He has follow ups he needs to schedule- I provided numbers for same. He plans to call Monday to schedule these. I will follow up in one week on Thursday. He agreed with plan. Home visit complete.   BP 110/60   Pulse 64   Resp 16   Wt 126 lb 9.6 oz (57.4 kg)   SpO2 94%   BMI 21.73 kg/m  Weight yesterday-- 125.7lbs  Last visit weight-- 125lbs     ACTION: Home visit completed     Patient Care Team: [name] LABOR, MD as PCP - General (Internal Medicine) Wendel Lurena POUR, MD as PCP - Structural Heart (Cardiology) Lanny Callander, MD as Consulting Physician (Hematology) Prentiss Heddy HERO, RN as [facility] Care Management  Patient Active Problem List   Diagnosis Date Noted   S/P TAVR (transcatheter aortic valve replacement) 04/04/2024   Palliative care by specialist 04/04/2024   Aortic stenosis, severe 04/04/2024   Pleural effusion on left 04/01/2024   Coronary artery disease 03/27/2024   Demand ischemia (HCC) 03/26/2024   Elevated troponin 03/26/2024   Acute kidney injury superimposed on chronic kidney disease 03/26/2024   Transaminitis 03/26/2024   Hyperkalemia 03/26/2024    History of bladder cancer 03/26/2024   Hyperlipidemia 03/26/2024   Generalized weakness 03/26/2024   Bradycardia with 31-40 beats per minute 10/10/2023   Bradycardia 10/10/2023   Scalp abrasion 01/11/2023   Bladder cancer (HCC) 01/11/2023   Coronary artery disease involving native coronary artery of native heart without angina pectoris 05/16/2022   Painless hematuria 01/19/2022   Onychomycosis 08/23/2021   Acute on chronic systolic CHF (congestive heart failure) (HCC) 08/23/2021   PAD (peripheral artery disease) 08/23/2021   Carotid artery disease 08/23/2021   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/2021   Aortic stenosis 07/30/2021   B12 deficiency 07/29/2021   MDS (myelodysplastic syndrome) (HCC) 07/28/2021   Macrocytic anemia 07/28/2021   Seizure-like activity (HCC) 07/27/2021   Generalized abdominal pain 09/30/2020   Loss of weight 09/30/2020   Chronic kidney disease, stage 3a (HCC) 11/21/2019   Transient alteration of awareness 07/25/2018   Syncope 07/25/2018   Hyperlipidemia LDL goal <70 05/28/2015   Medicare annual wellness visit, subsequent 04/20/2015   Erectile dysfunction 04/20/2015   Essential hypertension 04/20/2015   Current Medications[1] Allergies[2]   Social History   Socioeconomic History   Marital status: Married    Spouse name: Not on file   Number of children: 1   Years of education: 12   Highest education level: Not on file  Occupational History   Occupation: Retired  Tobacco Use   Smoking status: Former    Types: Software Engineer  Smokeless tobacco: Never   Tobacco comments:    Quit 2016  Vaping Use   Vaping status: Never Used  Substance and Sexual Activity   Alcohol use: Yes    Comment: Occasional beer   Drug use: No   Sexual activity: Not Currently  Other Topics Concern   Not on file  Social History Narrative   Lives with wife and daughter lives with them also/2025   Caffeine use: 1-2 cups coffee in the am and 1 glass tea qpm   Social Drivers of Health    Tobacco Use: Medium Risk (07/01/2024)   Patient History    Smoking Tobacco Use: Former    Smokeless Tobacco Use: Never    Passive Exposure: Not on file  Financial Resource Strain: Medium Risk (11/23/2023)   Overall Financial Resource Strain (CARDIA)    Difficulty of Paying Living Expenses: Somewhat hard  Food Insecurity: No Food Insecurity (06/09/2024)   Epic    Worried About Programme Researcher, Broadcasting/film/video in the Last Year: Never true    Ran Out of Food in the Last Year: Never true  Transportation Needs: Unmet Transportation Needs (06/26/2024)   Epic    Lack of Transportation (Medical): Yes    Lack of Transportation (Non-Medical): Yes  Physical Activity: Inactive (11/23/2023)   Exercise Vital Sign    Days of Exercise per Week: 0 days    Minutes of Exercise per Session: 0 min  Stress: No Stress Concern Present (11/23/2023)   [employer] - Occupational Stress Questionnaire    Feeling of Stress : Only a little  Social Connections: Moderately Isolated (03/26/2024)   Social Connection and Isolation Panel    Frequency of Communication with Friends and Family: Once a week    Frequency of Social Gatherings with Friends and Family: Never    Attends Religious Services: 1 to 4 times per year    Active Member of Clubs or Organizations: No    Attends Banker Meetings: Never    Marital Status: Married  Catering Manager Violence: Not At Risk (06/09/2024)   Epic    Fear of Current or Ex-Partner: No    Emotionally Abused: No    Physically Abused: No    Sexually Abused: No  Depression (PHQ2-9): Low Risk (06/30/2024)   Depression (PHQ2-9)    PHQ-2 Score: 0  Alcohol Screen: Low Risk (11/23/2023)   Alcohol Screen    Last Alcohol Screening Score (AUDIT): 0  Housing: Low Risk (06/09/2024)   Epic    Unable to Pay for Housing in the Last Year: No    Number of Times Moved in the Last Year: 0    Homeless in the Last Year: No  Utilities: Not At Risk (06/09/2024)   Epic     Threatened with loss of utilities: No  Health Literacy: Adequate Health Literacy (11/23/2023)   B1300 Health Literacy    Frequency of need for help with medical instructions: Never    Physical Exam      Future Appointments  Date Time Provider Department Center  11/27/2024 10:50 AM [facility]-ANNUAL WELLNESS VISIT [facility]  04/09/2025 12:50 PM [facility]-ECHO 2 [facility]-ECHO H&V  04/09/2025  2:20 PM Sebastian Lamarr SAUNDERS, PA-C [facility] H&V                 [1]  Current Outpatient Medications:    carvedilol  (COREG ) 3.125 MG tablet, Take 1 tablet (3.125 mg total) by mouth 2 (two) times daily with a meal., Disp: 60  tablet, Rfl: 2   furosemide  (LASIX ) 40 MG tablet, Take 1 tablet (40 mg total) by mouth 2 (two) times daily., Disp: 90 tablet, Rfl: 3   nitroGLYCERIN  (NITROSTAT ) 0.4 MG SL tablet, Place 1 tablet (0.4 mg total) under the tongue every 5 (five) minutes as needed for chest pain., Disp: 25 tablet, Rfl: 3   rosuvastatin  (CRESTOR ) 10 MG tablet, Take 1 tablet (10 mg total) by mouth daily., Disp: 30 tablet, Rfl: 2 [2]  Allergies Allergen Reactions   Lipitor [Atorvastatin]     Muscle soreness

## 2024-11-27 ENCOUNTER — Ambulatory Visit

## 2025-04-09 ENCOUNTER — Ambulatory Visit: Admitting: Physician Assistant

## 2025-04-09 ENCOUNTER — Ambulatory Visit (HOSPITAL_COMMUNITY)
